# Patient Record
Sex: Female | Born: 1955 | Race: White | Hispanic: No | Smoking: Former smoker
Health system: Southern US, Community
[De-identification: ages and names within clinical notes are randomized; demographics above are authoritative.]

## PROBLEM LIST (undated history)

## (undated) DIAGNOSIS — K56609 Unspecified intestinal obstruction, unspecified as to partial versus complete obstruction: Secondary | ICD-10-CM

## (undated) DIAGNOSIS — F329 Major depressive disorder, single episode, unspecified: Secondary | ICD-10-CM

## (undated) DIAGNOSIS — G629 Polyneuropathy, unspecified: Secondary | ICD-10-CM

## (undated) DIAGNOSIS — K649 Unspecified hemorrhoids: Secondary | ICD-10-CM

## (undated) DIAGNOSIS — F319 Bipolar disorder, unspecified: Secondary | ICD-10-CM

## (undated) DIAGNOSIS — M4800 Spinal stenosis, site unspecified: Secondary | ICD-10-CM

## (undated) DIAGNOSIS — I82629 Acute embolism and thrombosis of deep veins of unspecified upper extremity: Secondary | ICD-10-CM

## (undated) DIAGNOSIS — E039 Hypothyroidism, unspecified: Secondary | ICD-10-CM

## (undated) DIAGNOSIS — M51369 Other intervertebral disc degeneration, lumbar region without mention of lumbar back pain or lower extremity pain: Secondary | ICD-10-CM

## (undated) DIAGNOSIS — E041 Nontoxic single thyroid nodule: Secondary | ICD-10-CM

## (undated) DIAGNOSIS — C210 Malignant neoplasm of anus, unspecified: Secondary | ICD-10-CM

## (undated) DIAGNOSIS — IMO0001 Reserved for inherently not codable concepts without codable children: Secondary | ICD-10-CM

## (undated) DIAGNOSIS — R251 Tremor, unspecified: Secondary | ICD-10-CM

## (undated) DIAGNOSIS — M5136 Other intervertebral disc degeneration, lumbar region: Secondary | ICD-10-CM

## (undated) DIAGNOSIS — I82409 Acute embolism and thrombosis of unspecified deep veins of unspecified lower extremity: Secondary | ICD-10-CM

## (undated) DIAGNOSIS — J45909 Unspecified asthma, uncomplicated: Secondary | ICD-10-CM

## (undated) DIAGNOSIS — F419 Anxiety disorder, unspecified: Secondary | ICD-10-CM

## (undated) DIAGNOSIS — D649 Anemia, unspecified: Secondary | ICD-10-CM

## (undated) DIAGNOSIS — G4733 Obstructive sleep apnea (adult) (pediatric): Secondary | ICD-10-CM

## (undated) DIAGNOSIS — J189 Pneumonia, unspecified organism: Secondary | ICD-10-CM

## (undated) DIAGNOSIS — F209 Schizophrenia, unspecified: Secondary | ICD-10-CM

## (undated) DIAGNOSIS — M797 Fibromyalgia: Secondary | ICD-10-CM

## (undated) DIAGNOSIS — I10 Essential (primary) hypertension: Secondary | ICD-10-CM

## (undated) DIAGNOSIS — K219 Gastro-esophageal reflux disease without esophagitis: Secondary | ICD-10-CM

## (undated) DIAGNOSIS — J449 Chronic obstructive pulmonary disease, unspecified: Secondary | ICD-10-CM

## (undated) DIAGNOSIS — F32A Depression, unspecified: Secondary | ICD-10-CM

## (undated) DIAGNOSIS — M75100 Unspecified rotator cuff tear or rupture of unspecified shoulder, not specified as traumatic: Secondary | ICD-10-CM

## (undated) DIAGNOSIS — F4310 Post-traumatic stress disorder, unspecified: Secondary | ICD-10-CM

## (undated) DIAGNOSIS — C3490 Malignant neoplasm of unspecified part of unspecified bronchus or lung: Secondary | ICD-10-CM

## (undated) HISTORY — DX: Fibromyalgia: M79.7

## (undated) HISTORY — DX: Unspecified intestinal obstruction, unspecified as to partial versus complete obstruction: K56.609

## (undated) HISTORY — DX: Spinal stenosis, site unspecified: M48.00

## (undated) HISTORY — DX: Schizophrenia, unspecified: F20.9

## (undated) HISTORY — DX: Pneumonia, unspecified organism: J18.9

## (undated) HISTORY — DX: Post-traumatic stress disorder, unspecified: F43.10

## (undated) HISTORY — DX: Chronic obstructive pulmonary disease, unspecified: J44.9

## (undated) HISTORY — DX: Bipolar disorder, unspecified: F31.9

## (undated) HISTORY — DX: Depression, unspecified: F32.A

## (undated) HISTORY — DX: Obstructive sleep apnea (adult) (pediatric): G47.33

## (undated) HISTORY — DX: Essential (primary) hypertension: I10

## (undated) HISTORY — DX: Anxiety disorder, unspecified: F41.9

## (undated) HISTORY — DX: Hypothyroidism, unspecified: E03.9

## (undated) HISTORY — DX: Anemia, unspecified: D64.9

## (undated) HISTORY — DX: Polyneuropathy, unspecified: G62.9

## (undated) HISTORY — DX: Gastro-esophageal reflux disease without esophagitis: K21.9

## (undated) HISTORY — DX: Nontoxic single thyroid nodule: E04.1

## (undated) HISTORY — DX: Other intervertebral disc degeneration, lumbar region without mention of lumbar back pain or lower extremity pain: M51.369

## (undated) HISTORY — DX: Malignant neoplasm of unspecified part of unspecified bronchus or lung: C34.90

## (undated) HISTORY — DX: Other intervertebral disc degeneration, lumbar region: M51.36

## (undated) HISTORY — DX: Major depressive disorder, single episode, unspecified: F32.9

## (undated) HISTORY — PX: EYE SURGERY: SHX253

## (undated) HISTORY — DX: Reserved for inherently not codable concepts without codable children: IMO0001

## (undated) HISTORY — PX: FOOT SURGERY: SHX648

## (undated) HISTORY — PX: TUBAL LIGATION: SHX77

## (undated) HISTORY — DX: Acute embolism and thrombosis of unspecified deep veins of unspecified lower extremity: I82.409

## (undated) HISTORY — DX: Unspecified hemorrhoids: K64.9

## (undated) HISTORY — DX: Unspecified asthma, uncomplicated: J45.909

## (undated) HISTORY — DX: Unspecified rotator cuff tear or rupture of unspecified shoulder, not specified as traumatic: M75.100

## (undated) HISTORY — DX: Tremor, unspecified: R25.1

---

## 2000-01-07 HISTORY — PX: MOUTH SURGERY: SHX715

## 2004-01-30 ENCOUNTER — Emergency Department: Payer: Self-pay | Admitting: Emergency Medicine

## 2004-03-24 ENCOUNTER — Emergency Department: Payer: Self-pay | Admitting: Emergency Medicine

## 2004-05-02 ENCOUNTER — Emergency Department: Payer: Self-pay | Admitting: Unknown Physician Specialty

## 2004-05-17 ENCOUNTER — Emergency Department: Payer: Self-pay | Admitting: Emergency Medicine

## 2004-06-23 ENCOUNTER — Emergency Department: Payer: Self-pay | Admitting: Emergency Medicine

## 2004-08-27 ENCOUNTER — Ambulatory Visit: Payer: Self-pay | Admitting: Unknown Physician Specialty

## 2004-09-11 ENCOUNTER — Ambulatory Visit: Payer: Self-pay

## 2004-09-15 ENCOUNTER — Emergency Department: Payer: Self-pay | Admitting: Unknown Physician Specialty

## 2004-09-23 ENCOUNTER — Emergency Department: Payer: Self-pay | Admitting: Emergency Medicine

## 2004-10-26 ENCOUNTER — Emergency Department: Payer: Self-pay | Admitting: Emergency Medicine

## 2004-11-26 ENCOUNTER — Emergency Department: Payer: Self-pay | Admitting: Emergency Medicine

## 2004-12-26 ENCOUNTER — Emergency Department: Payer: Self-pay | Admitting: Emergency Medicine

## 2005-01-20 ENCOUNTER — Emergency Department: Payer: Self-pay | Admitting: Emergency Medicine

## 2005-03-22 ENCOUNTER — Emergency Department: Payer: Self-pay | Admitting: Emergency Medicine

## 2005-04-05 ENCOUNTER — Emergency Department: Payer: Self-pay | Admitting: Unknown Physician Specialty

## 2005-04-22 ENCOUNTER — Emergency Department: Payer: Self-pay | Admitting: Emergency Medicine

## 2005-07-01 ENCOUNTER — Emergency Department: Payer: Self-pay | Admitting: Internal Medicine

## 2005-11-09 ENCOUNTER — Emergency Department: Payer: Self-pay | Admitting: Emergency Medicine

## 2005-11-22 ENCOUNTER — Emergency Department: Payer: Self-pay | Admitting: General Practice

## 2005-11-29 ENCOUNTER — Emergency Department: Payer: Self-pay | Admitting: Emergency Medicine

## 2005-12-15 ENCOUNTER — Ambulatory Visit: Payer: Self-pay | Admitting: Unknown Physician Specialty

## 2005-12-30 ENCOUNTER — Emergency Department: Payer: Self-pay | Admitting: General Practice

## 2006-01-28 ENCOUNTER — Emergency Department: Payer: Self-pay | Admitting: Internal Medicine

## 2007-02-05 ENCOUNTER — Emergency Department (HOSPITAL_COMMUNITY): Admission: EM | Admit: 2007-02-05 | Discharge: 2007-02-05 | Payer: Self-pay | Admitting: Emergency Medicine

## 2007-05-06 ENCOUNTER — Other Ambulatory Visit: Payer: Self-pay

## 2007-05-06 ENCOUNTER — Emergency Department: Payer: Self-pay | Admitting: Emergency Medicine

## 2007-06-23 ENCOUNTER — Emergency Department: Payer: Self-pay | Admitting: Emergency Medicine

## 2007-09-13 ENCOUNTER — Emergency Department: Payer: Self-pay | Admitting: Emergency Medicine

## 2007-09-21 ENCOUNTER — Emergency Department: Payer: Self-pay | Admitting: Emergency Medicine

## 2007-10-30 ENCOUNTER — Emergency Department: Payer: Self-pay | Admitting: Emergency Medicine

## 2008-01-04 ENCOUNTER — Emergency Department: Payer: Self-pay | Admitting: Emergency Medicine

## 2008-01-26 ENCOUNTER — Emergency Department: Payer: Self-pay | Admitting: Unknown Physician Specialty

## 2008-06-23 ENCOUNTER — Emergency Department: Payer: Self-pay | Admitting: Emergency Medicine

## 2008-06-29 ENCOUNTER — Emergency Department: Payer: Self-pay | Admitting: Emergency Medicine

## 2008-07-17 ENCOUNTER — Emergency Department: Payer: Self-pay | Admitting: Emergency Medicine

## 2008-08-11 ENCOUNTER — Emergency Department: Payer: Self-pay | Admitting: Emergency Medicine

## 2008-09-11 ENCOUNTER — Emergency Department: Payer: Self-pay | Admitting: Emergency Medicine

## 2008-12-11 ENCOUNTER — Emergency Department: Payer: Self-pay | Admitting: Emergency Medicine

## 2009-01-06 DIAGNOSIS — J189 Pneumonia, unspecified organism: Secondary | ICD-10-CM

## 2009-01-06 DIAGNOSIS — I82409 Acute embolism and thrombosis of unspecified deep veins of unspecified lower extremity: Secondary | ICD-10-CM

## 2009-01-06 HISTORY — DX: Acute embolism and thrombosis of unspecified deep veins of unspecified lower extremity: I82.409

## 2009-01-06 HISTORY — DX: Pneumonia, unspecified organism: J18.9

## 2009-01-11 ENCOUNTER — Inpatient Hospital Stay: Payer: Self-pay | Admitting: Psychiatry

## 2009-02-12 ENCOUNTER — Emergency Department: Payer: Self-pay | Admitting: Emergency Medicine

## 2009-02-14 ENCOUNTER — Emergency Department: Payer: Self-pay | Admitting: Emergency Medicine

## 2009-03-07 ENCOUNTER — Emergency Department: Payer: Self-pay | Admitting: Emergency Medicine

## 2009-04-06 ENCOUNTER — Ambulatory Visit: Payer: Self-pay | Admitting: Oncology

## 2009-04-24 ENCOUNTER — Inpatient Hospital Stay: Payer: Self-pay | Admitting: Internal Medicine

## 2009-04-28 ENCOUNTER — Ambulatory Visit: Payer: Self-pay | Admitting: Oncology

## 2009-05-03 ENCOUNTER — Ambulatory Visit: Payer: Self-pay | Admitting: Oncology

## 2009-05-06 ENCOUNTER — Ambulatory Visit: Payer: Self-pay | Admitting: Oncology

## 2009-06-06 ENCOUNTER — Ambulatory Visit: Payer: Self-pay | Admitting: Oncology

## 2009-07-06 ENCOUNTER — Emergency Department: Payer: Self-pay | Admitting: Emergency Medicine

## 2009-07-06 ENCOUNTER — Ambulatory Visit: Payer: Self-pay | Admitting: Oncology

## 2009-07-16 ENCOUNTER — Inpatient Hospital Stay: Payer: Self-pay | Admitting: Unknown Physician Specialty

## 2009-08-24 ENCOUNTER — Emergency Department: Payer: Self-pay | Admitting: Emergency Medicine

## 2009-08-30 ENCOUNTER — Emergency Department: Payer: Self-pay | Admitting: Emergency Medicine

## 2009-09-12 ENCOUNTER — Inpatient Hospital Stay: Payer: Self-pay | Admitting: Internal Medicine

## 2009-10-09 ENCOUNTER — Emergency Department: Payer: Self-pay | Admitting: Internal Medicine

## 2009-10-17 ENCOUNTER — Emergency Department: Payer: Self-pay | Admitting: Emergency Medicine

## 2009-11-04 ENCOUNTER — Emergency Department: Payer: Self-pay | Admitting: Emergency Medicine

## 2009-11-06 ENCOUNTER — Ambulatory Visit: Payer: Self-pay | Admitting: Internal Medicine

## 2009-11-09 ENCOUNTER — Inpatient Hospital Stay: Payer: Self-pay | Admitting: Psychiatry

## 2009-11-14 LAB — PROT IMMUNOELECTROPHORES(ARMC)

## 2009-11-16 ENCOUNTER — Emergency Department: Payer: Self-pay | Admitting: Emergency Medicine

## 2009-11-29 ENCOUNTER — Inpatient Hospital Stay: Payer: Self-pay | Admitting: Specialist

## 2010-01-06 ENCOUNTER — Ambulatory Visit: Payer: Self-pay | Admitting: Internal Medicine

## 2010-01-08 ENCOUNTER — Emergency Department: Payer: Self-pay | Admitting: Emergency Medicine

## 2010-01-29 ENCOUNTER — Emergency Department: Payer: Self-pay | Admitting: Emergency Medicine

## 2010-03-02 ENCOUNTER — Inpatient Hospital Stay: Payer: Self-pay | Admitting: Internal Medicine

## 2010-05-20 ENCOUNTER — Emergency Department: Payer: Self-pay | Admitting: Emergency Medicine

## 2010-07-28 ENCOUNTER — Inpatient Hospital Stay: Payer: Self-pay | Admitting: Internal Medicine

## 2010-07-30 ENCOUNTER — Inpatient Hospital Stay: Payer: Self-pay | Admitting: Unknown Physician Specialty

## 2010-08-27 ENCOUNTER — Emergency Department: Payer: Self-pay | Admitting: Emergency Medicine

## 2010-09-07 ENCOUNTER — Inpatient Hospital Stay: Payer: Self-pay | Admitting: Internal Medicine

## 2010-10-11 ENCOUNTER — Emergency Department: Payer: Self-pay | Admitting: Emergency Medicine

## 2010-11-11 ENCOUNTER — Emergency Department: Payer: Self-pay | Admitting: Emergency Medicine

## 2010-11-28 ENCOUNTER — Inpatient Hospital Stay: Payer: Self-pay | Admitting: Internal Medicine

## 2010-12-11 ENCOUNTER — Emergency Department: Payer: Self-pay | Admitting: Emergency Medicine

## 2011-03-16 ENCOUNTER — Emergency Department: Payer: Self-pay | Admitting: *Deleted

## 2011-03-16 LAB — CBC
HCT: 42.4 % (ref 35.0–47.0)
HGB: 14.4 g/dL (ref 12.0–16.0)
MCH: 30.5 pg (ref 26.0–34.0)
MCHC: 33.9 g/dL (ref 32.0–36.0)
MCV: 90 fL (ref 80–100)
Platelet: 192 10*3/uL (ref 150–440)
RDW: 14.4 % (ref 11.5–14.5)

## 2011-03-16 LAB — COMPREHENSIVE METABOLIC PANEL
Albumin: 3.4 g/dL (ref 3.4–5.0)
Alkaline Phosphatase: 68 U/L (ref 50–136)
BUN: 8 mg/dL (ref 7–18)
Bilirubin,Total: 0.6 mg/dL (ref 0.2–1.0)
Chloride: 105 mmol/L (ref 98–107)
Co2: 26 mmol/L (ref 21–32)
Creatinine: 0.92 mg/dL (ref 0.60–1.30)
EGFR (Non-African Amer.): >60
Osmolality: 283 (ref 275–301)
SGPT (ALT): 15 U/L
Sodium: 143 mmol/L (ref 136–145)

## 2011-03-16 LAB — URINALYSIS, COMPLETE
Bilirubin,UR: NEGATIVE
Ketone: NEGATIVE
Ph: 5 (ref 4.5–8.0)
Specific Gravity: 1.02 (ref 1.003–1.030)
Squamous Epithelial: <1

## 2011-03-25 ENCOUNTER — Emergency Department: Payer: Self-pay | Admitting: Emergency Medicine

## 2011-04-12 ENCOUNTER — Inpatient Hospital Stay: Payer: Self-pay | Admitting: Internal Medicine

## 2011-04-12 LAB — COMPREHENSIVE METABOLIC PANEL
Albumin: 3.2 g/dL — ABNORMAL LOW (ref 3.4–5.0)
Chloride: 109 mmol/L — ABNORMAL HIGH (ref 98–107)
Creatinine: 0.84 mg/dL (ref 0.60–1.30)
Osmolality: 280 (ref 275–301)
Potassium: 3.7 mmol/L (ref 3.5–5.1)
SGOT(AST): 30 U/L (ref 15–37)
Sodium: 141 mmol/L (ref 136–145)
Total Protein: 6.9 g/dL (ref 6.4–8.2)

## 2011-04-12 LAB — CBC
HCT: 44.6 % (ref 35.0–47.0)
HGB: 14.8 g/dL (ref 12.0–16.0)
MCH: 30 pg (ref 26.0–34.0)
MCHC: 33.1 g/dL (ref 32.0–36.0)
RDW: 14.2 % (ref 11.5–14.5)
WBC: 9.1 10*3/uL (ref 3.6–11.0)

## 2011-04-12 LAB — DRUG SCREEN, URINE
Amphetamines, Ur Screen: NEGATIVE (ref ?–1000)
Barbiturates, Ur Screen: NEGATIVE (ref ?–200)
Cocaine Metabolite,Ur ~~LOC~~: NEGATIVE (ref ?–300)
MDMA (Ecstasy)Ur Screen: NEGATIVE (ref ?–500)
Methadone, Ur Screen: NEGATIVE (ref ?–300)
Phencyclidine (PCP) Ur S: NEGATIVE (ref ?–25)
Tricyclic, Ur Screen: NEGATIVE (ref ?–1000)

## 2011-04-13 LAB — BASIC METABOLIC PANEL
Calcium, Total: 8.5 mg/dL (ref 8.5–10.1)
Co2: 27 mmol/L (ref 21–32)
Creatinine: 0.87 mg/dL (ref 0.60–1.30)
Glucose: 191 mg/dL — ABNORMAL HIGH (ref 65–99)
Osmolality: 278 (ref 275–301)
Sodium: 137 mmol/L (ref 136–145)

## 2011-04-13 LAB — CBC WITH DIFFERENTIAL/PLATELET
Basophil #: 0 10*3/uL (ref 0.0–0.1)
Basophil %: 0.3 %
Eosinophil #: 0 10*3/uL (ref 0.0–0.7)
MCH: 30 pg (ref 26.0–34.0)
MCHC: 33 g/dL (ref 32.0–36.0)
MCV: 91 fL (ref 80–100)
Monocyte #: 0.2 10*3/uL (ref 0.0–0.7)
Monocyte %: 2.1 %
Neutrophil #: 9.6 10*3/uL — ABNORMAL HIGH (ref 1.4–6.5)
WBC: 11.5 10*3/uL — ABNORMAL HIGH (ref 3.6–11.0)

## 2011-04-15 LAB — HEMOGLOBIN A1C

## 2011-05-29 ENCOUNTER — Ambulatory Visit: Payer: Self-pay | Admitting: Family Medicine

## 2011-07-02 ENCOUNTER — Encounter: Payer: Self-pay | Admitting: Family Medicine

## 2011-07-22 ENCOUNTER — Emergency Department: Payer: Self-pay | Admitting: Emergency Medicine

## 2011-10-17 ENCOUNTER — Emergency Department: Payer: Self-pay | Admitting: Emergency Medicine

## 2011-10-17 LAB — URINALYSIS, COMPLETE
Bilirubin,UR: NEGATIVE
Ph: 7 (ref 4.5–8.0)
RBC,UR: 8 /HPF (ref 0–5)
Specific Gravity: 1.021 (ref 1.003–1.030)
Squamous Epithelial: 4
WBC UR: 3 /HPF (ref 0–5)

## 2011-10-17 LAB — CBC
HCT: 47.5 % — ABNORMAL HIGH (ref 35.0–47.0)
MCH: 31.2 pg (ref 26.0–34.0)
MCHC: 34.9 g/dL (ref 32.0–36.0)
MCV: 90 fL (ref 80–100)
RDW: 14.1 % (ref 11.5–14.5)

## 2011-10-17 LAB — COMPREHENSIVE METABOLIC PANEL
Alkaline Phosphatase: 92 U/L (ref 50–136)
Bilirubin,Total: 1.4 mg/dL — ABNORMAL HIGH (ref 0.2–1.0)
Co2: 27 mmol/L (ref 21–32)
EGFR (Non-African Amer.): >60
SGPT (ALT): 19 U/L (ref 12–78)

## 2011-11-04 ENCOUNTER — Emergency Department: Payer: Self-pay | Admitting: Emergency Medicine

## 2011-11-04 LAB — CBC
MCH: 31.3 pg (ref 26.0–34.0)
MCHC: 34.2 g/dL (ref 32.0–36.0)
MCV: 92 fL (ref 80–100)
Platelet: 229 10*3/uL (ref 150–440)
RBC: 4.79 10*6/uL (ref 3.80–5.20)

## 2011-11-04 LAB — URINALYSIS, COMPLETE
Bilirubin,UR: NEGATIVE
Blood: NEGATIVE
Glucose,UR: NEGATIVE mg/dL (ref 0–75)
Specific Gravity: 1.015 (ref 1.003–1.030)
Squamous Epithelial: 5
WBC UR: 3 /HPF (ref 0–5)

## 2011-11-05 LAB — COMPREHENSIVE METABOLIC PANEL
Anion Gap: 8 (ref 7–16)
BUN: 14 mg/dL (ref 7–18)
Bilirubin,Total: 0.4 mg/dL (ref 0.2–1.0)
Chloride: 106 mmol/L (ref 98–107)
Co2: 31 mmol/L (ref 21–32)
Creatinine: 0.94 mg/dL (ref 0.60–1.30)
EGFR (African American): >60
EGFR (Non-African Amer.): >60
Potassium: 4.1 mmol/L (ref 3.5–5.1)
SGOT(AST): 30 U/L (ref 15–37)
Total Protein: 6.8 g/dL (ref 6.4–8.2)

## 2012-03-25 ENCOUNTER — Ambulatory Visit: Payer: Self-pay | Admitting: Family Medicine

## 2012-05-25 ENCOUNTER — Encounter: Payer: Self-pay | Admitting: Family Medicine

## 2012-06-06 ENCOUNTER — Encounter: Payer: Self-pay | Admitting: Family Medicine

## 2012-09-02 LAB — COMPREHENSIVE METABOLIC PANEL
BUN: 8 mg/dL (ref 7–18)
Bilirubin,Total: 0.4 mg/dL (ref 0.2–1.0)
Calcium, Total: 9.1 mg/dL (ref 8.5–10.1)
Chloride: 105 mmol/L (ref 98–107)
Co2: 24 mmol/L (ref 21–32)
Creatinine: 0.95 mg/dL (ref 0.60–1.30)
EGFR (African American): >60
Osmolality: 271 (ref 275–301)
Potassium: 3.8 mmol/L (ref 3.5–5.1)
SGPT (ALT): 23 U/L (ref 12–78)

## 2012-09-02 LAB — CBC
HCT: 45 % (ref 35.0–47.0)
MCH: 30.8 pg (ref 26.0–34.0)
Platelet: 211 10*3/uL (ref 150–440)
RDW: 13.8 % (ref 11.5–14.5)
WBC: 11.3 10*3/uL — ABNORMAL HIGH (ref 3.6–11.0)

## 2012-09-02 LAB — URINALYSIS, COMPLETE
Bacteria: NONE SEEN
Bilirubin,UR: NEGATIVE
Glucose,UR: NEGATIVE mg/dL (ref 0–75)
Leukocyte Esterase: NEGATIVE
RBC,UR: 4 /HPF (ref 0–5)

## 2012-09-02 LAB — ETHANOL: Ethanol %: <0.003 % (ref 0.000–0.080)

## 2012-09-02 LAB — DRUG SCREEN, URINE
Cocaine Metabolite,Ur ~~LOC~~: NEGATIVE (ref ?–300)
Methadone, Ur Screen: NEGATIVE (ref ?–300)

## 2012-09-02 LAB — SALICYLATE LEVEL: Salicylates, Serum: 3.3 mg/dL — ABNORMAL HIGH

## 2012-09-03 ENCOUNTER — Inpatient Hospital Stay: Payer: Self-pay | Admitting: Psychiatry

## 2012-12-01 LAB — DRUG SCREEN, URINE
Amphetamines, Ur Screen: NEGATIVE (ref ?–1000)
Barbiturates, Ur Screen: NEGATIVE (ref ?–200)
Methadone, Ur Screen: NEGATIVE (ref ?–300)
Opiate, Ur Screen: NEGATIVE (ref ?–300)
Phencyclidine (PCP) Ur S: NEGATIVE (ref ?–25)

## 2012-12-01 LAB — URINALYSIS, COMPLETE
Bilirubin,UR: NEGATIVE
Ph: 6 (ref 4.5–8.0)
RBC,UR: 7 /HPF (ref 0–5)
Specific Gravity: 1.017 (ref 1.003–1.030)
Squamous Epithelial: 3
WBC UR: 7 /HPF (ref 0–5)

## 2012-12-01 LAB — CBC
HCT: 45.5 % (ref 35.0–47.0)
HGB: 15.4 g/dL (ref 12.0–16.0)
MCH: 30.5 pg (ref 26.0–34.0)
MCHC: 33.8 g/dL (ref 32.0–36.0)
MCV: 90 fL (ref 80–100)
Platelet: 203 10*3/uL (ref 150–440)
RDW: 13.7 % (ref 11.5–14.5)

## 2012-12-01 LAB — COMPREHENSIVE METABOLIC PANEL
Alkaline Phosphatase: 59 U/L
Co2: 26 mmol/L (ref 21–32)
Creatinine: 0.91 mg/dL (ref 0.60–1.30)
EGFR (Non-African Amer.): >60
Potassium: 3.8 mmol/L (ref 3.5–5.1)
Sodium: 138 mmol/L (ref 136–145)
Total Protein: 7.3 g/dL (ref 6.4–8.2)

## 2012-12-01 LAB — SALICYLATE LEVEL: Salicylates, Serum: 3.7 mg/dL — ABNORMAL HIGH

## 2012-12-01 LAB — ETHANOL: Ethanol %: <0.003 % (ref 0.000–0.080)

## 2012-12-02 ENCOUNTER — Inpatient Hospital Stay: Payer: Self-pay | Admitting: Psychiatry

## 2013-03-08 ENCOUNTER — Emergency Department: Payer: Self-pay | Admitting: Emergency Medicine

## 2013-04-29 ENCOUNTER — Ambulatory Visit: Payer: Self-pay | Admitting: Family Medicine

## 2013-05-01 ENCOUNTER — Emergency Department: Payer: Self-pay

## 2013-05-01 LAB — BASIC METABOLIC PANEL
Anion Gap: 8 (ref 7–16)
BUN: 13 mg/dL (ref 7–18)
CHLORIDE: 106 mmol/L (ref 98–107)
Calcium, Total: 9 mg/dL (ref 8.5–10.1)
Co2: 25 mmol/L (ref 21–32)
Creatinine: 0.89 mg/dL (ref 0.60–1.30)
EGFR (African American): >60
GLUCOSE: 111 mg/dL — AB (ref 65–99)
Osmolality: 278 (ref 275–301)
Potassium: 3.7 mmol/L (ref 3.5–5.1)
Sodium: 139 mmol/L (ref 136–145)

## 2013-05-01 LAB — CBC
HCT: 44.4 % (ref 35.0–47.0)
HGB: 14.9 g/dL (ref 12.0–16.0)
MCH: 30.4 pg (ref 26.0–34.0)
MCHC: 33.5 g/dL (ref 32.0–36.0)
MCV: 91 fL (ref 80–100)
Platelet: 204 10*3/uL (ref 150–440)
RBC: 4.89 10*6/uL (ref 3.80–5.20)
RDW: 13.6 % (ref 11.5–14.5)
WBC: 10.7 10*3/uL (ref 3.6–11.0)

## 2013-05-17 ENCOUNTER — Emergency Department: Payer: Self-pay | Admitting: Emergency Medicine

## 2013-06-19 ENCOUNTER — Inpatient Hospital Stay: Payer: Self-pay | Admitting: Psychiatry

## 2013-06-19 LAB — COMPREHENSIVE METABOLIC PANEL
Albumin: 3 g/dL — ABNORMAL LOW (ref 3.4–5.0)
Alkaline Phosphatase: 61 U/L
Anion Gap: 8 (ref 7–16)
BILIRUBIN TOTAL: 0.6 mg/dL (ref 0.2–1.0)
BUN: 10 mg/dL (ref 7–18)
CREATININE: 1.11 mg/dL (ref 0.60–1.30)
Calcium, Total: 8.6 mg/dL (ref 8.5–10.1)
Chloride: 106 mmol/L (ref 98–107)
Co2: 25 mmol/L (ref 21–32)
EGFR (African American): >60
EGFR (Non-African Amer.): 55 — ABNORMAL LOW
Glucose: 89 mg/dL (ref 65–99)
Osmolality: 276 (ref 275–301)
Potassium: 3.6 mmol/L (ref 3.5–5.1)
SGOT(AST): 27 U/L (ref 15–37)
SGPT (ALT): 16 U/L (ref 12–78)
Sodium: 139 mmol/L (ref 136–145)
Total Protein: 7.1 g/dL (ref 6.4–8.2)

## 2013-06-19 LAB — DRUG SCREEN, URINE
AMPHETAMINES, UR SCREEN: NEGATIVE (ref ?–1000)
BENZODIAZEPINE, UR SCRN: NEGATIVE (ref ?–200)
Barbiturates, Ur Screen: NEGATIVE (ref ?–200)
CANNABINOID 50 NG, UR ~~LOC~~: POSITIVE (ref ?–50)
COCAINE METABOLITE, UR ~~LOC~~: NEGATIVE (ref ?–300)
MDMA (Ecstasy)Ur Screen: NEGATIVE (ref ?–500)
METHADONE, UR SCREEN: NEGATIVE (ref ?–300)
OPIATE, UR SCREEN: NEGATIVE (ref ?–300)
PHENCYCLIDINE (PCP) UR S: NEGATIVE (ref ?–25)
Tricyclic, Ur Screen: NEGATIVE (ref ?–1000)

## 2013-06-19 LAB — URINALYSIS, COMPLETE
BACTERIA: NONE SEEN
Bilirubin,UR: NEGATIVE
Blood: NEGATIVE
GLUCOSE, UR: NEGATIVE mg/dL (ref 0–75)
Ketone: NEGATIVE
NITRITE: NEGATIVE
Ph: 6 (ref 4.5–8.0)
Protein: NEGATIVE
RBC,UR: 7 /HPF (ref 0–5)
Specific Gravity: 1.017 (ref 1.003–1.030)
WBC UR: 1 /HPF (ref 0–5)

## 2013-06-19 LAB — ETHANOL: Ethanol %: <0.003 % (ref 0.000–0.080)

## 2013-06-19 LAB — CBC
HCT: 43.3 % (ref 35.0–47.0)
HGB: 14.6 g/dL (ref 12.0–16.0)
MCH: 30.5 pg (ref 26.0–34.0)
MCHC: 33.7 g/dL (ref 32.0–36.0)
MCV: 91 fL (ref 80–100)
Platelet: 205 10*3/uL (ref 150–440)
RBC: 4.78 10*6/uL (ref 3.80–5.20)
RDW: 14.1 % (ref 11.5–14.5)
WBC: 9.9 10*3/uL (ref 3.6–11.0)

## 2013-06-19 LAB — ACETAMINOPHEN LEVEL

## 2013-06-19 LAB — SALICYLATE LEVEL: SALICYLATES, SERUM: 3.5 mg/dL — AB

## 2013-07-18 ENCOUNTER — Ambulatory Visit: Payer: Self-pay | Admitting: Ophthalmology

## 2013-08-03 ENCOUNTER — Emergency Department: Payer: Self-pay | Admitting: Emergency Medicine

## 2013-08-03 LAB — URINALYSIS, COMPLETE
BACTERIA: NONE SEEN
Bilirubin,UR: NEGATIVE
Glucose,UR: NEGATIVE mg/dL (ref 0–75)
Ketone: NEGATIVE
LEUKOCYTE ESTERASE: NEGATIVE
NITRITE: NEGATIVE
PH: 6 (ref 4.5–8.0)
Protein: NEGATIVE
RBC,UR: 2 /HPF (ref 0–5)
Specific Gravity: 1.005 (ref 1.003–1.030)

## 2013-08-03 LAB — COMPREHENSIVE METABOLIC PANEL
ALT: 16 U/L
ANION GAP: 9 (ref 7–16)
Albumin: 2.9 g/dL — ABNORMAL LOW (ref 3.4–5.0)
Alkaline Phosphatase: 58 U/L
BUN: 7 mg/dL (ref 7–18)
Bilirubin,Total: 0.7 mg/dL (ref 0.2–1.0)
CALCIUM: 8.4 mg/dL — AB (ref 8.5–10.1)
CHLORIDE: 108 mmol/L — AB (ref 98–107)
CREATININE: 0.99 mg/dL (ref 0.60–1.30)
Co2: 25 mmol/L (ref 21–32)
EGFR (Non-African Amer.): >60
GLUCOSE: 106 mg/dL — AB (ref 65–99)
Osmolality: 282 (ref 275–301)
POTASSIUM: 3.2 mmol/L — AB (ref 3.5–5.1)
SGOT(AST): 24 U/L (ref 15–37)
SODIUM: 142 mmol/L (ref 136–145)
Total Protein: 7.1 g/dL (ref 6.4–8.2)

## 2013-08-03 LAB — DRUG SCREEN, URINE
AMPHETAMINES, UR SCREEN: NEGATIVE (ref ?–1000)
BENZODIAZEPINE, UR SCRN: NEGATIVE (ref ?–200)
Barbiturates, Ur Screen: NEGATIVE (ref ?–200)
Cannabinoid 50 Ng, Ur ~~LOC~~: POSITIVE (ref ?–50)
Cocaine Metabolite,Ur ~~LOC~~: NEGATIVE (ref ?–300)
MDMA (ECSTASY) UR SCREEN: NEGATIVE (ref ?–500)
METHADONE, UR SCREEN: NEGATIVE (ref ?–300)
Opiate, Ur Screen: NEGATIVE (ref ?–300)
PHENCYCLIDINE (PCP) UR S: NEGATIVE (ref ?–25)
TRICYCLIC, UR SCREEN: NEGATIVE (ref ?–1000)

## 2013-08-03 LAB — SALICYLATE LEVEL: SALICYLATES, SERUM: 3.5 mg/dL — AB

## 2013-08-03 LAB — ACETAMINOPHEN LEVEL

## 2013-08-03 LAB — CBC
HCT: 42.9 % (ref 35.0–47.0)
HGB: 14.1 g/dL (ref 12.0–16.0)
MCH: 29.9 pg (ref 26.0–34.0)
MCHC: 32.7 g/dL (ref 32.0–36.0)
MCV: 91 fL (ref 80–100)
PLATELETS: 194 10*3/uL (ref 150–440)
RBC: 4.71 10*6/uL (ref 3.80–5.20)
RDW: 14.5 % (ref 11.5–14.5)
WBC: 12.4 10*3/uL — ABNORMAL HIGH (ref 3.6–11.0)

## 2013-08-03 LAB — ETHANOL: Ethanol %: <0.003 % (ref 0.000–0.080)

## 2013-08-22 ENCOUNTER — Emergency Department: Payer: Self-pay | Admitting: Emergency Medicine

## 2013-08-22 LAB — CBC WITH DIFFERENTIAL/PLATELET
BASOS ABS: 0.1 10*3/uL (ref 0.0–0.1)
BASOS PCT: 1.1 %
EOS ABS: 0.3 10*3/uL (ref 0.0–0.7)
Eosinophil %: 3.8 %
HCT: 43.1 % (ref 35.0–47.0)
HGB: 13.9 g/dL (ref 12.0–16.0)
Lymphocyte #: 2.4 10*3/uL (ref 1.0–3.6)
Lymphocyte %: 30.3 %
MCH: 29.8 pg (ref 26.0–34.0)
MCHC: 32.3 g/dL (ref 32.0–36.0)
MCV: 92 fL (ref 80–100)
MONO ABS: 0.5 x10 3/mm (ref 0.2–0.9)
Monocyte %: 6.4 %
Neutrophil #: 4.7 10*3/uL (ref 1.4–6.5)
Neutrophil %: 58.4 %
PLATELETS: 190 10*3/uL (ref 150–440)
RBC: 4.67 10*6/uL (ref 3.80–5.20)
RDW: 14.7 % — ABNORMAL HIGH (ref 11.5–14.5)
WBC: 8 10*3/uL (ref 3.6–11.0)

## 2013-08-22 LAB — BASIC METABOLIC PANEL
ANION GAP: 7 (ref 7–16)
BUN: 7 mg/dL (ref 7–18)
CHLORIDE: 110 mmol/L — AB (ref 98–107)
Calcium, Total: 8.4 mg/dL — ABNORMAL LOW (ref 8.5–10.1)
Co2: 27 mmol/L (ref 21–32)
Creatinine: 0.93 mg/dL (ref 0.60–1.30)
Glucose: 95 mg/dL (ref 65–99)
Osmolality: 285 (ref 275–301)
Potassium: 3.6 mmol/L (ref 3.5–5.1)
Sodium: 144 mmol/L (ref 136–145)

## 2013-09-13 ENCOUNTER — Ambulatory Visit: Payer: Self-pay | Admitting: Ophthalmology

## 2013-09-15 ENCOUNTER — Emergency Department: Payer: Self-pay | Admitting: Emergency Medicine

## 2013-09-15 LAB — COMPREHENSIVE METABOLIC PANEL
Albumin: 2.9 g/dL — ABNORMAL LOW (ref 3.4–5.0)
Alkaline Phosphatase: 58 U/L
Anion Gap: 6 — ABNORMAL LOW (ref 7–16)
BUN: 6 mg/dL — ABNORMAL LOW (ref 7–18)
Bilirubin,Total: 0.6 mg/dL (ref 0.2–1.0)
Calcium, Total: 8.7 mg/dL (ref 8.5–10.1)
Chloride: 108 mmol/L — ABNORMAL HIGH (ref 98–107)
Co2: 27 mmol/L (ref 21–32)
Creatinine: 0.99 mg/dL (ref 0.60–1.30)
EGFR (African American): >60
EGFR (Non-African Amer.): >60
Glucose: 93 mg/dL (ref 65–99)
Osmolality: 279 (ref 275–301)
Potassium: 3.6 mmol/L (ref 3.5–5.1)
SGOT(AST): 26 U/L (ref 15–37)
SGPT (ALT): 15 U/L
Sodium: 141 mmol/L (ref 136–145)
Total Protein: 7 g/dL (ref 6.4–8.2)

## 2013-09-15 LAB — CBC
HCT: 44.3 % (ref 35.0–47.0)
HGB: 14.5 g/dL (ref 12.0–16.0)
MCH: 29.8 pg (ref 26.0–34.0)
MCHC: 32.7 g/dL (ref 32.0–36.0)
MCV: 91 fL (ref 80–100)
Platelet: 205 10*3/uL (ref 150–440)
RBC: 4.86 10*6/uL (ref 3.80–5.20)
RDW: 14.8 % — ABNORMAL HIGH (ref 11.5–14.5)
WBC: 9.9 10*3/uL (ref 3.6–11.0)

## 2013-09-21 ENCOUNTER — Emergency Department: Payer: Self-pay | Admitting: Emergency Medicine

## 2013-10-17 ENCOUNTER — Ambulatory Visit: Payer: Self-pay | Admitting: Ophthalmology

## 2013-10-19 ENCOUNTER — Emergency Department: Payer: Self-pay | Admitting: Emergency Medicine

## 2013-10-19 LAB — URINALYSIS, COMPLETE
BACTERIA: NONE SEEN
BILIRUBIN, UR: NEGATIVE
Glucose,UR: NEGATIVE mg/dL (ref 0–75)
Leukocyte Esterase: NEGATIVE
NITRITE: NEGATIVE
Ph: 6 (ref 4.5–8.0)
Protein: NEGATIVE
RBC,UR: 2 /HPF (ref 0–5)
Specific Gravity: 1.012 (ref 1.003–1.030)

## 2013-12-15 ENCOUNTER — Emergency Department: Payer: Self-pay | Admitting: Student

## 2013-12-16 ENCOUNTER — Emergency Department: Payer: Self-pay | Admitting: Emergency Medicine

## 2013-12-23 ENCOUNTER — Emergency Department: Payer: Self-pay | Admitting: Emergency Medicine

## 2013-12-25 ENCOUNTER — Emergency Department: Payer: Self-pay | Admitting: Emergency Medicine

## 2013-12-25 LAB — URINALYSIS, COMPLETE
Glucose,UR: NEGATIVE mg/dL (ref 0–75)
Ketone: NEGATIVE
Nitrite: NEGATIVE
Ph: 5 (ref 4.5–8.0)
Protein: NEGATIVE
RBC,UR: 49 /HPF (ref 0–5)
SPECIFIC GRAVITY: 1.017 (ref 1.003–1.030)
WBC UR: 71 /HPF (ref 0–5)

## 2013-12-25 LAB — COMPREHENSIVE METABOLIC PANEL
Albumin: 2.2 g/dL — ABNORMAL LOW (ref 3.4–5.0)
Alkaline Phosphatase: 98 U/L
Anion Gap: 6 — ABNORMAL LOW (ref 7–16)
BILIRUBIN TOTAL: 0.6 mg/dL (ref 0.2–1.0)
BUN: 11 mg/dL (ref 7–18)
CHLORIDE: 107 mmol/L (ref 98–107)
CO2: 29 mmol/L (ref 21–32)
Calcium, Total: 8.1 mg/dL — ABNORMAL LOW (ref 8.5–10.1)
Creatinine: 0.8 mg/dL (ref 0.60–1.30)
EGFR (African American): >60
Glucose: 88 mg/dL (ref 65–99)
OSMOLALITY: 282 (ref 275–301)
Potassium: 3.5 mmol/L (ref 3.5–5.1)
SGOT(AST): 28 U/L (ref 15–37)
SGPT (ALT): 11 U/L — ABNORMAL LOW
SODIUM: 142 mmol/L (ref 136–145)
Total Protein: 6.5 g/dL (ref 6.4–8.2)

## 2013-12-25 LAB — CBC
HCT: 45.3 % (ref 35.0–47.0)
HGB: 14.5 g/dL (ref 12.0–16.0)
MCH: 29.1 pg (ref 26.0–34.0)
MCHC: 31.9 g/dL — ABNORMAL LOW (ref 32.0–36.0)
MCV: 91 fL (ref 80–100)
Platelet: 256 10*3/uL (ref 150–440)
RBC: 4.97 10*6/uL (ref 3.80–5.20)
RDW: 15.3 % — AB (ref 11.5–14.5)
WBC: 11.8 10*3/uL — ABNORMAL HIGH (ref 3.6–11.0)

## 2013-12-25 LAB — DRUG SCREEN, URINE
Amphetamines, Ur Screen: NEGATIVE (ref ?–1000)
BARBITURATES, UR SCREEN: NEGATIVE (ref ?–200)
BENZODIAZEPINE, UR SCRN: NEGATIVE (ref ?–200)
CANNABINOID 50 NG, UR ~~LOC~~: POSITIVE (ref ?–50)
Cocaine Metabolite,Ur ~~LOC~~: NEGATIVE (ref ?–300)
MDMA (ECSTASY) UR SCREEN: NEGATIVE (ref ?–500)
Methadone, Ur Screen: NEGATIVE (ref ?–300)
OPIATE, UR SCREEN: POSITIVE (ref ?–300)
PHENCYCLIDINE (PCP) UR S: NEGATIVE (ref ?–25)
Tricyclic, Ur Screen: NEGATIVE (ref ?–1000)

## 2013-12-25 LAB — ETHANOL: Ethanol: <3 mg/dL

## 2013-12-25 LAB — ACETAMINOPHEN LEVEL: Acetaminophen: <2 ug/mL

## 2013-12-25 LAB — SALICYLATE LEVEL: Salicylates, Serum: 2.5 mg/dL

## 2014-04-28 NOTE — H&P (Signed)
PATIENT NAME:  [name], SENAT MR#:  [id number] DATE OF BIRTH:  [birth year]  DATE OF ADMISSION:  09/03/2012.  DATE OF ASSESSMENT:  09/04/2012.  IDENTIFYING INFORMATION AND CHIEF COMPLAINT:  This is a 59 year old woman with a history of major depression, admitted through the Emergency Room after taking an overdose of medication.   CHIEF COMPLAINT:  "I think everything is going to be okay".   HISTORY OF PRESENT ILLNESS:  Information obtained from the patient and the chart. She was brought into the Emergency Room after taking an overdose of tramadol and Lyrica, Cymbalta and (Dictation Anomaly) <<T>>. She apparently got help for it as soon as she took the overdose. She said that she has been upset and angry at home recently. She cannot get along with her daughter or granddaughter. She feels like there is constant fussing at home. Her depression has been nagging her, and she has been having fatigue, poor sleep, worsening pain chronically, anxiety, started having some suicidal thoughts, but was not going to act on them until she got in an argument with her daughter.   PAST PSYCHIATRIC HISTORY:  A long history of depression, currently followed by Dr. Kasandra Knudsen. The patient has had inpatient psychiatric hospitalizations in the past and a previous history of overdose attempts similar to this one. She does not report history of ongoing psychotic symptoms. Does not report a history of violence.   PAST MEDICAL HISTORY:  The patient has COPD, also has chronic pain and is overweight. She feels like there are excessive demands on her at home that have exacerbated her pain and made her feel worse. She says she has been compliant with her medicine.   SOCIAL HISTORY:  The patient is living with her daughter and granddaughter and it sounds like there are probably other people in the house as well. The situation is very stressful for her. On the other hand, the patient now says she has developed a plan to move out of the  house after Christmas and move into an assisted living facility, which she thinks will be a big improvement.   FAMILY HISTORY:  Positive for depression.   SUBSTANCE ABUSE HISTORY:  Has a history of marijuana abuse in the past. Currently has not been using marijuana or other drugs.   CURRENT MEDICATIONS: 1.  Percocet 5 mg strength 1 every 6 hours as needed for pain.  2.  Ciprofloxacin 500 mg twice a day.  3.  Ibuprofen 800 mg 3 times a day.  4.  Spiriva 18 mcg once a day.  5.  Lexapro 10 mg once a day.  6.  Zantac 150 mg once a day.  7.  Oxybutynin 5 mg twice a day.  8.  Baclofen 10 mg 3 times a day. 9.  Albuterol 2 puffs every 4 hours as needed.  10.  Clonazepam 1 mg 3 times a day.  11.  Cymbalta 20 mg once a day.  12.  Symbicort inhaler 2 puffs 2 times a day as needed.   ALLERGIES:  SULFA DRUGS.   REVIEW OF SYSTEMS:  Depressed mood and anxiety chronically with recent exacerbation. Fatigue. Chronic pain, chronic shortness of breath. Denies acute suicidal ideation. Denies homicidal ideation. Denies psychotic symptoms.   MENTAL STATUS EXAMINATION:  A casually dressed woman, looks her stated age, cooperative with the interview. Good eye contact. Normal psychomotor activity. Speech normal rate, tone and volume. Affect is upbeat, smiling, and actually quite pleasant. Mood stated as being better. Thoughts are lucid without loosening  of associations or delusions. Denies auditory or visual hallucinations. Denies suicidal or homicidal ideation. Shows improved insight and judgment. Normal intelligence. Alert and oriented x 4.   PHYSICAL EXAMINATION:  GENERAL:  Overweight woman in no particular acute distress.  SKIN:  No skin lesions identified.  HEENT:  Pupils equal and reactive. Face symmetric.  MUSCULOSKELETAL:  Full range of motion at all extremities. Slow gait. Normal strength and reflexes throughout. Back mildly tender throughout to pain.  LUNGS:  Clear without wheezes.  HEART:  Regular  rate and rhythm.  ABDOMEN:  Soft, nontender, normal bowel sounds.  VITAL SIGNS:  Most recent, temperature 98, pulse 71, respirations 20, blood pressure 124/79.   LABORATORY RESULTS:  Drug screen negative, TSH normal at 1.6. Alcohol negative. Chemistry panel normal. CBC:  Elevated white count 11.3, otherwise unremarkable. Urinalysis negative.   ASSESSMENT:  A 59 year old woman, with chronic depression, borderline features and significant social stress, took an overdose of her prescription medicine. Since coming into the hospital, she reports that she is feeling better. Not reporting psychotic symptoms. Affect is brighter. Physical symptoms under control. She is optimistic about her future.   TREATMENT PLAN:  The patient was admitted to Psychiatry and was continued on her medicine but her Cymbalta was increased to 60 mg a day. Usual inhalers continued. Clonazepam continued 1 mg t.i.d. The patient will be engaged in groups and activities. Psychoeducation and supportive therapy done. Work on insuring she has appropriate outpatient services and a safe discharge plan.   DIAGNOSIS, PRINCIPAL AND PRIMARY:  AXIS I: Depression, not otherwise specified.   SECONDARY DIAGNOSES:  AXIS I: No further.  AXIS II: Borderline traits.  AXIS III: Overweight, chronic obstructive pulmonary disease, chronic pain.  AXIS IV: Moderate to severe from dysfunction in the home.  AXIS V: Functioning at time of evaluation 35.  ____________________________ Gonzella Lex, MD jtc:jm D: 09/04/2012 10:01:03 ET T: 09/04/2012 10:44:22 ET JOB#: [id number]  cc: Gonzella Lex, MD, <Dictator> Gonzella Lex MD ELECTRONICALLY SIGNED 09/07/2012 23:01

## 2014-04-28 NOTE — Consult Note (Signed)
Brief Consult Note: Diagnosis: Major depressive disorder.   Patient was seen by consultant.   Recommend further assessment or treatment.   Orders entered.   Comments: Will admit to psychiatry.  Electronic Signatures: Orson Slick (MD)  (Signed 29-Aug-14 12:28)  Authored: Brief Consult Note   Last Updated: 29-Aug-14 12:28 by Orson Slick (MD)

## 2014-04-29 NOTE — Consult Note (Signed)
Brief Consult Note: Diagnosis: mmajor depresssion.   Patient was seen by consultant.   Consult note dictated.   Orders entered.   Comments: PSychiatry: Note done. Denies any active suicidal intent. Will DC the IVC and increase lexapro to '20mg'$  and can then be dischaged.  Electronic Signatures: Gonzella Lex (MD)  (Signed 21-Dec-15 14:58)  Authored: Brief Consult Note   Last Updated: 21-Dec-15 14:58 by Gonzella Lex (MD)

## 2014-04-29 NOTE — Op Note (Signed)
PATIENT NAME:  [name], MACLIN MR#:  [id number] DATE OF BIRTH:  Apr 22, 1955  DATE OF PROCEDURE:  10/17/2013  PREOPERATIVE DIAGNOSIS: Cataract left eye.   POSTOPERATIVE DIAGNOSIS: Cataract left eye.   PROCEDURE PERFORMED: Extracapsular cataract extraction using phacoemulsification with placement of Alcon SN6CWF 20.0 diopter posterior chamber lens, serial number [id number].   SURGEON:  Loura Back. Jovonni Borquez, MD  ANESTHESIA: 4% lidocaine and 0.75% Marcaine 50-50 mixture with 10 units/mL of Hylenex added given as peribulbar.   ANESTHESIOLOGIST: Dr. Kayleen Memos.   COMPLICATIONS: None.   ESTIMATED BLOOD LOSS: Less than 1 mL.   DESCRIPTION OF PROCEDURE:  The patient was brought to the operating room and given a peribulbar block.  The patient was then prepped and draped in the usual fashion.  The vertical rectus muscles were imbricated using 5-0 silk sutures.  These sutures were then clamped to the sterile drapes as bridle sutures.  A limbal peritomy was performed extending two clock hours and hemostasis was obtained with cautery.  A partial thickness scleral groove was made at the surgical limbus and dissected anteriorly in a lamellar dissection using an Alcon crescent knife.  The anterior chamber was entered supero-temporally with a Superblade and through the lamellar dissection with a 2.6 mm keratome.  DisCoVisc was used to replace the aqueous and a continuous tear capsulorrhexis was carried out.  Hydrodissection and hydrodelineation were carried out with balanced salt and a 27 gauge canula.  The nucleus was rotated to confirm the effectiveness of the hydrodissection.  Phacoemulsification was carried out using a divide-and-conquer technique.  Total ultrasound time was 1 minute and 10.7 seconds with an average power of 24 percent.  CDE of 30.15.  Irrigation/aspiration was used to remove the residual cortex.  DisCoVisc was used to inflate the capsule and the internal incision was enlarged to 3 mm  with the crescent knife.  The intraocular lens was folded and inserted into the capsular bag using the AcrySert delivery system.  Irrigation/aspiration was used to remove the residual DisCoVisc.  Miostat was injected into the anterior chamber through the paracentesis track to inflate the anterior chamber and induce miosis.  0.1 mL of cefuroxime containing 1 mg of drug was injected via the paracentesis track. The wound was checked for leaks and none were found. The conjunctiva was closed with cautery and the bridle sutures were removed.  Two drops of 0.3% Vigamox were placed on the eye.   An eye shield was placed on the eye.  The patient was discharged to the recovery room in good condition.    ____________________________ Loura Back Boomer Winders, MD sad:bu D: 10/17/2013 12:36:23 ET T: 10/17/2013 12:57:35 ET JOB#: [id number]  cc: Remo Lipps A. Kirstie Larsen, MD, <Dictator> Martie Lee MD ELECTRONICALLY SIGNED 10/24/2013 13:33
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
## 2014-04-29 NOTE — Discharge Summary (Signed)
PATIENT NAME:  [name], [name] MR#:  [id number] DATE OF BIRTH:  1955-08-11  DATE OF ADMISSION:  06/19/2013 DATE OF DISCHARGE:    HOSPITAL COURSE: See dictated history and physical for details of admission. A 59 year old woman with a long-standing history of depression and anxiety, who came into the hospital reporting that she was having intrusive thoughts about killing herself. She was having increased stress and anxiety related largely to her home situation with her family. Since being in the hospital, she has not shown any dangerous or aggressive behavior. She has been cooperative and appropriate in her interactions and has attended groups and individual therapy. She appears to have good insight. The patient is stating now that she no longer has any suicidal thoughts at all. Her mood is feeling more upbeat and hopeful. The only medication changes introduced were an increase of her Lexapro to 20 mg a day. Otherwise, she is still on her usual psychiatric medicine. The patient has been encouraged to talk to her outpatient psychiatrist about seeing a therapist and we have left that message at [facility] as well. She is to follow up there for medication management and for therapy. Additionally, the patient complained of a severe toothache this morning. She does have very bad looking dentition with one broken off blackened tooth that she was pointing at. I have given her penicillin V 500 q.6 hours for the next 7 days and 3 days worth of Percocet, but instructed her that she needs to follow up with a dentist at her earliest opportunity. Currently no acute dangerousness. She agrees to outpatient plan. Discharged from the hospital.   [place]: Casually dressed and groomed woman, looks her stated age, cooperative with the interview. Good eye contact. Normal psychomotor activity. Speech normal rate, tone and volume. Affect is slightly dysphoric, which she relates to her toothache. She  states that her mood is good. Thoughts are lucid without loosening of associations. No evidence of delusions. Denies auditory or visual hallucinations. Denies suicidal or homicidal ideation. Shows improved insight and judgment. Normal intelligence. Short and long-term memory intact. Alert and oriented x 4.   DISCHARGE MEDICATIONS: Etodolac 500 mg twice a day; tramadol 50 mg every 6 hours as needed for chronic pain; Lexapro 20 mg once a day; simvastatin 40 mg at night; clonazepam 1 mg 3 times a day; budesonide/formoterol 160 mcg/4.5 mcg 2 puffs inhaled 2 times a day; Spiriva HandiHaler 18 mcg once a day; Zantac 150 mg twice a day; oxybutynin 10 mg long acting once a day; calcium with vitamin D 500 mg twice a day; Percocet 10 mg strength 1 every 6 hours as needed for toothache pain with only 2 days beyond the hospitalization and penicillin VK 500 mg every 6 hours with 7 days given.   LABORATORY RESULTS: Admission labs included elevated salicylates at 3.5. Alcohol negative. Chemistry panel unremarkable. CBC normal. Urinalysis, possibly contaminated not obviously infected. Drug screen positive for cannabis.   DISPOSITION: The patient will be discharged back to her own home and follow up with [facility].   DIAGNOSIS, PRINCIPAL AND PRIMARY:  AXIS I: Major depression, recurrent, severe.  SECONDARY DIAGNOSES:  AXIS I: No further.  AXIS II: Borderline traits at least.  AXIS III: Chronic obstructive pulmonary disease, chronic pain, hypertension, acute toothache and gastric reflux disease.  AXIS IV: Chronic severe social stress and lack of resources.  AXIS V: Functioning at time of discharge 60.  ____________________________ Gonzella Lex, MD jtc:aw D: 06/21/2013 11:10:08 ET  T: 06/21/2013 11:35:01 ET JOB#: [id number]  cc: Gonzella Lex, MD, <Dictator> Gonzella Lex MD ELECTRONICALLY SIGNED 06/22/2013 13:24

## 2014-04-29 NOTE — Op Note (Signed)
PATIENT NAME:  [name], BRANCA MR#:  [id number] DATE OF BIRTH:  [birth year]  DATE OF PROCEDURE: 09/13/2013  DATE OF DICTATION: 09/13/2013   PREOPERATIVE DIAGNOSIS: Cataract, right eye.   POSTOPERATIVE DIAGNOSIS: Cataract, right eye.   PROCEDURE PERFORMED: Extracapsular cataract extraction using phacoemulsification with placement of Alcon SN6CWS 19.5 diopter posterior chamber lens, serial number [id number].   SURGEON: Loura Back. Kinya Meine, MD   ASSISTANT: None.  ANESTHESIA: Lidocaine 4% and 0.75% Marcaine in a 50:50 mixture with 10 units/mL of Hylenex added given as a peribulbar.   ANESTHESIOLOGIST: Alvin Critchley, MD   COMPLICATIONS: None.   ESTIMATED BLOOD LOSS: Less than 1 mL.   DESCRIPTION OF PROCEDURE: The patient was brought to the operating room and given a peribulbar block. The patient was then prepped and draped in the usual fashion. The vertical rectus muscles were imbricated using 5-0 silk sutures. These sutures were then clamped to the sterile drapes as bridle sutures. A limbal peritomy was performed extending two clock hours and hemostasis was obtained with cautery.  A partial thickness scleral groove was made at the surgical limbus and dissected anteriorly in a lamellar dissection using an Alcon crescent knife. The anterior chamber was entered superonasally with a Superblade and through the lamellar dissection with a 2.6 mm keratome.  DisCoVisc was used to replace the aqueous and a continuous tear capsulorrhexis was carried out. Hydrodissection and hydrodelineation were carried out with balanced salt and a 27 gauge canula. The nucleus was rotated to confirm the effectiveness of the hydrodissection. Phacoemulsification was carried out using a divide-and-conquer technique. Total ultrasound time was 1 minute and 6.6 seconds with an average power of 23.6%, a CDE of 24.63.   Irrigation/aspiration was used to remove the residual cortex. DisCoVisc was used to inflate the capsule  and the internal incision was enlarged to 3 mm with the crescent knife. The intraocular lens was folded and inserted into the capsular bag using the AcrySert delivery system. Irrigation/aspiration was used to remove the residual DisCoVisc.  Miostat was injected into the anterior chamber through the paracentesis track to inflate the anterior chamber and induce miosis. Cefuroxime 0.1 mL containing 1 mg of drug was injected via the paracentesis track. The wound was checked for leaks and none were found. The conjunctiva was closed with cautery and the bridle sutures were removed. Two drops of 0.3% Vigamox were placed on the eye.  An eye shield was placed on the eye.  The patient was discharged to the recovery room in good condition.   ____________________________ Loura Back Cadon Raczka, MD sad:lb D: 09/13/2013 11:12:09 ET T: 09/13/2013 11:28:35 ET JOB#: [id number]  cc: Remo Lipps A. Leonia Heatherly, MD, <Dictator> Martie Lee MD ELECTRONICALLY SIGNED 09/19/2013 14:32
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
## 2014-04-30 NOTE — Discharge Summary (Signed)
PATIENT NAME:  [name], [name] MR#:  [id number] DATE OF BIRTH:  1955-02-09  DATE OF ADMISSION:  04/12/2011 DATE OF DISCHARGE:  04/14/2011  ADMITTING DIAGNOSIS: Chronic obstructive pulmonary disease exacerbation.   DISCHARGE DIAGNOSES:  1. Chronic obstructive pulmonary disease exacerbation. 2. Acute bronchitis.  3. Hypoxia.  4. Hyperglycemia with steroids.  5. Tobacco abuse.  6. Cannabis abuse.  7. Chronic back pain.  DISCHARGE CONDITION: Fair.   DISCHARGE MEDICATIONS: The patient is to resume her outpatient medications which are:  1. Benzonatate 100 mg p.o. 3 times daily as needed.  2. Azithromycin 250 mg p.o. daily for four more days.  3. Ibuprofen 800 mg p.o. 3 times daily.  4. Spiriva one inhalation once daily.  5. Aspirin 81 mg p.o. daily.  6. Lexapro 10 mg p.o. daily. 7. Zantac 150 mg a day.  8. Albuterol 2 puffs every six hours as needed.  9. Oxybutynin 5 mg p.o. twice daily.  10. Lisinopril 10 mg p.o. once daily.  11. Baclofen 10 mg p.o. 3 times daily.  12. Cymbalta 10 mg p.o. at bedtime.  13. Lorazepam 0.5 mg 3 times daily as needed.  14. Symbicort 2 puffs twice daily.   ADDITIONAL MEDICATIONS:  1. Nicotine patch 14 mg topically daily and nicotine oral inhaler one cartridge inhalation as needed every one hour.  2. Prednisone 60 mg p.o. on 04/15/2011, then taper by 10 mg daily until stopped.  3. Percocet 5/325 mg, 1 tablet every four hours as needed, 20 pills given.  4. Meloxicam 7.5 mg p.o. daily.   HOME OXYGEN: None.   DIET: 2 grams salt, low fat, low calorie. The patient was advised to lose weight.  ACTIVITY LIMITATIONS: As tolerated.   FOLLOWUP:  Follow-up appointment with Dr. Julie French [place] on 04/25/2011 at 9 a.m. in her office.  CONSULTANTS:  Care management.   RADIOLOGIC STUDIES: Chest x-ray, portable single view 04/12/2011 showed no acute cardiopulmonary disease.   HISTORY OF PRESENT ILLNESS: The patient is a 59 year old Caucasian female with history  of chronic obstructive pulmonary disease and tobacco abuse who presented to the hospital with complaints of shortness of breath on 04/12/2011. Please refer to Dr. Keenan Bachelor admission note on 04/12/2011. She was short of breath, wheezing, and coughing with worsening wheezing. In the hospital she was noted to have oxygen saturations of 90-93% on room air and hospitalist services were contacted for admission. Respiration rate was 30, blood pressure 115/74, temperature 98.6, pulse 75 in the Emergency Room. Physical exam revealed diminished breath sounds as well as rhonchi and wheezing bilaterally, labored inspirations as well as increased effort to breathe especially when she spoke or moved around. No dullness to percussion. The patient was noted to be in mild respiratory distress. The patient's EKG showed normal sinus rhythm at 73 beats per minute, normal axis, no acute ST-T changes. Chest x-ray was unremarkable.   LABORATORY DATA:  On 04/12/2011, glucose 107. Her BNP otherwise was unremarkable. The patient's liver enzymes were remarkable for albumin level of 3.2. Urine drug screen was positive for cannabinoids as well as opiates. CBC showed a white blood cell count of 9.1, hemoglobin 14.8, platelet count 183.   HOSPITAL COURSE: The patient was admitted to the hospital. She was started on steroids, inhalation therapy, and oxygen. Her condition improved significantly.  She felt she was improving and she was recommended to continue the steroid taper as well as inhalation therapy and continue antibiotic therapy. She was assessed for home oxygen need and she was able to  ambulate with no oxygen and her oxygen saturation remained stable at 94% on room air. In regard to bronchitis, we tried to obtain sputum; however, results are not available at the time of dictation. The patient is to continue Zithromax and follow up with her primary care physician for further recommendations.   The patient was noted to be  hyperglycemic due to steroids. Sliding scale insulin was ordered for her while she was in the hospital on high doses of steroids. Hemoglobin A1c was ordered, however, results are not obtained until the day of discharge, unfortunately.  In regard to tobacco abuse, I discussed about abuse issues.  She wanted to try nicotine replacement therapy and possibly quit smoking. She was advised to continue nicotine inhaler as well as patch. She was also advised to stop cannabis abuse due to concerns about long-term injury/risks to health.   She is being discharged in stable condition with the above-mentioned medications and followup. Her vital signs day of discharge: Temperature 98.3, pulse 76, respiration rate 18, blood pressure 126/76, saturation 93% on room air at rest and 94% on exertion.   TIME SPENT: 40 minutes.   ____________________________ Theodoro Grist, MD rv:bjt D: 04/14/2011 17:26:29 ET T: 04/15/2011 12:32:05 ET JOB#: [id number]  cc: Theodoro Grist, MD, <Dictator> Dr. Julie French [place]  Eliot Bencivenga MD ELECTRONICALLY SIGNED 04/29/2011 70:96

## 2014-05-01 ENCOUNTER — Ambulatory Visit: Payer: Self-pay

## 2014-05-08 ENCOUNTER — Encounter
Admission: RE | Disposition: A | Discharge status: Home / Self Care | Payer: Self-pay | Source: Ambulatory Visit | Attending: Surgery

## 2014-05-08 ENCOUNTER — Ambulatory Visit: Payer: Commercial Managed Care - HMO | Admitting: Certified Registered Nurse Anesthetist

## 2014-05-08 ENCOUNTER — Ambulatory Visit: Payer: Commercial Managed Care - HMO

## 2014-05-08 ENCOUNTER — Encounter: Payer: Self-pay | Admitting: Anesthesiology

## 2014-05-08 ENCOUNTER — Ambulatory Visit
Admission: RE | Admit: 2014-05-08 | Discharge: 2014-05-08 | Disposition: A | Discharge status: Home / Self Care | Payer: Commercial Managed Care - HMO | Source: Ambulatory Visit | Attending: Surgery | Admitting: Surgery

## 2014-05-08 DIAGNOSIS — Z882 Allergy status to sulfonamides status: Secondary | ICD-10-CM | POA: Insufficient documentation

## 2014-05-08 DIAGNOSIS — F39 Unspecified mood [affective] disorder: Secondary | ICD-10-CM | POA: Diagnosis not present

## 2014-05-08 DIAGNOSIS — F17200 Nicotine dependence, unspecified, uncomplicated: Secondary | ICD-10-CM | POA: Diagnosis not present

## 2014-05-08 DIAGNOSIS — M797 Fibromyalgia: Secondary | ICD-10-CM | POA: Insufficient documentation

## 2014-05-08 DIAGNOSIS — J45909 Unspecified asthma, uncomplicated: Secondary | ICD-10-CM | POA: Insufficient documentation

## 2014-05-08 DIAGNOSIS — F419 Anxiety disorder, unspecified: Secondary | ICD-10-CM | POA: Diagnosis not present

## 2014-05-08 DIAGNOSIS — I10 Essential (primary) hypertension: Secondary | ICD-10-CM | POA: Insufficient documentation

## 2014-05-08 DIAGNOSIS — F319 Bipolar disorder, unspecified: Secondary | ICD-10-CM | POA: Diagnosis not present

## 2014-05-08 DIAGNOSIS — K644 Residual hemorrhoidal skin tags: Secondary | ICD-10-CM | POA: Insufficient documentation

## 2014-05-08 DIAGNOSIS — K6289 Other specified diseases of anus and rectum: Secondary | ICD-10-CM

## 2014-05-08 DIAGNOSIS — F209 Schizophrenia, unspecified: Secondary | ICD-10-CM | POA: Insufficient documentation

## 2014-05-08 DIAGNOSIS — Z86718 Personal history of other venous thrombosis and embolism: Secondary | ICD-10-CM | POA: Insufficient documentation

## 2014-05-08 DIAGNOSIS — Z79899 Other long term (current) drug therapy: Secondary | ICD-10-CM | POA: Insufficient documentation

## 2014-05-08 DIAGNOSIS — G43909 Migraine, unspecified, not intractable, without status migrainosus: Secondary | ICD-10-CM | POA: Diagnosis not present

## 2014-05-08 DIAGNOSIS — E785 Hyperlipidemia, unspecified: Secondary | ICD-10-CM | POA: Insufficient documentation

## 2014-05-08 DIAGNOSIS — R32 Unspecified urinary incontinence: Secondary | ICD-10-CM | POA: Diagnosis not present

## 2014-05-08 DIAGNOSIS — K219 Gastro-esophageal reflux disease without esophagitis: Secondary | ICD-10-CM | POA: Insufficient documentation

## 2014-05-08 DIAGNOSIS — E039 Hypothyroidism, unspecified: Secondary | ICD-10-CM | POA: Diagnosis not present

## 2014-05-08 DIAGNOSIS — M545 Low back pain: Secondary | ICD-10-CM | POA: Diagnosis not present

## 2014-05-08 DIAGNOSIS — J449 Chronic obstructive pulmonary disease, unspecified: Secondary | ICD-10-CM | POA: Diagnosis not present

## 2014-05-08 DIAGNOSIS — F329 Major depressive disorder, single episode, unspecified: Secondary | ICD-10-CM | POA: Insufficient documentation

## 2014-05-08 DIAGNOSIS — C44520 Squamous cell carcinoma of anal skin: Secondary | ICD-10-CM | POA: Diagnosis not present

## 2014-05-08 DIAGNOSIS — D492 Neoplasm of unspecified behavior of bone, soft tissue, and skin: Secondary | ICD-10-CM | POA: Diagnosis present

## 2014-05-08 HISTORY — PX: RECTAL BIOPSY: SHX2303

## 2014-05-08 HISTORY — PX: EVALUATION UNDER ANESTHESIA WITH HEMORRHOIDECTOMY: SHX5624

## 2014-05-08 LAB — URINE DRUG SCREEN, QUALITATIVE (ARMC ONLY)
Amphetamines, Ur Screen: POSITIVE — AB
BARBITURATES, UR SCREEN: NOT DETECTED
BENZODIAZEPINE, UR SCRN: NOT DETECTED
Cannabinoid 50 Ng, Ur ~~LOC~~: NOT DETECTED
Cocaine Metabolite,Ur ~~LOC~~: NOT DETECTED
MDMA (Ecstasy)Ur Screen: NOT DETECTED
Methadone Scn, Ur: NOT DETECTED
OPIATE, UR SCREEN: POSITIVE — AB
Phencyclidine (PCP) Ur S: NOT DETECTED
TRICYCLIC, UR SCREEN: NOT DETECTED

## 2014-05-08 SURGERY — BIOPSY, RECTUM
Anesthesia: General

## 2014-05-08 MED ORDER — GELATIN ABSORBABLE 100 CM EX MISC
CUTANEOUS | Status: AC
  Filled 2014-05-08: qty 1

## 2014-05-08 MED ORDER — FENTANYL CITRATE (PF) 100 MCG/2ML IJ SOLN
INTRAMUSCULAR | Status: AC
  Filled 2014-05-08: qty 2

## 2014-05-08 MED ORDER — LACTATED RINGERS IV SOLN
INTRAVENOUS | Status: DC
  Administered 2014-05-08 (×2): via INTRAVENOUS

## 2014-05-08 MED ORDER — FENTANYL CITRATE (PF) 100 MCG/2ML IJ SOLN
25.0000 ug | INTRAMUSCULAR | Status: AC | PRN
  Administered 2014-05-08 (×4): 25 ug via INTRAVENOUS

## 2014-05-08 MED ORDER — OXYCODONE-ACETAMINOPHEN 5-325 MG PO TABS: 1.0000 | ORAL_TABLET | ORAL | Status: DC | PRN

## 2014-05-08 MED ORDER — ONDANSETRON HCL 4 MG/2ML IJ SOLN
INTRAMUSCULAR | Status: DC | PRN
  Administered 2014-05-08: 4 mg via INTRAVENOUS

## 2014-05-08 MED ORDER — GLYCOPYRROLATE 0.2 MG/ML IJ SOLN
INTRAMUSCULAR | Status: DC | PRN
  Administered 2014-05-08: 0.6 mg via INTRAVENOUS

## 2014-05-08 MED ORDER — PROPOFOL 10 MG/ML IV BOLUS
INTRAVENOUS | Status: DC | PRN
  Administered 2014-05-08: 150 mg via INTRAVENOUS

## 2014-05-08 MED ORDER — NEOSTIGMINE METHYLSULFATE 10 MG/10ML IV SOLN
INTRAVENOUS | Status: DC | PRN
  Administered 2014-05-08: 3 mg via INTRAVENOUS

## 2014-05-08 MED ORDER — GELATIN ABSORBABLE 12-7 MM EX MISC
CUTANEOUS | Status: DC | PRN
  Administered 2014-05-08: 1

## 2014-05-08 MED ORDER — OXYCODONE-ACETAMINOPHEN 5-325 MG PO TABS
ORAL_TABLET | ORAL | Status: AC
  Administered 2014-05-08: 18:00:00
  Filled 2014-05-08: qty 1

## 2014-05-08 MED ORDER — CEFAZOLIN SODIUM-DEXTROSE 2-3 GM-% IV SOLR
INTRAVENOUS | Status: AC
  Administered 2014-05-08: 2 g via INTRAVENOUS
  Filled 2014-05-08: qty 50

## 2014-05-08 MED ORDER — ONDANSETRON HCL 4 MG/2ML IJ SOLN: 4.0000 mg | Freq: Once | INTRAMUSCULAR | Status: DC | PRN

## 2014-05-08 MED ORDER — ROCURONIUM BROMIDE 100 MG/10ML IV SOLN
INTRAVENOUS | Status: DC | PRN
  Administered 2014-05-08: 30 mg via INTRAVENOUS

## 2014-05-08 MED ORDER — MIDAZOLAM HCL 2 MG/2ML IJ SOLN
INTRAMUSCULAR | Status: DC | PRN
  Administered 2014-05-08 (×2): 1 mg via INTRAVENOUS

## 2014-05-08 MED ORDER — CEFAZOLIN SODIUM-DEXTROSE 2-3 GM-% IV SOLR
2.0000 g | INTRAVENOUS | Status: AC
  Administered 2014-05-08: 2 g via INTRAVENOUS

## 2014-05-08 MED ORDER — FENTANYL CITRATE (PF) 100 MCG/2ML IJ SOLN
INTRAMUSCULAR | Status: DC | PRN
  Administered 2014-05-08 (×2): 50 ug via INTRAVENOUS

## 2014-05-08 SURGICAL SUPPLY — 33 items
BRIEF STRETCH MATERNITY 2XLG (MISCELLANEOUS) IMPLANT
CANISTER SUCT 1200ML W/VALVE (MISCELLANEOUS) ×3 IMPLANT
DRAPE LAPAROTOMY 100X77 ABD (DRAPES) ×3 IMPLANT
DRAPE LEGGINS SURG 28X43 STRL (DRAPES) IMPLANT
DRAPE TABLE BACK 80X90 (DRAPES) IMPLANT
GAUZE SPONGE 4X4 12PLY STRL (GAUZE/BANDAGES/DRESSINGS) ×3 IMPLANT
GLOVE BIO SURGEON STRL SZ7.5 (GLOVE) ×6 IMPLANT
GLOVE INDICATOR 8.0 STRL GRN (GLOVE) ×3 IMPLANT
GOWN STRL REUS W/ TWL LRG LVL3 (GOWN DISPOSABLE) ×2 IMPLANT
GOWN STRL REUS W/TWL LRG LVL3 (GOWN DISPOSABLE) ×4
HANDLE YANKAUER SUCT BULB TIP (MISCELLANEOUS) IMPLANT
HARMONIC SCALPEL FOCUS (MISCELLANEOUS) IMPLANT
JELLY LUB 2OZ STRL (MISCELLANEOUS) ×2
JELLY LUBE 2OZ STRL (MISCELLANEOUS) ×1 IMPLANT
KIT RM TURNOVER CYSTO AR (KITS) ×3 IMPLANT
KIT RM TURNOVER STRD PROC AR (KITS) ×3 IMPLANT
LABEL OR SOLS (LABEL) ×3 IMPLANT
NDL SAFETY 25GX1.5 (NEEDLE) ×3 IMPLANT
NS IRRIG 500ML POUR BTL (IV SOLUTION) ×3 IMPLANT
PACK BASIN MINOR ARMC (MISCELLANEOUS) ×3 IMPLANT
PAD ABD DERMACEA PRESS 5X9 (GAUZE/BANDAGES/DRESSINGS) IMPLANT
PAD GROUND ADULT SPLIT (MISCELLANEOUS) ×3 IMPLANT
PAD OB MATERNITY 4.3X12.25 (PERSONAL CARE ITEMS) IMPLANT
PAD TELFA 2X3 NADH STRL (GAUZE/BANDAGES/DRESSINGS) IMPLANT
SOL PREP PVP 2OZ (MISCELLANEOUS) ×3
SOLUTION PREP PVP 2OZ (MISCELLANEOUS) ×1 IMPLANT
SPONGE XRAY 4X4 16PLY STRL (MISCELLANEOUS) IMPLANT
SUT CHROMIC 3 0 SH 27 (SUTURE) ×3 IMPLANT
SWABSTK COMLB BENZOIN TINCTURE (MISCELLANEOUS) ×3 IMPLANT
SYR BULB EAR ULCER 3OZ GRN STR (SYRINGE) ×3 IMPLANT
SYRINGE 10CC LL (SYRINGE) ×3 IMPLANT
TUBING CONNECTING 10 (TUBING) IMPLANT
TUBING CONNECTING 10' (TUBING)

## 2014-05-08 NOTE — Anesthesia Preprocedure Evaluation (Addendum)
Anesthesia Evaluation  Patient identified by MRN, date of birth, ID band Patient awake    Reviewed: Allergy & Precautions, NPO status , Patient's Chart, lab work & pertinent test results  History of Anesthesia Complications Negative for: history of anesthetic complications  Airway Mallampati: II  TM Distance: >3 FB Neck ROM: full    Dental  (+) Edentulous Upper   Pulmonary shortness of breath and with exertion, pneumonia -, resolved, COPD COPD inhaler, Current Smoker (1/2 ppd),          Cardiovascular hypertension (Not on meds now),     Neuro/Psych Anxiety Depression Bipolar Disorder    GI/Hepatic GERD-  Medicated and Controlled,  Endo/Other    Renal/GU      Musculoskeletal   Abdominal   Peds  Hematology   Anesthesia Other Findings   Reproductive/Obstetrics                           Anesthesia Physical Anesthesia Plan  ASA: III  Anesthesia Plan: General LMA and General ETT   Post-op Pain Management:    Induction:   Airway Management Planned:   Additional Equipment:   Intra-op Plan:   Post-operative Plan:   Informed Consent: I have reviewed the patients History and Physical, chart, labs and discussed the procedure including the risks, benefits and alternatives for the proposed anesthesia with the patient or authorized representative who has indicated his/her understanding and acceptance.     Plan Discussed with: CRNA  Anesthesia Plan Comments:        Anesthesia Quick Evaluation

## 2014-05-08 NOTE — Transfer of Care (Addendum)
Immediate Anesthesia Transfer of Care Note  Patient: [name]  Procedure(s) Performed: Procedure(s): BIOPSY RECTAL (N/A) EXAM UNDER ANESTHESIA WITH HEMORRHOIDECTOMY (N/A) HEMORRHOIDECTOMY (N/A)  Patient Location: PACU  Anesthesia Type:General  Level of Consciousness: awake  Airway & Oxygen Therapy: Patient Spontanous Breathing and Patient connected to nasal cannula oxygen  Post-op Assessment: Report given to RN, Post -op Vital signs reviewed and stable and Patient moving all extremities  Post vital signs: stable  Last Vitals:  Filed Vitals:   05/08/14 1547  BP: 142/84  Pulse:   Temp: 37.2 C  Resp:     Complications: No apparent anesthesia complications

## 2014-05-08 NOTE — Anesthesia Postprocedure Evaluation (Signed)
  Anesthesia Post-op Note  Patient: [name]  Procedure(s) Performed: Procedure(s): BIOPSY RECTAL (N/A) EXAM UNDER ANESTHESIA WITH HEMORRHOIDECTOMY (N/A)  Anesthesia type:General ETT, General  Patient location: PACU  Post pain: Pain level controlled  Post assessment: Post-op Vital signs reviewed, Patient's Cardiovascular Status Stable, Respiratory Function Stable, Patent Airway and No signs of Nausea or vomiting  Post vital signs: Reviewed and stable  Last Vitals:  Filed Vitals:   05/08/14 1553  BP:   Pulse:   Temp: 37.2 C  Resp:     Level of consciousness: awake, alert  and patient cooperative  Complications: No apparent anesthesia complications

## 2014-05-08 NOTE — Discharge Instructions (Signed)
Please call or return to ER if you have increased pain, nausea/vomiting, bleeding from incision.  Follow up in 1 week to discuss pathology.

## 2014-05-08 NOTE — Addendum Note (Signed)
Addendum  created 05/08/14 1602 by Iver Nestle, MD   Modules edited: Notes Section   Notes Section:  File: [id number]

## 2014-05-08 NOTE — H&P (Signed)
I have seen and evalulated Ms. [name].  No changes to H and P.

## 2014-05-08 NOTE — Brief Op Note (Signed)
05/08/2014  3:35 PM  PATIENT:  [name]  59 y.o. 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  PROCEDURE: Rectal exam under anesthesia Biopsy of large perianal/rectal mass  SURGEON:  Surgeon(s) and Role: Panel 1:    * Marlyce Huge, MD - Primary    * Nestor Lewandowsky, MD - Assisting  Panel 2:    * Marlyce Huge, MD - Primary    ANESTHESIA:   general  EBL:   10 ml  BLOOD ADMINISTERED:none  DRAINS: none   LOCAL MEDICATIONS USED:  NONE  SPECIMEN:  Biopsy / Limited Resection  DISPOSITION OF SPECIMEN:  PATHOLOGY  COUNTS:  YES  TOURNIQUET:  * No tourniquets in log *  DICTATION: .Note written in paper chart  PLAN OF CARE: Discharge to home after PACU  PATIENT DISPOSITION:  PACU - hemodynamically stable.   Delay start of Pharmacological VTE agent (>24hrs) due to surgical blood loss or risk of bleeding: not applicable

## 2014-05-08 NOTE — Anesthesia Procedure Notes (Signed)
Procedure Name: Intubation Date/Time: 05/08/2014 2:50 PM Performed by: Rolla Plate Pre-anesthesia Checklist: Patient identified Patient Re-evaluated:Patient Re-evaluated prior to inductionOxygen Delivery Method: Circle system utilized Preoxygenation: Pre-oxygenation with 100% oxygen Intubation Type: IV induction Ventilation: Mask ventilation without difficulty Laryngoscope Size: Miller and 2 Grade View: Grade II Tube type: Oral Tube size: 7.0 mm Number of attempts: 1 Airway Equipment and Method: Stylet Placement Confirmation: ETT inserted through vocal cords under direct vision,  positive ETCO2 and breath sounds checked- equal and bilateral Secured at: 20 cm Tube secured with: Tape Dental Injury: Teeth and Oropharynx as per pre-operative assessment

## 2014-05-08 NOTE — Op Note (Signed)
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 Zadrozny is a pleasant 59 yo F who presents with perianal pain and incontinence.  I felt a large mass on office examination. Details of procedure: Ms. Kolton was consented for procedure.  She was brought to the operating room suite. She was induced, ETT was placed and general anesthesia was administered.  She was then laid prone on the OR table.  Her anus was prepped and draped in the standard surgical fashion.  A time out was performed denoting the patient name, operative site and procedure to be performed.  I then placed a finger in Ms. Robinsons anus and noted a large mass encompassing 50% of her right anus extending from the opening to approx 10 cm internal.  It was firm and fixed with significant necrosis.  Various excisional specimens were taken.  Hemostasis was then obtained.  A piece of gelfoam was then placed in the anus for hemostasis.  The anus was then dressed with gauze and mesh underwear.  There were no immediate complications.  Patient was then awakened and brought to post anesthesia care unit.  Needle, sponge and instrument count was correct at the end of procedure.

## 2014-05-11 LAB — SURGICAL PATHOLOGY

## 2014-05-15 ENCOUNTER — Encounter: Payer: Self-pay | Admitting: Surgery

## 2014-05-16 NOTE — H&P (Signed)
PATIENT NAME:  [name], [name] [id number] OF BIRTH:  Nov 15, 1955 OF ADMISSION:  12/02/2012 PHYSICIAN: Emergency Room MD  PHYSICIAN: Orson Slick, MD  DATA: [name] is a 59 year old female with a history of depression. COMPLAINT: "My daughter does not talk to me."   OF PRESENT ILLNESS: [name] has a long history of depression. She has been a patient of Dr. Kasandra Knudsen stable on medications. The patient came to the ER with her son in law after she overdosed on multiple medications in the context of family conflict. The patient, who used to live independently, moved in with her daughter, six children and a boyfriend, to help them financially. Her generosity has not been appreciated. She is not involved in any family events. She became "invisible". In addition, the daughter is in conflict with her sister which saddens the mother. The patient, in despair, overdosed on multiple medications. She was prevented from finishing the job by her 52-year-old granddaughter who begged her not to die. She reluctantly complied and asked her son in law to be to take her to the hospital. She complains of poor sleep, decreased appetite, anhedonia, feeling of guilt, hopelessness, worthlessness, poor memory and concentration, low energy, crying spells, and social isolation culminating in a suicide attempt. She denies psychotic symptoms or symptoms suggestive of bipolar mania. There is no substance abuse problem.  PSYCHIATRIC HISTORY: The patient has been hospitalized several times at [facility]. There is remote history of cocaine use. She had several suicide attempts by overdose.  PSYCHIATRIC HISTORY: Son with bipolar, granddaughter with ADHD.    MEDICAL HISTORY:  1. COPD. 2. GERD. 3. Chronic pain.     ON ADMISSION:  Medication Instructions  tramadol 50 mg oral tablet  1 tab(s) orally 3 to 4 times a day, As Needed for pain.   meloxicam 7.5 mg oral tablet  1 tab(s) orally once a day for arthritis.   lyrica 75 mg oral capsule   1 cap(s) orally 2 times a day for neuropathic pain.   duloxetine 60 mg oral delayed release capsule  1 cap(s) orally once a day for depression.   clonazepam 1 mg oral tablet  1 tab(s) orally 3 times a day for anxiety.   temazepam 15 mg oral capsule  1 cap(s) orally once a day (at bedtime) for sleep.   albuterol  1 puff(s) orally every 4 hours as needed for shortness of breath.    advair diskus 500 mcg-50 mcg inhalation powder  1 puff(s) inhaled once a day for COPD.   symbicort 80 mcg-4.5 mcg/inh inhalation aerosol  1 puff(s) inhaled 2 times a day for COPD.   spiriva 18 mcg inhalation capsule  1  inhaled once a day for COPD.   zantac 150 oral tablet  1 tab(s) orally once a day for acid reflux.   oxybutynin 5 mg oral tablet  1 tab(s) orally 2 times a day for urinary frequency.   Sulfa drugs.   HISTORY: The patient is disabled from pain and mental illness. She has Medicaid. She lives with her family which is a source of stress. She wants to relocate to [facility] in December.  OF SYSTEMS: No fevers or chills. No weight changes. No double or blurred vision. No hearing loss. No shortness of breath or cough. No chest pain or orthopnea. 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  No abdominal pain, nausea, vomiting or diarrhea. No incontinence or frequency. No heat or cold intolerance. No anemia or easy bruising. No acne or rash.  No muscle or joint pain. No tingling or weakness. See history of present illness for details.  EXAMINATION:SIGNS: Blood pressure 144/66, pulse 92, respirations 20, temperature 98.6. This is a well developed female in no acute distress. The pupils are equal, round and reactive to light. Sclerae anicteric. Supple. No thyromegaly. Clear to auscultation. No dullness to percussion. Regular rhythm and rate. No murmurs, rubs or gallops. Soft, nontender, nondistended. Positive bowel sounds. Normal muscle strength in all extremities. No rashes or bruises. No cervical adenopathy.  Cranial nerves II through XII are intact.  DATA: Chemistries are within normal limits. Blood alcohol level zero. LFTs within normal limits. UDS negative for substances. CBC within normal limits except WBC 12.3. Urinalysis is not suggestive of urinary tract infection.  STATUS EXAMINATION ON ADMISSION: The patient is alert and oriented to person, place, time and situation. She is pleasant, polite and cooperative. She is well groomed and casually dresed. She maintains good eye contact. She recognizes me from previous admission. She is tearful at times. Her speech is soft. Mood is "sad" with flat affect. Thought process is logical and goal oriented. Thought content: She denies suicidal or homicidal ideation but was admitted after an overdose. She is not delusional, paranoid or hallucinating. Her cognition is grossly intact. Her insight and judgment are questionable.  RISK ASSESSMENT ON ADMISSION: This is a patient with a long history of depression, mood instability and suicide attempts who overdosed on medications in the context of family problems. She is at increased risk of suicide.  DIAGNOSES:I:  Major depressive disorder recurrent severe without psychotic features.   AXIS II:  Deferred. III:  COPD, GERD, Chronic pain.   AXIS IV:  Mental illness, family conflict.  V:  Global Assessment of Functioning score on admission 25.   The patient was admitted to [facility] for safety, stabilization and medication management. She was initially placed on suicide precautions and was closely monitored for any unsafe behaviors. She underwent full psychiatric and risk assessment. She received pharmacotherapy, individual and group psychotherapy, substance abuse counseling and support from therapeutic milieu.   1.  Suicidal ideation: The patient is able to contract for safety.    Mood: We increased the dose of Cymbalta to 60 mg twice daily. We continued clonazepam.  Medical: We  continued all medications as prescribed by her PCP.   Chronic pain: The patient does not complain of pain.      Disposition: The patient will be discharged to home.      Electronic Signatures: Orson Slick (MD)  (Signed on 27-Nov-14 21:49)  Authored  Last Updated: 27-Nov-14 21:49 by Orson Slick (MD)

## 2014-05-16 NOTE — H&P (Signed)
PATIENT NAME:  [name], [name] [id number] OF BIRTH:  11-02-1955 OF ADMISSION:  09/03/2012 PHYSICIAN: Emergency Room MD  PHYSICIAN: Orson Slick, MD  DATA: Ms. Rogoff is a 59 year old female with a history of depression. COMPLAINT: "My family does not appreciate me."   OF PRESENT ILLNESS: Ms. Beidleman has a long history of depression and chronic pain. She is a patient of Dr. Kasandra Knudsen. She has been stable on her medications but lately felt that Lexapro has stopped working. This is in the context of severe family conflict. The patient, who used to live independently, moved in with her daughter who has six children and a boyfriend, to help them financially. Her generosity has not been appreciated. She is not involved in any family events. She became "invisible". In addition, her 67 year old granddaughter has been disrespectful and her mother is unwilling to address the problem. The patient, in despair, overdosed on multiple sedating medications. She wanted to go to sleep. She still wishes she did not wake up. She complains of poor sleep, decreased appetite, anhedonia, feeling of guilt, hopelessness, worthlessness, poor memory and concentration, low energy, crying spells, and social isolation culminating in a suicide attempt. She denies psychotic symptoms or symptoms suggestive of bipolar mania. There is no substance abuse problem.  PSYCHIATRIC HISTORY: The patient has been hospitalized several times at [facility]. There is remote history of cocaine use. She had several suicide attempts by overdose.  PSYCHIATRIC HISTORY: Son with bipolar, granddaughter with ADHD.    MEDICAL HISTORY:  1. COPD. 2. GERD. 3. Chronic pain.   4. UTI.   ON ADMISSION:  1. Zofran ODT 4 mg as needed.  2. Percocet 5/325 md every 6 hours as needed.    3. Cipro 500 mg twice daily. 4. IBU 800 mg 3 times a day 5. Spiriva 18 mcg daily.  6. Lexapro 10 mg daily.  7. Zantac '150mg'$  twice daily.  8. oxybutynin 5 mg twice daily.  9. baclofen  10 mg three times daily as needed.  albuterol every 4 hours as needed.  clonazepam 1 mgthree times daily.  12. Cymbalta 20 mg daily.  Symbicort twice daily.  Lyrica 75 mg twice daily.   Sulfa drugs.   HISTORY: The patient is disabled from pain and mental illness. She has Medicaid. She lives with her family which is a source of stress. She wants to relocate to [facility] in December.   REVIEW OF SYSTEMS: No fevers or chills. No weight changes. No double or blurred vision. No hearing loss. No shortness of breath or cough. No chest pain or orthopnea. 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  No abdominal pain, nausea, vomiting or diarrhea. No incontinence or frequency. No heat or cold intolerance. No anemia or easy bruising. No acne or rash. No muscle or joint pain. No tingling or weakness. See history of present illness for details.  EXAMINATION:SIGNS: Blood pressure 144/66, pulse 92, respirations 20, temperature 98.6. This is a well developed female in no acute distress. The pupils are equal, round and reactive to light. Sclerae anicteric. Supple. No thyromegaly. Clear to auscultation. No dullness to percussion. Regular rhythm and rate. No murmurs, rubs or gallops. Soft, nontender, nondistended. Positive bowel sounds. Normal muscle strength in all extremities. No rashes or bruises. No cervical adenopathy. Cranial nerves II through XII are intact.  DATA: Chemistries are within normal limits. Blood alcohol level zero. LFTs within normal limits. TSH 1.65. UDS negative for substances. CBC within normal limits except WBC 11.3. Urinalysis is suggestive of urinary  tract infection.  STATUS EXAMINATION ON ADMISSION: The patient is alert and oriented to person, place, time and situation. She is pleasant, polite and cooperative. She is well groomed. She maintains poor eye contact. She is tearful at times. Her speech is soft. Mood is "sad" with flat affect. Thought process is logical and goal oriented.  Thought content: She denies suicidal or homicidal ideation but was admitted after an overdose. She is not delusional, paranoid or hallucinating. Her cognition is grossly intact. Her insight and judgment are questionable.  RISK ASSESSMENT ON ADMISSION: This is a patient with a long history of depression, mood instability and suicide attempts who overdosed on medications in the context of family problems. She is at increased risk of suicide.  ITIAL DIAGNOSES:I:  Major depressive disorder recurrent severe without psychotic features.   AXIS II:  Deferred. III:  COPD, GERD, Chronic pain.   AXIS IV:  Mental illness, family conflict.  V:  Global Assessment of Functioning score on admission 25.  The patient was admitted to [facility] for safety, stabilization and medication management. She was initially placed on suicide precautions and was closely monitored for any unsafe behaviors. She underwent full psychiatric and risk assessment. She received pharmacotherapy, individual and group psychotherapy, substance abuse counseling and support from therapeutic milieu.   1.  Suicidal ideation: The patient still passively suicidal but able to contract for safety.    Mood: We increased Cymbalta and likely discontinue Lexapro.   Will keep clonazepam.  Medical: We continue all medications as prescribed by her PCP.   Chronic pain: She is on Lyrica and Percocet and Tramadol. Will offer Tramadol but prescriptions will be written.     Disposition: Discharge to home.    Electronic Signatures: Orson Slick (MD)  (Signed on 30-Aug-14 10:32)  Authored  Last Updated: 30-Aug-14 10:32 by Orson Slick (MD)

## 2014-05-17 ENCOUNTER — Other Ambulatory Visit: Payer: Self-pay | Admitting: Surgery

## 2014-05-17 ENCOUNTER — Inpatient Hospital Stay
Admission: EM | Admit: 2014-05-17 | Discharge: 2014-05-30 | DRG: 331 | Disposition: A | Discharge status: Home / Self Care | Payer: Commercial Managed Care - HMO | Attending: Surgery | Admitting: Surgery

## 2014-05-17 ENCOUNTER — Encounter: Payer: Self-pay | Admitting: Surgery

## 2014-05-17 DIAGNOSIS — Z452 Encounter for adjustment and management of vascular access device: Secondary | ICD-10-CM

## 2014-05-17 DIAGNOSIS — Z808 Family history of malignant neoplasm of other organs or systems: Secondary | ICD-10-CM | POA: Diagnosis not present

## 2014-05-17 DIAGNOSIS — R159 Full incontinence of feces: Secondary | ICD-10-CM | POA: Diagnosis present

## 2014-05-17 DIAGNOSIS — J45909 Unspecified asthma, uncomplicated: Secondary | ICD-10-CM | POA: Diagnosis present

## 2014-05-17 DIAGNOSIS — C210 Malignant neoplasm of anus, unspecified: Secondary | ICD-10-CM | POA: Diagnosis not present

## 2014-05-17 DIAGNOSIS — F319 Bipolar disorder, unspecified: Secondary | ICD-10-CM | POA: Diagnosis present

## 2014-05-17 DIAGNOSIS — Z86718 Personal history of other venous thrombosis and embolism: Secondary | ICD-10-CM

## 2014-05-17 DIAGNOSIS — Z7951 Long term (current) use of inhaled steroids: Secondary | ICD-10-CM | POA: Diagnosis not present

## 2014-05-17 DIAGNOSIS — F17210 Nicotine dependence, cigarettes, uncomplicated: Secondary | ICD-10-CM | POA: Diagnosis present

## 2014-05-17 DIAGNOSIS — Z79899 Other long term (current) drug therapy: Secondary | ICD-10-CM

## 2014-05-17 DIAGNOSIS — F209 Schizophrenia, unspecified: Secondary | ICD-10-CM | POA: Diagnosis present

## 2014-05-17 DIAGNOSIS — C218 Malignant neoplasm of overlapping sites of rectum, anus and anal canal: Secondary | ICD-10-CM | POA: Diagnosis present

## 2014-05-17 DIAGNOSIS — F4310 Post-traumatic stress disorder, unspecified: Secondary | ICD-10-CM | POA: Diagnosis present

## 2014-05-17 DIAGNOSIS — Z833 Family history of diabetes mellitus: Secondary | ICD-10-CM

## 2014-05-17 DIAGNOSIS — M797 Fibromyalgia: Secondary | ICD-10-CM | POA: Diagnosis present

## 2014-05-17 DIAGNOSIS — E785 Hyperlipidemia, unspecified: Secondary | ICD-10-CM | POA: Diagnosis present

## 2014-05-17 DIAGNOSIS — F419 Anxiety disorder, unspecified: Secondary | ICD-10-CM | POA: Diagnosis present

## 2014-05-17 DIAGNOSIS — K219 Gastro-esophageal reflux disease without esophagitis: Secondary | ICD-10-CM | POA: Diagnosis present

## 2014-05-17 DIAGNOSIS — K626 Ulcer of anus and rectum: Secondary | ICD-10-CM | POA: Diagnosis present

## 2014-05-17 DIAGNOSIS — J449 Chronic obstructive pulmonary disease, unspecified: Secondary | ICD-10-CM | POA: Diagnosis present

## 2014-05-17 DIAGNOSIS — G893 Neoplasm related pain (acute) (chronic): Secondary | ICD-10-CM | POA: Diagnosis not present

## 2014-05-17 DIAGNOSIS — I10 Essential (primary) hypertension: Secondary | ICD-10-CM | POA: Diagnosis present

## 2014-05-17 DIAGNOSIS — E876 Hypokalemia: Secondary | ICD-10-CM | POA: Diagnosis present

## 2014-05-17 DIAGNOSIS — K625 Hemorrhage of anus and rectum: Secondary | ICD-10-CM | POA: Diagnosis not present

## 2014-05-17 HISTORY — DX: Malignant neoplasm of anus, unspecified: C21.0

## 2014-05-17 LAB — CBC
HEMATOCRIT: 41.8 % (ref 35.0–47.0)
Hemoglobin: 13.8 g/dL (ref 12.0–16.0)
MCH: 29.8 pg (ref 26.0–34.0)
MCHC: 33 g/dL (ref 32.0–36.0)
MCV: 90.1 fL (ref 80.0–100.0)
Platelets: 280 10*3/uL (ref 150–440)
RBC: 4.64 MIL/uL (ref 3.80–5.20)
RDW: 14.9 % — ABNORMAL HIGH (ref 11.5–14.5)
WBC: 14.7 10*3/uL — ABNORMAL HIGH (ref 3.6–11.0)

## 2014-05-17 LAB — BASIC METABOLIC PANEL
Anion gap: 8 (ref 5–15)
BUN: 8 mg/dL (ref 6–20)
CALCIUM: 8.3 mg/dL — AB (ref 8.9–10.3)
CO2: 27 mmol/L (ref 22–32)
Chloride: 104 mmol/L (ref 101–111)
Creatinine, Ser: 0.71 mg/dL (ref 0.44–1.00)
GFR calc Af Amer: >60 mL/min (ref >60)
Glucose, Bld: 98 mg/dL (ref 65–99)
POTASSIUM: 3.1 mmol/L — AB (ref 3.5–5.1)
SODIUM: 139 mmol/L (ref 135–145)

## 2014-05-17 MED ORDER — DULOXETINE HCL 60 MG PO CPEP
60.0000 mg | ORAL_CAPSULE | Freq: Every evening | ORAL | Status: DC
  Administered 2014-05-17 – 2014-05-29 (×10): 60 mg via ORAL
  Filled 2014-05-17 (×12): qty 1

## 2014-05-17 MED ORDER — ONDANSETRON HCL 4 MG/2ML IJ SOLN
4.0000 mg | Freq: Once | INTRAMUSCULAR | Status: AC
  Administered 2014-05-17: 4 mg via INTRAVENOUS

## 2014-05-17 MED ORDER — ALBUTEROL SULFATE (2.5 MG/3ML) 0.083% IN NEBU: 2.5000 mg | INHALATION_SOLUTION | Freq: Four times a day (QID) | RESPIRATORY_TRACT | Status: DC | PRN

## 2014-05-17 MED ORDER — ZOLPIDEM TARTRATE 5 MG PO TABS
5.0000 mg | ORAL_TABLET | Freq: Every evening | ORAL | Status: DC | PRN
  Administered 2014-05-19 – 2014-05-27 (×5): 5 mg via ORAL
  Filled 2014-05-17 (×5): qty 1

## 2014-05-17 MED ORDER — CLONAZEPAM 1 MG PO TABS
1.0000 mg | ORAL_TABLET | Freq: Three times a day (TID) | ORAL | Status: DC | PRN
  Administered 2014-05-27: 1 mg via ORAL
  Filled 2014-05-17: qty 1

## 2014-05-17 MED ORDER — BUDESONIDE-FORMOTEROL FUMARATE 160-4.5 MCG/ACT IN AERO
2.0000 | INHALATION_SPRAY | Freq: Two times a day (BID) | RESPIRATORY_TRACT | Status: DC
  Administered 2014-05-17 – 2014-05-30 (×22): 2 via RESPIRATORY_TRACT
  Filled 2014-05-17: qty 6

## 2014-05-17 MED ORDER — HEPARIN SODIUM (PORCINE) 5000 UNIT/ML IJ SOLN
5000.0000 [IU] | Freq: Three times a day (TID) | INTRAMUSCULAR | Status: DC
  Administered 2014-05-17 – 2014-05-30 (×36): 5000 [IU] via SUBCUTANEOUS
  Filled 2014-05-17 (×35): qty 1

## 2014-05-17 MED ORDER — FAMOTIDINE 20 MG PO TABS
10.0000 mg | ORAL_TABLET | Freq: Every day | ORAL | Status: DC
  Administered 2014-05-17 – 2014-05-30 (×12): 10 mg via ORAL
  Filled 2014-05-17 (×2): qty 1
  Filled 2014-05-17: qty 2
  Filled 2014-05-17 (×7): qty 1
  Filled 2014-05-17: qty 2
  Filled 2014-05-17 (×2): qty 1

## 2014-05-17 MED ORDER — ETODOLAC 500 MG PO TABS
500.0000 mg | ORAL_TABLET | Freq: Two times a day (BID) | ORAL | Status: DC
  Administered 2014-05-21 – 2014-05-30 (×15): 500 mg via ORAL
  Filled 2014-05-17 (×29): qty 1

## 2014-05-17 MED ORDER — ACETAMINOPHEN 325 MG PO TABS: 650.0000 mg | ORAL_TABLET | Freq: Four times a day (QID) | ORAL | Status: DC | PRN

## 2014-05-17 MED ORDER — TIOTROPIUM BROMIDE MONOHYDRATE 18 MCG IN CAPS
18.0000 ug | ORAL_CAPSULE | RESPIRATORY_TRACT | Status: DC
  Administered 2014-05-18 – 2014-05-30 (×12): 18 ug via RESPIRATORY_TRACT
  Filled 2014-05-17 (×3): qty 5

## 2014-05-17 MED ORDER — MORPHINE SULFATE 4 MG/ML IJ SOLN
INTRAMUSCULAR | Status: AC
  Administered 2014-05-17: 4 mg via INTRAVENOUS
  Filled 2014-05-17: qty 1

## 2014-05-17 MED ORDER — ESCITALOPRAM OXALATE 10 MG PO TABS
20.0000 mg | ORAL_TABLET | Freq: Every day | ORAL | Status: DC
  Administered 2014-05-17 – 2014-05-29 (×13): 20 mg via ORAL
  Filled 2014-05-17 (×14): qty 2

## 2014-05-17 MED ORDER — SIMVASTATIN 40 MG PO TABS
40.0000 mg | ORAL_TABLET | Freq: Every day | ORAL | Status: DC
  Administered 2014-05-17 – 2014-05-29 (×13): 40 mg via ORAL
  Filled 2014-05-17 (×13): qty 1

## 2014-05-17 MED ORDER — OXYCODONE-ACETAMINOPHEN 5-325 MG PO TABS
1.0000 | ORAL_TABLET | ORAL | Status: DC | PRN
  Administered 2014-05-18 – 2014-05-29 (×37): 2 via ORAL
  Administered 2014-05-29: 1 via ORAL
  Administered 2014-05-30: 2 via ORAL
  Filled 2014-05-17 (×9): qty 2
  Filled 2014-05-17: qty 1
  Filled 2014-05-17 (×31): qty 2

## 2014-05-17 MED ORDER — ONDANSETRON HCL 4 MG/2ML IJ SOLN
INTRAMUSCULAR | Status: AC
  Administered 2014-05-17: 4 mg via INTRAVENOUS
  Filled 2014-05-17: qty 2

## 2014-05-17 MED ORDER — HYDROCODONE-ACETAMINOPHEN 5-325 MG PO TABS
1.0000 | ORAL_TABLET | ORAL | Status: DC | PRN
  Administered 2014-05-18 – 2014-05-30 (×7): 2 via ORAL
  Filled 2014-05-17 (×10): qty 2

## 2014-05-17 MED ORDER — ACETAMINOPHEN 650 MG RE SUPP: 650.0000 mg | Freq: Four times a day (QID) | RECTAL | Status: DC | PRN

## 2014-05-17 MED ORDER — MORPHINE SULFATE 4 MG/ML IJ SOLN
4.0000 mg | Freq: Once | INTRAMUSCULAR | Status: AC
  Administered 2014-05-17: 4 mg via INTRAVENOUS

## 2014-05-17 MED ORDER — ONDANSETRON HCL 4 MG/2ML IJ SOLN: 4.0000 mg | Freq: Once | INTRAMUSCULAR | Status: AC | PRN

## 2014-05-17 MED ORDER — FENTANYL CITRATE (PF) 100 MCG/2ML IJ SOLN: 25.0000 ug | INTRAMUSCULAR | Status: DC | PRN

## 2014-05-17 MED ORDER — MORPHINE SULFATE 2 MG/ML IJ SOLN
1.0000 mg | INTRAMUSCULAR | Status: DC | PRN
  Administered 2014-05-18 – 2014-05-27 (×9): 1 mg via INTRAVENOUS
  Filled 2014-05-17 (×12): qty 1

## 2014-05-17 NOTE — H&P (Addendum)
[name] at [place] NAME: [name]    MR#:  [id number]  DATE OF BIRTH:  [birth year]  DATE OF ADMISSION:  05/17/2014  PRIMARY CARE PHYSICIAN: Lavera Guise, MD   REQUESTING/REFERRING PHYSICIAN: DR Reita Cliche  CHIEF COMPLAINT:  Severe pain over the rectal non healing ulcer with chronic diarrhea  HISTORY OF PRESENT ILLNESS:  [name]  is a 59 y.o. female with a known history of schizophrenia and depression, bipolar disorder, COPD with ongoing tobacco abuse, recently underwent a rectal biopsy for nonhealing rectal ulcer which was done by Dr. Rexene Edison on 05/042014.  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 Patient comes to the emergency room since she is being constantly leaking watery fluid along with chronic diarrhea and has been having significant amount of pain around the rectal area.  ER physician Dr. Reita Cliche discussed with Dr. Burt Knack regarding surgical admission how ever Dr. Burt Knack recommended internal medicine admit patient and have a surgical consultation along with oncology consultation.  Per patient she is scheduled to have down the road a colostomy.  PAST MEDICAL HISTORY:   Past Medical History  Diagnosis Date  . Schizophrenia   . Asthma   . GERD (gastroesophageal reflux disease)   . Anxiety   . Depression   . Bipolar disorder   . COPD (chronic obstructive pulmonary disease)   . Occasional tremors     right hand  . PTSD (post-traumatic stress disorder)   . Shortness of breath dyspnea   . Fibromyalgia   . DVT (deep venous thrombosis) 2011    RUE  . Thyroid nodule   . DDD (degenerative disc disease), lumbar   . Spinal stenosis   . Peripheral neuropathy   . Rotator cuff tear     right  . Pneumonia 2011  . Hypothyroidism     no meds currently  . Anemia     during pregnancy only  . Hypertension     Off meds x 15 years-well controlled now per pt     PAST SURGICAL HISTOIRY:   Past Surgical History  Procedure Laterality Date  . Foot surgery Right   . Tubal ligation    . Eye surgery Bilateral   . Mouth surgery  2002  . Rectal biopsy N/A 05/08/2014    Procedure: BIOPSY RECTAL;  Surgeon: Marlyce Huge, MD;  Location: [facility] ORS;  Service: General;  Laterality: N/A;  . Evaluation under anesthesia with hemorrhoidectomy N/A 05/08/2014    Procedure: EXAM UNDER ANESTHESIA WITH HEMORRHOIDECTOMY;  Surgeon: Marlyce Huge, MD;  Location: [facility] ORS;  Service: General;  Laterality: N/A;    SOCIAL HISTORY:   History  Substance Use Topics  . Smoking status: Current Every Day Smoker -- 0.25 packs/day for 44 years    Types: Cigarettes  . Smokeless tobacco: Not on file  . Alcohol Use: Yes     Comment: 1 drink every 2-3 times/year    FAMILY HISTORY:  No family history on file.  DRUG ALLERGIES:   Allergies  Allergen Reactions  . Sulfa Antibiotics Rash    REVIEW OF SYSTEMS:  Review of Systems  Constitutional: Negative for fever, chills and diaphoresis.  HENT: Negative for congestion, ear pain, hearing loss, nosebleeds and sore throat.   Eyes: Negative for blurred vision, double vision, photophobia and pain.  Respiratory: Negative for hemoptysis, sputum production, wheezing and stridor.   Cardiovascular: Negative for orthopnea, claudication and leg swelling.  Gastrointestinal: Positive for diarrhea  and blood in stool. Negative for heartburn and abdominal pain.  Genitourinary: Negative for dysuria and frequency.  Musculoskeletal: Negative for back pain, joint pain and neck pain.  Skin: Negative for rash.  Neurological: Positive for weakness. Negative for tingling, sensory change, speech change, focal weakness, seizures and headaches.  Endo/Heme/Allergies: Does not bruise/bleed easily.  Psychiatric/Behavioral: Negative for suicidal ideas, memory loss and substance abuse. The patient is not nervous/anxious.   All other  systems reviewed and are negative.    MEDICATIONS AT HOME:   Prior to Admission medications   Medication Sig Start Date End Date Taking? Authorizing Provider  albuterol (PROVENTIL HFA;VENTOLIN HFA) 108 (90 BASE) MCG/ACT inhaler Inhale 2 puffs into the lungs every 6 (six) hours as needed for wheezing or shortness of breath.   Yes Historical Provider, MD  budesonide-formoterol (SYMBICORT) 160-4.5 MCG/ACT inhaler Inhale 2 puffs into the lungs 2 (two) times daily.   Yes Historical Provider, MD  clonazePAM (KLONOPIN) 1 MG tablet Take 1 mg by mouth 3 (three) times daily as needed for anxiety.   Yes Historical Provider, MD  DULoxetine (CYMBALTA) 60 MG capsule Take 60 mg by mouth every evening.   Yes Historical Provider, MD  escitalopram (LEXAPRO) 20 MG tablet Take 20 mg by mouth at bedtime.   Yes Historical Provider, MD  etodolac (LODINE) 500 MG tablet Take 500 mg by mouth 2 (two) times daily.   Yes Historical Provider, MD  oxyCODONE-acetaminophen (PERCOCET/ROXICET) 5-325 MG per tablet Take 1-2 tablets by mouth every 4 (four) hours as needed for severe pain. 05/08/14  Yes Marlyce Huge, MD  ranitidine (ZANTAC) 150 MG tablet Take 150 mg by mouth 2 (two) times daily.   Yes Historical Provider, MD  simvastatin (ZOCOR) 40 MG tablet Take 40 mg by mouth at bedtime.   Yes Historical Provider, MD  tiotropium (SPIRIVA) 18 MCG inhalation capsule Place 18 mcg into inhaler and inhale every morning.   Yes Historical Provider, MD  zolpidem (AMBIEN) 5 MG tablet Take 5 mg by mouth at bedtime as needed for sleep.   Yes Historical Provider, MD      VITAL SIGNS:  Blood pressure 139/63, pulse 70, temperature 98.7 F (37.1 C), temperature source Oral, resp. rate 24, height '5\' 8"'$  (1.727 m), weight 87.091 kg (192 lb), SpO2 99 %.  PHYSICAL EXAMINATION:  GENERAL:  59 y.o.-year-old patient lying in the bed with mod  distress.  EYES: Pupils equal, round, reactive to light and accommodation. No scleral icterus.  Extraocular muscles intact.  HEENT: Head atraumatic, normocephalic. Oropharynx and nasopharynx clear.  NECK:  Supple, no jugular venous distention. No thyroid enlargement, no tenderness.  LUNGS: Normal breath sounds bilaterally, no wheezing, rales,rhonchi or crepitation. No use of accessory muscles of respiration.  CARDIOVASCULAR: S1, S2 normal. No murmurs, rubs, or gallops.  ABDOMEN: Soft, nontender, nondistended. Bowel sounds present. No organomegaly or mass.  EXTREMITIES: No pedal edema, cyanosis, or clubbing.  NEUROLOGIC: Cranial nerves II through XII are intact. Muscle strength 5/5 in all extremities. Sensation intact. Gait not checked.  PSYCHIATRIC: The patient is alert and oriented x 3.  SKIN: severe large non healing rectal ulcer with sever foul smelling discharge.  LABORATORY PANEL:   CBC  Recent Labs Lab 05/17/14 1650  WBC 14.7*  HGB 13.8  HCT 41.8  PLT 280   ------------------------------------------------------------------------------------------------------------------  Chemistries   Recent Labs Lab 05/17/14 1650  NA 139  K 3.1*  CL 104  CO2 27  GLUCOSE 98  BUN 8  CREATININE 0.71  CALCIUM  8.3*   ------------------------------------------------------------------------------------------------------------------  Cardiac Enzymes No results for input(s): TROPONINI in the last 168 hours.   RADIOLOGY:  No results found.  EKG:  Not ordered  IMPRESSION AND PLAN:   59 year old [name] with multiple past medical history of schizophrenia, depression and anxiety, spinal stenosis, chronic pain, hypertension off meds comes to the emergency room  #1 rectal pain severe secondary to large nonhealing rectal ulcer status post biopsy by surgery done on 05/10/2014. Pathology results revealed invasive squamous cell carcinoma. Patient is being admitted to surgical floor for pain control, oncology evaluation, surgical opinion regarding possible colostomy..  Dr.  Reita Cliche spoke with Dr. Burt Knack from surgery to have a direct admit under surgery however he deferred it to internal medicine and recommending oncology evaluation for now. Continue IV fluids IV when necessary morphine, as needed Norco. #2 COPD appears stable. Tobacco abuse counseling done about 4 minutes spent patient appears not motivated. #3 bipolar disorder, schizophrenia continue clonazepam Cymbalta Lexapro. #4 hyperlipidemia continue simvastatin.      All the records are reviewed and case discussed with ED provider. Management plans discussed with the patient, family and they are in agreement.  CODE STATUS: full  TOTAL TIME TAKING CARE OF THIS PATIENT: 50 mins.    [name] M.D on 05/17/2014 at 6:52 PM  Between 7am to 6pm - Pager - [phone]  After 6pm go to [email or web address] - password [email or web address] [facility]  Office  [phone]  CC: Primary care physician; Lavera Guise, MD

## 2014-05-17 NOTE — ED Notes (Signed)
Pt to ED via EMS from home due to generalized weakness, rectal bleeding, and diarrhea for the past week. Pt was diagnosed with rectal cancer on Wednesday, the 4th. Pt states presence of mid lower abd pain/cramping, 10/10.  Pt denies nausea or vomiting but unable to keep any foods down for the past 3-4 days. Vitals stable on arrival, no acute distress noted.

## 2014-05-17 NOTE — ED Provider Notes (Signed)
[facility] Emergency Department Provider Note   ____________________________________________  Time seen: 5:15 PM  I have reviewed the triage vital signs and the nursing notes.   HISTORY  Chief Complaint Weakness and Rectal Bleeding    HPI [name] is a 59 y.o. female who is coming in with rectal pain constant liquid rectal drainage and a recent diagnosis of rectal cancer. Her rectum area is excoriated and exquisitely tender from the constant liquid diarrhea. She has a postop follow-up from the biopsy with general surgery on Friday and there are plans to place a port in preparation for chemotherapy and to do a colostomy. The patient is unable to continue in this state at home and needs care to be expedited. Severity is moderate to severe.      Past Medical History  Diagnosis Date  . Schizophrenia   . Asthma   . GERD (gastroesophageal reflux disease)   . Anxiety   . Depression   . Bipolar disorder   . COPD (chronic obstructive pulmonary disease)   . Occasional tremors     right hand  . PTSD (post-traumatic stress disorder)   . Shortness of breath dyspnea   . Fibromyalgia   . DVT (deep venous thrombosis) 2011    RUE  . Thyroid nodule   . DDD (degenerative disc disease), lumbar   . Spinal stenosis   . Peripheral neuropathy   . Rotator cuff tear     right  . Pneumonia 2011  . Hypothyroidism     no meds currently  . Anemia     during pregnancy only  . Hypertension     Off meds x 15 years-well controlled now per pt    There are no active problems to display for this patient.   Past Surgical History  Procedure Laterality Date  . Foot surgery Right   . Tubal ligation    . Eye surgery Bilateral   . Mouth surgery  2002  . Rectal biopsy N/A 05/08/2014    Procedure: BIOPSY RECTAL;  Surgeon: Marlyce Huge, MD;  Location: [facility] ORS;  Service: General;  Laterality: N/A;  . Evaluation under anesthesia with hemorrhoidectomy N/A  05/08/2014    Procedure: EXAM UNDER ANESTHESIA WITH HEMORRHOIDECTOMY;  Surgeon: Marlyce Huge, MD;  Location: [facility] ORS;  Service: General;  Laterality: N/A;    Current Outpatient Rx  Name  Route  Sig  Dispense  Refill  . baclofen (LIORESAL) 10 MG tablet   Oral   Take 10 mg by mouth 3 (three) times daily as needed for muscle spasms.         . budesonide-formoterol (SYMBICORT) 160-4.5 MCG/ACT inhaler   Inhalation   Inhale 2 puffs into the lungs 2 (two) times daily.         . clonazePAM (KLONOPIN) 1 MG tablet   Oral   Take 1 mg by mouth 3 (three) times daily as needed for anxiety.         . DULoxetine (CYMBALTA) 60 MG capsule   Oral   Take 60 mg by mouth every evening.         . escitalopram (LEXAPRO) 20 MG tablet   Oral   Take 20 mg by mouth at bedtime.         Marland Kitchen etodolac (LODINE) 500 MG tablet   Oral   Take 500 mg by mouth 2 (two) times daily.         Marland Kitchen oxyCODONE-acetaminophen (PERCOCET/ROXICET) 5-325 MG per tablet   Oral  Take 1-2 tablets by mouth every 4 (four) hours as needed for severe pain.   60 tablet   0   . ranitidine (ZANTAC) 150 MG tablet   Oral   Take 150 mg by mouth 2 (two) times daily.         . simvastatin (ZOCOR) 40 MG tablet   Oral   Take 40 mg by mouth at bedtime.         Marland Kitchen tiotropium (SPIRIVA) 18 MCG inhalation capsule   Inhalation   Place 18 mcg into inhaler and inhale every morning.         . traMADol (ULTRAM) 50 MG tablet   Oral   Take 50 mg by mouth every 6 (six) hours as needed.         . zolpidem (AMBIEN) 5 MG tablet   Oral   Take 5 mg by mouth at bedtime as needed for sleep.           Allergies Sulfa antibiotics  No family history on file.  Social History History  Substance Use Topics  . Smoking status: Current Every Day Smoker -- 0.25 packs/day for 44 years    Types: Cigarettes  . Smokeless tobacco: Not on file  . Alcohol Use: Yes     Comment: 1 drink every 2-3 times/year    Review of  Systems  Constitutional: Negative for fever. Eyes: Negative for visual changes. ENT: Negative for sore throat. Cardiovascular: Negative for chest pain. Respiratory: Negative for shortness of breath. Gastrointestinal: No vomiting but chronic diarrhea for approximately 3 months much worse over the past 3 weeks and pain unbearable for the past few days. Genitourinary: Negative for dysuria. Musculoskeletal: Negative for back pain. Skin: Negative for rash. Neurological: Negative for headaches, focal weakness or numbness.   10-point ROS otherwise negative.  ____________________________________________   PHYSICAL EXAM:  VITAL SIGNS: ED Triage Vitals  Enc Vitals Group     BP 05/17/14 1715 135/58 mmHg     Pulse Rate 05/17/14 1633 79     Resp 05/17/14 1633 18     Temp 05/17/14 1633 98.7 F (37.1 C)     Temp Source 05/17/14 1633 Oral     SpO2 05/17/14 1633 100 %     Weight 05/17/14 1633 192 lb (87.091 kg)     Height 05/17/14 1633 '5\' 8"'$  (1.727 m)     Head Cir --      Peak Flow --      Pain Score 05/17/14 1638 10     Pain Loc --      Pain Edu? --      Excl. in [place]? --      Constitutional: Alert and oriented. Well appearing but patient is tearful and in pain. Eyes: Conjunctivae are normal. PERRL. Normal extraocular movements. ENT   Head: Normocephalic and atraumatic.   Nose: No congestion/rhinnorhea.   Mouth/Throat: Mucous membranes are moist.   Neck: No stridor. Cardiovascular: Normal rate, regular rhythm.  No murmurs, rubs, or gallops. Respiratory: Normal respiratory effort without tachypnea nor retractions. Breath sounds are clear and equal bilaterally. No wheezes/rales/rhonchi. Gastrointestinal: Soft and nontender. No distention.  Genitourinary: Large area of skin erythematous and burning with a large hemorrhoid and actively draining yellow diarrhea Musculoskeletal: Nontender with normal range of motion in all extremities. No joint effusions.  No lower extremity  tenderness nor edema. Neurologic:  Normal speech and language. No gross focal neurologic deficits are appreciated. Speech is normal. No gait instability. Skin:  Skin is warm,  dry and intact. No rash noted. Psychiatric: Mood and affect are normal. Speech and behavior are normal. Patient exhibits appropriate insight and judgment.  ____________________________________________   EKG  Normal sinus rhythm 78 bpm. Normal axis. Narrow QRS. Q waves septally. Nonspecific ST and T-wave.  ____________________________________________   LABS (pertinent positives/negatives)  Lip with a count of 14.7 with a hemoglobin of 13.8 Electrolytes significant for a mildly low potassium at 3.1.  ____________________________________________    RADIOLOGY    ____________________________________________   PROCEDURES  Procedure(s) performed: No Critical Care performed:  No  ____________________________________________   INITIAL IMPRESSION / ASSESSMENT AND PLAN / ED COURSE  Pertinent labs & imaging results that were available during my care of the patient were reviewed by me and considered in my medical decision making (see chart for details).  Patient is in miserable condition after worsening of chronic diarrhea due to rectal sphincter dysfunction specifically after recent rectal biopsy which revealed a new rectal cancer. Although she has scheduled follow-up she needs expedited care and I will discuss with internal medicine for hospitalization to expedite her colostomy and port placement. In the meantime for her severe rectal pain she'll be given symptomatic pain and nausea medications.  ____________________________________________   FINAL CLINICAL IMPRESSION(S) / ED DIAGNOSES  Recently diagnosed rectal cancer Acute rectal pain due to postoperative biopsy and skin breakdown due to chronic diarrhea    Lisa Roca, MD 05/17/14 1731

## 2014-05-17 NOTE — Consult Note (Signed)
[name] is a 59 y.o. female  with a recently discovered squamous cell carcinoma of the rectum.  HPI: She was evaluated by one of the surgeons in our practice for possible hemorrhoid disease. He identified a large rectal ulceration to the surgery for rectal exam under anesthesia and noted a large anal cancer. The tumor extended well into her rectum. Biopsy demonstrated squamous cell carcinoma. She is in the midst of workup for that problem with the plan of obtaining an oncology evaluation for possible radiation and chemotherapy.  She's lost approximately 80 pounds over the last 6 months. She's been having significant rectal pain with drainage on a regular basis. She is having bowel movements at least once or twice a day have some formed stool which drains a watery liquid almost constantly. She presented to the emergency room today with profound weakness and increasing weight loss and anorexia she was admitted for further evaluation and possible surgical intervention for diverting colostomy.  Past Medical History  Diagnosis Date  . Schizophrenia   . Asthma   . GERD (gastroesophageal reflux disease)   . Anxiety   . Depression   . Bipolar disorder   . COPD (chronic obstructive pulmonary disease)   . Occasional tremors     right hand  . PTSD (post-traumatic stress disorder)   . Shortness of breath dyspnea   . Fibromyalgia   . DVT (deep venous thrombosis) 2011    RUE  . Thyroid nodule   . DDD (degenerative disc disease), lumbar   . Spinal stenosis   . Peripheral neuropathy   . Rotator cuff tear     right  . Pneumonia 2011  . Hypothyroidism     no meds currently  . Anemia     during pregnancy only  . Hypertension     Off meds x 15 years-well controlled now per pt   Past Surgical History  Procedure Laterality Date  . Foot surgery Right   . Tubal ligation    . Eye surgery Bilateral   . Mouth surgery  2002  . Rectal biopsy N/A 05/08/2014    Procedure: BIOPSY RECTAL;   Surgeon: Marlyce Huge, MD;  Location: [facility] ORS;  Service: General;  Laterality: N/A;  . Evaluation under anesthesia with hemorrhoidectomy N/A 05/08/2014    Procedure: EXAM UNDER ANESTHESIA WITH HEMORRHOIDECTOMY;  Surgeon: Marlyce Huge, MD;  Location: [facility] ORS;  Service: General;  Laterality: N/A;   History   Social History  . Marital Status: Single    Spouse Name: N/A  . Number of Children: N/A  . Years of Education: N/A   Social History Main Topics  . Smoking status: Current Every Day Smoker -- 0.25 packs/day for 44 years    Types: Cigarettes  . Smokeless tobacco: Not on file  . Alcohol Use: Yes     Comment: 1 drink every 2-3 times/year  . Drug Use: No     Comment: Pt denies but has + UDS for marijuana on 12-2013  . Sexual Activity: Not on file   Other Topics Concern  . None   Social History Narrative    Review of Systems: ROS 10 point review of systems was performed and the positives are noted above in the history of present illness. Specifically she denies problems with urination. She does not have any problem with significant abdominal pain.  PHYSICAL EXAM: BP 144/76 mmHg  Pulse 71  Temp(Src) 98.5 F (36.9 C) (Oral)  Resp 20  Ht '5\' 8"'$  (1.727 m)  Wt 83.689 kg (184 lb 8 oz)  BMI 28.06 kg/m2  SpO2 93%  Physical Exam HEENT exam: Normal eyes normal years with no scleral icterus  Lymph nodes: She has no cervical or axillary lymph nodes noted and no groin lymph nodes noted.  Chest exam reveals no adventitious sounds that she has normal pulmonary excursion.  Cardiac exam was normal gallops to my urinary symptoms to be normal sinus rhythm.  Abdomen is slightly distended with no abdominal tenderness or hernias masses guarding or rebound. She has active bowel sounds.  Rectal exam reveals a large ulcerated area around her rectum appears to be excoriated and markedly year. She has significant tenderness and no rectal exam was attempted.  Musculoskeletal  exam reveals some wasting without evidence of any decreased range of motion.  Neurologic exam reveals symmetrical motor sensory exam bilaterally with no cranial nerve abnormalities.  Psychiatric exam reveals normal orientation normal affect.  Skin exam reveals no lesions abrasions contusions or other abnormalities.  Impression/Plan: This woman has previously demonstrated squamous cell carcinoma of the rectum. This lesion would be best treated by neoadjuvant therapy utilizing radiation and/or chemotherapy at the discretion of the oncology service. She has been set up to see the oncology center for further intervention. Following treatment abdominal perineal resection may be an option for her. Should she continue to have significant drain symptoms at the present time we could perform a loop were diverting ostomy while she is undergoing therapy. However, there are no urgent surgical indications. She will do well with her current symptoms when she begins treatment regimen. This was discussed with her in detail.   Dia Crawford, III, MD  05/17/2014, 11:55 PM

## 2014-05-17 NOTE — ED Notes (Signed)
Care handoff entered in error for this time. Correct care hand off entered at [facility].

## 2014-05-18 DIAGNOSIS — F329 Major depressive disorder, single episode, unspecified: Secondary | ICD-10-CM

## 2014-05-18 DIAGNOSIS — K625 Hemorrhage of anus and rectum: Secondary | ICD-10-CM

## 2014-05-18 DIAGNOSIS — K219 Gastro-esophageal reflux disease without esophagitis: Secondary | ICD-10-CM

## 2014-05-18 DIAGNOSIS — F4310 Post-traumatic stress disorder, unspecified: Secondary | ICD-10-CM

## 2014-05-18 DIAGNOSIS — Z86718 Personal history of other venous thrombosis and embolism: Secondary | ICD-10-CM

## 2014-05-18 DIAGNOSIS — R159 Full incontinence of feces: Secondary | ICD-10-CM

## 2014-05-18 DIAGNOSIS — E039 Hypothyroidism, unspecified: Secondary | ICD-10-CM

## 2014-05-18 DIAGNOSIS — R634 Abnormal weight loss: Secondary | ICD-10-CM

## 2014-05-18 DIAGNOSIS — I10 Essential (primary) hypertension: Secondary | ICD-10-CM

## 2014-05-18 DIAGNOSIS — F319 Bipolar disorder, unspecified: Secondary | ICD-10-CM

## 2014-05-18 DIAGNOSIS — R0602 Shortness of breath: Secondary | ICD-10-CM

## 2014-05-18 DIAGNOSIS — J45909 Unspecified asthma, uncomplicated: Secondary | ICD-10-CM

## 2014-05-18 DIAGNOSIS — C210 Malignant neoplasm of anus, unspecified: Secondary | ICD-10-CM

## 2014-05-18 DIAGNOSIS — J449 Chronic obstructive pulmonary disease, unspecified: Secondary | ICD-10-CM

## 2014-05-18 DIAGNOSIS — M4800 Spinal stenosis, site unspecified: Secondary | ICD-10-CM

## 2014-05-18 DIAGNOSIS — M5136 Other intervertebral disc degeneration, lumbar region: Secondary | ICD-10-CM

## 2014-05-18 DIAGNOSIS — M797 Fibromyalgia: Secondary | ICD-10-CM

## 2014-05-18 DIAGNOSIS — E041 Nontoxic single thyroid nodule: Secondary | ICD-10-CM

## 2014-05-18 DIAGNOSIS — G629 Polyneuropathy, unspecified: Secondary | ICD-10-CM

## 2014-05-18 DIAGNOSIS — F209 Schizophrenia, unspecified: Secondary | ICD-10-CM

## 2014-05-18 DIAGNOSIS — G893 Neoplasm related pain (acute) (chronic): Secondary | ICD-10-CM

## 2014-05-18 DIAGNOSIS — F419 Anxiety disorder, unspecified: Secondary | ICD-10-CM

## 2014-05-18 MED ORDER — LIDOCAINE 5 % EX OINT
TOPICAL_OINTMENT | Freq: Two times a day (BID) | CUTANEOUS | Status: DC | PRN
  Administered 2014-05-18: 04:00:00 via TOPICAL
  Filled 2014-05-18: qty 35.44

## 2014-05-18 MED ORDER — POTASSIUM CHLORIDE CRYS ER 20 MEQ PO TBCR
20.0000 meq | EXTENDED_RELEASE_TABLET | Freq: Two times a day (BID) | ORAL | Status: DC
  Administered 2014-05-18 – 2014-05-23 (×11): 20 meq via ORAL
  Filled 2014-05-18 (×11): qty 1

## 2014-05-18 NOTE — Plan of Care (Signed)
Problem: Acute Pain Goal: Ability to attain medications upon leaving hospital Outcome: Progressing Continue to educate

## 2014-05-18 NOTE — Care Management Note (Signed)
Case Management Note  Patient Details  Name: [name] MRN: [id number] Date of Birth: March 15, 1955  Subjective/Objective:                   Spoke with patient for discharge planning. Patient is alert and oriented. She stated that she lives in a boarding home with approximately 16 other people. There are only 3 restroom. Patient stated that she sleeps on and air mattress. She stated that their are bed bugs in the home. Patient examined by nursing staff who were made aware and no insect bites were noted on patient. Patient stated that she intends on returning to boarding house upon discharge. She has and adult daughter that lives in the same boarding house and also an adopted daughter who resides there as well. Patient stated that she would like to get a bedside commode and a hospital bed at discharge. She will need a rolling walker. Unable to formulate a clear plan for discharge at this time due to nature of illness and awaiting oncology consult for treatment plan. Patient will need a PT consult as well to determine if rehab appropriate. Patient stated that she would not be able to get other housing because of a misdimeanor in her past. Patient receives disability compensation of about $750 monthly. [employer] Medicare and medicaid benefits.  I have spoken to CSW concerning possible options and again it will depend on treatment plan.  Action/Plan:   Expected Discharge Date:                  Expected Discharge Plan:     In-House Referral:     Discharge planning Services  CM Consult  Post Acute Care Choice:    Choice offered to:     DME Arranged:    DME Agency:  [place]:    [place] Agency:     Status of Service:     Medicare Important Message Given:    Date Medicare IM Given:    Medicare IM give by:    Date Additional Medicare IM Given:    Additional Medicare Important Message give by:     If discussed at [place] of Stay Meetings, dates discussed:     Additional Comments:  Alvie Heidelberg, RN 05/18/2014, 3:36 PM

## 2014-05-18 NOTE — Progress Notes (Signed)
Patient A/O, no noted distress. Patient experience discomfort and pain rectal area. Rectum noted to be red and appears to have hemorrhoid, as well. Patient states she stays in a boarding house that does not provided appropriate bathing area. She uses public transportation for doctor's appointment. She understands her health is decline. Patient tolerated all meds well. Administered prn regimen for pain, effective. Administered lidocaine to rectum area, by applying by  topical to 4x4 and administering to are. She noted to have BLE weakness Staff will continue to monitor and meet her needs. Patient was bath

## 2014-05-18 NOTE — Progress Notes (Signed)
[place] at [place] NAME: [name]    MR#:  [id number]  DATE OF BIRTH:  1955/08/08  SUBJECTIVE:  CHIEF COMPLAINT:   Chief Complaint  Patient presents with  . Weakness  . Rectal Bleeding   Pt. Here w/ rectal drainage and pain.  Recently diagnosed w/ rectal mass with biopsy like a week or so ago. Awaiting Oncology eval.   REVIEW OF SYSTEMS:    Review of Systems  Constitutional: Negative for fever and chills.  HENT: Negative for congestion and tinnitus.   Eyes: Negative for blurred vision and double vision.  Respiratory: Negative for cough, shortness of breath and wheezing.   Cardiovascular: Negative for chest pain, orthopnea and PND.  Gastrointestinal: Positive for abdominal pain (rectal pain). Negative for nausea, vomiting, diarrhea, blood in stool and melena.  Genitourinary: Negative for dysuria and hematuria.  Neurological: Negative for dizziness, sensory change and focal weakness.  All other systems reviewed and are negative.   Nutrition: Full liquid Tolerating Diet: Yes  DRUG ALLERGIES:   Allergies  Allergen Reactions  . Sulfa Antibiotics Rash    VITALS:  Blood pressure 137/77, pulse 71, temperature 97.9 F (36.6 C), temperature source Oral, resp. rate 18, height '5\' 8"'$  (1.727 m), weight 83.689 kg (184 lb 8 oz), SpO2 99 %.  PHYSICAL EXAMINATION:   Physical Exam  GENERAL:  59 y.o.-year-old patient lying in the bed with no acute distress.  EYES: Pupils equal, round, reactive to light and accommodation. No scleral icterus. Extraocular muscles intact.  HEENT: Head atraumatic, normocephalic. Oropharynx and nasopharynx clear.  NECK:  Supple, no jugular venous distention. No thyroid enlargement, no tenderness.  LUNGS: Normal breath sounds bilaterally, no wheezing, rales,rhonchi. No use of accessory muscles of respiration.  CARDIOVASCULAR: S1, S2 normal. No murmurs, rubs, or gallops.  ABDOMEN: Soft, nontender,  nondistended. Bowel sounds present. No organomegaly or mass.  EXTREMITIES: No cyanosis, clubbing or edema b/l.    NEUROLOGIC: Cranial nerves II through XII are intact. No focal Motor or sensory deficits b/l.   PSYCHIATRIC: The patient is alert and oriented x 3.  SKIN: No obvious rash, lesion, or ulcer.  Rectum - + mass noted w/ clear drainage around it.  Foul smelling drainage.    LABORATORY PANEL:   CBC  Recent Labs Lab 05/17/14 1650  WBC 14.7*  HGB 13.8  HCT 41.8  PLT 280   ------------------------------------------------------------------------------------------------------------------  Chemistries   Recent Labs Lab 05/17/14 1650  NA 139  K 3.1*  CL 104  CO2 27  GLUCOSE 98  BUN 8  CREATININE 0.71  CALCIUM 8.3*   ------------------------------------------------------------------------------------------------------------------  Cardiac Enzymes No results for input(s): TROPONINI in the last 168 hours. ------------------------------------------------------------------------------------------------------------------  RADIOLOGY:  No results found.   ASSESSMENT AND PLAN:   59 yo female w/ hx of schizophrenia, COPD, Bipolaris disorder, Depression, Fibromyalgia, PTSD, hx of DVT, Hypothyroidism, HTN came into hospital due to rectal pain, drainage and diarrhea and pain in the area.   1.  Rectal Ulcer/Cancer - likely cause of pt's drainage and pain.  - cont. Supportive care w/ pain meds w/ Morphine, Norco PRN for pain.  - seen by surgery and no need for urgent surgical indication.  - await Oncology eval and likely needs a combination of chemo/radiation.  Discussed w/ Dr. Mike Gip.   - will discuss w/ surgery to see if they can place port tomorrow to start chemo as outpatient in near future.   2.  COPD - no acute  exacerbation.  - cont. Symbicort, Spiriva.   3. Depression - cont. Lexapro, Cymbalta.   4. Hypokalemia - will place on oral supplements and repeat in a.m    5. Anxiety - cont. Klonopin.   6. Hyperlipidemia - cont. Simvastatin.     All the records are reviewed and case discussed with Care Management/Social Workerr. Management plans discussed with the patient, family and they are in agreement.  CODE STATUS: Full  DVT Prophylaxis: Heparin SQ  TOTAL TIME TAKING CARE OF THIS PATIENT: 30 minutes.   POSSIBLE D/C IN 1-2 DAYS, DEPENDING ON CLINICAL CONDITION.   Henreitta Leber M.D on 05/18/2014 at 11:00 AM  Between 7am to 6pm - Pager - [phone]  After 6pm go to [email or web address] - password [email or web address]  Office  [phone]  CC: Primary care physician; Lavera Guise, MD

## 2014-05-19 ENCOUNTER — Inpatient Hospital Stay: Payer: Commercial Managed Care - HMO

## 2014-05-19 DIAGNOSIS — C210 Malignant neoplasm of anus, unspecified: Secondary | ICD-10-CM | POA: Insufficient documentation

## 2014-05-19 LAB — BASIC METABOLIC PANEL
Anion gap: 9 (ref 5–15)
BUN: 5 mg/dL — ABNORMAL LOW (ref 6–20)
CALCIUM: 8.7 mg/dL — AB (ref 8.9–10.3)
CO2: 30 mmol/L (ref 22–32)
Chloride: 100 mmol/L — ABNORMAL LOW (ref 101–111)
Creatinine, Ser: 0.73 mg/dL (ref 0.44–1.00)
Glucose, Bld: 89 mg/dL (ref 65–99)
Potassium: 3.6 mmol/L (ref 3.5–5.1)
SODIUM: 139 mmol/L (ref 135–145)

## 2014-05-19 MED ORDER — IOHEXOL 240 MG/ML SOLN
50.0000 mL | INTRAMUSCULAR | Status: DC | PRN
  Administered 2014-05-19: 50 mL via ORAL
  Filled 2014-05-19: qty 50

## 2014-05-19 MED ORDER — CEFAZOLIN (ANCEF) 1 G IV SOLR
1.0000 g | INTRAVENOUS | Status: AC
  Administered 2014-05-20: 1 g
  Filled 2014-05-19: qty 1

## 2014-05-19 MED ORDER — IOHEXOL 300 MG/ML  SOLN
100.0000 mL | Freq: Once | INTRAMUSCULAR | Status: AC | PRN
  Administered 2014-05-19: 100 mL via INTRAVENOUS

## 2014-05-19 MED ORDER — DOCUSATE SODIUM 100 MG PO CAPS
100.0000 mg | ORAL_CAPSULE | Freq: Two times a day (BID) | ORAL | Status: DC
  Administered 2014-05-19 – 2014-05-29 (×16): 100 mg via ORAL
  Filled 2014-05-19 (×20): qty 1

## 2014-05-19 NOTE — Progress Notes (Signed)
Patient A/O, no noted distress. Patient is pleasant. She satisfied with the potential the MDs have propose. She notes last night was the best night in a long time she was able to sleep. She continues have pain. Administered po pain regimen, effective. Patient prefer oxycodone vs the other pain regimen. Ambulates to the bathroom with standby assist. Staff will continue to monitor and meet patient needs.

## 2014-05-19 NOTE — Progress Notes (Signed)
[place] at [place] NAME: [name]    MR#:  [id number]  DATE OF BIRTH:  1955-05-29  SUBJECTIVE:  CHIEF COMPLAINT:   Chief Complaint  Patient presents with  . Weakness  . Rectal Bleeding   Pt. Here w/ rectal drainage and pain.  Recently diagnosed w/ rectal mass with biopsy like a week or so ago. Pain has improved overnight.  Seen by Oncology and appreciate input. Discussed plan w/ Dr. Burt Knack for possible need for diverting colostomy.   REVIEW OF SYSTEMS:    Review of Systems  Constitutional: Negative for fever and chills.  HENT: Negative for congestion and tinnitus.   Eyes: Negative for blurred vision and double vision.  Respiratory: Negative for cough, shortness of breath and wheezing.   Cardiovascular: Negative for chest pain, orthopnea and PND.  Gastrointestinal: Negative for nausea, vomiting, abdominal pain, diarrhea and blood in stool.  Genitourinary: Negative for dysuria and hematuria.  Neurological: Negative for dizziness, sensory change and focal weakness.    Nutrition: Full liquid Tolerating Diet: Yes  DRUG ALLERGIES:   Allergies  Allergen Reactions  . Sulfa Antibiotics Rash    VITALS:  Blood pressure 111/50, pulse 64, temperature 97.8 F (36.6 C), temperature source Oral, resp. rate 17, height '5\' 8"'$  (1.727 m), weight 83.689 kg (184 lb 8 oz), SpO2 97 %.  PHYSICAL EXAMINATION:   Physical Exam  GENERAL:  59 y.o.-year-old patient lying in the bed with no acute distress.  EYES: Pupils equal, round, reactive to light and accommodation. No scleral icterus. Extraocular muscles intact.  HEENT: Head atraumatic, normocephalic. Oropharynx and nasopharynx clear.  NECK:  Supple, no jugular venous distention. No thyroid enlargement, no tenderness.  LUNGS: Normal breath sounds bilaterally, no wheezing, rales,rhonchi. No use of accessory muscles of respiration.  CARDIOVASCULAR: S1, S2 normal. No murmurs, rubs, or  gallops.  ABDOMEN: Soft, nontender, nondistended. Bowel sounds present. No organomegaly or mass.  EXTREMITIES: No cyanosis, clubbing or edema b/l.    NEUROLOGIC: Cranial nerves II through XII are intact. No focal Motor or sensory deficits b/l.   PSYCHIATRIC: The patient is alert and oriented x 3.  SKIN: No obvious rash, lesion, or ulcer.  Rectum - + mass noted w/ clear drainage around it.  Foul smelling drainage.    LABORATORY PANEL:   CBC  Recent Labs Lab 05/17/14 1650  WBC 14.7*  HGB 13.8  HCT 41.8  PLT 280   ------------------------------------------------------------------------------------------------------------------  Chemistries   Recent Labs Lab 05/19/14 0646  NA 139  K 3.6  CL 100*  CO2 30  GLUCOSE 89  BUN 5*  CREATININE 0.73  CALCIUM 8.7*   ------------------------------------------------------------------------------------------------------------------  Cardiac Enzymes No results for input(s): TROPONINI in the last 168 hours. ------------------------------------------------------------------------------------------------------------------  RADIOLOGY:  Ct Chest W Contrast  05/19/2014   CLINICAL DATA:  Anal cancer.  Staging  EXAM: CT CHEST, ABDOMEN, AND PELVIS WITH CONTRAST  TECHNIQUE: Multidetector CT imaging of the chest, abdomen and pelvis was performed following the standard protocol during bolus administration of intravenous contrast.  CONTRAST:  157m OMNIPAQUE IOHEXOL 300 MG/ML  SOLN  COMPARISON:  None.  FINDINGS: CT CHEST FINDINGS  Mediastinum: The heart size is normal. There is no pericardial effusion identified. Aortic atherosclerosis noted. The trachea appears patent and is midline. Normal appearance of the esophagus. No enlarged mediastinal or hilar lymph nodes.  Lungs/Pleura: No pleural effusion. Subsegmental atelectasis is noted in the right lung base, image 39 of series 5. Mild changes of centrilobular  emphysema. Small right upper lobe nodule  measures 4 mm, image 22/series 5.  Musculoskeletal: There is no aggressive lytic or sclerotic bone lesions. Chronic appearing compression fractures involve T6, T9, T10 and T12.  CT ABDOMEN AND PELVIS FINDINGS  Hepatobiliary: No focal liver abnormality. The gallbladder appears normal. No biliary dilatation.  Pancreas: Negative  Spleen: Negative  Adrenals/Urinary Tract: The adrenal glands are negative normal appearance of the left kidney. 8 mm low attenuation structure in the right iliac bone is noted and is too small to characterize.  Stomach/Bowel: The stomach is within normal limits. The small bowel loops have a normal course and caliber. No obstruction. Lower rectal/ anal mass is identified. This measures approximately 4.7 x 4.1 x 6.0 cm.  Vascular/Lymphatic: Calcified atherosclerotic disease involves the abdominal aorta. The abdominal aorta measures 3.7 cm in AP dimension, image 73/series 2. No upper abdominal adenopathy. There is a left common iliac lymph node measuring 8 mm, image 90/series 2. Left external iliac node measures 7 mm, image 100/series 2. Right inguinal node measures 1.3 cm, image 51/series 7.  Reproductive: The uterus appears normal. Bilateral tubal ligation. Adnexal structures are unremarkable.  Other: There is no ascites or focal fluid collections within the abdomen or pelvis.  Musculoskeletal: Review of the visualized bony structures is negative for aggressive lytic or sclerotic bone lesion.  IMPRESSION: 1. No specific features identified to suggest metastatic disease to the chest. Small pulmonary nodule in the right upper lobe measures 4 mm an warrants attention on followup imaging. 2. No evidence for upper abdominal metastasis. 3. Borderline enlarged iliac lymph nodes are identified. Within the right inguinal region there is an enlarged node which measures 13 mm. Cannot rule out metastatic adenopathy. 4. Large lower rectal and anal mass compatible with the clinical history of anal cancer. 5.  Aortic atherosclerosis 6. Emphysema 7. Multi level thoracic compression deformities.   Electronically Signed   By: Kerby Moors M.D.   On: 05/19/2014 09:30   Ct Abdomen Pelvis W Contrast  05/19/2014   CLINICAL DATA:  Anal cancer.  Staging  EXAM: CT CHEST, ABDOMEN, AND PELVIS WITH CONTRAST  TECHNIQUE: Multidetector CT imaging of the chest, abdomen and pelvis was performed following the standard protocol during bolus administration of intravenous contrast.  CONTRAST:  173m OMNIPAQUE IOHEXOL 300 MG/ML  SOLN  COMPARISON:  None.  FINDINGS: CT CHEST FINDINGS  Mediastinum: The heart size is normal. There is no pericardial effusion identified. Aortic atherosclerosis noted. The trachea appears patent and is midline. Normal appearance of the esophagus. No enlarged mediastinal or hilar lymph nodes.  Lungs/Pleura: No pleural effusion. Subsegmental atelectasis is noted in the right lung base, image 39 of series 5. Mild changes of centrilobular emphysema. Small right upper lobe nodule measures 4 mm, image 22/series 5.  Musculoskeletal: There is no aggressive lytic or sclerotic bone lesions. Chronic appearing compression fractures involve T6, T9, T10 and T12.  CT ABDOMEN AND PELVIS FINDINGS  Hepatobiliary: No focal liver abnormality. The gallbladder appears normal. No biliary dilatation.  Pancreas: Negative  Spleen: Negative  Adrenals/Urinary Tract: The adrenal glands are negative normal appearance of the left kidney. 8 mm low attenuation structure in the right iliac bone is noted and is too small to characterize.  Stomach/Bowel: The stomach is within normal limits. The small bowel loops have a normal course and caliber. No obstruction. Lower rectal/ anal mass is identified. This measures approximately 4.7 x 4.1 x 6.0 cm.  Vascular/Lymphatic: Calcified atherosclerotic disease involves the abdominal aorta. The abdominal  aorta measures 3.7 cm in AP dimension, image 73/series 2. No upper abdominal adenopathy. There is a left  common iliac lymph node measuring 8 mm, image 90/series 2. Left external iliac node measures 7 mm, image 100/series 2. Right inguinal node measures 1.3 cm, image 51/series 7.  Reproductive: The uterus appears normal. Bilateral tubal ligation. Adnexal structures are unremarkable.  Other: There is no ascites or focal fluid collections within the abdomen or pelvis.  Musculoskeletal: Review of the visualized bony structures is negative for aggressive lytic or sclerotic bone lesion.  IMPRESSION: 1. No specific features identified to suggest metastatic disease to the chest. Small pulmonary nodule in the right upper lobe measures 4 mm an warrants attention on followup imaging. 2. No evidence for upper abdominal metastasis. 3. Borderline enlarged iliac lymph nodes are identified. Within the right inguinal region there is an enlarged node which measures 13 mm. Cannot rule out metastatic adenopathy. 4. Large lower rectal and anal mass compatible with the clinical history of anal cancer. 5. Aortic atherosclerosis 6. Emphysema 7. Multi level thoracic compression deformities.   Electronically Signed   By: Kerby Moors M.D.   On: 05/19/2014 09:30     ASSESSMENT AND PLAN:   59 yo female w/ hx of schizophrenia, COPD, Bipolaris disorder, Depression, Fibromyalgia, PTSD, hx of DVT, Hypothyroidism, HTN came into hospital due to rectal pain, drainage and diarrhea and pain in the area.   1.  Rectal Ulcer/Cancer - likely cause of pt's drainage and pain.  - cont. Supportive care w/ pain meds w/ Morphine, Norco PRN for pain.  Pain has improved.  - seen by Oncology and discussed w/ Dr. Mike Gip and case was discussed at Tumor board yesterday.  Pt. Likely need diverting colostomy w/ adjuvant chemo/radiation and also likely a port for chemo.  - discussed with surgery (dR. Cooper) and he is discuss w/ Dr. Mike Gip and then talk to pt.  - had CT abd/pelvis/chest for staging and noted to have iliac nodes but no other evidence of  metastatic disease.  - cont. Supportive care for now.   2.  COPD - no acute exacerbation.  - cont. Symbicort, Spiriva.   3. Depression - cont. Lexapro, Cymbalta.   4. Hypokalemia - improved w/ supplementation.   5. Anxiety - cont. Klonopin.   6. Hyperlipidemia - cont. Simvastatin.    All the records are reviewed and case discussed with Care Management/Social Workerr. Management plans discussed with the patient, family and they are in agreement.  CODE STATUS: Full  DVT Prophylaxis: Heparin SQ  TOTAL TIME TAKING CARE OF THIS PATIENT: 30 minutes.   Plan discussed w/ Dr. Allena Earing. Burt Knack.    Henreitta Leber M.D on 05/19/2014 at 3:12 PM  Between 7am to 6pm - Pager - [phone]  After 6pm go to [email or web address] - password [email or web address] [facility]  Office  [phone]  CC: Primary care physician; Lavera Guise, MD

## 2014-05-19 NOTE — Progress Notes (Signed)
CC: Anal cancer Subjective: This is a patient being evaluated for treatment of anal cancer biopsy proven. Her biggest problem is drainage and anal pain. She has had a prior biopsy showing anal cancer. She has been seen by oncology. Currently she has no other symptoms.  Her surgical history is reviewed. She has never broken her collarbone.  Objective: Vital signs in last 24 hours: Temp:  [97.8 F (36.6 C)-98.1 F (36.7 C)] 97.8 F (36.6 C) (05/13 0815) Pulse Rate:  [64-65] 64 (05/13 0815) Resp:  [17-20] 17 (05/13 0815) BP: (111-128)/(50-70) 111/50 mmHg (05/13 0815) SpO2:  [97 %] 97 % (05/13 0815) Last BM Date: 05/18/14  Intake/Output from previous day: 05/12 0701 - 05/13 0700 In: 360 [P.O.:360] Out: -  Intake/Output this shift: Total I/O In: 3060 [P.O.:3060] Out: 1125 [Urine:1125]  Physical exam:  Abdomen is soft and nontender.  Calves are nontender  Lab Results: CBC   Recent Labs  05/17/14 1650  WBC 14.7*  HGB 13.8  HCT 41.8  PLT 280   BMET  Recent Labs  05/17/14 1650 05/19/14 0646  NA 139 139  K 3.1* 3.6  CL 104 100*  CO2 27 30  GLUCOSE 98 89  BUN 8 5*  CREATININE 0.71 0.73  CALCIUM 8.3* 8.7*   PT/INR No results for input(s): LABPROT, INR in the last 72 hours. ABG No results for input(s): PHART, HCO3 in the last 72 hours.  Invalid input(s): PCO2, PO2  Studies/Results: Ct Chest W Contrast  05/19/2014   CLINICAL DATA:  Anal cancer.  Staging  EXAM: CT CHEST, ABDOMEN, AND PELVIS WITH CONTRAST  TECHNIQUE: Multidetector CT imaging of the chest, abdomen and pelvis was performed following the standard protocol during bolus administration of intravenous contrast.  CONTRAST:  143m OMNIPAQUE IOHEXOL 300 MG/ML  SOLN  COMPARISON:  None.  FINDINGS: CT CHEST FINDINGS  Mediastinum: The heart size is normal. There is no pericardial effusion identified. Aortic atherosclerosis noted. The trachea appears patent and is midline. Normal appearance of the esophagus. No  enlarged mediastinal or hilar lymph nodes.  Lungs/Pleura: No pleural effusion. Subsegmental atelectasis is noted in the right lung base, image 39 of series 5. Mild changes of centrilobular emphysema. Small right upper lobe nodule measures 4 mm, image 22/series 5.  Musculoskeletal: There is no aggressive lytic or sclerotic bone lesions. Chronic appearing compression fractures involve T6, T9, T10 and T12.  CT ABDOMEN AND PELVIS FINDINGS  Hepatobiliary: No focal liver abnormality. The gallbladder appears normal. No biliary dilatation.  Pancreas: Negative  Spleen: Negative  Adrenals/Urinary Tract: The adrenal glands are negative normal appearance of the left kidney. 8 mm low attenuation structure in the right iliac bone is noted and is too small to characterize.  Stomach/Bowel: The stomach is within normal limits. The small bowel loops have a normal course and caliber. No obstruction. Lower rectal/ anal mass is identified. This measures approximately 4.7 x 4.1 x 6.0 cm.  Vascular/Lymphatic: Calcified atherosclerotic disease involves the abdominal aorta. The abdominal aorta measures 3.7 cm in AP dimension, image 73/series 2. No upper abdominal adenopathy. There is a left common iliac lymph node measuring 8 mm, image 90/series 2. Left external iliac node measures 7 mm, image 100/series 2. Right inguinal node measures 1.3 cm, image 51/series 7.  Reproductive: The uterus appears normal. Bilateral tubal ligation. Adnexal structures are unremarkable.  Other: There is no ascites or focal fluid collections within the abdomen or pelvis.  Musculoskeletal: Review of the visualized bony structures is negative for aggressive lytic  or sclerotic bone lesion.  IMPRESSION: 1. No specific features identified to suggest metastatic disease to the chest. Small pulmonary nodule in the right upper lobe measures 4 mm an warrants attention on followup imaging. 2. No evidence for upper abdominal metastasis. 3. Borderline enlarged iliac lymph  nodes are identified. Within the right inguinal region there is an enlarged node which measures 13 mm. Cannot rule out metastatic adenopathy. 4. Large lower rectal and anal mass compatible with the clinical history of anal cancer. 5. Aortic atherosclerosis 6. Emphysema 7. Multi level thoracic compression deformities.   Electronically Signed   By: Kerby Moors M.D.   On: 05/19/2014 09:30   Ct Abdomen Pelvis W Contrast  05/19/2014   CLINICAL DATA:  Anal cancer.  Staging  EXAM: CT CHEST, ABDOMEN, AND PELVIS WITH CONTRAST  TECHNIQUE: Multidetector CT imaging of the chest, abdomen and pelvis was performed following the standard protocol during bolus administration of intravenous contrast.  CONTRAST:  138m OMNIPAQUE IOHEXOL 300 MG/ML  SOLN  COMPARISON:  None.  FINDINGS: CT CHEST FINDINGS  Mediastinum: The heart size is normal. There is no pericardial effusion identified. Aortic atherosclerosis noted. The trachea appears patent and is midline. Normal appearance of the esophagus. No enlarged mediastinal or hilar lymph nodes.  Lungs/Pleura: No pleural effusion. Subsegmental atelectasis is noted in the right lung base, image 39 of series 5. Mild changes of centrilobular emphysema. Small right upper lobe nodule measures 4 mm, image 22/series 5.  Musculoskeletal: There is no aggressive lytic or sclerotic bone lesions. Chronic appearing compression fractures involve T6, T9, T10 and T12.  CT ABDOMEN AND PELVIS FINDINGS  Hepatobiliary: No focal liver abnormality. The gallbladder appears normal. No biliary dilatation.  Pancreas: Negative  Spleen: Negative  Adrenals/Urinary Tract: The adrenal glands are negative normal appearance of the left kidney. 8 mm low attenuation structure in the right iliac bone is noted and is too small to characterize.  Stomach/Bowel: The stomach is within normal limits. The small bowel loops have a normal course and caliber. No obstruction. Lower rectal/ anal mass is identified. This measures  approximately 4.7 x 4.1 x 6.0 cm.  Vascular/Lymphatic: Calcified atherosclerotic disease involves the abdominal aorta. The abdominal aorta measures 3.7 cm in AP dimension, image 73/series 2. No upper abdominal adenopathy. There is a left common iliac lymph node measuring 8 mm, image 90/series 2. Left external iliac node measures 7 mm, image 100/series 2. Right inguinal node measures 1.3 cm, image 51/series 7.  Reproductive: The uterus appears normal. Bilateral tubal ligation. Adnexal structures are unremarkable.  Other: There is no ascites or focal fluid collections within the abdomen or pelvis.  Musculoskeletal: Review of the visualized bony structures is negative for aggressive lytic or sclerotic bone lesion.  IMPRESSION: 1. No specific features identified to suggest metastatic disease to the chest. Small pulmonary nodule in the right upper lobe measures 4 mm an warrants attention on followup imaging. 2. No evidence for upper abdominal metastasis. 3. Borderline enlarged iliac lymph nodes are identified. Within the right inguinal region there is an enlarged node which measures 13 mm. Cannot rule out metastatic adenopathy. 4. Large lower rectal and anal mass compatible with the clinical history of anal cancer. 5. Aortic atherosclerosis 6. Emphysema 7. Multi level thoracic compression deformities.   Electronically Signed   By: TKerby MoorsM.D.   On: 05/19/2014 09:30    Anti-infectives: Anti-infectives    Start     Dose/Rate Route Frequency Ordered Stop   05/20/14 0000  ceFAZolin (ANCEF)  powder 1 g     1 g Other To Surgery 05/19/14 1749 05/21/14 0000      Assessment/Plan:  I discussed this patient with Dr. Rexene Edison Dr. Pat Patrick and Dr. Verdell Carmine. I have been waiting for a telephone call from Dr. Mike Gip to discuss surgical plans.  The patient requires a port for chemotherapy and she will require a loop colostomy or ileostomy for diversion. These 2 procedures cannot be performed at the same time due to the  risk of infection of the port. Which procedures performed first is probably immaterial and as I have not heard back from Dr. Kem Parkinson to determine her preference I have discussed this with the patient. I described both procedures for the patient in great detail. She wishes to proceed with port first and I will schedule that for tomorrow. If the port is successful tomorrow she can easily have her chemotherapy performed on Monday as a first first treatment. And a colostomy could  be performed midweek by Dr. Rexene Edison.   This was discussed with the patient and she was in agreement. The procedure was described in detail. The options of observation were reviewed. The risks of bleeding infection inability to use the port pneumothorax hemopneumothorax thrombosis any of which could require further surgery port removal or chest tube placement was discussed. Questions were answered for her. She understood and agreed to proceed with port placement with fluoroscopy tomorrow morning. Orders have been entered.  Florene Glen, MD, FACS  05/19/2014

## 2014-05-19 NOTE — Progress Notes (Signed)
[facility] Hematology/Oncology Consult  Date of Admission:  05/17/2014  Date of Consult:  05/18/2014  Chief Complaint: [name] is an 59 y.o. female with squamous cell carcinoma of the anus who was admitted with severe pain.  HPI: The patient notes a history of hemorrhoidal problems for several months.  She used several topical preparations without improvement in symptoms.  She saw Dr Rexene Edison who discussed possible hemorroidectomy.  She was then lost to follow-up secondary to back issues.  She states that symptoms progressed and included rawness, bleeding, and stool leakage for the past 2 months.  She describes a sensation of "burning like a torch".  Bowel movements have become liquid and constant (liquid seepage every 5 minutes and going through 3 rolls of toilet paper a day).  She sought re-evaluation by Dr. Rexene Edison.  He noted a large non-healing right anal ulcer. On 05/08/2014, she underwent exam under anesthesia and biopsy of the right sided anal mass.  The mass encompassed 50% of her right anus and extended from the opening to approximately 10 cm internal.  It was firm, fixed and had significant necrosis.  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  Tumor invaded into the muscularis propria and involved the perianal skin.  She has lost 80 pounds in the past 6 months.  HIV testing was negative years ago.  She notes that she has not had a gynecologic exam in more than 4-5 years.  She has never had a colonoscopy.  Past Medical History  Diagnosis Date  . Schizophrenia   . Asthma   . GERD (gastroesophageal reflux disease)   . Anxiety   . Depression   . Bipolar disorder   . COPD (chronic obstructive pulmonary disease)   . Occasional tremors     right hand  . PTSD (post-traumatic stress disorder)   . Shortness of breath dyspnea   . Fibromyalgia   . DVT (deep venous thrombosis) 2011     RUE  . Thyroid nodule   . DDD (degenerative disc disease), lumbar   . Spinal stenosis   . Peripheral neuropathy   . Rotator cuff tear     right  . Pneumonia 2011  . Hypothyroidism     no meds currently  . Anemia     during pregnancy only  . Hypertension     Off meds x 15 years-well controlled now per pt    Past Surgical History  Procedure Laterality Date  . Foot surgery Right   . Tubal ligation    . Eye surgery Bilateral   . Mouth surgery  2002  . Rectal biopsy N/A 05/08/2014    Procedure: BIOPSY RECTAL;  Surgeon: Marlyce Huge, MD;  Location: [facility] ORS;  Service: General;  Laterality: N/A;  . Evaluation under anesthesia with hemorrhoidectomy N/A 05/08/2014    Procedure: EXAM UNDER ANESTHESIA WITH HEMORRHOIDECTOMY;  Surgeon: Marlyce Huge, MD;  Location: [facility] ORS;  Service: General;  Laterality: N/A;   Family history:  Mother died with sepsis, kidney failure, and diabetes.  Father is deceased (unknown medical history).  She has no siblings.  She has 4 children (1 daughter died in a motor vehicle accident).  Maternal aunt with throat cancer.  Social History:  reports that she has been smoking Cigarettes.  She has a 11 pack-year smoking history. She does not have any smokeless tobacco history on file. She reports that she drinks alcohol. She reports that she does not use illicit drugs.  She previously smoked 2 packs a day for 25-30 years.  She now smokes a pack every 3 days.  She states that she will be drug free for almost 16 years on 08/21/2014.  She previously used crack cocaine.  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  She lives in a boarding house.  She denies any exposure to radiation or toxins.  Allergies:  Allergies  Allergen Reactions  . Sulfa Antibiotics Rash    Medications Prior to Admission  Medication Sig Dispense Refill  . albuterol (PROVENTIL HFA;VENTOLIN HFA) 108 (90 BASE) MCG/ACT inhaler Inhale 2 puffs  into the lungs every 6 (six) hours as needed for wheezing or shortness of breath.    . budesonide-formoterol (SYMBICORT) 160-4.5 MCG/ACT inhaler Inhale 2 puffs into the lungs 2 (two) times daily.    . clonazePAM (KLONOPIN) 1 MG tablet Take 1 mg by mouth 3 (three) times daily as needed for anxiety.    . DULoxetine (CYMBALTA) 60 MG capsule Take 60 mg by mouth every evening.    . escitalopram (LEXAPRO) 20 MG tablet Take 20 mg by mouth at bedtime.    Marland Kitchen etodolac (LODINE) 500 MG tablet Take 500 mg by mouth 2 (two) times daily.    Marland Kitchen oxyCODONE-acetaminophen (PERCOCET/ROXICET) 5-325 MG per tablet Take 1-2 tablets by mouth every 4 (four) hours as needed for severe pain. 60 tablet 0  . ranitidine (ZANTAC) 150 MG tablet Take 150 mg by mouth 2 (two) times daily.    . simvastatin (ZOCOR) 40 MG tablet Take 40 mg by mouth at bedtime.    Marland Kitchen tiotropium (SPIRIVA) 18 MCG inhalation capsule Place 18 mcg into inhaler and inhale every morning.    . zolpidem (AMBIEN) 5 MG tablet Take 5 mg by mouth at bedtime as needed for sleep.     Review of Systems:  GENERAL:  Fatigue.  No fevers or sweats.  Weight loss of 80 pounds in 6 months. PERFORMANCE STATUS (ECOG):  1-2 HEENT:  No visual changes, runny nose, sore throat, mouth sores or tenderness. Lungs: No shortness of breath or cough.  No hemoptysis. Cardiac:  No chest pain, palpitations, orthopnea, or PND. GI:  Constant stool leakage.  Rectal pain and bleeding. No nausea, vomiting, constipation, or melena. GU:  No urgency, frequency, dysuria, or hematuria. Musculoskeletal:  No back pain.  No joint pain.  No muscle tenderness. Extremities:  No pain or swelling. Skin:  No rashes or skin changes. Neuro:  General weakness. No headache, numbness or weakness, balance or coordination issues.  Carpal tunnel. Endocrine:  No diabetes, thyroid issues, hot flashes or night sweats. Psych:  No mood changes, depression or anxiety. Pain:  No focal pain. Review of systems:  All other  systems reviewed and found to be negative.   Physical Exam:  Blood pressure 128/70, pulse 65, temperature 98.1 F (36.7 C), temperature source Oral, resp. rate 20, height 5' 8"  (1.727 m), weight 184 lb 8 oz (83.689 kg), SpO2 97 %.  GENERAL:  Chronically fatigued appearing woman lying comfortably on the medical unit in no acute distress.  She appears older than stated age woman .  Foul smelling room secondary to leaking stool and necrotic tumor. MENTAL STATUS:  Alert and oriented to person, place and time. HEAD:  White hair.  Normocephalic, atraumatic, face symmetric, no Cushingoid features. EYES:  Blue eyes.  Pupils equal round and reactive to light and accomodation.  No conjunctivitis or scleral icterus. ENT:  Oropharynx clear  without lesion.  Tongue normal.  Mucous membranes moist.  RESPIRATORY:  Clear to auscultation without rales, wheezes or rhonchi. CARDIOVASCULAR:  Regular rate and rhythm without murmur, rub or gallop. ABDOMEN:  Soft, non-tender, with active bowel sounds, and no hepatosplenomegaly.  No masses. RECTAL:  Large 4 cm cauliflower mass with ulceration in right perirectal area with surrounding firm induration.  On palpation leakage of liquid brown stool. SKIN:  No rashes, ulcers or lesions. EXTREMITIES: No edema, no skin discoloration or tenderness.  No palpable cords. LYMPH NODES: No palpable cervical, supraclavicular, axillary or inguinal adenopathy  NEUROLOGICAL: Unremarkable. PSYCH:  Appropriate.  Results for orders placed or performed during the hospital encounter of 05/17/14 (from the past 48 hour(s))  CBC     Status: Abnormal   Collection Time: 05/17/14  4:50 PM  Result Value Ref Range   WBC 14.7 (H) 3.6 - 11.0 K/uL   RBC 4.64 3.80 - 5.20 MIL/uL   Hemoglobin 13.8 12.0 - 16.0 g/dL   HCT 41.8 35.0 - 47.0 %   MCV 90.1 80.0 - 100.0 fL   MCH 29.8 26.0 - 34.0 pg   MCHC 33.0 32.0 - 36.0 g/dL   RDW 14.9 (H) 11.5 - 14.5 %   Platelets 280 150 - 440 K/uL  Basic metabolic  panel     Status: Abnormal   Collection Time: 05/17/14  4:50 PM  Result Value Ref Range   Sodium 139 135 - 145 mmol/L   Potassium 3.1 (L) 3.5 - 5.1 mmol/L   Chloride 104 101 - 111 mmol/L   CO2 27 22 - 32 mmol/L   Glucose, Bld 98 65 - 99 mg/dL   BUN 8 6 - 20 mg/dL   Creatinine, Ser 0.71 0.44 - 1.00 mg/dL   Calcium 8.3 (L) 8.9 - 10.3 mg/dL   GFR calc non Af Amer >60 >60 mL/min   GFR calc Af Amer >60 >60 mL/min    Comment: (NOTE) The eGFR has been calculated using the CKD EPI equation. This calculation has not been validated in all clinical situations. eGFR's persistently <60 mL/min signify possible Chronic Kidney Disease.    Anion gap 8 5 - 15   Assessment:  The patient is a 59 y.o. 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  Plan:   1)  Discuss with patient diagnosis of anal cancer.  Discuss staging studies (chest, abdomen, and pelvic CT scan).  Discuss diverting colostomy to allow uncomplicated treatment with concurrent chemotherapy and radiation.  Discuss port-a-cath placement.  Discuss GYN exam for completeness.  Discuss testing for HIV disease (patient consented).  2)  Labs:  HIV testing 3)  Chest, abdomen, and pelvic CT scan - staging anal cancer. 4)  GYN consultation for exam. 5)  Surgical consultation for diverting colostomy.  Discussed consensus at tumor board 05/18/2014. 6)  Anticipate port-a-cath placement for chemotherapy (5FU and mitomycin).  Side effects of chemotherapy reviewed with patient.  Thank you for allowing me to participate in Ms Rouch's care.  I will follow her closely with you while hospitalized and as an outpatient.  Lequita Asal, MD  05/19/2014, 3:31 AM

## 2014-05-20 ENCOUNTER — Inpatient Hospital Stay: Payer: Commercial Managed Care - HMO | Admitting: Registered Nurse

## 2014-05-20 ENCOUNTER — Encounter
Admission: EM | Disposition: A | Discharge status: Home / Self Care | Payer: Self-pay | Source: Home / Self Care | Attending: Surgery

## 2014-05-20 ENCOUNTER — Inpatient Hospital Stay: Payer: Commercial Managed Care - HMO

## 2014-05-20 HISTORY — PX: PORTACATH PLACEMENT: SHX2246

## 2014-05-20 LAB — BASIC METABOLIC PANEL
Anion gap: 5 (ref 5–15)
BUN: 6 mg/dL (ref 6–20)
CO2: 30 mmol/L (ref 22–32)
Calcium: 8.5 mg/dL — ABNORMAL LOW (ref 8.9–10.3)
Chloride: 101 mmol/L (ref 101–111)
Creatinine, Ser: 0.67 mg/dL (ref 0.44–1.00)
GFR calc Af Amer: >60 mL/min (ref >60)
GFR calc non Af Amer: >60 mL/min (ref >60)
Glucose, Bld: 94 mg/dL (ref 65–99)
Potassium: 4.1 mmol/L (ref 3.5–5.1)
Sodium: 136 mmol/L (ref 135–145)

## 2014-05-20 LAB — CBC WITH DIFFERENTIAL/PLATELET
BASOS ABS: 0.1 10*3/uL (ref 0–0.1)
Basophils Relative: 1 %
Eosinophils Absolute: 0.8 10*3/uL — ABNORMAL HIGH (ref 0–0.7)
Eosinophils Relative: 6 %
HCT: 39.4 % (ref 35.0–47.0)
Hemoglobin: 12.8 g/dL (ref 12.0–16.0)
Lymphocytes Relative: 21 %
Lymphs Abs: 2.9 10*3/uL (ref 1.0–3.6)
MCH: 29.8 pg (ref 26.0–34.0)
MCHC: 32.6 g/dL (ref 32.0–36.0)
MCV: 91.6 fL (ref 80.0–100.0)
MONO ABS: 0.9 10*3/uL (ref 0.2–0.9)
Monocytes Relative: 7 %
NEUTROS ABS: 9.1 10*3/uL — AB (ref 1.4–6.5)
NEUTROS PCT: 65 %
PLATELETS: 258 10*3/uL (ref 150–440)
RBC: 4.31 MIL/uL (ref 3.80–5.20)
RDW: 14.7 % — ABNORMAL HIGH (ref 11.5–14.5)
WBC: 13.7 10*3/uL — ABNORMAL HIGH (ref 3.6–11.0)

## 2014-05-20 LAB — HIV ANTIBODY (ROUTINE TESTING W REFLEX): HIV Screen 4th Generation wRfx: NONREACTIVE

## 2014-05-20 SURGERY — INSERTION, TUNNELED CENTRAL VENOUS DEVICE, WITH PORT
Anesthesia: Monitor Anesthesia Care

## 2014-05-20 MED ORDER — FENTANYL CITRATE (PF) 100 MCG/2ML IJ SOLN
INTRAMUSCULAR | Status: DC | PRN
  Administered 2014-05-20 (×2): 50 ug via INTRAVENOUS

## 2014-05-20 MED ORDER — SODIUM CHLORIDE 0.9 % IJ SOLN
INTRAMUSCULAR | Status: AC
  Filled 2014-05-20: qty 50

## 2014-05-20 MED ORDER — BUPIVACAINE-EPINEPHRINE (PF) 0.25% -1:200000 IJ SOLN
INTRAMUSCULAR | Status: DC | PRN
  Administered 2014-05-20: 10 mL via PERINEURAL

## 2014-05-20 MED ORDER — PROPOFOL 10 MG/ML IV BOLUS
INTRAVENOUS | Status: DC | PRN
  Administered 2014-05-20: 50 mg via INTRAVENOUS

## 2014-05-20 MED ORDER — ONDANSETRON HCL 4 MG/2ML IJ SOLN
4.0000 mg | Freq: Once | INTRAMUSCULAR | Status: DC | PRN
Start: 2014-05-20 — End: 2014-05-20

## 2014-05-20 MED ORDER — LACTATED RINGERS IV SOLN
INTRAVENOUS | Status: DC | PRN
  Administered 2014-05-20: 10:00:00 via INTRAVENOUS

## 2014-05-20 MED ORDER — BUPIVACAINE-EPINEPHRINE (PF) 0.25% -1:200000 IJ SOLN
INTRAMUSCULAR | Status: AC
  Filled 2014-05-20: qty 30

## 2014-05-20 MED ORDER — PROPOFOL INFUSION 10 MG/ML OPTIME
INTRAVENOUS | Status: DC | PRN
  Administered 2014-05-20: 50 ug/kg/min via INTRAVENOUS

## 2014-05-20 MED ORDER — MIDAZOLAM HCL 2 MG/2ML IJ SOLN
INTRAMUSCULAR | Status: DC | PRN
  Administered 2014-05-20: 2 mg via INTRAVENOUS

## 2014-05-20 MED ORDER — HEPARIN SODIUM (PORCINE) 5000 UNIT/ML IJ SOLN
INTRAMUSCULAR | Status: AC
  Filled 2014-05-20: qty 1

## 2014-05-20 SURGICAL SUPPLY — 22 items
ADHESIVE MASTISOL STRL (MISCELLANEOUS) ×3 IMPLANT
CANISTER SUCT 1200ML W/VALVE (MISCELLANEOUS) ×3 IMPLANT
CHLORAPREP W/TINT 26ML (MISCELLANEOUS) ×3 IMPLANT
CLOSURE WOUND 1/2 X4 (GAUZE/BANDAGES/DRESSINGS) ×1
COVER LIGHT HANDLE STERIS (MISCELLANEOUS) ×6 IMPLANT
GAUZE SPONGE 4X4 12PLY STRL (GAUZE/BANDAGES/DRESSINGS) ×3 IMPLANT
GLOVE BIO SURGEON STRL SZ8 (GLOVE) ×6 IMPLANT
GOWN STRL REUS W/ TWL LRG LVL3 (GOWN DISPOSABLE) ×2 IMPLANT
GOWN STRL REUS W/TWL LRG LVL3 (GOWN DISPOSABLE) ×4
KIT RM TURNOVER STRD PROC AR (KITS) ×3 IMPLANT
LABEL OR SOLS (LABEL) ×3 IMPLANT
NEEDLE FILTER BLUNT 18X 1/2SAF (NEEDLE) ×2
NEEDLE FILTER BLUNT 18X1 1/2 (NEEDLE) ×1 IMPLANT
PACK PORT-A-CATH (MISCELLANEOUS) ×3 IMPLANT
PAD GROUND ADULT SPLIT (MISCELLANEOUS) ×3 IMPLANT
PORTACATH POWER 8F (Port) ×3 IMPLANT
STRIP CLOSURE SKIN 1/2X4 (GAUZE/BANDAGES/DRESSINGS) ×2 IMPLANT
SUT MNCRL AB 4-0 PS2 18 (SUTURE) ×3 IMPLANT
SUT PROLENE 3 0 SH DA (SUTURE) ×3 IMPLANT
SUT VIC AB 3-0 SH 27 (SUTURE) ×2
SUT VIC AB 3-0 SH 27X BRD (SUTURE) ×1 IMPLANT
SYR 3ML LL SCALE MARK (SYRINGE) ×3 IMPLANT

## 2014-05-20 NOTE — Progress Notes (Signed)
Preoperative Review   Patient is met in the preoperative holding area. The history is reviewed in the chart and with the patient. I personally reviewed the options and rationale as well as the risks of this procedure that have been previously discussed with the patient. All questions asked by the patient and/or family were answered to their satisfaction.  Patient agrees to proceed with this procedure at this time.  Tibor Lemmons E Pryor Guettler M.D. FACS  

## 2014-05-20 NOTE — Anesthesia Postprocedure Evaluation (Signed)
  Anesthesia Post-op Note  Patient: [name]  Procedure(s) Performed: Procedure(s): INSERTION PORT-A-CATH (N/A)  Anesthesia type:MAC  Patient location: PACU  Post pain: Pain level controlled  Post assessment: Post-op Vital signs reviewed, Patient's Cardiovascular Status Stable, Respiratory Function Stable, Patent Airway and No signs of Nausea or vomiting  Post vital signs: Reviewed and stable  Last Vitals:  Filed Vitals:   05/20/14 1025  BP: 109/70  Pulse: 85  Temp: 37.4 C  Resp:     Level of consciousness: awake, alert  and patient cooperative  Complications: No apparent anesthesia complications

## 2014-05-20 NOTE — Op Note (Signed)
  Pre-operative Diagnosis:  Post-operative Diagnosis:   Surgeon: Florene Glen      Surgeon: Jerrol Banana. Burt Knack, MD FACS  Anesthesia: MAC  Procedure: Port placement with fluoroscopy  Procedure Details  The patient was seen again in the Holding Room. The benefits, complications, treatment options, and expected outcomes were discussed with the patient. The risks of bleeding, infection, recurrence of symptoms, failure to resolve symptoms,  thrombosis nonfunction breakage pneumothorax hemopneumothorax any of which could require chest tube or further surgery were reviewed with the patient.   The patient was taken to Operating Room, identified as [name] and the procedure verified.  A Time Out was held and the above information confirmed.  Prior to the induction of general anesthesia, antibiotic prophylaxis was administered. VTE prophylaxis was in place. Appropriate anesthesia was then administered and tolerated well. The chest was prepped with Chloraprep and draped in the sterile fashion. The patient was positioned in the supine position. Then the patient was placed in Trendelenburg position.  Patient was prepped and draped in sterile fashion and in a Trendelenburg position local anesthetic was infiltrated into the skin and subcutaneous tissues in the anterior chest wall. The large bore needle was placed into the subclavian vein without difficulty and then the Seldinger wire was advanced. Fluoroscopy was utilized to confirm that the Seldinger wire was in the superior vena cava.  An incision was made and a port pocket developed with blunt and electrocautery dissection. The introducer dilator was placed over the Seldinger wire the wire was removed. The previously flushed catheter was placed into the introducer dilator and the peel-away sheath was removed. The catheter length was confirmed and trimmed utilizing fluoroscopy for proper positioning. The catheter was then attached to the  previously flushed port. The port was placed into the pocket. The port was held in with 3-0 Prolenes and flushed for function and heparin locked.  The wound was closed with interrupted 3-0 Vicryl followed by 4-0 subcuticular Monocryl sutures. Steri-Strips Mastisol and sterile dressings were placed  Patient was taken to the recovery room in stable condition where a postoperative chest film has been ordered.  Findings: Port placed in the subclavian position verified by fluoroscopy. A postoperative chest film has been ordered.  Estimated Blood Loss: Minimal         Drains: None         Specimens: None       Complications: None                  Condition: Stable   Stepfanie Yott E. Burt Knack, MD, FACS

## 2014-05-20 NOTE — Anesthesia Procedure Notes (Signed)
Procedure Name: MAC Date/Time: 05/20/2014 9:42 AM Performed by: Doreen Salvage Pre-anesthesia Checklist: Patient identified, Emergency Drugs available, Suction available and Patient being monitored Patient Re-evaluated:Patient Re-evaluated prior to inductionOxygen Delivery Method: Simple face mask

## 2014-05-20 NOTE — Anesthesia Preprocedure Evaluation (Addendum)
Anesthesia Evaluation  Patient identified by MRN, date of birth, ID band Patient awake    Reviewed: NPO status   Airway Mallampati: II       Dental  (+) Edentulous Upper, Missing   Pulmonary shortness of breath, asthma , COPD COPD inhaler, Current Smoker,  + rhonchi   + decreased breath sounds      Cardiovascular Exercise Tolerance: Poor hypertension, + DOE Normal cardiovascular exam    Neuro/Psych PSYCHIATRIC DISORDERS Anxiety Depression Bipolar Disorder Schizophrenia  Neuromuscular disease    GI/Hepatic Neg liver ROS, PUD, GERD-  Medicated and Controlled,(+)     substance abuse  alcohol use,   Endo/Other  Hypothyroidism   Renal/GU   negative genitourinary   Musculoskeletal  (+) Arthritis -, Osteoarthritis,  Fibromyalgia -  Abdominal (+) + obese,   Peds negative pediatric ROS (+)  Hematology  (+) anemia ,   Anesthesia Other Findings   Reproductive/Obstetrics                            Anesthesia Physical Anesthesia Plan  ASA: III  Anesthesia Plan: MAC   Post-op Pain Management:    Induction: Intravenous  Airway Management Planned: Nasal Cannula  Additional Equipment:   Intra-op Plan:   Post-operative Plan:   Informed Consent: I have reviewed the patients History and Physical, chart, labs and discussed the procedure including the risks, benefits and alternatives for the proposed anesthesia with the patient or authorized representative who has indicated his/her understanding and acceptance.     Plan Discussed with: CRNA and Surgeon  Anesthesia Plan Comments:         Anesthesia Quick Evaluation

## 2014-05-20 NOTE — OR Nursing (Signed)
Patient  contiue with the SCD both legs

## 2014-05-20 NOTE — Progress Notes (Addendum)
[name]    MR#:  [id number]  DATE OF BIRTH:  02/28/1955  SUBJECTIVE:  Seen in PACU post prot a cath placement  REVIEW OF SYSTEMS:    Review of Systems  Constitutional: Negative for fever, chills and weight loss.  HENT: Negative for ear discharge, ear pain and nosebleeds.   Eyes: Negative for blurred vision, pain and discharge.  Respiratory: Negative for cough, sputum production, shortness of breath, wheezing and stridor.   Cardiovascular: Negative for chest pain, palpitations, orthopnea and PND.  Gastrointestinal: Negative for nausea, vomiting, abdominal pain and diarrhea.  Genitourinary: Negative for urgency and frequency.  Musculoskeletal: Negative for back pain and joint pain.  Neurological: Negative for sensory change, speech change, focal weakness and weakness.  Psychiatric/Behavioral: Negative for depression, hallucinations and memory loss. The patient is not nervous/anxious.   All other systems reviewed and are negative.   DRUG ALLERGIES:   Allergies  Allergen Reactions  . Sulfa Antibiotics Rash    VITALS:  Blood pressure 121/71, pulse 82, temperature 97.5 F (36.4 C), temperature source Oral, resp. rate 18, height '5\' 8"'$  (1.727 m), weight 83.689 kg (184 lb 8 oz), SpO2 96 %.  PHYSICAL EXAMINATION:   Physical Exam  GENERAL:  59 y.o.-year-old patient lying in the bed with no acute distress.  EYES: Pupils equal, round, reactive to light and accommodation. No scleral icterus. Extraocular muscles intact.  HEENT: Head atraumatic, normocephalic. Oropharynx and nasopharynx clear.  NECK:  Supple, no jugular venous distention. No thyroid enlargement, no tenderness.  LUNGS: Normal breath sounds bilaterally, no wheezing, rales, rhonchi. No use of accessory muscles of respiration.  -port a cath placement CARDIOVASCULAR: S1, S2 normal. No murmurs, rubs, or gallops.  ABDOMEN: Soft, nontender,  nondistended. Bowel sounds present. No organomegaly or mass.  EXTREMITIES: No cyanosis, clubbing or edema b/l.    NEUROLOGIC: Cranial nerves II through XII are intact. No focal Motor or sensory deficits b/l.   PSYCHIATRIC: The patient is alert and oriented x 3.  SKIN: No obvious rash, lesion, or ulcer.    LABORATORY PANEL:   CBC  Recent Labs Lab 05/20/14 0550  WBC 13.7*  HGB 12.8  HCT 39.4  PLT 258   ------------------------------------------------------------------------------------------------------------------  Chemistries   Recent Labs Lab 05/20/14 0550  NA 136  K 4.1  CL 101  CO2 30  GLUCOSE 94  BUN 6  CREATININE 0.67  CALCIUM 8.5*   ------------------------------------------------------------------------------------------------------------------  Cardiac Enzymes No results for input(s): TROPONINI in the last 168 hours. ------------------------------------------------------------------------------------------------------------------  RADIOLOGY:  Ct Chest W Contrast  05/19/2014   CLINICAL DATA:  Anal cancer.  Staging  EXAM: CT CHEST, ABDOMEN, AND PELVIS WITH CONTRAST  TECHNIQUE: Multidetector CT imaging of the chest, abdomen and pelvis was performed following the standard protocol during bolus administration of intravenous contrast.  CONTRAST:  182m OMNIPAQUE IOHEXOL 300 MG/ML  SOLN  COMPARISON:  None.  FINDINGS: CT CHEST FINDINGS  Mediastinum: The heart size is normal. There is no pericardial effusion identified. Aortic atherosclerosis noted. The trachea appears patent and is midline. Normal appearance of the esophagus. No enlarged mediastinal or hilar lymph nodes.  Lungs/Pleura: No pleural effusion. Subsegmental atelectasis is noted in the right lung base, image 39 of series 5. Mild changes of centrilobular emphysema. Small right upper lobe nodule measures 4 mm, image 22/series 5.  Musculoskeletal: There is no aggressive lytic or sclerotic bone lesions. Chronic  appearing compression fractures involve T6, T9, T10 and  T12.  CT ABDOMEN AND PELVIS FINDINGS  Hepatobiliary: No focal liver abnormality. The gallbladder appears normal. No biliary dilatation.  Pancreas: Negative  Spleen: Negative  Adrenals/Urinary Tract: The adrenal glands are negative normal appearance of the left kidney. 8 mm low attenuation structure in the right iliac bone is noted and is too small to characterize.  Stomach/Bowel: The stomach is within normal limits. The small bowel loops have a normal course and caliber. No obstruction. Lower rectal/ anal mass is identified. This measures approximately 4.7 x 4.1 x 6.0 cm.  Vascular/Lymphatic: Calcified atherosclerotic disease involves the abdominal aorta. The abdominal aorta measures 3.7 cm in AP dimension, image 73/series 2. No upper abdominal adenopathy. There is a left common iliac lymph node measuring 8 mm, image 90/series 2. Left external iliac node measures 7 mm, image 100/series 2. Right inguinal node measures 1.3 cm, image 51/series 7.  Reproductive: The uterus appears normal. Bilateral tubal ligation. Adnexal structures are unremarkable.  Other: There is no ascites or focal fluid collections within the abdomen or pelvis.  Musculoskeletal: Review of the visualized bony structures is negative for aggressive lytic or sclerotic bone lesion.  IMPRESSION: 1. No specific features identified to suggest metastatic disease to the chest. Small pulmonary nodule in the right upper lobe measures 4 mm an warrants attention on followup imaging. 2. No evidence for upper abdominal metastasis. 3. Borderline enlarged iliac lymph nodes are identified. Within the right inguinal region there is an enlarged node which measures 13 mm. Cannot rule out metastatic adenopathy. 4. Large lower rectal and anal mass compatible with the clinical history of anal cancer. 5. Aortic atherosclerosis 6. Emphysema 7. Multi level thoracic compression deformities.   Electronically Signed   By:  Kerby Moors M.D.   On: 05/19/2014 09:30   Ct Abdomen Pelvis W Contrast  05/19/2014   CLINICAL DATA:  Anal cancer.  Staging  EXAM: CT CHEST, ABDOMEN, AND PELVIS WITH CONTRAST  TECHNIQUE: Multidetector CT imaging of the chest, abdomen and pelvis was performed following the standard protocol during bolus administration of intravenous contrast.  CONTRAST:  120m OMNIPAQUE IOHEXOL 300 MG/ML  SOLN  COMPARISON:  None.  FINDINGS: CT CHEST FINDINGS  Mediastinum: The heart size is normal. There is no pericardial effusion identified. Aortic atherosclerosis noted. The trachea appears patent and is midline. Normal appearance of the esophagus. No enlarged mediastinal or hilar lymph nodes.  Lungs/Pleura: No pleural effusion. Subsegmental atelectasis is noted in the right lung base, image 39 of series 5. Mild changes of centrilobular emphysema. Small right upper lobe nodule measures 4 mm, image 22/series 5.  Musculoskeletal: There is no aggressive lytic or sclerotic bone lesions. Chronic appearing compression fractures involve T6, T9, T10 and T12.  CT ABDOMEN AND PELVIS FINDINGS  Hepatobiliary: No focal liver abnormality. The gallbladder appears normal. No biliary dilatation.  Pancreas: Negative  Spleen: Negative  Adrenals/Urinary Tract: The adrenal glands are negative normal appearance of the left kidney. 8 mm low attenuation structure in the right iliac bone is noted and is too small to characterize.  Stomach/Bowel: The stomach is within normal limits. The small bowel loops have a normal course and caliber. No obstruction. Lower rectal/ anal mass is identified. This measures approximately 4.7 x 4.1 x 6.0 cm.  Vascular/Lymphatic: Calcified atherosclerotic disease involves the abdominal aorta. The abdominal aorta measures 3.7 cm in AP dimension, image 73/series 2. No upper abdominal adenopathy. There is a left common iliac lymph node measuring 8 mm, image 90/series 2. Left external iliac node measures  7 mm, image 100/series  2. Right inguinal node measures 1.3 cm, image 51/series 7.  Reproductive: The uterus appears normal. Bilateral tubal ligation. Adnexal structures are unremarkable.  Other: There is no ascites or focal fluid collections within the abdomen or pelvis.  Musculoskeletal: Review of the visualized bony structures is negative for aggressive lytic or sclerotic bone lesion.  IMPRESSION: 1. No specific features identified to suggest metastatic disease to the chest. Small pulmonary nodule in the right upper lobe measures 4 mm an warrants attention on followup imaging. 2. No evidence for upper abdominal metastasis. 3. Borderline enlarged iliac lymph nodes are identified. Within the right inguinal region there is an enlarged node which measures 13 mm. Cannot rule out metastatic adenopathy. 4. Large lower rectal and anal mass compatible with the clinical history of anal cancer. 5. Aortic atherosclerosis 6. Emphysema 7. Multi level thoracic compression deformities.   Electronically Signed   By: Kerby Moors M.D.   On: 05/19/2014 09:30   Dg Chest Port 1 View  05/20/2014   CLINICAL DATA:  Postop Port-A-Cath  EXAM: PORTABLE CHEST - 1 VIEW  COMPARISON:  Yesterday  FINDINGS: Left subclavian vein Port-A-Cath placed. Tip is in the lower SVC. No pneumothorax. Lungs under aerated and clear. Normal heart size.  IMPRESSION: Left subclavian vein Port-A-Cath place with its tip in the lower SVC and no pneumothorax.   Electronically Signed   By: Marybelle Killings M.D.   On: 05/20/2014 11:10   Dg C-arm 1-60 Min-no Report  05/20/2014   CLINICAL DATA: port a cath insertion   C-ARM 1-60 MINUTES  Fluoroscopy was utilized by the requesting physician.  No radiographic  interpretation.      ASSESSMENT AND PLAN:   58 yo female w/ hx of schizophrenia, COPD, Bipolaris disorder, Depression, Fibromyalgia, PTSD, hx of DVT, Hypothyroidism, HTN came into hospital due to rectal pain, drainage and diarrhea and pain in the area.   1. Rectal Ulcer/Cancer  - likely cause of pt's drainage and pain.  - cont. Supportive care w/ pain meds w/ Morphine, Norco PRN for pain. Pain has improved.  - seen by Oncology and discussed w/ Dr. Mike Gip and case was discussed at Tumor board yesterday. Pt. Likely need diverting colostomy w/ adjuvant chemo/radiation  -s/p port placed on 05/20/14 by Dr cooper  -Diverting colostomy on tues or wed of next week - had CT abd/pelvis/chest for staging and noted to have iliac nodes but no other evidence of metastatic disease.  - cont. Supportive care for now.   2. COPD - no acute exacerbation.  - cont. Symbicort, Spiriva.   3. Depression - cont. Lexapro, Cymbalta.   4. Hypokalemia - improved w/ supplementation.   5. Anxiety - cont. Klonopin.   6. Hyperlipidemia - cont. Simvastatin.      All the records are reviewed and case discussed with Care Management/Social Workerr. Management plans discussed with the patient, family and they are in agreement.  CODE STATUS:full  DVT Prophylaxis: Heparin SQ  TOTAL TIME TAKING CARE OF THIS PATIENT: 25 minutes.   POSSIBLE  D/C IPENDING ON CLINICAL CONDITION.  Lucas Winograd M.D on 05/20/2014 at 3:23 PM  Between 7am to 6pm - Pager - [phone]  After 6pm go to [email or web address] - password [email or web address] [facility]  Office  [phone]  CC: Primary care physician; Lavera Guise, MD

## 2014-05-20 NOTE — Progress Notes (Signed)
Pt refuses bed alarm. 

## 2014-05-20 NOTE — Progress Notes (Signed)
Telephone conversation with Dr. Grayland Ormond. Dr. Grayland Ormond called me to discuss the care of this patient, I had not heard back from Dr. Mike Gip yesterday. We discussed the plans for port and colostomy and timing of each. Presently the patient is in PACU following port placement with fluoroscopy.  I discussed the rationale for place and the port first so that she could have chemotherapy on Monday and Dr. Rexene Edison could place the colostomy on Tuesday or Wednesday. Dr. Grayland Ormond was in agreement and understanding of this plan and will relay the information to Dr. Mike Gip and I will do the same with Dr. Rexene Edison.

## 2014-05-20 NOTE — Transfer of Care (Signed)
  Immediate Anesthesia Transfer of Care Note  Patient: [name]  Procedure(s) Performed: Procedure(s): INSERTION PORT-A-CATH (N/A)  Patient Location: PACU  Anesthesia Type:General  Level of Consciousness: sedated  Airway & Oxygen Therapy: Patient Spontanous Breathing and Patient connected to face mask oxygen  Post-op Assessment: Report given to RN and Post -op Vital signs reviewed and stable  Post vital signs: Reviewed and stable  Last Vitals:  Filed Vitals:   05/20/14 1025  BP: 109/70  Pulse: 85  Temp: 37.4 C  Resp:     Complications: No apparent anesthesia complications

## 2014-05-21 ENCOUNTER — Encounter: Payer: Self-pay | Admitting: Surgery

## 2014-05-21 NOTE — Progress Notes (Signed)
[facility] at [place] NAME: [name]    MR#:  [id number]  DATE OF BIRTH:  1955/10/10  SUBJECTIVE:  Feels a lot better.  REVIEW OF SYSTEMS:    Review of Systems  Constitutional: Negative for fever, chills and weight loss.  HENT: Negative for ear discharge, ear pain and nosebleeds.   Eyes: Negative for blurred vision, pain and discharge.  Respiratory: Negative for sputum production, shortness of breath, wheezing and stridor.   Cardiovascular: Negative for chest pain, palpitations, orthopnea and PND.  Gastrointestinal: Negative for nausea, vomiting, abdominal pain, diarrhea and blood in stool.  Genitourinary: Negative for urgency and frequency.  Musculoskeletal: Positive for myalgias, back pain and joint pain.  Neurological: Negative for sensory change, speech change, focal weakness and weakness.  Psychiatric/Behavioral: Negative for depression and hallucinations. The patient is not nervous/anxious.     DRUG ALLERGIES:   Allergies  Allergen Reactions  . Sulfa Antibiotics Rash    VITALS:  Blood pressure 106/60, pulse 72, temperature 98.2 F (36.8 C), temperature source Oral, resp. rate 19, height '5\' 8"'$  (1.727 m), weight 83.689 kg (184 lb 8 oz), SpO2 98 %.  PHYSICAL EXAMINATION:   Physical Exam  GENERAL:  59 y.o.-year-old patient lying in the bed with no acute distress.  EYES: Pupils equal, round, reactive to light and accommodation. No scleral icterus. Extraocular muscles intact.  HEENT: Head atraumatic, normocephalic. Oropharynx and nasopharynx clear.  NECK:  Supple, no jugular venous distention. No thyroid enlargement, no tenderness.  LUNGS: Normal breath sounds bilaterally, no wheezing, rales, rhonchi. No use of accessory muscles of respiration.  -port a cath placement CARDIOVASCULAR: S1, S2 normal. No murmurs, rubs, or gallops.  ABDOMEN: Soft, nontender, nondistended. Bowel sounds present. No organomegaly or mass.   EXTREMITIES: No cyanosis, clubbing or edema b/l.    NEUROLOGIC: Cranial nerves II through XII are intact. No focal Motor or sensory deficits b/l.   PSYCHIATRIC: The patient is alert and oriented x 3.  SKIN: rectal  Ulcer+.    LABORATORY PANEL:   CBC  Recent Labs Lab 05/20/14 0550  WBC 13.7*  HGB 12.8  HCT 39.4  PLT 258   ------------------------------------------------------------------------------------------------------------------  Chemistries   Recent Labs Lab 05/20/14 0550  NA 136  K 4.1  CL 101  CO2 30  GLUCOSE 94  BUN 6  CREATININE 0.67  CALCIUM 8.5*   ------------------------------------------------------------------------------------------------------------------  Cardiac Enzymes No results for input(s): TROPONINI in the last 168 hours. ------------------------------------------------------------------------------------------------------------------  RADIOLOGY:  Dg Chest Port 1 View  05/20/2014   CLINICAL DATA:  Postop Port-A-Cath  EXAM: PORTABLE CHEST - 1 VIEW  COMPARISON:  Yesterday  FINDINGS: Left subclavian vein Port-A-Cath placed. Tip is in the lower SVC. No pneumothorax. Lungs under aerated and clear. Normal heart size.  IMPRESSION: Left subclavian vein Port-A-Cath place with its tip in the lower SVC and no pneumothorax.   Electronically Signed   By: [name] M.D.   On: 05/20/2014 11:10   Dg C-arm 1-60 Min-no Report  05/20/2014   CLINICAL DATA: port a cath insertion   C-ARM 1-60 MINUTES  Fluoroscopy was utilized by the requesting physician.  No radiographic  interpretation.      ASSESSMENT AND PLAN:   59 yo female w/ hx of schizophrenia, COPD, Bipolaris disorder, Depression, Fibromyalgia, PTSD, hx of DVT, Hypothyroidism, HTN came into hospital due to rectal pain, drainage and diarrhea and pain in the area.   1. Rectal Ulcer/Cancer - likely cause of pt's drainage  and pain.  - cont. Supportive care w/ pain meds w/ Morphine, Norco PRN for  pain. Pain has improved.  - seen by Oncology and discussed w/ Dr. Mike Gip and case was discussed at Tumor board yesterday. Pt. Likely need diverting colostomy w/ adjuvant chemo/radiation  -s/p port placed on 05/20/14 by Dr cooper  -Diverting colostomy on tues or wed of next week - had CT abd/pelvis/chest for staging and noted to have iliac nodes but no other evidence of metastatic disease.  - cont. Supportive care for now.   2. COPD - no acute exacerbation.  - cont. Symbicort, Spiriva.   3. Depression - cont. Lexapro, Cymbalta.   4. Hypokalemia - improved w/ supplementation.   5. Anxiety - cont. Klonopin.   6. Hyperlipidemia - cont. Simvastatin.      All the records are reviewed and case discussed with Care Management/Social Workerr. Management plans discussed with the patient, family and they are in agreement.  CODE STATUS:full  DVT Prophylaxis: Heparin SQ  TOTAL TIME TAKING CARE OF THIS PATIENT: 25 minutes.   POSSIBLE  D/C IPENDING ON CLINICAL CONDITION.  [name] M.D on 05/21/2014 at 1:50 PM  Between 7am to 6pm - Pager - [phone]  After 6pm go to [email or web address] - password [email or web address] [facility]  Office  [phone]  CC: Primary care physician; Lavera Guise, MD

## 2014-05-21 NOTE — Progress Notes (Signed)
Postoperative day 1 following port placement with fluoroscopy. She has minimal complaints of pain otherwise doing well.  Wound is dry dressing is removed and inspected and no drainage no erythema.  Assessment and plan patient doing very well. Will discuss with Dr. Rexene Edison timing of colostomy and with Dr. Mike Gip tomorrow concerning possible chemotherapy prior to discharge.

## 2014-05-22 NOTE — Progress Notes (Signed)
Subjective:   She has done well following her port placement but continues to have rectal pain and drainage.  Vital signs in last 24 hours: Temp:  [98.2 F (36.8 C)-98.4 F (36.9 C)] 98.2 F (36.8 C) (05/16 0758) Pulse Rate:  [69] 69 (05/16 0758) Resp:  [17-19] 17 (05/16 0758) BP: (130-143)/(72-85) 143/72 mmHg (05/16 0758) SpO2:  [97 %] 97 % (05/16 0758) Last BM Date: 05/18/14  Intake/Output from previous day: 05/15 0701 - 05/16 0700 In: 720 [P.O.:720] Out: -   Exam:  Abdomen is unremarkable. Her chest is clear. She has no particular complaints at the present time other than her rectal pain.  Lab Results:  CBC  Recent Labs  05/20/14 0550  WBC 13.7*  HGB 12.8  HCT 39.4  PLT 258   CMP     Component Value Date/Time   NA 136 05/20/2014 0550   NA 142 12/25/2013 1858   K 4.1 05/20/2014 0550   K 3.5 12/25/2013 1858   CL 101 05/20/2014 0550   CL 107 12/25/2013 1858   CO2 30 05/20/2014 0550   CO2 29 12/25/2013 1858   GLUCOSE 94 05/20/2014 0550   GLUCOSE 88 12/25/2013 1858   BUN 6 05/20/2014 0550   BUN 11 12/25/2013 1858   CREATININE 0.67 05/20/2014 0550   CREATININE 0.80 12/25/2013 1858   CALCIUM 8.5* 05/20/2014 0550   CALCIUM 8.1* 12/25/2013 1858   PROT 6.5 12/25/2013 1858   ALBUMIN 2.2* 12/25/2013 1858   AST 28 12/25/2013 1858   ALT 11* 12/25/2013 1858   ALKPHOS 98 12/25/2013 1858   GFRNONAA >60 05/20/2014 0550   GFRNONAA >60 09/15/2013 1430   GFRAA >60 05/20/2014 0550   GFRAA >60 09/15/2013 1430   PT/INR No results for input(s): LABPROT, INR in the last 72 hours.  Studies/Results: Dg Chest Port 1 View  05/20/2014   CLINICAL DATA:  Postop Port-A-Cath  EXAM: PORTABLE CHEST - 1 VIEW  COMPARISON:  Yesterday  FINDINGS: Left subclavian vein Port-A-Cath placed. Tip is in the lower SVC. No pneumothorax. Lungs under aerated and clear. Normal heart size.  IMPRESSION: Left subclavian vein Port-A-Cath place with its tip in the lower SVC and no pneumothorax.    Electronically Signed   By: Marybelle Killings M.D.   On: 05/20/2014 11:10   Dg C-arm 1-60 Min-no Report  05/20/2014   CLINICAL DATA: port a cath insertion   C-ARM 1-60 MINUTES  Fluoroscopy was utilized by the requesting physician.  No radiographic  interpretation.     Assessment/Plan: At this point it appears that fecal diversion is the next option. I will discussed the planned procedure with Dr. Rexene Edison who is her primary surgeon in this case. I have cautioned her that diversion will not salve all of her rectal pain problems or her discomfort issue. She will continue to have pain until she is under therapy for her tumor. She is in agreement with this plan.

## 2014-05-22 NOTE — Progress Notes (Signed)
[place] [facility] [place] NAME: [name]    MR#:  [id number]  DATE OF BIRTH:  06-10-1955  SUBJECTIVE:  Feels a lot better. Very positive outlook   REVIEW OF SYSTEMS:    Review of Systems  Constitutional: Negative for fever, chills and weight loss.  HENT: Negative for ear discharge, ear pain and nosebleeds.   Eyes: Negative for blurred vision, pain and discharge.  Respiratory: Negative for sputum production, shortness of breath, wheezing and stridor.   Cardiovascular: Negative for chest pain, palpitations, orthopnea and PND.  Gastrointestinal: Negative for nausea, vomiting, abdominal pain and diarrhea.  Genitourinary: Negative for urgency and frequency.  Musculoskeletal: Negative for back pain and joint pain.  Neurological: Negative for sensory change, speech change, focal weakness and weakness.  Psychiatric/Behavioral: Negative for depression and hallucinations. The patient is not nervous/anxious.     DRUG ALLERGIES:   Allergies  Allergen Reactions  . Sulfa Antibiotics Rash    VITALS:  Blood pressure 143/72, pulse 69, temperature 98.2 F (36.8 C), temperature source Oral, resp. rate 17, height '5\' 8"'$  (1.727 m), weight 83.689 kg (184 lb 8 oz), SpO2 97 %.  PHYSICAL EXAMINATION:   Physical Exam  GENERAL:  59 y.o.-year-old patient lying in the bed with no acute distress.  EYES: Pupils equal, round, reactive to light and accommodation. No scleral icterus. Extraocular muscles intact.  HEENT: Head atraumatic, normocephalic. Oropharynx and nasopharynx clear.  NECK:  Supple, no jugular venous distention. No thyroid enlargement, no tenderness.  LUNGS: Normal breath sounds bilaterally, no wheezing, rales, rhonchi. No use of accessory muscles of respiration.  -port a cath placement CARDIOVASCULAR: S1, S2 normal. No murmurs, rubs, or gallops.  ABDOMEN: Soft, nontender, nondistended. Bowel sounds present. No organomegaly or mass.   EXTREMITIES: No cyanosis, clubbing or edema b/l.    NEUROLOGIC: Cranial nerves II through XII are intact. No focal Motor or sensory deficits b/l.   PSYCHIATRIC: The patient is alert and oriented x 3.  SKIN: rectal  Ulcer+.    LABORATORY PANEL:   CBC  Recent Labs Lab 05/20/14 0550  WBC 13.7*  HGB 12.8  HCT 39.4  PLT 258   ------------------------------------------------------------------------------------------------------------------  Chemistries   Recent Labs Lab 05/20/14 0550  NA 136  K 4.1  CL 101  CO2 30  GLUCOSE 94  BUN 6  CREATININE 0.67  CALCIUM 8.5*   ------------------------------------------------------------------------------------------------------------------  Cardiac Enzymes No results for input(s): TROPONINI in the last 168 hours. ------------------------------------------------------------------------------------------------------------------  RADIOLOGY:  No results found.   ASSESSMENT AND PLAN:   59 yo female w/ hx of schizophrenia, COPD, Bipolaris disorder, Depression, Fibromyalgia, PTSD, hx of DVT, Hypothyroidism, HTN came into hospital due to rectal pain, drainage and diarrhea and pain in the area.   1. Rectal Ulcer/Cancer - likely cause of pt's drainage and pain.  - cont. Supportive care w/ pain meds w/ Morphine, Norco PRN for pain. Pain has improved.  - seen by Oncology and discussed w/ Dr. Mike Gip and case was discussed at Tumor board yesterday. Pt. to get diverting colostomy w/ adjuvant chemo/radiation after surgery -s/p port placed on 05/20/14 by Dr cooper  -Diverting colostomy on  Wed - had CT abd/pelvis/chest for staging and noted to have iliac nodes but no other evidence of metastatic disease.  - cont. Supportive care for now.   2. COPD - no acute exacerbation.  - cont. Symbicort, Spiriva.   3. Depression - cont. Lexapro, Cymbalta.   4. Hypokalemia - improved w/ supplementation.  5. Anxiety - cont. Klonopin.   6.  Hyperlipidemia - cont. Simvastatin.    Will request transfer to surgical service. Medically stable at present  All the records are reviewed and case discussed with Care Management/Social Workerr. Management plans discussed with the patient, family and they are in agreement.  CODE STATUS:full  DVT Prophylaxis: Heparin SQ  TOTAL TIME TAKING CARE OF THIS PATIENT: 25 minutes.   POSSIBLE  D/C IPENDING ON CLINICAL CONDITION.  [name] M.D on 05/22/2014 at 7:06 PM  Between 7am to 6pm - Pager - [phone]  After 6pm go to [email or web address] - password [email or web address] [facility]  Office  [phone]  CC: Primary care physician; Lavera Guise, MD

## 2014-05-23 LAB — CBC
HCT: 40.3 % (ref 35.0–47.0)
Hemoglobin: 13.1 g/dL (ref 12.0–16.0)
MCH: 30.1 pg (ref 26.0–34.0)
MCHC: 32.6 g/dL (ref 32.0–36.0)
MCV: 92.4 fL (ref 80.0–100.0)
PLATELETS: 270 10*3/uL (ref 150–440)
RBC: 4.36 MIL/uL (ref 3.80–5.20)
RDW: 15 % — AB (ref 11.5–14.5)
WBC: 14.1 10*3/uL — ABNORMAL HIGH (ref 3.6–11.0)

## 2014-05-23 LAB — BASIC METABOLIC PANEL
Anion gap: 5 (ref 5–15)
BUN: 13 mg/dL (ref 6–20)
CO2: 27 mmol/L (ref 22–32)
CREATININE: 0.73 mg/dL (ref 0.44–1.00)
Calcium: 8.8 mg/dL — ABNORMAL LOW (ref 8.9–10.3)
Chloride: 105 mmol/L (ref 101–111)
GFR calc Af Amer: >60 mL/min (ref >60)
GFR calc non Af Amer: >60 mL/min (ref >60)
Glucose, Bld: 90 mg/dL (ref 65–99)
Potassium: 4.3 mmol/L (ref 3.5–5.1)
Sodium: 137 mmol/L (ref 135–145)

## 2014-05-23 MED ORDER — SODIUM CHLORIDE 0.9 % IV SOLN
1.0000 g | Freq: Once | INTRAVENOUS | Status: AC
  Filled 2014-05-23: qty 1

## 2014-05-23 MED ORDER — SODIUM CHLORIDE 0.9 % IV SOLN
INTRAVENOUS | Status: DC
  Administered 2014-05-23 – 2014-05-29 (×10): via INTRAVENOUS

## 2014-05-23 NOTE — Progress Notes (Signed)
[name]    MR#:  [id number]  DATE OF BIRTH:  05/05/1955  SUBJECTIVE:  Feels a lot better. Still raw around her anal area  REVIEW OF SYSTEMS:    Review of Systems  Constitutional: Negative for fever, chills and weight loss.  HENT: Negative for ear discharge, ear pain and nosebleeds.   Eyes: Negative for blurred vision, pain and discharge.  Respiratory: Negative for sputum production, shortness of breath, wheezing and stridor.   Cardiovascular: Negative for chest pain, palpitations, orthopnea and PND.  Gastrointestinal: Negative for nausea, vomiting, abdominal pain and diarrhea.  Genitourinary: Negative for urgency and frequency.  Musculoskeletal: Negative for back pain and joint pain.  Neurological: Negative for sensory change, speech change, focal weakness and weakness.  Psychiatric/Behavioral: Negative for depression and hallucinations. The patient is not nervous/anxious.     DRUG ALLERGIES:   Allergies  Allergen Reactions  . Sulfa Antibiotics Rash    VITALS:  Blood pressure 107/54, pulse 70, temperature 98 F (36.7 C), temperature source Oral, resp. rate 18, height '5\' 8"'$  (1.727 m), weight 83.689 kg (184 lb 8 oz), SpO2 95 %.  PHYSICAL EXAMINATION:   Physical Exam  GENERAL:  59 y.o.-year-old patient lying in the bed with no acute distress.  EYES: Pupils equal, round, reactive to light and accommodation. No scleral icterus. Extraocular muscles intact.  HEENT: Head atraumatic, normocephalic. Oropharynx and nasopharynx clear.  NECK:  Supple, no jugular venous distention. No thyroid enlargement, no tenderness.  LUNGS: Normal breath sounds bilaterally, no wheezing, rales, rhonchi. No use of accessory muscles of respiration.  -port a cath placement CARDIOVASCULAR: S1, S2 normal. No murmurs, rubs, or gallops.  ABDOMEN: Soft, nontender, nondistended. Bowel sounds present. No organomegaly or  mass.  EXTREMITIES: No cyanosis, clubbing or edema b/l.    NEUROLOGIC: Cranial nerves II through XII are intact. No focal Motor or sensory deficits b/l.   PSYCHIATRIC: The patient is alert and oriented x 3.  SKIN: rectal  Ulcer+.    LABORATORY PANEL:   CBC  Recent Labs Lab 05/23/14 0536  WBC 14.1*  HGB 13.1  HCT 40.3  PLT 270   ------------------------------------------------------------------------------------------------------------------  Chemistries   Recent Labs Lab 05/23/14 0536  NA 137  K 4.3  CL 105  CO2 27  GLUCOSE 90  BUN 13  CREATININE 0.73  CALCIUM 8.8*   ------------------------------------------------------------------------------------------------------------------  Cardiac Enzymes No results for input(s): TROPONINI in the last 168 hours. ------------------------------------------------------------------------------------------------------------------  RADIOLOGY:  No results found.   ASSESSMENT AND PLAN:   59 yo female w/ hx of schizophrenia, COPD, Bipolaris disorder, Depression, Fibromyalgia, PTSD, hx of DVT, Hypothyroidism, HTN came into hospital due to rectal pain, drainage and diarrhea and pain in the area.   1. Rectal Ulcer/Cancer - likely cause of pt's drainage and pain.  - cont. Supportive care w/ pain meds w/ Morphine, Norco PRN for pain. Pain has improved.  - seen by Oncology and discussed w/ Dr. Mike Gip and case was discussed at Tumor board. - Pt. To get diverting colostomy w/ adjuvant chemo/radiation  -s/p port placed on 05/20/14 by Dr cooper  -Diverting colostomy per Dr Rexene Edison - had CT abd/pelvis/chest for staging and noted to have iliac nodes but no other evidence of metastatic disease.  - cont. Supportive care for now.   2. COPD - no acute exacerbation.  - cont. Symbicort, Spiriva.   3. Depression - cont. Lexapro, Cymbalta.   4. Hypokalemia - improved w/  supplementation.   5. Anxiety - cont. Klonopin.   6.  Hyperlipidemia - cont. Simvastatin.   Transfer to Dr [employer] service Will sign off. Call if needed   All the records are reviewed and case discussed with Care Management/Social Workerr. Management plans discussed with the patient, family and they are in agreement.  CODE STATUS:full  DVT Prophylaxis: Heparin SQ  TOTAL TIME TAKING CARE OF THIS PATIENT: 25 minutes.   POSSIBLE  D/C IPENDING ON CLINICAL CONDITION.  Linlee Cromie M.D on 05/23/2014 at 7:31 AM  Between 7am to 6pm - Pager - [phone]  After 6pm go to [email or web address] - password [email or web address] [facility]  Office  [phone]  CC: Primary care physician; Lavera Guise, MD

## 2014-05-23 NOTE — Progress Notes (Signed)
Initial Nutrition Assessment  DOCUMENTATION CODES:     INTERVENTION: Meals and Snacks: Cater to patient preferences Medical Food Supplement Therapy: Will recommend on follow if intake poor  NUTRITION DIAGNOSIS:  Increased nutrient needs related to cancer and cancer related treatments as evidenced by estimated needs, percent weight loss.  GOAL:  Patient will meet greater than or equal to 90% of their needs  MONITOR:   (Energy Intake, Digestive System, Electrolyte and Renal Profile, Anthropometrics)  REASON FOR ASSESSMENT:   (Length of stay)    ASSESSMENT:  Pt admitted with weakness and rectal ulcer, pt with h/o squamous cell cancer of anus. Pt scheduled for lap colostomy tomorrow. Port placed yesterday for chemotherapy. PMHx:  Past Medical History  Diagnosis Date  . Schizophrenia   . Asthma   . GERD (gastroesophageal reflux disease)   . Anxiety   . Depression   . Bipolar disorder   . COPD (chronic obstructive pulmonary disease)   . Occasional tremors     right hand  . PTSD (post-traumatic stress disorder)   . Shortness of breath dyspnea   . Fibromyalgia   . DVT (deep venous thrombosis) 2011    RUE  . Thyroid nodule   . DDD (degenerative disc disease), lumbar   . Spinal stenosis   . Peripheral neuropathy   . Rotator cuff tear     right  . Pneumonia 2011  . Hypothyroidism     no meds currently  . Anemia     during pregnancy only  . Hypertension     Off meds x 15 years-well controlled now per pt  . Squamous cell cancer, anus    PO Intake: pt reports eating 100% of omelet, grits and toast this am. Pt reports healthy appetite PTA and since admission. Recorded po intake 100% of meals since admission.   Medications: KCl, Colace, NS at 124m/hr Labs: Electrolyte and Renal Profile:    Recent Labs Lab 05/19/14 0646 05/20/14 0550 05/23/14 0536  BUN 5* 6 13  CREATININE 0.73 0.67 0.73  NA 139 136 137  K 3.6 4.1 4.3   Glucose Profile: No results for  input(s): GLUCAP in the last 72 hours. Protein Profile: No results for input(s): ALBUMIN in the last 168 hours. CBC Latest Ref Rng 05/23/2014 05/20/2014 05/17/2014  WBC 3.6 - 11.0 K/uL 14.1(H) 13.7(H) 14.7(H)  Hemoglobin 12.0 - 16.0 g/dL 13.1 12.8 13.8  Hematocrit 35.0 - 47.0 % 40.3 39.4 41.8  Platelets 150 - 440 K/uL 270 258 280    Pt reports continuous weight loss since October 2015; reports weighing 263lbs. (30% weight loss in 8 months) Height:  Ht Readings from Last 1 Encounters:  05/17/14 '5\' 8"'$  (1.727 m)    Weight:  Wt Readings from Last 1 Encounters:  05/17/14 184 lb 8 oz (83.689 kg)    Wt Readings from Last 10 Encounters:  05/17/14 184 lb 8 oz (83.689 kg)  05/02/14 191 lb (86.637 kg)    BMI:  Body mass index is 28.06 kg/(m^2).   Nutrition-Focused physical exam completed. Findings are WDL for fat depletion, muscle depletion, and edema.    Estimated Nutritional Needs:  Kcal:  1933-2284kcals, BEE: 1465kcals, TEE: (IF 1.1-1.3)(AF 1.2)   Protein:  84-100g protein (1.0-1.2g/kg)  Fluid:  2090-25081mof fluid (25-306mg)   Skin:  Rectal ulcer noted  Diet Order:  Diet regular Room service appropriate?: Yes; Fluid consistency:: Thin Diet NPO time specified  EDUCATION NEEDS:  No education needs identified at this time, pt  May benefit  from nutrition therapy s/p colostomy on follow-up   Intake/Output Summary (Last 24 hours) at 05/23/14 1345 Last data filed at 05/23/14 1208  Gross per 24 hour  Intake    480 ml  Output    500 ml  Net    -20 ml    Last BM:  5/16  MODERATE Care Level  Dwyane Luo, RD, LDN Pager [phone]

## 2014-05-23 NOTE — Progress Notes (Signed)
Surgery progress note  S: Pain improved.  No acute issues. O: AF/VSS, good uop GEN: NAD/A&Ox3 ABD: soft, min tender, nondistended  A/P 59 yo admit with anal cancer for pain control - plan on lap colostomy in OR tomorrow

## 2014-05-23 NOTE — Progress Notes (Signed)
[facility] Hematology/Oncology Progress Note  Date of admission: 05/17/2014  Hospital day:  05/23/2014  Chief Complaint: [name] is a 59 y.o. female with squamous cell carcinoma of the anus who was admitted with severe pain.  Subjective: Patient without any new complaints.  She feels positive about course of action and treatment plan.  She is ready to proceed with diverting colostomy tomorrow.  Port-a-cath placed on 05/20/2014.  Social History: The patient is alone today.  Allergies:  Allergies  Allergen Reactions  . Sulfa Antibiotics Rash   Scheduled Medications: . budesonide-formoterol  2 puff Inhalation BID  . docusate sodium  100 mg Oral BID  . DULoxetine  60 mg Oral QPM  . [START ON 05/24/2014] ertapenem  1 g Intravenous Once  . escitalopram  20 mg Oral QHS  . etodolac  500 mg Oral BID  . famotidine  10 mg Oral Daily  . heparin  5,000 Units Subcutaneous 3 times per day  . potassium chloride  20 mEq Oral BID  . simvastatin  40 mg Oral QHS  . tiotropium  18 mcg Inhalation [id number]   Review of Systems: GENERAL: Fatigue. No fevers or sweats. Weight loss of 80 pounds in 6 months prior to admission. PERFORMANCE STATUS (ECOG): 1-2 HEENT: No visual changes, runny nose, sore throat, mouth sores or tenderness. Lungs: No shortness of breath or cough. No hemoptysis. Cardiac: No chest pain, palpitations, orthopnea, or PND. GI: Constant stool leakage (stable). Rectal pain and bleeding. No nausea, vomiting, constipation, or melena. GU: No urgency, frequency, dysuria, or hematuria. Musculoskeletal: No back pain. No joint pain. No muscle tenderness. Extremities: No pain or swelling. Skin: No rashes or skin changes. Neuro: General weakness. No headache, numbness or weakness, balance or coordination issues. Endocrine: No diabetes, thyroid issues, hot flashes or night sweats. Psych: No mood changes, depression or anxiety.  Feels  positive. Pain: No focal pain. Review of systems: All other systems reviewed and found to be negative.  Physical Exam: Blood pressure 100/53, pulse 67, temperature 97.7 F (36.5 C), temperature source Oral, resp. rate 18, height 5' 8"  (1.727 m), weight 184 lb 8 oz (83.689 kg), SpO2 95 %.  GENERAL: Chronically fatigued appearing woman lying comfortably on the medical unit in no acute distress. She appears older than stated age. MENTAL STATUS: Alert and oriented to person, place and time. HEAD: White/gray hair. Normocephalic, atraumatic, face symmetric, no Cushingoid features. EYES: Blue eyes. Pupils equal round and reactive to light and accomodation. No conjunctivitis or scleral icterus. ENT: Oropharynx clear without lesion. Tongue normal. Mucous membranes moist.  RESPIRATORY: Clear to auscultation without rales, wheezes or rhonchi. CARDIOVASCULAR: Regular rate and rhythm without murmur, rub or gallop. ABDOMEN: Soft, non-tender, with active bowel sounds, and no hepatosplenomegaly. No masses. SKIN: No rashes, ulcers or lesions. EXTREMITIES: No edema, no skin discoloration or tenderness. No palpable cords. LYMPH NODES: No palpable cervical, supraclavicular, axillary or inguinal adenopathy  NEUROLOGICAL: Unremarkable. PSYCH: Appropriate.  Results for orders placed or performed during the hospital encounter of 05/17/14 (from the past 48 hour(s))  CBC     Status: Abnormal   Collection Time: 05/23/14  5:36 AM  Result Value Ref Range   WBC 14.1 (H) 3.6 - 11.0 K/uL   RBC 4.36 3.80 - 5.20 MIL/uL   Hemoglobin 13.1 12.0 - 16.0 g/dL   HCT 40.3 35.0 - 47.0 %   MCV 92.4 80.0 - 100.0 fL   MCH 30.1 26.0 - 34.0 pg   MCHC 32.6 32.0 -  36.0 g/dL   RDW 15.0 (H) 11.5 - 14.5 %   Platelets 270 150 - 440 K/uL  Basic metabolic panel     Status: Abnormal   Collection Time: 05/23/14  5:36 AM  Result Value Ref Range   Sodium 137 135 - 145 mmol/L   Potassium 4.3 3.5 - 5.1 mmol/L    Chloride 105 101 - 111 mmol/L   CO2 27 22 - 32 mmol/L   Glucose, Bld 90 65 - 99 mg/dL   BUN 13 6 - 20 mg/dL   Creatinine, Ser 0.73 0.44 - 1.00 mg/dL   Calcium 8.8 (L) 8.9 - 10.3 mg/dL   GFR calc non Af Amer >60 >60 mL/min   GFR calc Af Amer >60 >60 mL/min    Comment: (NOTE) The eGFR has been calculated using the CKD EPI equation. This calculation has not been validated in all clinical situations. eGFR's persistently <60 mL/min signify possible Chronic Kidney Disease.    Anion gap 5 5 - 15   Assessment:  [name] is a 59 y.o. 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  Chest, abdomen, and pelvic CT scan on 05/19/2014 revealed borderline enlarged iliac nodes (7-8 mm) and right inguinal node (13 mm).  There is a 4.7 x 4.1 x 6 cm low rectal/anal mass.  There is a 4 mm RUL pulmonary nodule of unclear significance.  HIV testing negative.  Plan: 1. Hematology/Oncology-  reviewed imaging with patient.  Clinically, tumor is a T3 lesion.  Lymph node status unclear (none pathologic, but borderline).  Anticipate 2 cycles of concurrent 5FU and mitomycin C with radiation once healed from diverting colostomy.  Will follow-up tiny pulmonary nodule with imaging studies after completion of therapy.  Anticipate outpatient GYN exam after hospital discharge for completeness.  Lequita Asal, MD  05/23/2014, 6:10 PM

## 2014-05-24 LAB — BASIC METABOLIC PANEL
Anion gap: 6 (ref 5–15)
BUN: 18 mg/dL (ref 6–20)
CHLORIDE: 105 mmol/L (ref 101–111)
CO2: 29 mmol/L (ref 22–32)
Calcium: 8.8 mg/dL — ABNORMAL LOW (ref 8.9–10.3)
Creatinine, Ser: 0.85 mg/dL (ref 0.44–1.00)
GFR calc Af Amer: >60 mL/min (ref >60)
GFR calc non Af Amer: >60 mL/min (ref >60)
Glucose, Bld: 97 mg/dL (ref 65–99)
POTASSIUM: 5.1 mmol/L (ref 3.5–5.1)
Sodium: 140 mmol/L (ref 135–145)

## 2014-05-24 LAB — CBC
HCT: 36.5 % (ref 35.0–47.0)
HEMOGLOBIN: 12 g/dL (ref 12.0–16.0)
MCH: 30.1 pg (ref 26.0–34.0)
MCHC: 32.9 g/dL (ref 32.0–36.0)
MCV: 91.4 fL (ref 80.0–100.0)
Platelets: 231 10*3/uL (ref 150–440)
RBC: 3.99 MIL/uL (ref 3.80–5.20)
RDW: 15.1 % — ABNORMAL HIGH (ref 11.5–14.5)
WBC: 12.2 10*3/uL — ABNORMAL HIGH (ref 3.6–11.0)

## 2014-05-24 MED ORDER — HYDROMORPHONE HCL 1 MG/ML IJ SOLN
1.0000 mg | INTRAMUSCULAR | Status: DC | PRN
  Administered 2014-05-24 – 2014-05-27 (×4): 1 mg via INTRAVENOUS
  Filled 2014-05-24 (×4): qty 1

## 2014-05-24 NOTE — Care Management (Signed)
Patient to surgery for colonostomy today. Have discussed case with attending and also presented at quality collaborative's meeting. Discharge plan at this time is home with home health.

## 2014-05-24 NOTE — Progress Notes (Signed)
Spoke with Dr. Rexene Edison order for Dilaudid '1mg'$  every 3hrs prn

## 2014-05-24 NOTE — Progress Notes (Signed)
Per Dr. Rexene Edison Pt can be put on regular diet NPO after midnight. Surgery will be tomorrow and not this afternoon.

## 2014-05-25 NOTE — Progress Notes (Addendum)
Patient A/O, no noted distress. Noted to have some pain, administered prn pain regimen (see EMar). Patient is happy about her progression. Tolerated all meds. NPO after midnight. Held heparin resulted to scheduled procedure. Staff will continue to monitor and meet needs. Patient had CHG bath this morning, linen changed/.

## 2014-05-25 NOTE — Progress Notes (Signed)
Surgery Progress Note  S: Still with anal pain but improved O: AF/VSS, good uop GEN: NAD/A&Ox3 ABD: soft, nontender, nondistended  A/P 59 yo with anal cancer, admit for pain control, workup - diverting colostomy in am

## 2014-05-26 ENCOUNTER — Encounter
Admission: EM | Disposition: A | Discharge status: Home / Self Care | Payer: Self-pay | Source: Home / Self Care | Attending: Surgery

## 2014-05-26 ENCOUNTER — Inpatient Hospital Stay: Payer: Commercial Managed Care - HMO | Admitting: Certified Registered"

## 2014-05-26 HISTORY — PX: LAPAROSCOPIC DIVERTED COLOSTOMY: SHX5892

## 2014-05-26 LAB — PLATELET COUNT: Platelets: 208 10*3/uL (ref 150–440)

## 2014-05-26 SURGERY — CREATION, COLOSTOMY, DIVERTING, LAPAROSCOPIC
Anesthesia: General | Wound class: Clean Contaminated

## 2014-05-26 MED ORDER — MIDAZOLAM HCL 2 MG/2ML IJ SOLN
INTRAMUSCULAR | Status: DC | PRN
Start: 2014-05-26 — End: 2014-05-26
  Administered 2014-05-26: 2 mg via INTRAVENOUS

## 2014-05-26 MED ORDER — LACTATED RINGERS IV SOLN
INTRAVENOUS | Status: DC | PRN
  Administered 2014-05-26: 08:00:00 via INTRAVENOUS

## 2014-05-26 MED ORDER — FENTANYL CITRATE (PF) 100 MCG/2ML IJ SOLN
INTRAMUSCULAR | Status: DC | PRN
  Administered 2014-05-26 (×2): 50 ug via INTRAVENOUS
  Administered 2014-05-26: 100 ug via INTRAVENOUS

## 2014-05-26 MED ORDER — FENTANYL CITRATE (PF) 100 MCG/2ML IJ SOLN
50.0000 ug | INTRAMUSCULAR | Status: DC | PRN
  Administered 2014-05-26: 50 ug via INTRAVENOUS

## 2014-05-26 MED ORDER — DEXTROSE 5 % IV SOLN
1.0000 g | Freq: Three times a day (TID) | INTRAVENOUS | Status: DC
  Administered 2014-05-26 – 2014-05-29 (×10): 1 g via INTRAVENOUS
  Filled 2014-05-26 (×15): qty 1

## 2014-05-26 MED ORDER — PROPOFOL 10 MG/ML IV BOLUS
INTRAVENOUS | Status: DC | PRN
  Administered 2014-05-26: 150 mg via INTRAVENOUS

## 2014-05-26 MED ORDER — NEOSTIGMINE METHYLSULFATE 10 MG/10ML IV SOLN
INTRAVENOUS | Status: DC | PRN
  Administered 2014-05-26: 3 mg via INTRAVENOUS

## 2014-05-26 MED ORDER — HYDROMORPHONE HCL 1 MG/ML IJ SOLN
0.2500 mg | INTRAMUSCULAR | Status: DC | PRN
  Administered 2014-05-26 (×4): 0.5 mg via INTRAVENOUS

## 2014-05-26 MED ORDER — PHENYLEPHRINE HCL 10 MG/ML IJ SOLN
INTRAMUSCULAR | Status: DC | PRN
  Administered 2014-05-26: 100 ug via INTRAVENOUS

## 2014-05-26 MED ORDER — LIDOCAINE HCL (CARDIAC) 20 MG/ML IV SOLN
INTRAVENOUS | Status: DC | PRN
  Administered 2014-05-26: 80 mg via INTRAVENOUS

## 2014-05-26 MED ORDER — LIDOCAINE-EPINEPHRINE (PF) 1 %-1:200000 IJ SOLN
INTRAMUSCULAR | Status: AC
  Filled 2014-05-26: qty 30

## 2014-05-26 MED ORDER — ROCURONIUM BROMIDE 100 MG/10ML IV SOLN
INTRAVENOUS | Status: DC | PRN
  Administered 2014-05-26: 10 mg via INTRAVENOUS
  Administered 2014-05-26: 50 mg via INTRAVENOUS

## 2014-05-26 MED ORDER — DEXAMETHASONE SODIUM PHOSPHATE 4 MG/ML IJ SOLN
INTRAMUSCULAR | Status: DC | PRN
  Administered 2014-05-26: 5 mg via INTRAVENOUS

## 2014-05-26 MED ORDER — GLYCOPYRROLATE 0.2 MG/ML IJ SOLN
INTRAMUSCULAR | Status: DC | PRN
  Administered 2014-05-26: 0.6 mg via INTRAVENOUS

## 2014-05-26 MED ORDER — HYDROMORPHONE HCL 1 MG/ML IJ SOLN
INTRAMUSCULAR | Status: AC
Start: 2014-05-26 — End: 2014-05-26
  Filled 2014-05-26: qty 1

## 2014-05-26 MED ORDER — LIDOCAINE-EPINEPHRINE (PF) 1 %-1:200000 IJ SOLN
INTRAMUSCULAR | Status: DC | PRN
  Administered 2014-05-26: 7 mL via INTRADERMAL

## 2014-05-26 MED ORDER — DEXAMETHASONE SODIUM PHOSPHATE 4 MG/ML IJ SOLN
8.0000 mg | Freq: Once | INTRAMUSCULAR | Status: DC | PRN
  Filled 2014-05-26: qty 2

## 2014-05-26 MED ORDER — DEXTROSE 5 % IV SOLN
1.0000 g | INTRAVENOUS | Status: DC | PRN
  Administered 2014-05-26: 1 g via INTRAVENOUS

## 2014-05-26 MED ORDER — FENTANYL CITRATE (PF) 100 MCG/2ML IJ SOLN
INTRAMUSCULAR | Status: AC
  Filled 2014-05-26: qty 2

## 2014-05-26 MED ORDER — HYDROMORPHONE HCL 1 MG/ML IJ SOLN
INTRAMUSCULAR | Status: AC
  Filled 2014-05-26: qty 1

## 2014-05-26 MED ORDER — ONDANSETRON HCL 4 MG/2ML IJ SOLN
INTRAMUSCULAR | Status: DC | PRN
Start: 2014-05-26 — End: 2014-05-26
  Administered 2014-05-26: 4 mg via INTRAVENOUS

## 2014-05-26 SURGICAL SUPPLY — 72 items
APPLIER CLIP ROT 10 11.4 M/L (STAPLE)
BLADE SURG SZ10 CARB STEEL (BLADE) ×2 IMPLANT
CANISTER SUCT 1200ML W/VALVE (MISCELLANEOUS) ×2 IMPLANT
CATH TRAY 16F METER LATEX (MISCELLANEOUS) ×2 IMPLANT
CLEANER CAUTERY TIP 5X5 PAD (MISCELLANEOUS) IMPLANT
CLIP APPLIE ROT 10 11.4 M/L (STAPLE) IMPLANT
CLIP TI MEDIUM 6 (CLIP) IMPLANT
CUTTER LINEAR ENDO 35 ETS (STAPLE) IMPLANT
DRAPE INCISE IOBAN 66X45 STRL (DRAPES) IMPLANT
DRAPE LEGGINS SURG 28X43 STRL (DRAPES) IMPLANT
DRAPE TABLE BACK 80X90 (DRAPES) IMPLANT
DRAPE UNDER BUTTOCK W/FLU (DRAPES) ×2 IMPLANT
DRESSING TELFA 4X3 1S ST N-ADH (GAUZE/BANDAGES/DRESSINGS) ×2 IMPLANT
DRSG TEGADERM 2-3/8X2-3/4 SM (GAUZE/BANDAGES/DRESSINGS) ×4 IMPLANT
DRSG TEGADERM 4X4.75 (GAUZE/BANDAGES/DRESSINGS) IMPLANT
ELECT CAUTERY NEEDLE TIP 1.0 (MISCELLANEOUS)
ELECTRODE CAUTERY NEDL TIP 1.0 (MISCELLANEOUS) IMPLANT
GAUZE SPONGE 4X4 12PLY STRL (GAUZE/BANDAGES/DRESSINGS) IMPLANT
GELPORT LAPAROSCOPIC (MISCELLANEOUS) IMPLANT
GLOVE BIO SURGEON STRL SZ7.5 (GLOVE) ×8 IMPLANT
GLOVE INDICATOR 8.0 STRL GRN (GLOVE) ×2 IMPLANT
GOWN STRL REUS W/ TWL LRG LVL3 (GOWN DISPOSABLE) ×3 IMPLANT
GOWN STRL REUS W/TWL LRG LVL3 (GOWN DISPOSABLE) ×3
HANDLE YANKAUER SUCT BULB TIP (MISCELLANEOUS) IMPLANT
IRRIGATION STRYKERFLOW (MISCELLANEOUS) IMPLANT
IRRIGATOR STRYKERFLOW (MISCELLANEOUS)
IV NS 1000ML (IV SOLUTION)
IV NS 1000ML BAXH (IV SOLUTION) IMPLANT
JELLY LUB 2OZ STRL (MISCELLANEOUS)
JELLY LUBE 2OZ STRL (MISCELLANEOUS) IMPLANT
KIT RM TURNOVER STRD PROC AR (KITS) ×2 IMPLANT
LABEL OR SOLS (LABEL) IMPLANT
LIGASURE 5MM LAPAROSCOPIC (INSTRUMENTS) IMPLANT
LOOP OSTOMY BRIDGE (OSTOMY) ×2 IMPLANT
NDL INSUFF ACCESS 14 VERSASTEP (NEEDLE) IMPLANT
NEEDLE FILTER BLUNT 18X 1/2SAF (NEEDLE)
NEEDLE FILTER BLUNT 18X1 1/2 (NEEDLE) IMPLANT
NEEDLE HYPO 25X1 1.5 SAFETY (NEEDLE) ×2 IMPLANT
NS IRRIG 1000ML POUR BTL (IV SOLUTION) ×2 IMPLANT
PACK BASIN MINOR ARMC (MISCELLANEOUS) IMPLANT
PACK LAP CHOLECYSTECTOMY (MISCELLANEOUS) ×2 IMPLANT
PAD ABD DERMACEA PRESS 5X9 (GAUZE/BANDAGES/DRESSINGS) IMPLANT
PAD CLEANER CAUTERY TIP 5X5 (MISCELLANEOUS)
PAD GROUND ADULT SPLIT (MISCELLANEOUS) ×2 IMPLANT
PENCIL ELECTRO HAND CTR (MISCELLANEOUS) ×2 IMPLANT
SCISSORS METZENBAUM CVD 33 (INSTRUMENTS) ×2 IMPLANT
SLEEVE ENDOPATH XCEL 5M (ENDOMECHANICALS) ×2 IMPLANT
SPONGE LAP 18X18 5 PK (GAUZE/BANDAGES/DRESSINGS) ×2 IMPLANT
STAPLER AUT SUT LDS 15W (STAPLE) IMPLANT
STRIP CLOSURE SKIN 1/2X4 (GAUZE/BANDAGES/DRESSINGS) ×2 IMPLANT
SUT CHROMIC 3 0 SH 27 (SUTURE) IMPLANT
SUT CHROMIC 3-0 (SUTURE)
SUT CHROMIC 3-0 54XMFL REEL CR (SUTURE)
SUT ETHILON 4-0 (SUTURE)
SUT ETHILON 4-0 FS2 18XMFL BLK (SUTURE)
SUT MAXON ABS #0 GS21 30IN (SUTURE) IMPLANT
SUT MNCRL AB 4-0 PS2 18 (SUTURE) ×2 IMPLANT
SUT MON AB 3-0 SH 27 (SUTURE) ×10 IMPLANT
SUT NYLON 2-0 (SUTURE) IMPLANT
SUT SILK 3-0 (SUTURE) ×1
SUT SILK 3-0 SH-1 18XCR BRD (SUTURE) ×1
SUT VIC AB 3-0 SH 27 (SUTURE) ×1
SUT VIC AB 3-0 SH 27X BRD (SUTURE) ×1 IMPLANT
SUTURE CHRMC 3-0 54XMFL REL CR (SUTURE) IMPLANT
SUTURE ETHLN 4-0 FS2 18XMF BLK (SUTURE) IMPLANT
SUTURE SILK 3-0 SH-1 18XCR BRD (SUTURE) ×1 IMPLANT
SYRINGE 10CC LL (SYRINGE) IMPLANT
TROCAR Z-THREAD FIOS 11X100 BL (TROCAR) ×2 IMPLANT
TROCAR Z-THREAD OPTICAL 5X100M (TROCAR) ×2 IMPLANT
TROCAR Z-THREAD SLEEVE 11X100 (TROCAR) ×2 IMPLANT
TUBING INSUFFLATOR HEATED (MISCELLANEOUS) ×2 IMPLANT
WATER STERILE IRR 1000ML POUR (IV SOLUTION) IMPLANT

## 2014-05-26 NOTE — Anesthesia Postprocedure Evaluation (Signed)
  Anesthesia Post-op Note  Patient: [name]  Procedure(s) Performed: Procedure(s): LAPAROSCOPIC DIVERTED COLOSTOMY (N/A)  Anesthesia type:General  Patient location: PACU  Post pain: Pain level controlled  Post assessment: Post-op Vital signs reviewed, Patient's Cardiovascular Status Stable, Respiratory Function Stable, Patent Airway and No signs of Nausea or vomiting  Post vital signs: Reviewed and stable  Last Vitals:  Filed Vitals:   05/26/14 1048  BP: 113/66  Pulse: 77  Temp: 36.7 C  Resp: 14    Level of consciousness: awake, alert  and patient cooperative  Complications: No apparent anesthesia complications

## 2014-05-26 NOTE — Anesthesia Preprocedure Evaluation (Signed)
Anesthesia Evaluation  Patient identified by MRN, date of birth, ID band Patient awake    Reviewed: Allergy & Precautions, NPO status , Patient's Chart, lab work & pertinent test results  Airway Mallampati: II  TM Distance: >3 FB Neck ROM: Full    Dental  (+) Edentulous Upper   Pulmonary shortness of breath and with exertion, asthma , COPD COPD inhaler, Current Smoker,    + decreased breath sounds      Cardiovascular hypertension, Rhythm:Regular Rate:Normal     Neuro/Psych    GI/Hepatic PUD, GERD-  Medicated and Controlled,  Endo/Other  Hypothyroidism   Renal/GU      Musculoskeletal  (+) Arthritis -, Osteoarthritis,  Fibromyalgia -  Abdominal (+)  Abdomen: soft.    Peds  Hematology  (+) anemia ,   Anesthesia Other Findings   Reproductive/Obstetrics                             Anesthesia Physical Anesthesia Plan  ASA: IV  Anesthesia Plan: General   Post-op Pain Management:    Induction: Intravenous  Airway Management Planned: Oral ETT  Additional Equipment:   Intra-op Plan:   Post-operative Plan: Extubation in OR  Informed Consent: I have reviewed the patients History and Physical, chart, labs and discussed the procedure including the risks, benefits and alternatives for the proposed anesthesia with the patient or authorized representative who has indicated his/her understanding and acceptance.     Plan Discussed with: CRNA  Anesthesia Plan Comments:         Anesthesia Quick Evaluation

## 2014-05-26 NOTE — Op Note (Signed)
Preoperative diagnosis: Anal cancer, stool incontinence Postoperative diagnosis: Same Procedure: Lap assisted loop colostomy  Surgeon: Lynnett Langlinais Assistant: [place] Anesthesia: General EBL: Minimal Complications: None Specimen: None  Indication for surgery: [name] is a pleasant 59 yo F who presents with incontinence of stool from anal cancer.  She was brought to the OR for surgery for fecal diversion.  Details of procedure:  Informed consent obtained.  Patient brought to OR suite, induced, intubated.  General anesthesia administered.  Abdomen was prepped and draped.  Time out performed.  Supraumbilical incision made, deepened down to fascia, peritoneum entered.  Two stay sutures were placed across fasciotomy.  Hassan trocar placed and abdomen insufflated.  LLQ and RLQ 5 mm trocars placed.  Left colon mobilized until able to reach RLQ trocar.  Abdomen desufflated.  Ostomy site created at RLQ trocar site until two fingers could go through fascial defect.  Ostomy loop brought up.  Loop ostomy created with 4-0 monocryl suture.  Supraumbilical fascia closed with previously placed stay sutures.  Port site skin closed with interrupted 4-0 monocryl deep dermal sutures.  Dressing placed.  Ostomy appliance placed.  Patient awoken, extubated and brought to PACU.  No immediate complications.  Needle, sponge and instrument counts correct at end of procedure.

## 2014-05-26 NOTE — Anesthesia Preprocedure Evaluation (Deleted)
Anesthesia Evaluation    Airway        Dental   Pulmonary Current Smoker,           Cardiovascular hypertension,      Neuro/Psych    GI/Hepatic   Endo/Other    Renal/GU      Musculoskeletal   Abdominal   Peds  Hematology   Anesthesia Other Findings   Reproductive/Obstetrics                             Anesthesia Physical Anesthesia Plan Anesthesia Quick Evaluation  

## 2014-05-26 NOTE — Anesthesia Procedure Notes (Signed)
Procedure Name: Intubation Date/Time: 05/26/2014 7:51 AM Performed by: Silvana Newness Pre-anesthesia Checklist: Patient identified, Emergency Drugs available, Suction available, Patient being monitored and Timeout performed Patient Re-evaluated:Patient Re-evaluated prior to inductionOxygen Delivery Method: Circle system utilized Preoxygenation: Pre-oxygenation with 100% oxygen Intubation Type: IV induction Ventilation: Mask ventilation without difficulty Laryngoscope Size: Mac and 3 Grade View: Grade I Tube type: Oral Tube size: 7.0 mm Number of attempts: 1 Airway Equipment and Method: Stylet Placement Confirmation: ETT inserted through vocal cords under direct vision,  positive ETCO2 and breath sounds checked- equal and bilateral Secured at: 20 cm Tube secured with: Tape Dental Injury: Teeth and Oropharynx as per pre-operative assessment

## 2014-05-26 NOTE — Care Management (Addendum)
Colonostomy today. Will need wound care consult. Discussed with Nursing at progression rounds. Discussed with Dr Rexene Edison who will place order for Wound Care Nurse consult.

## 2014-05-26 NOTE — Transfer of Care (Signed)
Immediate Anesthesia Transfer of Care Note  Patient: [name]  Procedure(s) Performed: Procedure(s): LAPAROSCOPIC DIVERTED COLOSTOMY (N/A)  Patient Location: PACU  Anesthesia Type:General  Level of Consciousness: awake, alert , oriented and patient cooperative  Airway & Oxygen Therapy: Patient Spontanous Breathing and Patient connected to face mask oxygen  Post-op Assessment: Report given to RN, Post -op Vital signs reviewed and stable and Patient moving all extremities X 4  Post vital signs: Reviewed and stable  Last Vitals:  Filed Vitals:   05/26/14 0918  BP: 133/70  Pulse: 69  Temp: 36.3 C  Resp: 14    Complications: No apparent anesthesia complications

## 2014-05-27 MED ORDER — NICOTINE 10 MG IN INHA
1.0000 | RESPIRATORY_TRACT | Status: DC | PRN
  Administered 2014-05-27: 1 via RESPIRATORY_TRACT
  Filled 2014-05-27: qty 36

## 2014-05-27 NOTE — Progress Notes (Signed)
Surgery Progress Note  S: Doing well.  Anus feels much better.  Hungry O: AF/VSS, good uop GEN: NAD/A&Ox3 ABD: soft, mild tender, ostomy pink  A/P 59 yo with anal cancer s/p ostomy, port a cath - regular diet - ostomy teaching

## 2014-05-27 NOTE — Progress Notes (Signed)
Nutrition Follow-up  DOCUMENTATION CODES:     INTERVENTION:  Meals and snacks: Cater to pt prefences Nutrition diet education: Discussed medical nutrition therapy for new colostomy with pt. Materials from AND provided.  Pt verbalized understanding and expect good compliance  NUTRITION DIAGNOSIS:  Increased nutrient needs related to cancer and cancer related treatments as evidenced by estimated needs, percent weight loss, ongoing.    GOAL:  Patient will meet greater than or equal to 90% of their needs, ongoing    MONITOR:   (Energy Intake, Digestive System, Electrolyte and Renal Profile, Anthropometrics)  REASON FOR ASSESSMENT:   (Length of stay)    ASSESSMENT:  Pt s/p LLQ colostomy.  Pt reports appetite is good eating 100% of most meals since on solid foods  Electrolyte and Renal Profile:    Recent Labs Lab 05/23/14 0536 05/24/14 0517  BUN 13 18  CREATININE 0.73 0.85  NA 137 140  K 4.3 5.1   Medications: reviewed   Height:  Ht Readings from Last 1 Encounters:  05/17/14 '5\' 8"'$  (1.727 m)    Weight:  Wt Readings from Last 1 Encounters:  05/17/14 184 lb 8 oz (83.689 kg)         Wt Readings from Last 10 Encounters:  05/17/14 184 lb 8 oz (83.689 kg)  05/02/14 191 lb (86.637 kg)    BMI:  Body mass index is 28.06 kg/(m^2).  Estimated Nutritional Needs:  Kcal:  1933-2284kcals, BEE: 1465kcals, TEE: (IF 1.1-1.3)(AF 1.2)   Protein:  84-100g protein (1.0-1.2g/kg)  Fluid:  2090-2578m of fluid (25-346mkg)    Diet Order:  Diet regular Room service appropriate?: Yes; Fluid consistency:: Thin  EDUCATION NEEDS:  Education needs addressed   Intake/Output Summary (Last 24 hours) at 05/27/14 1405 Last data filed at 05/27/14 1115  Gross per 24 hour  Intake 2046.02 ml  Output   1750 ml  Net 296.02 ml    LOW Care Level Charlyn Vialpando B. AlZenia ResidesRDWindsorLDTierra Grandepager)

## 2014-05-27 NOTE — Progress Notes (Signed)
Patient noted A&OX4. IVF's infusing without difficulty. Abdomen surgical site C/D/I. Noted LLQ colostomy with a small amount of bloody drainage. Pt with surgical discomfort and pain rated 9/10. Prn pain meds given per M.D. Order (see Mar). Foley to gravity with leg strap in place. Call light and phone in reach.

## 2014-05-27 NOTE — Consult Note (Addendum)
WOC ostomy consult note Stoma type/location: LLQ loop colostomy with plastic rod in place (without sutures) Stomal assessment/size: 1 and 5/8 inch slightly oval, slightly budded ostomy.  Darker hue of red, but edematous and viable. Peristomal assessment: intact. Treatment options for stomal/peristomal skin: Patient will require a skin barrier ring with next pouch change Output: serosanguinous effluent Ostomy pouching: 2pc. 2 and 3/4 inch ostomy pouching system, Pouch is Kellie Simmering [phone], skin barrier is Kellie Simmering #2 and today, I used the center cut-out piece of the skin barrier to fill a minor defect at 3-5 o'clock.  In the future I will advise my [place] Nurse colleagues to use a portion of a skin barrier ring to fill this defect.  Education provided: Initially, patient informed me that she had a friend who would be assisting her with her ostomy pouching and stoma care.  I let her know that that was fine, but gently began our teaching session with information about the stoma characteristics, pouching materials and their characteristics, etc.  I cleansed the stoma from the bloody effluent and mucus associated with surgery and offered the patient a chance to view the stoma.  She did and was a full participant in this session following that.  Patient understands the role of the ostomy nurse, that she will be independent in emptying her pouch prior to discharge and will likely even assist with pouch changes. Her friend will still be present for a teaching session on Monday.  She will check with her and be able to confirm a time for that appointment tomorrow. Patient taught the importance of resizing the stoma and observes this procedure.  She observes the tracing and cutting out of the new skin barrier and the placement of a demonstration pouching system on this writer's abdomen. Pouch emptying is simulated and patient is shown how to perform the Lock and Roll closure mechanism of the drainable pouch.  She gives a return  demonstration of this x2.  She is asking appropriate questions and is confident in the responses received.  Three additional pouch set-ups are provided at the bedside in addition to her pattern and a pair of scissors.  Enrolled patient in [place] program:No [place] nursing team will follow, and will remain available to this patient, the nursing, surgical and medical teams.   Thanks, Maudie Flakes, MSN, RN, [place], [place], [place] [phone])

## 2014-05-28 NOTE — Consult Note (Signed)
WOC ostomy follow up Stoma type/location: LLQ, loop colostomy, support rod Stomal assessment/size: 1 5/8" per my partner's assessment yesterday Peristomal assessment: pouch intact, doing well Treatment options for stomal/peristomal skin: NA Output liquid brown effluent Ostomy pouching: 2pc. 2 3/4 in place from teaching session and pouch change yesterday Education provided:  Provided educational booklet provided, patient is barely able to keep her eyes open during our conversation. But she verbalized each of the steps of her pouch change and how to empty. She had no output in her pouch when I visited today but patient did thoroughly explain the steps including the use of a wick of toilet paper to clean the spout.  Extra supplies in the room including 3 barrier rings that I brought along with me today.  Enrolled patient in [place] Start Discharge program: Yes   [place] team will follow along with you for continued support with ostomy teaching and care [place] RN,CWOCN [phone]  .

## 2014-05-28 NOTE — Progress Notes (Signed)
Surgery Progress Note  S: No acute issues O: AF/VSS, good uop GEN: NAD/A&Ox3 ABD: soft, min tender, ostomy pink  A/P 59 yo s/p port a cath, diverting ostomy for anal cancer with leakage - regular diet - d/c foley - await bowel function

## 2014-05-29 ENCOUNTER — Encounter: Payer: Self-pay | Admitting: Surgery

## 2014-05-29 LAB — PLATELET COUNT: Platelets: 216 10*3/uL (ref 150–440)

## 2014-05-29 NOTE — Care Management (Signed)
Patient doing well today. Stated that she feels that she will be OK at her boarding house. Would like a bedside commode prior to discharge due to sharing bathroom with other tenants. Will ask MD for Rx prior to discharge. Patient has been ambulating without assistance and stated that she does have a cane at her residence. Patient daughter lives in same residence. Uses [employer] for transportation. No O2. Wound care consult has been done nad patient stated that supplies have been ordered. Some supplies in room. Patient stated that she has been changing her own colostomy appliance. Anticipate discharge tomorrow with few needs.

## 2014-05-29 NOTE — Consult Note (Signed)
WOC ostomy consult note Stoma type/location: LLQ Colostomy, retention rod in place.  Potential for discharge today, per RN.  Stomal assessment/size: 1 3/4" round, only slightly budded.  Abdominal creasing/defect noted from 2 to 5 o'clock.  Will fill in this area with a barrier ring.  Peristomal assessment: Intact.  Barrier ring due to abdominal creasing and only slightly budded stoma.  Treatment options for stomal/peristomal skin: Barrier ring Output Thick, brown stool.  Lots of flatus noted.   Ostomy pouching:2pc.  2 3/4" pouch with barrier ring.    Education provided: Patient first emptied her pouch as it was over half full.  Discussed frequency of emptying when 1/3 to 1/2 full.  Bag is quite heavy with thick brown stool.  After emptying completed and technique reinforced, pouch is removed by patient using push/pull technique.  Skin is intact.Cleansing with mild/soap and water reviewed.   Barrier ring is placed around stoma. Working around rods, pouch is applied.  RN to inquire if rods can be removed prior to discharge. This would simplify pouching for patient, if she is ready. New barrier and pouch applied.  Roll closure is reviewed and completed.  Patient states she is feeling more comfortable with process.  Anticipates going home with [facility] for ongoing education.  Pack of 3 pouch systems gathered for patient.   Enrolled patient in [place] program: Yes- will enroll this AM.  WOC team will continue to follow and remain available to patient, medical and nursing teams.  Domenic Moras RN BSN [place] Pager [phone]

## 2014-05-29 NOTE — Progress Notes (Signed)
Surgery  POD 3  S/P diverting loop colostomy.  She is doing well eating and is learning ostomy care  Pain seems wells controlled.  Filed Vitals:   05/28/14 2105 05/29/14 0120 05/29/14 0821 05/29/14 1545  BP: 118/72 114/63 131/75 108/57  Pulse: 68 68 77 78  Temp: 98.3 F (36.8 C) 98.5 F (36.9 C) 98.5 F (36.9 C) 97.7 F (36.5 C)  TempSrc: Oral Oral Oral Oral  Resp: '20 18 19 18  '$ Height:      Weight:      SpO2: 100% 97% 96% 100%    PE:  Her abdomen is soft and non tender her ostomy is well functioning in the ostomy bridge still present.  Labs CBC Latest Ref Rng 05/29/2014 05/26/2014 05/24/2014  WBC 3.6 - 11.0 K/uL - - 12.2(H)  Hemoglobin 12.0 - 16.0 g/dL - - 12.0  Hematocrit 35.0 - 47.0 % - - 36.5  Platelets 150 - 440 K/uL 216 208 231   BMP Latest Ref Rng 05/24/2014 05/23/2014 05/20/2014  Glucose 65 - 99 mg/dL 97 90 94  BUN 6 - 20 mg/dL '18 13 6  '$ Creatinine 0.44 - 1.00 mg/dL 0.85 0.73 0.67  Sodium 135 - 145 mmol/L 140 137 136  Potassium 3.5 - 5.1 mmol/L 5.1 4.3 4.1  Chloride 101 - 111 mmol/L 105 105 101  CO2 22 - 32 mmol/L '29 27 30  '$ Calcium 8.9 - 10.3 mg/dL 8.8(L) 8.8(L) 8.5(L)    IMP  overall she is doing well. It looks that she will need some home health upon discharge.   Plan:  I will arrange for case management consultation and evaluation for tomorrow. I will Med lock her IV fluids. I will plan on removing her ostomy bridge tomorrow. I will contact her oncologist.

## 2014-05-30 MED ORDER — ALBUTEROL SULFATE (2.5 MG/3ML) 0.083% IN NEBU: 2.5000 mg | INHALATION_SOLUTION | Freq: Four times a day (QID) | RESPIRATORY_TRACT | Status: DC | PRN

## 2014-05-30 MED ORDER — BUDESONIDE-FORMOTEROL FUMARATE 160-4.5 MCG/ACT IN AERO: 2.0000 | INHALATION_SPRAY | Freq: Two times a day (BID) | RESPIRATORY_TRACT | Status: DC

## 2014-05-30 MED ORDER — CLONAZEPAM 1 MG PO TABS: 1.0000 mg | ORAL_TABLET | Freq: Three times a day (TID) | ORAL | Status: DC | PRN

## 2014-05-30 MED ORDER — HYDROCODONE-ACETAMINOPHEN 5-325 MG PO TABS: 2.0000 | ORAL_TABLET | Freq: Four times a day (QID) | ORAL | Status: DC | PRN

## 2014-05-30 MED ORDER — OXYCODONE-ACETAMINOPHEN 5-325 MG PO TABS: 2.0000 | ORAL_TABLET | Freq: Four times a day (QID) | ORAL | Status: DC | PRN

## 2014-05-30 NOTE — Care Management (Signed)
Home health setup with [facility] after choice offered to patient. [facility] at [facility] confirms referral. DME provided to room by will form [facility]. Walker and bedside commode. Patient stated that she has been changing ostomy and emptying without difficulty. Patient sent home with supplies for 3 days and stated that Wound care nurse had setup home delivery of supplies. Follow ups scheduled by nursing staff. No other needs identified. Patient to call me if any difficulty with ostomy supplies. Family present at group home to assist if need be.

## 2014-05-30 NOTE — Progress Notes (Signed)
She is postoperative day #4. Her pain is well-controlled. Ostomy is functioning.  Filed Vitals:   05/30/14 0856  BP: 121/63  Pulse: 79  Temp: 98.5 F (36.9 C)  Resp:    Her abdomen is soft and nontender the ostomy bridge was removed.  I spoke with case management. Arrangements were made for home health bedside commode and 4 pronged walker. I contacted her oncologist yesterday via the EMR and arrangements will be made for her to follow up the cancer center next week.

## 2014-06-02 ENCOUNTER — Emergency Department: Payer: Commercial Managed Care - HMO

## 2014-06-02 ENCOUNTER — Emergency Department
Admission: EM | Admit: 2014-06-02 | Discharge: 2014-06-02 | Disposition: A | Discharge status: Home / Self Care | Payer: Commercial Managed Care - HMO | Attending: Emergency Medicine | Admitting: Emergency Medicine

## 2014-06-02 ENCOUNTER — Encounter: Payer: Self-pay | Admitting: *Deleted

## 2014-06-02 DIAGNOSIS — K5900 Constipation, unspecified: Secondary | ICD-10-CM | POA: Insufficient documentation

## 2014-06-02 DIAGNOSIS — I10 Essential (primary) hypertension: Secondary | ICD-10-CM | POA: Diagnosis not present

## 2014-06-02 DIAGNOSIS — K6289 Other specified diseases of anus and rectum: Secondary | ICD-10-CM | POA: Diagnosis present

## 2014-06-02 DIAGNOSIS — Z79899 Other long term (current) drug therapy: Secondary | ICD-10-CM | POA: Insufficient documentation

## 2014-06-02 DIAGNOSIS — Z720 Tobacco use: Secondary | ICD-10-CM | POA: Diagnosis not present

## 2014-06-02 DIAGNOSIS — C44520 Squamous cell carcinoma of anal skin: Secondary | ICD-10-CM | POA: Diagnosis not present

## 2014-06-02 DIAGNOSIS — C210 Malignant neoplasm of anus, unspecified: Secondary | ICD-10-CM

## 2014-06-02 MED ORDER — DOCUSATE SODIUM 100 MG PO CAPS
200.0000 mg | ORAL_CAPSULE | Freq: Once | ORAL | Status: AC
  Administered 2014-06-02: 200 mg via ORAL

## 2014-06-02 MED ORDER — OXYCODONE-ACETAMINOPHEN 5-325 MG PO TABS: 2.0000 | ORAL_TABLET | Freq: Once | ORAL | Status: AC

## 2014-06-02 MED ORDER — POLYETHYLENE GLYCOL 3350 17 GM/SCOOP PO POWD: ORAL | Status: DC

## 2014-06-02 MED ORDER — OXYCODONE-ACETAMINOPHEN 5-325 MG PO TABS
ORAL_TABLET | ORAL | Status: AC
  Administered 2014-06-02: 2
  Filled 2014-06-02: qty 2

## 2014-06-02 MED ORDER — DOCUSATE SODIUM 100 MG PO CAPS: 200.0000 mg | ORAL_CAPSULE | Freq: Two times a day (BID) | ORAL | Status: DC

## 2014-06-02 MED ORDER — DOCUSATE SODIUM 100 MG PO CAPS
ORAL_CAPSULE | ORAL | Status: AC
Start: 2014-06-02 — End: 2014-06-02
  Administered 2014-06-02: 200 mg via ORAL
  Filled 2014-06-02: qty 2

## 2014-06-02 MED ORDER — PRAMOXINE HCL 1 % RE FOAM: 1.0000 "application " | Freq: Three times a day (TID) | RECTAL | Status: DC | PRN

## 2014-06-02 MED ORDER — TUCKS 50 % EX PADS: 1.0000 "application " | MEDICATED_PAD | Freq: Four times a day (QID) | CUTANEOUS | Status: DC

## 2014-06-02 MED ORDER — SENNA 8.6 MG PO TABS: 2.0000 | ORAL_TABLET | Freq: Two times a day (BID) | ORAL | Status: DC

## 2014-06-02 MED ORDER — OXYCODONE-ACETAMINOPHEN 5-325 MG PO TABS: 1.0000 | ORAL_TABLET | Freq: Four times a day (QID) | ORAL | Status: DC | PRN

## 2014-06-02 NOTE — ED Notes (Signed)
Patient transported to X-ray 

## 2014-06-02 NOTE — Discharge Summary (Signed)
Physician Discharge Summary  Patient ID: [name] MRN: [id number] DOB/AGE: January 21, 1955 59 y.o.  Admit date: 05/17/2014 Discharge date: 05/30/2014  Admission Diagnoses:Painful rectal ulcer  Discharge Diagnoses:  Principal Problem:   Rectal ulcer Active Problems:   Squamous cell cancer, anus   Discharged Condition: good  Hospital Course: [name] is a pleasant 59 yo with a new diagnosis of rectal cancer.  She was admitted for pain associated with cancer.  Her pain was controlled and then she subsequently underwent port a cath placement with Dr. Burt Knack and later laparoscopic loop colostomy placement with myself.  Postoperatively she was began on diet and she was advanced from IV to oral pain medications.  At time of discharge, [name] was tolerating a diet, having good pain control and was having BM per her ostomy.  Consults: hematology/oncology  Significant Diagnostic Studies: radiology: CT scan: Anal mass with possible periiliac lymph enlargement  Treatments: surgery: Port a cath placement and lap loop colostomy  Discharge Exam: Blood pressure 121/63, pulse 79, temperature 98.5 F (36.9 C), temperature source Oral, resp. rate 18, height '5\' 8"'$  (1.727 m), weight 83.689 kg (184 lb 8 oz), SpO2 97 %. General appearance: alert GI: soft, non-tender; bowel sounds normal; no masses,  no organomegaly, ostomy pink and productive  Disposition: 01-Home or Self Care  Discharge Instructions    Call MD for:  difficulty breathing, headache or visual disturbances    Complete by:  As directed      Call MD for:  persistant nausea and vomiting    Complete by:  As directed      Call MD for:  redness, tenderness, or signs of infection (pain, swelling, redness, odor or green/yellow discharge around incision site)    Complete by:  As directed      Call MD for:  severe uncontrolled pain    Complete by:  As directed      Call MD for:  temperature >100.4    Complete by:  As directed       Diet general    Complete by:  As directed      Discharge instructions    Complete by:  As directed   DISCHARGE INSTRUCTIONS TO PATIENT  REMINDER:  Carry a list of your medications and allergies with you at all times Call your pharmacy at least 1 week in advance to refill prescriptions Do not mix any prescribed pain medicine with alcohol Do not drive any motor vehicles while taking pain medication. Take medications with food.  Do not retake a pain medication if you vomit after taking it.  Activity: no lifting more than 15 pounds until instructed by your doctor.   Dressing Care Instruction (if applicable):   May Shower-  Call office if any questions regarding this activity.  Dry Dressing as needed to operative site.  Drain care instructions provided to you in the hospital.   Follow-up appointments (date to return to physician): Call for appointment with Dr. Sherri Rad, MD at [phone] or [phone]  If need MD on call after hours and on weekends call Hospital operator at [phone] as ask to speak to Surgeon on call for [facility].  Call Surgeon if you have: Temperature greater than 100.4 Persistent nausea and vomiting Severe uncontrolled pain Redness, tenderness, or signs of infection (pain, swelling, redness, odor or green/yellow discharge around the site) Difficulty breathing, headache or visual disturbances Hives Persistent dizziness or light-headedness Extreme fatigue Any other questions or concerns you may have after discharge  In an emergency, call 911 or go to an Emergency Department at a nearby hospital  Diet:  Resume your usual diet.  Avoid spicy, greasy or heavy foods.  If you have nausea or vomiting, go back to liquids.  If you cannot keep liquids down, call your doctor.  Avoid alcohol consumption while on prescription pain medications. Good nutrition promotes healing. Increase fiber and fluids.     I understand and acknowledge receipt of  the above instructions.                                                                                                                                        Patient or Guardian Signature                                                                    Date/Time                                                                                                                                        Physician's or R.N.'s Signature                                                                  Date/Time  The discharge instructions have been reviewed with the patient and/or Family Member/Parent/Guardian.  Patient and/or Family Member/Parent/Guardian signed and retained a printed copy.     Discharge wound care:    Complete by:  As directed   Routine ostomy care with Home health arrangement upon discharge     For home use only DME Bedside commode    Complete by:  As directed      Increase activity slowly    Complete by:  As directed      Walker leg extenders, set of 4    Complete by:  As directed  Medication List    STOP taking these medications        albuterol 108 (90 BASE) MCG/ACT inhaler  Commonly known as:  PROVENTIL HFA;VENTOLIN HFA  Replaced by:  albuterol (2.5 MG/3ML) 0.083% nebulizer solution      TAKE these medications        albuterol (2.5 MG/3ML) 0.083% nebulizer solution  Commonly known as:  PROVENTIL  Inhale 3 mLs (2.5 mg total) into the lungs every 6 (six) hours as needed for wheezing or shortness of breath.     budesonide-formoterol 160-4.5 MCG/ACT inhaler  Commonly known as:  SYMBICORT  Inhale 2 puffs into the lungs 2 (two) times daily.     clonazePAM 1 MG tablet  Commonly known as:  KLONOPIN  Take 1 tablet (1 mg total) by mouth 3 (three) times daily as needed (anxiety).     DULoxetine 60 MG capsule  Commonly known as:  CYMBALTA  Take 60 mg by mouth every evening.     escitalopram 20 MG tablet  Commonly known as:  LEXAPRO  Take 20 mg by  mouth at bedtime.     etodolac 500 MG tablet  Commonly known as:  LODINE  Take 500 mg by mouth 2 (two) times daily.     HYDROcodone-acetaminophen 5-325 MG per tablet  Commonly known as:  NORCO/VICODIN  Take 2 tablets by mouth every 6 (six) hours as needed for moderate pain.     oxyCODONE-acetaminophen 5-325 MG per tablet  Commonly known as:  PERCOCET/ROXICET  Take 2 tablets by mouth every 6 (six) hours as needed for moderate pain or severe pain.     ranitidine 150 MG tablet  Commonly known as:  ZANTAC  Take 150 mg by mouth 2 (two) times daily.     simvastatin 40 MG tablet  Commonly known as:  ZOCOR  Take 40 mg by mouth at bedtime.     tiotropium 18 MCG inhalation capsule  Commonly known as:  SPIRIVA  Place 18 mcg into inhaler and inhale every morning.     zolpidem 5 MG tablet  Commonly known as:  AMBIEN  Take 5 mg by mouth at bedtime as needed for sleep.           Follow-up Information    Follow up with Florene Glen, MD. Go on 06/06/2014.   Specialties:  Surgery, Radiology   Why:  at 11:30am for wound re-check   Contact information:   [place] [address] [phone]       Signed: Marlyce Huge 06/02/2014, 10:11 AM

## 2014-06-02 NOTE — ED Notes (Signed)
MD at bedside. 

## 2014-06-02 NOTE — ED Provider Notes (Signed)
[facility] Emergency Department Provider Note  ____________________________________________  Time seen: 3:10 PM  I have reviewed the triage vital signs and the nursing notes.   HISTORY  Chief Complaint Rectal Pain    HPI [name] is a 59 y.o. female who recently had a colostomy placedwho had a loop colostomy placed on May 21 by Dr. Rexene Edison. She has been neurologically she's had trouble with colostomy bag leaking, but then today she has noticed after changing her colostomy bag that she has not had any stool output. She also has a loss of appetite and some pain at the ostomy site. She also complains of drainage from her anus around her anal cancer. No fevers or chills, no vomiting. No chest pain or shortness of breath or dizziness lightheadedness or syncope.  Pain at the site is aching constant waxing and waning nonradiating no aggravating or alleviating factors   Past Medical History  Diagnosis Date  . Schizophrenia   . Asthma   . GERD (gastroesophageal reflux disease)   . Anxiety   . Depression   . Bipolar disorder   . COPD (chronic obstructive pulmonary disease)   . Occasional tremors     right hand  . PTSD (post-traumatic stress disorder)   . Shortness of breath dyspnea   . Fibromyalgia   . DVT (deep venous thrombosis) 2011    RUE  . Thyroid nodule   . DDD (degenerative disc disease), lumbar   . Spinal stenosis   . Peripheral neuropathy   . Rotator cuff tear     right  . Pneumonia 2011  . Hypothyroidism     no meds currently  . Anemia     during pregnancy only  . Hypertension     Off meds x 15 years-well controlled now per pt  . Squamous cell cancer, anus     Patient Active Problem List   Diagnosis Date Noted  . Squamous cell cancer, anus   . Rectal ulcer 05/17/2014    Past Surgical History  Procedure Laterality Date  . Foot surgery Right   . Tubal ligation    . Eye surgery Bilateral   . Mouth surgery  2002  .  Rectal biopsy N/A 05/08/2014    Procedure: BIOPSY RECTAL;  Surgeon: Marlyce Huge, MD;  Location: [facility] ORS;  Service: General;  Laterality: N/A;  . Evaluation under anesthesia with hemorrhoidectomy N/A 05/08/2014    Procedure: EXAM UNDER ANESTHESIA WITH HEMORRHOIDECTOMY;  Surgeon: Marlyce Huge, MD;  Location: [facility] ORS;  Service: General;  Laterality: N/A;  . Portacath placement N/A 05/20/2014    Procedure: INSERTION PORT-A-CATH;  Surgeon: Florene Glen, MD;  Location: [facility] ORS;  Service: General;  Laterality: N/A;  . Laparoscopic diverted colostomy N/A 05/26/2014    Procedure: LAPAROSCOPIC DIVERTED COLOSTOMY;  Surgeon: Marlyce Huge, MD;  Location: [facility] ORS;  Service: General;  Laterality: N/A;    Current Outpatient Rx  Name  Route  Sig  Dispense  Refill  . budesonide-formoterol (SYMBICORT) 160-4.5 MCG/ACT inhaler   Inhalation   Inhale 2 puffs into the lungs 2 (two) times daily.   1 Inhaler   12   . clonazePAM (KLONOPIN) 1 MG tablet   Oral   Take 1 tablet (1 mg total) by mouth 3 (three) times daily as needed (anxiety).   30 tablet   0   . DULoxetine (CYMBALTA) 60 MG capsule   Oral   Take 60 mg by mouth every evening.         Marland Kitchen  escitalopram (LEXAPRO) 20 MG tablet   Oral   Take 20 mg by mouth at bedtime.         Marland Kitchen etodolac (LODINE) 500 MG tablet   Oral   Take 500 mg by mouth 2 (two) times daily.         Marland Kitchen oxyCODONE-acetaminophen (PERCOCET/ROXICET) 5-325 MG per tablet   Oral   Take 2 tablets by mouth every 6 (six) hours as needed for moderate pain or severe pain.   30 tablet   0   . ranitidine (ZANTAC) 150 MG tablet   Oral   Take 150 mg by mouth 2 (two) times daily.         . simvastatin (ZOCOR) 40 MG tablet   Oral   Take 40 mg by mouth every evening.          . tiotropium (SPIRIVA) 18 MCG inhalation capsule   Inhalation   Place 18 mcg into inhaler and inhale every morning.          . zolpidem (AMBIEN) 5 MG tablet   Oral   Take 5  mg by mouth at bedtime as needed for sleep.         Marland Kitchen albuterol (PROVENTIL) (2.5 MG/3ML) 0.083% nebulizer solution   Inhalation   Inhale 3 mLs (2.5 mg total) into the lungs every 6 (six) hours as needed for wheezing or shortness of breath. Patient not taking: Reported on 06/02/2014   75 mL   12   . docusate sodium (COLACE) 100 MG capsule   Oral   Take 2 capsules (200 mg total) by mouth 2 (two) times daily.   120 capsule   0   . HYDROcodone-acetaminophen (NORCO/VICODIN) 5-325 MG per tablet   Oral   Take 2 tablets by mouth every 6 (six) hours as needed for moderate pain. Patient not taking: Reported on 06/02/2014   30 tablet   0   . oxyCODONE-acetaminophen (ROXICET) 5-325 MG per tablet   Oral   Take 1 tablet by mouth every 6 (six) hours as needed for severe pain.   20 tablet   0   . polyethylene glycol powder (GLYCOLAX/MIRALAX) powder      1 cap full in a full glass of water, once a day for 5 days.   255 g   0   . pramoxine (PROCTOFOAM) 1 % foam   Rectal   Place 1 application rectally 3 (three) times daily as needed for itching.   15 g   0   . senna (SENOKOT) 8.6 MG TABS tablet   Oral   Take 2 tablets (17.2 mg total) by mouth 2 (two) times daily.   120 each   0   . Witch Hazel (TUCKS) 50 % PADS   Apply externally   Apply 1 application topically every 6 (six) hours.   40 each   0     Allergies Sulfa antibiotics  No family history on file.  Social History History  Substance Use Topics  . Smoking status: Current Every Day Smoker -- 0.25 packs/day for 44 years    Types: Cigarettes  . Smokeless tobacco: Not on file  . Alcohol Use: Yes     Comment: 1 drink every 2-3 times/year    Review of Systems  Constitutional: No fever or chills. No weight changes Eyes:No blurry vision or double vision.  ENT: No sore throat. Cardiovascular: No chest pain. Respiratory: No dyspnea or cough. Gastrointestinal: Abdominal pain as above, no, vomiting and diarrhea.  Rectal drainage Genitourinary: Negative for dysuria, urinary retention, bloody urine, or difficulty urinating. Musculoskeletal: Negative for back pain. No joint swelling or pain. Skin: Negative for rash. Neurological: Negative for headaches, focal weakness or numbness. Psychiatric:No anxiety or depression.   Endocrine:No hot/cold intolerance, changes in energy, or sleep difficulty.  10-point ROS otherwise negative.  ____________________________________________   PHYSICAL EXAM:  VITAL SIGNS: ED Triage Vitals  Enc Vitals Group     BP 06/02/14 1411 135/75 mmHg     Pulse Rate 06/02/14 1411 78     Resp 06/02/14 1430 26     Temp 06/02/14 1411 98.7 F (37.1 C)     Temp src --      SpO2 06/02/14 1411 94 %     Weight 06/02/14 1411 184 lb (83.462 kg)     Height 06/02/14 1411 '5\' 8"'$  (1.727 m)     Head Cir --      Peak Flow --      Pain Score --      Pain Loc --      Pain Edu? --      Excl. in [place]? --      Constitutional: Alert and oriented. Well appearing and in no distress. Eyes: No scleral icterus. No conjunctival pallor. PERRL. EOMI ENT   Head: Normocephalic and atraumatic.   Nose: No congestion/rhinnorhea. No septal hematoma   Mouth/Throat: MMM, no pharyngeal erythema. No peritonsillar mass. No uvula shift.   Neck: No stridor. No SubQ emphysema. No meningismus. Hematological/Lymphatic/Immunilogical: No cervical lymphadenopathy. Cardiovascular: RRR. Normal and symmetric distal pulses are present in all extremities. No murmurs, rubs, or gallops. Respiratory: Normal respiratory effort without tachypnea nor retractions. Breath sounds are clear and equal bilaterally. No wheezes/rales/rhonchi. Gastrointestinal: Soft with colostomy site in left lower quadrant. Stoma is nontender and not prolapse. Deep to the stoma on deep palpation in the abdomen, there is palpable firm bowel that is tender with manipulation.. No distention. There is no CVA tenderness.  No rebound,  rigidity, or guarding.  Rectal exam performed with the RN, which reveals a fungating anal mass and then watery brown leakage which is Hemoccult positive. Genitourinary: deferred Musculoskeletal: Nontender with normal range of motion in all extremities. No joint effusions.  No lower extremity tenderness.  No edema. Neurologic:   Normal speech and language.  CN 2-10 normal. Motor grossly intact. No pronator drift.  Normal gait. No gross focal neurologic deficits are appreciated.  Skin:  Skin is warm, dry and intact. No rash noted.  No petechiae, purpura, or bullae. Psychiatric: Mood and affect are normal. Speech and behavior are normal. Patient exhibits appropriate insight and judgment.  ____________________________________________    LABS (pertinent positives/negatives) (all labs ordered are listed, but only abnormal results are displayed) Labs Reviewed - No data to display ____________________________________________   EKG    ____________________________________________    RADIOLOGY  Abdominal x-ray series unremarkable  ____________________________________________   PROCEDURES  ____________________________________________   INITIAL IMPRESSION / ASSESSMENT AND PLAN / ED COURSE  Pertinent labs & imaging results that were available during my care of the patient were reviewed by me and considered in my medical decision making (see chart for details).  Patient presentation suggestive of possible constipation at her ostomy site. Her history is not strong for obstruction. The Hemoccult positive drainage from her rectum is expected due to her recent surgery and known cancer mass. We'll check an x-ray to evaluate for complication, give her her oral pain medications as well as a laxative. ----------------------------------------- 6:26 PM  on 06/02/2014 -----------------------------------------  Surgery notes reviewed. X-ray reviewed. Abdomen remained soft vital signs remain  normal. Low suspicion for sepsis perforation or abscess or obstruction. We'll discharge patient home with refill of her pain medication as well as a bowel regimen consisting of senna Colace and MiraLAX to follow-up with her surgery team at her appointment on May 31. ____________________________________________   FINAL CLINICAL IMPRESSION(S) / ED DIAGNOSES  Final diagnoses:  Constipation, unspecified constipation type  Squamous cell cancer, anus      Carrie Mew, MD 06/02/14 1826

## 2014-06-02 NOTE — Discharge Instructions (Signed)

## 2014-06-02 NOTE — ED Notes (Signed)
BEHAVIORAL HEALTH ROUNDING Patient sleeping: Yes.   Patient alert and oriented: yes Behavior appropriate: Yes.  ; If no, describe:  Nutrition and fluids offered: Yes  Toileting and hygiene offered: Yes  Sitter present: yes Law enforcement present: Yes  

## 2014-06-08 ENCOUNTER — Other Ambulatory Visit: Payer: Self-pay | Admitting: *Deleted

## 2014-06-08 NOTE — Patient Outreach (Signed)
[facility]) Care Management  06/08/2014  [name] [birth year] [id number]   Phone call to patient to request that CSW accompany her to her next doctor's appointment to complete needs assessment.  Per patient, her phone is not working very well, not holding a charge for very long.  Patient stated that she has all of her supplies for her colostomy bag and her nurse Leafy Ro from [facility] will be ordering additional supplies to be sent to her home.  Per patient, she will be picking up her medicines on 06/09/14.  Per patient, she has an appointment with her primary care doctor on 06/12/14 at 8:45am and has agreed for this social worker to meet her either at that appointment or her appointment on 06/14/14 with Dr. Pat Patrick.  Patient uses [employer] to meet transportation needs to her doctor's appointments.  This social worker will meet patient at [place] appointment with her primary care doctor on 06/12/14 at 8:45am to complete needs assessment.  [name] [facility] Care Management [phone]

## 2014-06-12 ENCOUNTER — Other Ambulatory Visit: Payer: Self-pay | Admitting: *Deleted

## 2014-06-12 NOTE — Patient Outreach (Addendum)
[facility]) Care Management  [facility] Social Work  06/12/2014  [name] 09-Jun-1955 [id number]  Subjective:    Objective:   Current Medications:  Current Outpatient Prescriptions  Medication Sig Dispense Refill  . budesonide-formoterol (SYMBICORT) 160-4.5 MCG/ACT inhaler Inhale 2 puffs into the lungs 2 (two) times daily. 1 Inhaler 12  . clonazePAM (KLONOPIN) 1 MG tablet Take 1 tablet (1 mg total) by mouth 3 (three) times daily as needed (anxiety). 30 tablet 0  . docusate sodium (COLACE) 100 MG capsule Take 2 capsules (200 mg total) by mouth 2 (two) times daily. 120 capsule 0  . DULoxetine (CYMBALTA) 60 MG capsule Take 60 mg by mouth every evening.    . escitalopram (LEXAPRO) 20 MG tablet Take 20 mg by mouth at bedtime.    Marland Kitchen etodolac (LODINE) 500 MG tablet Take 500 mg by mouth 2 (two) times daily.    Marland Kitchen oxyCODONE-acetaminophen (PERCOCET/ROXICET) 5-325 MG per tablet Take 2 tablets by mouth every 6 (six) hours as needed for moderate pain or severe pain. 30 tablet 0  . polyethylene glycol powder (GLYCOLAX/MIRALAX) powder 1 cap full in a full glass of water, once a day for 5 days. 255 g 0  . pramoxine (PROCTOFOAM) 1 % foam Place 1 application rectally 3 (three) times daily as needed for itching. 15 g 0  . ranitidine (ZANTAC) 150 MG tablet Take 150 mg by mouth 2 (two) times daily.    Marland Kitchen senna (SENOKOT) 8.6 MG TABS tablet Take 2 tablets (17.2 mg total) by mouth 2 (two) times daily. 120 each 0  . simvastatin (ZOCOR) 40 MG tablet Take 40 mg by mouth every evening.     . tiotropium (SPIRIVA) 18 MCG inhalation capsule Place 18 mcg into inhaler and inhale every morning.     . zolpidem (AMBIEN) 5 MG tablet Take 5 mg by mouth at bedtime as needed for sleep.    Marland Kitchen albuterol (PROVENTIL) (2.5 MG/3ML) 0.083% nebulizer solution Inhale 3 mLs (2.5 mg total) into the lungs every 6 (six) hours as needed for wheezing or shortness of breath. (Patient not taking: Reported on 06/02/2014) 75 mL 12   . HYDROcodone-acetaminophen (NORCO/VICODIN) 5-325 MG per tablet Take 2 tablets by mouth every 6 (six) hours as needed for moderate pain. (Patient not taking: Reported on 06/02/2014) 30 tablet 0  . oxyCODONE-acetaminophen (ROXICET) 5-325 MG per tablet Take 1 tablet by mouth every 6 (six) hours as needed for severe pain. 20 tablet 0  . Witch Hazel (TUCKS) 50 % PADS Apply 1 application topically every 6 (six) hours. (Patient not taking: Reported on 06/12/2014) 40 each 0   No current facility-administered medications for this visit.    Functional Status:  In your present state of health, do you have any difficulty performing the following activities: 06/12/2014 05/17/2014  Hearing? - N  Vision? - N  Difficulty concentrating or making decisions? - N  Walking or climbing stairs? - Y  Dressing or bathing? - Y  Doing errands, shopping? - N  Conservation officer, nature and eating ? Y -  Using the Toilet? N -  In the past six months, have you accidently leaked urine? Y -  Do you have problems with loss of bowel control? N -  Managing your Medications? N -  Managing your Finances? N -  Housekeeping or managing your Housekeeping? Y -    Fall/Depression Screening:  PHQ 2/9 Scores 06/12/2014  PHQ - 2 Score 2    Assessment: CSW met patient at the  Doctor's office of Dr. Chancy Milroy.  Patient complained of being in pain, however stated that she could not get her pain medications refilled until she saw Dr. Pat Patrick on Wednesday. Patient has prescriptions from her hospital admission and MD office that need to be filled.  Per patient, she will get son in law to drop the prescription's off to get them filled. Patient described positive support at home, however reports feeling like a burden at times. Patient's daughter and son in law live across the hall, good friend lives there as well and has been taught by home health how to change her colostomy bag.  Per patient, there are a lot of negatives to living in the boarding home, however per  patient  "the positives outweigh the negatives".  Patient states that she will become eligible for public housing in approximately 1 year and will remain at current residence until that time.  Patient is not yet eligible for senior housing.  Per patient, she has most difficulty completing her ADL's.   Patient has Medicaid benefit, Personal Care Services discussed as possibility for consistent support .   Patient has approximately 1 week of colostomy supplies.   [employer] is the ordering company per patient, however she does don't know how to order colostomy supplies.   Plan: CSW to follow up on ostomy supply order and personal care services.        [facility] CM Care Plan Problem One        Patient Outreach from 06/12/2014 in [place] Problem One  assistance with ADL's   Care Plan for Problem One  Active   [facility] Long Term Goal (31-90 days)  Patient will have personal care assistance within 90 days   [facility] Long Term Goal Start Date  06/12/14   Interventions for Problem One Long Term Goal  Personal care request form faxed to patient's provider   [facility] CM Short Term Goal #1 (0-30 days)  patient will have additional colostomy supplies ordered within 2 weeks   [facility] CM Short Term Goal #1 Start Date  06/12/14   Interventions for Short Term Goal #1  obtained phone number for [facility] [phone] re-ordering proces discussed  [patient will need to call and re-order with doctor's full name and her insurance information]       [place], [place] Management [phone]

## 2014-06-14 ENCOUNTER — Encounter: Payer: Self-pay | Admitting: Surgery

## 2014-06-14 ENCOUNTER — Ambulatory Visit (INDEPENDENT_AMBULATORY_CARE_PROVIDER_SITE_OTHER): Payer: Commercial Managed Care - HMO | Admitting: Surgery

## 2014-06-14 ENCOUNTER — Telehealth: Payer: Self-pay

## 2014-06-14 VITALS — BP 153/82 | HR 114 | Temp 98.3°F | Ht 69.0 in | Wt 186.8 lb

## 2014-06-14 DIAGNOSIS — C210 Malignant neoplasm of anus, unspecified: Secondary | ICD-10-CM

## 2014-06-14 MED ORDER — OXYCODONE-ACETAMINOPHEN 5-325 MG PO TABS: 2.0000 | ORAL_TABLET | Freq: Four times a day (QID) | ORAL | Status: DC | PRN

## 2014-06-14 NOTE — Telephone Encounter (Addendum)
Please call [facility] to remind them about the right size for patient's colostomy (2' 1/4') bag and supplies.

## 2014-06-14 NOTE — Patient Instructions (Signed)
We will call [facility] to send out the correct colostomy bag Size: 2"1/4'. The appointment with [facility] Oncologist (Dr. Mike Gip) will be on June 16, 2014 at 3:30PM.

## 2014-06-14 NOTE — Telephone Encounter (Signed)
Spoke with [name] at this time and explained that appliance that has been ordered needs to be smaller size. Verbal orders given for 2 1/4" Ostomy appliance, barrier cream, stoma powder, and 4x4's to aid in Rectal drainage.  She will call back with any questions or concerns.

## 2014-06-14 NOTE — Progress Notes (Signed)
Outpatient Surgical Follow Up  06/14/2014  [name] is an 59 y.o. female.   Chief Complaint  Patient presents with  . Routine Post Op    colostomy done on 05/26/14 by Dr. Rexene Edison    HPI: She returns following her diverting colostomy for the recently diagnosed squamous cell carcinoma. She's doing well but has not had any follow-up in the cancer center as yet and needs to start on some sort of adjuvant therapy program. She is still miserable with significant rectal drainage and intermittent bleeding. Her pain level is increasing and I suspect will not improve until she starts him for therapeutic intervention.  Past Medical History  Diagnosis Date  . Schizophrenia   . Asthma   . GERD (gastroesophageal reflux disease)   . Anxiety   . Depression   . Bipolar disorder   . COPD (chronic obstructive pulmonary disease)   . Occasional tremors     right hand  . PTSD (post-traumatic stress disorder)   . Shortness of breath dyspnea   . Fibromyalgia   . DVT (deep venous thrombosis) 2011    RUE  . Thyroid nodule   . DDD (degenerative disc disease), lumbar   . Spinal stenosis   . Peripheral neuropathy   . Rotator cuff tear     right  . Pneumonia 2011  . Hypothyroidism     no meds currently  . Anemia     during pregnancy only  . Hypertension     Off meds x 15 years-well controlled now per pt  . Squamous cell cancer, anus     Past Surgical History  Procedure Laterality Date  . Foot surgery Right   . Tubal ligation    . Eye surgery Bilateral   . Mouth surgery  2002  . Rectal biopsy N/A 05/08/2014    Procedure: BIOPSY RECTAL;  Surgeon: Marlyce Huge, MD;  Location: [facility] ORS;  Service: General;  Laterality: N/A;  . Evaluation under anesthesia with hemorrhoidectomy N/A 05/08/2014    Procedure: EXAM UNDER ANESTHESIA WITH HEMORRHOIDECTOMY;  Surgeon: Marlyce Huge, MD;  Location: [facility] ORS;  Service: General;  Laterality: N/A;  . Portacath placement N/A 05/20/2014   Procedure: INSERTION PORT-A-CATH;  Surgeon: Florene Glen, MD;  Location: [facility] ORS;  Service: General;  Laterality: N/A;  . Laparoscopic diverted colostomy N/A 05/26/2014    Procedure: LAPAROSCOPIC DIVERTED COLOSTOMY;  Surgeon: Marlyce Huge, MD;  Location: [facility] ORS;  Service: General;  Laterality: N/A;    No family history on file.  Social History:  reports that she has been smoking Cigarettes.  She has a 11 pack-year smoking history. She does not have any smokeless tobacco history on file. She reports that she drinks alcohol. She reports that she does not use illicit drugs.  Allergies:  Allergies  Allergen Reactions  . Sulfa Antibiotics Hives    Medications reviewed.    ROS no change from her previous examination.    BP 153/82 mmHg  Pulse 114  Temp(Src) 98.3 F (36.8 C) (Oral)  Ht '5\' 9"'$  (1.753 m)  Wt 186 lb 12.8 oz (84.732 kg)  BMI 27.57 kg/m2  Physical Exam her rectum appears excoriated and she has a large rectal ulcer. Her abdomen looks good with no significant wound problems. The ostomy has some skin changes consistent with an ill fitting appliance. With her at length about improving the ostomy care.     No results found for this or any previous visit (from the past 48 hour(s)). No results found.  Assessment/Plan:  1. Squamous cell cancer, anus Overall she is doing reasonably well without having started any therapy. We have contacted cancer center and she has an appointment in 48 hours. With her port in place hopefully they can start intervention quickly. We'll plan to see her back in our office in 1 month.  - oxyCODONE-acetaminophen (PERCOCET/ROXICET) 5-325 MG per tablet; Take 2 tablets by mouth every 6 (six) hours as needed for moderate pain or severe pain.  Dispense: 60 tablet; Refill: 0 - Ambulatory referral to Radiation Oncology - Ambulatory referral to Oncology     Dia Crawford III  06/14/2014

## 2014-06-16 ENCOUNTER — Inpatient Hospital Stay: Payer: Commercial Managed Care - HMO | Attending: Hematology and Oncology | Admitting: Hematology and Oncology

## 2014-06-16 ENCOUNTER — Other Ambulatory Visit: Payer: Self-pay | Admitting: *Deleted

## 2014-06-16 ENCOUNTER — Encounter: Payer: Self-pay | Admitting: Hematology and Oncology

## 2014-06-16 VITALS — BP 143/84 | HR 109 | Temp 97.9°F | Ht 68.0 in | Wt 190.5 lb

## 2014-06-16 DIAGNOSIS — E041 Nontoxic single thyroid nodule: Secondary | ICD-10-CM | POA: Diagnosis not present

## 2014-06-16 DIAGNOSIS — K1370 Unspecified lesions of oral mucosa: Secondary | ICD-10-CM | POA: Insufficient documentation

## 2014-06-16 DIAGNOSIS — K219 Gastro-esophageal reflux disease without esophagitis: Secondary | ICD-10-CM | POA: Insufficient documentation

## 2014-06-16 DIAGNOSIS — R918 Other nonspecific abnormal finding of lung field: Secondary | ICD-10-CM | POA: Diagnosis not present

## 2014-06-16 DIAGNOSIS — J45909 Unspecified asthma, uncomplicated: Secondary | ICD-10-CM | POA: Diagnosis not present

## 2014-06-16 DIAGNOSIS — J449 Chronic obstructive pulmonary disease, unspecified: Secondary | ICD-10-CM | POA: Diagnosis not present

## 2014-06-16 DIAGNOSIS — F419 Anxiety disorder, unspecified: Secondary | ICD-10-CM | POA: Diagnosis not present

## 2014-06-16 DIAGNOSIS — F319 Bipolar disorder, unspecified: Secondary | ICD-10-CM | POA: Diagnosis not present

## 2014-06-16 DIAGNOSIS — I10 Essential (primary) hypertension: Secondary | ICD-10-CM | POA: Diagnosis not present

## 2014-06-16 DIAGNOSIS — Z86718 Personal history of other venous thrombosis and embolism: Secondary | ICD-10-CM | POA: Diagnosis not present

## 2014-06-16 DIAGNOSIS — F209 Schizophrenia, unspecified: Secondary | ICD-10-CM

## 2014-06-16 DIAGNOSIS — R634 Abnormal weight loss: Secondary | ICD-10-CM | POA: Diagnosis not present

## 2014-06-16 DIAGNOSIS — M5136 Other intervertebral disc degeneration, lumbar region: Secondary | ICD-10-CM | POA: Diagnosis not present

## 2014-06-16 DIAGNOSIS — E039 Hypothyroidism, unspecified: Secondary | ICD-10-CM | POA: Diagnosis not present

## 2014-06-16 DIAGNOSIS — G629 Polyneuropathy, unspecified: Secondary | ICD-10-CM | POA: Insufficient documentation

## 2014-06-16 DIAGNOSIS — Z5111 Encounter for antineoplastic chemotherapy: Secondary | ICD-10-CM | POA: Diagnosis present

## 2014-06-16 DIAGNOSIS — F4310 Post-traumatic stress disorder, unspecified: Secondary | ICD-10-CM | POA: Insufficient documentation

## 2014-06-16 DIAGNOSIS — Z933 Colostomy status: Secondary | ICD-10-CM

## 2014-06-16 DIAGNOSIS — F17210 Nicotine dependence, cigarettes, uncomplicated: Secondary | ICD-10-CM | POA: Insufficient documentation

## 2014-06-16 DIAGNOSIS — Z79899 Other long term (current) drug therapy: Secondary | ICD-10-CM | POA: Diagnosis not present

## 2014-06-16 DIAGNOSIS — C210 Malignant neoplasm of anus, unspecified: Secondary | ICD-10-CM | POA: Insufficient documentation

## 2014-06-16 DIAGNOSIS — M797 Fibromyalgia: Secondary | ICD-10-CM | POA: Insufficient documentation

## 2014-06-16 NOTE — Patient Outreach (Signed)
[facility]) Care Management  06/16/2014  [name] October 12, 1955 [id number]   Phone call from patient's provider office stating that they could not complete request  For independent assessment for personal care services due to the fact that she is new to their office.  Patient's initially visit was 06/12/14.    [name] [facility] Care Management [phone]

## 2014-06-16 NOTE — Progress Notes (Signed)
Pt here today for follow up from hospital from 5/11-5/24; referred by Dr. Pat Patrick after being diagnosed with rectal cancer

## 2014-06-16 NOTE — Progress Notes (Signed)
[facility] day:  06/16/2014  Chief Complaint: [name] is an 59 y.o. female with squamous cell carcinoma of the anus who is seen for assessment after recent hospitalization.  HPI:  The patient has a history of hemorrhoid problems for several months. She used several topical preparations without improvement. She saw Dr Rexene Edison who discussed possible hemorroidectomy. She was lost to follow-up. Smptoms progressed and included rawness, bleeding, and stool leakage for 2 months. She describes a sensation of "burning like a torch". Bowel movements became liquid and constant (liquid seepage every 5 minutes and going through 3 rolls of toilet paper a day).  She sought re-evaluation by Dr. Rexene Edison. He noted a large non-healing right anal ulcer. On 05/08/2014, she underwent exam under anesthesia and biopsy of the right sided anal mass. The mass encompassed 50% of her right anus and extended from the opening to approximately 10 cm internal. It was firm, fixed and had significant necrosis. 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 Tumor invaded into the muscularis propria and involved the perianal skin.  During this time, she lost 80 pounds in 6 months.   She was admitted to [facility] on 05/17/2014 with severe pain.  Chest, abdomen, and pelvic CT scan on 05/19/2014 revealed no clear evidence of metastatic disease.  She had a 4 mm right upper lobe pulmonary nodule.  There were borderline enlarged iliac nodes (1.3 cm).  There was a 4.7 x 4.1 x 6 cm low rectal and anal mass.   HIV testing was negative on 05/19/2014. She underwent port-a-cath placement on 05/20/2014 in anticipation of chemotherapy.  She underwent laparoscopic diverting colostomy on 05/26/2014 for fecal incontinence.  She was discharged on 05/30/2014.  Since discharge, she notes ongoing leakage from the rectum  although dramatically improved. She describes having to change her pants and underwear 5 times a day. She is using lidocaine gel as well as pain medications. She has a pressure sensation in her rectum which is aggravated by walking. She has gained 6 pounds.  Past Medical History  Diagnosis Date  . Schizophrenia   . Asthma   . GERD (gastroesophageal reflux disease)   . Anxiety   . Depression   . Bipolar disorder   . COPD (chronic obstructive pulmonary disease)   . Occasional tremors     right hand  . PTSD (post-traumatic stress disorder)   . Shortness of breath dyspnea   . Fibromyalgia   . DVT (deep venous thrombosis) 2011    RUE  . Thyroid nodule   . DDD (degenerative disc disease), lumbar   . Spinal stenosis   . Peripheral neuropathy   . Rotator cuff tear     right  . Pneumonia 2011  . Hypothyroidism     no meds currently  . Anemia     during pregnancy only  . Hypertension     Off meds x 15 years-well controlled now per pt  . Squamous cell cancer, anus     Past Surgical History  Procedure Laterality Date  . Foot surgery Right   . Tubal ligation    . Eye surgery Bilateral   . Mouth surgery  2002  . Rectal biopsy N/A 05/08/2014    Procedure: BIOPSY RECTAL;  Surgeon: Marlyce Huge, MD;  Location: [facility] ORS;  Service: General;  Laterality: N/A;  . Evaluation under anesthesia with hemorrhoidectomy N/A 05/08/2014    Procedure: EXAM UNDER ANESTHESIA WITH HEMORRHOIDECTOMY;  Surgeon:  Marlyce Huge, MD;  Location: [facility] ORS;  Service: General;  Laterality: N/A;  . Portacath placement N/A 05/20/2014    Procedure: INSERTION PORT-A-CATH;  Surgeon: Florene Glen, MD;  Location: [facility] ORS;  Service: General;  Laterality: N/A;  . Laparoscopic diverted colostomy N/A 05/26/2014    Procedure: LAPAROSCOPIC DIVERTED COLOSTOMY;  Surgeon: Marlyce Huge, MD;  Location: [facility] ORS;  Service: General;  Laterality: N/A;    Family History  Problem Relation Age of Onset  .  Cancer Maternal Aunt   . Cancer Paternal Uncle     Social History:  reports that she has been smoking Cigarettes.  She has a 11 pack-year smoking history. She has never used smokeless tobacco. She reports that she drinks alcohol. She reports that she does not use illicit drugs.  The patient is alone today.  Allergies:  Allergies  Allergen Reactions  . Sulfa Antibiotics Hives    Current Medications: Current Outpatient Prescriptions  Medication Sig Dispense Refill  . albuterol (PROVENTIL) (2.5 MG/3ML) 0.083% nebulizer solution Inhale 3 mLs (2.5 mg total) into the lungs every 6 (six) hours as needed for wheezing or shortness of breath. 75 mL 12  . budesonide-formoterol (SYMBICORT) 160-4.5 MCG/ACT inhaler Inhale 2 puffs into the lungs 2 (two) times daily. 1 Inhaler 12  . clonazePAM (KLONOPIN) 1 MG tablet Take 1 tablet (1 mg total) by mouth 3 (three) times daily as needed (anxiety). 30 tablet 0  . docusate sodium (COLACE) 100 MG capsule Take 2 capsules (200 mg total) by mouth 2 (two) times daily. 120 capsule 0  . DULoxetine (CYMBALTA) 60 MG capsule Take 60 mg by mouth every evening.    . escitalopram (LEXAPRO) 20 MG tablet Take 20 mg by mouth at bedtime.    Marland Kitchen etodolac (LODINE) 500 MG tablet Take 500 mg by mouth 2 (two) times daily.    Marland Kitchen oxyCODONE-acetaminophen (PERCOCET/ROXICET) 5-325 MG per tablet Take 2 tablets by mouth every 6 (six) hours as needed for moderate pain or severe pain. 60 tablet 0  . polyethylene glycol powder (GLYCOLAX/MIRALAX) powder 1 cap full in a full glass of water, once a day for 5 days. 255 g 0  . pramoxine (PROCTOFOAM) 1 % foam Place 1 application rectally 3 (three) times daily as needed for itching. 15 g 0  . ranitidine (ZANTAC) 150 MG tablet Take 150 mg by mouth 2 (two) times daily.    Marland Kitchen senna (SENOKOT) 8.6 MG TABS tablet Take 2 tablets (17.2 mg total) by mouth 2 (two) times daily. 120 each 0  . simvastatin (ZOCOR) 40 MG tablet Take 40 mg by mouth every evening.      . tiotropium (SPIRIVA) 18 MCG inhalation capsule Place 18 mcg into inhaler and inhale every morning.     . traMADol (ULTRAM) 50 MG tablet Take 50 mg by mouth every 6 (six) hours as needed.    Addison Lank Hazel (TUCKS) 50 % PADS Apply 1 application topically every 6 (six) hours. 40 each 0  . zolpidem (AMBIEN) 5 MG tablet Take 5 mg by mouth at bedtime as needed for sleep.     No current facility-administered medications for this visit.    Review of Systems:  GENERAL:  Uncomfortable.  Stays at home secondary to leakage from anus.  No fevers or sweats.  Initial weight loss of 80 pounds now with weight gain of 6 pounds. PERFORMANCE STATUS (ECOG):  2 HEENT:  No visual changes, runny nose, sore throat, mouth sores or tenderness. Lungs: No shortness of  breath or cough.  No hemoptysis. Cardiac:  No chest pain, palpitations, orthopnea, or PND. GI:  Colostomy.  Leakage from anus.  No nausea, vomiting, diarrhea, constipation, melena or hematochezia. GU:  No urgency, frequency, dysuria, or hematuria. Musculoskeletal:  No back pain.  No joint pain.  No muscle tenderness. Extremities:  No pain or swelling. Skin:  No rashes or skin changes. Neuro:  No headache, numbness or weakness, balance or coordination issues. Endocrine:  No diabetes, thyroid issues, hot flashes or night sweats. Psych:  No mood changes, depression or anxiety. Pain:  Pain associated from anal mass. Review of systems:  All other systems reviewed and found to be negative.   Physical Exam: Blood pressure 143/84, pulse 109, temperature 97.9 F (36.6 C), temperature source Tympanic, height 5' 8"  (1.727 m), weight 190 lb 7.6 oz (86.4 kg).  GENERAL:  Chronically ill appearing woman sitting uncomfortably in the exam room in no acute distress.  Room has a foul odor. MENTAL STATUS:  Alert and oriented to person, place and time. HEAD:  Pearline Cables hair.  Normocephalic, atraumatic, face symmetric, no Cushingoid features. EYES:  Blue eyes.  Pupils equal  round and reactive to light and accomodation.  No conjunctivitis or scleral icterus. ENT:  Oropharynx clear without lesion.  Tongue normal. Mucous membranes moist.  RESPIRATORY:  Clear to auscultation without rales, wheezes or rhonchi. CARDIOVASCULAR:  Regular rate and rhythm without murmur, rub or gallop. ABDOMEN:  Soft, non-tender, with active bowel sounds, and no hepatosplenomegaly.  No masses. RECTUM:  4 cm right sided cauliflower anal mass with leakage of brown foul smelling material.  SKIN:  No rashes, ulcers or lesions. EXTREMITIES: No edema, no skin discoloration or tenderness.  No palpable cords. LYMPH NODES: No palpable cervical, supraclavicular, axillary or inguinal adenopathy  NEUROLOGICAL: Unremarkable. PSYCH:  Appropriate.   No visits with results within 3 Day(s) from this visit. Latest known visit with results is:  Admission on 05/17/2014, Discharged on 05/30/2014  Component Date Value Ref Range Status  . WBC 05/17/2014 14.7* 3.6 - 11.0 K/uL Final  . RBC 05/17/2014 4.64  3.80 - 5.20 MIL/uL Final  . Hemoglobin 05/17/2014 13.8  12.0 - 16.0 g/dL Final  . HCT 05/17/2014 41.8  35.0 - 47.0 % Final  . MCV 05/17/2014 90.1  80.0 - 100.0 fL Final  . MCH 05/17/2014 29.8  26.0 - 34.0 pg Final  . MCHC 05/17/2014 33.0  32.0 - 36.0 g/dL Final  . RDW 05/17/2014 14.9* 11.5 - 14.5 % Final  . Platelets 05/17/2014 280  150 - 440 K/uL Final  . Sodium 05/17/2014 139  135 - 145 mmol/L Final  . Potassium 05/17/2014 3.1* 3.5 - 5.1 mmol/L Final  . Chloride 05/17/2014 104  101 - 111 mmol/L Final  . CO2 05/17/2014 27  22 - 32 mmol/L Final  . Glucose, Bld 05/17/2014 98  65 - 99 mg/dL Final  . BUN 05/17/2014 8  6 - 20 mg/dL Final  . Creatinine, Ser 05/17/2014 0.71  0.44 - 1.00 mg/dL Final  . Calcium 05/17/2014 8.3* 8.9 - 10.3 mg/dL Final  . GFR calc non Af Amer 05/17/2014 >60  >60 mL/min Final  . GFR calc Af Amer 05/17/2014 >60  >60 mL/min Final   Comment: (NOTE) The eGFR has been calculated  using the CKD EPI equation. This calculation has not been validated in all clinical situations. eGFR's persistently <60 mL/min signify possible Chronic Kidney Disease.   . Anion gap 05/17/2014 8  5 - 15 Final  .  Sodium 05/19/2014 139  135 - 145 mmol/L Final  . Potassium 05/19/2014 3.6  3.5 - 5.1 mmol/L Final  . Chloride 05/19/2014 100* 101 - 111 mmol/L Final  . CO2 05/19/2014 30  22 - 32 mmol/L Final  . Glucose, Bld 05/19/2014 89  65 - 99 mg/dL Final  . BUN 05/19/2014 5* 6 - 20 mg/dL Final  . Creatinine, Ser 05/19/2014 0.73  0.44 - 1.00 mg/dL Final  . Calcium 05/19/2014 8.7* 8.9 - 10.3 mg/dL Final  . GFR calc non Af Amer 05/19/2014 >60  >60 mL/min Final  . GFR calc Af Amer 05/19/2014 >60  >60 mL/min Final   Comment: (NOTE) The eGFR has been calculated using the CKD EPI equation. This calculation has not been validated in all clinical situations. eGFR's persistently <60 mL/min signify possible Chronic Kidney Disease.   . Anion gap 05/19/2014 9  5 - 15 Final  . HIV Screen 4th Generation wRfx 05/19/2014 Non Reactive  Non Reactive Final   Comment: (NOTE) Performed At: [facility] [place], [place] [id number] Lindon Romp MD [id number] Performed at [facility]   . WBC 05/20/2014 13.7* 3.6 - 11.0 K/uL Final  . RBC 05/20/2014 4.31  3.80 - 5.20 MIL/uL Final  . Hemoglobin 05/20/2014 12.8  12.0 - 16.0 g/dL Final  . HCT 05/20/2014 39.4  35.0 - 47.0 % Final  . MCV 05/20/2014 91.6  80.0 - 100.0 fL Final  . MCH 05/20/2014 29.8  26.0 - 34.0 pg Final  . MCHC 05/20/2014 32.6  32.0 - 36.0 g/dL Final  . RDW 05/20/2014 14.7* 11.5 - 14.5 % Final  . Platelets 05/20/2014 258  150 - 440 K/uL Final  . Neutrophils Relative % 05/20/2014 65   Final  . Neutro Abs 05/20/2014 9.1* 1.4 - 6.5 K/uL Final  . Lymphocytes Relative 05/20/2014 21   Final  . Lymphs Abs 05/20/2014 2.9  1.0 - 3.6 K/uL Final  . Monocytes Relative 05/20/2014 7   Final  . Monocytes Absolute  05/20/2014 0.9  0.2 - 0.9 K/uL Final  . Eosinophils Relative 05/20/2014 6   Final  . Eosinophils Absolute 05/20/2014 0.8* 0 - 0.7 K/uL Final  . Basophils Relative 05/20/2014 1   Final  . Basophils Absolute 05/20/2014 0.1  0 - 0.1 K/uL Final  . Sodium 05/20/2014 136  135 - 145 mmol/L Final  . Potassium 05/20/2014 4.1  3.5 - 5.1 mmol/L Final  . Chloride 05/20/2014 101  101 - 111 mmol/L Final  . CO2 05/20/2014 30  22 - 32 mmol/L Final  . Glucose, Bld 05/20/2014 94  65 - 99 mg/dL Final  . BUN 05/20/2014 6  6 - 20 mg/dL Final  . Creatinine, Ser 05/20/2014 0.67  0.44 - 1.00 mg/dL Final  . Calcium 05/20/2014 8.5* 8.9 - 10.3 mg/dL Final  . GFR calc non Af Amer 05/20/2014 >60  >60 mL/min Final  . GFR calc Af Amer 05/20/2014 >60  >60 mL/min Final   Comment: (NOTE) The eGFR has been calculated using the CKD EPI equation. This calculation has not been validated in all clinical situations. eGFR's persistently <60 mL/min signify possible Chronic Kidney Disease.   . Anion gap 05/20/2014 5  5 - 15 Final  . WBC 05/23/2014 14.1* 3.6 - 11.0 K/uL Final  . RBC 05/23/2014 4.36  3.80 - 5.20 MIL/uL Final  . Hemoglobin 05/23/2014 13.1  12.0 - 16.0 g/dL Final  . HCT 05/23/2014 40.3  35.0 - 47.0 % Final  .  MCV 05/23/2014 92.4  80.0 - 100.0 fL Final  . MCH 05/23/2014 30.1  26.0 - 34.0 pg Final  . MCHC 05/23/2014 32.6  32.0 - 36.0 g/dL Final  . RDW 05/23/2014 15.0* 11.5 - 14.5 % Final  . Platelets 05/23/2014 270  150 - 440 K/uL Final  . Sodium 05/23/2014 137  135 - 145 mmol/L Final  . Potassium 05/23/2014 4.3  3.5 - 5.1 mmol/L Final  . Chloride 05/23/2014 105  101 - 111 mmol/L Final  . CO2 05/23/2014 27  22 - 32 mmol/L Final  . Glucose, Bld 05/23/2014 90  65 - 99 mg/dL Final  . BUN 05/23/2014 13  6 - 20 mg/dL Final  . Creatinine, Ser 05/23/2014 0.73  0.44 - 1.00 mg/dL Final  . Calcium 05/23/2014 8.8* 8.9 - 10.3 mg/dL Final  . GFR calc non Af Amer 05/23/2014 >60  >60 mL/min Final  . GFR calc Af Amer  05/23/2014 >60  >60 mL/min Final   Comment: (NOTE) The eGFR has been calculated using the CKD EPI equation. This calculation has not been validated in all clinical situations. eGFR's persistently <60 mL/min signify possible Chronic Kidney Disease.   . Anion gap 05/23/2014 5  5 - 15 Final  . WBC 05/24/2014 12.2* 3.6 - 11.0 K/uL Final  . RBC 05/24/2014 3.99  3.80 - 5.20 MIL/uL Final  . Hemoglobin 05/24/2014 12.0  12.0 - 16.0 g/dL Final  . HCT 05/24/2014 36.5  35.0 - 47.0 % Final  . MCV 05/24/2014 91.4  80.0 - 100.0 fL Final  . MCH 05/24/2014 30.1  26.0 - 34.0 pg Final  . MCHC 05/24/2014 32.9  32.0 - 36.0 g/dL Final  . RDW 05/24/2014 15.1* 11.5 - 14.5 % Final  . Platelets 05/24/2014 231  150 - 440 K/uL Final  . Sodium 05/24/2014 140  135 - 145 mmol/L Final  . Potassium 05/24/2014 5.1  3.5 - 5.1 mmol/L Final  . Chloride 05/24/2014 105  101 - 111 mmol/L Final  . CO2 05/24/2014 29  22 - 32 mmol/L Final  . Glucose, Bld 05/24/2014 97  65 - 99 mg/dL Final  . BUN 05/24/2014 18  6 - 20 mg/dL Final  . Creatinine, Ser 05/24/2014 0.85  0.44 - 1.00 mg/dL Final  . Calcium 05/24/2014 8.8* 8.9 - 10.3 mg/dL Final  . GFR calc non Af Amer 05/24/2014 >60  >60 mL/min Final  . GFR calc Af Amer 05/24/2014 >60  >60 mL/min Final   Comment: (NOTE) The eGFR has been calculated using the CKD EPI equation. This calculation has not been validated in all clinical situations. eGFR's persistently <60 mL/min signify possible Chronic Kidney Disease.   . Anion gap 05/24/2014 6  5 - 15 Final  . Platelets 05/26/2014 208  150 - 440 K/uL Final  . Platelets 05/29/2014 216  150 - 440 K/uL Final    Assessment:  BIDDIE SEBEK is an 59 y.o. 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  Chest, abdomen, and pelvic CT scan on 05/19/2014 revealed no evidence of metastatic  disease.  There was a 4 mm RUL pulmonary nodule.  There were borderline (13 mm) enlarged iliac nodes.  There was a 4.7 x 4.1 x 6.0 cm lower rectal and anal mass consistent with anal cancer.  She underwent diverting colostomy on 05/26/2014.  She continues to have anal  leakage.  She has gained 6 pounds.  Exam reveals a 4 cm right sided cauliflower anal mass.  Plan: 1.  Discuss concurrent chemotherapy and radiation. Chemotherapy will consist of 5-fluorouracil and mitomycin C for 2 cycles given 1 month apart. Side effects of chemotherapy were reviewed including myelosuppression, nausea and vomiting, mucositis, diarrhea, hand-foot syndrome and a small risk of the TTP with mitomycin-C. I discussed attending chemotherapy class. Treatment will be initiated soon given her extensive disease. She will be seen by radiation oncology.  Several questions were asked and answered. 2.  Information for patient on 5FU and mitomycin C. 3.  Preauthorize 5FU 1000 mg/m2 IV via continuous infusion over 24 hours on days 1-4 and day 29-32 with mitomycin C 10 mg/m2 on days 1 and 29. 4.  Schedule chemotherapy class.   5.  Radiation oncology consult. 6.  Coordinate care with radiation oncology. 7.  RTC for MD assessment, labs (CBC with diff, CMP, Mg) and cycle #1 5FU and mitomycin C.   Lequita Asal, MD  06/16/2014, 3:51 PM

## 2014-06-20 ENCOUNTER — Other Ambulatory Visit: Payer: Self-pay | Admitting: *Deleted

## 2014-06-20 ENCOUNTER — Encounter: Payer: Self-pay | Admitting: Radiation Oncology

## 2014-06-20 ENCOUNTER — Ambulatory Visit
Admission: RE | Admit: 2014-06-20 | Discharge: 2014-06-20 | Disposition: A | Discharge status: Home / Self Care | Payer: Commercial Managed Care - HMO | Source: Ambulatory Visit | Attending: Radiation Oncology | Admitting: Radiation Oncology

## 2014-06-20 VITALS — BP 102/52 | HR 65 | Temp 98.9°F | Wt 189.4 lb

## 2014-06-20 DIAGNOSIS — Z510 Encounter for antineoplastic radiation therapy: Secondary | ICD-10-CM | POA: Insufficient documentation

## 2014-06-20 DIAGNOSIS — C210 Malignant neoplasm of anus, unspecified: Secondary | ICD-10-CM | POA: Insufficient documentation

## 2014-06-20 NOTE — Patient Instructions (Signed)
Fluorouracil, 5-FU injection What is this medicine? FLUOROURACIL, 5-FU (flure oh YOOR a sil) is a chemotherapy drug. It slows the growth of cancer cells. This medicine is used to treat many types of cancer like breast cancer, colon or rectal cancer, pancreatic cancer, and stomach cancer. This medicine may be used for other purposes; ask your health care provider or pharmacist if you have questions. COMMON BRAND NAME(S): Adrucil What should I tell my health care provider before I take this medicine? They need to know if you have any of these conditions: -blood disorders -dihydropyrimidine dehydrogenase (DPD) deficiency -infection (especially a virus infection such as chickenpox, cold sores, or herpes) -kidney disease -liver disease -malnourished, poor nutrition -recent or ongoing radiation therapy -an unusual or allergic reaction to fluorouracil, other chemotherapy, other medicines, foods, dyes, or preservatives -pregnant or trying to get pregnant -breast-feeding How should I use this medicine? This drug is given as an infusion or injection into a vein. It is administered in a hospital or clinic by a specially trained health care professional. Talk to your pediatrician regarding the use of this medicine in children. Special care may be needed. Overdosage: If you think you have taken too much of this medicine contact a poison control center or emergency room at once. NOTE: This medicine is only for you. Do not share this medicine with others. What if I miss a dose? It is important not to miss your dose. Call your doctor or health care professional if you are unable to keep an appointment. What may interact with this medicine? -allopurinol -cimetidine -dapsone -digoxin -hydroxyurea -leucovorin -levamisole -medicines for seizures like ethotoin, fosphenytoin, phenytoin -medicines to increase blood counts like filgrastim, pegfilgrastim, sargramostim -medicines that treat or prevent blood  clots like warfarin, enoxaparin, and dalteparin -methotrexate -metronidazole -pyrimethamine -some other chemotherapy drugs like busulfan, cisplatin, estramustine, vinblastine -trimethoprim -trimetrexate -vaccines Talk to your doctor or health care professional before taking any of these medicines: -acetaminophen -aspirin -ibuprofen -ketoprofen -naproxen This list may not describe all possible interactions. Give your health care provider a list of all the medicines, herbs, non-prescription drugs, or dietary supplements you use. Also tell them if you smoke, drink alcohol, or use illegal drugs. Some items may interact with your medicine. What should I watch for while using this medicine? Visit your doctor for checks on your progress. This drug may make you feel generally unwell. This is not uncommon, as chemotherapy can affect healthy cells as well as cancer cells. Report any side effects. Continue your course of treatment even though you feel ill unless your doctor tells you to stop. In some cases, you may be given additional medicines to help with side effects. Follow all directions for their use. Call your doctor or health care professional for advice if you get a fever, chills or sore throat, or other symptoms of a cold or flu. Do not treat yourself. This drug decreases your body's ability to fight infections. Try to avoid being around people who are sick. This medicine may increase your risk to bruise or bleed. Call your doctor or health care professional if you notice any unusual bleeding. Be careful brushing and flossing your teeth or using a toothpick because you may get an infection or bleed more easily. If you have any dental work done, tell your dentist you are receiving this medicine. Avoid taking products that contain aspirin, acetaminophen, ibuprofen, naproxen, or ketoprofen unless instructed by your doctor. These medicines may hide a fever. Do not become pregnant while taking this  medicine. Women should inform their doctor if they wish to become pregnant or think they might be pregnant. There is a potential for serious side effects to an unborn child. Talk to your health care professional or pharmacist for more information. Do not breast-feed an infant while taking this medicine. Men should inform their doctor if they wish to father a child. This medicine may lower sperm counts. Do not treat diarrhea with over the counter products. Contact your doctor if you have diarrhea that lasts more than 2 days or if it is severe and watery. This medicine can make you more sensitive to the sun. Keep out of the sun. If you cannot avoid being in the sun, wear protective clothing and use sunscreen. Do not use sun lamps or tanning beds/booths. What side effects may I notice from receiving this medicine? Side effects that you should report to your doctor or health care professional as soon as possible: -allergic reactions like skin rash, itching or hives, swelling of the face, lips, or tongue -low blood counts - this medicine may decrease the number of white blood cells, red blood cells and platelets. You may be at increased risk for infections and bleeding. -signs of infection - fever or chills, cough, sore throat, pain or difficulty passing urine -signs of decreased platelets or bleeding - bruising, pinpoint red spots on the skin, black, tarry stools, blood in the urine -signs of decreased red blood cells - unusually weak or tired, fainting spells, lightheadedness -breathing problems -changes in vision -chest pain -mouth sores -nausea and vomiting -pain, swelling, redness at site where injected -pain, tingling, numbness in the hands or feet -redness, swelling, or sores on hands or feet -stomach pain -unusual bleeding Side effects that usually do not require medical attention (report to your doctor or health care professional if they continue or are bothersome): -changes in finger or  toe nails -diarrhea -dry or itchy skin -hair loss -headache -loss of appetite -sensitivity of eyes to the light -stomach upset -unusually teary eyes This list may not describe all possible side effects. Call your doctor for medical advice about side effects. You may report side effects to FDA at 1-800-FDA-1088. Where should I keep my medicine? This drug is given in a hospital or clinic and will not be stored at home. NOTE: This sheet is a summary. It may not cover all possible information. If you have questions about this medicine, talk to your doctor, pharmacist, or health care provider.  2015, Elsevier/Gold Standard. (2007-04-28 13:53:16) Mitomycin injection What is this medicine? MITOMYCIN (mye toe MYE sin) is a chemotherapy drug. This medicine is used to treat cancer of the stomach and pancreas. This medicine may be used for other purposes; ask your health care provider or pharmacist if you have questions. COMMON BRAND NAME(S): Mutamycin What should I tell my health care provider before I take this medicine? They need to know if you have any of these conditions: -anemia -bleeding disorder -infection (especially a virus infection such as chickenpox, cold sores, or herpes) -kidney disease -low blood counts like low platelets, red blood cells, white blood cells -recent radiation therapy -an unusual or allergic reaction to mitomycin, other chemotherapy agents, other medicines, foods, dyes, or preservatives -pregnant or trying to get pregnant -breast-feeding How should I use this medicine? This drug is given as an injection or infusion into a vein. It is administered in a hospital or clinic by a specially trained health care professional. Talk to your pediatrician regarding the use of  this medicine in children. Special care may be needed. Overdosage: If you think you have taken too much of this medicine contact a poison control center or emergency room at once. NOTE: This medicine  is only for you. Do not share this medicine with others. What if I miss a dose? It is important not to miss your dose. Call your doctor or health care professional if you are unable to keep an appointment. What may interact with this medicine? -medicines to increase blood counts like filgrastim, pegfilgrastim, sargramostim -vaccines This list may not describe all possible interactions. Give your health care provider a list of all the medicines, herbs, non-prescription drugs, or dietary supplements you use. Also tell them if you smoke, drink alcohol, or use illegal drugs. Some items may interact with your medicine. What should I watch for while using this medicine? Your condition will be monitored carefully while you are receiving this medicine. You will need important blood work done while you are taking this medicine. This drug may make you feel generally unwell. This is not uncommon, as chemotherapy can affect healthy cells as well as cancer cells. Report any side effects. Continue your course of treatment even though you feel ill unless your doctor tells you to stop. Call your doctor or health care professional for advice if you get a fever, chills or sore throat, or other symptoms of a cold or flu. Do not treat yourself. This drug decreases your body's ability to fight infections. Try to avoid being around people who are sick. This medicine may increase your risk to bruise or bleed. Call your doctor or health care professional if you notice any unusual bleeding. Be careful brushing and flossing your teeth or using a toothpick because you may get an infection or bleed more easily. If you have any dental work done, tell your dentist you are receiving this medicine. Avoid taking products that contain aspirin, acetaminophen, ibuprofen, naproxen, or ketoprofen unless instructed by your doctor. These medicines may hide a fever. Do not become pregnant while taking this medicine. Women should inform their  doctor if they wish to become pregnant or think they might be pregnant. There is a potential for serious side effects to an unborn child. Talk to your health care professional or pharmacist for more information. Do not breast-feed an infant while taking this medicine. What side effects may I notice from receiving this medicine? Side effects that you should report to your doctor or health care professional as soon as possible: -allergic reactions like skin rash, itching or hives, swelling of the face, lips, or tongue -low blood counts - this medicine may decrease the number of white blood cells, red blood cells and platelets. You may be at increased risk for infections and bleeding. -signs of infection - fever or chills, cough, sore throat, pain or difficulty passing urine -signs of decreased platelets or bleeding - bruising, pinpoint red spots on the skin, black, tarry stools, blood in the urine -signs of decreased red blood cells - unusually weak or tired, fainting spells, lightheadedness -breathing problems -changes in vision -chest pain -confusion -dry cough -high blood pressure -mouth sores -pain, swelling, redness at site where injected -pain, tingling, numbness in the hands or feet -seizures -swelling of the ankles, feet, hands -trouble passing urine or change in the amount of urine Side effects that usually do not require medical attention (report to your doctor or health care professional if they continue or are bothersome): -diarrhea -green to blue color of urine -  hair loss -loss of appetite -nausea, vomiting This list may not describe all possible side effects. Call your doctor for medical advice about side effects. You may report side effects to FDA at 1-800-FDA-1088. Where should I keep my medicine? This drug is given in a hospital or clinic and will not be stored at home. NOTE: This sheet is a summary. It may not cover all possible information. If you have questions about  this medicine, talk to your doctor, pharmacist, or health care provider.  2015, Elsevier/Gold Standard. (2007-07-01 11:16:23)

## 2014-06-20 NOTE — Progress Notes (Signed)
Oncology Nurse Navigator Documentation  Oncology Nurse Navigator Flowsheets 06/16/2014 06/20/2014  Navigator Encounter Type Initial MedOnc Initial RadOnc  Patient Visit Type Medonc Radonc  Manufacturing engineer;Financial  Specialty Items/DME - Ostomoy supplies  Time Spent with Patient 30 30   Pt living in boarding house with no air conditioning. Provided with a box fan. Reports has no further ostomy supplies at home and is on her last bag. Report home health has sent her supplies but she has not received any since she was discharged from hospital. Provided with 21/2" one piece  Appliances and paste. Pt able to demonstrate proper care of ostomy and applying appliance. Unable to afford donut pillow to relieve pain from excoriation. Referral has been placed for pt care services

## 2014-06-20 NOTE — Consult Note (Signed)
Radiation Oncology NEW PATIENT EVALUATION  Name: [name]  MRN: [id number]  Date:   06/20/2014     DOB: [birth year]   This 59 y.o. 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  REFERRING PHYSICIAN: Lavera Guise, MD  CHIEF COMPLAINT:  Chief Complaint  Patient presents with  . Cancer    Referring Dr. Everett Graff cancer    DIAGNOSIS: The encounter diagnosis was Squamous cell cancer, anus.   PREVIOUS INVESTIGATIONS:  CT scans reviewed Surgical pathology report reviewed Clinical notes reviewed  HPI: Patient is a 59 year old female with multiple comorbidities including schizophrenia bipolar disorder depression who noted a history of hemorrhoids for at least 6 months. She been lost to follow-up after evaluation for that's for several times and progressed rawness bleeding and stool leakage over the past 2 months. She eventually saw a surgeon who noted a large nonhealing anal ulcer biopsy was and Compazine 50% of her right anus extending greater than 5 cm. Biopsy was positive for moderately differentiated keratinizing invasive squamous cell carcinoma with lymphovascular invasion. Tumor invaded the muscularis propria to involve the perianal skin. Patient has lost about 80 pounds over the past 6 months. She went on to have a diverting colostomy. She is now seen for radiation oncology opinion regarding concurrent chemotherapy and radiation therapy with curative intent. CT scan of her abdomen and pelvis demonstrated borderline-enlarged iliac lymph nodes a large lower rectal anal mass compatible with clinical history. She is seen today complains of continued leakage from her anus even though she's had been diverted. She is also quite fatigued.  PLANNED TREATMENT REGIMEN: Combined chemotherapy and radiation therapy with curative intent  PAST MEDICAL HISTORY:  has a past medical history of Schizophrenia;  Asthma; GERD (gastroesophageal reflux disease); Anxiety; Depression; Bipolar disorder; COPD (chronic obstructive pulmonary disease); Occasional tremors; PTSD (post-traumatic stress disorder); Shortness of breath dyspnea; Fibromyalgia; DVT (deep venous thrombosis) (2011); Thyroid nodule; DDD (degenerative disc disease), lumbar; Spinal stenosis; Peripheral neuropathy; Rotator cuff tear; Pneumonia (2011); Hypothyroidism; Anemia; Hypertension; and Squamous cell cancer, anus.    PAST SURGICAL HISTORY:  Past Surgical History  Procedure Laterality Date  . Foot surgery Right   . Tubal ligation    . Eye surgery Bilateral   . Mouth surgery  2002  . Rectal biopsy N/A 05/08/2014    Procedure: BIOPSY RECTAL;  Surgeon: Marlyce Huge, MD;  Location: [facility] ORS;  Service: General;  Laterality: N/A;  . Evaluation under anesthesia with hemorrhoidectomy N/A 05/08/2014    Procedure: EXAM UNDER ANESTHESIA WITH HEMORRHOIDECTOMY;  Surgeon: Marlyce Huge, MD;  Location: [facility] ORS;  Service: General;  Laterality: N/A;  . Portacath placement N/A 05/20/2014    Procedure: INSERTION PORT-A-CATH;  Surgeon: Florene Glen, MD;  Location: [facility] ORS;  Service: General;  Laterality: N/A;  . Laparoscopic diverted colostomy N/A 05/26/2014    Procedure: LAPAROSCOPIC DIVERTED COLOSTOMY;  Surgeon: Marlyce Huge, MD;  Location: [facility] ORS;  Service: General;  Laterality: N/A;    FAMILY HISTORY: family history includes Cancer in her maternal aunt and paternal uncle.  SOCIAL HISTORY:  reports that she has been smoking Cigarettes.  She has a 11 pack-year smoking history. She has never used smokeless tobacco. She reports that she drinks alcohol. She reports that she does not use illicit drugs.  ALLERGIES: Sulfa antibiotics  MEDICATIONS:  Current Outpatient Prescriptions  Medication Sig Dispense Refill  . albuterol (PROVENTIL) (2.5 MG/3ML) 0.083% nebulizer  solution Inhale 3 mLs (2.5 mg total) into the lungs every 6  (six) hours as needed for wheezing or shortness of breath. 75 mL 12  . budesonide-formoterol (SYMBICORT) 160-4.5 MCG/ACT inhaler Inhale 2 puffs into the lungs 2 (two) times daily. 1 Inhaler 12  . clonazePAM (KLONOPIN) 1 MG tablet Take 1 tablet (1 mg total) by mouth 3 (three) times daily as needed (anxiety). 30 tablet 0  . docusate sodium (COLACE) 100 MG capsule Take 2 capsules (200 mg total) by mouth 2 (two) times daily. 120 capsule 0  . DULoxetine (CYMBALTA) 60 MG capsule Take 60 mg by mouth every evening.    . escitalopram (LEXAPRO) 20 MG tablet Take 20 mg by mouth at bedtime.    Marland Kitchen etodolac (LODINE) 500 MG tablet Take 500 mg by mouth 2 (two) times daily.    Marland Kitchen oxyCODONE-acetaminophen (PERCOCET/ROXICET) 5-325 MG per tablet Take 2 tablets by mouth every 6 (six) hours as needed for moderate pain or severe pain. 60 tablet 0  . polyethylene glycol powder (GLYCOLAX/MIRALAX) powder 1 cap full in a full glass of water, once a day for 5 days. 255 g 0  . pramoxine (PROCTOFOAM) 1 % foam Place 1 application rectally 3 (three) times daily as needed for itching. 15 g 0  . ranitidine (ZANTAC) 150 MG tablet Take 150 mg by mouth 2 (two) times daily.    Marland Kitchen senna (SENOKOT) 8.6 MG TABS tablet Take 2 tablets (17.2 mg total) by mouth 2 (two) times daily. 120 each 0  . simvastatin (ZOCOR) 40 MG tablet Take 40 mg by mouth every evening.     . tiotropium (SPIRIVA) 18 MCG inhalation capsule Place 18 mcg into inhaler and inhale every morning.     . traMADol (ULTRAM) 50 MG tablet Take 50 mg by mouth every 6 (six) hours as needed.    Addison Lank Hazel (TUCKS) 50 % PADS Apply 1 application topically every 6 (six) hours. 40 each 0  . zolpidem (AMBIEN) 5 MG tablet Take 5 mg by mouth at bedtime as needed for sleep.     No current facility-administered medications for this encounter.    ECOG PERFORMANCE STATUS:  1 - Symptomatic but completely ambulatory  REVIEW OF SYSTEMS:  Patient denies any weight loss, fatigue, weakness,  fever, chills or night sweats. Patient denies any loss of vision, blurred vision. Patient denies any ringing  of the ears or hearing loss. No irregular heartbeat. Patient denies heart murmur or history of fainting. Patient denies any chest pain or pain radiating to her upper extremities. Patient denies any shortness of breath, difficulty breathing at night, cough or hemoptysis. Patient denies any swelling in the lower legs. Patient denies any nausea vomiting, vomiting of blood, or coffee ground material in the vomitus. Patient denies any stomach pain. Patient states has had normal bowel movements no significant constipation or diarrhea. Patient denies any dysuria, hematuria or significant nocturia. Patient denies any problems walking, swelling in the joints or loss of balance. Patient denies any skin changes, loss of hair or loss of weight. Patient denies any excessive worrying or anxiety or significant depression. Patient denies any problems with insomnia. Patient denies excessive thirst, polyuria, polydipsia. Patient denies any swollen glands, patient denies easy bruising or easy bleeding. Patient denies any recent infections, allergies or URI. Patient "s visual fields have not changed significantly in recent time.    PHYSICAL EXAM: BP 102/52 mmHg  Pulse 65  Temp(Src) 98.9 F (37.2 C)  Wt 189 lb 6 oz (  85.9 kg) There is a large mass protruding from her anus consistent with known anal carcinoma. It is apparent skin invasion is noted. No palpable inguinal adenopathy is identified. Well-developed well-nourished patient in NAD. HEENT reveals PERLA, EOMI, discs not visualized.  Oral cavity is clear. No oral mucosal lesions are identified. Neck is clear without evidence of cervical or supraclavicular adenopathy. Lungs are clear to A&P. Cardiac examination is essentially unremarkable with regular rate and rhythm without murmur rub or thrill. Abdomen is benign with no organomegaly or masses noted. Motor sensory  and DTR levels are equal and symmetric in the upper and lower extremities. Cranial nerves II through XII are grossly intact. Proprioception is intact. No peripheral adenopathy or edema is identified. No motor or sensory levels are noted. Crude visual fields are within normal range.   LABORATORY DATA:  No results found for this or any previous visit (from the past 72 hour(s)).   RADIOLOGY RESULTS: CT scans reviewed IMPRESSION: At least stage II squamous cell carcinoma the anus in 59 year old female with multiple comorbidities including schizophrenia and bipolar disorder.  PLAN: At this time I to go ahead with radiation therapy. Would plan on delivering 4500 cGy to her anus pelvic lymph nodes and inguinal nodes. With that evaluate for response and possibly boost another 2000 cGy to her anal area. This would be in conjunction with systemic chemotherapy under medical oncology's direction. Risks and benefits of treatment including diarrhea, dysuria, fatigue, alteration of blood counts, skin reaction all were discussed in detail with the patient. Certainly I explained to her there is a risk she will never have full continence of her rectum again since there is probably damage to her sphincter from the tumor. I have set up and ordered CT simulation later this week. We'll coordinate her chemotherapy with medical oncology.  I would like to take this opportunity for allowing me to participate in the care of your patient.Armstead Peaks., MD

## 2014-06-20 NOTE — Patient Outreach (Signed)
Attempt made to contact pt, person answering phone  states pt not home right now.   Plan to try and call pt again, f/u on [facility] referral, assess for  case management needs.       [name]   [facility]  [phone]

## 2014-06-21 ENCOUNTER — Institutional Professional Consult (permissible substitution): Payer: Commercial Managed Care - HMO | Admitting: Radiation Oncology

## 2014-06-21 NOTE — Progress Notes (Unsigned)
PSN spoke with RN navigator, Mariea Clonts about patient's need for medical supplies related to her diagnosis.  [facility] will deliver supplies to patient tomorrow.  [place] will pay for these supplies.  PSN will call patient when he returns on 07/03/14 to meet with patient and assess for other needs.

## 2014-06-22 ENCOUNTER — Ambulatory Visit
Admission: RE | Admit: 2014-06-22 | Discharge: 2014-06-22 | Disposition: A | Discharge status: Home / Self Care | Payer: Commercial Managed Care - HMO | Source: Ambulatory Visit | Attending: Radiation Oncology | Admitting: Radiation Oncology

## 2014-06-22 ENCOUNTER — Other Ambulatory Visit: Payer: Self-pay | Admitting: *Deleted

## 2014-06-22 ENCOUNTER — Ambulatory Visit: Payer: Commercial Managed Care - HMO

## 2014-06-22 ENCOUNTER — Inpatient Hospital Stay: Payer: Commercial Managed Care - HMO

## 2014-06-22 DIAGNOSIS — Z510 Encounter for antineoplastic radiation therapy: Secondary | ICD-10-CM | POA: Diagnosis present

## 2014-06-22 DIAGNOSIS — C210 Malignant neoplasm of anus, unspecified: Secondary | ICD-10-CM | POA: Diagnosis not present

## 2014-06-22 MED ORDER — OXYCODONE-ACETAMINOPHEN 5-325 MG PO TABS: 1.0000 | ORAL_TABLET | Freq: Four times a day (QID) | ORAL | Status: DC | PRN

## 2014-06-22 NOTE — Patient Outreach (Signed)
Attempt made to contact pt, assess case management needs, ? Schedule home visit.   HIPPA compliant voice message left with contact number.   Will try again.    [name]   [facility]  [phone]

## 2014-06-23 ENCOUNTER — Inpatient Hospital Stay: Payer: Commercial Managed Care - HMO

## 2014-06-26 ENCOUNTER — Encounter: Payer: Self-pay | Admitting: Hematology and Oncology

## 2014-06-26 ENCOUNTER — Other Ambulatory Visit: Payer: Self-pay | Admitting: Hematology and Oncology

## 2014-06-26 ENCOUNTER — Inpatient Hospital Stay: Payer: Commercial Managed Care - HMO

## 2014-06-26 DIAGNOSIS — C210 Malignant neoplasm of anus, unspecified: Secondary | ICD-10-CM

## 2014-06-26 MED ORDER — ONDANSETRON HCL 8 MG PO TABS: 8.0000 mg | ORAL_TABLET | Freq: Two times a day (BID) | ORAL | Status: DC

## 2014-06-27 ENCOUNTER — Encounter: Payer: Self-pay | Admitting: Hematology and Oncology

## 2014-06-27 ENCOUNTER — Inpatient Hospital Stay: Payer: Commercial Managed Care - HMO

## 2014-06-27 ENCOUNTER — Inpatient Hospital Stay (HOSPITAL_BASED_OUTPATIENT_CLINIC_OR_DEPARTMENT_OTHER): Payer: Commercial Managed Care - HMO | Admitting: Hematology and Oncology

## 2014-06-27 ENCOUNTER — Other Ambulatory Visit: Payer: Self-pay | Admitting: *Deleted

## 2014-06-27 ENCOUNTER — Ambulatory Visit
Admission: RE | Admit: 2014-06-27 | Discharge: 2014-06-27 | Disposition: A | Discharge status: Home / Self Care | Payer: Commercial Managed Care - HMO | Source: Ambulatory Visit | Attending: Radiation Oncology | Admitting: Radiation Oncology

## 2014-06-27 VITALS — BP 131/85 | HR 101 | Temp 98.5°F | Resp 16 | Wt 189.4 lb

## 2014-06-27 DIAGNOSIS — J45909 Unspecified asthma, uncomplicated: Secondary | ICD-10-CM

## 2014-06-27 DIAGNOSIS — E039 Hypothyroidism, unspecified: Secondary | ICD-10-CM

## 2014-06-27 DIAGNOSIS — K648 Other hemorrhoids: Secondary | ICD-10-CM | POA: Insufficient documentation

## 2014-06-27 DIAGNOSIS — Z79899 Other long term (current) drug therapy: Secondary | ICD-10-CM | POA: Diagnosis not present

## 2014-06-27 DIAGNOSIS — E041 Nontoxic single thyroid nodule: Secondary | ICD-10-CM

## 2014-06-27 DIAGNOSIS — Z933 Colostomy status: Secondary | ICD-10-CM

## 2014-06-27 DIAGNOSIS — K602 Anal fissure, unspecified: Secondary | ICD-10-CM | POA: Insufficient documentation

## 2014-06-27 DIAGNOSIS — M5136 Other intervertebral disc degeneration, lumbar region: Secondary | ICD-10-CM

## 2014-06-27 DIAGNOSIS — IMO0001 Reserved for inherently not codable concepts without codable children: Secondary | ICD-10-CM | POA: Insufficient documentation

## 2014-06-27 DIAGNOSIS — K6289 Other specified diseases of anus and rectum: Secondary | ICD-10-CM | POA: Insufficient documentation

## 2014-06-27 DIAGNOSIS — J449 Chronic obstructive pulmonary disease, unspecified: Secondary | ICD-10-CM

## 2014-06-27 DIAGNOSIS — K649 Unspecified hemorrhoids: Secondary | ICD-10-CM

## 2014-06-27 DIAGNOSIS — F209 Schizophrenia, unspecified: Secondary | ICD-10-CM | POA: Insufficient documentation

## 2014-06-27 DIAGNOSIS — R918 Other nonspecific abnormal finding of lung field: Secondary | ICD-10-CM

## 2014-06-27 DIAGNOSIS — C210 Malignant neoplasm of anus, unspecified: Secondary | ICD-10-CM

## 2014-06-27 DIAGNOSIS — K219 Gastro-esophageal reflux disease without esophagitis: Secondary | ICD-10-CM

## 2014-06-27 DIAGNOSIS — Z5111 Encounter for antineoplastic chemotherapy: Secondary | ICD-10-CM | POA: Diagnosis not present

## 2014-06-27 DIAGNOSIS — F4310 Post-traumatic stress disorder, unspecified: Secondary | ICD-10-CM

## 2014-06-27 DIAGNOSIS — F319 Bipolar disorder, unspecified: Secondary | ICD-10-CM

## 2014-06-27 DIAGNOSIS — F329 Major depressive disorder, single episode, unspecified: Secondary | ICD-10-CM | POA: Insufficient documentation

## 2014-06-27 DIAGNOSIS — Z8659 Personal history of other mental and behavioral disorders: Secondary | ICD-10-CM | POA: Insufficient documentation

## 2014-06-27 DIAGNOSIS — R634 Abnormal weight loss: Secondary | ICD-10-CM

## 2014-06-27 DIAGNOSIS — K644 Residual hemorrhoidal skin tags: Secondary | ICD-10-CM | POA: Insufficient documentation

## 2014-06-27 DIAGNOSIS — I82409 Acute embolism and thrombosis of unspecified deep veins of unspecified lower extremity: Secondary | ICD-10-CM | POA: Insufficient documentation

## 2014-06-27 DIAGNOSIS — M797 Fibromyalgia: Secondary | ICD-10-CM

## 2014-06-27 DIAGNOSIS — E785 Hyperlipidemia, unspecified: Secondary | ICD-10-CM | POA: Insufficient documentation

## 2014-06-27 DIAGNOSIS — Z86718 Personal history of other venous thrombosis and embolism: Secondary | ICD-10-CM

## 2014-06-27 DIAGNOSIS — G4733 Obstructive sleep apnea (adult) (pediatric): Secondary | ICD-10-CM

## 2014-06-27 DIAGNOSIS — F17210 Nicotine dependence, cigarettes, uncomplicated: Secondary | ICD-10-CM

## 2014-06-27 DIAGNOSIS — G629 Polyneuropathy, unspecified: Secondary | ICD-10-CM

## 2014-06-27 DIAGNOSIS — F419 Anxiety disorder, unspecified: Secondary | ICD-10-CM

## 2014-06-27 DIAGNOSIS — I10 Essential (primary) hypertension: Secondary | ICD-10-CM

## 2014-06-27 DIAGNOSIS — Z510 Encounter for antineoplastic radiation therapy: Secondary | ICD-10-CM | POA: Diagnosis not present

## 2014-06-27 DIAGNOSIS — K645 Perianal venous thrombosis: Secondary | ICD-10-CM | POA: Insufficient documentation

## 2014-06-27 DIAGNOSIS — F32A Depression, unspecified: Secondary | ICD-10-CM | POA: Insufficient documentation

## 2014-06-27 HISTORY — DX: Obstructive sleep apnea (adult) (pediatric): G47.33

## 2014-06-27 HISTORY — DX: Unspecified hemorrhoids: K64.9

## 2014-06-27 LAB — CBC WITH DIFFERENTIAL/PLATELET
Basophils Absolute: 0.1 10*3/uL (ref 0–0.1)
Basophils Relative: 1 %
Eosinophils Absolute: 0.4 10*3/uL (ref 0–0.7)
Eosinophils Relative: 3 %
HCT: 42.3 % (ref 35.0–47.0)
Hemoglobin: 13.9 g/dL (ref 12.0–16.0)
Lymphocytes Relative: 18 %
Lymphs Abs: 3 10*3/uL (ref 1.0–3.6)
MCH: 29.9 pg (ref 26.0–34.0)
MCHC: 32.7 g/dL (ref 32.0–36.0)
MCV: 91.2 fL (ref 80.0–100.0)
Monocytes Absolute: 0.7 10*3/uL (ref 0.2–0.9)
Monocytes Relative: 4 %
Neutro Abs: 12.3 10*3/uL — ABNORMAL HIGH (ref 1.4–6.5)
Neutrophils Relative %: 74 %
Platelets: 323 10*3/uL (ref 150–440)
RBC: 4.64 MIL/uL (ref 3.80–5.20)
RDW: 14.8 % — ABNORMAL HIGH (ref 11.5–14.5)
WBC: 16.5 10*3/uL — ABNORMAL HIGH (ref 3.6–11.0)

## 2014-06-27 LAB — COMPREHENSIVE METABOLIC PANEL
ALT: 11 U/L — ABNORMAL LOW (ref 14–54)
AST: 24 U/L (ref 15–41)
Albumin: 2.4 g/dL — ABNORMAL LOW (ref 3.5–5.0)
Alkaline Phosphatase: 94 U/L (ref 38–126)
Anion gap: 8 (ref 5–15)
BUN: 10 mg/dL (ref 6–20)
CO2: 25 mmol/L (ref 22–32)
Calcium: 8.1 mg/dL — ABNORMAL LOW (ref 8.9–10.3)
Chloride: 98 mmol/L — ABNORMAL LOW (ref 101–111)
Creatinine, Ser: 0.96 mg/dL (ref 0.44–1.00)
GFR calc Af Amer: >60 mL/min (ref >60)
GFR calc non Af Amer: >60 mL/min (ref >60)
Glucose, Bld: 130 mg/dL — ABNORMAL HIGH (ref 65–99)
Potassium: 3.6 mmol/L (ref 3.5–5.1)
Sodium: 131 mmol/L — ABNORMAL LOW (ref 135–145)
Total Bilirubin: 0.5 mg/dL (ref 0.3–1.2)
Total Protein: 6.8 g/dL (ref 6.5–8.1)

## 2014-06-27 MED ORDER — CLONAZEPAM 1 MG PO TABS: 1.0000 mg | ORAL_TABLET | Freq: Three times a day (TID) | ORAL | Status: DC | PRN

## 2014-06-27 NOTE — Progress Notes (Signed)
Oncology Nurse Navigator Documentation  Oncology Nurse Navigator Flowsheets 06/16/2014 06/20/2014 06/22/2014 06/27/2014  Navigator Encounter Type Initial MedOnc Initial RadOnc Other Other  Patient Visit Type Medonc Radonc Radonc Medonc  Treatment Phase - - CT SIM -  Manufacturing engineer;Financial - -  Specialty Items/DME - [employer] supplies - -  Time Spent with Patient '30 30 30 15   '$ Reports she has received her sanitation products and donut medical pillow at home. Reports stoma paste has helped resolve all the redness at stoma site. No further leaking reported. Reports no further immediate needs at present.

## 2014-06-27 NOTE — Progress Notes (Signed)
[facility] day:  06/27/2014  Chief Complaint: [name] is a 59 y.o. female with squamous cell carcinoma of the anus who is seen for assessment prior to initiation of cycle #1 mitomycin C and 5-fluorouracil with concurrent radiation.Marland Kitchen  HPI: The patient was last seen in the medical oncology clinic on 06/16/2014.  At that time, she was seen for assessment after recent hospitalization.  At that time, she was diagnosed with squamous cell carcinoma of the anus.  Staging studies revealed no evidence of metastatic disease. There was a 4 mm RUL pulmonary nodule. There were borderline (13 mm) enlarged iliac nodes. There was a 4.7 x 4.1 x 6.0 cm lower rectal and anal mass consistent with anal cancer.  She underwent diverting colostomy on 05/26/2014. She had ongoing anal leakage.She had gained weight.  She states that radiation starts tomorrow.  She has chemotherapy teaching today.  She feels anxious.  She is runnning out her clonazepam.  She continues to have rectal leakage (changes pads every hour).  Past Medical History  Diagnosis Date  . Schizophrenia   . Asthma   . GERD (gastroesophageal reflux disease)   . Anxiety   . Depression   . Bipolar disorder   . COPD (chronic obstructive pulmonary disease)   . Occasional tremors     right hand  . PTSD (post-traumatic stress disorder)   . Shortness of breath dyspnea   . Fibromyalgia   . DVT (deep venous thrombosis) 2011    RUE  . Thyroid nodule   . DDD (degenerative disc disease), lumbar   . Spinal stenosis   . Peripheral neuropathy   . Rotator cuff tear     right  . Pneumonia 2011  . Hypothyroidism     no meds currently  . Anemia     during pregnancy only  . Hypertension     Off meds x 15 years-well controlled now per pt  . Squamous cell cancer, anus   . Severe obstructive sleep apnea 06/27/2014    Past Surgical History  Procedure Laterality Date  . Foot surgery Right   .  Tubal ligation    . Eye surgery Bilateral   . Mouth surgery  2002  . Rectal biopsy N/A 05/08/2014    Procedure: BIOPSY RECTAL;  Surgeon: Marlyce Huge, MD;  Location: [facility] ORS;  Service: General;  Laterality: N/A;  . Evaluation under anesthesia with hemorrhoidectomy N/A 05/08/2014    Procedure: EXAM UNDER ANESTHESIA WITH HEMORRHOIDECTOMY;  Surgeon: Marlyce Huge, MD;  Location: [facility] ORS;  Service: General;  Laterality: N/A;  . Portacath placement N/A 05/20/2014    Procedure: INSERTION PORT-A-CATH;  Surgeon: Florene Glen, MD;  Location: [facility] ORS;  Service: General;  Laterality: N/A;  . Laparoscopic diverted colostomy N/A 05/26/2014    Procedure: LAPAROSCOPIC DIVERTED COLOSTOMY;  Surgeon: Marlyce Huge, MD;  Location: [facility] ORS;  Service: General;  Laterality: N/A;    Family History  Problem Relation Age of Onset  . Cancer Maternal Aunt   . Cancer Paternal Uncle     Social History:  reports that she has been smoking Cigarettes.  She has a 11 pack-year smoking history. She has never used smokeless tobacco. She reports that she drinks alcohol. She reports that she does not use illicit drugs.  The patient is accompanied by Amy, her "adopted daughter".  Allergies:  Allergies  Allergen Reactions  . Sulfa Antibiotics Hives    Current Medications: Current Outpatient Prescriptions  Medication Sig  Dispense Refill  . albuterol (PROVENTIL) (2.5 MG/3ML) 0.083% nebulizer solution Inhale 3 mLs (2.5 mg total) into the lungs every 6 (six) hours as needed for wheezing or shortness of breath. (Patient not taking: Reported on 06/28/2014) 75 mL 12  . budesonide-formoterol (SYMBICORT) 160-4.5 MCG/ACT inhaler Inhale 2 puffs into the lungs 2 (two) times daily. 1 Inhaler 12  . clonazePAM (KLONOPIN) 1 MG tablet Take 1 tablet (1 mg total) by mouth 3 (three) times daily as needed (anxiety). 30 tablet 0  . docusate sodium (COLACE) 100 MG capsule Take 2 capsules (200 mg total) by mouth 2 (two)  times daily. 120 capsule 0  . DULoxetine (CYMBALTA) 60 MG capsule Take 60 mg by mouth every evening.    . escitalopram (LEXAPRO) 20 MG tablet Take 20 mg by mouth at bedtime.    Marland Kitchen etodolac (LODINE) 500 MG tablet Take 500 mg by mouth 2 (two) times daily.    . ondansetron (ZOFRAN) 8 MG tablet Take 1 tablet (8 mg total) by mouth 2 (two) times daily. Start the day after chemo for 2 days. Then take as needed for nausea or vomiting. 30 tablet 1  . polyethylene glycol powder (GLYCOLAX/MIRALAX) powder 1 cap full in a full glass of water, once a day for 5 days. 255 g 0  . pramoxine (PROCTOFOAM) 1 % foam Place 1 application rectally 3 (three) times daily as needed for itching. 15 g 0  . ranitidine (ZANTAC) 150 MG tablet Take 150 mg by mouth 2 (two) times daily.    Marland Kitchen senna (SENOKOT) 8.6 MG TABS tablet Take 2 tablets (17.2 mg total) by mouth 2 (two) times daily. 120 each 0  . simvastatin (ZOCOR) 40 MG tablet Take 40 mg by mouth every evening.     . tiotropium (SPIRIVA) 18 MCG inhalation capsule Place 18 mcg into inhaler and inhale every morning.     . traMADol (ULTRAM) 50 MG tablet Take 50 mg by mouth every 6 (six) hours as needed.    Addison Lank Hazel (TUCKS) 50 % PADS Apply 1 application topically every 6 (six) hours. 40 each 0  . zolpidem (AMBIEN) 5 MG tablet Take 5 mg by mouth at bedtime as needed for sleep.    . Aclidinium Bromide 400 MCG/ACT AEPB Inhale into the lungs. Pt to take twice a day.    . Alum & Mag Hydroxide-Simeth (MAGIC MOUTHWASH) SOLN Take 5 mLs by mouth 4 (four) times daily. 480 mL 3  . oxyCODONE-acetaminophen (PERCOCET/ROXICET) 5-325 MG per tablet Take 1-2 tablets by mouth every 6 (six) hours as needed for severe pain. 60 tablet 0   No current facility-administered medications for this visit.   Facility-Administered Medications Ordered in Other Visits  Medication Dose Route Frequency Provider Last Rate Last Dose  . heparin lock flush 100 unit/mL  500 Units Intravenous Once Lequita Asal, MD      . sodium chloride 0.9 % injection 10 mL  10 mL Intravenous PRN Lequita Asal, MD        Review of Systems:  GENERAL:  Feels the saem.  No fevers, sweats or weight loss. PERFORMANCE STATUS (ECOG):  2 HEENT:  No visual changes, runny nose, sore throat, mouth sores or tenderness. Lungs: No shortness of breath or cough.  No hemoptysis. Cardiac:  No chest pain, palpitations, orthopnea, or PND. GI:  Poor eating.  Leakage from anus requiring pad change every hour.  No nausea, vomiting, constipation, melena or hematochezia. GU:  No urgency, frequency, dysuria,  or hematuria. Musculoskeletal:  No back pain.  No joint pain.  No muscle tenderness. Extremities:  No pain or swelling. Skin:  No rashes or skin changes. Neuro:  No headache, numbness or weakness, balance or coordination issues. Endocrine:  No diabetes, thyroid issues, hot flashes or night sweats. Psych:  Ambien helps with sleep.  On Lexapro/Cymbalta.  Needs clonazepam.  Anxious.  No mood depression. Pain:  Some days pain worse than other days. Review of systems:  All other systems reviewed and found to be negative.  Physical Exam: Blood pressure 131/85, pulse 101, temperature 98.5 F (36.9 C), temperature source Tympanic, resp. rate 16, weight 189 lb 6 oz (85.9 kg). GENERAL: Chronically ill appearing woman sitting uncomfortably in the exam room in no acute distress. Room has a foul odor. MENTAL STATUS: Alert and oriented to person, place and time. HEAD: Pearline Cables hair pulled back. Glasses propped on head.  Normocephalic, atraumatic, face symmetric, no Cushingoid features. EYES: Blue eyes. Pupils equal round and reactive to light and accomodation. No conjunctivitis or scleral icterus. ENT: Oropharynx clear without lesion. Tongue normal. Mucous membranes moist.  RESPIRATORY: Clear to auscultation without rales, wheezes or rhonchi. CARDIOVASCULAR: Regular rate and rhythm without murmur, rub or  gallop. ABDOMEN: Soft, non-tender, with active bowel sounds, and no hepatosplenomegaly. No masses. RECTUM: 4 cm right sided cauliflower anal mass with leakage of brown foul smelling material.  SKIN: No rashes, ulcers or lesions. EXTREMITIES: No edema, no skin discoloration or tenderness. No palpable cords. LYMPH NODES: No palpable cervical, supraclavicular, axillary or inguinal adenopathy  NEUROLOGICAL: Unremarkable. PSYCH: Appropriate. Slightly anxious.  Clinical Support on 06/27/2014  Component Date Value Ref Range Status  . WBC 06/27/2014 16.5* 3.6 - 11.0 K/uL Final  . RBC 06/27/2014 4.64  3.80 - 5.20 MIL/uL Final  . Hemoglobin 06/27/2014 13.9  12.0 - 16.0 g/dL Final  . HCT 06/27/2014 42.3  35.0 - 47.0 % Final  . MCV 06/27/2014 91.2  80.0 - 100.0 fL Final  . MCH 06/27/2014 29.9  26.0 - 34.0 pg Final  . MCHC 06/27/2014 32.7  32.0 - 36.0 g/dL Final  . RDW 06/27/2014 14.8* 11.5 - 14.5 % Final  . Platelets 06/27/2014 323  150 - 440 K/uL Final  . Neutrophils Relative % 06/27/2014 74   Final  . Neutro Abs 06/27/2014 12.3* 1.4 - 6.5 K/uL Final  . Lymphocytes Relative 06/27/2014 18   Final  . Lymphs Abs 06/27/2014 3.0  1.0 - 3.6 K/uL Final  . Monocytes Relative 06/27/2014 4   Final  . Monocytes Absolute 06/27/2014 0.7  0.2 - 0.9 K/uL Final  . Eosinophils Relative 06/27/2014 3   Final  . Eosinophils Absolute 06/27/2014 0.4  0 - 0.7 K/uL Final  . Basophils Relative 06/27/2014 1   Final  . Basophils Absolute 06/27/2014 0.1  0 - 0.1 K/uL Final  . Sodium 06/27/2014 131* 135 - 145 mmol/L Final  . Potassium 06/27/2014 3.6  3.5 - 5.1 mmol/L Final  . Chloride 06/27/2014 98* 101 - 111 mmol/L Final  . CO2 06/27/2014 25  22 - 32 mmol/L Final  . Glucose, Bld 06/27/2014 130* 65 - 99 mg/dL Final  . BUN 06/27/2014 10  6 - 20 mg/dL Final  . Creatinine, Ser 06/27/2014 0.96  0.44 - 1.00 mg/dL Final  . Calcium 06/27/2014 8.1* 8.9 - 10.3 mg/dL Final  . Total Protein 06/27/2014 6.8  6.5 - 8.1 g/dL  Final  . Albumin 06/27/2014 2.4* 3.5 - 5.0 g/dL Final  . AST 06/27/2014  24  15 - 41 U/L Final  . ALT 06/27/2014 11* 14 - 54 U/L Final  . Alkaline Phosphatase 06/27/2014 94  38 - 126 U/L Final  . Total Bilirubin 06/27/2014 0.5  0.3 - 1.2 mg/dL Final  . GFR calc non Af Amer 06/27/2014 >60  >60 mL/min Final  . GFR calc Af Amer 06/27/2014 >60  >60 mL/min Final   Comment: (NOTE) The eGFR has been calculated using the CKD EPI equation. This calculation has not been validated in all clinical situations. eGFR's persistently <60 mL/min signify possible Chronic Kidney Disease.   . Anion gap 06/27/2014 8  5 - 15 Final    Assessment:  [name] is a 59 y.o. 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  Chest, abdomen, and pelvic CT scan on 05/19/2014 revealed no evidence of metastatic disease. There was a 4 mm RUL pulmonary nodule. There were borderline (13 mm) enlarged iliac nodes. There was a 4.7 x 4.1 x 6.0 cm lower rectal and anal mass consistent with anal cancer.  She underwent diverting colostomy on 05/26/2014. She has ongoing anal leakage.  Plan: 1.  Labs today:  CBC with diff, CMP. 2.  Review potential side effects of chemotherapy.  Patient consented to treatment. 3.  Chemotherapy class today with nursing. 4.  Begin cycle #1 5FU and mitomycin C tomorrow. 5.  Disconnect 5FU pump on Sunday (07/02/2014). 6.  Rx:  Clonazepam.  Patient to receive future prescriptions from mental health provider. 7.  RTC in 1 week for MD assess and labs (BMP).   Lequita Asal, MD

## 2014-06-27 NOTE — Progress Notes (Signed)
   06/27/14 1325  Clinical Encounter Type  Visited With Patient  Visit Type Initial  Consult/Referral To Chaplain  Spoke to patient and her daughter.  Pt said she was in some pain, but doing ok.  Is here for lab work.  Has a scheduled appt for radiation treatment starting tomorrow.  Is pleased with care she is receiving here.  Pt thanked me for visiting with her.  H. [place]

## 2014-06-27 NOTE — Patient Outreach (Signed)
Third attempt made to contact pt, assess for case management needs.  HIPPA compliant voice message left with contact number.  Note- [facility] SW is following pt, therefore, RN CM will f/u with [facility] SW to let her know unable to contact pt to see if she can relay to pt attempts made by RN CM.   With this being the third attempt, if  no response from pt after talking with [facility] SW, plan to close case.       [name]   [address] Care Management  [phone]

## 2014-06-27 NOTE — Patient Outreach (Signed)
Received a return phone call from pt, HIPPA verified.   Discussed with pt reason for call from RN CM, discuss [facility] community nurse case management services to which pt was interested in.   Home visit scheduled for 6/22.      Plan to f/u with pt on 6/22- initial home visit.

## 2014-06-28 ENCOUNTER — Ambulatory Visit: Payer: Commercial Managed Care - HMO

## 2014-06-28 ENCOUNTER — Inpatient Hospital Stay: Payer: Commercial Managed Care - HMO

## 2014-06-28 ENCOUNTER — Other Ambulatory Visit: Payer: Self-pay

## 2014-06-28 ENCOUNTER — Telehealth: Payer: Self-pay

## 2014-06-28 ENCOUNTER — Encounter: Payer: Commercial Managed Care - HMO | Admitting: *Deleted

## 2014-06-28 ENCOUNTER — Ambulatory Visit
Admission: RE | Admit: 2014-06-28 | Discharge: 2014-06-28 | Disposition: A | Discharge status: Home / Self Care | Payer: Commercial Managed Care - HMO | Source: Ambulatory Visit | Attending: Radiation Oncology | Admitting: Radiation Oncology

## 2014-06-28 ENCOUNTER — Other Ambulatory Visit: Payer: Self-pay | Admitting: *Deleted

## 2014-06-28 VITALS — BP 120/72 | HR 76 | Resp 20 | Ht 68.5 in | Wt 188.0 lb

## 2014-06-28 DIAGNOSIS — C210 Malignant neoplasm of anus, unspecified: Secondary | ICD-10-CM

## 2014-06-28 MED ORDER — SODIUM CHLORIDE 0.9 % IV SOLN
Freq: Once | INTRAVENOUS | Status: DC
  Filled 2014-06-28: qty 1000

## 2014-06-28 MED ORDER — SODIUM CHLORIDE 0.9 % IV SOLN
Freq: Once | INTRAVENOUS | Status: DC
  Filled 2014-06-28: qty 4

## 2014-06-28 NOTE — Patient Outreach (Signed)
I called [name] to discuss assisting with her medications.  I had to leave a HIPPA compliant message for her to call me back.  If I do not hear from her today, I will reach out again tomorrow.   Deanne Coffer, PharmD, [place] [phone]

## 2014-06-28 NOTE — Telephone Encounter (Signed)
Oncology Nurse Navigator Documentation  Oncology Nurse Navigator Flowsheets 06/16/2014 06/20/2014 06/22/2014 06/27/2014 06/28/2014  Navigator Encounter Type Initial MedOnc Initial RadOnc Other Other Telephone  Patient Visit Type Medonc Radonc Radonc Medonc -  Treatment Phase - - CT SIM - -  Manufacturing engineer;Financial - - -  Specialty Items/DME - [place] supplies - - -  Time Spent with Patient '30 30 30 15 15   '$ Was notified that when [name] arrived to pick [name] up at her residence a man came out and said she wasn't coming. Attempts made at calling [name] and her daughter with voicemails received. Messages left on both to return call. A man answered a (W) number listed under demographics and just stated "no" when asked if this was a number for [employer].

## 2014-06-28 NOTE — Patient Outreach (Signed)
[name] [name]) Care Management  [name]  06/28/2014   [name] [birth year] [id number]  Subjective:  Pt reports was suppose to receive meals from [facility] yesterday, never showed up.  Pt states radiation is to start today, might have to start tomorrow, cannot sit up (tumor in rectum keeps Sphincter open- resulting in leakage,bleeding,pain).  Pt states she is to have 5 weeks of radiation, then 4-6 weeks of  Chemo.  Pt states she was told after 5 days of radiation, tumor will shrink, bleeding stop.   Pt states she was  Suppose to be put on BP medication as pain elevated BP, was told to check BP but cannot afford BP machine.  Pt states has hx of COPD, does not have Spiriva. Pt states Dr. Humphrey Rolls gave her samples of Tudorza to use until she gets the Spiriva plus  samples of Symbicort.  Pt reports started  [place] 6/11, does not do as good as  Spiriva, gets  sob with walking short distances.  Pt states she has several meds at the pharmacy ready to be picked up (includes Spiriva) but cannot afford to pay for them now.  Pt states she is in need of a hospital bed, cancer MD is working on the paperwork plus insurance is suppose to send out a physical therapist.    Objective: ROS  Review of systems-  Alert and orientated x 3, lungs clear, ostomy intact.       Current Medications: reviewed with pt, pill planner provided.  Current Outpatient Prescriptions  Medication Sig Dispense Refill  . Aclidinium Bromide 400 MCG/ACT AEPB Inhale into the lungs. Pt to take twice a day.    . budesonide-formoterol (SYMBICORT) 160-4.5 MCG/ACT inhaler Inhale 2 puffs into the lungs 2 (two) times daily. 1 Inhaler 12  . DULoxetine (CYMBALTA) 60 MG capsule Take 60 mg by mouth every evening.    . escitalopram (LEXAPRO) 20 MG tablet Take 20 mg by mouth at bedtime.    Marland Kitchen etodolac (LODINE) 500 MG tablet Take 500 mg by mouth 2 (two) times daily.    Marland Kitchen oxyCODONE-acetaminophen (PERCOCET/ROXICET) 5-325 MG  per tablet Take 1-2 tablets by mouth every 6 (six) hours as needed for severe pain. 60 tablet 0  . polyethylene glycol powder (GLYCOLAX/MIRALAX) powder 1 cap full in a full glass of water, once a day for 5 days. 255 g 0  . ranitidine (ZANTAC) 150 MG tablet Take 150 mg by mouth 2 (two) times daily.    . simvastatin (ZOCOR) 40 MG tablet Take 40 mg by mouth every evening.     . traMADol (ULTRAM) 50 MG tablet Take 50 mg by mouth every 6 (six) hours as needed.    . zolpidem (AMBIEN) 5 MG tablet Take 5 mg by mouth at bedtime as needed for sleep.    Marland Kitchen albuterol (PROVENTIL) (2.5 MG/3ML) 0.083% nebulizer solution Inhale 3 mLs (2.5 mg total) into the lungs every 6 (six) hours as needed for wheezing or shortness of breath. (Patient not taking: Reported on 06/28/2014) 75 mL 12  . clonazePAM (KLONOPIN) 1 MG tablet Take 1 tablet (1 mg total) by mouth 3 (three) times daily as needed (anxiety). (Patient not taking: Reported on 06/28/2014) 30 tablet 0  . docusate sodium (COLACE) 100 MG capsule Take 2 capsules (200 mg total) by mouth 2 (two) times daily. (Patient not taking: Reported on 06/28/2014) 120 capsule 0  . ondansetron (ZOFRAN) 8 MG tablet Take 1 tablet (8 mg total) by mouth 2 (  two) times daily. Start the day after chemo for 2 days. Then take as needed for nausea or vomiting. (Patient not taking: Reported on 06/28/2014) 30 tablet 1  . pramoxine (PROCTOFOAM) 1 % foam Place 1 application rectally 3 (three) times daily as needed for itching. (Patient not taking: Reported on 06/28/2014) 15 g 0  . senna (SENOKOT) 8.6 MG TABS tablet Take 2 tablets (17.2 mg total) by mouth 2 (two) times daily. (Patient not taking: Reported on 06/28/2014) 120 each 0  . tiotropium (SPIRIVA) 18 MCG inhalation capsule Place 18 mcg into inhaler and inhale every morning.     Addison Lank Hazel (TUCKS) 50 % PADS Apply 1 application topically every 6 (six) hours. (Patient not taking: Reported on 06/28/2014) 40 each 0   No current facility-administered  medications for this visit.   Facility-Administered Medications Ordered in Other Visits  Medication Dose Route Frequency Provider Last Rate Last Dose  . 0.9 %  sodium chloride infusion   Intravenous Once Lequita Asal, MD      . ondansetron (ZOFRAN) 8 mg, dexamethasone (DECADRON) 10 mg in sodium chloride 0.9 % 50 mL IVPB   Intravenous Once Lequita Asal, MD        Functional Status:  In your present state of health, do you have any difficulty performing the following activities: 06/28/2014 06/12/2014  Hearing? N -  Vision? N -  Difficulty concentrating or making decisions? N -  Walking or climbing stairs? Y -  Dressing or bathing? Y -  Doing errands, shopping? Y -  Conservation officer, nature and eating ? - Y  Using the Toilet? - N  In the past six months, have you accidently leaked urine? - Y  Do you have problems with loss of bowel control? Y N  Managing your Medications? - N  Managing your Finances? - N  Housekeeping or managing your Housekeeping? - Y    Fall/Depression Screening: PHQ 2/9 Scores 06/12/2014  PHQ - 2 Score 2    Assessment:  Pt lying in bed upon arrival- unable to sit up  up due to pain in anal area +6 on pain scale.  Colostomy intact, pt did not want RN CM to assess anal area (reported leakage, skin red, painful).  Nutrition- meals from [facility]  Not received yet.   Medication issues- pt has several meds at pharmacy ready for pickup, cannot afford.  COPD- pt reports more sob using Tudorza than when using Spiriva (rx at pharmacy, needs pickup).                              Plan:  Pt called [employer] during home visit, f/u on meals (10 frozen), was told delivered 6/23.            Pt to call [place]- cancel today's radiation treatment, start tomorrow.            Pt to call [employer], inquire about mail order pharmacy             RN CM to make referral to [facility] about assistance with pt's meds            RN CM following pt  for Transition of care-   Completed 6/24 (31 days post discharge)            RN CM to provide pt with community nurse case management services (self management of COPD)-  Once transition of care completed, next home visit 7/21.               RN CM to inform Dr. Humphrey Rolls of [facility] involvement.    [name]   [place] Management  [phone]               [facility] CM Care Plan Problem One        Patient Outreach from 06/12/2014 in [place] Problem One  assistance with ADL's   Care Plan for Problem One  Active   [facility] Long Term Goal (31-90 days)  Patient will have personal care assistance within 90 days   [facility] Long Term Goal Start Date  06/12/14   Interventions for Problem One Long Term Goal  Personal care request form faxed to patient's provider   [facility] CM Short Term Goal #1 (0-30 days)  patient will have additional colostomy supplies ordered within 2 weeks   [facility] CM Short Term Goal #1 Start Date  06/12/14   Interventions for Short Term Goal #1  obtained phone number for [facility] [phone] re-ordering proces discussed  [patient will need to call and re-order with doctor's full name and her insurance information]    [facility] CM Care Plan Problem Two        Patient Outreach from 06/28/2014 in [place] Problem Two  Recent hospitalization- surgery to remove anal cancer    Care Plan for Problem Two  Active   [facility] CM Short Term Goal #1 (0-30 days)  Pt would not readmit 31 days from last hospital discharge 5/24.    [facility] CM Short Term Goal #1 Start Date  06/28/14   Interventions for Short Term Goal #2   -- [Discussed with pt importance of nutrition post discharge ( to receive radiation, chemo).  Pt to receive 10 meals from [name], expected date 6/23. ]   [facility] CM Short Term Goal #2 (0-30 days)  Pt would be able to purchase needed meds from pharmacy in the next 2 days    [facility] CM Short Term Goal #2 Start Date  06/28/14   Interventions for Short Term Goal #2  --  [With pt having difficulty getting meds from local pharmacy (affording, transportation) discussed  doing the mail order through [place].  Also, RN CM plans to do pharmacy referral. ]   [facility] CM Short Term Goal #3 (0-30 days)  Pt would see decrease in bleeding, inflammation in anal area in the next 7 days    [facility] CM Short Term Goal #3 Start Date  06/28/14   Interventions for Short Term Goal #3  Discussed with pt use of Tucks (once obtained from pharmacy)- help with inflammation.  Also, pt to start radiation tomorrow- MD  told after 5 days of radiation- shrinkage of tumor/stop bleeding.

## 2014-06-28 NOTE — Patient Outreach (Signed)
I received a phone call from Kathie Rhodes, RN, nurse care manager in regards to Ms. Yaun not being able to afford her medications.  She currently has [employer] and Medicaid and her medications are costing her $17 currently.  Due to her financial restrictions and her potential for hospitalization, Ms. Pell does meet the criteria to use the Pharmacy Emergency Fund.  I will reach out to her to determine when I can deliver the medications to her.   Deanne Coffer, PharmD, [place] [phone]

## 2014-06-29 ENCOUNTER — Ambulatory Visit
Admission: RE | Admit: 2014-06-29 | Discharge: 2014-06-29 | Disposition: A | Discharge status: Home / Self Care | Payer: Commercial Managed Care - HMO | Source: Ambulatory Visit | Attending: Radiation Oncology | Admitting: Radiation Oncology

## 2014-06-29 ENCOUNTER — Inpatient Hospital Stay: Payer: Commercial Managed Care - HMO

## 2014-06-29 VITALS — BP 121/76 | HR 73 | Resp 20

## 2014-06-29 DIAGNOSIS — Z5111 Encounter for antineoplastic chemotherapy: Secondary | ICD-10-CM | POA: Diagnosis not present

## 2014-06-29 DIAGNOSIS — Z510 Encounter for antineoplastic radiation therapy: Secondary | ICD-10-CM | POA: Diagnosis not present

## 2014-06-29 DIAGNOSIS — C210 Malignant neoplasm of anus, unspecified: Secondary | ICD-10-CM

## 2014-06-29 MED ORDER — MITOMYCIN CHEMO IV INJECTION 40 MG
10.0000 mg/m2 | Freq: Once | INTRAVENOUS | Status: AC
  Administered 2014-06-29: 20.5 mg via INTRAVENOUS
  Filled 2014-06-29: qty 41

## 2014-06-29 MED ORDER — SODIUM CHLORIDE 0.9 % IV SOLN
Freq: Once | INTRAVENOUS | Status: AC
  Administered 2014-06-29: 13:00:00 via INTRAVENOUS
  Filled 2014-06-29: qty 4

## 2014-06-29 MED ORDER — SODIUM CHLORIDE 0.9 % IV SOLN
1000.0000 mg/m2/d | INTRAVENOUS | Status: DC
  Administered 2014-06-29: 8100 mg via INTRAVENOUS
  Filled 2014-06-29: qty 162

## 2014-06-29 MED ORDER — SODIUM CHLORIDE 0.9 % IV SOLN
Freq: Once | INTRAVENOUS | Status: AC
  Administered 2014-06-29: 12:00:00 via INTRAVENOUS
  Filled 2014-06-29: qty 1000

## 2014-06-29 MED ORDER — HEPARIN SOD (PORK) LOCK FLUSH 100 UNIT/ML IV SOLN
500.0000 [IU] | Freq: Once | INTRAVENOUS | Status: DC | PRN
  Filled 2014-06-29: qty 5

## 2014-06-29 NOTE — Progress Notes (Signed)
Oncology Nurse Navigator Documentation  Oncology Nurse Navigator Flowsheets 06/29/2014  Navigator Encounter Type -  Patient Visit Type -  Treatment Phase First Chemo Tx  Barriers/Navigation Needs -  Specialty Items/DME -  Time Spent with Patient 30   Pt reported that she was very anxious yesterday and had a near panic attack about coming for her chemo/xrt. She feels much better today since arriving and receiving first xrt treatment.

## 2014-06-30 ENCOUNTER — Inpatient Hospital Stay: Payer: Commercial Managed Care - HMO

## 2014-06-30 ENCOUNTER — Other Ambulatory Visit: Payer: Self-pay | Admitting: *Deleted

## 2014-06-30 ENCOUNTER — Ambulatory Visit
Admission: RE | Admit: 2014-06-30 | Discharge: 2014-06-30 | Disposition: A | Discharge status: Home / Self Care | Payer: Commercial Managed Care - HMO | Source: Ambulatory Visit | Attending: Radiation Oncology | Admitting: Radiation Oncology

## 2014-06-30 DIAGNOSIS — C210 Malignant neoplasm of anus, unspecified: Secondary | ICD-10-CM

## 2014-06-30 DIAGNOSIS — Z510 Encounter for antineoplastic radiation therapy: Secondary | ICD-10-CM | POA: Diagnosis not present

## 2014-06-30 MED ORDER — OXYCODONE-ACETAMINOPHEN 5-325 MG PO TABS: 1.0000 | ORAL_TABLET | Freq: Four times a day (QID) | ORAL | Status: DC | PRN

## 2014-07-02 ENCOUNTER — Ambulatory Visit: Payer: Commercial Managed Care - HMO

## 2014-07-02 MED ORDER — SODIUM CHLORIDE 0.9 % IJ SOLN
10.0000 mL | INTRAMUSCULAR | Status: AC | PRN
  Filled 2014-07-02: qty 10

## 2014-07-02 MED ORDER — HEPARIN SOD (PORK) LOCK FLUSH 100 UNIT/ML IV SOLN: 500.0000 [IU] | Freq: Once | INTRAVENOUS | Status: AC

## 2014-07-03 ENCOUNTER — Other Ambulatory Visit: Payer: Self-pay | Admitting: Pharmacist

## 2014-07-03 ENCOUNTER — Inpatient Hospital Stay: Payer: Commercial Managed Care - HMO

## 2014-07-03 ENCOUNTER — Ambulatory Visit
Admission: RE | Admit: 2014-07-03 | Discharge: 2014-07-03 | Disposition: A | Discharge status: Home / Self Care | Payer: Commercial Managed Care - HMO | Source: Ambulatory Visit | Attending: Radiation Oncology | Admitting: Radiation Oncology

## 2014-07-03 DIAGNOSIS — Z510 Encounter for antineoplastic radiation therapy: Secondary | ICD-10-CM | POA: Diagnosis not present

## 2014-07-03 DIAGNOSIS — Z5111 Encounter for antineoplastic chemotherapy: Secondary | ICD-10-CM | POA: Diagnosis not present

## 2014-07-03 DIAGNOSIS — C210 Malignant neoplasm of anus, unspecified: Secondary | ICD-10-CM

## 2014-07-03 MED ORDER — HEPARIN SOD (PORK) LOCK FLUSH 100 UNIT/ML IV SOLN
500.0000 [IU] | Freq: Once | INTRAVENOUS | Status: AC | PRN
  Administered 2014-07-03: 500 [IU]
  Filled 2014-07-03: qty 5

## 2014-07-03 MED ORDER — SODIUM CHLORIDE 0.9 % IJ SOLN
10.0000 mL | INTRAMUSCULAR | Status: DC | PRN
  Administered 2014-07-03: 10 mL
  Filled 2014-07-03: qty 10

## 2014-07-03 NOTE — Patient Outreach (Signed)
Called Ms. Forrer to follow up about assisting with her medications.  I had to leave a HIPPA compliant message for her to call me back.  If I do not hear from her today, I will reach out again on 07/05/14.  Harlow Asa, PharmD Clinical Pharmacist [place] Management [phone]

## 2014-07-04 ENCOUNTER — Inpatient Hospital Stay: Payer: Commercial Managed Care - HMO

## 2014-07-04 ENCOUNTER — Inpatient Hospital Stay (HOSPITAL_BASED_OUTPATIENT_CLINIC_OR_DEPARTMENT_OTHER): Payer: Commercial Managed Care - HMO | Admitting: Hematology and Oncology

## 2014-07-04 ENCOUNTER — Ambulatory Visit
Admission: RE | Admit: 2014-07-04 | Discharge: 2014-07-04 | Disposition: A | Discharge status: Home / Self Care | Payer: Commercial Managed Care - HMO | Source: Ambulatory Visit | Attending: Radiation Oncology | Admitting: Radiation Oncology

## 2014-07-04 ENCOUNTER — Other Ambulatory Visit: Payer: Self-pay

## 2014-07-04 DIAGNOSIS — J45909 Unspecified asthma, uncomplicated: Secondary | ICD-10-CM

## 2014-07-04 DIAGNOSIS — I10 Essential (primary) hypertension: Secondary | ICD-10-CM

## 2014-07-04 DIAGNOSIS — Z510 Encounter for antineoplastic radiation therapy: Secondary | ICD-10-CM | POA: Diagnosis not present

## 2014-07-04 DIAGNOSIS — Z86718 Personal history of other venous thrombosis and embolism: Secondary | ICD-10-CM

## 2014-07-04 DIAGNOSIS — K1370 Unspecified lesions of oral mucosa: Secondary | ICD-10-CM | POA: Diagnosis not present

## 2014-07-04 DIAGNOSIS — C210 Malignant neoplasm of anus, unspecified: Secondary | ICD-10-CM

## 2014-07-04 DIAGNOSIS — Z79899 Other long term (current) drug therapy: Secondary | ICD-10-CM | POA: Diagnosis not present

## 2014-07-04 DIAGNOSIS — F319 Bipolar disorder, unspecified: Secondary | ICD-10-CM

## 2014-07-04 DIAGNOSIS — E039 Hypothyroidism, unspecified: Secondary | ICD-10-CM

## 2014-07-04 DIAGNOSIS — F4310 Post-traumatic stress disorder, unspecified: Secondary | ICD-10-CM

## 2014-07-04 DIAGNOSIS — M5136 Other intervertebral disc degeneration, lumbar region: Secondary | ICD-10-CM

## 2014-07-04 DIAGNOSIS — F17210 Nicotine dependence, cigarettes, uncomplicated: Secondary | ICD-10-CM

## 2014-07-04 DIAGNOSIS — F419 Anxiety disorder, unspecified: Secondary | ICD-10-CM

## 2014-07-04 DIAGNOSIS — K219 Gastro-esophageal reflux disease without esophagitis: Secondary | ICD-10-CM

## 2014-07-04 DIAGNOSIS — F209 Schizophrenia, unspecified: Secondary | ICD-10-CM

## 2014-07-04 DIAGNOSIS — G629 Polyneuropathy, unspecified: Secondary | ICD-10-CM

## 2014-07-04 DIAGNOSIS — E041 Nontoxic single thyroid nodule: Secondary | ICD-10-CM

## 2014-07-04 DIAGNOSIS — J449 Chronic obstructive pulmonary disease, unspecified: Secondary | ICD-10-CM

## 2014-07-04 DIAGNOSIS — M797 Fibromyalgia: Secondary | ICD-10-CM

## 2014-07-04 DIAGNOSIS — Z5111 Encounter for antineoplastic chemotherapy: Secondary | ICD-10-CM | POA: Diagnosis not present

## 2014-07-04 DIAGNOSIS — R634 Abnormal weight loss: Secondary | ICD-10-CM

## 2014-07-04 DIAGNOSIS — Z933 Colostomy status: Secondary | ICD-10-CM

## 2014-07-04 DIAGNOSIS — R918 Other nonspecific abnormal finding of lung field: Secondary | ICD-10-CM

## 2014-07-04 LAB — CBC WITH DIFFERENTIAL/PLATELET
Basophils Absolute: 0 10*3/uL (ref 0–0.1)
Basophils Relative: 0 %
Eosinophils Absolute: 0.4 10*3/uL (ref 0–0.7)
Eosinophils Relative: 4 %
HCT: 38.8 % (ref 35.0–47.0)
Hemoglobin: 12.7 g/dL (ref 12.0–16.0)
Lymphocytes Relative: 10 %
Lymphs Abs: 0.9 10*3/uL — ABNORMAL LOW (ref 1.0–3.6)
MCH: 30.3 pg (ref 26.0–34.0)
MCHC: 32.8 g/dL (ref 32.0–36.0)
MCV: 92.2 fL (ref 80.0–100.0)
Monocytes Absolute: 0.1 10*3/uL — ABNORMAL LOW (ref 0.2–0.9)
Monocytes Relative: 1 %
Neutro Abs: 8.4 10*3/uL — ABNORMAL HIGH (ref 1.4–6.5)
Neutrophils Relative %: 85 %
Platelets: 278 10*3/uL (ref 150–440)
RBC: 4.2 MIL/uL (ref 3.80–5.20)
RDW: 14.6 % — ABNORMAL HIGH (ref 11.5–14.5)
WBC: 9.8 10*3/uL (ref 3.6–11.0)

## 2014-07-04 LAB — COMPREHENSIVE METABOLIC PANEL
ALT: 13 U/L — ABNORMAL LOW (ref 14–54)
AST: 25 U/L (ref 15–41)
Albumin: 2.3 g/dL — ABNORMAL LOW (ref 3.5–5.0)
Alkaline Phosphatase: 87 U/L (ref 38–126)
Anion gap: 7 (ref 5–15)
BUN: 12 mg/dL (ref 6–20)
CO2: 25 mmol/L (ref 22–32)
Calcium: 7.7 mg/dL — ABNORMAL LOW (ref 8.9–10.3)
Chloride: 99 mmol/L — ABNORMAL LOW (ref 101–111)
Creatinine, Ser: 0.59 mg/dL (ref 0.44–1.00)
GFR calc Af Amer: >60 mL/min (ref >60)
GFR calc non Af Amer: >60 mL/min (ref >60)
Glucose, Bld: 122 mg/dL — ABNORMAL HIGH (ref 65–99)
Potassium: 4 mmol/L (ref 3.5–5.1)
Sodium: 131 mmol/L — ABNORMAL LOW (ref 135–145)
Total Bilirubin: 0.3 mg/dL (ref 0.3–1.2)
Total Protein: 6.4 g/dL — ABNORMAL LOW (ref 6.5–8.1)

## 2014-07-04 MED ORDER — MAGIC MOUTHWASH: 5.0000 mL | Freq: Four times a day (QID) | ORAL | Status: DC

## 2014-07-04 NOTE — Progress Notes (Signed)
[facility] day:  07/04/2014  Chief Complaint: [name] is a 59 y.o. 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  HPI:  The patient was last seen in the medical oncology clinic on 06/27/2014.  She did not return on 06/28/2014 for treatment.  She returned on 06/29/2014 for chemotherapy and initiation of radiation.  Pump disconnect for her continuous infusion 5FU was on 07/03/2014.  Symptomatically, she has felt good.  She denies any increased weakness.  She is eating well.  She denies any change in stool consistency or output.   She is using salt on her food secondary to a prior history of low sodium.  She notes a "few raw places in my mouth" and "sore on the crack of the lip".  Past Medical History  Diagnosis Date  . Schizophrenia   . Asthma   . GERD (gastroesophageal reflux disease)   . Anxiety   . Depression   . Bipolar disorder   . COPD (chronic obstructive pulmonary disease)   . Occasional tremors     right hand  . PTSD (post-traumatic stress disorder)   . Shortness of breath dyspnea   . Fibromyalgia   . DVT (deep venous thrombosis) 2011    RUE  . Thyroid nodule   . DDD (degenerative disc disease), lumbar   . Spinal stenosis   . Peripheral neuropathy   . Rotator cuff tear     right  . Pneumonia 2011  . Hypothyroidism     no meds currently  . Anemia     during pregnancy only  . Hypertension     Off meds x 15 years-well controlled now per pt  . Squamous cell cancer, anus   . Severe obstructive sleep apnea 06/27/2014    Past Surgical History  Procedure Laterality Date  . Foot surgery Right   . Tubal ligation    . Eye surgery Bilateral   . Mouth surgery  2002  . Rectal biopsy N/A 05/08/2014    Procedure: BIOPSY RECTAL;  Surgeon: Marlyce Huge, MD;  Location: [facility] ORS;  Service: General;  Laterality: N/A;  .  Evaluation under anesthesia with hemorrhoidectomy N/A 05/08/2014    Procedure: EXAM UNDER ANESTHESIA WITH HEMORRHOIDECTOMY;  Surgeon: Marlyce Huge, MD;  Location: [facility] ORS;  Service: General;  Laterality: N/A;  . Portacath placement N/A 05/20/2014    Procedure: INSERTION PORT-A-CATH;  Surgeon: Florene Glen, MD;  Location: [facility] ORS;  Service: General;  Laterality: N/A;  . Laparoscopic diverted colostomy N/A 05/26/2014    Procedure: LAPAROSCOPIC DIVERTED COLOSTOMY;  Surgeon: Marlyce Huge, MD;  Location: [facility] ORS;  Service: General;  Laterality: N/A;    Family History  Problem Relation Age of Onset  . Cancer Maternal Aunt   . Cancer Paternal Uncle     Social History:  reports that she has been smoking Cigarettes.  She has a 11 pack-year smoking history. She has never used smokeless tobacco. She reports that she drinks alcohol. She reports that she does not use illicit drugs.  The patient is accompanied by her friend, Nilda Calamity.  Allergies:  Allergies  Allergen Reactions  . Sulfa Antibiotics Hives    Current Medications: Current Outpatient Prescriptions  Medication Sig Dispense Refill  . Aclidinium Bromide 400 MCG/ACT AEPB Inhale into the lungs. Pt to take twice a day.    . clonazePAM (KLONOPIN) 1 MG tablet Take  1 tablet (1 mg total) by mouth 3 (three) times daily as needed (anxiety). 30 tablet 0  . docusate sodium (COLACE) 100 MG capsule Take 2 capsules (200 mg total) by mouth 2 (two) times daily. 120 capsule 0  . DULoxetine (CYMBALTA) 60 MG capsule Take 60 mg by mouth every evening.    . escitalopram (LEXAPRO) 20 MG tablet Take 20 mg by mouth at bedtime.    Marland Kitchen etodolac (LODINE) 500 MG tablet Take 500 mg by mouth 2 (two) times daily.    . ondansetron (ZOFRAN) 8 MG tablet Take 1 tablet (8 mg total) by mouth 2 (two) times daily. Start the day after chemo for 2 days. Then take as needed for nausea or vomiting. 30 tablet 1  . oxyCODONE-acetaminophen (PERCOCET/ROXICET) 5-325  MG per tablet Take 1-2 tablets by mouth every 6 (six) hours as needed for severe pain. 60 tablet 0  . polyethylene glycol powder (GLYCOLAX/MIRALAX) powder 1 cap full in a full glass of water, once a day for 5 days. 255 g 0  . pramoxine (PROCTOFOAM) 1 % foam Place 1 application rectally 3 (three) times daily as needed for itching. 15 g 0  . ranitidine (ZANTAC) 150 MG tablet Take 150 mg by mouth 2 (two) times daily.    Marland Kitchen senna (SENOKOT) 8.6 MG TABS tablet Take 2 tablets (17.2 mg total) by mouth 2 (two) times daily. 120 each 0  . simvastatin (ZOCOR) 40 MG tablet Take 40 mg by mouth every evening.     . tiotropium (SPIRIVA) 18 MCG inhalation capsule Place 18 mcg into inhaler and inhale every morning.     . traMADol (ULTRAM) 50 MG tablet Take 50 mg by mouth every 6 (six) hours as needed.    Addison Lank Hazel (TUCKS) 50 % PADS Apply 1 application topically every 6 (six) hours. 40 each 0  . zolpidem (AMBIEN) 5 MG tablet Take 5 mg by mouth at bedtime as needed for sleep.    Marland Kitchen albuterol (PROVENTIL) (2.5 MG/3ML) 0.083% nebulizer solution Inhale 3 mLs (2.5 mg total) into the lungs every 6 (six) hours as needed for wheezing or shortness of breath. (Patient not taking: Reported on 06/28/2014) 75 mL 12  . Alum & Mag Hydroxide-Simeth (MAGIC MOUTHWASH) SOLN Take 5 mLs by mouth 4 (four) times daily. 480 mL 3  . budesonide-formoterol (SYMBICORT) 160-4.5 MCG/ACT inhaler Inhale 2 puffs into the lungs 2 (two) times daily. 1 Inhaler 12   No current facility-administered medications for this visit.   Facility-Administered Medications Ordered in Other Visits  Medication Dose Route Frequency Provider Last Rate Last Dose  . heparin lock flush 100 unit/mL  500 Units Intravenous Once Lequita Asal, MD      . sodium chloride 0.9 % injection 10 mL  10 mL Intravenous PRN Lequita Asal, MD        Review of Systems:  GENERAL:  Feels "no weaker" than usual.  No fevers or sweats.  Weight stable. PERFORMANCE STATUS (ECOG):   2 HEENT:  Raw spot in mouth and corner of lip.  No visual changes, runny nose, or sore throat. Lungs: No shortness of breath or cough.  No hemoptysis. Cardiac:  No chest pain, palpitations, orthopnea, or PND. GI:  Colostomy.  Leakage from anus.  No nausea, vomiting, diarrhea, constipation, melena or hematochezia. GU:  No urgency, frequency, dysuria, or hematuria. Musculoskeletal:  No back pain.  No joint pain.  No muscle tenderness. Extremities:  No pain or swelling. Skin:  No rashes  or skin changes. Neuro:  No headache, numbness or weakness, balance or coordination issues. Endocrine:  No diabetes, thyroid issues, hot flashes or night sweats. Psych:  No mood changes or depression.  Anxiety improved since start of treatment. Pain:  Pain associated from anal mass. Review of systems:  All other systems reviewed and found to be negative.   Physical Exam: There were no vitals taken for this visit.  GENERAL:  Chronically ill appearing woman sitting in the exam room in no acute distress.  Room has a foul odor. MENTAL STATUS:  Alert and oriented to person, place and time. HEAD:  Pearline Cables hair.  Normocephalic, atraumatic, face symmetric, no Cushingoid features. EYES:  Blue eyes.  Pupils equal round and reactive to light and accomodation.  No conjunctivitis or scleral icterus. ENT:  No obvious mucositis.  Crack corner of right lip. Tongue normal. Mucous membranes moist.  RESPIRATORY:  Clear to auscultation without rales, wheezes or rhonchi. CARDIOVASCULAR:  Regular rate and rhythm without murmur, rub or gallop. ABDOMEN:  Soft, non-tender, with active bowel sounds, and no hepatosplenomegaly.  No masses. SKIN:  No rashes, ulcers or lesions. EXTREMITIES: No edema, no skin discoloration or tenderness.  No palpable cords. LYMPH NODES: No palpable cervical, supraclavicular, axillary or inguinal adenopathy  NEUROLOGICAL: Unremarkable. PSYCH:  Appropriate.   Appointment on 07/04/2014  Component Date Value  Ref Range Status  . WBC 07/04/2014 9.8  3.6 - 11.0 K/uL Final  . RBC 07/04/2014 4.20  3.80 - 5.20 MIL/uL Final  . Hemoglobin 07/04/2014 12.7  12.0 - 16.0 g/dL Final  . HCT 07/04/2014 38.8  35.0 - 47.0 % Final  . MCV 07/04/2014 92.2  80.0 - 100.0 fL Final  . MCH 07/04/2014 30.3  26.0 - 34.0 pg Final  . MCHC 07/04/2014 32.8  32.0 - 36.0 g/dL Final  . RDW 07/04/2014 14.6* 11.5 - 14.5 % Final  . Platelets 07/04/2014 278  150 - 440 K/uL Final  . Neutrophils Relative % 07/04/2014 85   Final  . Neutro Abs 07/04/2014 8.4* 1.4 - 6.5 K/uL Final  . Lymphocytes Relative 07/04/2014 10   Final  . Lymphs Abs 07/04/2014 0.9* 1.0 - 3.6 K/uL Final  . Monocytes Relative 07/04/2014 1   Final  . Monocytes Absolute 07/04/2014 0.1* 0.2 - 0.9 K/uL Final  . Eosinophils Relative 07/04/2014 4   Final  . Eosinophils Absolute 07/04/2014 0.4  0 - 0.7 K/uL Final  . Basophils Relative 07/04/2014 0   Final  . Basophils Absolute 07/04/2014 0.0  0 - 0.1 K/uL Final  . Sodium 07/04/2014 131* 135 - 145 mmol/L Final  . Potassium 07/04/2014 4.0  3.5 - 5.1 mmol/L Final  . Chloride 07/04/2014 99* 101 - 111 mmol/L Final  . CO2 07/04/2014 25  22 - 32 mmol/L Final  . Glucose, Bld 07/04/2014 122* 65 - 99 mg/dL Final  . BUN 07/04/2014 12  6 - 20 mg/dL Final  . Creatinine, Ser 07/04/2014 0.59  0.44 - 1.00 mg/dL Final  . Calcium 07/04/2014 7.7* 8.9 - 10.3 mg/dL Final  . Total Protein 07/04/2014 6.4* 6.5 - 8.1 g/dL Final  . Albumin 07/04/2014 2.3* 3.5 - 5.0 g/dL Final  . AST 07/04/2014 25  15 - 41 U/L Final  . ALT 07/04/2014 13* 14 - 54 U/L Final  . Alkaline Phosphatase 07/04/2014 87  38 - 126 U/L Final  . Total Bilirubin 07/04/2014 0.3  0.3 - 1.2 mg/dL Final  . GFR calc non Af Amer 07/04/2014 >60  >60  mL/min Final  . GFR calc Af Amer 07/04/2014 >60  >60 mL/min Final   Comment: (NOTE) The eGFR has been calculated using the CKD EPI equation. This calculation has not been validated in all clinical situations. eGFR's persistently  <60 mL/min signify possible Chronic Kidney Disease.   . Anion gap 07/04/2014 7  5 - 15 Final    Assessment:  [name] is a 58 y.o. 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  Chest, abdomen, and pelvic CT scan on 05/19/2014 revealed no evidence of metastatic disease.  There was a 4 mm RUL pulmonary nodule.  There were borderline (13 mm) enlarged iliac nodes.  There was a 4.7 x 4.1 x 6.0 cm lower rectal and anal mass consistent with anal cancer.  She underwent diverting colostomy on 05/26/2014.  She has ongoing anal leakage.    She is currently day 6 of cycle #1 concurrent chemotherapy (5FU and mitomycin-C) and radiation (began 06/29/2014).  She notes a little mouth tenderness.  Exam reveals no mucositis.  Plan: 1.  Labs today:  CBC with diff, BMP. 2.  Discuss management of nausea, mouth sores, and diarrhea. 3.  Rx:  Magic Mouthwash. 4.  Patient to pick up pain medication on Friday (07/07/2014) with brief check-in with nursing to assess any symptoms prior to the 3 day holiday weekend.  Remind patient to call if issues after hours or on the weekend. 5.  Patient to follow-up with [facility] on 08/01/2014. 6.  RTC in 1 week for MD assessment and labs (CBC with diff, BMP, Mg).   Lequita Asal, MD  07/04/2014, 11:20 PM

## 2014-07-04 NOTE — Progress Notes (Signed)
Pt here today for radiation and follow up; c/o mouth sores

## 2014-07-05 ENCOUNTER — Inpatient Hospital Stay: Payer: Commercial Managed Care - HMO

## 2014-07-05 ENCOUNTER — Other Ambulatory Visit: Payer: Self-pay | Admitting: Pharmacist

## 2014-07-05 ENCOUNTER — Ambulatory Visit
Admission: RE | Admit: 2014-07-05 | Discharge: 2014-07-05 | Disposition: A | Discharge status: Home / Self Care | Payer: Commercial Managed Care - HMO | Source: Ambulatory Visit | Attending: Radiation Oncology | Admitting: Radiation Oncology

## 2014-07-05 DIAGNOSIS — Z510 Encounter for antineoplastic radiation therapy: Secondary | ICD-10-CM | POA: Diagnosis not present

## 2014-07-05 NOTE — Patient Outreach (Signed)
Called and spoke with Ms. Quentin Cornwall about assistance for her Spiriva prescription. Patient reports that she will be able to pick up the Spiriva and Tucks pads, along with her other medications this Friday, as this is when she will have her disability check. Reports that the copay on her Spiriva is just over $3. Reports that she has enough samples of the Tudorza from her physician to last until Friday.  Reports that [employer] to be coming out to assess home and make determination about whether the patient is eligible for a hospital bed. Reports that she is still following up with them to set a date for this.  Verified that the patient has my contact information and asked her to reach out to me if she has any future medication questions or assistance needs.  Harlow Asa, PharmD Clinical Pharmacist [facility] [phone]

## 2014-07-06 ENCOUNTER — Ambulatory Visit: Payer: Commercial Managed Care - HMO

## 2014-07-06 ENCOUNTER — Inpatient Hospital Stay: Payer: Commercial Managed Care - HMO

## 2014-07-07 ENCOUNTER — Ambulatory Visit
Admission: RE | Admit: 2014-07-07 | Discharge: 2014-07-07 | Disposition: A | Discharge status: Home / Self Care | Payer: Commercial Managed Care - HMO | Source: Ambulatory Visit | Attending: Radiation Oncology | Admitting: Radiation Oncology

## 2014-07-07 ENCOUNTER — Other Ambulatory Visit: Payer: Self-pay | Admitting: *Deleted

## 2014-07-07 ENCOUNTER — Inpatient Hospital Stay: Payer: Commercial Managed Care - HMO | Attending: Hematology and Oncology

## 2014-07-07 ENCOUNTER — Encounter: Payer: Self-pay | Admitting: *Deleted

## 2014-07-07 ENCOUNTER — Inpatient Hospital Stay: Payer: Commercial Managed Care - HMO

## 2014-07-07 DIAGNOSIS — M797 Fibromyalgia: Secondary | ICD-10-CM | POA: Insufficient documentation

## 2014-07-07 DIAGNOSIS — Z86718 Personal history of other venous thrombosis and embolism: Secondary | ICD-10-CM | POA: Insufficient documentation

## 2014-07-07 DIAGNOSIS — F209 Schizophrenia, unspecified: Secondary | ICD-10-CM | POA: Insufficient documentation

## 2014-07-07 DIAGNOSIS — E876 Hypokalemia: Secondary | ICD-10-CM | POA: Insufficient documentation

## 2014-07-07 DIAGNOSIS — F4310 Post-traumatic stress disorder, unspecified: Secondary | ICD-10-CM | POA: Insufficient documentation

## 2014-07-07 DIAGNOSIS — R0602 Shortness of breath: Secondary | ICD-10-CM | POA: Insufficient documentation

## 2014-07-07 DIAGNOSIS — M5136 Other intervertebral disc degeneration, lumbar region: Secondary | ICD-10-CM | POA: Insufficient documentation

## 2014-07-07 DIAGNOSIS — F17210 Nicotine dependence, cigarettes, uncomplicated: Secondary | ICD-10-CM | POA: Insufficient documentation

## 2014-07-07 DIAGNOSIS — Z8701 Personal history of pneumonia (recurrent): Secondary | ICD-10-CM | POA: Insufficient documentation

## 2014-07-07 DIAGNOSIS — F419 Anxiety disorder, unspecified: Secondary | ICD-10-CM | POA: Insufficient documentation

## 2014-07-07 DIAGNOSIS — Z809 Family history of malignant neoplasm, unspecified: Secondary | ICD-10-CM | POA: Insufficient documentation

## 2014-07-07 DIAGNOSIS — D649 Anemia, unspecified: Secondary | ICD-10-CM | POA: Insufficient documentation

## 2014-07-07 DIAGNOSIS — F329 Major depressive disorder, single episode, unspecified: Secondary | ICD-10-CM | POA: Insufficient documentation

## 2014-07-07 DIAGNOSIS — C210 Malignant neoplasm of anus, unspecified: Secondary | ICD-10-CM | POA: Insufficient documentation

## 2014-07-07 DIAGNOSIS — J449 Chronic obstructive pulmonary disease, unspecified: Secondary | ICD-10-CM | POA: Insufficient documentation

## 2014-07-07 DIAGNOSIS — J45909 Unspecified asthma, uncomplicated: Secondary | ICD-10-CM | POA: Insufficient documentation

## 2014-07-07 DIAGNOSIS — E039 Hypothyroidism, unspecified: Secondary | ICD-10-CM | POA: Insufficient documentation

## 2014-07-07 DIAGNOSIS — G629 Polyneuropathy, unspecified: Secondary | ICD-10-CM | POA: Insufficient documentation

## 2014-07-07 DIAGNOSIS — Z79899 Other long term (current) drug therapy: Secondary | ICD-10-CM | POA: Insufficient documentation

## 2014-07-07 DIAGNOSIS — F319 Bipolar disorder, unspecified: Secondary | ICD-10-CM | POA: Insufficient documentation

## 2014-07-07 DIAGNOSIS — R911 Solitary pulmonary nodule: Secondary | ICD-10-CM | POA: Insufficient documentation

## 2014-07-07 DIAGNOSIS — K219 Gastro-esophageal reflux disease without esophagitis: Secondary | ICD-10-CM | POA: Insufficient documentation

## 2014-07-07 DIAGNOSIS — I10 Essential (primary) hypertension: Secondary | ICD-10-CM | POA: Insufficient documentation

## 2014-07-07 DIAGNOSIS — Z510 Encounter for antineoplastic radiation therapy: Secondary | ICD-10-CM | POA: Diagnosis not present

## 2014-07-07 MED ORDER — MAGIC MOUTHWASH: 5.0000 mL | Freq: Four times a day (QID) | ORAL | Status: DC

## 2014-07-07 MED ORDER — OXYCODONE-ACETAMINOPHEN 5-325 MG PO TABS: 1.0000 | ORAL_TABLET | Freq: Four times a day (QID) | ORAL | Status: DC | PRN

## 2014-07-07 NOTE — Patient Outreach (Signed)
[facility]) Care Management  07/07/2014  [name] July 09, 1955 [id number]   Documentation encounter:  Transition of care program closed- documented end date for 6/24-  no readmission 31 days post discharge.  Long and short term goals addressed.  Plan to call pt today to see if bleeding in rectal area subsided as was told radiation would help.  Also plan  to continue to follow pt with community nurse case management services (self management of COPD, medication compliance), next home visit 7/21.     [name]   [facility]  [phone]

## 2014-07-07 NOTE — Patient Outreach (Signed)
Attempt made to call pt, check on status/address short term goal started 6/22- improvement seen in rectal area (bleeding)- pt told radiation will help.    HIPPA compliant voice message left with contact number.  Plan to f/u with pt on 7/21, next scheduled home visit.     [name]   [facility]  [phone]

## 2014-07-10 ENCOUNTER — Inpatient Hospital Stay: Payer: Commercial Managed Care - HMO

## 2014-07-10 ENCOUNTER — Encounter: Payer: Self-pay | Admitting: Hematology and Oncology

## 2014-07-11 ENCOUNTER — Ambulatory Visit: Payer: Commercial Managed Care - HMO | Admitting: Radiation Oncology

## 2014-07-11 ENCOUNTER — Inpatient Hospital Stay: Payer: Commercial Managed Care - HMO

## 2014-07-11 ENCOUNTER — Inpatient Hospital Stay: Payer: Commercial Managed Care - HMO | Attending: Hematology and Oncology

## 2014-07-11 ENCOUNTER — Inpatient Hospital Stay: Payer: Commercial Managed Care - HMO | Admitting: Hematology and Oncology

## 2014-07-11 ENCOUNTER — Ambulatory Visit
Admission: RE | Admit: 2014-07-11 | Discharge: 2014-07-11 | Disposition: A | Discharge status: Home / Self Care | Payer: Commercial Managed Care - HMO | Source: Ambulatory Visit | Attending: Radiation Oncology | Admitting: Radiation Oncology

## 2014-07-11 ENCOUNTER — Ambulatory Visit: Payer: Commercial Managed Care - HMO

## 2014-07-12 ENCOUNTER — Inpatient Hospital Stay: Payer: Commercial Managed Care - HMO

## 2014-07-12 ENCOUNTER — Ambulatory Visit: Payer: Commercial Managed Care - HMO

## 2014-07-13 ENCOUNTER — Inpatient Hospital Stay: Payer: Commercial Managed Care - HMO

## 2014-07-13 ENCOUNTER — Ambulatory Visit
Admission: RE | Admit: 2014-07-13 | Discharge: 2014-07-13 | Disposition: A | Discharge status: Home / Self Care | Payer: Commercial Managed Care - HMO | Source: Ambulatory Visit | Attending: Radiation Oncology | Admitting: Radiation Oncology

## 2014-07-13 DIAGNOSIS — Z510 Encounter for antineoplastic radiation therapy: Secondary | ICD-10-CM | POA: Diagnosis not present

## 2014-07-14 ENCOUNTER — Telehealth: Payer: Self-pay

## 2014-07-14 ENCOUNTER — Inpatient Hospital Stay: Payer: Commercial Managed Care - HMO

## 2014-07-14 ENCOUNTER — Other Ambulatory Visit: Payer: Self-pay | Admitting: Hematology and Oncology

## 2014-07-14 ENCOUNTER — Ambulatory Visit
Admission: RE | Admit: 2014-07-14 | Discharge: 2014-07-14 | Disposition: A | Discharge status: Home / Self Care | Payer: Commercial Managed Care - HMO | Source: Ambulatory Visit | Attending: Radiation Oncology | Admitting: Radiation Oncology

## 2014-07-14 ENCOUNTER — Other Ambulatory Visit: Payer: Self-pay

## 2014-07-14 DIAGNOSIS — Z510 Encounter for antineoplastic radiation therapy: Secondary | ICD-10-CM | POA: Diagnosis not present

## 2014-07-14 DIAGNOSIS — C210 Malignant neoplasm of anus, unspecified: Secondary | ICD-10-CM

## 2014-07-14 MED ORDER — DOCUSATE SODIUM 100 MG PO CAPS: 200.0000 mg | ORAL_CAPSULE | Freq: Two times a day (BID) | ORAL | Status: DC

## 2014-07-14 MED ORDER — OXYCODONE-ACETAMINOPHEN 10-325 MG PO TABS: 1.0000 | ORAL_TABLET | Freq: Four times a day (QID) | ORAL | Status: DC | PRN

## 2014-07-14 NOTE — Telephone Encounter (Signed)
Prescription given to pt for Acetominophen-oxycodone 325-10 mg one tablet every 6 hours; pt verbalized understanding of only taking one table every 6 hours; pt scheduling follow up appointment next week for lab/MD

## 2014-07-14 NOTE — Telephone Encounter (Signed)
Sent rx for Colace to preferred pharmacy and left pt a voicemail to call us back regarding her pain medication

## 2014-07-17 ENCOUNTER — Inpatient Hospital Stay: Payer: Commercial Managed Care - HMO

## 2014-07-17 ENCOUNTER — Ambulatory Visit
Admission: RE | Admit: 2014-07-17 | Discharge: 2014-07-17 | Disposition: A | Discharge status: Home / Self Care | Payer: Commercial Managed Care - HMO | Source: Ambulatory Visit | Attending: Radiation Oncology | Admitting: Radiation Oncology

## 2014-07-17 DIAGNOSIS — Z510 Encounter for antineoplastic radiation therapy: Secondary | ICD-10-CM | POA: Diagnosis not present

## 2014-07-18 ENCOUNTER — Inpatient Hospital Stay: Payer: Commercial Managed Care - HMO

## 2014-07-18 ENCOUNTER — Ambulatory Visit
Admission: RE | Admit: 2014-07-18 | Discharge: 2014-07-18 | Disposition: A | Discharge status: Home / Self Care | Payer: Commercial Managed Care - HMO | Source: Ambulatory Visit | Attending: Radiation Oncology | Admitting: Radiation Oncology

## 2014-07-18 DIAGNOSIS — Z510 Encounter for antineoplastic radiation therapy: Secondary | ICD-10-CM | POA: Diagnosis not present

## 2014-07-19 ENCOUNTER — Ambulatory Visit
Admission: RE | Admit: 2014-07-19 | Discharge: 2014-07-19 | Disposition: A | Discharge status: Home / Self Care | Payer: Commercial Managed Care - HMO | Source: Ambulatory Visit | Attending: Radiation Oncology | Admitting: Radiation Oncology

## 2014-07-19 ENCOUNTER — Inpatient Hospital Stay: Payer: Commercial Managed Care - HMO

## 2014-07-19 DIAGNOSIS — Z510 Encounter for antineoplastic radiation therapy: Secondary | ICD-10-CM | POA: Diagnosis not present

## 2014-07-20 ENCOUNTER — Inpatient Hospital Stay: Payer: Commercial Managed Care - HMO

## 2014-07-20 ENCOUNTER — Ambulatory Visit
Admission: RE | Admit: 2014-07-20 | Discharge: 2014-07-20 | Disposition: A | Discharge status: Home / Self Care | Payer: Commercial Managed Care - HMO | Source: Ambulatory Visit | Attending: Radiation Oncology | Admitting: Radiation Oncology

## 2014-07-20 ENCOUNTER — Encounter: Payer: Self-pay | Admitting: *Deleted

## 2014-07-20 ENCOUNTER — Telehealth: Payer: Self-pay

## 2014-07-20 ENCOUNTER — Inpatient Hospital Stay (HOSPITAL_BASED_OUTPATIENT_CLINIC_OR_DEPARTMENT_OTHER): Payer: Commercial Managed Care - HMO | Admitting: Hematology and Oncology

## 2014-07-20 VITALS — BP 128/78 | HR 94 | Temp 98.8°F | Ht 68.0 in | Wt 192.7 lb

## 2014-07-20 DIAGNOSIS — K219 Gastro-esophageal reflux disease without esophagitis: Secondary | ICD-10-CM | POA: Diagnosis not present

## 2014-07-20 DIAGNOSIS — J45909 Unspecified asthma, uncomplicated: Secondary | ICD-10-CM | POA: Diagnosis not present

## 2014-07-20 DIAGNOSIS — E876 Hypokalemia: Secondary | ICD-10-CM | POA: Diagnosis not present

## 2014-07-20 DIAGNOSIS — R0602 Shortness of breath: Secondary | ICD-10-CM

## 2014-07-20 DIAGNOSIS — F319 Bipolar disorder, unspecified: Secondary | ICD-10-CM | POA: Diagnosis not present

## 2014-07-20 DIAGNOSIS — G629 Polyneuropathy, unspecified: Secondary | ICD-10-CM

## 2014-07-20 DIAGNOSIS — E039 Hypothyroidism, unspecified: Secondary | ICD-10-CM | POA: Diagnosis not present

## 2014-07-20 DIAGNOSIS — F209 Schizophrenia, unspecified: Secondary | ICD-10-CM

## 2014-07-20 DIAGNOSIS — F17210 Nicotine dependence, cigarettes, uncomplicated: Secondary | ICD-10-CM

## 2014-07-20 DIAGNOSIS — F329 Major depressive disorder, single episode, unspecified: Secondary | ICD-10-CM | POA: Diagnosis not present

## 2014-07-20 DIAGNOSIS — D649 Anemia, unspecified: Secondary | ICD-10-CM | POA: Diagnosis not present

## 2014-07-20 DIAGNOSIS — M797 Fibromyalgia: Secondary | ICD-10-CM

## 2014-07-20 DIAGNOSIS — F419 Anxiety disorder, unspecified: Secondary | ICD-10-CM | POA: Diagnosis not present

## 2014-07-20 DIAGNOSIS — Z86718 Personal history of other venous thrombosis and embolism: Secondary | ICD-10-CM | POA: Diagnosis not present

## 2014-07-20 DIAGNOSIS — F4310 Post-traumatic stress disorder, unspecified: Secondary | ICD-10-CM | POA: Diagnosis not present

## 2014-07-20 DIAGNOSIS — Z809 Family history of malignant neoplasm, unspecified: Secondary | ICD-10-CM | POA: Diagnosis not present

## 2014-07-20 DIAGNOSIS — Z79899 Other long term (current) drug therapy: Secondary | ICD-10-CM

## 2014-07-20 DIAGNOSIS — M5136 Other intervertebral disc degeneration, lumbar region: Secondary | ICD-10-CM | POA: Diagnosis not present

## 2014-07-20 DIAGNOSIS — F449 Dissociative and conversion disorder, unspecified: Secondary | ICD-10-CM

## 2014-07-20 DIAGNOSIS — Z8701 Personal history of pneumonia (recurrent): Secondary | ICD-10-CM

## 2014-07-20 DIAGNOSIS — R911 Solitary pulmonary nodule: Secondary | ICD-10-CM | POA: Diagnosis not present

## 2014-07-20 DIAGNOSIS — I10 Essential (primary) hypertension: Secondary | ICD-10-CM | POA: Diagnosis not present

## 2014-07-20 DIAGNOSIS — J449 Chronic obstructive pulmonary disease, unspecified: Secondary | ICD-10-CM | POA: Diagnosis not present

## 2014-07-20 DIAGNOSIS — Z510 Encounter for antineoplastic radiation therapy: Secondary | ICD-10-CM | POA: Diagnosis not present

## 2014-07-20 DIAGNOSIS — C210 Malignant neoplasm of anus, unspecified: Secondary | ICD-10-CM

## 2014-07-20 DIAGNOSIS — Z5111 Encounter for antineoplastic chemotherapy: Secondary | ICD-10-CM | POA: Diagnosis present

## 2014-07-20 LAB — CBC WITH DIFFERENTIAL/PLATELET
Basophils Absolute: 0 10*3/uL (ref 0–0.1)
Basophils Relative: 1 %
Eosinophils Absolute: 0.1 10*3/uL (ref 0–0.7)
Eosinophils Relative: 2 %
HCT: 36.3 % (ref 35.0–47.0)
Hemoglobin: 11.9 g/dL — ABNORMAL LOW (ref 12.0–16.0)
Lymphocytes Relative: 17 %
Lymphs Abs: 0.9 10*3/uL — ABNORMAL LOW (ref 1.0–3.6)
MCH: 30.6 pg (ref 26.0–34.0)
MCHC: 32.9 g/dL (ref 32.0–36.0)
MCV: 93.1 fL (ref 80.0–100.0)
Monocytes Absolute: 0.3 10*3/uL (ref 0.2–0.9)
Monocytes Relative: 5 %
Neutro Abs: 4.1 10*3/uL (ref 1.4–6.5)
Neutrophils Relative %: 75 %
Platelets: 153 10*3/uL (ref 150–440)
RBC: 3.9 MIL/uL (ref 3.80–5.20)
RDW: 15.3 % — ABNORMAL HIGH (ref 11.5–14.5)
WBC: 5.4 10*3/uL (ref 3.6–11.0)

## 2014-07-20 LAB — BASIC METABOLIC PANEL
Anion gap: 4 — ABNORMAL LOW (ref 5–15)
BUN: 7 mg/dL (ref 6–20)
CO2: 27 mmol/L (ref 22–32)
Calcium: 7.8 mg/dL — ABNORMAL LOW (ref 8.9–10.3)
Chloride: 105 mmol/L (ref 101–111)
Creatinine, Ser: 0.68 mg/dL (ref 0.44–1.00)
GFR calc Af Amer: >60 mL/min (ref >60)
GFR calc non Af Amer: >60 mL/min (ref >60)
Glucose, Bld: 108 mg/dL — ABNORMAL HIGH (ref 65–99)
Potassium: 3 mmol/L — ABNORMAL LOW (ref 3.5–5.1)
Sodium: 136 mmol/L (ref 135–145)

## 2014-07-20 LAB — MAGNESIUM: Magnesium: 1.7 mg/dL (ref 1.7–2.4)

## 2014-07-20 MED ORDER — OXYCODONE-ACETAMINOPHEN 10-325 MG PO TABS: 1.0000 | ORAL_TABLET | Freq: Four times a day (QID) | ORAL | Status: DC | PRN

## 2014-07-20 MED ORDER — POTASSIUM CHLORIDE CRYS ER 10 MEQ PO TBCR: 10.0000 meq | EXTENDED_RELEASE_TABLET | Freq: Two times a day (BID) | ORAL | Status: DC

## 2014-07-20 NOTE — Progress Notes (Signed)
Pt here today for follow up and radiation for rectal cancer and labs; c/o rectal pain 8/10 with relief from oxycodone; pt states she thinks she has a yeast infection-statesvaginal itching and burning no discharge

## 2014-07-20 NOTE — Patient Outreach (Signed)
[facility]) Care Management  07/20/2014  HIKARI TRIPP 1955/06/09 [id number]   Initial assessment routed to patient's provider office.    [name] [facility] [phone]

## 2014-07-20 NOTE — Telephone Encounter (Signed)
Rx given to pt for Percocet 10-325 mg

## 2014-07-20 NOTE — Progress Notes (Signed)
[name] day:  07/20/2014  Chief Complaint: [name] is a 59 y.o. 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  HPI:  The patient was last seen in the medical oncology clinic on 07/04/2014.  At that time, she was day 6 of cycle #1 5FU and mitomycin-C with radiation.  She felt good.  She denied any change in stool consistency or output.   She was using salt on her food secondary to a prior history of low sodium.  She noted a "few raw places in my mouth" and "sore on the crack of the lip".  Exam revealed no mucositis.  During the interim, she is done well. She states that she is having less rectal leaking. Things are "slowing down". She described having 2 days of what looked like "colored water" output. She had been taking her potassium pills.  She states that she is missed radiation on 3 days (07/04, 07/05, and 07/06).  She states that she overdid it on 07/04 and was too weak to come in. She is drinking plenty of water and adding salt to her food.  She states that she is also taking calcium and vitamin D.  Past Medical History  Diagnosis Date  . Schizophrenia   . Asthma   . GERD (gastroesophageal reflux disease)   . Anxiety   . Depression   . Bipolar disorder   . COPD (chronic obstructive pulmonary disease)   . Occasional tremors     right hand  . PTSD (post-traumatic stress disorder)   . Shortness of breath dyspnea   . Fibromyalgia   . DVT (deep venous thrombosis) 2011    RUE  . Thyroid nodule   . DDD (degenerative disc disease), lumbar   . Spinal stenosis   . Peripheral neuropathy   . Rotator cuff tear     right  . Pneumonia 2011  . Hypothyroidism     no meds currently  . Anemia     during pregnancy only  . Hypertension     Off meds x 15 years-well controlled now per pt  . Squamous cell cancer, anus   . Severe  obstructive sleep apnea 06/27/2014    Past Surgical History  Procedure Laterality Date  . Foot surgery Right   . Tubal ligation    . Eye surgery Bilateral   . Mouth surgery  2002  . Rectal biopsy N/A 05/08/2014    Procedure: BIOPSY RECTAL;  Surgeon: Marlyce Huge, MD;  Location: [facility] ORS;  Service: General;  Laterality: N/A;  . Evaluation under anesthesia with hemorrhoidectomy N/A 05/08/2014    Procedure: EXAM UNDER ANESTHESIA WITH HEMORRHOIDECTOMY;  Surgeon: Marlyce Huge, MD;  Location: [facility] ORS;  Service: General;  Laterality: N/A;  . Portacath placement N/A 05/20/2014    Procedure: INSERTION PORT-A-CATH;  Surgeon: Florene Glen, MD;  Location: [facility] ORS;  Service: General;  Laterality: N/A;  . Laparoscopic diverted colostomy N/A 05/26/2014    Procedure: LAPAROSCOPIC DIVERTED COLOSTOMY;  Surgeon: Marlyce Huge, MD;  Location: [facility] ORS;  Service: General;  Laterality: N/A;    Family History  Problem Relation Age of Onset  . Cancer Maternal Aunt   . Cancer Paternal Uncle     Social History:  reports that she has been smoking Cigarettes.  She has a 11 pack-year smoking history. She has never used smokeless tobacco. She reports that she drinks  alcohol. She reports that she does not use illicit drugs.  The patient is accompanied by her friend, [name].  Allergies:  Allergies  Allergen Reactions  . Sulfa Antibiotics Hives    Current Medications: Current Outpatient Prescriptions  Medication Sig Dispense Refill  . Aclidinium Bromide 400 MCG/ACT AEPB Inhale into the lungs. Pt to take twice a day.    . albuterol (PROVENTIL) (2.5 MG/3ML) 0.083% nebulizer solution Inhale 3 mLs (2.5 mg total) into the lungs every 6 (six) hours as needed for wheezing or shortness of breath. (Patient not taking: Reported on 06/28/2014) 75 mL 12  . Alum & Mag Hydroxide-Simeth (MAGIC MOUTHWASH) SOLN Take 5 mLs by mouth 4 (four) times daily. 480 mL 3  . budesonide-formoterol (SYMBICORT)  160-4.5 MCG/ACT inhaler Inhale 2 puffs into the lungs 2 (two) times daily. 1 Inhaler 12  . clonazePAM (KLONOPIN) 1 MG tablet Take 1 tablet (1 mg total) by mouth 3 (three) times daily as needed (anxiety). 30 tablet 0  . docusate sodium (COLACE) 100 MG capsule Take 2 capsules (200 mg total) by mouth 2 (two) times daily. 120 capsule 0  . DULoxetine (CYMBALTA) 60 MG capsule Take 60 mg by mouth every evening.    . escitalopram (LEXAPRO) 20 MG tablet Take 20 mg by mouth at bedtime.    Marland Kitchen etodolac (LODINE) 500 MG tablet Take 500 mg by mouth 2 (two) times daily.    . ondansetron (ZOFRAN) 8 MG tablet Take 1 tablet (8 mg total) by mouth 2 (two) times daily. Start the day after chemo for 2 days. Then take as needed for nausea or vomiting. 30 tablet 1  . oxyCODONE-acetaminophen (PERCOCET) 10-325 MG per tablet Take 1 tablet by mouth every 6 (six) hours as needed for pain. 30 tablet 0  . oxyCODONE-acetaminophen (PERCOCET/ROXICET) 5-325 MG per tablet Take 1-2 tablets by mouth every 6 (six) hours as needed for severe pain. 60 tablet 0  . polyethylene glycol powder (GLYCOLAX/MIRALAX) powder 1 cap full in a full glass of water, once a day for 5 days. 255 g 0  . pramoxine (PROCTOFOAM) 1 % foam Place 1 application rectally 3 (three) times daily as needed for itching. 15 g 0  . ranitidine (ZANTAC) 150 MG tablet Take 150 mg by mouth 2 (two) times daily.    Marland Kitchen senna (SENOKOT) 8.6 MG TABS tablet Take 2 tablets (17.2 mg total) by mouth 2 (two) times daily. 120 each 0  . simvastatin (ZOCOR) 40 MG tablet Take 40 mg by mouth every evening.     . tiotropium (SPIRIVA) 18 MCG inhalation capsule Place 18 mcg into inhaler and inhale every morning.     . traMADol (ULTRAM) 50 MG tablet Take 50 mg by mouth every 6 (six) hours as needed.    Addison Lank Hazel (TUCKS) 50 % PADS Apply 1 application topically every 6 (six) hours. 40 each 0  . zolpidem (AMBIEN) 5 MG tablet Take 5 mg by mouth at bedtime as needed for sleep.     No current  facility-administered medications for this visit.   Facility-Administered Medications Ordered in Other Visits  Medication Dose Route Frequency Provider Last Rate Last Dose  . heparin lock flush 100 unit/mL  500 Units Intravenous Once Lequita Asal, MD      . sodium chloride 0.9 % injection 10 mL  10 mL Intravenous PRN Lequita Asal, MD        Review of Systems:  GENERAL:  Feels "ok".  No fevers or sweats.  Weight up 3 pounds. PERFORMANCE STATUS (ECOG):  2 HEENT:  No visual changes, runny nose, mouth sores or sore throat. Lungs: No shortness of breath or cough.  No hemoptysis. Cardiac:  No chest pain, palpitations, orthopnea, or PND. GI:  Colostomy.  Less leakage from anus.  No nausea, vomiting, diarrhea, constipation, melena or hematochezia. GU:  No urgency, frequency, dysuria, or hematuria. Musculoskeletal:  No back pain.  No joint pain.  No muscle tenderness. Extremities:  No pain or swelling. Skin:  No rashes or skin changes. Neuro:  No headache, numbness or weakness, balance or coordination issues. Endocrine:  No diabetes, thyroid issues, hot flashes or night sweats. Psych:  No mood changes or depression.  Anxiety improved since start of treatment. Pain:  Pain associated with anal mass. Review of systems:  All other systems reviewed and found to be negative.   Physical Exam: There were no vitals taken for this visit.  GENERAL:  Chronically ill appearing woman lying on her side in the exam room in no acute distress.  Room has a foul odor. MENTAL STATUS:  Alert and oriented to person, place and time. HEAD:  Pearline Cables hair.  Normocephalic, atraumatic, face symmetric, no Cushingoid features. EYES:  Blue eyes.  Pupils equal round and reactive to light and accomodation.  No conjunctivitis or scleral icterus. ENT: No oral lesions.  Tongue normal. Mucous membranes moist.  RESPIRATORY:  Clear to auscultation without rales, wheezes or rhonchi. CARDIOVASCULAR:  Regular rate and rhythm  without murmur, rub or gallop. ABDOMEN:  Soft, non-tender, with active bowel sounds, and no hepatosplenomegaly.  No masses. RECTUM:  Ruddy erythema secondary to radiation. SKIN:  No rashes, ulcers or lesions. EXTREMITIES: No edema, no skin discoloration or tenderness.  No palpable cords. LYMPH NODES: No palpable cervical, supraclavicular, axillary or inguinal adenopathy  NEUROLOGICAL: Unremarkable. PSYCH:  Appropriate.   Appointment on 07/20/2014  Component Date Value Ref Range Status  . WBC 07/20/2014 5.4  3.6 - 11.0 K/uL Final  . RBC 07/20/2014 3.90  3.80 - 5.20 MIL/uL Final  . Hemoglobin 07/20/2014 11.9* 12.0 - 16.0 g/dL Final  . HCT 07/20/2014 36.3  35.0 - 47.0 % Final  . MCV 07/20/2014 93.1  80.0 - 100.0 fL Final  . MCH 07/20/2014 30.6  26.0 - 34.0 pg Final  . MCHC 07/20/2014 32.9  32.0 - 36.0 g/dL Final  . RDW 07/20/2014 15.3* 11.5 - 14.5 % Final  . Platelets 07/20/2014 153  150 - 440 K/uL Final  . Neutrophils Relative % 07/20/2014 75   Final  . Neutro Abs 07/20/2014 4.1  1.4 - 6.5 K/uL Final  . Lymphocytes Relative 07/20/2014 17   Final  . Lymphs Abs 07/20/2014 0.9* 1.0 - 3.6 K/uL Final  . Monocytes Relative 07/20/2014 5   Final  . Monocytes Absolute 07/20/2014 0.3  0.2 - 0.9 K/uL Final  . Eosinophils Relative 07/20/2014 2   Final  . Eosinophils Absolute 07/20/2014 0.1  0 - 0.7 K/uL Final  . Basophils Relative 07/20/2014 1   Final  . Basophils Absolute 07/20/2014 0.0  0 - 0.1 K/uL Final  . Sodium 07/20/2014 136  135 - 145 mmol/L Final  . Potassium 07/20/2014 3.0* 3.5 - 5.1 mmol/L Final  . Chloride 07/20/2014 105  101 - 111 mmol/L Final  . CO2 07/20/2014 27  22 - 32 mmol/L Final  . Glucose, Bld 07/20/2014 108* 65 - 99 mg/dL Final  . BUN 07/20/2014 7  6 - 20 mg/dL Final  . Creatinine, Ser 07/20/2014  0.68  0.44 - 1.00 mg/dL Final  . Calcium 07/20/2014 7.8* 8.9 - 10.3 mg/dL Final  . GFR calc non Af Amer 07/20/2014 >60  >60 mL/min Final  . GFR calc Af Amer 07/20/2014 >60  >60  mL/min Final   Comment: (NOTE) The eGFR has been calculated using the CKD EPI equation. This calculation has not been validated in all clinical situations. eGFR's persistently <60 mL/min signify possible Chronic Kidney Disease.   . Anion gap 07/20/2014 4* 5 - 15 Final  . Magnesium 07/20/2014 1.7  1.7 - 2.4 mg/dL Final    Assessment:  MELI FALEY is a 59 y.o. 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  Chest, abdomen, and pelvic CT scan on 05/19/2014 revealed no evidence of metastatic disease.  There was a 4 mm RUL pulmonary nodule.  There were borderline (13 mm) enlarged iliac nodes.  There was a 4.7 x 4.1 x 6.0 cm lower rectal and anal mass consistent with anal cancer.  She underwent diverting colostomy on 05/26/2014.  She has ongoing anal leakage.    She is currently day 22 of cycle #1 concurrent chemotherapy (5FU and mitomycin-C) and radiation (began 06/29/2014).  She has missed 3 days of radiation (07/04-07/06/2014).  Anal leakage has decreased.  Potassium is low.  Plan: 1.  Labs today:  CBC with diff, BMP, Mg. 2.  Increase potassium to 10 mew po BID. 3.  Phone follow-up with Dr. Baruch Gouty re: start of cycle #2 (07/31/2014)- done. 4.  Refill pain medications. 5.  RTC in 1 week for BMP. 6.  RTC on 07/31/2014 for MD assessment, labs (CBC with diff, CMP, Mg), and cycle #2 5FU and mitomycin-C.   Lequita Asal, MD  07/20/2014, 3:15 PM

## 2014-07-21 ENCOUNTER — Ambulatory Visit
Admission: RE | Admit: 2014-07-21 | Discharge: 2014-07-21 | Disposition: A | Discharge status: Home / Self Care | Payer: Commercial Managed Care - HMO | Source: Ambulatory Visit | Attending: Radiation Oncology | Admitting: Radiation Oncology

## 2014-07-21 ENCOUNTER — Inpatient Hospital Stay: Payer: Commercial Managed Care - HMO

## 2014-07-21 ENCOUNTER — Other Ambulatory Visit: Payer: Self-pay | Admitting: *Deleted

## 2014-07-21 DIAGNOSIS — Z510 Encounter for antineoplastic radiation therapy: Secondary | ICD-10-CM | POA: Diagnosis not present

## 2014-07-21 NOTE — Patient Outreach (Signed)
[facility]) Care Management  07/21/2014  [name] August 29, 1955 [id number]   Phone call to patient to assess for continued social work needs and to discuss need for personal care services. HIPPA compliant voicemail left.    [name] [facility] Care Management [phone]

## 2014-07-24 ENCOUNTER — Inpatient Hospital Stay: Payer: Commercial Managed Care - HMO

## 2014-07-24 ENCOUNTER — Ambulatory Visit
Admission: RE | Admit: 2014-07-24 | Discharge: 2014-07-24 | Disposition: A | Discharge status: Home / Self Care | Payer: Commercial Managed Care - HMO | Source: Ambulatory Visit | Attending: Radiation Oncology | Admitting: Radiation Oncology

## 2014-07-24 ENCOUNTER — Other Ambulatory Visit: Payer: Self-pay

## 2014-07-24 ENCOUNTER — Encounter: Payer: Self-pay | Admitting: Emergency Medicine

## 2014-07-24 ENCOUNTER — Emergency Department: Payer: Commercial Managed Care - HMO

## 2014-07-24 ENCOUNTER — Other Ambulatory Visit: Payer: Self-pay | Admitting: Physician Assistant

## 2014-07-24 ENCOUNTER — Emergency Department
Admission: EM | Admit: 2014-07-24 | Discharge: 2014-07-24 | Disposition: A | Discharge status: Home / Self Care | Payer: Commercial Managed Care - HMO | Attending: Emergency Medicine | Admitting: Emergency Medicine

## 2014-07-24 DIAGNOSIS — I10 Essential (primary) hypertension: Secondary | ICD-10-CM | POA: Diagnosis not present

## 2014-07-24 DIAGNOSIS — L03211 Cellulitis of face: Secondary | ICD-10-CM | POA: Diagnosis not present

## 2014-07-24 DIAGNOSIS — Z510 Encounter for antineoplastic radiation therapy: Secondary | ICD-10-CM | POA: Diagnosis not present

## 2014-07-24 DIAGNOSIS — Z1231 Encounter for screening mammogram for malignant neoplasm of breast: Secondary | ICD-10-CM

## 2014-07-24 DIAGNOSIS — Z720 Tobacco use: Secondary | ICD-10-CM | POA: Diagnosis not present

## 2014-07-24 DIAGNOSIS — Z79899 Other long term (current) drug therapy: Secondary | ICD-10-CM | POA: Insufficient documentation

## 2014-07-24 DIAGNOSIS — R220 Localized swelling, mass and lump, head: Secondary | ICD-10-CM | POA: Diagnosis present

## 2014-07-24 LAB — CBC
HEMATOCRIT: 37.5 % (ref 35.0–47.0)
Hemoglobin: 12.4 g/dL (ref 12.0–16.0)
MCH: 31.1 pg (ref 26.0–34.0)
MCHC: 33.1 g/dL (ref 32.0–36.0)
MCV: 93.9 fL (ref 80.0–100.0)
Platelets: 377 10*3/uL (ref 150–440)
RBC: 4 MIL/uL (ref 3.80–5.20)
RDW: 16.9 % — AB (ref 11.5–14.5)
WBC: 6.1 10*3/uL (ref 3.6–11.0)

## 2014-07-24 LAB — BASIC METABOLIC PANEL
Anion gap: 5 (ref 5–15)
BUN: 6 mg/dL (ref 6–20)
CO2: 25 mmol/L (ref 22–32)
Calcium: 8 mg/dL — ABNORMAL LOW (ref 8.9–10.3)
Chloride: 105 mmol/L (ref 101–111)
Creatinine, Ser: 0.89 mg/dL (ref 0.44–1.00)
GFR calc Af Amer: >60 mL/min (ref >60)
GFR calc non Af Amer: >60 mL/min (ref >60)
GLUCOSE: 118 mg/dL — AB (ref 65–99)
Potassium: 3.6 mmol/L (ref 3.5–5.1)
Sodium: 135 mmol/L (ref 135–145)

## 2014-07-24 LAB — TROPONIN I

## 2014-07-24 MED ORDER — MORPHINE SULFATE 4 MG/ML IJ SOLN
4.0000 mg | Freq: Once | INTRAMUSCULAR | Status: AC
  Administered 2014-07-24: 4 mg via INTRAVENOUS
  Filled 2014-07-24: qty 1

## 2014-07-24 MED ORDER — CLINDAMYCIN HCL 150 MG PO CAPS
300.0000 mg | ORAL_CAPSULE | Freq: Once | ORAL | Status: AC
  Administered 2014-07-24: 300 mg via ORAL
  Filled 2014-07-24: qty 2

## 2014-07-24 MED ORDER — CLINDAMYCIN HCL 300 MG PO CAPS: 300.0000 mg | ORAL_CAPSULE | Freq: Three times a day (TID) | ORAL | Status: DC

## 2014-07-24 MED ORDER — ONDANSETRON HCL 4 MG/2ML IJ SOLN
4.0000 mg | Freq: Once | INTRAMUSCULAR | Status: AC
  Administered 2014-07-24: 4 mg via INTRAVENOUS
  Filled 2014-07-24: qty 2

## 2014-07-24 MED ORDER — IOHEXOL 300 MG/ML  SOLN
75.0000 mL | Freq: Once | INTRAMUSCULAR | Status: AC | PRN
  Administered 2014-07-24: 75 mL via INTRAVENOUS

## 2014-07-24 NOTE — ED Notes (Signed)
Tracy Mussel, RN called the Pharmacy to obtain the Pt's Clindamycin. Pharmacy said they would send the medication.

## 2014-07-24 NOTE — ED Notes (Signed)
Pt returned from CT °

## 2014-07-24 NOTE — ED Notes (Signed)
Pt seen [facility] nurses today, was told to see her oncologist, was unable to get appt until tomorrow, pt could not wait.   Pt had radiation Tx on 07/21/14. No airway compromise, pt speaking complete sentences w/o difficulty.  Pt amb to room w/o difficulty.

## 2014-07-24 NOTE — ED Notes (Signed)
Pt presents with left side swelling to behind ear since last Friday. Pt is currently be treated for rectal cancer. aslo reports coughing up yellow sputum and has chest pressure.

## 2014-07-24 NOTE — ED Notes (Signed)
Pt went to CT

## 2014-07-24 NOTE — ED Provider Notes (Signed)
[facility] Emergency Department Provider Note  Time seen: 7:16 PM  I have reviewed the triage vital signs and the nursing notes.   HISTORY  Chief Complaint Facial Swelling    HPI [name] is a 59 y.o. female with a complicated medical history including fibromyalgia, DVT, hypothyroidism, anemia, hypertension, anal cancer currently on chemotherapy and radiation who presents to the emergency department today for left face and neck pain and swelling. According to the patient she is currently undergoing radiation, her last chemotherapy was approximately one month ago. Patient is undergoing radiation to her rectum/anus. Today she noted swelling to her left neck and left face. She states it is quite tender, and showed up overnight. States it is not worsened throughout the day and fact it might have decreased slightly throughout the day. Denies any airway issues. Denies any trouble swallowing. Of note the patient also states for the past one week she has been coughing, with occasional yellow sputum production. Denies any fever. Patient denies any dental pain, or infections. Describes her neck/facial pain as moderate currently a 7-8/10.     Past Medical History  Diagnosis Date  . Schizophrenia   . Asthma   . GERD (gastroesophageal reflux disease)   . Anxiety   . Depression   . Bipolar disorder   . COPD (chronic obstructive pulmonary disease)   . Occasional tremors     right hand  . PTSD (post-traumatic stress disorder)   . Shortness of breath dyspnea   . Fibromyalgia   . DVT (deep venous thrombosis) 2011    RUE  . Thyroid nodule   . DDD (degenerative disc disease), lumbar   . Spinal stenosis   . Peripheral neuropathy   . Rotator cuff tear     right  . Pneumonia 2011  . Hypothyroidism     no meds currently  . Anemia     during pregnancy only  . Hypertension     Off meds x 15 years-well controlled now per pt  . Squamous cell cancer, anus   .  Severe obstructive sleep apnea 06/27/2014    Patient Active Problem List   Diagnosis Date Noted  . Hypokalemia 07/20/2014  . Anal cancer 06/27/2014  . Anal fissure 06/27/2014  . Anxiety 06/27/2014  . Airway hyperreactivity 06/27/2014  . H/O manic depressive disorder 06/27/2014  . CAFL (chronic airflow limitation) 06/27/2014  . Clinical depression 06/27/2014  . Deep vein thrombosis 06/27/2014  . External hemorrhoid 06/27/2014  . Myalgia and myositis 06/27/2014  . Acid reflux 06/27/2014  . Hemorrhoid 06/27/2014  . BP (high blood pressure) 06/27/2014  . HLD (hyperlipidemia) 06/27/2014  . Adult hypothyroidism 06/27/2014  . LBP (low back pain) 06/27/2014  . Mass of perianal area 06/27/2014  . External hemorrhoid, thrombosed 06/27/2014  . 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 Ulcerated hemorrhoid 06/27/2014  . Severe obstructive sleep apnea 06/27/2014  . Squamous cell cancer, anus   . Rectal ulcer 05/17/2014    Past Surgical History  Procedure Laterality Date  . Foot surgery Right   . Tubal ligation    . Eye surgery Bilateral   . Mouth surgery  2002  . Rectal biopsy N/A 05/08/2014    Procedure: BIOPSY RECTAL;  Surgeon: Marlyce Huge, MD;  Location: [facility] ORS;  Service: General;  Laterality: N/A;  . Evaluation under anesthesia with hemorrhoidectomy N/A 05/08/2014    Procedure: EXAM UNDER ANESTHESIA WITH HEMORRHOIDECTOMY;  Surgeon: Marlyce Huge, MD;  Location: [facility] ORS;  Service: General;  Laterality:  N/A;  . Portacath placement N/A 05/20/2014    Procedure: INSERTION PORT-A-CATH;  Surgeon: Florene Glen, MD;  Location: [facility] ORS;  Service: General;  Laterality: N/A;  . Laparoscopic diverted colostomy N/A 05/26/2014    Procedure: LAPAROSCOPIC DIVERTED COLOSTOMY;  Surgeon: Marlyce Huge, MD;  Location: [facility] ORS;  Service: General;  Laterality: N/A;    Current Outpatient Rx  Name  Route  Sig  Dispense  Refill  . Aclidinium Bromide 400 MCG/ACT AEPB   Inhalation    Inhale into the lungs. Pt to take twice a day.         . albuterol (PROVENTIL) (2.5 MG/3ML) 0.083% nebulizer solution   Inhalation   Inhale 3 mLs (2.5 mg total) into the lungs every 6 (six) hours as needed for wheezing or shortness of breath.   75 mL   12   . Alum & Mag Hydroxide-Simeth (MAGIC MOUTHWASH) SOLN   Oral   Take 5 mLs by mouth 4 (four) times daily. Patient not taking: Reported on 07/20/2014   480 mL   3     Swish and spit   . budesonide-formoterol (SYMBICORT) 160-4.5 MCG/ACT inhaler   Inhalation   Inhale 2 puffs into the lungs 2 (two) times daily.   1 Inhaler   12   . clonazePAM (KLONOPIN) 1 MG tablet   Oral   Take 1 tablet (1 mg total) by mouth 3 (three) times daily as needed (anxiety).   30 tablet   0   . docusate sodium (COLACE) 100 MG capsule   Oral   Take 2 capsules (200 mg total) by mouth 2 (two) times daily.   120 capsule   0   . DULoxetine (CYMBALTA) 60 MG capsule   Oral   Take 60 mg by mouth every evening.         . escitalopram (LEXAPRO) 10 MG tablet               . escitalopram (LEXAPRO) 20 MG tablet   Oral   Take 20 mg by mouth at bedtime.         Marland Kitchen etodolac (LODINE) 200 MG capsule               . etodolac (LODINE) 500 MG tablet   Oral   Take 500 mg by mouth 2 (two) times daily.         . ondansetron (ZOFRAN) 8 MG tablet   Oral   Take 1 tablet (8 mg total) by mouth 2 (two) times daily. Start the day after chemo for 2 days. Then take as needed for nausea or vomiting.   30 tablet   1   . oxybutynin (DITROPAN) 5 MG tablet               . oxyCODONE-acetaminophen (PERCOCET) 10-325 MG per tablet   Oral   Take 1 tablet by mouth every 6 (six) hours as needed for pain.   40 tablet   0   . oxyCODONE-acetaminophen (PERCOCET/ROXICET) 5-325 MG per tablet   Oral   Take 1-2 tablets by mouth every 6 (six) hours as needed for severe pain. Patient not taking: Reported on 07/20/2014   60 tablet   0   . polyethylene glycol  powder (GLYCOLAX/MIRALAX) powder      1 cap full in a full glass of water, once a day for 5 days.   255 g   0   . potassium chloride SA (K-DUR,KLOR-CON) 10 MEQ tablet   Oral  Take 1 tablet (10 mEq total) by mouth 2 (two) times daily. for 3 days then 1 pill a day   20 tablet   0   . pramoxine (PROCTOFOAM) 1 % foam   Rectal   Place 1 application rectally 3 (three) times daily as needed for itching.   15 g   0   . ranitidine (ZANTAC) 150 MG tablet   Oral   Take 150 mg by mouth 2 (two) times daily.         Marland Kitchen senna (SENOKOT) 8.6 MG TABS tablet   Oral   Take 2 tablets (17.2 mg total) by mouth 2 (two) times daily.   120 each   0   . simvastatin (ZOCOR) 40 MG tablet   Oral   Take 40 mg by mouth every evening.          . tiotropium (SPIRIVA) 18 MCG inhalation capsule   Inhalation   Place 18 mcg into inhaler and inhale every morning.          . traMADol (ULTRAM) 50 MG tablet   Oral   Take 50 mg by mouth every 6 (six) hours as needed.         Addison Lank Hazel (TUCKS) 50 % PADS   Apply externally   Apply 1 application topically every 6 (six) hours.   40 each   0   . zolpidem (AMBIEN) 5 MG tablet   Oral   Take 5 mg by mouth at bedtime as needed for sleep.           Allergies Sulfa antibiotics  Family History  Problem Relation Age of Onset  . Cancer Maternal Aunt   . Cancer Paternal Uncle     Social History History  Substance Use Topics  . Smoking status: Current Every Day Smoker -- 0.25 packs/day for 44 years    Types: Cigarettes  . Smokeless tobacco: Never Used  . Alcohol Use: Yes     Comment: 1 drink every 2-3 times/year    Review of Systems Constitutional: Negative for fever. ENT: Positive for left neck pain and swelling. Cardiovascular: Negative for chest pain. Respiratory: Negative for shortness of breath. Gastrointestinal: Negative for abdominal pain Neurological: Negative for headache 10-point ROS otherwise  negative.  ____________________________________________   PHYSICAL EXAM:  VITAL SIGNS: ED Triage Vitals  Enc Vitals Group     BP 07/24/14 1516 126/85 mmHg     Pulse Rate 07/24/14 1516 120     Resp 07/24/14 1516 20     Temp 07/24/14 1516 98.7 F (37.1 C)     Temp Source 07/24/14 1516 Oral     SpO2 07/24/14 1516 96 %     Weight 07/24/14 1516 190 lb (86.183 kg)     Height 07/24/14 1516 '5\' 8"'$  (1.727 m)     Head Cir --      Peak Flow --      Pain Score 07/24/14 1516 7     Pain Loc --      Pain Edu? --      Excl. in [place]? --     Constitutional: Alert and oriented. Well appearing and in no distress. Eyes: Normal exam ENT   Head: Mild left facial swelling, with tenderness over the parotid, and left submandibular region. With moderate swelling in the submandibular region. Floor of the mouth is soft. Cardiovascular: Normal rate, regular rhythm. No murmur Respiratory: Normal respiratory effort without tachypnea nor retractions. Breath sounds are clear Gastrointestinal: Soft and nontender.  No distention.   Musculoskeletal: Nontender with normal range of motion in all extremities. Neurologic:  Normal speech and language. No gross focal neurologic deficits Skin:  Skin is warm, dry and intact.  Psychiatric: Mood and affect are normal. Speech and behavior are normal.   ____________________________________________    EKG  EKG reviewed and interpreted by myself shows normal sinus rhythm at 99 bpm, narrow QRS, normal axis, normal intervals, no ST changes noted. Overall normal EKG.  ____________________________________________    RADIOLOGY  CT consistent with parotid inflammation versus cellulitis.  ____________________________________________   INITIAL IMPRESSION / ASSESSMENT AND PLAN / ED COURSE  Pertinent labs & imaging results that were available during my care of the patient were reviewed by me and considered in my medical decision making (see chart for  details).  Patient with moderate pain and swelling to the left face and left neck. Afebrile. Labs are largely within normal limits. We'll obtain a CT of the neck to help further evaluate. Suspect at this time either sial adenitis versus lymphadenopathy versus abscess. No sign of dental infection on exam.  CT consistent with parotid inflammation versus cellulitis. No abscess seen. We will start the patient on clindamycin 4 times a day for the next 10 days per patient's follow-up with her oncologist this week. Patient agreeable to plan.  ____________________________________________   FINAL CLINICAL IMPRESSION(S) / ED DIAGNOSES  Left facial swelling and pain Facial cellulitis   Harvest Dark, MD 07/24/14 2024

## 2014-07-24 NOTE — Discharge Instructions (Signed)
Cellulitis Cellulitis is an infection of the skin and the tissue under the skin. The infected area is usually red and tender. This happens most often in the arms and lower legs. HOME CARE   Take your antibiotic medicine as told. Finish the medicine even if you start to feel better.  Keep the infected arm or leg raised (elevated).  Put a warm cloth on the area up to 4 times per day.  Only take medicines as told by your doctor.  Keep all doctor visits as told. GET HELP IF:  You see red streaks on the skin coming from the infected area.  Your red area gets bigger or turns a dark color.  Your bone or joint under the infected area is painful after the skin heals.  Your infection comes back in the same area or different area.  You have a puffy (swollen) bump in the infected area.  You have new symptoms.  You have a fever. GET HELP RIGHT AWAY IF:   You feel very sleepy.  You throw up (vomit) or have watery poop (diarrhea).  You feel sick and have muscle aches and pains. MAKE SURE YOU:   Understand these instructions.  Will watch your condition.  Will get help right away if you are not doing well or get worse. Document Released: 06/11/2007 Document Revised: 05/09/2013 Document Reviewed: 03/10/2011 [facility] Patient Information 2015 [place], [place]. This information is not intended to replace advice given to you by your health care provider. Make sure you discuss any questions you have with your health care provider.     You have been seen in the emergency department for left face and neck swelling and pain. Your CT scan is most consistent with an early infection. Please take your antibiotics as prescribed. Please follow-up with your oncologist in the next 3 days for recheck. Return to the emergency department for any worsening swelling, redness, fever, trouble swallowing or breathing.

## 2014-07-25 ENCOUNTER — Ambulatory Visit: Payer: Commercial Managed Care - HMO | Attending: Radiation Oncology | Admitting: Radiation Oncology

## 2014-07-25 ENCOUNTER — Ambulatory Visit: Payer: Commercial Managed Care - HMO

## 2014-07-25 ENCOUNTER — Ambulatory Visit
Admission: RE | Admit: 2014-07-25 | Discharge: 2014-07-25 | Disposition: A | Discharge status: Home / Self Care | Payer: Commercial Managed Care - HMO | Source: Ambulatory Visit | Attending: Radiation Oncology | Admitting: Radiation Oncology

## 2014-07-25 ENCOUNTER — Inpatient Hospital Stay: Payer: Commercial Managed Care - HMO

## 2014-07-26 ENCOUNTER — Ambulatory Visit
Admission: RE | Admit: 2014-07-26 | Discharge: 2014-07-26 | Disposition: A | Discharge status: Home / Self Care | Payer: Commercial Managed Care - HMO | Source: Ambulatory Visit | Attending: Radiation Oncology | Admitting: Radiation Oncology

## 2014-07-26 ENCOUNTER — Inpatient Hospital Stay: Payer: Commercial Managed Care - HMO

## 2014-07-26 DIAGNOSIS — Z510 Encounter for antineoplastic radiation therapy: Secondary | ICD-10-CM | POA: Diagnosis not present

## 2014-07-27 ENCOUNTER — Other Ambulatory Visit: Payer: Self-pay

## 2014-07-27 ENCOUNTER — Inpatient Hospital Stay: Payer: Commercial Managed Care - HMO

## 2014-07-27 ENCOUNTER — Other Ambulatory Visit: Payer: Self-pay | Admitting: *Deleted

## 2014-07-27 ENCOUNTER — Encounter: Payer: Self-pay | Admitting: *Deleted

## 2014-07-27 ENCOUNTER — Telehealth: Payer: Self-pay

## 2014-07-27 ENCOUNTER — Ambulatory Visit
Admission: RE | Admit: 2014-07-27 | Discharge: 2014-07-27 | Disposition: A | Discharge status: Home / Self Care | Payer: Commercial Managed Care - HMO | Source: Ambulatory Visit | Attending: Radiation Oncology | Admitting: Radiation Oncology

## 2014-07-27 VITALS — BP 140/80 | HR 77 | Resp 20

## 2014-07-27 DIAGNOSIS — Z510 Encounter for antineoplastic radiation therapy: Secondary | ICD-10-CM | POA: Diagnosis not present

## 2014-07-27 DIAGNOSIS — J441 Chronic obstructive pulmonary disease with (acute) exacerbation: Secondary | ICD-10-CM

## 2014-07-27 MED ORDER — OXYCODONE-ACETAMINOPHEN 10-325 MG PO TABS: 1.0000 | ORAL_TABLET | Freq: Four times a day (QID) | ORAL | Status: DC | PRN

## 2014-07-27 NOTE — Telephone Encounter (Signed)
Pt told Ana, RN in radiation that she needed a refill of her pain medications; pt last received rx on 7/14; given 40 tablets and instructed pt she should not run out until next Monday; pt has appointment with Dr. Mike Gip on Monday and will just pick up rx then; she states she does have enough to last until MOnday; pt states she is only taking one tablet every 6 hours; rx shredded

## 2014-07-27 NOTE — Patient Outreach (Signed)
[place] [facility]) Care Management   07/27/2014  [name] [birth year] [id number]  [name] is an 59 y.o. female  Subjective: Pt reports to go to radiation today, goes 5 times a week, went to chemo 7/18.  Pt states she will be  Through with everything by 8/17.  Pt states she had an xray done yesterday, everything looked good.  Pt states no nausea/vomiting, gets weak at times.  Pt states she is gaining weight.  Pt reports on recent ED visit, told had facial cellulitis (left side of neck/face- started on Clindamycin.  Pt states she is also taking Cipro for a UTI.   Pt states she is to f/u with Leta Baptist PA to do a breathing test.  Pt states she has not received a hospital bed yet, to call [facility] as well as talk to Cancer MD 7/25.    Pt states MD is going to work with her on smoking cessation.   Objective:   Filed Vitals:   07/27/14 1146  BP: 140/80  Pulse: 77  Resp: 20    ROS  Physical Exam  Constitutional: She is oriented to person, place, and time. She appears well-developed and well-nourished.  Cardiovascular: Normal rate.   Respiratory: Breath sounds normal.  GI:  Colostomy   Neurological: She is alert and oriented to person, place, and time.  Psychiatric: She has a normal mood and affect. Her behavior is normal. Judgment and thought content normal.  colostomy and skin surrounding intact.   Current Medications:  Reviewed medications with pt during home visit.  Current Outpatient Prescriptions  Medication Sig Dispense Refill  . budesonide-formoterol (SYMBICORT) 160-4.5 MCG/ACT inhaler Inhale 2 puffs into the lungs 2 (two) times daily. 1 Inhaler 12  . Calcium Carb-Cholecalciferol (CALCIUM 600+D) 600-800 MG-UNIT TABS Take by mouth 2 (two) times daily.    . ciprofloxacin (CIPRO) 500 MG tablet Take 500 mg by mouth 2 (two) times daily.    . clindamycin (CLEOCIN) 300 MG capsule Take 1 capsule (300 mg total) by mouth 3 (three) times daily. 30 capsule 0  .  clonazePAM (KLONOPIN) 1 MG tablet Take 1 tablet (1 mg total) by mouth 3 (three) times daily as needed (anxiety). 30 tablet 0  . docusate sodium (COLACE) 100 MG capsule Take 2 capsules (200 mg total) by mouth 2 (two) times daily. 120 capsule 0  . DULoxetine (CYMBALTA) 60 MG capsule Take 60 mg by mouth every evening.    . escitalopram (LEXAPRO) 20 MG tablet Take 20 mg by mouth at bedtime.    Marland Kitchen etodolac (LODINE) 500 MG tablet Take 500 mg by mouth 2 (two) times daily.    Marland Kitchen oxybutynin (DITROPAN) 5 MG tablet     . oxyCODONE-acetaminophen (PERCOCET) 10-325 MG per tablet Take 1 tablet by mouth every 6 (six) hours as needed for pain. 40 tablet 0  . potassium chloride SA (K-DUR,KLOR-CON) 10 MEQ tablet Take 1 tablet (10 mEq total) by mouth 2 (two) times daily. for 3 days then 1 pill a day 20 tablet 0  . ranitidine (ZANTAC) 150 MG tablet Take 150 mg by mouth 2 (two) times daily.    Marland Kitchen senna (SENOKOT) 8.6 MG TABS tablet Take 2 tablets (17.2 mg total) by mouth 2 (two) times daily. 120 each 0  . simvastatin (ZOCOR) 40 MG tablet Take 40 mg by mouth every evening.     . tiotropium (SPIRIVA) 18 MCG inhalation capsule Place 18 mcg into inhaler and inhale every morning.     Marland Kitchen  zolpidem (AMBIEN) 5 MG tablet Take 5 mg by mouth at bedtime as needed for sleep.    . Aclidinium Bromide 400 MCG/ACT AEPB Inhale into the lungs. Pt to take twice a day.    . albuterol (PROVENTIL) (2.5 MG/3ML) 0.083% nebulizer solution Inhale 3 mLs (2.5 mg total) into the lungs every 6 (six) hours as needed for wheezing or shortness of breath. (Patient not taking: Reported on 07/27/2014) 75 mL 12  . Alum & Mag Hydroxide-Simeth (MAGIC MOUTHWASH) SOLN Take 5 mLs by mouth 4 (four) times daily. (Patient not taking: Reported on 07/20/2014) 480 mL 3  . escitalopram (LEXAPRO) 10 MG tablet     . etodolac (LODINE) 200 MG capsule     . ondansetron (ZOFRAN) 8 MG tablet Take 1 tablet (8 mg total) by mouth 2 (two) times daily. Start the day after chemo for 2  days. Then take as needed for nausea or vomiting. (Patient not taking: Reported on 07/27/2014) 30 tablet 1  . oxyCODONE-acetaminophen (PERCOCET/ROXICET) 5-325 MG per tablet Take 1-2 tablets by mouth every 6 (six) hours as needed for severe pain. (Patient not taking: Reported on 07/20/2014) 60 tablet 0  . polyethylene glycol powder (GLYCOLAX/MIRALAX) powder 1 cap full in a full glass of water, once a day for 5 days. (Patient not taking: Reported on 07/27/2014) 255 g 0  . pramoxine (PROCTOFOAM) 1 % foam Place 1 application rectally 3 (three) times daily as needed for itching. (Patient not taking: Reported on 07/27/2014) 15 g 0  . traMADol (ULTRAM) 50 MG tablet Take 50 mg by mouth every 6 (six) hours as needed.    Addison Lank Hazel (TUCKS) 50 % PADS Apply 1 application topically every 6 (six) hours. (Patient not taking: Reported on 07/27/2014) 40 each 0   No current facility-administered medications for this visit.   Facility-Administered Medications Ordered in Other Visits  Medication Dose Route Frequency Provider Last Rate Last Dose  . heparin lock flush 100 unit/mL  500 Units Intravenous Once Lequita Asal, MD      . sodium chloride 0.9 % injection 10 mL  10 mL Intravenous PRN Lequita Asal, MD        Functional Status:   In your present state of health, do you have any difficulty performing the following activities: 06/28/2014 06/12/2014  Hearing? N -  Vision? N -  Difficulty concentrating or making decisions? N -  Walking or climbing stairs? Y -  Dressing or bathing? Y -  Doing errands, shopping? Y -  Conservation officer, nature and eating ? - Y  Using the Toilet? - N  In the past six months, have you accidently leaked urine? - Y  Do you have problems with loss of bowel control? Y N  Managing your Medications? - N  Managing your Finances? - N  Housekeeping or managing your Housekeeping? - Y    Fall/Depression Screening:    PHQ 2/9 Scores 06/12/2014  PHQ - 2 Score 2    Assessment:  Upon arrival,  pt was lying in bed- scheduled for radiation today.                           COPD- pt reports more sob walking short distances.  Pt was smoking during part of home visit, reported  Cut down from 2 packs a day to one pack every 3 days.                               Plan:  Pt to f/u with Leta Baptist PA for breathing test.            Pt to call [facility] customer service today as well as talk with Cancer MD 7/25 about status of hospital bed            Pt 's goal to  cut down on cigarette smoking, plus to work with MD on smoking cessation.             RN CM to continue to provide community nurse case management services, next home visit 8/15.             Plan to inform Dr. Humphrey Rolls of [facility] involvement, include today's encounter.   [facility] CM Care Plan Problem One        Patient Outreach Telephone from 07/27/2014 in [place] Problem One  assistance with ADL's   Care Plan for Problem One  Active   [facility] Long Term Goal (31-90 days)  goal not met patient now declined need for personal care services   [facility] Long Term Goal Start Date  06/12/14   [facility] Long Term Goal Met Date  07/27/14   Interventions for Problem One Long Term Goal  Personal care request form faxed to patient's provider   [facility] CM Short Term Goal #1 (0-30 days)  patient will have additional colostomy supplies ordered within 2 weeks   [facility] CM Short Term Goal #1 Start Date  06/12/14   Interventions for Short Term Goal #1  obtained phone number for [facility] [phone] re-ordering proces discussed  [patient will need to call and re-order with doctor's full name and her insurance information]    [facility] CM Care Plan Problem Two        Documentation from 07/07/2014 in [place] Problem Two  Recent hospitalization- surgery to remove anal cancer    Care Plan for Problem Two  Not Active   [facility] CM Short Term Goal #1 (0-30 days)  Pt would not readmit 31 days from last hospital  discharge 5/24.    [facility] CM Short Term Goal #1 Start Date  06/28/14   [facility] CM Short Term Goal #1 Met Date   06/30/14 [pt did not readmit- discharged 31 days ago. ]   [facility] CM Short Term Goal #2 (0-30 days)  Pt would be able to purchase needed meds from pharmacy in the next 2 days    [facility] CM Short Term Goal #2 Start Date  06/28/14   [facility] CM Short Term Goal #2 Met Date  -- [not met- per Epic note per [facility] pharmacist- pt to pick up 7/1]   [facility] CM Short Term Goal #3 (0-30 days)  Pt would see decrease in bleeding, inflammation in anal area in the next 7 days    [facility] CM Short Term Goal #3 Start Date  06/28/14   [facility] CM Short Term Goal #3 Met Date  -- [plan to do f/u call with pt, check on status.]   Interventions for Short Term Goal #3  Discussed with pt use of Tucks (once obtained from pharmacy)- help with inflammation.  Also, pt to start radiation tomorrow- MD  told after 5 days  of radiation- shrinkage of tumor/stop bleeding.     [facility] CM Care Plan Problem Three        Patient Outreach from 07/27/2014 in [place] Problem Three  COPD- increased sob with exertion    Care Plan for Problem Three  Active   [facility] Long Term Goal (31-90) days  Better management of COPD in the next 60 days    [facility] Long Term Goal Start Date  07/27/14   Interventions for Problem Three Long Term Goal  Provided pt with COPD action plan 6/22, discussed yellow zone when to call MD, cutting down on smoking    [facility] CM Short Term Goal #1 (0-30 days)  Pt would be able to identify 2 symptoms/action in yellow zone of COPD action plan    [facility] CM Short Term Goal #1 Start Date  07/27/14   Interventions for Short Term Goal #1  Reviewed with pt yellow zone of COPD action plan.    [facility] CM Short Term Goal #2 (0-30 days)  Pt would cut down from 4-5 cigarettes a day to 3-4 cigarettes a day in the next 30 days    [facility] CM Short Term Goal #2 Start Date  07/27/14   Interventions for Short Term Goal #2  Discussed with pt the benefits of  smoking cessation :currently getting radiation/chemo for rectal cancer.

## 2014-07-27 NOTE — Patient Outreach (Signed)
[place] [facility]) Care Management  07/27/2014  [name] [birth year] [id number]   Phone call to patient to discuss patient's need for personal care services.  RNCM in the home during the visit with patient.  Patient discussed having radiation treatments 5 days per week.  Per patient, her treatment seems to be progressing well.  The leakage from her tumor seems to have slowed down.  Personal care services discussed.  Per patient, she is not in need of personal care services, however does need a suction grab bar to be used in the community bathroom that she shares at the boarding house that she lives in. Per patient, she has a very difficult time getting in and out of the tub/shower.  Patient discussed financial issues,  due to a combination of needed medical supplies and food cost.  Financial assessment completed, CSW to provide assistance with suction grab bar to assist with safety and to avoid falls.  Home visit scheduled for 08/01/14 at 11:30am to complete in home needs assessment.    [name] [facility] Care Management [phone]

## 2014-07-28 ENCOUNTER — Inpatient Hospital Stay: Payer: Commercial Managed Care - HMO

## 2014-07-28 ENCOUNTER — Ambulatory Visit: Payer: Commercial Managed Care - HMO

## 2014-07-30 ENCOUNTER — Encounter: Payer: Self-pay | Admitting: Hematology and Oncology

## 2014-07-31 ENCOUNTER — Inpatient Hospital Stay: Payer: Commercial Managed Care - HMO

## 2014-07-31 ENCOUNTER — Ambulatory Visit
Admission: RE | Admit: 2014-07-31 | Discharge: 2014-07-31 | Disposition: A | Discharge status: Home / Self Care | Payer: Commercial Managed Care - HMO | Source: Ambulatory Visit | Attending: Radiation Oncology | Admitting: Radiation Oncology

## 2014-07-31 ENCOUNTER — Ambulatory Visit: Payer: Commercial Managed Care - HMO

## 2014-07-31 ENCOUNTER — Other Ambulatory Visit: Payer: Self-pay

## 2014-07-31 ENCOUNTER — Inpatient Hospital Stay (HOSPITAL_BASED_OUTPATIENT_CLINIC_OR_DEPARTMENT_OTHER): Payer: Commercial Managed Care - HMO | Admitting: Hematology and Oncology

## 2014-07-31 ENCOUNTER — Encounter: Payer: Self-pay | Admitting: *Deleted

## 2014-07-31 VITALS — BP 155/97 | HR 114 | Temp 97.8°F | Resp 22 | Ht 68.5 in | Wt 184.6 lb

## 2014-07-31 DIAGNOSIS — R0602 Shortness of breath: Secondary | ICD-10-CM

## 2014-07-31 DIAGNOSIS — G629 Polyneuropathy, unspecified: Secondary | ICD-10-CM

## 2014-07-31 DIAGNOSIS — R911 Solitary pulmonary nodule: Secondary | ICD-10-CM | POA: Diagnosis not present

## 2014-07-31 DIAGNOSIS — Z510 Encounter for antineoplastic radiation therapy: Secondary | ICD-10-CM | POA: Diagnosis not present

## 2014-07-31 DIAGNOSIS — F209 Schizophrenia, unspecified: Secondary | ICD-10-CM

## 2014-07-31 DIAGNOSIS — E876 Hypokalemia: Secondary | ICD-10-CM

## 2014-07-31 DIAGNOSIS — I10 Essential (primary) hypertension: Secondary | ICD-10-CM

## 2014-07-31 DIAGNOSIS — C210 Malignant neoplasm of anus, unspecified: Secondary | ICD-10-CM | POA: Diagnosis not present

## 2014-07-31 DIAGNOSIS — M797 Fibromyalgia: Secondary | ICD-10-CM

## 2014-07-31 DIAGNOSIS — F319 Bipolar disorder, unspecified: Secondary | ICD-10-CM

## 2014-07-31 DIAGNOSIS — Z8701 Personal history of pneumonia (recurrent): Secondary | ICD-10-CM

## 2014-07-31 DIAGNOSIS — K219 Gastro-esophageal reflux disease without esophagitis: Secondary | ICD-10-CM

## 2014-07-31 DIAGNOSIS — F329 Major depressive disorder, single episode, unspecified: Secondary | ICD-10-CM

## 2014-07-31 DIAGNOSIS — F17210 Nicotine dependence, cigarettes, uncomplicated: Secondary | ICD-10-CM

## 2014-07-31 DIAGNOSIS — E039 Hypothyroidism, unspecified: Secondary | ICD-10-CM

## 2014-07-31 DIAGNOSIS — J45909 Unspecified asthma, uncomplicated: Secondary | ICD-10-CM

## 2014-07-31 DIAGNOSIS — Z79899 Other long term (current) drug therapy: Secondary | ICD-10-CM | POA: Diagnosis not present

## 2014-07-31 DIAGNOSIS — J449 Chronic obstructive pulmonary disease, unspecified: Secondary | ICD-10-CM

## 2014-07-31 DIAGNOSIS — F419 Anxiety disorder, unspecified: Secondary | ICD-10-CM

## 2014-07-31 DIAGNOSIS — D649 Anemia, unspecified: Secondary | ICD-10-CM

## 2014-07-31 DIAGNOSIS — M5136 Other intervertebral disc degeneration, lumbar region: Secondary | ICD-10-CM

## 2014-07-31 DIAGNOSIS — Z86718 Personal history of other venous thrombosis and embolism: Secondary | ICD-10-CM

## 2014-07-31 DIAGNOSIS — Z809 Family history of malignant neoplasm, unspecified: Secondary | ICD-10-CM

## 2014-07-31 DIAGNOSIS — F4310 Post-traumatic stress disorder, unspecified: Secondary | ICD-10-CM

## 2014-07-31 LAB — COMPREHENSIVE METABOLIC PANEL
ALT: 10 U/L — ABNORMAL LOW (ref 14–54)
AST: 28 U/L (ref 15–41)
Albumin: 2.8 g/dL — ABNORMAL LOW (ref 3.5–5.0)
Alkaline Phosphatase: 60 U/L (ref 38–126)
Anion gap: 5 (ref 5–15)
BUN: 6 mg/dL (ref 6–20)
CO2: 23 mmol/L (ref 22–32)
Calcium: 8.1 mg/dL — ABNORMAL LOW (ref 8.9–10.3)
Chloride: 106 mmol/L (ref 101–111)
Creatinine, Ser: 0.76 mg/dL (ref 0.44–1.00)
GFR calc Af Amer: >60 mL/min (ref >60)
GFR calc non Af Amer: >60 mL/min (ref >60)
Glucose, Bld: 110 mg/dL — ABNORMAL HIGH (ref 65–99)
Potassium: 3.8 mmol/L (ref 3.5–5.1)
Sodium: 134 mmol/L — ABNORMAL LOW (ref 135–145)
Total Bilirubin: 0.6 mg/dL (ref 0.3–1.2)
Total Protein: 6.7 g/dL (ref 6.5–8.1)

## 2014-07-31 LAB — CBC WITH DIFFERENTIAL/PLATELET
Basophils Absolute: 0 10*3/uL (ref 0–0.1)
Basophils Relative: 1 %
Eosinophils Absolute: 0.2 10*3/uL (ref 0–0.7)
Eosinophils Relative: 4 %
HCT: 38 % (ref 35.0–47.0)
Hemoglobin: 12.6 g/dL (ref 12.0–16.0)
Lymphocytes Relative: 22 %
Lymphs Abs: 1.2 10*3/uL (ref 1.0–3.6)
MCH: 31.4 pg (ref 26.0–34.0)
MCHC: 33.1 g/dL (ref 32.0–36.0)
MCV: 94.9 fL (ref 80.0–100.0)
Monocytes Absolute: 0.7 10*3/uL (ref 0.2–0.9)
Monocytes Relative: 13 %
Neutro Abs: 3.3 10*3/uL (ref 1.4–6.5)
Neutrophils Relative %: 60 %
Platelets: 288 10*3/uL (ref 150–440)
RBC: 4 MIL/uL (ref 3.80–5.20)
RDW: 19 % — ABNORMAL HIGH (ref 11.5–14.5)
WBC: 5.4 10*3/uL (ref 3.6–11.0)

## 2014-07-31 MED ORDER — MITOMYCIN CHEMO IV INJECTION 40 MG
10.0000 mg/m2 | Freq: Once | INTRAVENOUS | Status: DC
  Filled 2014-07-31: qty 41

## 2014-07-31 MED ORDER — DEXAMETHASONE SODIUM PHOSPHATE 100 MG/10ML IJ SOLN
Freq: Once | INTRAMUSCULAR | Status: AC
  Administered 2014-07-31: 16:00:00 via INTRAVENOUS
  Filled 2014-07-31: qty 4

## 2014-07-31 MED ORDER — MITOMYCIN CHEMO IV INJECTION 20 MG
20.0000 mg | Freq: Once | INTRAVENOUS | Status: AC
  Administered 2014-07-31: 20 mg via INTRAVENOUS
  Filled 2014-07-31: qty 40

## 2014-07-31 MED ORDER — OXYCODONE-ACETAMINOPHEN 10-325 MG PO TABS: 1.0000 | ORAL_TABLET | Freq: Four times a day (QID) | ORAL | Status: DC | PRN

## 2014-07-31 MED ORDER — SODIUM CHLORIDE 0.9 % IJ SOLN
10.0000 mL | INTRAMUSCULAR | Status: DC | PRN
  Administered 2014-07-31: 10 mL
  Filled 2014-07-31: qty 10

## 2014-07-31 MED ORDER — HEPARIN SOD (PORK) LOCK FLUSH 100 UNIT/ML IV SOLN: 500.0000 [IU] | Freq: Once | INTRAVENOUS | Status: DC | PRN

## 2014-07-31 MED ORDER — SODIUM CHLORIDE 0.9 % IV SOLN
1000.0000 mg/m2/d | INTRAVENOUS | Status: DC
  Administered 2014-07-31: 8100 mg via INTRAVENOUS
  Filled 2014-07-31: qty 162

## 2014-07-31 MED ORDER — SODIUM CHLORIDE 0.9 % IV SOLN
Freq: Once | INTRAVENOUS | Status: AC
  Administered 2014-07-31: 16:00:00 via INTRAVENOUS
  Filled 2014-07-31: qty 1000

## 2014-07-31 NOTE — Progress Notes (Signed)
[facility] day:  07/31/2014  Chief Complaint: [name] is a 59 y.o. 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  HPI:  The patient was last seen in the medical oncology clinic on 07/20/2014.  At that time, she was day 22 of cycle #1 5FU and mitomycin-C with radiation.  She had missed 3 days of radiation (07/04-07/06/2014).  Anal leakage had decreased.  Potassium was low.  Potassium was increased to 10 meq BID.  We discussed initiation of cycle #2 on 07/31/2014.  During the interim, she notes less anal leakage. She notes shrinkage of the tumor. She notes being "raw back there". She has some sores on her "butt cheek". She is taking her potassium.  Her last radiation fraction is on 08/23/2014.  Past Medical History  Diagnosis Date  . Schizophrenia   . Asthma   . GERD (gastroesophageal reflux disease)   . Anxiety   . Depression   . Bipolar disorder   . COPD (chronic obstructive pulmonary disease)   . Occasional tremors     right hand  . PTSD (post-traumatic stress disorder)   . Shortness of breath dyspnea   . Fibromyalgia   . DVT (deep venous thrombosis) 2011    RUE  . Thyroid nodule   . DDD (degenerative disc disease), lumbar   . Spinal stenosis   . Peripheral neuropathy   . Rotator cuff tear     right  . Pneumonia 2011  . Hypothyroidism     no meds currently  . Anemia     during pregnancy only  . Hypertension     Off meds x 15 years-well controlled now per pt  . Squamous cell cancer, anus   . Severe obstructive sleep apnea 06/27/2014    Past Surgical History  Procedure Laterality Date  . Foot surgery Right   . Tubal ligation    . Eye surgery Bilateral   . Mouth surgery  2002  . Rectal biopsy N/A 05/08/2014    Procedure: BIOPSY RECTAL;  Surgeon: Marlyce Huge, MD;  Location: [facility] ORS;  Service: General;   Laterality: N/A;  . Evaluation under anesthesia with hemorrhoidectomy N/A 05/08/2014    Procedure: EXAM UNDER ANESTHESIA WITH HEMORRHOIDECTOMY;  Surgeon: Marlyce Huge, MD;  Location: [facility] ORS;  Service: General;  Laterality: N/A;  . Portacath placement N/A 05/20/2014    Procedure: INSERTION PORT-A-CATH;  Surgeon: Florene Glen, MD;  Location: [facility] ORS;  Service: General;  Laterality: N/A;  . Laparoscopic diverted colostomy N/A 05/26/2014    Procedure: LAPAROSCOPIC DIVERTED COLOSTOMY;  Surgeon: Marlyce Huge, MD;  Location: [facility] ORS;  Service: General;  Laterality: N/A;    Family History  Problem Relation Age of Onset  . Cancer Maternal Aunt   . Cancer Paternal Uncle     Social History:  reports that she has been smoking Cigarettes.  She has a 11 pack-year smoking history. She has never used smokeless tobacco. She reports that she drinks alcohol. She reports that she does not use illicit drugs.  The patient is accompanied by her friend, [name].  Allergies:  Allergies  Allergen Reactions  . Sulfa Antibiotics Hives    Current Medications: Current Outpatient Prescriptions  Medication Sig Dispense Refill  . albuterol (PROVENTIL) (2.5 MG/3ML) 0.083% nebulizer solution Inhale 3 mLs (2.5 mg total) into the lungs every 6 (six) hours as  needed for wheezing or shortness of breath. (Patient not taking: Reported on 08/04/2014) 75 mL 12  . Alum & Mag Hydroxide-Simeth (MAGIC MOUTHWASH) SOLN Take 5 mLs by mouth 4 (four) times daily. (Patient not taking: Reported on 08/04/2014) 480 mL 3  . budesonide-formoterol (SYMBICORT) 160-4.5 MCG/ACT inhaler Inhale 2 puffs into the lungs 2 (two) times daily. 1 Inhaler 12  . Calcium Carb-Cholecalciferol (CALCIUM 600+D) 600-800 MG-UNIT TABS Take 1 tablet by mouth 2 (two) times daily.     Marland Kitchen docusate sodium (COLACE) 100 MG capsule Take 2 capsules (200 mg total) by mouth 2 (two) times daily. (Patient taking differently: Take 200 mg by mouth 2 (two)  times daily as needed for mild constipation. ) 120 capsule 0  . DULoxetine (CYMBALTA) 60 MG capsule Take 60 mg by mouth every evening.    . escitalopram (LEXAPRO) 20 MG tablet Take 20 mg by mouth at bedtime.    Marland Kitchen etodolac (LODINE) 500 MG tablet Take 500 mg by mouth 2 (two) times daily.    . ondansetron (ZOFRAN) 8 MG tablet Take 1 tablet (8 mg total) by mouth 2 (two) times daily. Start the day after chemo for 2 days. Then take as needed for nausea or vomiting. (Patient not taking: Reported on 08/04/2014) 30 tablet 1  . oxyCODONE-acetaminophen (PERCOCET/ROXICET) 5-325 MG per tablet Take 1-2 tablets by mouth every 6 (six) hours as needed for severe pain. (Patient not taking: Reported on 08/04/2014) 60 tablet 0  . polyethylene glycol powder (GLYCOLAX/MIRALAX) powder 1 cap full in a full glass of water, once a day for 5 days. (Patient not taking: Reported on 08/04/2014) 255 g 0  . potassium chloride SA (K-DUR,KLOR-CON) 10 MEQ tablet Take 1 tablet (10 mEq total) by mouth 2 (two) times daily. for 3 days then 1 pill a day (Patient taking differently: Take 10 mEq by mouth 2 (two) times daily. ) 20 tablet 0  . ranitidine (ZANTAC) 150 MG tablet Take 150 mg by mouth 2 (two) times daily.    . simvastatin (ZOCOR) 40 MG tablet Take 40 mg by mouth at bedtime.     Marland Kitchen zolpidem (AMBIEN) 5 MG tablet Take 5 mg by mouth at bedtime as needed for sleep.    Marland Kitchen albuterol (PROVENTIL HFA;VENTOLIN HFA) 108 (90 BASE) MCG/ACT inhaler Inhale 1 puff into the lungs every 6 (six) hours as needed for wheezing or shortness of breath.    Marland Kitchen azithromycin (ZITHROMAX Z-PAK) 250 MG tablet Take 2 tablets (500 mg) on  Day 1,  followed by 1 tablet (250 mg) once daily on Days 2 through 5. 6 each 0  . clindamycin (CLEOCIN) 300 MG capsule Take 300 mg by mouth 3 (three) times daily.    . clonazePAM (KLONOPIN) 1 MG tablet Take 1 mg by mouth 3 (three) times daily as needed for anxiety.    . lidocaine (XYLOCAINE) 2 % jelly Apply 1 application topically as  needed (for pain).    Marland Kitchen oxybutynin (DITROPAN) 5 MG tablet Take 5 mg by mouth 2 (two) times daily.    Marland Kitchen oxyCODONE-acetaminophen (PERCOCET) 10-325 MG per tablet Take 1 tablet by mouth every 6 (six) hours as needed for pain. 40 tablet 0  . predniSONE (DELTASONE) 20 MG tablet Take 2 tablets (40 mg total) by mouth daily. 10 tablet 0  . psyllium (METAMUCIL) 58.6 % powder Take 1 packet by mouth 2 (two) times daily as needed (for constipation).    Marland Kitchen senna (SENOKOT) 8.6 MG tablet Take 2 tablets by mouth  2 (two) times daily as needed for constipation.    Marland Kitchen tiotropium (SPIRIVA) 18 MCG inhalation capsule Place 18 mcg into inhaler and inhale daily.     No current facility-administered medications for this visit.   Facility-Administered Medications Ordered in Other Visits  Medication Dose Route Frequency Provider Last Rate Last Dose  . heparin lock flush 100 unit/mL  500 Units Intravenous Once Lequita Asal, MD      . sodium chloride 0.9 % injection 10 mL  10 mL Intravenous PRN Lequita Asal, MD        Review of Systems:  GENERAL:  Feels "ok".  No fevers or sweats.  Weight up 3 pounds. PERFORMANCE STATUS (ECOG):  2 HEENT:  No visual changes, runny nose, mouth sores or sore throat. Lungs: No shortness of breath or cough.  No hemoptysis. Cardiac:  No chest pain, palpitations, orthopnea, or PND. GI:  Colostomy.  Less leakage from anus.  No nausea, vomiting, diarrhea, constipation, melena or hematochezia. GU:  No urgency, frequency, dysuria, or hematuria. Musculoskeletal:  No back pain.  No joint pain.  No muscle tenderness. Extremities:  No pain or swelling. Skin:  No rashes or skin changes. Neuro:  No headache, numbness or weakness, balance or coordination issues. Endocrine:  No diabetes, thyroid issues, hot flashes or night sweats. Psych:  No mood changes or depression.  Anxiety improved since start of treatment. Pain:  Pain associated with anal mass. Review of systems:  All other systems  reviewed and found to be negative.   Physical Exam: Blood pressure 155/97, pulse 114, temperature 97.8 F (36.6 C), temperature source Tympanic, resp. rate 22, height 5' 8.5" (1.74 m), weight 184 lb 10.2 oz (83.75 kg).  GENERAL:  Chronically ill appearing woman lying on her side in the exam room in no acute distress.  Room has a foul odor. MENTAL STATUS:  Alert and oriented to person, place and time. HEAD:  Pearline Cables hair.  Normocephalic, atraumatic, face symmetric, no Cushingoid features. EYES:  Blue eyes.  Pupils equal round and reactive to light and accomodation.  No conjunctivitis or scleral icterus. ENT: No oral lesions.  Tongue normal. Mucous membranes moist.  RESPIRATORY:  Clear to auscultation without rales, wheezes or rhonchi. CARDIOVASCULAR:  Regular rate and rhythm without murmur, rub or gallop. ABDOMEN:  Soft, non-tender, with active bowel sounds, and no hepatosplenomegaly.  No masses. RECTUM:  Ruddy erythema with moist perirectal desquamation. SKIN:  No rashes, ulcers or lesions. EXTREMITIES: No edema, no skin discoloration or tenderness.  No palpable cords. LYMPH NODES: No palpable cervical, supraclavicular, axillary or inguinal adenopathy  NEUROLOGICAL: Unremarkable. PSYCH:  Appropriate.   Appointment on 07/31/2014  Component Date Value Ref Range Status  . WBC 07/31/2014 5.4  3.6 - 11.0 K/uL Final  . RBC 07/31/2014 4.00  3.80 - 5.20 MIL/uL Final  . Hemoglobin 07/31/2014 12.6  12.0 - 16.0 g/dL Final  . HCT 07/31/2014 38.0  35.0 - 47.0 % Final  . MCV 07/31/2014 94.9  80.0 - 100.0 fL Final  . MCH 07/31/2014 31.4  26.0 - 34.0 pg Final  . MCHC 07/31/2014 33.1  32.0 - 36.0 g/dL Final  . RDW 07/31/2014 19.0* 11.5 - 14.5 % Final  . Platelets 07/31/2014 288  150 - 440 K/uL Final  . Neutrophils Relative % 07/31/2014 60   Final  . Neutro Abs 07/31/2014 3.3  1.4 - 6.5 K/uL Final  . Lymphocytes Relative 07/31/2014 22   Final  . Lymphs Abs 07/31/2014 1.2  1.0 -  3.6 K/uL Final  .  Monocytes Relative 07/31/2014 13   Final  . Monocytes Absolute 07/31/2014 0.7  0.2 - 0.9 K/uL Final  . Eosinophils Relative 07/31/2014 4   Final  . Eosinophils Absolute 07/31/2014 0.2  0 - 0.7 K/uL Final  . Basophils Relative 07/31/2014 1   Final  . Basophils Absolute 07/31/2014 0.0  0 - 0.1 K/uL Final  . Sodium 07/31/2014 134* 135 - 145 mmol/L Final  . Potassium 07/31/2014 3.8  3.5 - 5.1 mmol/L Final  . Chloride 07/31/2014 106  101 - 111 mmol/L Final  . CO2 07/31/2014 23  22 - 32 mmol/L Final  . Glucose, Bld 07/31/2014 110* 65 - 99 mg/dL Final  . BUN 07/31/2014 6  6 - 20 mg/dL Final  . Creatinine, Ser 07/31/2014 0.76  0.44 - 1.00 mg/dL Final  . Calcium 07/31/2014 8.1* 8.9 - 10.3 mg/dL Final  . Total Protein 07/31/2014 6.7  6.5 - 8.1 g/dL Final  . Albumin 07/31/2014 2.8* 3.5 - 5.0 g/dL Final  . AST 07/31/2014 28  15 - 41 U/L Final  . ALT 07/31/2014 10* 14 - 54 U/L Final  . Alkaline Phosphatase 07/31/2014 60  38 - 126 U/L Final  . Total Bilirubin 07/31/2014 0.6  0.3 - 1.2 mg/dL Final  . GFR calc non Af Amer 07/31/2014 >60  >60 mL/min Final  . GFR calc Af Amer 07/31/2014 >60  >60 mL/min Final   Comment: (NOTE) The eGFR has been calculated using the CKD EPI equation. This calculation has not been validated in all clinical situations. eGFR's persistently <60 mL/min signify possible Chronic Kidney Disease.   . Anion gap 07/31/2014 5  5 - 15 Final    Assessment:  [name] is a 59 y.o. 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  Chest, abdomen, and pelvic CT scan on 05/19/2014 revealed no evidence of metastatic disease.  There was a 4 mm RUL pulmonary nodule.  There were borderline (13 mm) enlarged iliac nodes.  There was a 4.7 x 4.1 x 6.0 cm lower rectal and anal mass consistent with anal cancer.  She underwent diverting  colostomy on 05/26/2014.  She has anal leakage.    She is currently day 33 of cycle #1 concurrent chemotherapy (5FU and mitomycin-C) and radiation (began 06/29/2014).  She missed 3 days of radiation (07/04-07/06/2014).  Anal leakage has decreased. She has perirectal breakdown.  Plan: 1.  Labs today:  CBC with diff, CMP, Mg. 2.  Cycle #2 5FU and mitomycin-C 3.  RTC on 07/29 for pump disconnect. 4.  Refill Percocet 10/325 1 q 6 hours prn pain; dis #40. 5.  Continue oral potassium. 6.  Wound care consult. 7.  RTC in 1 week for MD assess and labs (BMP and Mg). 8.  RTC on 08/09 for MD assess and labs (CBC with diff, BMP).   Lequita Asal, MD  07/31/2014, 1:30 PM

## 2014-08-01 ENCOUNTER — Ambulatory Visit: Payer: Commercial Managed Care - HMO | Admitting: *Deleted

## 2014-08-01 ENCOUNTER — Ambulatory Visit
Admission: RE | Admit: 2014-08-01 | Discharge: 2014-08-01 | Disposition: A | Discharge status: Home / Self Care | Payer: Commercial Managed Care - HMO | Source: Ambulatory Visit | Attending: Radiation Oncology | Admitting: Radiation Oncology

## 2014-08-01 ENCOUNTER — Inpatient Hospital Stay: Payer: Commercial Managed Care - HMO

## 2014-08-01 ENCOUNTER — Ambulatory Visit: Payer: Commercial Managed Care - HMO | Admitting: Psychiatry

## 2014-08-01 ENCOUNTER — Other Ambulatory Visit: Payer: Self-pay | Admitting: *Deleted

## 2014-08-01 DIAGNOSIS — Z510 Encounter for antineoplastic radiation therapy: Secondary | ICD-10-CM | POA: Diagnosis not present

## 2014-08-01 NOTE — Patient Outreach (Signed)
[facility]) Care Management  [facility] Social Work  08/01/2014  [name] [birth year] [id number]  Current Medications:  Current Outpatient Prescriptions  Medication Sig Dispense Refill  . Aclidinium Bromide 400 MCG/ACT AEPB Inhale into the lungs. Pt to take twice a day.    . albuterol (PROVENTIL) (2.5 MG/3ML) 0.083% nebulizer solution Inhale 3 mLs (2.5 mg total) into the lungs every 6 (six) hours as needed for wheezing or shortness of breath. 75 mL 12  . Alum & Mag Hydroxide-Simeth (MAGIC MOUTHWASH) SOLN Take 5 mLs by mouth 4 (four) times daily. 480 mL 3  . budesonide-formoterol (SYMBICORT) 160-4.5 MCG/ACT inhaler Inhale 2 puffs into the lungs 2 (two) times daily. 1 Inhaler 12  . Calcium Carb-Cholecalciferol (CALCIUM 600+D) 600-800 MG-UNIT TABS Take by mouth 2 (two) times daily.    . ciprofloxacin (CIPRO) 500 MG tablet Take 500 mg by mouth 2 (two) times daily.    . clindamycin (CLEOCIN) 300 MG capsule Take 1 capsule (300 mg total) by mouth 3 (three) times daily. 30 capsule 0  . clonazePAM (KLONOPIN) 1 MG tablet Take 1 tablet (1 mg total) by mouth 3 (three) times daily as needed (anxiety). 30 tablet 0  . docusate sodium (COLACE) 100 MG capsule Take 2 capsules (200 mg total) by mouth 2 (two) times daily. 120 capsule 0  . DULoxetine (CYMBALTA) 60 MG capsule Take 60 mg by mouth every evening.    . escitalopram (LEXAPRO) 10 MG tablet     . escitalopram (LEXAPRO) 20 MG tablet Take 20 mg by mouth at bedtime.    Marland Kitchen etodolac (LODINE) 200 MG capsule     . etodolac (LODINE) 500 MG tablet Take 500 mg by mouth 2 (two) times daily.    Marland Kitchen lidocaine (XYLOCAINE) 2 % jelly APPLY EXTERNALLY 4 TIMES A DAY AS NEEDED  2  . ondansetron (ZOFRAN) 8 MG tablet Take 1 tablet (8 mg total) by mouth 2 (two) times daily. Start the day after chemo for 2 days. Then take as needed for nausea or vomiting. 30 tablet 1  . oxybutynin (DITROPAN) 5 MG tablet     . oxyCODONE-acetaminophen (PERCOCET) 10-325 MG  per tablet Take 1 tablet by mouth every 6 (six) hours as needed for pain. 40 tablet 0  . oxyCODONE-acetaminophen (PERCOCET/ROXICET) 5-325 MG per tablet Take 1-2 tablets by mouth every 6 (six) hours as needed for severe pain. 60 tablet 0  . polyethylene glycol powder (GLYCOLAX/MIRALAX) powder 1 cap full in a full glass of water, once a day for 5 days. 255 g 0  . potassium chloride SA (K-DUR,KLOR-CON) 10 MEQ tablet Take 1 tablet (10 mEq total) by mouth 2 (two) times daily. for 3 days then 1 pill a day 20 tablet 0  . ranitidine (ZANTAC) 150 MG tablet Take 150 mg by mouth 2 (two) times daily.    Marland Kitchen senna (SENOKOT) 8.6 MG TABS tablet Take 2 tablets (17.2 mg total) by mouth 2 (two) times daily. 120 each 0  . simvastatin (ZOCOR) 40 MG tablet Take 40 mg by mouth every evening.     . tiotropium (SPIRIVA) 18 MCG inhalation capsule Place 18 mcg into inhaler and inhale every morning.     . zolpidem (AMBIEN) 5 MG tablet Take 5 mg by mouth at bedtime as needed for sleep.     No current facility-administered medications for this visit.   Facility-Administered Medications Ordered in Other Visits  Medication Dose Route Frequency Provider Last Rate Last Dose  . heparin lock  flush 100 unit/mL  500 Units Intravenous Once Lequita Asal, MD      . sodium chloride 0.9 % injection 10 mL  10 mL Intravenous PRN Lequita Asal, MD        Functional Status:  In your present state of health, do you have any difficulty performing the following activities: 06/28/2014  Hearing? N  Vision? N  Difficulty concentrating or making decisions? N  Walking or climbing stairs? Y  Dressing or bathing? Y  Doing errands, shopping? Y  Do you have problems with loss of bowel control? Y    Fall/Depression Screening:  PHQ 2/9 Scores 06/12/2014  PHQ - 2 Score 2    Assessment:  Home visit to patient's boarding home to provide gripper for shower to avoid falls.  Patient laying on a cot with foam mattress on top. Home observed to  be disorganized and unkempt.  Patient's granddaughter and granddaughter's husband in the room during visit.   Patient, was in good spirits.  Reports that her  last chemotherapy session will end  this week and her last radiation treatment would be some time around 8/15.  Per patient, her tumor is shrinking, will be  going to see a wound care specialist for follow up.  Per patient, her mouth sores are starting to heal up.   Patient's  grandaughter staying with patient at this time to help her clean. Patient still waiting on a hospital bed.  She has not talked to the social worker  Barnabas Lister to assist with this. Message left for him during the visit.  Patient has no tranportation needs.  The cancer center provides rides for her cancer treatments and she uses [facility] for all other medical transportation needs.    Sensitive wipes and bath tub gripper provided for the shower to avoid falls.  Per patient  She has had 3 falls in the last 6 month.    $25.00 gift card provided from the giving committee to assist with additional food items.  Per patient, a lot of her money goes to supplies that insurance does not cover.  Appointment with psychiatrist scheduled for  today at 1:00pm for medication management.  Patient very appreciative for the items provided " I can finally take a real shower"   Plan:  This Education officer, museum will follow up with hospital bed request.      [name] [facility] Care Management [phone]

## 2014-08-02 ENCOUNTER — Inpatient Hospital Stay: Payer: Commercial Managed Care - HMO

## 2014-08-02 ENCOUNTER — Ambulatory Visit
Admission: RE | Admit: 2014-08-02 | Discharge: 2014-08-02 | Disposition: A | Discharge status: Home / Self Care | Payer: Commercial Managed Care - HMO | Source: Ambulatory Visit | Attending: Radiation Oncology | Admitting: Radiation Oncology

## 2014-08-02 DIAGNOSIS — Z510 Encounter for antineoplastic radiation therapy: Secondary | ICD-10-CM | POA: Diagnosis not present

## 2014-08-03 ENCOUNTER — Other Ambulatory Visit: Payer: Self-pay | Admitting: *Deleted

## 2014-08-03 ENCOUNTER — Inpatient Hospital Stay: Payer: Commercial Managed Care - HMO

## 2014-08-03 ENCOUNTER — Ambulatory Visit: Payer: Commercial Managed Care - HMO

## 2014-08-03 ENCOUNTER — Ambulatory Visit
Admission: RE | Admit: 2014-08-03 | Discharge: 2014-08-03 | Disposition: A | Discharge status: Home / Self Care | Payer: Commercial Managed Care - HMO | Source: Ambulatory Visit | Attending: Radiation Oncology | Admitting: Radiation Oncology

## 2014-08-03 DIAGNOSIS — Z510 Encounter for antineoplastic radiation therapy: Secondary | ICD-10-CM | POA: Diagnosis not present

## 2014-08-03 NOTE — Patient Outreach (Addendum)
[facility]) Care Management  08/03/2014  [name] 1955/11/30 [id number]  Care coordination phone call to [place] worker for the [facility] [phone]  to obtain assistance with patient getting a hospital bed possibly.  Voicemail message left for a return call.     [name] [facility] Care Management [phone]

## 2014-08-04 ENCOUNTER — Emergency Department: Payer: Commercial Managed Care - HMO

## 2014-08-04 ENCOUNTER — Inpatient Hospital Stay: Payer: Commercial Managed Care - HMO

## 2014-08-04 ENCOUNTER — Telehealth: Payer: Self-pay

## 2014-08-04 ENCOUNTER — Ambulatory Visit
Admission: RE | Admit: 2014-08-04 | Discharge: 2014-08-04 | Disposition: A | Discharge status: Home / Self Care | Payer: Commercial Managed Care - HMO | Source: Ambulatory Visit | Attending: Radiation Oncology | Admitting: Radiation Oncology

## 2014-08-04 ENCOUNTER — Other Ambulatory Visit: Payer: Self-pay

## 2014-08-04 ENCOUNTER — Other Ambulatory Visit: Payer: Self-pay | Admitting: *Deleted

## 2014-08-04 ENCOUNTER — Emergency Department
Admission: EM | Admit: 2014-08-04 | Discharge: 2014-08-04 | Disposition: A | Discharge status: Home / Self Care | Payer: Commercial Managed Care - HMO | Attending: Emergency Medicine | Admitting: Emergency Medicine

## 2014-08-04 DIAGNOSIS — I10 Essential (primary) hypertension: Secondary | ICD-10-CM | POA: Insufficient documentation

## 2014-08-04 DIAGNOSIS — C210 Malignant neoplasm of anus, unspecified: Secondary | ICD-10-CM | POA: Diagnosis not present

## 2014-08-04 DIAGNOSIS — R0602 Shortness of breath: Secondary | ICD-10-CM | POA: Diagnosis present

## 2014-08-04 DIAGNOSIS — Z85828 Personal history of other malignant neoplasm of skin: Secondary | ICD-10-CM | POA: Insufficient documentation

## 2014-08-04 DIAGNOSIS — Z791 Long term (current) use of non-steroidal anti-inflammatories (NSAID): Secondary | ICD-10-CM | POA: Insufficient documentation

## 2014-08-04 DIAGNOSIS — Z85048 Personal history of other malignant neoplasm of rectum, rectosigmoid junction, and anus: Secondary | ICD-10-CM | POA: Diagnosis not present

## 2014-08-04 DIAGNOSIS — Z720 Tobacco use: Secondary | ICD-10-CM | POA: Diagnosis not present

## 2014-08-04 DIAGNOSIS — Z510 Encounter for antineoplastic radiation therapy: Secondary | ICD-10-CM | POA: Diagnosis not present

## 2014-08-04 DIAGNOSIS — C801 Malignant (primary) neoplasm, unspecified: Secondary | ICD-10-CM

## 2014-08-04 DIAGNOSIS — Z79899 Other long term (current) drug therapy: Secondary | ICD-10-CM | POA: Insufficient documentation

## 2014-08-04 DIAGNOSIS — J441 Chronic obstructive pulmonary disease with (acute) exacerbation: Secondary | ICD-10-CM | POA: Insufficient documentation

## 2014-08-04 DIAGNOSIS — Z7951 Long term (current) use of inhaled steroids: Secondary | ICD-10-CM | POA: Insufficient documentation

## 2014-08-04 LAB — CBC
HCT: 36.4 % (ref 35.0–47.0)
HEMOGLOBIN: 12.2 g/dL (ref 12.0–16.0)
MCH: 31.5 pg (ref 26.0–34.0)
MCHC: 33.5 g/dL (ref 32.0–36.0)
MCV: 94.2 fL (ref 80.0–100.0)
Platelets: 173 10*3/uL (ref 150–440)
RBC: 3.87 MIL/uL (ref 3.80–5.20)
RDW: 18.7 % — ABNORMAL HIGH (ref 11.5–14.5)
WBC: 5.4 10*3/uL (ref 3.6–11.0)

## 2014-08-04 LAB — BASIC METABOLIC PANEL
Anion gap: 8 (ref 5–15)
BUN: 8 mg/dL (ref 6–20)
CHLORIDE: 103 mmol/L (ref 101–111)
CO2: 24 mmol/L (ref 22–32)
CREATININE: 0.66 mg/dL (ref 0.44–1.00)
Calcium: 8.2 mg/dL — ABNORMAL LOW (ref 8.9–10.3)
GFR calc non Af Amer: >60 mL/min (ref >60)
Glucose, Bld: 110 mg/dL — ABNORMAL HIGH (ref 65–99)
Potassium: 2.9 mmol/L — CL (ref 3.5–5.1)
Sodium: 135 mmol/L (ref 135–145)

## 2014-08-04 MED ORDER — METHYLPREDNISOLONE SODIUM SUCC 125 MG IJ SOLR
125.0000 mg | Freq: Once | INTRAMUSCULAR | Status: AC
  Administered 2014-08-04: 125 mg via INTRAVENOUS
  Filled 2014-08-04: qty 2

## 2014-08-04 MED ORDER — IPRATROPIUM-ALBUTEROL 0.5-2.5 (3) MG/3ML IN SOLN
3.0000 mL | Freq: Once | RESPIRATORY_TRACT | Status: AC
  Administered 2014-08-04: 3 mL via RESPIRATORY_TRACT
  Filled 2014-08-04: qty 3

## 2014-08-04 MED ORDER — AZITHROMYCIN 250 MG PO TABS
500.0000 mg | ORAL_TABLET | Freq: Once | ORAL | Status: AC
  Administered 2014-08-04: 500 mg via ORAL
  Filled 2014-08-04: qty 2

## 2014-08-04 MED ORDER — PREDNISONE 20 MG PO TABS: 40.0000 mg | ORAL_TABLET | Freq: Every day | ORAL | Status: DC

## 2014-08-04 MED ORDER — AZITHROMYCIN 250 MG PO TABS: ORAL_TABLET | ORAL | Status: AC

## 2014-08-04 MED ORDER — SODIUM CHLORIDE 0.9 % IJ SOLN
10.0000 mL | Freq: Once | INTRAMUSCULAR | Status: AC
  Administered 2014-08-04: 10 mL via INTRAVENOUS
  Filled 2014-08-04: qty 10

## 2014-08-04 MED ORDER — HEPARIN SOD (PORK) LOCK FLUSH 100 UNIT/ML IV SOLN
INTRAVENOUS | Status: AC
  Filled 2014-08-04: qty 5

## 2014-08-04 NOTE — ED Provider Notes (Signed)
[facility] Emergency Department Provider Note  Time seen: 3:34 PM  I have reviewed the triage vital signs and the nursing notes.   HISTORY  Chief Complaint Shortness of Breath    HPI [name] is a 59 y.o. female with a past medical history of schizophrenia, asthma, COPD, bipolar, PTSD, fibromyalgia, DVTs, pneumonia, hypertension, presents the emergency department for difficulty breathing. Patient states she has been having increased shortness of breath for the past 5-7 days. Also states mild cough at times. Denies any chest pain. The patient was at the cancer center getting her last round of chemotherapy for anal cancer, as well as radiation. She discussed her progressive shortness of breath with her oncologist who recommended she come to the emergency department for evaluation. Patient does have a significant COPD history, continues to smoke approximately half a pack of cigarettes per day. States she has not had air conditioning in her boarding house for the past week which is likely exacerbating her COPD symptoms. Denies any chest pain, denies any pleuritic chest pain, denies any lower extremity swelling or pain.     Past Medical History  Diagnosis Date  . Schizophrenia   . Asthma   . GERD (gastroesophageal reflux disease)   . Anxiety   . Depression   . Bipolar disorder   . COPD (chronic obstructive pulmonary disease)   . Occasional tremors     right hand  . PTSD (post-traumatic stress disorder)   . Shortness of breath dyspnea   . Fibromyalgia   . DVT (deep venous thrombosis) 2011    RUE  . Thyroid nodule   . DDD (degenerative disc disease), lumbar   . Spinal stenosis   . Peripheral neuropathy   . Rotator cuff tear     right  . Pneumonia 2011  . Hypothyroidism     no meds currently  . Anemia     during pregnancy only  . Hypertension     Off meds x 15 years-well controlled now per pt  . Squamous cell cancer, anus   . Severe  obstructive sleep apnea 06/27/2014    Patient Active Problem List   Diagnosis Date Noted  . Hypokalemia 07/20/2014  . Anal cancer 06/27/2014  . Anal fissure 06/27/2014  . Anxiety 06/27/2014  . Airway hyperreactivity 06/27/2014  . H/O manic depressive disorder 06/27/2014  . CAFL (chronic airflow limitation) 06/27/2014  . Clinical depression 06/27/2014  . Deep vein thrombosis 06/27/2014  . External hemorrhoid 06/27/2014  . Myalgia and myositis 06/27/2014  . Acid reflux 06/27/2014  . Hemorrhoid 06/27/2014  . BP (high blood pressure) 06/27/2014  . HLD (hyperlipidemia) 06/27/2014  . Adult hypothyroidism 06/27/2014  . LBP (low back pain) 06/27/2014  . Mass of perianal area 06/27/2014  . External hemorrhoid, thrombosed 06/27/2014  . 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 Ulcerated hemorrhoid 06/27/2014  . Severe obstructive sleep apnea 06/27/2014  . Squamous cell cancer, anus   . Rectal ulcer 05/17/2014    Past Surgical History  Procedure Laterality Date  . Foot surgery Right   . Tubal ligation    . Eye surgery Bilateral   . Mouth surgery  2002  . Rectal biopsy N/A 05/08/2014    Procedure: BIOPSY RECTAL;  Surgeon: Marlyce Huge, MD;  Location: [facility] ORS;  Service: General;  Laterality: N/A;  . Evaluation under anesthesia with hemorrhoidectomy N/A 05/08/2014    Procedure: EXAM UNDER ANESTHESIA WITH HEMORRHOIDECTOMY;  Surgeon: Marlyce Huge, MD;  Location: [facility] ORS;  Service: General;  Laterality: N/A;  . Portacath placement N/A 05/20/2014    Procedure: INSERTION PORT-A-CATH;  Surgeon: Florene Glen, MD;  Location: [facility] ORS;  Service: General;  Laterality: N/A;  . Laparoscopic diverted colostomy N/A 05/26/2014    Procedure: LAPAROSCOPIC DIVERTED COLOSTOMY;  Surgeon: Marlyce Huge, MD;  Location: [facility] ORS;  Service: General;  Laterality: N/A;    Current Outpatient Rx  Name  Route  Sig  Dispense  Refill  . Aclidinium Bromide 400 MCG/ACT AEPB   Inhalation   Inhale  into the lungs. Pt to take twice a day.         . albuterol (PROVENTIL) (2.5 MG/3ML) 0.083% nebulizer solution   Inhalation   Inhale 3 mLs (2.5 mg total) into the lungs every 6 (six) hours as needed for wheezing or shortness of breath.   75 mL   12   . Alum & Mag Hydroxide-Simeth (MAGIC MOUTHWASH) SOLN   Oral   Take 5 mLs by mouth 4 (four) times daily.   480 mL   3     Swish and spit   . budesonide-formoterol (SYMBICORT) 160-4.5 MCG/ACT inhaler   Inhalation   Inhale 2 puffs into the lungs 2 (two) times daily.   1 Inhaler   12   . Calcium Carb-Cholecalciferol (CALCIUM 600+D) 600-800 MG-UNIT TABS   Oral   Take by mouth 2 (two) times daily.         . ciprofloxacin (CIPRO) 500 MG tablet   Oral   Take 500 mg by mouth 2 (two) times daily.         . clindamycin (CLEOCIN) 300 MG capsule   Oral   Take 1 capsule (300 mg total) by mouth 3 (three) times daily.   30 capsule   0   . clonazePAM (KLONOPIN) 1 MG tablet   Oral   Take 1 tablet (1 mg total) by mouth 3 (three) times daily as needed (anxiety).   30 tablet   0   . docusate sodium (COLACE) 100 MG capsule   Oral   Take 2 capsules (200 mg total) by mouth 2 (two) times daily.   120 capsule   0   . DULoxetine (CYMBALTA) 60 MG capsule   Oral   Take 60 mg by mouth every evening.         . escitalopram (LEXAPRO) 10 MG tablet               . escitalopram (LEXAPRO) 20 MG tablet   Oral   Take 20 mg by mouth at bedtime.         Marland Kitchen etodolac (LODINE) 200 MG capsule               . etodolac (LODINE) 500 MG tablet   Oral   Take 500 mg by mouth 2 (two) times daily.         Marland Kitchen lidocaine (XYLOCAINE) 2 % jelly      APPLY EXTERNALLY 4 TIMES A DAY AS NEEDED      2   . ondansetron (ZOFRAN) 8 MG tablet   Oral   Take 1 tablet (8 mg total) by mouth 2 (two) times daily. Start the day after chemo for 2 days. Then take as needed for nausea or vomiting.   30 tablet   1   . oxybutynin (DITROPAN) 5 MG tablet                . oxyCODONE-acetaminophen (PERCOCET) 10-325 MG per tablet   Oral  Take 1 tablet by mouth every 6 (six) hours as needed for pain.   40 tablet   0   . oxyCODONE-acetaminophen (PERCOCET/ROXICET) 5-325 MG per tablet   Oral   Take 1-2 tablets by mouth every 6 (six) hours as needed for severe pain.   60 tablet   0   . polyethylene glycol powder (GLYCOLAX/MIRALAX) powder      1 cap full in a full glass of water, once a day for 5 days.   255 g   0   . potassium chloride SA (K-DUR,KLOR-CON) 10 MEQ tablet   Oral   Take 1 tablet (10 mEq total) by mouth 2 (two) times daily. for 3 days then 1 pill a day   20 tablet   0   . ranitidine (ZANTAC) 150 MG tablet   Oral   Take 150 mg by mouth 2 (two) times daily.         Marland Kitchen senna (SENOKOT) 8.6 MG TABS tablet   Oral   Take 2 tablets (17.2 mg total) by mouth 2 (two) times daily.   120 each   0   . simvastatin (ZOCOR) 40 MG tablet   Oral   Take 40 mg by mouth every evening.          . tiotropium (SPIRIVA) 18 MCG inhalation capsule   Inhalation   Place 18 mcg into inhaler and inhale every morning.          . zolpidem (AMBIEN) 5 MG tablet   Oral   Take 5 mg by mouth at bedtime as needed for sleep.           Allergies Sulfa antibiotics  Family History  Problem Relation Age of Onset  . Cancer Maternal Aunt   . Cancer Paternal Uncle     Social History History  Substance Use Topics  . Smoking status: Current Every Day Smoker -- 0.25 packs/day for 44 years    Types: Cigarettes  . Smokeless tobacco: Never Used  . Alcohol Use: Yes     Comment: 1 drink every 2-3 times/year    Review of Systems Constitutional: Negative for fever. Cardiovascular: Negative for chest pain. Respiratory: Positive for shortness of breath. Gastrointestinal: Negative for abdominal pain Musculoskeletal: Negative for back pain.  10-point ROS otherwise negative.  ____________________________________________   PHYSICAL  EXAM:  VITAL SIGNS: ED Triage Vitals  Enc Vitals Group     BP 08/04/14 1426 117/67 mmHg     Pulse Rate 08/04/14 1426 110     Resp 08/04/14 1426 22     Temp 08/04/14 1426 98.3 F (36.8 C)     Temp Source 08/04/14 1426 Oral     SpO2 08/04/14 1426 96 %     Weight 08/04/14 1426 170 lb (77.111 kg)     Height 08/04/14 1426 '5\' 8"'$  (1.727 m)     Head Cir --      Peak Flow --      Pain Score 08/04/14 1427 6     Pain Loc --      Pain Edu? --      Excl. in [place]? --     Constitutional: Alert and oriented. Well appearing and in no distress. Eyes: Normal exam ENT   Mouth/Throat: Mucous membranes are moist. Cardiovascular: Normal rhythm, mildly tachycardic at 110 bpm. No murmur. Respiratory: Normal respiratory effort without tachypnea nor retractions. Patient does have moderate expiratory wheeze in all lung fields. Occasional cough. No rhonchi. Patient does have a left chest  port which is currently accessed. Gastrointestinal: Soft and nontender. No distention.   Musculoskeletal: Nontender with normal range of motion in all extremities. No lower extremity tenderness or edema. Neurologic:  Normal speech and language. No gross focal neurologic deficits  Skin:  Skin is warm, dry and intact.  Psychiatric: Mood and affect are normal. Speech and behavior are normal.  ____________________________________________    EKG  EKG reviewed and interpreted by myself shows normal sinus rhythm at 93 bpm, narrow QRS, normal axis, normal intervals, no ST changes noted.  ____________________________________________    RADIOLOGY  Chest x-ray shows no acute cardiopulmonary abnormality.  ____________________________________________    INITIAL IMPRESSION / ASSESSMENT AND PLAN / ED COURSE  Pertinent labs & imaging results that were available during my care of the patient were reviewed by me and considered in my medical decision making (see chart for details).  Patient with symptoms most suggestive of  COPD exacerbation. Moderate wheeze on exam. We will treat with steroids, DuoNeb's, and place the patient on a Z-Pak. Patient currently in no distress, good oxygen saturation on room air. No signs of pneumonia on chest x-ray.  Patient states she is feeling much better after her breathing treatments. Patient currently satting 99-100% on room air. We'll discharge the patient home with steroids and a Z-Pak, patient follow up with her primary care doctor on Monday.  ____________________________________________   FINAL CLINICAL IMPRESSION(S) / ED DIAGNOSES  COPD exacerbation Dyspnea   Harvest Dark, MD 08/04/14 1625

## 2014-08-04 NOTE — Progress Notes (Unsigned)
[place] to inquire about criteria for a hosp. bed for the patient.  Levada Dy from [facility] care stated that process was lengthy and patient may not qualify.  As a result, contacted [facility] to deliver hospital bed to patient's address.  It will be covered under the [facility] for at least three months.  Will re-eval in 3 months to see if bed is still needed.

## 2014-08-04 NOTE — Patient Outreach (Signed)
[facility]) Care Management  08/04/2014  [name] 07-08-1955 [id number]  Return phone call from Elease Etienne, social worker at the [facility] to request assistance in getting patient a hospital bed.  Per Barnabas Lister, he had spoken to [facility] regarding the criteria for the hospital bed.  Due to the fact that the process is lengthy and the fact that patient may not qualify, he arranged for [facility] to deliver the hospital bed to patient's home to  be covered under the [facility].  This social worker extremely appreciative of his assistance with this matter.  CSW to call patient to follow up.   [name] [facility] Care Management [phone]

## 2014-08-04 NOTE — Telephone Encounter (Signed)
[name] called from radiation pt was being seen in radiation and had complaints of SOB and shaky.  Pt's HR was also elevated at 115.  Spoke with Dr. Mike Gip and per MD pt was advised to be seen in the ER.

## 2014-08-04 NOTE — Discharge Instructions (Signed)
As we have discussed your exam today is most consistent with a COPD exacerbation. Please take your prescribed steroids, and antibiotics as prescribed.  See her primary care doctor on Monday for reevaluation. Return to the emergency department for any chest pain, worsening trouble breathing, or any other symptom [place] concerning to self.    Chronic Obstructive Pulmonary Disease Exacerbation Chronic obstructive pulmonary disease (COPD) is a common lung condition in which airflow from the lungs is limited. COPD is a general term that can be used to describe many different lung problems that limit airflow, including chronic bronchitis and emphysema. COPD exacerbations are episodes when breathing symptoms become much worse and require extra treatment. Without treatment, COPD exacerbations can be life threatening, and frequent COPD exacerbations can cause further damage to your lungs. CAUSES   Respiratory infections.   Exposure to smoke.   Exposure to air pollution, chemical fumes, or dust. Sometimes there is no apparent cause or trigger. RISK FACTORS  Smoking cigarettes.  Older age.  Frequent prior COPD exacerbations. SIGNS AND SYMPTOMS   Increased coughing.   Increased thick spit (sputum) production.   Increased wheezing.   Increased shortness of breath.   Rapid breathing.   Chest tightness. DIAGNOSIS  Your medical history, a physical exam, and tests will help your health care provider make a diagnosis. Tests may include:  A chest X-ray.  Basic lab tests.  Sputum testing.  An arterial blood gas test. TREATMENT  Depending on the severity of your COPD exacerbation, you may need to be admitted to a hospital for treatment. Some of the treatments commonly used to treat COPD exacerbations are:   Antibiotic medicines.   Bronchodilators. These are drugs that expand the air passages. They may be given with an inhaler or nebulizer. Spacer devices may be needed to help  improve drug delivery.  Corticosteroid medicines.  Supplemental oxygen therapy.  HOME CARE INSTRUCTIONS   Do not smoke. Quitting smoking is very important to prevent COPD from getting worse and exacerbations from happening as often.  Avoid exposure to all substances that irritate the airway, especially to tobacco smoke.   If you were prescribed an antibiotic medicine, finish it all even if you start to feel better.  Take all medicines as directed by your health care provider.It is important to use correct technique with inhaled medicines.  Drink enough fluids to keep your urine clear or pale yellow (unless you have a medical condition that requires fluid restriction).  Use a cool mist vaporizer. This makes it easier to clear your chest when you cough.   If you have a home nebulizer and oxygen, continue to use them as directed.   Maintain all necessary vaccinations to prevent infections.   Exercise regularly.   Eat a healthy diet.   Keep all follow-up appointments as directed by your health care provider. SEEK IMMEDIATE MEDICAL CARE IF:  You have worsening shortness of breath.   You have trouble talking.   You have severe chest pain.  You have blood in your sputum.  You have a fever.  You have weakness, vomit repeatedly, or faint.   You feel confused.   You continue to get worse. MAKE SURE YOU:   Understand these instructions.  Will watch your condition.  Will get help right away if you are not doing well or get worse. Document Released: 10/20/2006 Document Revised: 05/09/2013 Document Reviewed: 08/27/2012 [facility] Patient Information 2015 [place], [place]. This information is not intended to replace advice given to you by your  health care provider. Make sure you discuss any questions you have with your health care provider. ° °

## 2014-08-04 NOTE — ED Notes (Signed)
Pt discharged home after verbalizing understanding of discharge instructions; nad noted.  Pt's port de-accessed and heparin flushed.

## 2014-08-04 NOTE — ED Notes (Signed)
Pt states that she just left the cancer center having her chemo pack removed and getting radiation, pt states that it has been getting harder for her to breathe, pt reports hx of copd, pt appears pale. Pt reports treatment for anal cancer

## 2014-08-05 ENCOUNTER — Encounter: Payer: Self-pay | Admitting: Hematology and Oncology

## 2014-08-05 ENCOUNTER — Other Ambulatory Visit: Payer: Self-pay | Admitting: Hematology and Oncology

## 2014-08-05 ENCOUNTER — Emergency Department
Admission: EM | Admit: 2014-08-05 | Discharge: 2014-08-07 | Disposition: A | Discharge status: Home / Self Care | Payer: Commercial Managed Care - HMO | Attending: Emergency Medicine | Admitting: Emergency Medicine

## 2014-08-05 DIAGNOSIS — C210 Malignant neoplasm of anus, unspecified: Secondary | ICD-10-CM | POA: Insufficient documentation

## 2014-08-05 DIAGNOSIS — Z7952 Long term (current) use of systemic steroids: Secondary | ICD-10-CM | POA: Insufficient documentation

## 2014-08-05 DIAGNOSIS — F32A Depression, unspecified: Secondary | ICD-10-CM

## 2014-08-05 DIAGNOSIS — Z720 Tobacco use: Secondary | ICD-10-CM | POA: Diagnosis not present

## 2014-08-05 DIAGNOSIS — Z79899 Other long term (current) drug therapy: Secondary | ICD-10-CM | POA: Diagnosis not present

## 2014-08-05 DIAGNOSIS — Z792 Long term (current) use of antibiotics: Secondary | ICD-10-CM | POA: Insufficient documentation

## 2014-08-05 DIAGNOSIS — R45851 Suicidal ideations: Secondary | ICD-10-CM

## 2014-08-05 DIAGNOSIS — F419 Anxiety disorder, unspecified: Secondary | ICD-10-CM | POA: Diagnosis not present

## 2014-08-05 DIAGNOSIS — F329 Major depressive disorder, single episode, unspecified: Secondary | ICD-10-CM | POA: Diagnosis not present

## 2014-08-05 DIAGNOSIS — Z7951 Long term (current) use of inhaled steroids: Secondary | ICD-10-CM | POA: Diagnosis not present

## 2014-08-05 DIAGNOSIS — F0390 Unspecified dementia without behavioral disturbance: Secondary | ICD-10-CM | POA: Diagnosis not present

## 2014-08-05 DIAGNOSIS — I10 Essential (primary) hypertension: Secondary | ICD-10-CM | POA: Insufficient documentation

## 2014-08-05 DIAGNOSIS — Z046 Encounter for general psychiatric examination, requested by authority: Secondary | ICD-10-CM | POA: Diagnosis present

## 2014-08-05 DIAGNOSIS — E876 Hypokalemia: Secondary | ICD-10-CM

## 2014-08-05 LAB — COMPREHENSIVE METABOLIC PANEL
ALK PHOS: 49 U/L (ref 38–126)
ALT: 11 U/L — AB (ref 14–54)
ANION GAP: 8 (ref 5–15)
AST: 25 U/L (ref 15–41)
Albumin: 2.9 g/dL — ABNORMAL LOW (ref 3.5–5.0)
BUN: 12 mg/dL (ref 6–20)
CHLORIDE: 107 mmol/L (ref 101–111)
CO2: 25 mmol/L (ref 22–32)
Calcium: 8.8 mg/dL — ABNORMAL LOW (ref 8.9–10.3)
Creatinine, Ser: 0.64 mg/dL (ref 0.44–1.00)
GFR calc Af Amer: >60 mL/min (ref >60)
GFR calc non Af Amer: >60 mL/min (ref >60)
Glucose, Bld: 119 mg/dL — ABNORMAL HIGH (ref 65–99)
POTASSIUM: 2.9 mmol/L — AB (ref 3.5–5.1)
SODIUM: 140 mmol/L (ref 135–145)
TOTAL PROTEIN: 6.9 g/dL (ref 6.5–8.1)
Total Bilirubin: 0.5 mg/dL (ref 0.3–1.2)

## 2014-08-05 LAB — URINE DRUG SCREEN, QUALITATIVE (ARMC ONLY)
Amphetamines, Ur Screen: NOT DETECTED
BARBITURATES, UR SCREEN: NOT DETECTED
BENZODIAZEPINE, UR SCRN: NOT DETECTED
CANNABINOID 50 NG, UR ~~LOC~~: NOT DETECTED
Cocaine Metabolite,Ur ~~LOC~~: NOT DETECTED
MDMA (Ecstasy)Ur Screen: NOT DETECTED
Methadone Scn, Ur: NOT DETECTED
Opiate, Ur Screen: NOT DETECTED
Phencyclidine (PCP) Ur S: NOT DETECTED
TRICYCLIC, UR SCREEN: NOT DETECTED

## 2014-08-05 LAB — CBC
HEMATOCRIT: 35.1 % (ref 35.0–47.0)
Hemoglobin: 11.8 g/dL — ABNORMAL LOW (ref 12.0–16.0)
MCH: 31.8 pg (ref 26.0–34.0)
MCHC: 33.7 g/dL (ref 32.0–36.0)
MCV: 94.4 fL (ref 80.0–100.0)
Platelets: 171 10*3/uL (ref 150–440)
RBC: 3.72 MIL/uL — ABNORMAL LOW (ref 3.80–5.20)
RDW: 18.9 % — ABNORMAL HIGH (ref 11.5–14.5)
WBC: 5.1 10*3/uL (ref 3.6–11.0)

## 2014-08-05 LAB — ACETAMINOPHEN LEVEL: Acetaminophen (Tylenol), Serum: <10 ug/mL — ABNORMAL LOW (ref 10–30)

## 2014-08-05 LAB — ETHANOL: Alcohol, Ethyl (B): <5 mg/dL (ref <5)

## 2014-08-05 LAB — SALICYLATE LEVEL

## 2014-08-05 MED ORDER — AZITHROMYCIN 250 MG PO TABS
ORAL_TABLET | ORAL | Status: AC
  Filled 2014-08-05: qty 1

## 2014-08-05 MED ORDER — OXYCODONE-ACETAMINOPHEN 5-325 MG PO TABS
2.0000 | ORAL_TABLET | Freq: Four times a day (QID) | ORAL | Status: DC | PRN
  Administered 2014-08-05 – 2014-08-07 (×4): 2 via ORAL
  Filled 2014-08-05 (×4): qty 2

## 2014-08-05 MED ORDER — AZITHROMYCIN 250 MG PO TABS
250.0000 mg | ORAL_TABLET | Freq: Once | ORAL | Status: AC
  Administered 2014-08-05: 250 mg via ORAL
  Filled 2014-08-05: qty 1

## 2014-08-05 MED ORDER — ALBUTEROL SULFATE (2.5 MG/3ML) 0.083% IN NEBU
2.5000 mg | INHALATION_SOLUTION | Freq: Once | RESPIRATORY_TRACT | Status: AC
  Administered 2014-08-05: 2.5 mg via RESPIRATORY_TRACT
  Filled 2014-08-05: qty 3

## 2014-08-05 MED ORDER — PREDNISONE 20 MG PO TABS
40.0000 mg | ORAL_TABLET | Freq: Once | ORAL | Status: AC
  Administered 2014-08-05: 40 mg via ORAL
  Filled 2014-08-05: qty 2

## 2014-08-05 NOTE — ED Notes (Signed)

## 2014-08-05 NOTE — ED Notes (Signed)
Lab called with K of 2.9.  MD aware.

## 2014-08-05 NOTE — ED Notes (Signed)
Pt here with [facility] voluntarily with c/o a lot of stressors with CA, living in a boarding house with no air conditioning with COPD with her daughter and grand daughter, states "I am just tired I want to die but I dont want to die, Im just tired" states she felt like if she didn't call 911 she would just overdose on her pills.the patient is tearful in triage

## 2014-08-05 NOTE — ED Provider Notes (Signed)
[facility] Emergency Department Provider Note  ____________________________________________  Time seen: 2005  I have reviewed the triage vital signs and the nursing notes.   HISTORY  Chief Complaint Psychiatric Evaluation     HPI [name] is a 59 y.o. female presents to the emergency department due to depression, stress, and thoughts of not wanting to live. The patient has a history of anal cancer and is currently under treatment. She just finished her chemotherapy. She continues with radiation treatment. She lives in a boarding house. Her daughter lives in the adjacent room with the patient's granddaughter. Today, the patient had increase in frustrations. She says she feels as though she is useless, as though no one cares. She has been generous when possible with her daughter, but she feels like no one cares about her. She is having thoughts of self-harm and doesn't wish to go on. She presents to the emergency department saying she would like to commit herself.  As noted above, the patient is undergoing ongoing treatment for her anal cancer. With this, she has some anal leakage. This was present before her treatments started because of the mass that holds her sphincter open, as explained by the patient. Currently, she also has some skin breakdown back in the perianal area.  Also of note, the patient has a history of COPD and was seen in the emergency department yesterday for a COPD exacerbation. She has not been able to fill her medications that were prescribed, including steroids and azithromycin.   Past Medical History  Diagnosis Date  . Schizophrenia   . Asthma   . GERD (gastroesophageal reflux disease)   . Anxiety   . Depression   . Bipolar disorder   . COPD (chronic obstructive pulmonary disease)   . Occasional tremors     right hand  . PTSD (post-traumatic stress disorder)   . Shortness of breath dyspnea   . Fibromyalgia   . DVT (deep  venous thrombosis) 2011    RUE  . Thyroid nodule   . DDD (degenerative disc disease), lumbar   . Spinal stenosis   . Peripheral neuropathy   . Rotator cuff tear     right  . Pneumonia 2011  . Hypothyroidism     no meds currently  . Anemia     during pregnancy only  . Hypertension     Off meds x 15 years-well controlled now per pt  . Squamous cell cancer, anus   . Severe obstructive sleep apnea 06/27/2014    Patient Active Problem List   Diagnosis Date Noted  . Hypokalemia 07/20/2014  . Anal cancer 06/27/2014  . Anal fissure 06/27/2014  . Anxiety 06/27/2014  . Airway hyperreactivity 06/27/2014  . H/O manic depressive disorder 06/27/2014  . CAFL (chronic airflow limitation) 06/27/2014  . Clinical depression 06/27/2014  . Deep vein thrombosis 06/27/2014  . External hemorrhoid 06/27/2014  . Myalgia and myositis 06/27/2014  . Acid reflux 06/27/2014  . Hemorrhoid 06/27/2014  . BP (high blood pressure) 06/27/2014  . HLD (hyperlipidemia) 06/27/2014  . Adult hypothyroidism 06/27/2014  . LBP (low back pain) 06/27/2014  . Mass of perianal area 06/27/2014  . External hemorrhoid, thrombosed 06/27/2014  . 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 Ulcerated hemorrhoid 06/27/2014  . Severe obstructive sleep apnea 06/27/2014  . Squamous cell cancer, anus   . Rectal ulcer 05/17/2014    Past Surgical History  Procedure Laterality Date  . Foot surgery Right   . Tubal ligation    .  Eye surgery Bilateral   . Mouth surgery  2002  . Rectal biopsy N/A 05/08/2014    Procedure: BIOPSY RECTAL;  Surgeon: Marlyce Huge, MD;  Location: [facility] ORS;  Service: General;  Laterality: N/A;  . Evaluation under anesthesia with hemorrhoidectomy N/A 05/08/2014    Procedure: EXAM UNDER ANESTHESIA WITH HEMORRHOIDECTOMY;  Surgeon: Marlyce Huge, MD;  Location: [facility] ORS;  Service: General;  Laterality: N/A;  . Portacath placement N/A 05/20/2014    Procedure: INSERTION PORT-A-CATH;  Surgeon: Florene Glen, MD;  Location: [facility] ORS;  Service: General;  Laterality: N/A;  . Laparoscopic diverted colostomy N/A 05/26/2014    Procedure: LAPAROSCOPIC DIVERTED COLOSTOMY;  Surgeon: Marlyce Huge, MD;  Location: [facility] ORS;  Service: General;  Laterality: N/A;    Current Outpatient Rx  Name  Route  Sig  Dispense  Refill  . albuterol (PROVENTIL HFA;VENTOLIN HFA) 108 (90 BASE) MCG/ACT inhaler   Inhalation   Inhale 1 puff into the lungs every 6 (six) hours as needed for wheezing or shortness of breath.         Marland Kitchen albuterol (PROVENTIL) (2.5 MG/3ML) 0.083% nebulizer solution   Inhalation   Inhale 3 mLs (2.5 mg total) into the lungs every 6 (six) hours as needed for wheezing or shortness of breath. Patient not taking: Reported on 08/04/2014   75 mL   12   . Alum & Mag Hydroxide-Simeth (MAGIC MOUTHWASH) SOLN   Oral   Take 5 mLs by mouth 4 (four) times daily. Patient not taking: Reported on 08/04/2014   480 mL   3     Swish and spit   . azithromycin (ZITHROMAX Z-PAK) 250 MG tablet      Take 2 tablets (500 mg) on  Day 1,  followed by 1 tablet (250 mg) once daily on Days 2 through 5.   6 each   0   . budesonide-formoterol (SYMBICORT) 160-4.5 MCG/ACT inhaler   Inhalation   Inhale 2 puffs into the lungs 2 (two) times daily.   1 Inhaler   12   . Calcium Carb-Cholecalciferol (CALCIUM 600+D) 600-800 MG-UNIT TABS   Oral   Take 1 tablet by mouth 2 (two) times daily.          . clindamycin (CLEOCIN) 300 MG capsule   Oral   Take 300 mg by mouth 3 (three) times daily.         . clonazePAM (KLONOPIN) 1 MG tablet   Oral   Take 1 mg by mouth 3 (three) times daily as needed for anxiety.         . docusate sodium (COLACE) 100 MG capsule   Oral   Take 2 capsules (200 mg total) by mouth 2 (two) times daily. Patient taking differently: Take 200 mg by mouth 2 (two) times daily as needed for mild constipation.    120 capsule   0   . DULoxetine (CYMBALTA) 60 MG capsule   Oral   Take  60 mg by mouth every evening.         . escitalopram (LEXAPRO) 20 MG tablet   Oral   Take 20 mg by mouth at bedtime.         Marland Kitchen etodolac (LODINE) 500 MG tablet   Oral   Take 500 mg by mouth 2 (two) times daily.         Marland Kitchen lidocaine (XYLOCAINE) 2 % jelly   Topical   Apply 1 application topically as needed (for pain).         Marland Kitchen  ondansetron (ZOFRAN) 8 MG tablet   Oral   Take 1 tablet (8 mg total) by mouth 2 (two) times daily. Start the day after chemo for 2 days. Then take as needed for nausea or vomiting. Patient not taking: Reported on 08/04/2014   30 tablet   1   . oxybutynin (DITROPAN) 5 MG tablet   Oral   Take 5 mg by mouth 2 (two) times daily.         Marland Kitchen oxyCODONE-acetaminophen (PERCOCET) 10-325 MG per tablet   Oral   Take 1 tablet by mouth every 6 (six) hours as needed for pain.   40 tablet   0   . oxyCODONE-acetaminophen (PERCOCET/ROXICET) 5-325 MG per tablet   Oral   Take 1-2 tablets by mouth every 6 (six) hours as needed for severe pain. Patient not taking: Reported on 08/04/2014   60 tablet   0   . polyethylene glycol powder (GLYCOLAX/MIRALAX) powder      1 cap full in a full glass of water, once a day for 5 days. Patient not taking: Reported on 08/04/2014   255 g   0   . potassium chloride SA (K-DUR,KLOR-CON) 10 MEQ tablet   Oral   Take 1 tablet (10 mEq total) by mouth 2 (two) times daily. for 3 days then 1 pill a day Patient taking differently: Take 10 mEq by mouth 2 (two) times daily.    20 tablet   0   . predniSONE (DELTASONE) 20 MG tablet   Oral   Take 2 tablets (40 mg total) by mouth daily.   10 tablet   0   . psyllium (METAMUCIL) 58.6 % powder   Oral   Take 1 packet by mouth 2 (two) times daily as needed (for constipation).         . ranitidine (ZANTAC) 150 MG tablet   Oral   Take 150 mg by mouth 2 (two) times daily.         Marland Kitchen senna (SENOKOT) 8.6 MG tablet   Oral   Take 2 tablets by mouth 2 (two) times daily as needed for  constipation.         . simvastatin (ZOCOR) 40 MG tablet   Oral   Take 40 mg by mouth at bedtime.          Marland Kitchen tiotropium (SPIRIVA) 18 MCG inhalation capsule   Inhalation   Place 18 mcg into inhaler and inhale daily.         Marland Kitchen zolpidem (AMBIEN) 5 MG tablet   Oral   Take 5 mg by mouth at bedtime as needed for sleep.           Allergies Sulfa antibiotics  Family History  Problem Relation Age of Onset  . Cancer Maternal Aunt   . Cancer Paternal Uncle     Social History History  Substance Use Topics  . Smoking status: Current Every Day Smoker -- 0.25 packs/day for 44 years    Types: Cigarettes  . Smokeless tobacco: Never Used  . Alcohol Use: Yes     Comment: 1 drink every 2-3 times/year    Review of Systems  Constitutional: Negative for fever. ENT: Negative for sore throat. Cardiovascular: Negative for chest pain. Respiratory: Negative for shortness of breath. Gastrointestinal: Negative for abdominal pain, vomiting and diarrhea. Notable for history of anal cancer with anal leakage. Genitourinary: Negative for dysuria. Musculoskeletal: No myalgias or injuries. Skin: Skin breakdown and changes from radiation treatment ill-appearing and rectal area.  Neurological: Negative for headaches Psychological: Depression, thoughts of wishing to die, see history of present illness  10-point ROS otherwise negative.  ____________________________________________   PHYSICAL EXAM:  VITAL SIGNS: ED Triage Vitals  Enc Vitals Group     BP 08/05/14 1810 128/82 mmHg     Pulse Rate 08/05/14 1810 99     Resp 08/05/14 1810 20     Temp 08/05/14 1810 98.6 F (37 C)     Temp Source 08/05/14 1810 Oral     SpO2 08/05/14 1810 97 %     Weight 08/05/14 1810 170 lb (77.111 kg)     Height 08/05/14 1810 5' 8.5" (1.74 m)     Head Cir --      Peak Flow --      Pain Score 08/05/14 1815 9     Pain Loc --      Pain Edu? --      Excl. in [place]? --     Constitutional:  Alert and  oriented. Provides a long history. Clearly has a lot to speak about. No acute distress. ENT   Head: Normocephalic and atraumatic.   Nose: No congestion/rhinnorhea.   Mouth/Throat: Mucous membranes are moist. Cardiovascular: Normal rate, regular rhythm, no murmur noted Respiratory:  Normal respiratory effort, no tachypnea.    Breath sounds are clear and equal bilaterally.  Gastrointestinal: Soft and nontender. No distention.  Back: No muscle spasm, no tenderness, no CVA tenderness. Musculoskeletal: No deformity noted. Nontender with normal range of motion in all extremities.  No noted edema. Neurologic:  Normal speech and language. No gross focal neurologic deficits are appreciated.  Skin:  Skin is warm, dry. No rash noted. The patient does have an ulceration is approximately 6-7 cm across on the right buttocks near the perianal area. Psychiatric: Patient appears to be having some notable and understandable difficulty coping with her current medical and social situation. She appears frustrated. She is slightly hyperverbal but remains on track for the most part. She denies active thoughts of suicide but feels as though it would be better if she was no longer alive. ____________________________________________    LABS (pertinent positives/negatives)  Labs Reviewed  COMPREHENSIVE METABOLIC PANEL - Abnormal; Notable for the following:    Potassium 2.9 (*)    Glucose, Bld 119 (*)    Calcium 8.8 (*)    Albumin 2.9 (*)    ALT 11 (*)    All other components within normal limits  ACETAMINOPHEN LEVEL - Abnormal; Notable for the following:    Acetaminophen (Tylenol), Serum <10 (*)    All other components within normal limits  CBC - Abnormal; Notable for the following:    RBC 3.72 (*)    Hemoglobin 11.8 (*)    RDW 18.9 (*)    All other components within normal limits  ETHANOL  SALICYLATE LEVEL  URINE DRUG SCREEN, QUALITATIVE ([facility] ONLY)      ____________________________________________   INITIAL IMPRESSION / ASSESSMENT AND PLAN / ED COURSE  Pertinent labs & imaging results that were available during my care of the patient were reviewed by me and considered in my medical decision making (see chart for details).  Difficult circumstance for this 59 year old female with a significant cancer issue. I believe the majority of her psychiatric issues are due to social stressors. She reports she does not wish to keep on living, but does not appear to be specifically or outright suicidal. I think she feels under appreciated by her daughter.   Given her statements  of not wishing to live and her request to commit herself, we have placed a consult for psychiatry for further psychiatric evaluation.  While she has notable chronic medical concerns, we do not find any acute medical issues that warrant medical admission at this time.  ____________________________________________   FINAL CLINICAL IMPRESSION(S) / ED DIAGNOSES  Final diagnoses:  Depression  Suicidal thoughts  Anal cancer  Anxiety       Ahmed Prima, MD 08/05/14 [phone]
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
## 2014-08-06 DIAGNOSIS — F329 Major depressive disorder, single episode, unspecified: Secondary | ICD-10-CM | POA: Diagnosis not present

## 2014-08-06 LAB — BASIC METABOLIC PANEL
ANION GAP: 6 (ref 5–15)
BUN: 12 mg/dL (ref 6–20)
CHLORIDE: 109 mmol/L (ref 101–111)
CO2: 28 mmol/L (ref 22–32)
Calcium: 8.9 mg/dL (ref 8.9–10.3)
Creatinine, Ser: 0.57 mg/dL (ref 0.44–1.00)
GLUCOSE: 113 mg/dL — AB (ref 65–99)
POTASSIUM: 4.1 mmol/L (ref 3.5–5.1)
Sodium: 143 mmol/L (ref 135–145)

## 2014-08-06 MED ORDER — FAMOTIDINE 20 MG PO TABS
ORAL_TABLET | ORAL | Status: AC
  Administered 2014-08-06: 20 mg via ORAL
  Filled 2014-08-06: qty 1

## 2014-08-06 MED ORDER — DULOXETINE HCL 60 MG PO CPEP
60.0000 mg | ORAL_CAPSULE | Freq: Every day | ORAL | Status: DC
  Administered 2014-08-06 – 2014-08-07 (×2): 60 mg via ORAL
  Filled 2014-08-06 (×2): qty 1

## 2014-08-06 MED ORDER — PREDNISONE 20 MG PO TABS
40.0000 mg | ORAL_TABLET | Freq: Every day | ORAL | Status: DC
  Administered 2014-08-06 – 2014-08-07 (×2): 40 mg via ORAL
  Filled 2014-08-06 (×2): qty 2

## 2014-08-06 MED ORDER — MAGIC MOUTHWASH
5.0000 mL | Freq: Four times a day (QID) | ORAL | Status: DC
  Administered 2014-08-06 – 2014-08-07 (×5): 5 mL via ORAL
  Filled 2014-08-06 (×11): qty 5

## 2014-08-06 MED ORDER — ZOLPIDEM TARTRATE 5 MG PO TABS
10.0000 mg | ORAL_TABLET | Freq: Every day | ORAL | Status: DC
  Administered 2014-08-06: 10 mg via ORAL
  Filled 2014-08-06: qty 2

## 2014-08-06 MED ORDER — FAMOTIDINE 20 MG PO TABS
20.0000 mg | ORAL_TABLET | Freq: Two times a day (BID) | ORAL | Status: DC
  Administered 2014-08-06 – 2014-08-07 (×3): 20 mg via ORAL
  Filled 2014-08-06 (×4): qty 1

## 2014-08-06 MED ORDER — CLONAZEPAM 1 MG PO TABS: 1.0000 mg | ORAL_TABLET | Freq: Three times a day (TID) | ORAL | Status: DC | PRN

## 2014-08-06 MED ORDER — AZITHROMYCIN 250 MG PO TABS
250.0000 mg | ORAL_TABLET | Freq: Every day | ORAL | Status: DC
  Administered 2014-08-06 – 2014-08-07 (×2): 250 mg via ORAL
  Filled 2014-08-06 (×5): qty 1

## 2014-08-06 MED ORDER — POTASSIUM CHLORIDE CRYS ER 20 MEQ PO TBCR
40.0000 meq | EXTENDED_RELEASE_TABLET | Freq: Once | ORAL | Status: AC
  Administered 2014-08-06: 40 meq via ORAL
  Filled 2014-08-06: qty 2

## 2014-08-06 MED ORDER — ESCITALOPRAM OXALATE 10 MG PO TABS
20.0000 mg | ORAL_TABLET | Freq: Every day | ORAL | Status: DC
  Administered 2014-08-06 – 2014-08-07 (×2): 20 mg via ORAL
  Filled 2014-08-06 (×2): qty 2

## 2014-08-06 NOTE — ED Notes (Signed)
ED BHU [place]  Is the patient under IVC or is there intent for IVC: No Is the patient medically cleared: Yes.  Is there vacancy in the ED BHU: Yes.  Is the population mix appropriate for patient: Yes.  Is the patient awaiting placement in inpatient or outpatient setting: No   Has the patient had a psychiatric consult: Yes.  Survey of unit performed for contraband, proper placement and condition of furniture, tampering with fixtures in bathroom, shower, and each patient room: Yes. ; Findings: All clear  APPEARANCE/BEHAVIOR  calm, cooperative and adequate rapport can be established  NEURO ASSESSMENT  Orientation: time, place and person  Hallucinations: No.None noted (Hallucinations)  Speech: Normal  Gait: normal  RESPIRATORY ASSESSMENT  WNL  CARDIOVASCULAR ASSESSMENT  WNL  GASTROINTESTINAL ASSESSMENT  Pt denies any abnormal issues. 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  EXTREMITIES  WNL  PLAN OF CARE  Provide calm/safe environment. Vital signs assessed twice daily. ED BHU Assessment once each 12-hour shift. Collaborate with intake RN daily or as condition indicates. Assure the ED provider has rounded once each shift. Provide and encourage hygiene. Provide redirection as needed. Assess for escalating behavior; address immediately and inform ED provider.  Assess family dynamic and appropriateness for visitation as needed: Yes. ; If necessary, describe findings:  Educate the patient/family about BHU procedures/visitation: Yes. ; If necessary, describe findings: Pt is calm and cooperative at this time. Pt understanding and accepting of unit procedures/rules. Will continue to monitor.  BEHAVIORAL HEALTH ROUNDING  Patient sleeping: No.  Patient alert and oriented: yes  Behavior appropriate: Yes. ; If no, describe:  Nutrition and fluids offered: Yes  Toileting and hygiene offered: Yes  Sitter present: not applicable  Law enforcement present: Yes ODS  ENVIRONMENTAL  ASSESSMENT  Potentially harmful objects out of patient reach: Yes.  Personal belongings secured: Yes.  Patient dressed in hospital provided attire only: Yes.  Plastic bags out of patient reach: Yes.  Patient care equipment (cords, cables, call bells, lines, and drains) shortened, removed, or accounted for: Yes.  Equipment and supplies removed from bottom of stretcher: Yes.  Potentially toxic materials out of patient reach: Yes.  Sharps container removed or out of patient reach: Yes.

## 2014-08-06 NOTE — ED Notes (Signed)
BEHAVIORAL HEALTH ROUNDING Patient sleeping: Yes.   Patient alert and oriented: yes Behavior appropriate: Yes.  ; If no, describe:  Nutrition and fluids offered: Yes  Toileting and hygiene offered: Yes  Sitter present: yes Law enforcement present: Yes ODS

## 2014-08-06 NOTE — ED Notes (Signed)
BEHAVIORAL HEALTH ROUNDING  Patient sleeping: No.  Patient alert and oriented: yes  Behavior appropriate: Yes. ; If no, describe:  Nutrition and fluids offered: Yes  Toileting and hygiene offered: Yes  Sitter present: not applicable  Law enforcement present: Yes ODS

## 2014-08-06 NOTE — ED Notes (Signed)
ED BHU [name] Is the patient under IVC or is there intent for IVC: No. Is the patient medically cleared: Yes.   Is there vacancy in the ED BHU: Yes.   Is the population mix appropriate for patient: Yes.   Is the patient awaiting placement in inpatient or outpatient setting: Yes.   Has the patient had a psychiatric consult: No. Survey of unit performed for contraband, proper placement and condition of furniture, tampering with fixtures in bathroom, shower, and each patient room: Yes.  ; Findings:  APPEARANCE/BEHAVIOR calm, cooperative and adequate rapport can be established NEURO ASSESSMENT Orientation: time, place and person Hallucinations: No.None noted (Hallucinations) Speech: Normal Gait: normal RESPIRATORY ASSESSMENT Normal expansion.  Clear to auscultation.  No rales, rhonchi, or wheezing. CARDIOVASCULAR ASSESSMENT regular rate and rhythm, S1, S2 normal, no murmur, click, rub or gallop GASTROINTESTINAL ASSESSMENT soft, nontender, BS WNL, no r/g EXTREMITIES normal strength, tone, and muscle mass PLAN OF CARE Provide calm/safe environment. Vital signs assessed twice daily. ED BHU Assessment once each 12-hour shift. Collaborate with intake RN daily or as condition indicates. Assure the ED provider has rounded once each shift. Provide and encourage hygiene. Provide redirection as needed. Assess for escalating behavior; address immediately and inform ED provider.  Assess family dynamic and appropriateness for visitation as needed: Yes.  ; If necessary, describe findings:  Educate the patient/family about BHU procedures/visitation: Yes.  ; If necessary, describe findings:

## 2014-08-06 NOTE — BH Assessment (Signed)
Per Dr. Franchot Mimes patient will be assessed by the Psychiatrist for a final disposition.

## 2014-08-06 NOTE — ED Notes (Signed)
BEHAVIORAL HEALTH ROUNDING Patient sleeping: Yes.   Patient alert and oriented: not applicable SLEEPING Behavior appropriate: Yes.  ; If no, describe: SLEEPING Nutrition and fluids offered: No SLEEPING Toileting and hygiene offered: NoSLEEPING Sitter present: not applicable Law enforcement present: Yes ODS

## 2014-08-06 NOTE — ED Notes (Signed)
BEHAVIORAL HEALTH ROUNDING Patient sleeping: No. Patient alert and oriented: yes Behavior appropriate: Yes.  ; If no, describe:  Nutrition and fluids offered: Yes  Toileting and hygiene offered: Yes  Sitter present: yes Law enforcement present: Yes ODS

## 2014-08-06 NOTE — ED Notes (Signed)
Report given to Ann, RN.

## 2014-08-06 NOTE — ED Notes (Signed)
BEHAVIORAL HEALTH ROUNDING Patient sleeping: No. Patient alert and oriented: yes Behavior appropriate: Yes.  ; If no, describe:   Nutrition and fluids offered: Yes  Toileting and hygiene offered: Yes  Sitter present: no Law enforcement present: Yes  and ODS  

## 2014-08-06 NOTE — ED Notes (Signed)
ENVIRONMENTAL ASSESSMENT  Potentially harmful objects out of patient reach: Yes.  Personal belongings secured: Yes.  Patient dressed in hospital provided attire only: Yes.  Plastic bags out of patient reach: Yes.  Patient care equipment (cords, cables, call bells, lines, and drains) shortened, removed, or accounted for: Yes.  Equipment and supplies removed from bottom of stretcher: Yes.  Potentially toxic materials out of patient reach: Yes.  Sharps container removed or out of patient reach: Yes.   Pt brought into ED BHU via sally port and wand with metal detector for safety by ODS officer. Patient oriented to unit/care area: Pt informed of unit policies and procedures.  Informed that, for their safety, care areas are designed for safety and monitored by security cameras at all times; and visiting hours explained to patient. Patient verbalizes understanding, and verbal contract for safety obtained.Pt shown to their room.   BEHAVIORAL HEALTH ROUNDING  Patient sleeping: No.  Patient alert and oriented: yes  Behavior appropriate: Yes. ; If no, describe:  Nutrition and fluids offered: Yes  Toileting and hygiene offered: Yes  Sitter present: not applicable  Law enforcement present: Yes ODS

## 2014-08-06 NOTE — ED Notes (Signed)

## 2014-08-06 NOTE — ED Notes (Signed)
BEHAVIORAL HEALTH ROUNDING Patient sleeping: Yes.   Patient alert and oriented: yes Behavior appropriate: Yes.  ; If no, describe:  Nutrition and fluids offered: Yes  Toileting and hygiene offered: Yes  Sitter present: yes Law enforcement present: Yes  

## 2014-08-06 NOTE — ED Notes (Addendum)

## 2014-08-06 NOTE — ED Notes (Addendum)
BEHAVIORAL HEALTH ROUNDING Patient sleeping: No. Patient alert and oriented: yes Behavior appropriate: Yes.  ; If no, describe:   Nutrition and fluids offered: Yes  Toileting and hygiene offered: Yes  Sitter present: no Law enforcement present: Yes  and ODS  

## 2014-08-06 NOTE — Consult Note (Signed)
[facility] Consult   Reason for Consult:  Followup Referring Physician:  ER Patient Identification: [name] MRN:  [id number] Principal Diagnosis: <principal problem not specified> Diagnosis:   Patient Active Problem List   Diagnosis Date Noted  . Hypokalemia [E87.6] 07/20/2014  . Anal cancer [C21.0] 06/27/2014  . Anal fissure [K60.2] 06/27/2014  . Anxiety [F41.9] 06/27/2014  . Airway hyperreactivity [J45.909] 06/27/2014  . H/O manic depressive disorder [Z86.59] 06/27/2014  . CAFL (chronic airflow limitation) [J44.9] 06/27/2014  . Clinical depression [F32.9] 06/27/2014  . Deep vein thrombosis [I82.409] 06/27/2014  . External hemorrhoid [K64.8] 06/27/2014  . Myalgia and myositis [M79.1, M60.9] 06/27/2014  . Acid reflux [K21.9] 06/27/2014  . Hemorrhoid [K64.9] 06/27/2014  . BP (high blood pressure) [I10] 06/27/2014  . HLD (hyperlipidemia) [E78.5] 06/27/2014  . Adult hypothyroidism [E03.9] 06/27/2014  . LBP (low back pain) [M54.5] 06/27/2014  . Mass of perianal area [R19.8] 06/27/2014  . External hemorrhoid, thrombosed [K64.5] 06/27/2014  . 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 Ulcerated hemorrhoid [K64.8] 06/27/2014  . Severe obstructive sleep apnea [G47.33] 06/27/2014  . 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 Rectal ulcer [K62.6] 05/17/2014    Total Time spent with patient: 1 hour  Subjective:   [name] is a 59 y.o. female patient admitted with a known history of mental illness and living a a boarding home and shares a room with [name] daughter who is not helpful and daughter lives in a room just opposite and is not helpful. Pt has been feeling inrceasingly  depressed and an wanted to o.d herself on prescription meds and called [facility] for help.   HPI:  Long H/O mental illness and is being followed by [facility] by Dr Kasandra Knudsen for more than 2 yrs until her Insurence would not allow.   Pt was stabilized on foll meds. Cymbalta 60 mgs po daily. Lexapro 20 mgs  po daily. Ambien 10 mgs po daily. Klonopin one mg po tid. Pt has Stage 3 rectal cancer and is being followed by [facility].  Being followed by PCP Dr Chancy Milroy [facility]. Past psych history Had many inpt hospitasalizations to Psychiatry at [facility]. H/O suicide attemtps by o.d on pills, Has apt coming up with Dr. Jimmye Norman at [place] pt [place] on 09/01/2014 Alcohol and drugs denied. Smokes cigs but is cutting down to one pack lasts for 3 days. HPI Elements: f and she is in with her granddaughter is rom ce     Past Medical History:  Past Medical History  Diagnosis Date  . Schizophrenia   . Asthma   . GERD (gastroesophageal reflux disease)   . Anxiety   . Depression   . Bipolar disorder   . COPD (chronic obstructive pulmonary disease)   . Occasional tremors     right hand  . PTSD (post-traumatic stress disorder)   . Shortness of breath dyspnea   . Fibromyalgia   . DVT (deep venous thrombosis) 2011    RUE  . Thyroid nodule   . DDD (degenerative disc disease), lumbar   . Spinal stenosis   . Peripheral neuropathy   . Rotator cuff tear     right  . Pneumonia 2011  . Hypothyroidism     no meds currently  . Anemia     during pregnancy only  . Hypertension     Off meds x 15 years-well controlled now per pt  . Squamous cell cancer, anus   . Severe obstructive sleep  apnea 06/27/2014    Past Surgical History  Procedure Laterality Date  . Foot surgery Right   . Tubal ligation    . Eye surgery Bilateral   . Mouth surgery  2002  . Rectal biopsy N/A 05/08/2014    Procedure: BIOPSY RECTAL;  Surgeon: Marlyce Huge, MD;  Location: [facility] ORS;  Service: General;  Laterality: N/A;  . Evaluation under anesthesia with hemorrhoidectomy N/A 05/08/2014    Procedure: EXAM UNDER ANESTHESIA WITH HEMORRHOIDECTOMY;  Surgeon: Marlyce Huge, MD;  Location: [facility] ORS;  Service: General;  Laterality: N/A;  . Portacath placement N/A 05/20/2014    Procedure: INSERTION PORT-A-CATH;  Surgeon:  Florene Glen, MD;  Location: [facility] ORS;  Service: General;  Laterality: N/A;  . Laparoscopic diverted colostomy N/A 05/26/2014    Procedure: LAPAROSCOPIC DIVERTED COLOSTOMY;  Surgeon: Marlyce Huge, MD;  Location: [facility] ORS;  Service: General;  Laterality: N/A;   Family History:  Family History  Problem Relation Age of Onset  . Cancer Maternal Aunt   . Cancer Paternal Uncle    Social History:  History  Alcohol Use  . Yes    Comment: 1 drink every 2-3 times/year     History  Drug Use No    Comment: Pt denies but has + UDS for marijuana on 12-2013    History   Social History  . Marital Status: Legally Separated    Spouse Name: N/A  . Number of Children: N/A  . Years of Education: N/A   Social History Main Topics  . Smoking status: Current Every Day Smoker -- 0.25 packs/day for 44 years    Types: Cigarettes  . Smokeless tobacco: Never Used  . Alcohol Use: Yes     Comment: 1 drink every 2-3 times/year  . Drug Use: No     Comment: Pt denies but has + UDS for marijuana on 12-2013  . Sexual Activity: Not on file   Other Topics Concern  . None   Social History Narrative   Additional Social History:    History of alcohol / drug use?: No history of alcohol / drug abuse                     Allergies:   Allergies  Allergen Reactions  . Sulfa Antibiotics Hives    Labs:  Results for orders placed or performed during the hospital encounter of 08/05/14 (from the past 48 hour(s))  Comprehensive metabolic panel     Status: Abnormal   Collection Time: 08/05/14  6:20 PM  Result Value Ref Range   Sodium 140 135 - 145 mmol/L   Potassium 2.9 (LL) 3.5 - 5.1 mmol/L    Comment: CRITICAL RESULT CALLED TO, READ BACK BY AND VERIFIED WITH [facility] SANDERS 08/05/14 1845 BOD    Chloride 107 101 - 111 mmol/L   CO2 25 22 - 32 mmol/L   Glucose, Bld 119 (H) 65 - 99 mg/dL   BUN 12 6 - 20 mg/dL   Creatinine, Ser 0.64 0.44 - 1.00 mg/dL   Calcium 8.8 (L) 8.9 - 10.3 mg/dL    Total Protein 6.9 6.5 - 8.1 g/dL   Albumin 2.9 (L) 3.5 - 5.0 g/dL   AST 25 15 - 41 U/L   ALT 11 (L) 14 - 54 U/L   Alkaline Phosphatase 49 38 - 126 U/L   Total Bilirubin 0.5 0.3 - 1.2 mg/dL   GFR calc non Af Amer >60 >60 mL/min   GFR calc Af Amer >60 >60  mL/min    Comment: (NOTE) The eGFR has been calculated using the CKD EPI equation. This calculation has not been validated in all clinical situations. eGFR's persistently <60 mL/min signify possible Chronic Kidney Disease.    Anion gap 8 5 - 15  Ethanol (ETOH)     Status: None   Collection Time: 08/05/14  6:20 PM  Result Value Ref Range   Alcohol, Ethyl (B) <5 <5 mg/dL    Comment:        LOWEST DETECTABLE LIMIT FOR SERUM ALCOHOL IS 5 mg/dL FOR MEDICAL PURPOSES ONLY   Salicylate level     Status: None   Collection Time: 08/05/14  6:20 PM  Result Value Ref Range   Salicylate Lvl <4.9 2.8 - 30.0 mg/dL  Acetaminophen level     Status: Abnormal   Collection Time: 08/05/14  6:20 PM  Result Value Ref Range   Acetaminophen (Tylenol), Serum <10 (L) 10 - 30 ug/mL    Comment:        THERAPEUTIC CONCENTRATIONS VARY SIGNIFICANTLY. A RANGE OF 10-30 ug/mL MAY BE AN EFFECTIVE CONCENTRATION FOR MANY PATIENTS. HOWEVER, SOME ARE BEST TREATED AT CONCENTRATIONS OUTSIDE THIS RANGE. ACETAMINOPHEN CONCENTRATIONS >150 ug/mL AT 4 HOURS AFTER INGESTION AND >50 ug/mL AT 12 HOURS AFTER INGESTION ARE OFTEN ASSOCIATED WITH TOXIC REACTIONS.   CBC     Status: Abnormal   Collection Time: 08/05/14  6:20 PM  Result Value Ref Range   WBC 5.1 3.6 - 11.0 K/uL   RBC 3.72 (L) 3.80 - 5.20 MIL/uL   Hemoglobin 11.8 (L) 12.0 - 16.0 g/dL   HCT 35.1 35.0 - 47.0 %   MCV 94.4 80.0 - 100.0 fL   MCH 31.8 26.0 - 34.0 pg   MCHC 33.7 32.0 - 36.0 g/dL   RDW 18.9 (H) 11.5 - 14.5 %   Platelets 171 150 - 440 K/uL  Urine Drug Screen, Qualitative ([facility] only)     Status: None   Collection Time: 08/05/14  6:20 PM  Result Value Ref Range   Tricyclic, Ur Screen NONE  DETECTED NONE DETECTED   Amphetamines, Ur Screen NONE DETECTED NONE DETECTED   MDMA (Ecstasy)Ur Screen NONE DETECTED NONE DETECTED   Cocaine Metabolite,Ur [place] NONE DETECTED NONE DETECTED   Opiate, Ur Screen NONE DETECTED NONE DETECTED   Phencyclidine (PCP) Ur S NONE DETECTED NONE DETECTED   Cannabinoid 50 Ng, Ur [place] NONE DETECTED NONE DETECTED   Barbiturates, Ur Screen NONE DETECTED NONE DETECTED   Benzodiazepine, Ur Scrn NONE DETECTED NONE DETECTED   Methadone Scn, Ur NONE DETECTED NONE DETECTED    Comment: (NOTE) 675  Tricyclics, urine               Cutoff 1000 ng/mL 200  Amphetamines, urine             Cutoff 1000 ng/mL 300  MDMA (Ecstasy), urine           Cutoff 500 ng/mL 400  Cocaine Metabolite, urine       Cutoff 300 ng/mL 500  Opiate, urine                   Cutoff 300 ng/mL 600  Phencyclidine (PCP), urine      Cutoff 25 ng/mL 700  Cannabinoid, urine              Cutoff 50 ng/mL 800  Barbiturates, urine             Cutoff 200 ng/mL 900  Benzodiazepine, urine  Cutoff 200 ng/mL 1000 Methadone, urine                Cutoff 300 ng/mL 1100 1200 The urine drug screen provides only a preliminary, unconfirmed 1300 analytical test result and should not be used for non-medical 1400 purposes. Clinical consideration and professional judgment should 1500 be applied to any positive drug screen result due to possible 1600 interfering substances. A more specific alternate chemical method 1700 must be used in order to obtain a confirmed analytical result.  1800 Gas chromato graphy / mass spectrometry (GC/MS) is the preferred 1900 confirmatory method.   Basic metabolic panel     Status: Abnormal   Collection Time: 08/06/14 10:01 AM  Result Value Ref Range   Sodium 143 135 - 145 mmol/L   Potassium 4.1 3.5 - 5.1 mmol/L   Chloride 109 101 - 111 mmol/L   CO2 28 22 - 32 mmol/L   Glucose, Bld 113 (H) 65 - 99 mg/dL   BUN 12 6 - 20 mg/dL   Creatinine, Ser 0.57 0.44 - 1.00 mg/dL   Calcium  8.9 8.9 - 10.3 mg/dL   GFR calc non Af Amer >60 >60 mL/min   GFR calc Af Amer >60 >60 mL/min    Comment: (NOTE) The eGFR has been calculated using the CKD EPI equation. This calculation has not been validated in all clinical situations. eGFR's persistently <60 mL/min signify possible Chronic Kidney Disease.    Anion gap 6 5 - 15    Vitals: Blood pressure 102/59, pulse 92, temperature 98.1 F (36.7 C), temperature source Oral, resp. rate 18, height 5' 8.5" (1.74 m), weight 77.111 kg (170 lb), SpO2 98 %.  Risk to Self: Suicidal Ideation: Yes-Currently Present Suicidal Intent: Yes-Currently Present Is patient at risk for suicide?: Yes Suicidal Plan?: Yes-Currently Present Specify Current Suicidal Plan: Overdose on medication  Access to Means: Yes Specify Access to Suicidal Means: Pills at home What has been your use of drugs/alcohol within the last 12 months?: None Reported How many times?: 0 Other Self Harm Risks: None Reported Triggers for Past Attempts:  (N/A) Intentional Self Injurious Behavior: None Risk to Others: Homicidal Ideation: No Thoughts of Harm to Others: No Current Homicidal Intent: No Current Homicidal Plan: No Access to Homicidal Means: No Identified Victim: None Reported History of harm to others?: No Assessment of Violence: None Noted Violent Behavior Description: None Reported Does patient have access to weapons?: No Criminal Charges Pending?: No Does patient have a court date: No Prior Inpatient Therapy: Prior Inpatient Therapy: No Prior Therapy Dates: NA Prior Therapy Facilty/Provider(s): NA Reason for Treatment: NA Prior Outpatient Therapy: Prior Outpatient Therapy: Yes Prior Therapy Dates: Ongoing  Prior Therapy Facilty/Provider(s): [facility]  Reason for Treatment: Medication Management Does patient have an ACCT team?: No Does patient have Intensive In-House Services?  : No Does patient have [facility] services? : No Does patient  have P4CC services?: No  Current Facility-Administered Medications  Medication Dose Route Frequency Provider Last Rate Last Dose  . azithromycin (ZITHROMAX) tablet 250 mg  250 mg Oral Daily Carrie Mew, MD   250 mg at 08/06/14 1013  . clonazePAM (KLONOPIN) tablet 1 mg  1 mg Oral TID PRN Carrie Mew, MD      . famotidine (PEPCID) tablet 20 mg  20 mg Oral BID Carrie Mew, MD      . magic mouthwash  5 mL Oral QID Carrie Mew, MD   5 mL at 08/06/14 1114  . oxyCODONE-acetaminophen (PERCOCET/ROXICET)  5-325 MG per tablet 2 tablet  2 tablet Oral Q6H PRN Ahmed Prima, MD   2 tablet at 08/06/14 1017  . predniSONE (DELTASONE) tablet 40 mg  40 mg Oral Daily Carrie Mew, MD   40 mg at 08/06/14 1013   Current Outpatient Prescriptions  Medication Sig Dispense Refill  . albuterol (PROVENTIL HFA;VENTOLIN HFA) 108 (90 BASE) MCG/ACT inhaler Inhale 1 puff into the lungs every 6 (six) hours as needed for wheezing or shortness of breath.    Marland Kitchen albuterol (PROVENTIL) (2.5 MG/3ML) 0.083% nebulizer solution Inhale 3 mLs (2.5 mg total) into the lungs every 6 (six) hours as needed for wheezing or shortness of breath. (Patient not taking: Reported on 08/04/2014) 75 mL 12  . Alum & Mag Hydroxide-Simeth (MAGIC MOUTHWASH) SOLN Take 5 mLs by mouth 4 (four) times daily. (Patient not taking: Reported on 08/04/2014) 480 mL 3  . azithromycin (ZITHROMAX Z-PAK) 250 MG tablet Take 2 tablets (500 mg) on  Day 1,  followed by 1 tablet (250 mg) once daily on Days 2 through 5. 6 each 0  . budesonide-formoterol (SYMBICORT) 160-4.5 MCG/ACT inhaler Inhale 2 puffs into the lungs 2 (two) times daily. 1 Inhaler 12  . Calcium Carb-Cholecalciferol (CALCIUM 600+D) 600-800 MG-UNIT TABS Take 1 tablet by mouth 2 (two) times daily.     . clindamycin (CLEOCIN) 300 MG capsule Take 300 mg by mouth 3 (three) times daily.    . clonazePAM (KLONOPIN) 1 MG tablet Take 1 mg by mouth 3 (three) times daily as needed for anxiety.    .  docusate sodium (COLACE) 100 MG capsule Take 2 capsules (200 mg total) by mouth 2 (two) times daily. (Patient taking differently: Take 200 mg by mouth 2 (two) times daily as needed for mild constipation. ) 120 capsule 0  . DULoxetine (CYMBALTA) 60 MG capsule Take 60 mg by mouth every evening.    . escitalopram (LEXAPRO) 20 MG tablet Take 20 mg by mouth at bedtime.    Marland Kitchen etodolac (LODINE) 500 MG tablet Take 500 mg by mouth 2 (two) times daily.    Marland Kitchen lidocaine (XYLOCAINE) 2 % jelly Apply 1 application topically as needed (for pain).    . ondansetron (ZOFRAN) 8 MG tablet Take 1 tablet (8 mg total) by mouth 2 (two) times daily. Start the day after chemo for 2 days. Then take as needed for nausea or vomiting. (Patient not taking: Reported on 08/04/2014) 30 tablet 1  . oxybutynin (DITROPAN) 5 MG tablet Take 5 mg by mouth 2 (two) times daily.    Marland Kitchen oxyCODONE-acetaminophen (PERCOCET) 10-325 MG per tablet Take 1 tablet by mouth every 6 (six) hours as needed for pain. 40 tablet 0  . oxyCODONE-acetaminophen (PERCOCET/ROXICET) 5-325 MG per tablet Take 1-2 tablets by mouth every 6 (six) hours as needed for severe pain. (Patient not taking: Reported on 08/04/2014) 60 tablet 0  . polyethylene glycol powder (GLYCOLAX/MIRALAX) powder 1 cap full in a full glass of water, once a day for 5 days. (Patient not taking: Reported on 08/04/2014) 255 g 0  . potassium chloride SA (K-DUR,KLOR-CON) 10 MEQ tablet Take 1 tablet (10 mEq total) by mouth 2 (two) times daily. for 3 days then 1 pill a day (Patient taking differently: Take 10 mEq by mouth 2 (two) times daily. ) 20 tablet 0  . predniSONE (DELTASONE) 20 MG tablet Take 2 tablets (40 mg total) by mouth daily. 10 tablet 0  . psyllium (METAMUCIL) 58.6 % powder Take 1 packet by mouth  2 (two) times daily as needed (for constipation).    . ranitidine (ZANTAC) 150 MG tablet Take 150 mg by mouth 2 (two) times daily.    Marland Kitchen senna (SENOKOT) 8.6 MG tablet Take 2 tablets by mouth 2 (two) times  daily as needed for constipation.    . simvastatin (ZOCOR) 40 MG tablet Take 40 mg by mouth at bedtime.     Marland Kitchen tiotropium (SPIRIVA) 18 MCG inhalation capsule Place 18 mcg into inhaler and inhale daily.    Marland Kitchen zolpidem (AMBIEN) 5 MG tablet Take 5 mg by mouth at bedtime as needed for sleep.     Facility-Administered Medications Ordered in Other Encounters  Medication Dose Route Frequency Provider Last Rate Last Dose  . heparin lock flush 100 unit/mL  500 Units Intravenous Once Lequita Asal, MD      . sodium chloride 0.9 % injection 10 mL  10 mL Intravenous PRN Lequita Asal, MD        Musculoskeletal: Strength & Muscle Tone: within normal limits Gait & Station: normal Patient leans: N/A  Psychiatric Specialty Exam: Physical Exam  Nursing note and vitals reviewed.   ROS  Blood pressure 102/59, pulse 92, temperature 98.1 F (36.7 C), temperature source Oral, resp. rate 18, height 5' 8.5" (1.74 m), weight 77.111 kg (170 lb), SpO2 98 %.Body mass index is 25.47 kg/(m^2).  General Appearance: Casual  Eye Contact::  Fair  Speech:  Clear and Coherent  Volume:  Normal  Mood:  Anxious and fair  Affect:  Constricted  Thought Process:  Goal Directed  Orientation:  Full (Time, Place, and Person)  Thought Content:  WDL  Suicidal Thoughts:  No Not now and contracts for safety  Homicidal Thoughts:  No  Memory:  Immediate;   Fair Recent;   Fair Remote;   Fair adequate  Judgement:  Fair  Insight:  Fair  Psychomotor Activity:  Normal  Concentration:  Fair  Recall:  [employer] [place]  Language: Fair  Akathisia:  No  Handed:  Right  AIMS (if indicated):     Assets:  Communication Skills Desire for Improvement Housing Social Support  ADL's:  Intact  Cognition: WNL  Sleep:      Medical Decision Making: Established Problem, Stable/Improving (1)  Treatment Plan Summary: Start pt back on her meds and D/C in AM of 08/07/2014 and pt has appt coming up at 10.00 am for  MamMOGRAM  Plan:  No evidence of imminent risk to self or others at present.   Disposition: as above.  Dewain Penning 08/06/2014 11:58 AM

## 2014-08-07 ENCOUNTER — Ambulatory Visit
Admit: 2014-08-07 | Discharge: 2014-08-07 | Disposition: A | Discharge status: Home / Self Care | Payer: Commercial Managed Care - HMO | Attending: Radiation Oncology | Admitting: Radiation Oncology

## 2014-08-07 ENCOUNTER — Ambulatory Visit: Payer: Commercial Managed Care - HMO

## 2014-08-07 ENCOUNTER — Ambulatory Visit: Payer: Commercial Managed Care - HMO | Admitting: Oncology

## 2014-08-07 ENCOUNTER — Inpatient Hospital Stay: Payer: Commercial Managed Care - HMO

## 2014-08-07 ENCOUNTER — Ambulatory Visit: Payer: Commercial Managed Care - HMO | Attending: Physician Assistant

## 2014-08-07 ENCOUNTER — Inpatient Hospital Stay: Payer: Commercial Managed Care - HMO | Attending: Hematology and Oncology

## 2014-08-07 ENCOUNTER — Inpatient Hospital Stay: Payer: Commercial Managed Care - HMO | Admitting: Oncology

## 2014-08-07 DIAGNOSIS — F419 Anxiety disorder, unspecified: Secondary | ICD-10-CM | POA: Insufficient documentation

## 2014-08-07 DIAGNOSIS — G4730 Sleep apnea, unspecified: Secondary | ICD-10-CM | POA: Insufficient documentation

## 2014-08-07 DIAGNOSIS — I10 Essential (primary) hypertension: Secondary | ICD-10-CM | POA: Insufficient documentation

## 2014-08-07 DIAGNOSIS — Z79899 Other long term (current) drug therapy: Secondary | ICD-10-CM | POA: Insufficient documentation

## 2014-08-07 DIAGNOSIS — E876 Hypokalemia: Secondary | ICD-10-CM | POA: Insufficient documentation

## 2014-08-07 DIAGNOSIS — C210 Malignant neoplasm of anus, unspecified: Secondary | ICD-10-CM | POA: Insufficient documentation

## 2014-08-07 DIAGNOSIS — J449 Chronic obstructive pulmonary disease, unspecified: Secondary | ICD-10-CM | POA: Insufficient documentation

## 2014-08-07 DIAGNOSIS — Z8701 Personal history of pneumonia (recurrent): Secondary | ICD-10-CM | POA: Insufficient documentation

## 2014-08-07 DIAGNOSIS — F4310 Post-traumatic stress disorder, unspecified: Secondary | ICD-10-CM | POA: Insufficient documentation

## 2014-08-07 DIAGNOSIS — E039 Hypothyroidism, unspecified: Secondary | ICD-10-CM | POA: Insufficient documentation

## 2014-08-07 DIAGNOSIS — Z86718 Personal history of other venous thrombosis and embolism: Secondary | ICD-10-CM | POA: Insufficient documentation

## 2014-08-07 DIAGNOSIS — F209 Schizophrenia, unspecified: Secondary | ICD-10-CM | POA: Insufficient documentation

## 2014-08-07 DIAGNOSIS — M797 Fibromyalgia: Secondary | ICD-10-CM | POA: Insufficient documentation

## 2014-08-07 DIAGNOSIS — F329 Major depressive disorder, single episode, unspecified: Secondary | ICD-10-CM | POA: Diagnosis not present

## 2014-08-07 DIAGNOSIS — D696 Thrombocytopenia, unspecified: Secondary | ICD-10-CM | POA: Insufficient documentation

## 2014-08-07 DIAGNOSIS — F32A Depression, unspecified: Secondary | ICD-10-CM | POA: Insufficient documentation

## 2014-08-07 DIAGNOSIS — D72819 Decreased white blood cell count, unspecified: Secondary | ICD-10-CM | POA: Insufficient documentation

## 2014-08-07 DIAGNOSIS — M5136 Other intervertebral disc degeneration, lumbar region: Secondary | ICD-10-CM | POA: Insufficient documentation

## 2014-08-07 DIAGNOSIS — F319 Bipolar disorder, unspecified: Secondary | ICD-10-CM | POA: Insufficient documentation

## 2014-08-07 DIAGNOSIS — E041 Nontoxic single thyroid nodule: Secondary | ICD-10-CM | POA: Insufficient documentation

## 2014-08-07 DIAGNOSIS — R0602 Shortness of breath: Secondary | ICD-10-CM | POA: Insufficient documentation

## 2014-08-07 DIAGNOSIS — G629 Polyneuropathy, unspecified: Secondary | ICD-10-CM | POA: Insufficient documentation

## 2014-08-07 DIAGNOSIS — J45909 Unspecified asthma, uncomplicated: Secondary | ICD-10-CM | POA: Insufficient documentation

## 2014-08-07 DIAGNOSIS — M4800 Spinal stenosis, site unspecified: Secondary | ICD-10-CM | POA: Insufficient documentation

## 2014-08-07 DIAGNOSIS — K219 Gastro-esophageal reflux disease without esophagitis: Secondary | ICD-10-CM | POA: Insufficient documentation

## 2014-08-07 DIAGNOSIS — F17210 Nicotine dependence, cigarettes, uncomplicated: Secondary | ICD-10-CM | POA: Insufficient documentation

## 2014-08-07 MED ORDER — ALBUTEROL SULFATE HFA 108 (90 BASE) MCG/ACT IN AERS: 1.0000 | INHALATION_SPRAY | Freq: Four times a day (QID) | RESPIRATORY_TRACT | Status: DC | PRN

## 2014-08-07 MED ORDER — ALBUTEROL SULFATE (2.5 MG/3ML) 0.083% IN NEBU
2.5000 mg | INHALATION_SOLUTION | Freq: Four times a day (QID) | RESPIRATORY_TRACT | Status: DC | PRN
  Filled 2014-08-07: qty 3

## 2014-08-07 MED ORDER — CLONAZEPAM 0.5 MG PO TABS
0.5000 mg | ORAL_TABLET | Freq: Two times a day (BID) | ORAL | Status: DC
  Administered 2014-08-07: 0.5 mg via ORAL
  Filled 2014-08-07: qty 1

## 2014-08-07 MED ORDER — ALBUTEROL SULFATE HFA 108 (90 BASE) MCG/ACT IN AERS
2.0000 | INHALATION_SPRAY | Freq: Four times a day (QID) | RESPIRATORY_TRACT | Status: DC | PRN
  Filled 2014-08-07: qty 6.7

## 2014-08-07 NOTE — ED Notes (Signed)
Pt discharged home after verbalizing understanding of discharge instructions; nad noted. 

## 2014-08-07 NOTE — ED Notes (Signed)
BEHAVIORAL HEALTH ROUNDING Patient sleeping: Yes.   Patient alert and oriented: not applicable SLEEPING Behavior appropriate: Yes.  ; If no, describe: SLEEPING Nutrition and fluids offered: No SLEEPING Toileting and hygiene offered: NoSLEEPING Sitter present: not applicable Law enforcement present: Yes ODS

## 2014-08-07 NOTE — ED Notes (Signed)
Verified mammogram appt at 1240

## 2014-08-07 NOTE — ED Notes (Signed)

## 2014-08-07 NOTE — ED Notes (Signed)
BEHAVIORAL HEALTH ROUNDING Patient sleeping: Yes.   Patient alert and oriented: not applicable Behavior appropriate: Yes.  ; If no, describe:  Nutrition and fluids offered: No Toileting and hygiene offered: No Sitter present: no Law enforcement present: Yes

## 2014-08-07 NOTE — ED Notes (Signed)
BEHAVIORAL HEALTH ROUNDING Patient sleeping: No. Patient alert and oriented: yes Behavior appropriate: Yes.  ; If no, describe:  Nutrition and fluids offered: Yes  Toileting and hygiene offered: Yes  Sitter present: no Law enforcement present: Yes  

## 2014-08-07 NOTE — ED Notes (Signed)
BEHAVIORAL HEALTH ROUNDING Patient sleeping: No. Patient alert and oriented: yes Behavior appropriate: Yes.  ; If no, describe:  Nutrition and fluids offered: Yes  Toileting and hygiene offered: Yes  Sitter present: no Law enforcement present: Yes   Pt eating dinner; will discharge when she finishes

## 2014-08-07 NOTE — ED Notes (Signed)
Pt given breakfast tray

## 2014-08-07 NOTE — ED Provider Notes (Addendum)
-----------------------------------------   3:59 PM on 08/07/2014 -----------------------------------------   Blood pressure 130/60, pulse 80, temperature 98.6 F (37 C), temperature source Oral, resp. rate 14, height 5' 8.5" (1.74 m), weight 170 lb (77.111 kg), SpO2 98 %.  The patient had no acute events since last update.  Calm and cooperative at this time.  Patient evaluated by Dr. Weber Cooks who is rescinding the patient's involuntary commitment. The patient at this time is without any suicidal or homicidal ideation or acute psychosis or intoxication will be discharged home. To follow-up with her outpatient psychiatrist.    Orbie Pyo, MD 08/07/14 1559  Correction to above. There was no involuntary commitment completed for this patient.   Orbie Pyo, MD 08/07/14 [phone]

## 2014-08-07 NOTE — ED Notes (Signed)
Spoke with dr Edd Fabian regarding pt discharge. She is working with psych to get pt discharged before mammogram appt if she is to be discharged.

## 2014-08-07 NOTE — ED Notes (Signed)
rcvd call from cancer center confirming appt for later today

## 2014-08-07 NOTE — ED Notes (Signed)
ED BHU [place] Is the patient under IVC or is there intent for IVC: No. Is the patient medically cleared: Yes.   Is there vacancy in the ED BHU: Yes.   Is the population mix appropriate for patient: Yes.   Is the patient awaiting placement in inpatient or outpatient setting: No. Has the patient had a psychiatric consult: Yes.   Survey of unit performed for contraband, proper placement and condition of furniture, tampering with fixtures in bathroom, shower, and each patient room: Yes.  ; Findings:  APPEARANCE/BEHAVIOR calm, cooperative and adequate rapport can be established NEURO ASSESSMENT Orientation: time, place and person Hallucinations: No.None noted (Hallucinations) Speech: Normal Gait: normal RESPIRATORY ASSESSMENT Normal expansion.  Clear to auscultation.  No rales, rhonchi, or wheezing. CARDIOVASCULAR ASSESSMENT regular rate and rhythm, S1, S2 normal, no murmur, click, rub or gallop GASTROINTESTINAL ASSESSMENT ostomy intact, pink EXTREMITIES normal strength, tone, and muscle mass PLAN OF CARE Provide calm/safe environment. Vital signs assessed twice daily. ED BHU Assessment once each 12-hour shift. Collaborate with intake RN daily or as condition indicates. Assure the ED provider has rounded once each shift. Provide and encourage hygiene. Provide redirection as needed. Assess for escalating behavior; address immediately and inform ED provider.  Assess family dynamic and appropriateness for visitation as needed: Yes.  ; If necessary, describe findings:  Educate the patient/family about BHU procedures/visitation: Yes.  ; If necessary, describe findings:

## 2014-08-07 NOTE — ED Provider Notes (Signed)
-----------------------------------------   8:47 AM on 08/07/2014 -----------------------------------------   Blood pressure 130/60, pulse 80, temperature 98.6 F (37 C), temperature source Oral, resp. rate 14, height 5' 8.5" (1.74 m), weight 170 lb (77.111 kg), SpO2 98 %.  The patient had no acute events since last update.  Calm and cooperative at this time.  Disposition is pending per Psychiatry/Behavioral Medicine team recommendations.     Joanne Gavel, MD 08/07/14 [phone]

## 2014-08-07 NOTE — ED Notes (Signed)
rcvd call from cancer center requesting to know whether pt will make her appointment this afternoon. Told nurse that we are apparently not going to have her released in time. She advised [name] to tell pt to call in the AM to let them know when she will be coming back in.

## 2014-08-07 NOTE — Consult Note (Signed)
[name] Consult   Reason for Consult:  Followup Referring Physician:  ER Patient Identification: [name] MRN:  [id number] Principal Diagnosis: <principal problem not specified> Diagnosis:   Patient Active Problem List   Diagnosis Date Noted  . Hypokalemia [E87.6] 07/20/2014  . Anal cancer [C21.0] 06/27/2014  . Anal fissure [K60.2] 06/27/2014  . Anxiety [F41.9] 06/27/2014  . Airway hyperreactivity [J45.909] 06/27/2014  . H/O manic depressive disorder [Z86.59] 06/27/2014  . CAFL (chronic airflow limitation) [J44.9] 06/27/2014  . Clinical depression [F32.9] 06/27/2014  . Deep vein thrombosis [I82.409] 06/27/2014  . External hemorrhoid [K64.8] 06/27/2014  . Myalgia and myositis [M79.1, M60.9] 06/27/2014  . Acid reflux [K21.9] 06/27/2014  . Hemorrhoid [K64.9] 06/27/2014  . BP (high blood pressure) [I10] 06/27/2014  . HLD (hyperlipidemia) [E78.5] 06/27/2014  . Adult hypothyroidism [E03.9] 06/27/2014  . LBP (low back pain) [M54.5] 06/27/2014  . Mass of perianal area [R19.8] 06/27/2014  . External hemorrhoid, thrombosed [K64.5] 06/27/2014  . 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 Ulcerated hemorrhoid [K64.8] 06/27/2014  . Severe obstructive sleep apnea [G47.33] 06/27/2014  . 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 Rectal ulcer [K62.6] 05/17/2014    Total Time spent with patient: 1 hour  Subjective:   [name] is a 59 y.o. female patient admitted for depression and having suicidal ideations. Patient was seen for follow-up in the [place]. She reported that she currently lives in a boarding home and shares a room with her granddaughter who is 84 years old. She reported that her other daughter lives across from her room. Patient stated that she was following with Dr. Collie Siad for more than 2 years until her insurance changes and she is not allowed to follow up with him as they do not accept [employer]. Patient reported that she was taking Klonopin, Cymbalta and  Lexapro and has been out of her medications for approximately 2 months. She reported that she has completed her chemotherapy at the cancer center on Friday and has been becoming more depressed and anxious and wants her psycho topic medication.  She stated that she does not have any thoughts to hurt herself at this time and is willing to take the prescription of the medications and was focused on getting a prescription of Klonopin. Patient stated that her granddaughter or her friend will fill the prescription of the Klonopin for her. She stated that the Cymbalta and Lexapro only help with her depressive symptoms and she is not interesting in titrating the dose of the medications. She denied having any perceptual disturbances. She denied having any use of drugs or alcohol at this time. Patient currently has the ileostomy bag due to her history of rectal cancer.  Past Medical History:  Past Medical History  Diagnosis Date  . Schizophrenia   . Asthma   . GERD (gastroesophageal reflux disease)   . Anxiety   . Depression   . Bipolar disorder   . COPD (chronic obstructive pulmonary disease)   . Occasional tremors     right hand  . PTSD (post-traumatic stress disorder)   . Shortness of breath dyspnea   . Fibromyalgia   . DVT (deep venous thrombosis) 2011    RUE  . Thyroid nodule   . DDD (degenerative disc disease), lumbar   . Spinal stenosis   . Peripheral neuropathy   . Rotator cuff tear     right  . Pneumonia 2011  . Hypothyroidism     no meds currently  . Anemia  during pregnancy only  . Hypertension     Off meds x 15 years-well controlled now per pt  . Squamous cell cancer, anus   . Severe obstructive sleep apnea 06/27/2014    Past Surgical History  Procedure Laterality Date  . Foot surgery Right   . Tubal ligation    . Eye surgery Bilateral   . Mouth surgery  2002  . Rectal biopsy N/A 05/08/2014    Procedure: BIOPSY RECTAL;  Surgeon: Marlyce Huge, MD;  Location: [facility]  ORS;  Service: General;  Laterality: N/A;  . Evaluation under anesthesia with hemorrhoidectomy N/A 05/08/2014    Procedure: EXAM UNDER ANESTHESIA WITH HEMORRHOIDECTOMY;  Surgeon: Marlyce Huge, MD;  Location: [facility] ORS;  Service: General;  Laterality: N/A;  . Portacath placement N/A 05/20/2014    Procedure: INSERTION PORT-A-CATH;  Surgeon: Florene Glen, MD;  Location: [facility] ORS;  Service: General;  Laterality: N/A;  . Laparoscopic diverted colostomy N/A 05/26/2014    Procedure: LAPAROSCOPIC DIVERTED COLOSTOMY;  Surgeon: Marlyce Huge, MD;  Location: [facility] ORS;  Service: General;  Laterality: N/A;   Family History:  Family History  Problem Relation Age of Onset  . Cancer Maternal Aunt   . Cancer Paternal Uncle    Social History:  History  Alcohol Use  . Yes    Comment: 1 drink every 2-3 times/year     History  Drug Use No    Comment: Pt denies but has + UDS for marijuana on 12-2013    History   Social History  . Marital Status: Legally Separated    Spouse Name: N/A  . Number of Children: N/A  . Years of Education: N/A   Social History Main Topics  . Smoking status: Current Every Day Smoker -- 0.25 packs/day for 44 years    Types: Cigarettes  . Smokeless tobacco: Never Used  . Alcohol Use: Yes     Comment: 1 drink every 2-3 times/year  . Drug Use: No     Comment: Pt denies but has + UDS for marijuana on 12-2013  . Sexual Activity: Not on file   Other Topics Concern  . None   Social History Narrative   Additional Social History: Patient reported that she currently lives with her family members. She denied having any thoughts to hurt herself at this time.    History of alcohol / drug use?: No history of alcohol / drug abuse                     Allergies:   Allergies  Allergen Reactions  . Sulfa Antibiotics Hives    Labs:  Results for orders placed or performed during the hospital encounter of 08/05/14 (from the past 48 hour(s))   Comprehensive metabolic panel     Status: Abnormal   Collection Time: 08/05/14  6:20 PM  Result Value Ref Range   Sodium 140 135 - 145 mmol/L   Potassium 2.9 (LL) 3.5 - 5.1 mmol/L    Comment: CRITICAL RESULT CALLED TO, READ BACK BY AND VERIFIED WITH [facility] SANDERS 08/05/14 1845 BOD    Chloride 107 101 - 111 mmol/L   CO2 25 22 - 32 mmol/L   Glucose, Bld 119 (H) 65 - 99 mg/dL   BUN 12 6 - 20 mg/dL   Creatinine, Ser 0.64 0.44 - 1.00 mg/dL   Calcium 8.8 (L) 8.9 - 10.3 mg/dL   Total Protein 6.9 6.5 - 8.1 g/dL   Albumin 2.9 (L) 3.5 - 5.0 g/dL  AST 25 15 - 41 U/L   ALT 11 (L) 14 - 54 U/L   Alkaline Phosphatase 49 38 - 126 U/L   Total Bilirubin 0.5 0.3 - 1.2 mg/dL   GFR calc non Af Amer >60 >60 mL/min   GFR calc Af Amer >60 >60 mL/min    Comment: (NOTE) The eGFR has been calculated using the CKD EPI equation. This calculation has not been validated in all clinical situations. eGFR's persistently <60 mL/min signify possible Chronic Kidney Disease.    Anion gap 8 5 - 15  Ethanol (ETOH)     Status: None   Collection Time: 08/05/14  6:20 PM  Result Value Ref Range   Alcohol, Ethyl (B) <5 <5 mg/dL    Comment:        LOWEST DETECTABLE LIMIT FOR SERUM ALCOHOL IS 5 mg/dL FOR MEDICAL PURPOSES ONLY   Salicylate level     Status: None   Collection Time: 08/05/14  6:20 PM  Result Value Ref Range   Salicylate Lvl <5.0 2.8 - 30.0 mg/dL  Acetaminophen level     Status: Abnormal   Collection Time: 08/05/14  6:20 PM  Result Value Ref Range   Acetaminophen (Tylenol), Serum <10 (L) 10 - 30 ug/mL    Comment:        THERAPEUTIC CONCENTRATIONS VARY SIGNIFICANTLY. A RANGE OF 10-30 ug/mL MAY BE AN EFFECTIVE CONCENTRATION FOR MANY PATIENTS. HOWEVER, SOME ARE BEST TREATED AT CONCENTRATIONS OUTSIDE THIS RANGE. ACETAMINOPHEN CONCENTRATIONS >150 ug/mL AT 4 HOURS AFTER INGESTION AND >50 ug/mL AT 12 HOURS AFTER INGESTION ARE OFTEN ASSOCIATED WITH TOXIC REACTIONS.   CBC     Status: Abnormal    Collection Time: 08/05/14  6:20 PM  Result Value Ref Range   WBC 5.1 3.6 - 11.0 K/uL   RBC 3.72 (L) 3.80 - 5.20 MIL/uL   Hemoglobin 11.8 (L) 12.0 - 16.0 g/dL   HCT 35.1 35.0 - 47.0 %   MCV 94.4 80.0 - 100.0 fL   MCH 31.8 26.0 - 34.0 pg   MCHC 33.7 32.0 - 36.0 g/dL   RDW 18.9 (H) 11.5 - 14.5 %   Platelets 171 150 - 440 K/uL  Urine Drug Screen, Qualitative ([facility] only)     Status: None   Collection Time: 08/05/14  6:20 PM  Result Value Ref Range   Tricyclic, Ur Screen NONE DETECTED NONE DETECTED   Amphetamines, Ur Screen NONE DETECTED NONE DETECTED   MDMA (Ecstasy)Ur Screen NONE DETECTED NONE DETECTED   Cocaine Metabolite,Ur [place] NONE DETECTED NONE DETECTED   Opiate, Ur Screen NONE DETECTED NONE DETECTED   Phencyclidine (PCP) Ur S NONE DETECTED NONE DETECTED   Cannabinoid 50 Ng, Ur [place] NONE DETECTED NONE DETECTED   Barbiturates, Ur Screen NONE DETECTED NONE DETECTED   Benzodiazepine, Ur Scrn NONE DETECTED NONE DETECTED   Methadone Scn, Ur NONE DETECTED NONE DETECTED    Comment: (NOTE) 539  Tricyclics, urine               Cutoff 1000 ng/mL 200  Amphetamines, urine             Cutoff 1000 ng/mL 300  MDMA (Ecstasy), urine           Cutoff 500 ng/mL 400  Cocaine Metabolite, urine       Cutoff 300 ng/mL 500  Opiate, urine                   Cutoff 300 ng/mL 600  Phencyclidine (PCP), urine  Cutoff 25 ng/mL 700  Cannabinoid, urine              Cutoff 50 ng/mL 800  Barbiturates, urine             Cutoff 200 ng/mL 900  Benzodiazepine, urine           Cutoff 200 ng/mL 1000 Methadone, urine                Cutoff 300 ng/mL 1100 1200 The urine drug screen provides only a preliminary, unconfirmed 1300 analytical test result and should not be used for non-medical 1400 purposes. Clinical consideration and professional judgment should 1500 be applied to any positive drug screen result due to possible 1600 interfering substances. A more specific alternate chemical method 1700 must be used in  order to obtain a confirmed analytical result.  1800 Gas chromato graphy / mass spectrometry (GC/MS) is the preferred 1900 confirmatory method.   Basic metabolic panel     Status: Abnormal   Collection Time: 08/06/14 10:01 AM  Result Value Ref Range   Sodium 143 135 - 145 mmol/L   Potassium 4.1 3.5 - 5.1 mmol/L   Chloride 109 101 - 111 mmol/L   CO2 28 22 - 32 mmol/L   Glucose, Bld 113 (H) 65 - 99 mg/dL   BUN 12 6 - 20 mg/dL   Creatinine, Ser 0.57 0.44 - 1.00 mg/dL   Calcium 8.9 8.9 - 10.3 mg/dL   GFR calc non Af Amer >60 >60 mL/min   GFR calc Af Amer >60 >60 mL/min    Comment: (NOTE) The eGFR has been calculated using the CKD EPI equation. This calculation has not been validated in all clinical situations. eGFR's persistently <60 mL/min signify possible Chronic Kidney Disease.    Anion gap 6 5 - 15    Vitals: Blood pressure 130/60, pulse 80, temperature 98.6 F (37 C), temperature source Oral, resp. rate 14, height 5' 8.5" (1.74 m), weight 170 lb (77.111 kg), SpO2 98 %.  Risk to Self: Suicidal Ideation: Yes-Currently Present Suicidal Intent: Yes-Currently Present Is patient at risk for suicide?: Yes Suicidal Plan?: Yes-Currently Present Specify Current Suicidal Plan: Overdose on medication  Access to Means: Yes Specify Access to Suicidal Means: Pills at home What has been your use of drugs/alcohol within the last 12 months?: None Reported How many times?: 0 Other Self Harm Risks: None Reported Triggers for Past Attempts:  (N/A) Intentional Self Injurious Behavior: None Risk to Others: Homicidal Ideation: No Thoughts of Harm to Others: No Current Homicidal Intent: No Current Homicidal Plan: No Access to Homicidal Means: No Identified Victim: None Reported History of harm to others?: No Assessment of Violence: None Noted Violent Behavior Description: None Reported Does patient have access to weapons?: No Criminal Charges Pending?: No Does patient have a court date:  No Prior Inpatient Therapy: Prior Inpatient Therapy: No Prior Therapy Dates: NA Prior Therapy Facilty/Provider(s): NA Reason for Treatment: NA Prior Outpatient Therapy: Prior Outpatient Therapy: Yes Prior Therapy Dates: Ongoing  Prior Therapy Facilty/Provider(s): [facility]  Reason for Treatment: Medication Management Does patient have an ACCT team?: No Does patient have Intensive In-House Services?  : No Does patient have [facility] services? : No Does patient have P4CC services?: No  Current Facility-Administered Medications  Medication Dose Route Frequency Provider Last Rate Last Dose  . azithromycin (ZITHROMAX) tablet 250 mg  250 mg Oral Daily Carrie Mew, MD   250 mg at 08/06/14 1013  . clonazePAM (KLONOPIN) tablet 0.5  mg  0.5 mg Oral BID Rainey Pines, MD      . clonazePAM Bobbye [place]) tablet 1 mg  1 mg Oral TID PRN Carrie Mew, MD      . DULoxetine (CYMBALTA) DR capsule 60 mg  60 mg Oral Daily Dewain Penning, MD   60 mg at 08/06/14 1240  . escitalopram (LEXAPRO) tablet 20 mg  20 mg Oral Daily Dewain Penning, MD   20 mg at 08/06/14 1240  . famotidine (PEPCID) tablet 20 mg  20 mg Oral BID Carrie Mew, MD   20 mg at 08/06/14 2135  . magic mouthwash  5 mL Oral QID Carrie Mew, MD   5 mL at 08/06/14 2135  . oxyCODONE-acetaminophen (PERCOCET/ROXICET) 5-325 MG per tablet 2 tablet  2 tablet Oral Q6H PRN Ahmed Prima, MD   2 tablet at 08/06/14 1845  . predniSONE (DELTASONE) tablet 40 mg  40 mg Oral Daily Carrie Mew, MD   40 mg at 08/06/14 1013  . zolpidem (AMBIEN) tablet 10 mg  10 mg Oral QHS Dewain Penning, MD   10 mg at 08/06/14 2251   Current Outpatient Prescriptions  Medication Sig Dispense Refill  . albuterol (PROVENTIL HFA;VENTOLIN HFA) 108 (90 BASE) MCG/ACT inhaler Inhale 1 puff into the lungs every 6 (six) hours as needed for wheezing or shortness of breath.    Marland Kitchen albuterol (PROVENTIL) (2.5 MG/3ML) 0.083% nebulizer solution Inhale 3 mLs (2.5  mg total) into the lungs every 6 (six) hours as needed for wheezing or shortness of breath. (Patient not taking: Reported on 08/04/2014) 75 mL 12  . Alum & Mag Hydroxide-Simeth (MAGIC MOUTHWASH) SOLN Take 5 mLs by mouth 4 (four) times daily. (Patient not taking: Reported on 08/04/2014) 480 mL 3  . azithromycin (ZITHROMAX Z-PAK) 250 MG tablet Take 2 tablets (500 mg) on  Day 1,  followed by 1 tablet (250 mg) once daily on Days 2 through 5. 6 each 0  . budesonide-formoterol (SYMBICORT) 160-4.5 MCG/ACT inhaler Inhale 2 puffs into the lungs 2 (two) times daily. 1 Inhaler 12  . Calcium Carb-Cholecalciferol (CALCIUM 600+D) 600-800 MG-UNIT TABS Take 1 tablet by mouth 2 (two) times daily.     . clindamycin (CLEOCIN) 300 MG capsule Take 300 mg by mouth 3 (three) times daily.    . clonazePAM (KLONOPIN) 1 MG tablet Take 1 mg by mouth 3 (three) times daily as needed for anxiety.    . docusate sodium (COLACE) 100 MG capsule Take 2 capsules (200 mg total) by mouth 2 (two) times daily. (Patient taking differently: Take 200 mg by mouth 2 (two) times daily as needed for mild constipation. ) 120 capsule 0  . DULoxetine (CYMBALTA) 60 MG capsule Take 60 mg by mouth every evening.    . escitalopram (LEXAPRO) 20 MG tablet Take 20 mg by mouth at bedtime.    Marland Kitchen etodolac (LODINE) 500 MG tablet Take 500 mg by mouth 2 (two) times daily.    Marland Kitchen lidocaine (XYLOCAINE) 2 % jelly Apply 1 application topically as needed (for pain).    . ondansetron (ZOFRAN) 8 MG tablet Take 1 tablet (8 mg total) by mouth 2 (two) times daily. Start the day after chemo for 2 days. Then take as needed for nausea or vomiting. (Patient not taking: Reported on 08/04/2014) 30 tablet 1  . oxybutynin (DITROPAN) 5 MG tablet Take 5 mg by mouth 2 (two) times daily.    Marland Kitchen oxyCODONE-acetaminophen (PERCOCET) 10-325 MG per tablet Take 1 tablet by mouth every  6 (six) hours as needed for pain. 40 tablet 0  . oxyCODONE-acetaminophen (PERCOCET/ROXICET) 5-325 MG per tablet Take  1-2 tablets by mouth every 6 (six) hours as needed for severe pain. (Patient not taking: Reported on 08/04/2014) 60 tablet 0  . polyethylene glycol powder (GLYCOLAX/MIRALAX) powder 1 cap full in a full glass of water, once a day for 5 days. (Patient not taking: Reported on 08/04/2014) 255 g 0  . potassium chloride SA (K-DUR,KLOR-CON) 10 MEQ tablet Take 1 tablet (10 mEq total) by mouth 2 (two) times daily. for 3 days then 1 pill a day (Patient taking differently: Take 10 mEq by mouth 2 (two) times daily. ) 20 tablet 0  . predniSONE (DELTASONE) 20 MG tablet Take 2 tablets (40 mg total) by mouth daily. 10 tablet 0  . psyllium (METAMUCIL) 58.6 % powder Take 1 packet by mouth 2 (two) times daily as needed (for constipation).    . ranitidine (ZANTAC) 150 MG tablet Take 150 mg by mouth 2 (two) times daily.    Marland Kitchen senna (SENOKOT) 8.6 MG tablet Take 2 tablets by mouth 2 (two) times daily as needed for constipation.    . simvastatin (ZOCOR) 40 MG tablet Take 40 mg by mouth at bedtime.     Marland Kitchen tiotropium (SPIRIVA) 18 MCG inhalation capsule Place 18 mcg into inhaler and inhale daily.    Marland Kitchen zolpidem (AMBIEN) 5 MG tablet Take 5 mg by mouth at bedtime as needed for sleep.     Facility-Administered Medications Ordered in Other Encounters  Medication Dose Route Frequency Provider Last Rate Last Dose  . heparin lock flush 100 unit/mL  500 Units Intravenous Once Lequita Asal, MD      . sodium chloride 0.9 % injection 10 mL  10 mL Intravenous PRN Lequita Asal, MD        Musculoskeletal: Strength & Muscle Tone: within normal limits Gait & Station: normal Patient leans: N/A  Psychiatric Specialty Exam: Physical Exam  Nursing note and vitals reviewed.   Review of Systems  Gastrointestinal: Positive for diarrhea and constipation.  Psychiatric/Behavioral: Positive for depression. The patient is nervous/anxious.     Blood pressure 130/60, pulse 80, temperature 98.6 F (37 C), temperature source Oral, resp.  rate 14, height 5' 8.5" (1.74 m), weight 170 lb (77.111 kg), SpO2 98 %.Body mass index is 25.47 kg/(m^2).  General Appearance: Casual  Eye Contact::  Fair  Speech:  Clear and Coherent  Volume:  Normal  Mood:  Anxious and fair  Affect:  Constricted  Thought Process:  Goal Directed  Orientation:  Full (Time, Place, and Person)  Thought Content:  WDL  Suicidal Thoughts:  No Not now and contracts for safety  Homicidal Thoughts:  No  Memory:  Immediate;   Fair Recent;   Fair Remote;   Fair adequate  Judgement:  Fair  Insight:  Fair  Psychomotor Activity:  Normal  Concentration:  Fair  Recall:  [employer] [place]  Language: Fair  Akathisia:  No  Handed:  Right  AIMS (if indicated):     Assets:  Communication Skills Desire for Improvement Housing Social Support  ADL's:  Intact  Cognition: WNL  Sleep:      Medical Decision Making: Established Problem, Stable/Improving (1)  Treatment Plan Summary: Medication management  Plan:  No evidence of imminent risk to self or others at present.   Disposition:  Discussed with patient about the medications and she will be discharged with her family members as she  is not having any thoughts to hurt herself. Patient will be given a prescription of Lexapro 60 mg by mouth daily. She will also continue on Lexapro 20 mg by mouth daily. I will start her back on Klonopin 0.5 mg by mouth twice a day and she will be given 1 month supply of the medications. She will follow-up with Dr. Jimmye Norman on 08/31/2013 at [facility].    More than 50% of the time spent in psychoeducation, counseling and coordination of care.    This note was generated in part or whole with voice recognition software. Voice regonition is usually quite accurate but there are transcription errors that can and very often do occur. I apologize for any typographical errors that were not detected and corrected.   Rainey Pines 08/07/2014 9:54 AM

## 2014-08-08 ENCOUNTER — Ambulatory Visit: Payer: Commercial Managed Care - HMO

## 2014-08-08 ENCOUNTER — Ambulatory Visit: Payer: Commercial Managed Care - HMO | Admitting: Radiation Oncology

## 2014-08-08 ENCOUNTER — Ambulatory Visit
Admission: RE | Admit: 2014-08-08 | Discharge: 2014-08-08 | Disposition: A | Discharge status: Home / Self Care | Payer: Commercial Managed Care - HMO | Source: Ambulatory Visit | Attending: Radiation Oncology | Admitting: Radiation Oncology

## 2014-08-08 ENCOUNTER — Inpatient Hospital Stay: Payer: Commercial Managed Care - HMO

## 2014-08-08 ENCOUNTER — Inpatient Hospital Stay: Payer: Commercial Managed Care - HMO | Admitting: Oncology

## 2014-08-08 DIAGNOSIS — Z510 Encounter for antineoplastic radiation therapy: Secondary | ICD-10-CM | POA: Diagnosis not present

## 2014-08-09 ENCOUNTER — Other Ambulatory Visit: Payer: Self-pay | Admitting: *Deleted

## 2014-08-09 ENCOUNTER — Ambulatory Visit
Admission: RE | Admit: 2014-08-09 | Discharge: 2014-08-09 | Disposition: A | Discharge status: Home / Self Care | Payer: Commercial Managed Care - HMO | Source: Ambulatory Visit | Attending: Radiation Oncology | Admitting: Radiation Oncology

## 2014-08-09 ENCOUNTER — Inpatient Hospital Stay: Payer: Commercial Managed Care - HMO | Admitting: Oncology

## 2014-08-09 ENCOUNTER — Inpatient Hospital Stay: Payer: Commercial Managed Care - HMO

## 2014-08-09 ENCOUNTER — Ambulatory Visit: Admission: RE | Admit: 2014-08-09 | Payer: Commercial Managed Care - HMO | Source: Ambulatory Visit

## 2014-08-09 DIAGNOSIS — C20 Malignant neoplasm of rectum: Secondary | ICD-10-CM

## 2014-08-09 MED ORDER — OXYCODONE-ACETAMINOPHEN 10-325 MG PO TABS: 1.0000 | ORAL_TABLET | Freq: Four times a day (QID) | ORAL | Status: DC | PRN

## 2014-08-09 MED ORDER — SUCRALFATE 1 G PO TABS: 1.0000 g | ORAL_TABLET | Freq: Three times a day (TID) | ORAL | Status: DC

## 2014-08-09 MED ORDER — SILVER SULFADIAZINE 1 % EX CREA: 1.0000 "application " | TOPICAL_CREAM | Freq: Two times a day (BID) | CUTANEOUS | Status: DC

## 2014-08-10 ENCOUNTER — Encounter: Payer: Self-pay | Admitting: Surgery

## 2014-08-10 ENCOUNTER — Ambulatory Visit: Payer: Commercial Managed Care - HMO

## 2014-08-10 ENCOUNTER — Other Ambulatory Visit: Payer: Self-pay | Admitting: *Deleted

## 2014-08-10 NOTE — Patient Outreach (Signed)
Attempt made to contact pt, f/u on recent ED visit.  HIPPA  Compliant voice message left with contact number.  RN CM's next scheduled home visit with pt is  8/15.     [name]   [place] [facility]  [phone]

## 2014-08-10 NOTE — Telephone Encounter (Signed)
PAtients daughter call

## 2014-08-11 ENCOUNTER — Other Ambulatory Visit: Payer: Self-pay | Admitting: *Deleted

## 2014-08-11 ENCOUNTER — Ambulatory Visit: Payer: Commercial Managed Care - HMO

## 2014-08-11 NOTE — Telephone Encounter (Signed)
error 

## 2014-08-11 NOTE — Patient Outreach (Signed)
Second attempt made to contact pt, f/u on recent ED visit.  HIPPA compliant voice message left with contact number.      [name]   [place] [facility]  [phone]

## 2014-08-14 ENCOUNTER — Ambulatory Visit
Admission: RE | Admit: 2014-08-14 | Discharge: 2014-08-14 | Disposition: A | Discharge status: Home / Self Care | Payer: Commercial Managed Care - HMO | Source: Ambulatory Visit | Attending: Radiation Oncology | Admitting: Radiation Oncology

## 2014-08-14 ENCOUNTER — Other Ambulatory Visit: Payer: Self-pay | Admitting: *Deleted

## 2014-08-14 ENCOUNTER — Ambulatory Visit: Payer: Commercial Managed Care - HMO

## 2014-08-14 NOTE — Patient Outreach (Addendum)
[facility]) Care Management  08/14/2014  [name] 04-20-1955 [id number]   Follow up phone call to patient following behavioral health admission and receipt of hospital bed.  HIPPA compliant voicemail message left for a return call.    [name] [facility] [phone]

## 2014-08-15 ENCOUNTER — Ambulatory Visit: Payer: Commercial Managed Care - HMO

## 2014-08-15 ENCOUNTER — Telehealth: Payer: Self-pay

## 2014-08-15 ENCOUNTER — Ambulatory Visit
Admission: RE | Admit: 2014-08-15 | Discharge: 2014-08-15 | Disposition: A | Discharge status: Home / Self Care | Payer: Commercial Managed Care - HMO | Source: Ambulatory Visit | Attending: Radiation Oncology | Admitting: Radiation Oncology

## 2014-08-15 ENCOUNTER — Inpatient Hospital Stay: Payer: Commercial Managed Care - HMO

## 2014-08-15 ENCOUNTER — Inpatient Hospital Stay: Payer: Commercial Managed Care - HMO | Admitting: Hematology and Oncology

## 2014-08-15 NOTE — Telephone Encounter (Signed)
Per radiation RN pt cancelled when Lucianne Lei arrived at house.  Pt was frustrated and could not get dressed.  Notified scheduling to please call pt and schedule her an lab/md later this week.  Scheduling called and pt did not answer call.  Pt is scheduled to have radiation and Lattie Haw will add lab/MD appt.

## 2014-08-16 ENCOUNTER — Ambulatory Visit: Payer: Commercial Managed Care - HMO

## 2014-08-16 ENCOUNTER — Ambulatory Visit
Admission: RE | Admit: 2014-08-16 | Discharge: 2014-08-16 | Disposition: A | Discharge status: Home / Self Care | Payer: Commercial Managed Care - HMO | Source: Ambulatory Visit | Attending: Radiation Oncology | Admitting: Radiation Oncology

## 2014-08-16 ENCOUNTER — Ambulatory Visit: Payer: Commercial Managed Care - HMO | Admitting: Surgery

## 2014-08-17 ENCOUNTER — Encounter: Payer: Commercial Managed Care - HMO | Attending: Surgery | Admitting: Surgery

## 2014-08-17 ENCOUNTER — Ambulatory Visit
Admission: RE | Admit: 2014-08-17 | Discharge: 2014-08-17 | Disposition: A | Discharge status: Home / Self Care | Payer: Commercial Managed Care - HMO | Source: Ambulatory Visit | Attending: Radiation Oncology | Admitting: Radiation Oncology

## 2014-08-17 DIAGNOSIS — I10 Essential (primary) hypertension: Secondary | ICD-10-CM | POA: Insufficient documentation

## 2014-08-17 DIAGNOSIS — F17218 Nicotine dependence, cigarettes, with other nicotine-induced disorders: Secondary | ICD-10-CM | POA: Diagnosis not present

## 2014-08-17 DIAGNOSIS — J449 Chronic obstructive pulmonary disease, unspecified: Secondary | ICD-10-CM | POA: Diagnosis not present

## 2014-08-17 DIAGNOSIS — Z510 Encounter for antineoplastic radiation therapy: Secondary | ICD-10-CM | POA: Diagnosis not present

## 2014-08-17 DIAGNOSIS — C44520 Squamous cell carcinoma of anal skin: Secondary | ICD-10-CM | POA: Diagnosis not present

## 2014-08-17 DIAGNOSIS — L98412 Non-pressure chronic ulcer of buttock with fat layer exposed: Secondary | ICD-10-CM | POA: Diagnosis present

## 2014-08-17 DIAGNOSIS — F329 Major depressive disorder, single episode, unspecified: Secondary | ICD-10-CM | POA: Insufficient documentation

## 2014-08-17 DIAGNOSIS — K627 Radiation proctitis: Secondary | ICD-10-CM | POA: Insufficient documentation

## 2014-08-17 NOTE — Progress Notes (Signed)
[name], [name] ([id number]) Visit Report for 08/17/2014 Abuse/Suicide Risk Screen Details Patient Name: [name], [name]. Date of Service: 08/17/2014 10:15 AM Medical Record Number: [id number] Patient Account Number: [id number] Date of Birth/Sex: 03-07-1955 (59 y.o. Female) Treating RN: Afful, RN, BSN, Velva Harman Primary Care Physician: Clayborn Bigness Other Clinician: Referring Physician: Clayborn Bigness Treating Physician/Extender: Frann Rider in Treatment: 0 Abuse/Suicide Risk Screen Items Answer ABUSE/SUICIDE RISK SCREEN: Has anyone close to you tried to hurt or harm you recentlyo No Do you feel uncomfortable with anyone in your familyo No Has anyone forced you do things that you didnot want to doo No Do you have any thoughts of harming yourselfo No Patient displays signs or symptoms of abuse and/or neglect. No Electronic Signature(s) Signed: 08/17/2014 10:51:42 AM By: Regan Lemming BSN, RN Entered By: Regan Lemming on 08/17/2014 10:51:42 [name] ([id number]) -------------------------------------------------------------------------------- Activities of Daily Living Details Patient Name: [name], [name] Date of Service: 08/17/2014 10:15 AM Medical Record Number: [id number] Patient Account Number: [id number] Date of Birth/Sex: [birth year] (59 y.o. Female) Treating RN: Baruch Gouty, RN, BSN, Velva Harman Primary Care Physician: Clayborn Bigness Other Clinician: Referring Physician: Clayborn Bigness Treating Physician/Extender: Frann Rider in Treatment: 0 Activities of Daily Living Items Answer Activities of Daily Living (Please select one for each item) Drive Automobile Need Assistance Take Medications Completely Able Use Telephone Completely [place] for Appearance Need Assistance Use Toilet Need Assistance Bath / Shower Need Assistance Dress Self Need Assistance Feed Self Need Assistance Walk Completely Able Get In / Out Bed Need Assistance Housework Need Assistance Prepare  Meals Need Assistance Handle Money Need Assistance Shop for Self Need Assistance Electronic Signature(s) Signed: 08/17/2014 10:52:59 AM By: Regan Lemming BSN, RN Entered By: Regan Lemming on 08/17/2014 10:52:58 [name] ([id number]) -------------------------------------------------------------------------------- Education Assessment Details Patient Name: [name]. Date of Service: 08/17/2014 10:15 AM Medical Record Number: [id number] Patient Account Number: [id number] Date of Birth/Sex: [birth year] (59 y.o. Female) Treating RN: Baruch Gouty, RN, BSN, Velva Harman Primary Care Physician: Clayborn Bigness Other Clinician: Referring Physician: Clayborn Bigness Treating Physician/Extender: Frann Rider in Treatment: 0 Primary Learner Assessed: Patient Learning Preferences/Education Level/Primary Language Learning Preference: Explanation Highest Education Level: High School Preferred Language: English Cognitive Barrier Assessment/Beliefs Language Barrier: No Physical Barrier Assessment Impaired Vision: Yes Glasses Impaired Hearing: No Decreased Hand dexterity: No Knowledge/Comprehension Assessment Knowledge Level: Medium Comprehension Level: Medium Ability to understand written Medium instructions: Ability to understand verbal Medium instructions: Motivation Assessment Anxiety Level: Calm Cooperation: Cooperative Education Importance: Acknowledges Need Interest in Health Problems: Asks Questions Perception: Coherent Willingness to Engage in Self- Medium Management Activities: Readiness to Engage in Self- Medium Management Activities: Electronic Signature(s) Signed: 08/17/2014 10:53:27 AM By: Regan Lemming BSN, RN Entered By: Regan Lemming on 08/17/2014 10:53:27 AMELY, VOORHEIS ([id number]) -------------------------------------------------------------------------------- Fall Risk Assessment Details Patient Name: [name]. Date of Service: 08/17/2014 10:15  AM Medical Record Number: [id number] Patient Account Number: [id number] Date of Birth/Sex: 07-Aug-1955 (59 y.o. Female) Treating RN: Baruch Gouty, RN, BSN, Velva Harman Primary Care Physician: Clayborn Bigness Other Clinician: Referring Physician: Clayborn Bigness Treating Physician/Extender: Frann Rider in Treatment: 0 Fall Risk Assessment Items FALL RISK ASSESSMENT: History of falling - immediate or within 3 months 25 Yes Secondary diagnosis 0 No Ambulatory aid None/bed rest/wheelchair/nurse 0 Yes Crutches/cane/walker 0 No Furniture 0 No IV Access/Saline Lock 0 No Gait/Training Normal/bed rest/immobile 0 Yes Weak 0 No Impaired 0 No Mental Status Oriented to own ability 0 Yes Electronic Signature(s) Signed: 08/17/2014 10:54:24 AM By: Regan Lemming BSN,  RN Entered By: Regan Lemming on 08/17/2014 10:54:24 [name] ([id number]) -------------------------------------------------------------------------------- Foot Assessment Details Patient Name: [name], [name] Date of Service: 08/17/2014 10:15 AM Medical Record Number: [id number] Patient Account Number: [id number] Date of Birth/Sex: [birth year] (59 y.o. Female) Treating RN: Afful, RN, BSN, Velva Harman Primary Care Physician: Clayborn Bigness Other Clinician: Referring Physician: Clayborn Bigness Treating Physician/Extender: Frann Rider in Treatment: 0 Foot Assessment Items Site Locations + = Sensation present, - = Sensation absent, C = Callus, U = Ulcer R = Redness, W = Warmth, M = Maceration, PU = Pre-ulcerative lesion F = Fissure, S = Swelling, D = Dryness Assessment Right: Left: Other Deformity: No No Prior Foot Ulcer: No No Prior Amputation: No No Charcot Joint: No No Ambulatory Status: Ambulatory Without Help Gait: Steady Electronic Signature(s) Signed: 08/17/2014 10:55:15 AM By: Regan Lemming BSN, RN Entered By: Regan Lemming on 08/17/2014 10:55:15 [name]  ([id number]) -------------------------------------------------------------------------------- Nutrition Risk Assessment Details Patient Name: [name], [name] Date of Service: 08/17/2014 10:15 AM Medical Record Number: [id number] Patient Account Number: [id number] Date of Birth/Sex: [birth year] (59 y.o. Female) Treating RN: Afful, RN, BSN, [place] Primary Care Physician: Clayborn Bigness Other Clinician: Referring Physician: Clayborn Bigness Treating Physician/Extender: Frann Rider in Treatment: 0 Height (in): 68 Weight (lbs): 183 Body Mass Index (BMI): 27.8 Nutrition Risk Assessment Items NUTRITION RISK SCREEN: I have an illness or condition that made me change the kind and/or 0 No amount of food I eat I eat fewer than two meals per day 3 Yes I eat few fruits and vegetables, or milk products 0 No I have three or more drinks of beer, liquor or wine almost every day 0 No I have tooth or mouth problems that make it hard for me to eat 0 No I don't always have enough money to buy the food I need 0 No I eat alone most of the time 1 Yes I take three or more different prescribed or over-the-counter drugs a 0 No day Without wanting to, I have lost or gained 10 pounds in the last six 2 Yes months I am not always physically able to shop, cook and/or feed myself 2 Yes Nutrition Protocols Good Risk Protocol Moderate Risk Protocol Provide education on High Risk Proctocol 0 Electronic Signature(s) Signed: 08/17/2014 10:55:05 AM By: Regan Lemming BSN, RN Entered By: Regan Lemming on 08/17/2014 10:55:05

## 2014-08-17 NOTE — Progress Notes (Signed)
[name] ([id number]) Visit Report for 08/17/2014 Allergy List Details Patient Name: [name], [name]. Date of Service: 08/17/2014 10:15 AM Medical Record Number: [id number] Patient Account Number: [id number] Date of Birth/Sex: 1955-05-13 (59 y.o. Female) Treating RN: Tracy Gouty, RN, BSN, [name] Primary Care Physician: [name] Other Clinician: Referring Physician: Clayborn [name] Treating Physician/Extender: [name] in Treatment: 0 Allergies Active Allergies sulfur Allergy Notes Electronic Signature(s) Signed: 08/17/2014 10:46:11 AM By: [name] BSN, RN Entered By: [name] on 08/17/2014 10:46:10 [name] ([id number]) -------------------------------------------------------------------------------- Arrival Information Details Patient Name: [name]. Date of Service: 08/17/2014 10:15 AM Medical Record Number: [id number] Patient Account Number: [id number] Date of Birth/Sex: 1955-10-27 (59 y.o. Female) Treating RN: Tracy Gouty, RN, BSN, [name] Primary Care Physician: [name] Other Clinician: Referring Physician: Clayborn [name] Treating Physician/Extender: [name] in Treatment: 0 Visit Information Patient Arrived: Ambulatory Arrival Time: 10:41 Accompanied By: self Transfer Assistance: None Patient Identification Verified: Yes Secondary Verification Process Yes Completed: Patient Requires Transmission-Based No Precautions: Patient Has Alerts: No Electronic Signature(s) Signed: 08/17/2014 10:44:56 AM By: [name] BSN, RN Entered By: [name] on 08/17/2014 10:44:56 [name], [name] ([id number]) -------------------------------------------------------------------------------- Clinic Level of Care Assessment Details Patient Name: [name]. Date of Service: 08/17/2014 10:15 AM Medical Record Number: [id number] Patient Account Number: [id number] Date of Birth/Sex: 1955/12/21 (59 y.o. Female) Treating RN: Afful, RN,  BSN, [name] Primary Care Physician: [name] Other Clinician: Referring Physician: Clayborn [name] Treating Physician/Extender: [name] in Treatment: 0 Clinic Level of Care Assessment Items TOOL 2 Quantity Score '[]'$  - Use when only an EandM is performed on the INITIAL visit 0 ASSESSMENTS - Nursing Assessment / Reassessment X - General Physical Exam (combine w/ comprehensive assessment (listed just 1 20 below) when performed on new pt. evals) X - Comprehensive Assessment (HX, ROS, Risk Assessments, Wounds Hx, etc.) 1 25 ASSESSMENTS - Wound and Skin Assessment / Reassessment X - Simple Wound Assessment / Reassessment - one wound 1 5 '[]'$  - Complex Wound Assessment / Reassessment - multiple wounds 0 '[]'$  - Dermatologic / Skin Assessment (not related to wound area) 0 ASSESSMENTS - Ostomy and/or Continence Assessment and Care '[]'$  - Incontinence Assessment and Management 0 '[]'$  - Ostomy Care Assessment and Management (repouching, etc.) 0 PROCESS - Coordination of Care X - Simple Patient / Family Education for ongoing care 1 15 '[]'$  - Complex (extensive) Patient / Family Education for ongoing care 0 '[]'$  - Staff obtains Programmer, systems, Records, Test Results / Process Orders 0 X - Staff telephones HHA, Nursing Homes / Clarify orders / etc 1 10 '[]'$  - Routine Transfer to another Facility (non-emergent condition) 0 '[]'$  - Routine Hospital Admission (non-emergent condition) 0 X - New Admissions / Biomedical engineer / Ordering NPWT, Apligraf, etc. 1 15 '[]'$  - Emergency Hospital Admission (emergent condition) 0 X - Simple Discharge Coordination 1 10 [name], [name]. ([id number]) '[]'$  - Complex (extensive) Discharge Coordination 0 PROCESS - Special Needs '[]'$  - Pediatric / Minor Patient Management 0 '[]'$  - Isolation Patient Management 0 '[]'$  - Hearing / Language / Visual special needs 0 '[]'$  - Assessment of Community assistance (transportation, D/C planning, etc.) 0 '[]'$  - Additional assistance / Altered  mentation 0 '[]'$  - Support Surface(s) Assessment (bed, cushion, seat, etc.) 0 INTERVENTIONS - Wound Cleansing / Measurement X - Wound Imaging (photographs - any number of wounds) 1 5 '[]'$  - Wound Tracing (instead of photographs) 0 X - Simple Wound Measurement - one wound 1 5 '[]'$  - Complex Wound  Measurement - multiple wounds 0 X - Simple Wound Cleansing - one wound 1 5 '[]'$  - Complex Wound Cleansing - multiple wounds 0 INTERVENTIONS - Wound Dressings X - Small Wound Dressing one or multiple wounds 1 10 '[]'$  - Medium Wound Dressing one or multiple wounds 0 '[]'$  - Large Wound Dressing one or multiple wounds 0 '[]'$  - Application of Medications - injection 0 INTERVENTIONS - Miscellaneous '[]'$  - External ear exam 0 '[]'$  - Specimen Collection (cultures, biopsies, blood, body fluids, etc.) 0 '[]'$  - Specimen(s) / Culture(s) sent or taken to Lab for analysis 0 '[]'$  - Patient Transfer (multiple staff / Civil Service fast streamer / Similar devices) 0 '[]'$  - Simple Staple / Suture removal (25 or less) 0 '[]'$  - Complex Staple / Suture removal (26 or more) 0 [name], [name]. ([id number]) '[]'$  - Hypo / Hyperglycemic Management (close monitor of Blood Glucose) 0 '[]'$  - Ankle / Brachial Index (ABI) - do not check if billed separately 0 Has the patient been seen at the hospital within the last three years: Yes Total Score: 125 Level Of Care: New/Established - Level 4 Electronic Signature(s) Signed: 08/17/2014 11:16:45 AM By: [name] BSN, RN Entered By: [name] on 08/17/2014 11:16:44 [name], [name] ([id number]) -------------------------------------------------------------------------------- Encounter Discharge Information Details Patient Name: [name]. Date of Service: 08/17/2014 10:15 AM Medical Record Number: [id number] Patient Account Number: [id number] Date of Birth/Sex: [birth year] (59 y.o. Female) Treating RN: Tracy Gouty, RN, BSN, [name] Primary Care Physician: [name] Other Clinician: Referring Physician: Clayborn [name] Treating Physician/Extender: [name] in Treatment: 0 Encounter Discharge Information Items Discharge Pain Level: 0 Discharge Condition: Stable Ambulatory Status: Ambulatory Discharge Destination: Home Transportation: Other Accompanied By: self Schedule Follow-up Appointment: No Medication Reconciliation completed and provided to Patient/Care No Matricia Begnaud: Provided on Clinical Summary of Care: 08/17/2014 Form Type Recipient Paper Patient AR Electronic Signature(s) Signed: 08/17/2014 11:17:44 AM By: [name] BSN, RN Previous Signature: 08/17/2014 11:14:39 AM Version By: Ruthine Dose Entered By: [name] on 08/17/2014 11:17:44 [name], [name]. ([id number]) -------------------------------------------------------------------------------- Lower Extremity Assessment Details Patient Name: [name], [name]. Date of Service: 08/17/2014 10:15 AM Medical Record Number: [id number] Patient Account Number: [id number] Date of Birth/Sex: 1955-11-08 (59 y.o. Female) Treating RN: Afful, RN, BSN, [name] Primary Care Physician: [name] Other Clinician: Referring Physician: Clayborn [name] Treating Physician/Extender: [name] in Treatment: 0 Electronic Signature(s) Signed: 08/17/2014 10:45:54 AM By: [name] BSN, RN Entered By: [name] on 08/17/2014 10:45:54 [place], [name] ([id number]) -------------------------------------------------------------------------------- Multi Wound Chart Details Patient Name: [name], [name]. Date of Service: 08/17/2014 10:15 AM Medical Record Number: [id number] Patient Account Number: [id number] Date of Birth/Sex: March 18, 1955 (59 y.o. Female) Treating RN: Tracy Gouty, RN, BSN, [name] Primary Care Physician: [name] Other Clinician: Referring Physician: Clayborn [name] Treating Physician/Extender: [name] in Treatment: 0 Vital Signs Height(in): 68 Pulse(bpm): 77 Weight(lbs): 183 Blood  Pressure 106/48 (mmHg): Body Mass Index(BMI): 28 Temperature(F): 98.4 Respiratory Rate 18 (breaths/min): Photos: [1:No Photos] [N/A:N/A] Wound Location: [1:Right Gluteus - Medial] [N/A:N/A] Wounding Event: [1:Radiation Burn] [N/A:N/A] Primary Etiology: [1:Necrosis (Radiation)] [N/A:N/A] Comorbid History: [1:Chronic Obstructive Pulmonary Disease (COPD), Hypertension, Received Radiation] [N/A:N/A] Date Acquired: [1:05/24/2014] [N/A:N/A] Weeks of Treatment: [1:0] [N/A:N/A] Wound Status: [1:Open] [N/A:N/A] Measurements L x W x D 2x3.7x0.2 [N/A:N/A] (cm) Area (cm) : [1:5.812] [N/A:N/A] Volume (cm) : [1:1.162] [N/A:N/A] % Reduction in Area: [1:0.00%] [N/A:N/A] % Reduction in Volume: 0.00% [N/A:N/A] Classification: [1:Full Thickness Without Exposed Support Structures] [N/A:N/A] Exudate Amount: [1:Medium] [N/A:N/A] Exudate Type: [1:Serosanguineous] [N/A:N/A] Exudate Color: [1:red,  brown] [N/A:N/A] Foul Odor After [1:Yes] [N/A:N/A] Cleansing: Odor Anticipated Due to No [N/A:N/A] Product Use: Wound Margin: [1:Distinct, outline attached] [N/A:N/A] Granulation Amount: [1:Large (67-100%)] [N/A:N/A] Granulation Quality: [1:Red] [N/A:N/A] Necrotic Amount: Small (1-33%) N/A N/A Exposed Structures: Fascia: No N/A N/A Fat: No Tendon: No Muscle: No Joint: No Bone: No Limited to Skin Breakdown Epithelialization: None N/A N/A Periwound Skin Texture: Edema: No N/A N/A Excoriation: No Induration: No Callus: No Crepitus: No Fluctuance: No Friable: No Rash: No Scarring: No Periwound Skin Moist: Yes N/A N/A Moisture: Maceration: No Dry/Scaly: No Periwound Skin Color: Atrophie Blanche: No N/A N/A Cyanosis: No Ecchymosis: No Erythema: No Hemosiderin Staining: No Mottled: No Pallor: No Rubor: No Temperature: No Abnormality N/A N/A Tenderness on Yes N/A N/A Palpation: Wound Preparation: Ulcer Cleansing: N/A N/A Rinsed/Irrigated with Saline Topical Anesthetic Applied:  Other: lidocaine 4% Treatment Notes Electronic Signature(s) Signed: 08/17/2014 11:09:34 AM By: [name] BSN, RN Entered By: [name] on 08/17/2014 11:09:33 [name], [name] ([id number]) -------------------------------------------------------------------------------- Multi-Disciplinary Care Plan Details Patient Name: [name]. Date of Service: 08/17/2014 10:15 AM Medical Record Number: [id number] Patient Account Number: [id number] Date of Birth/Sex: 1955/10/06 (59 y.o. Female) Treating RN: Afful, RN, BSN, [name] Primary Care Physician: [name] Other Clinician: Referring Physician: Clayborn [name] Treating Physician/Extender: [name] in Treatment: 0 Active Inactive Abuse / Safety / Falls / Self Care Management Nursing Diagnoses: Impaired physical mobility Potential for falls Self care deficit: actual or potential Goals: Patient will remain injury free Date Initiated: 08/17/2014 Goal Status: Active Patient/caregiver will verbalize understanding of skin care regimen Date Initiated: 08/17/2014 Goal Status: Active Patient/caregiver will verbalize/demonstrate measure taken to improve self care Date Initiated: 08/17/2014 Goal Status: Active Patient/caregiver will verbalize/demonstrate measures taken to improve the patient's personal safety Date Initiated: 08/17/2014 Goal Status: Active Patient/caregiver will verbalize/demonstrate measures taken to prevent injury and/or falls Date Initiated: 08/17/2014 Goal Status: Active Patient/caregiver will verbalize/demonstrate understanding of what to do in case of emergency Date Initiated: 08/17/2014 Goal Status: Active Interventions: Assess fall risk on admission and as needed Assess: immobility, friction, shearing, incontinence upon admission and as needed Assess impairment of mobility on admission and as needed per policy Assess self care needs on admission and as needed Provide education on basic hygiene Provide  education on fall prevention Provide education on personal and home safety [name], [name] ([id number]) Provide education on safe transfers Notes: Orientation to the Wound Care Program Nursing Diagnoses: Knowledge deficit related to the wound healing center program Goals: Patient/caregiver will verbalize understanding of the [facility] Date Initiated: 08/17/2014 Goal Status: Active Interventions: Provide education on orientation to the wound center Notes: Wound/Skin Impairment Nursing Diagnoses: Impaired tissue integrity Knowledge deficit related to smoking impact on wound healing Knowledge deficit related to ulceration/compromised skin integrity Goals: Patient/caregiver will verbalize understanding of skin care regimen Date Initiated: 08/17/2014 Goal Status: Active Ulcer/skin breakdown will have a volume reduction of 30% by week 4 Date Initiated: 08/17/2014 Goal Status: Active Ulcer/skin breakdown will have a volume reduction of 50% by week 8 Date Initiated: 08/17/2014 Goal Status: Active Ulcer/skin breakdown will have a volume reduction of 80% by week 12 Date Initiated: 08/17/2014 Goal Status: Active Ulcer/skin breakdown will heal within 14 weeks Date Initiated: 08/17/2014 Goal Status: Active Interventions: Assess patient/caregiver ability to obtain necessary supplies Assess patient/caregiver ability to perform ulcer/skin care regimen upon admission and as needed [name], [name] ([id number]) Assess ulceration(s) every visit Provide education on smoking Provide education on ulcer and skin care Notes: Electronic  Signature(s) Signed: 08/17/2014 11:09:06 AM By: [name] BSN, RN Entered By: [name] on 08/17/2014 11:09:05 [name] ([id number]) -------------------------------------------------------------------------------- Pain Assessment Details Patient Name: [name]. Date of Service: 08/17/2014 10:15 AM Medical Record  Number: [id number] Patient Account Number: [id number] Date of Birth/Sex: [birth year] (60 y.o. Female) Treating RN: Tracy Gouty, RN, BSN, [name] Primary Care Physician: [name] Other Clinician: Referring Physician: Clayborn [name] Treating Physician/Extender: [name] in Treatment: 0 Active Problems Location of Pain Severity and Description of Pain Patient Has Paino Yes Site Locations Rate the pain. Current Pain Level: 7 Character of Pain Describe the Pain: Aching, Burning, Tender, Throbbing Pain Management and Medication Current Pain Management: Medication: Yes Massage: No Rest: Yes T.E.N.S.: No Activity: Yes Leg drop or elevation: No Is the Current Pain Management Adequate Adequate: How does your pain impact your activities of daily livingo Sleep: Yes Bathing: Yes Appetite: Yes Relationship With Others: Yes Bladder Continence: Yes Emotions: Yes Bowel Continence: Yes Work: Yes Toileting: Yes Drive: Yes Dressing: Yes Hobbies: Yes Electronic Signature(s) Signed: 08/17/2014 10:45:23 AM By: [name] BSN, RN [name], [name] ([id number]) Entered By: [name] on 08/17/2014 10:45:23 [name] ([id number]) -------------------------------------------------------------------------------- Patient/Caregiver Education Details Patient Name: [name]. Date of Service: 08/17/2014 10:15 AM Medical Record Number: [id number] Patient Account Number: [id number] Date of Birth/Gender: 26-Oct-1955 (59 y.o. Female) Treating RN: Tracy Gouty, RN, BSN, [name] Primary Care Physician: [name] Other Clinician: Referring Physician: Clayborn [name] Treating Physician/Extender: [name] in Treatment: 0 Education Assessment Education Provided To: Patient Education Topics Provided Basic Hygiene: Methods: Explain/Verbal Responses: State content correctly Safety: Methods: Explain/Verbal Responses: State content correctly Smoking and Wound Healing: Methods:  Explain/Verbal Responses: State content correctly Welcome To [facility]: Methods: Explain/Verbal Responses: State content correctly Wound/Skin Impairment: Methods: Explain/Verbal Responses: State content correctly Electronic Signature(s) Signed: 08/17/2014 11:18:06 AM By: [name] BSN, RN Entered By: [name] on 08/17/2014 11:18:06 [name], [name] ([id number]) -------------------------------------------------------------------------------- Wound Assessment Details Patient Name: [name], [name]. Date of Service: 08/17/2014 10:15 AM Medical Record Number: [id number] Patient Account Number: [id number] Date of Birth/Sex: 1955/10/10 (59 y.o. Female) Treating RN: Afful, RN, BSN, [place] Primary Care Physician: [name] Other Clinician: Referring Physician: Clayborn [name] Treating Physician/Extender: [name] in Treatment: 0 Wound Status Wound Number: 1 Primary Necrosis (Radiation) Etiology: Wound Location: Right Gluteus - Medial Wound Open Wounding Event: Radiation Burn Status: Date Acquired: 05/24/2014 Comorbid Chronic Obstructive Pulmonary Weeks Of Treatment: 0 History: Disease (COPD), Hypertension, Clustered Wound: No Received Radiation Photos Photo Uploaded By: [name] on 08/17/2014 17:18:21 Wound Measurements Length: (cm) 2 Width: (cm) 3.7 Depth: (cm) 0.2 Area: (cm) 5.812 Volume: (cm) 1.162 % Reduction in Area: 0% % Reduction in Volume: 0% Epithelialization: None Tunneling: No Undermining: No Wound Description Full Thickness Without Exposed Classification: Support Structures Wound Margin: Distinct, outline attached Exudate Medium Amount: Exudate Type: Serosanguineous Exudate Color: red, brown Foul Odor After Cleansing: Yes Due to Product Use: No Wound Bed Granulation Amount: Large (67-100%) Exposed Structure Granulation Quality: Red Fascia Exposed: No Harlacher, Roya [name]. ([id number]) Necrotic Amount: Small  (1-33%) Fat Layer Exposed: No Necrotic Quality: Adherent Slough Tendon Exposed: No Muscle Exposed: No Joint Exposed: No Bone Exposed: No Limited to Skin Breakdown Periwound Skin Texture Texture Color No Abnormalities Noted: No No Abnormalities Noted: No Callus: No Atrophie Blanche: No Crepitus: No Cyanosis: No Excoriation: No Ecchymosis: No Fluctuance: No Erythema: No Friable: No Hemosiderin Staining: No Induration: No Mottled: No Localized Edema: No Pallor: No Rash:  No Rubor: No Scarring: No Temperature / Pain Moisture Temperature: No Abnormality No Abnormalities Noted: No Tenderness on Palpation: Yes Dry / Scaly: No Maceration: No Moist: Yes Wound Preparation Ulcer Cleansing: Rinsed/Irrigated with Saline Topical Anesthetic Applied: Other: lidocaine 4%, Treatment Notes Wound #1 (Right, Medial Gluteus) 1. Cleansed with: Clean wound with Normal Saline 3. Peri-wound Care: Skin Prep 4. Dressing Applied: Prisma Ag 5. Secondary Dressing Applied Bordered Foam Dressing Electronic Signature(s) Signed: 08/17/2014 10:59:53 AM By: [name] BSN, RN Entered By: [name] on 08/17/2014 10:59:53 [name] ([id number]) -------------------------------------------------------------------------------- Vitals Details Patient Name: [name]. Date of Service: 08/17/2014 10:15 AM Medical Record Number: [id number] Patient Account Number: [id number] Date of Birth/Sex: [birth year] (59 y.o. Female) Treating RN: Afful, RN, BSN, [place] Primary Care Physician: [name] Other Clinician: Referring Physician: Clayborn [name] Treating Physician/Extender: [name] in Treatment: 0 Vital Signs Time Taken: 10:45 Temperature (F): 98.4 Height (in): 68 Pulse (bpm): 77 Source: Stated Respiratory Rate (breaths/min): 18 Weight (lbs): 183 Blood Pressure (mmHg): 106/48 Source: Stated Reference Range: 80 - 120 mg / dl Body Mass Index (BMI): 27.8 Electronic  Signature(s) Signed: 08/17/2014 11:02:12 AM By: [name] BSN, RN Previous Signature: 08/17/2014 10:45:48 AM Version By: [name] BSN, RN Entered By: [name] on 08/17/2014 11:02:12

## 2014-08-18 ENCOUNTER — Inpatient Hospital Stay: Payer: Commercial Managed Care - HMO

## 2014-08-18 ENCOUNTER — Inpatient Hospital Stay (HOSPITAL_BASED_OUTPATIENT_CLINIC_OR_DEPARTMENT_OTHER): Payer: Commercial Managed Care - HMO | Admitting: Hematology and Oncology

## 2014-08-18 ENCOUNTER — Ambulatory Visit
Admission: RE | Admit: 2014-08-18 | Discharge: 2014-08-18 | Disposition: A | Discharge status: Home / Self Care | Payer: Commercial Managed Care - HMO | Source: Ambulatory Visit | Attending: Radiation Oncology | Admitting: Radiation Oncology

## 2014-08-18 ENCOUNTER — Other Ambulatory Visit: Payer: Self-pay

## 2014-08-18 ENCOUNTER — Ambulatory Visit: Payer: Commercial Managed Care - HMO

## 2014-08-18 VITALS — BP 118/73 | HR 88 | Temp 96.8°F | Resp 18 | Ht 68.5 in | Wt 177.1 lb

## 2014-08-18 DIAGNOSIS — Z8701 Personal history of pneumonia (recurrent): Secondary | ICD-10-CM

## 2014-08-18 DIAGNOSIS — Z79899 Other long term (current) drug therapy: Secondary | ICD-10-CM

## 2014-08-18 DIAGNOSIS — J449 Chronic obstructive pulmonary disease, unspecified: Secondary | ICD-10-CM | POA: Diagnosis not present

## 2014-08-18 DIAGNOSIS — G629 Polyneuropathy, unspecified: Secondary | ICD-10-CM | POA: Diagnosis not present

## 2014-08-18 DIAGNOSIS — F319 Bipolar disorder, unspecified: Secondary | ICD-10-CM | POA: Diagnosis not present

## 2014-08-18 DIAGNOSIS — R0602 Shortness of breath: Secondary | ICD-10-CM

## 2014-08-18 DIAGNOSIS — F17210 Nicotine dependence, cigarettes, uncomplicated: Secondary | ICD-10-CM | POA: Diagnosis not present

## 2014-08-18 DIAGNOSIS — F209 Schizophrenia, unspecified: Secondary | ICD-10-CM | POA: Diagnosis not present

## 2014-08-18 DIAGNOSIS — D72819 Decreased white blood cell count, unspecified: Secondary | ICD-10-CM | POA: Diagnosis not present

## 2014-08-18 DIAGNOSIS — C210 Malignant neoplasm of anus, unspecified: Secondary | ICD-10-CM

## 2014-08-18 DIAGNOSIS — F4310 Post-traumatic stress disorder, unspecified: Secondary | ICD-10-CM

## 2014-08-18 DIAGNOSIS — M5136 Other intervertebral disc degeneration, lumbar region: Secondary | ICD-10-CM | POA: Diagnosis not present

## 2014-08-18 DIAGNOSIS — D696 Thrombocytopenia, unspecified: Secondary | ICD-10-CM | POA: Diagnosis not present

## 2014-08-18 DIAGNOSIS — Z86718 Personal history of other venous thrombosis and embolism: Secondary | ICD-10-CM

## 2014-08-18 DIAGNOSIS — E876 Hypokalemia: Secondary | ICD-10-CM | POA: Diagnosis not present

## 2014-08-18 DIAGNOSIS — F419 Anxiety disorder, unspecified: Secondary | ICD-10-CM

## 2014-08-18 DIAGNOSIS — M797 Fibromyalgia: Secondary | ICD-10-CM | POA: Diagnosis not present

## 2014-08-18 DIAGNOSIS — E039 Hypothyroidism, unspecified: Secondary | ICD-10-CM

## 2014-08-18 DIAGNOSIS — G4730 Sleep apnea, unspecified: Secondary | ICD-10-CM | POA: Diagnosis not present

## 2014-08-18 DIAGNOSIS — M4800 Spinal stenosis, site unspecified: Secondary | ICD-10-CM | POA: Diagnosis not present

## 2014-08-18 DIAGNOSIS — F329 Major depressive disorder, single episode, unspecified: Secondary | ICD-10-CM | POA: Diagnosis not present

## 2014-08-18 DIAGNOSIS — K219 Gastro-esophageal reflux disease without esophagitis: Secondary | ICD-10-CM

## 2014-08-18 DIAGNOSIS — C20 Malignant neoplasm of rectum: Secondary | ICD-10-CM

## 2014-08-18 DIAGNOSIS — I10 Essential (primary) hypertension: Secondary | ICD-10-CM | POA: Diagnosis not present

## 2014-08-18 DIAGNOSIS — J45909 Unspecified asthma, uncomplicated: Secondary | ICD-10-CM | POA: Diagnosis not present

## 2014-08-18 DIAGNOSIS — E041 Nontoxic single thyroid nodule: Secondary | ICD-10-CM | POA: Diagnosis not present

## 2014-08-18 DIAGNOSIS — Z510 Encounter for antineoplastic radiation therapy: Secondary | ICD-10-CM | POA: Diagnosis not present

## 2014-08-18 LAB — CBC WITH DIFFERENTIAL/PLATELET
Basophils Absolute: 0 10*3/uL (ref 0–0.1)
Basophils Relative: 1 %
Eosinophils Absolute: 0.1 10*3/uL (ref 0–0.7)
Eosinophils Relative: 3 %
HCT: 33.4 % — ABNORMAL LOW (ref 35.0–47.0)
Hemoglobin: 11.2 g/dL — ABNORMAL LOW (ref 12.0–16.0)
Lymphocytes Relative: 19 %
Lymphs Abs: 0.5 10*3/uL — ABNORMAL LOW (ref 1.0–3.6)
MCH: 32.5 pg (ref 26.0–34.0)
MCHC: 33.6 g/dL (ref 32.0–36.0)
MCV: 96.7 fL (ref 80.0–100.0)
Monocytes Absolute: 0.4 10*3/uL (ref 0.2–0.9)
Monocytes Relative: 15 %
Neutro Abs: 1.7 10*3/uL (ref 1.4–6.5)
Neutrophils Relative %: 62 %
Platelets: 31 10*3/uL — ABNORMAL LOW (ref 150–440)
RBC: 3.45 MIL/uL — ABNORMAL LOW (ref 3.80–5.20)
RDW: 20.6 % — ABNORMAL HIGH (ref 11.5–14.5)
WBC: 2.7 10*3/uL — ABNORMAL LOW (ref 3.6–11.0)

## 2014-08-18 LAB — COMPREHENSIVE METABOLIC PANEL
ALT: 15 U/L (ref 14–54)
AST: 34 U/L (ref 15–41)
Albumin: 2.8 g/dL — ABNORMAL LOW (ref 3.5–5.0)
Alkaline Phosphatase: 55 U/L (ref 38–126)
Anion gap: 4 — ABNORMAL LOW (ref 5–15)
BUN: <5 mg/dL — ABNORMAL LOW (ref 6–20)
CO2: 29 mmol/L (ref 22–32)
Calcium: 8.3 mg/dL — ABNORMAL LOW (ref 8.9–10.3)
Chloride: 102 mmol/L (ref 101–111)
Creatinine, Ser: 0.69 mg/dL (ref 0.44–1.00)
GFR calc Af Amer: >60 mL/min (ref >60)
GFR calc non Af Amer: >60 mL/min (ref >60)
Glucose, Bld: 85 mg/dL (ref 65–99)
Potassium: 3 mmol/L — ABNORMAL LOW (ref 3.5–5.1)
Sodium: 135 mmol/L (ref 135–145)
Total Bilirubin: 0.7 mg/dL (ref 0.3–1.2)
Total Protein: 6.3 g/dL — ABNORMAL LOW (ref 6.5–8.1)

## 2014-08-18 MED ORDER — OXYCODONE-ACETAMINOPHEN 10-325 MG PO TABS: 1.0000 | ORAL_TABLET | Freq: Four times a day (QID) | ORAL | Status: DC | PRN

## 2014-08-18 MED ORDER — CLONAZEPAM 1 MG PO TABS: 1.0000 mg | ORAL_TABLET | Freq: Three times a day (TID) | ORAL | Status: DC | PRN

## 2014-08-18 NOTE — Progress Notes (Signed)
HAZYL, MARSEILLE ([id number]) Visit Report for 08/17/2014 Chief Complaint Document Details Patient Name: [name], [name]. Date of Service: 08/17/2014 10:15 AM Medical Record Number: [id number] Patient Account Number: [id number] Date of Birth/Sex: 1955/03/25 (59 y.o. Female) Treating RN: Baruch Gouty, RN, BSN, Velva Harman Primary Care Physician: Clayborn Bigness Other Clinician: Referring Physician: Clayborn Bigness Treating Physician/Extender: Frann Rider in Treatment: 0 Information Obtained from: Patient Chief Complaint Patient presents to the wound care center for a consult due non healing wound. 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 Electronic Signature(s) Signed: 08/17/2014 11:22:28 AM By: Christin Fudge MD, FACS Entered By: Christin Fudge on 08/17/2014 11:22:27 [name] ([id number]) -------------------------------------------------------------------------------- HPI Details Patient Name: [name]. Date of Service: 08/17/2014 10:15 AM Medical Record Number: [id number] Patient Account Number: [id number] Date of Birth/Sex: [birth year] (59 y.o. Female) Treating RN: Baruch Gouty, RN, BSN, Velva Harman Primary Care Physician: Clayborn Bigness Other Clinician: Referring Physician: Clayborn Bigness Treating Physician/Extender: Frann Rider in Treatment: 0 History of Present Illness Location: anal region and right gluteal area Quality: Patient reports experiencing a sharp pain to affected area(s). Severity: Patient states wound are getting worse. Duration: Patient has had the wound for < 2 weeks prior to presenting for treatment Timing: Pain in wound is constant (hurts all the time) Context: The wound occurred when the patient has been receiving radiation therapy for an anal cancer Modifying Factors: Other treatment(s) tried include:he has been using Silvadene ointment over the wound Associated Signs and Symptoms: Patient reports having difficulty  sitting for long periods. 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 She is receiving cycles of 5-FU and mitomycin-C with radiation. She has been sent to [name] for wound care for addition proctitis and a ulcerated area on the left gluteal region and the perianal vicinity. She tells me that her chemotherapy has been completed last week but she has taken a break from radiation therapy. Past medical history significant for schizophrenia, asthma, GERD, anxiety depression, bipolar disorder, COPD, PTSD, DVT, peripheral neuropathy, anemia, obstructive sleep apnea. Past surgical history includes recent rectal biopsy, laparoscopic diverting colostomy, hemorrhoidectomy, Port-A-Cath placement. Electronic Signature(s) Signed: 08/17/2014 11:24:35 AM By: Christin Fudge MD, FACS Previous Signature: 08/17/2014 10:55:44 AM Version By: Christin Fudge MD, FACS Entered By: Christin Fudge on 08/17/2014 11:24:34 [name] ([id number]) -------------------------------------------------------------------------------- Physical Exam Details Patient Name: [name], [name] Date of Service: 08/17/2014 10:15 AM Medical Record Number: [id number] Patient Account Number: [id number] Date of Birth/Sex: 05/10/1955 (59 y.o. Female) Treating RN: Baruch Gouty, RN, BSN, Velva Harman Primary Care Physician: Clayborn Bigness Other Clinician: Referring Physician: Clayborn Bigness Treating Physician/Extender: Frann Rider in Treatment: 0 Constitutional . Pulse regular. Respirations normal and unlabored. Afebrile. . Eyes Nonicteric. Reactive to light. Ears, Nose, Mouth, and Throat Lips, teeth, and gums WNL.Marland Kitchen Moist mucosa without lesions . Neck supple and nontender. No palpable supraclavicular or cervical adenopathy. Normal sized without goiter. Respiratory WNL. No retractions.. Cardiovascular Pedal Pulses WNL. No clubbing,  cyanosis or edema. Chest Breasts symmetical and no nipple discharge.. Breast tissue WNL, no masses, lumps, or tenderness.. Genitourinary (GU) No hydrocele, spermatocele, tenderness of the cord, or testicular mass.Marland Kitchen Penis without lesions.Lowella Fairy without lesions. No cystocele, or rectocele. Pelvic support intact, no discharge. Marland Kitchen Urethra without masses, tenderness or scarring.Marland Kitchen Lymphatic No adneopathy. No adenopathy. No adenopathy. Musculoskeletal Adexa without tenderness or enlargement.. Digits and nails w/o clubbing, cyanosis, infection, petechiae, ischemia, or  inflammatory conditions.. Integumentary (Hair, Skin) No suspicious lesions. No crepitus or fluctuance. No peri-wound warmth or erythema. No masses.Marland Kitchen Psychiatric Judgement and insight Intact.. No evidence of depression, anxiety, or agitation.. Notes In the left perianal region just at the anal verge she has got a large ulcerated area with a deep tender ulcer with fat exposed. The base of the ulcer is clean. Electronic Signature(s) Signed: 08/17/2014 11:25:41 AM By: Christin Fudge MD, FACS Entered By: Christin Fudge on 08/17/2014 11:25:41 MARIZOL, BORROR ([id number], LAFEVERS ([id number]) -------------------------------------------------------------------------------- Physician Orders Details Patient Name: [name]. Date of Service: 08/17/2014 10:15 AM Medical Record Number: [id number] Patient Account Number: [id number] Date of Birth/Sex: [birth year] (59 y.o. Female) Treating RN: Baruch Gouty, RN, BSN, Velva Harman Primary Care Physician: Clayborn Bigness Other Clinician: Referring Physician: Clayborn Bigness Treating Physician/Extender: Frann Rider in Treatment: 0 Verbal / Phone Orders: Yes Clinician: Afful, RN, BSN, Rita Read Back and Verified: Yes Diagnosis Coding Wound Cleansing Wound #1 Right,Medial Gluteus o Clean wound with Normal Saline. o Cleanse wound with mild soap and water o May Shower, gently  pat wound dry prior to applying new dressing. Anesthetic Wound #1 Right,Medial Gluteus o Topical Lidocaine 4% cream applied to wound bed prior to debridement Skin Barriers/Peri-Wound Care Wound #1 Right,Medial Gluteus o Skin Prep Primary Wound Dressing Wound #1 Right,Medial Gluteus o Prisma Ag Secondary Dressing Wound #1 Right,Medial Gluteus o Boardered Foam Dressing Dressing Change Frequency Wound #1 Right,Medial Gluteus o Change dressing every other day. Follow-up Appointments Wound #1 Right,Medial Gluteus o Return Appointment in 1 week. Home Health Wound #1 [facility] o [facility] - [place] for wound care o Home Health Nurse may visit PRN to address patientos wound care needs. [name], [name] ([id number]) o FACE TO FACE ENCOUNTER: MEDICARE and MEDICAID PATIENTS: I certify that this patient is under my care and that I had a face-to-face encounter that meets the physician face-to-face encounter requirements with this patient on this date. The encounter with the patient was in whole or in part for the following MEDICAL CONDITION: (primary reason for [place]) MEDICAL NECESSITY: I certify, that based on my findings, NURSING services are a medically necessary home health service. HOME BOUND STATUS: I certify that my clinical findings support that this patient is homebound (i.e., Due to illness or injury, pt requires aid of supportive devices such as crutches, cane, wheelchairs, walkers, the use of special transportation or the assistance of another person to leave their place of residence. There is a normal inability to leave the home and doing so requires considerable and taxing effort. Other absences are for medical reasons / religious services and are infrequent or of short duration when for other reasons). o If current dressing causes  regression in wound condition, may D/C ordered dressing product/s and apply Normal Saline Moist Dressing daily until next [place] / Other MD appointment. [place] of regression in wound condition at [phone]. o Please direct any NON-WOUND related issues/requests for orders to patient's Primary Care Physician Electronic Signature(s) Signed: 08/17/2014 11:53:13 AM By: Regan Lemming BSN, RN Signed: 08/17/2014 1:29:04 PM By: Christin Fudge MD, FACS Previous Signature: 08/17/2014 11:15:54 AM Version By: Regan Lemming BSN, RN Entered By: Regan Lemming on 08/17/2014 11:53:13 [place], [name] ([id number]) -------------------------------------------------------------------------------- Problem List Details Patient Name: Bernier, Haedyn P. Date of Service: 08/17/2014 10:15 AM Medical Record Number: [id number] Patient Account Number: [id number] Date of  Birth/Sex: 20-Oct-1955 (59 y.o. 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 Date of Service: 08/17/2014 10:15 AM Medical Record Number: [id number] Patient Account Number: [id number] Date of  Birth/Sex: 1955/06/21 (59 y.o. Female) Treating RN: Baruch Gouty, RN, BSN, Velva Harman Primary Care Physician: Clayborn Bigness Other Clinician: Referring Physician: Clayborn Bigness Treating Physician/Extender: Frann Rider in Treatment: 0 Subjective Chief Complaint Information obtained from Patient Patient presents to the wound care center for a consult due non healing wound. 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 History of Present Illness (HPI) The following HPI elements were documented for the patient's wound: Location: anal region and right gluteal area Quality: Patient reports experiencing a sharp pain to affected area(s). Severity: Patient states wound are getting worse. Duration: Patient has had the wound for < 2 weeks prior to presenting for treatment Timing: Pain in wound is constant (hurts all the time) Context: The wound occurred when the patient has been receiving radiation therapy for an anal cancer Modifying Factors: Other treatment(s) tried include:he has been using Silvadene ointment over the wound Associated Signs and Symptoms: Patient reports having difficulty sitting for long periods. 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 She is receiving cycles of 5-FU and mitomycin-C with radiation. She has been sent to [name] for wound care for addition proctitis and a ulcerated area on the left gluteal region and the perianal vicinity. She tells me that her chemotherapy has been completed last week but she has taken a break from radiation therapy. Past medical history significant for schizophrenia, asthma, GERD, anxiety depression, bipolar disorder, COPD, PTSD, DVT, peripheral neuropathy, anemia, obstructive sleep apnea. Past surgical history includes recent rectal biopsy, laparoscopic diverting colostomy, hemorrhoidectomy, Port-A-Cath placement. Wound  History Patient presents with 1 open wound that has been present for approximately 87month Patient has been treating wound in the following manner: SSD. Laboratory tests have been performed in the last month. Patient reportedly has not tested positive for an antibiotic resistant organism. Patient reportedly has not tested positive for osteomyelitis. Patient reportedly has not had testing performed to evaluate circulation in the legs. Patient experiences the following problems associated with their wounds: infection. Patient History Allergies sulfur RCARLENE, BICKLEY([id number] Family History Cancer - Maternal Grandparents, Diabetes - Mother, Heart Disease - Maternal Grandparents, Hypertension - Father, Paternal Grandparents, Kidney Disease - Mother, Lung Disease - Mother, Stroke - Maternal Grandparents, Thyroid Problems - Mother, No family history of Hereditary Spherocytosis, Seizures, Tuberculosis. Social History Current some day smoker - 1 pack/3days, Marital Status - Separated, Alcohol Use - Never, Drug Use - No History. Medical History Eyes Denies history of Cataracts, Glaucoma, Optic Neuritis Ear/Nose/Mouth/Throat Denies history of Chronic sinus problems/congestion, Middle ear problems Hematologic/Lymphatic Denies history of Anemia, Hemophilia, Human Immunodeficiency Virus, Lymphedema, Sickle Cell Disease Respiratory Patient has history of Chronic Obstructive  Pulmonary Disease (COPD) Cardiovascular Patient has history of Hypertension Gastrointestinal Denies history of Cirrhosis , Colitis, Crohn s, Hepatitis A, Hepatitis B, Hepatitis C Endocrine Denies history of Type I Diabetes, Type II Diabetes Genitourinary Denies history of End Stage Renal Disease Immunological Denies history of Lupus Erythematosus, Raynaud s, Scleroderma Integumentary (Skin) Denies history of History of Burn, History of pressure wounds Musculoskeletal Denies history of Gout, Rheumatoid Arthritis,  Osteoarthritis, Osteomyelitis Neurologic Denies history of Dementia, Neuropathy, Quadriplegia, Paraplegia, Seizure Disorder Oncologic Patient has history of Received Radiation - anal cancer Psychiatric Denies history of Anorexia/bulimia, Confinement Anxiety Hospitalization/Surgery History - [facility], depression. Medical And Surgical History Notes Respiratory emphysema Musculoskeletal DJD, Neurologic spinal stenosis [name], [name] ([id number]) Psychiatric hx depression Review of Systems (ROS) Constitutional Symptoms (General Health) The patient has no complaints or symptoms. Eyes Complains or has symptoms of Glasses / Contacts. Ear/Nose/Mouth/Throat The patient has no complaints or symptoms. Hematologic/Lymphatic The patient has no complaints or symptoms. Respiratory The patient has no complaints or symptoms. Cardiovascular The patient has no complaints or symptoms. Gastrointestinal The patient has no complaints or symptoms. Endocrine The patient has no complaints or symptoms. Immunological The patient has no complaints or symptoms. Integumentary (Skin) Complains or has symptoms of Wounds, Breakdown. Musculoskeletal The patient has no complaints or symptoms. Neurologic The patient has no complaints or symptoms. Oncologic The patient has no complaints or symptoms. Psychiatric The patient has no complaints or symptoms. Medications oxycodone-acetaminophen 10 mg-325 mg tablet oral 1 1 tablet oral tramadol 50 mg tablet oral 1 1 tablet oral Spiriva with HandiHaler 18 mcg and inhalation capsules inhalation 1 1 capsule, w/inhalation device inhalation clonazepam 1 mg tablet oral 1 1 tablet oral ondansetron 8 mg disintegrating tablet oral 1 1 tablet,disintegrating oral simvastatin 40 mg tablet oral 1 1 tablet oral albuterol sulfate 2.5 mg/3 mL (0.083 %) solution for nebulization inhalation solution for nebulization inhalation Symbicort 160 mcg-4.5 mcg/actuation HFA  aerosol inhaler inhalation 2 2 HFA aerosol inhaler inhalation calcium carbonate-vitamin D3 600 mg (1,500 mg)-800 unit tablet oral 1 1 tablet oral ranitidine 150 mg tablet oral 1 1 tablet oral docusate sodium 100 mg capsule oral 2 1 capsule oral [name], [name] ([id number]) senna 8.6 mg tablet oral 2 2 tablet oral clindamycin 300 mg capsule oral 1 1 capsule oral etodolac 500 mg tablet oral 1 1 tablet oral potassium chloride ER 10 mEq tablet,extended release oral 1 1 tablet extended release oral zolpidem 5 mg tablet oral 1 1 tablet oral escitalopram 20 mg tablet oral 1 1 tablet oral duloxetine 60 mg capsule,delayed release oral 1 1 capsule,delayed release(DR/EC) oral Silvadene 1 % topical cream topical cream topical oxybutynin chloride 5 mg tablet oral tablet oral Objective Constitutional Pulse regular. Respirations normal and unlabored. Afebrile. Vitals Time Taken: 10:45 AM, Height: 68 in, Source: Stated, Weight: 183 lbs, Source: Stated, BMI: 27.8, Temperature: 98.4 F, Pulse: 77 bpm, Respiratory Rate: 18 breaths/min, Blood Pressure: 106/48 mmHg. Eyes Nonicteric. Reactive to light. Ears, Nose, Mouth, and Throat Lips, teeth, and gums WNL.Marland Kitchen Moist mucosa without lesions . Neck supple and nontender. No palpable supraclavicular or cervical adenopathy. Normal sized without goiter. Respiratory WNL. No retractions.. Cardiovascular Pedal Pulses WNL. No clubbing, cyanosis or edema. Chest Breasts symmetical and no nipple discharge.. Breast tissue WNL, no masses, lumps, or tenderness.. Genitourinary (GU) No hydrocele, spermatocele, tenderness of the cord, or testicular mass.Marland Kitchen Penis without lesions.Lowella Fairy without lesions. No cystocele, or rectocele. Pelvic support intact, no discharge. Marland Kitchen Urethra without masses, tenderness or scarring.Marland Kitchen Lymphatic No adneopathy.  No adenopathy. No adenopathy. Musculoskeletal [name], MAESTAS. ([id number]) Adexa without tenderness or enlargement..  Digits and nails w/o clubbing, cyanosis, infection, petechiae, ischemia, or inflammatory conditions.Marland Kitchen Psychiatric Judgement and insight Intact.. No evidence of depression, anxiety, or agitation.. General Notes: In the left perianal region just at the anal verge she has got a large ulcerated area with a deep tender ulcer with fat exposed. The base of the ulcer is clean. Integumentary (Hair, Skin) No suspicious lesions. No crepitus or fluctuance. No peri-wound warmth or erythema. No masses.. Wound #1 status is Open. Original cause of wound was Radiation Burn. The wound is located on the Right,Medial Gluteus. The wound measures 2cm length x 3.7cm width x 0.2cm depth; 5.812cm^2 area and 1.162cm^3 volume. The wound is limited to skin breakdown. There is no tunneling or undermining noted. There is a medium amount of serosanguineous drainage noted. The wound margin is distinct with the outline attached to the wound base. There is large (67-100%) red granulation within the wound bed. There is a small (1-33%) amount of necrotic tissue within the wound bed including Adherent Slough. The periwound skin appearance exhibited: Moist. The periwound skin appearance did not exhibit: Callus, Crepitus, Excoriation, Fluctuance, Friable, Induration, Localized Edema, Rash, Scarring, Dry/Scaly, Maceration, Atrophie Blanche, Cyanosis, Ecchymosis, Hemosiderin Staining, Mottled, Pallor, Rubor, Erythema. Periwound temperature was noted as No Abnormality. The periwound has tenderness on palpation. 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 She has got some excoriation of skin on the  low back and gluteal area from radiation but there is no definite radiation injury elsewhere. Depending on the patient completing her chemotherapy cycle and if she is taking a break from radiation we may be able to give her some hyperbaric oxygen therapy. I will discuss with her medical and radiation [name], [name] ([id number]) oncologist and get the exact treatment plan. In the meanwhile I have recommended Prisma AG to be applied over the base of the ulceration and details of this have been discussed with her. Also spent some time discussing the complete stoppage of smoking and have discussed the negative effects of nicotine on wound healing. I have motivated to do smoking cessation and she will discuss with her PCP regarding nicotine substitutes. Come back and see me next week. Plan Wound Cleansing: Wound #1 Right,Medial Gluteus: Clean wound with Normal Saline. Cleanse wound with mild soap and water May Shower, gently pat wound dry prior to applying new dressing. Anesthetic: Wound #1 Right,Medial Gluteus: Topical Lidocaine 4% cream applied to wound bed prior to debridement Skin Barriers/Peri-Wound Care: Wound #1 Right,Medial Gluteus: Skin Prep Primary Wound Dressing: Wound #1 Right,Medial Gluteus: Prisma Ag Secondary Dressing: Wound #1 Right,Medial Gluteus: Boardered Foam Dressing Dressing Change Frequency: Wound #1 Right,Medial Gluteus: Change dressing every other day. Follow-up Appointments: Wound #1 Right,Medial Gluteus: Return Appointment in 1 week. Home Health: Wound #1 Right,Medial Gluteus: [place] [facility] Nurse may visit PRN to address patient s wound care needs. FACE TO FACE ENCOUNTER: MEDICARE and MEDICAID PATIENTS: I certify that this patient is under my care and that I had a face-to-face encounter that meets the physician face-to-face encounter requirements with this patient on this date. The  encounter with the patient was in whole or in part for the following MEDICAL CONDITION: (primary reason for [place]) MEDICAL NECESSITY: I certify, that based on my findings, NURSING services are a medically necessary home health service. HOME BOUND STATUS: I certify that my clinical findings support that this patient is homebound (i.e., Due to illness or injury, pt requires aid of supportive devices such as crutches, cane, wheelchairs, walkers, the use of special transportation or the assistance of another person to leave their place of residence. There is a normal inability to leave the home and doing so requires considerable and taxing effort. Other absences are for medical reasons / religious services and are infrequent or of short duration when for other reasons). If current dressing causes regression in wound condition, may D/C ordered dressing product/s and apply SHINA, WASS. ([id number]) Normal Saline Moist Dressing daily until next [place] / Other MD appointment. [place] of regression in wound condition at [phone]. Please direct any NON-WOUND related issues/requests for orders to patient's Primary Care Physician The patient has a rather large anal and gluteal ulcer on the right perianal area and this is extremely tender to touch. She has got some excoriation of skin on the low back and gluteal area from radiation but there is no definite radiation injury elsewhere. Depending on the patient completing her chemotherapy cycle and if she is taking a break from radiation we may be able to give her some hyperbaric oxygen therapy. I will discuss with her medical and radiation oncologist and get the exact treatment plan. In the meanwhile I have recommended Prisma AG to be applied over the base of the ulceration and details of this have been discussed with her. Also spent some time discussing the complete stoppage of smoking and have discussed  the negative effects of nicotine on wound healing. I have motivated to do smoking cessation and she will discuss with her PCP regarding nicotine substitutes. Come back and see me next week. Electronic Signature(s) Signed: 08/17/2014 11:29:27 AM By: Christin Fudge MD, FACS Previous Signature: 08/17/2014 11:29:18 AM Version By: Christin Fudge MD, FACS Entered By: Christin Fudge on 08/17/2014 11:29:26 [name], [name] ([id number]) -------------------------------------------------------------------------------- ROS/PFSH Details Patient Name: Tracy Picket P. Date of Service: 08/17/2014 10:15 AM Medical Record Number: [id number] Patient Account Number: [id number] Date of Birth/Sex: 05-30-1955 (59 y.o. Female) Treating RN: Afful, RN, BSN, [place] Primary Care Physician: Clayborn Bigness Other Clinician: Referring Physician: Clayborn Bigness Treating Physician/Extender: Frann Rider in Treatment: 0 Wound History Do you currently have one or more open woundso Yes How many open wounds do you currently haveo 1 Approximately how long have you had your woundso 60monthHow have you been treating your wound(s) until nowo SSD Has your wound(s) ever healed and then re-openedo No Have you had any lab work done in the past montho Yes Have you tested positive for an antibiotic resistant organism (MRSA, VRE)o No Have you tested positive for osteomyelitis (bone infection)o No Have you had any tests for circulation on your legso No Have you had other problems associated with your woundso Infection Eyes Complaints and Symptoms: Positive for: Glasses / Contacts Medical History: Negative for: Cataracts; Glaucoma; Optic Neuritis Integumentary (Skin) Complaints and Symptoms: Positive for: Wounds; Breakdown Medical History: Negative for: History of Burn; History of pressure wounds Constitutional Symptoms (General Health) Complaints and Symptoms: No Complaints or Symptoms Ear/Nose/Mouth/Throat Complaints and  Symptoms: No Complaints or Symptoms Medical History: Negative for: Chronic sinus problems/congestion; Middle ear problems  Hematologic/Lymphatic ETTIE, KRONTZ ([id number]) Complaints and Symptoms: No Complaints or Symptoms Medical History: Negative for: Anemia; Hemophilia; Human Immunodeficiency Virus; Lymphedema; Sickle Cell Disease Respiratory Complaints and Symptoms: No Complaints or Symptoms Medical History: Positive for: Chronic Obstructive Pulmonary Disease (COPD) Past Medical History Notes: emphysema Cardiovascular Complaints and Symptoms: No Complaints or Symptoms Medical History: Positive for: Hypertension Gastrointestinal Complaints and Symptoms: No Complaints or Symptoms Medical History: Negative for: Cirrhosis ; Colitis; Crohnos; Hepatitis A; Hepatitis B; Hepatitis C Endocrine Complaints and Symptoms: No Complaints or Symptoms Medical History: Negative for: Type I Diabetes; Type II Diabetes Genitourinary Medical History: Negative for: End Stage Renal Disease Immunological Complaints and Symptoms: No Complaints or Symptoms Medical HistorySHERRYANN, [name] ([id number]) Negative for: Lupus Erythematosus; Raynaudos; Scleroderma Musculoskeletal Complaints and Symptoms: No Complaints or Symptoms Medical History: Negative for: Gout; Rheumatoid Arthritis; Osteoarthritis; Osteomyelitis Past Medical History Notes: DJD, Neurologic Complaints and Symptoms: No Complaints or Symptoms Medical History: Negative for: Dementia; Neuropathy; Quadriplegia; Paraplegia; Seizure Disorder Past Medical History Notes: spinal stenosis Oncologic Complaints and Symptoms: No Complaints or Symptoms Medical History: Positive for: Received Radiation - anal cancer Psychiatric Complaints and Symptoms: No Complaints or Symptoms Medical History: Negative for: Anorexia/bulimia; Confinement Anxiety Past Medical History Notes: hx depression Hospitalization / Surgery  History Name of Hospital Purpose of Hospitalization/Surgery Date [facility] depression Family and Social History Cancer: Yes - Maternal Grandparents; Diabetes: Yes - Mother; Heart Disease: Yes - Maternal Grandparents; Hereditary Spherocytosis: No; Hypertension: Yes - Father, Paternal Grandparents; Kidney Disease: Yes - Mother; Lung Disease: Yes - Mother; Seizures: No; Stroke: Yes - Maternal Grandparents; Thyroid Problems: Yes - Mother; Tuberculosis: No; Current some day smoker - 1 pack/3days; Marital Status - Separated; Alcohol Use: Never; Drug Use: No History; Financial Concerns: No; Food, Clothing or CORYN, MOSSO. ([id number]) Shelter Needs: No; Support System Lacking: No; Transportation Concerns: No; Advanced Directives: Yes (Not Provided); Patient does not want information on Advanced Directives; Living Will: Yes (Not Provided) Physician Affirmation I have reviewed and agree with the above information. Electronic Signature(s) Signed: 08/17/2014 10:54:12 AM By: Regan Lemming BSN, RN Signed: 08/17/2014 1:29:04 PM By: Christin Fudge MD, FACS Previous Signature: 08/17/2014 10:52:32 AM Version By: Christin Fudge MD, FACS Previous Signature: 08/17/2014 10:51:34 AM Version By: Regan Lemming BSN, RN Entered By: Regan Lemming on 08/17/2014 10:54:10 [name], FRYER ([id number]) -------------------------------------------------------------------------------- SuperBill Details Patient Name: Chichester, Cesilia P. Date of Service: 08/17/2014 Medical Record Number: [id number] Patient Account Number: [id number] Date of Birth/Sex: 1955/02/09 (59 y.o. 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 F17.218 Nicotine dependence, cigarettes, with other nicotine-induced disorders Facility Procedures CPT4 Code Description: 25427062 99214 - WOUND CARE VISIT-LEV 4 EST PT Modifier: Quantity: 1 CPT4 Code Description: 37628315 99406-SMOKING CESSATION 3-10MINS ICD-10 Description Diagnosis F17.218 Nicotine dependence, cigarettes, with other nicoti Modifier: ne-induced di Quantity: 1 sorders Physician Procedures CPT4 Code Description: 1761607 37106 - WC PHYS LEVEL 4 - NEW PT ICD-10 Description Diagnosis 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 ([id number]) Entered By: Christin Fudge on 08/17/2014 11:29:48

## 2014-08-18 NOTE — Progress Notes (Unsigned)
Called and spoke with pt about potassium of 3.0.  Explained per verbal order pt needs to take 10 meq twice daily and to eat potassium rich foods.  Pt verbalized an understanding.

## 2014-08-18 NOTE — Progress Notes (Signed)
Pt reports having pain at rectum where it is leaking. Pt is being seen at wound care.  Reports having relief with dressing done.

## 2014-08-18 NOTE — Progress Notes (Signed)
[place] [facility] day:  08/18/2014  Chief Complaint: [name] is a 59 y.o. 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  HPI:  The patient was last seen in the medical oncology clinic on 07/31/2014.  At that time, she was seen for assessment prior to cycle #2 of therapy.  Her anal leakage had to decreased. She had some perirectal breakdown.  She began cycle #2.  She returned on 08/04/2014 for pump disconnect.  During the interim, the patient states that she missed her last appointment as she checked herself into behavioral health. She was voluntarily committed. She left on Monday. Her rectal pain and burning is better. She is going to wound care.  She is receiving daily radiation.  Past Medical History  Diagnosis Date  . Schizophrenia   . Asthma   . GERD (gastroesophageal reflux disease)   . Anxiety   . Depression   . Bipolar disorder   . COPD (chronic obstructive pulmonary disease)   . Occasional tremors     right hand  . PTSD (post-traumatic stress disorder)   . Shortness of breath dyspnea   . Fibromyalgia   . DVT (deep venous thrombosis) 2011    RUE  . Thyroid nodule   . DDD (degenerative disc disease), lumbar   . Spinal stenosis   . Peripheral neuropathy   . Rotator cuff tear     right  . Pneumonia 2011  . Hypothyroidism     no meds currently  . Anemia     during pregnancy only  . Hypertension     Off meds x 15 years-well controlled now per pt  . Squamous cell cancer, anus   . Severe obstructive sleep apnea 06/27/2014    Past Surgical History  Procedure Laterality Date  . Foot surgery Right   . Tubal ligation    . Eye surgery Bilateral   . Mouth surgery  2002  . Rectal biopsy N/A 05/08/2014    Procedure: BIOPSY RECTAL;  Surgeon: Marlyce Huge, MD;  Location: [facility] ORS;  Service: General;  Laterality: N/A;   . Evaluation under anesthesia with hemorrhoidectomy N/A 05/08/2014    Procedure: EXAM UNDER ANESTHESIA WITH HEMORRHOIDECTOMY;  Surgeon: Marlyce Huge, MD;  Location: [facility] ORS;  Service: General;  Laterality: N/A;  . Portacath placement N/A 05/20/2014    Procedure: INSERTION PORT-A-CATH;  Surgeon: Florene Glen, MD;  Location: [facility] ORS;  Service: General;  Laterality: N/A;  . Laparoscopic diverted colostomy N/A 05/26/2014    Procedure: LAPAROSCOPIC DIVERTED COLOSTOMY;  Surgeon: Marlyce Huge, MD;  Location: [facility] ORS;  Service: General;  Laterality: N/A;    Family History  Problem Relation Age of Onset  . Cancer Maternal Aunt   . Cancer Paternal Uncle     Social History:  reports that she has been smoking Cigarettes.  She has a 11 pack-year smoking history. She has never used smokeless tobacco. She reports that she drinks alcohol. She reports that she does not use illicit drugs.  The patient is alone today.  Allergies:  Allergies  Allergen Reactions  . Sulfa Antibiotics Hives    Current Medications: Current Outpatient Prescriptions  Medication Sig Dispense Refill  . albuterol (PROVENTIL HFA;VENTOLIN HFA) 108 (90 BASE) MCG/ACT inhaler Inhale 1 puff into the lungs every 6 (six) hours as needed for wheezing or shortness of breath.    Marland Kitchen  albuterol (PROVENTIL) (2.5 MG/3ML) 0.083% nebulizer solution Inhale 3 mLs (2.5 mg total) into the lungs every 6 (six) hours as needed for wheezing or shortness of breath. 75 mL 12  . Alum & Mag Hydroxide-Simeth (MAGIC MOUTHWASH) SOLN Take 5 mLs by mouth 4 (four) times daily. 480 mL 3  . budesonide-formoterol (SYMBICORT) 160-4.5 MCG/ACT inhaler Inhale 2 puffs into the lungs 2 (two) times daily. 1 Inhaler 12  . Calcium Carb-Cholecalciferol (CALCIUM 600+D) 600-800 MG-UNIT TABS Take 1 tablet by mouth 2 (two) times daily.     . clindamycin (CLEOCIN) 300 MG capsule Take 300 mg by mouth 3 (three) times daily.    . clonazePAM (KLONOPIN) 1 MG tablet  Take 1 mg by mouth 3 (three) times daily as needed for anxiety.    . docusate sodium (COLACE) 100 MG capsule Take 2 capsules (200 mg total) by mouth 2 (two) times daily. (Patient taking differently: Take 200 mg by mouth 2 (two) times daily as needed for mild constipation. ) 120 capsule 0  . DULoxetine (CYMBALTA) 60 MG capsule Take 60 mg by mouth every evening.    . escitalopram (LEXAPRO) 20 MG tablet Take 20 mg by mouth at bedtime.    Marland Kitchen etodolac (LODINE) 500 MG tablet Take 500 mg by mouth 2 (two) times daily.    Marland Kitchen lidocaine (XYLOCAINE) 2 % jelly Apply 1 application topically as needed (for pain).    . ondansetron (ZOFRAN) 8 MG tablet Take 1 tablet (8 mg total) by mouth 2 (two) times daily. Start the day after chemo for 2 days. Then take as needed for nausea or vomiting. 30 tablet 1  . oxybutynin (DITROPAN) 5 MG tablet Take 5 mg by mouth 2 (two) times daily.    Marland Kitchen oxyCODONE-acetaminophen (PERCOCET) 10-325 MG per tablet Take 1 tablet by mouth every 6 (six) hours as needed for pain. 40 tablet 0  . oxyCODONE-acetaminophen (PERCOCET/ROXICET) 5-325 MG per tablet Take 1-2 tablets by mouth every 6 (six) hours as needed for severe pain. 60 tablet 0  . polyethylene glycol powder (GLYCOLAX/MIRALAX) powder 1 cap full in a full glass of water, once a day for 5 days. 255 g 0  . potassium chloride SA (K-DUR,KLOR-CON) 10 MEQ tablet Take 1 tablet (10 mEq total) by mouth 2 (two) times daily. for 3 days then 1 pill a day (Patient taking differently: Take 10 mEq by mouth 2 (two) times daily. ) 20 tablet 0  . predniSONE (DELTASONE) 20 MG tablet Take 2 tablets (40 mg total) by mouth daily. 10 tablet 0  . psyllium (METAMUCIL) 58.6 % powder Take 1 packet by mouth 2 (two) times daily as needed (for constipation).    . ranitidine (ZANTAC) 150 MG tablet Take 150 mg by mouth 2 (two) times daily.    Marland Kitchen senna (SENOKOT) 8.6 MG tablet Take 2 tablets by mouth 2 (two) times daily as needed for constipation.    . silver sulfADIAZINE  (SILVADENE) 1 % cream Apply 1 application topically 2 (two) times daily. 50 g 2  . simvastatin (ZOCOR) 40 MG tablet Take 40 mg by mouth at bedtime.     . sucralfate (CARAFATE) 1 G tablet Take 1 tablet (1 g total) by mouth 3 (three) times daily. Dissolve tablet in 2-3 tbsp of warm water; swish and swallow. 90 tablet 3  . tiotropium (SPIRIVA) 18 MCG inhalation capsule Place 18 mcg into inhaler and inhale daily.    . traMADol (ULTRAM) 50 MG tablet     . zolpidem (AMBIEN)  5 MG tablet Take 5 mg by mouth at bedtime as needed for sleep.     No current facility-administered medications for this visit.   Facility-Administered Medications Ordered in Other Visits  Medication Dose Route Frequency Provider Last Rate Last Dose  . heparin lock flush 100 unit/mL  500 Units Intravenous Once Lequita Asal, MD      . sodium chloride 0.9 % injection 10 mL  10 mL Intravenous PRN Lequita Asal, MD        Review of Systems:  GENERAL:  Feels "ok".  No fevers or sweats.  Weight up 7 pounds. PERFORMANCE STATUS (ECOG):  2 HEENT:  No visual changes, runny nose, mouth sores or sore throat. Lungs: No shortness of breath or cough.  No hemoptysis. Cardiac:  No chest pain, palpitations, orthopnea, or PND. GI:  Colostomy.  No nausea, vomiting, diarrhea, constipation, melena or hematochezia. GU:  No urgency, frequency, dysuria, or hematuria. Musculoskeletal:  No back pain.  No joint pain.  No muscle tenderness. Extremities:  No pain or swelling. Skin:  Peri-rectal breakdown receiving wound care.  No rashes or skin changes. Neuro:  No headache, numbness or weakness, balance or coordination issues. Endocrine:  No diabetes, thyroid issues, hot flashes or night sweats. Psych:  Recent self commitment.  No mood changes or depression. Pain:  Pain associated with anal mass. Review of systems:  All other systems reviewed and found to be negative.   Physical Exam: Blood pressure 118/73, pulse 88, temperature 96.8 F  (36 C), temperature source Tympanic, resp. rate 18, height 5' 8.5" (1.74 m), weight 177 lb 2.2 oz (80.35 kg).  GENERAL:  Chronically ill appearing woman lying on her side in the exam room in no acute distress. MENTAL STATUS:  Alert and oriented to person, place and time. HEAD:  Long blonde graying hair.  Normocephalic, atraumatic, face symmetric, no Cushingoid features. EYES:  Blue eyes.  Pupils equal round and reactive to light and accomodation.  No conjunctivitis or scleral icterus. ENT: No oral lesions.  Tongue normal. Mucous membranes moist.  RESPIRATORY:  Clear to auscultation without rales, wheezes or rhonchi. CARDIOVASCULAR:  Regular rate and rhythm without murmur, rub or gallop. ABDOMEN:  Soft, non-tender, with active bowel sounds, and no hepatosplenomegaly.  No masses. RECTUM:  Wound 4 x 1.5 cm open with new skin in the periphery.  Surrounding hyperpigmentation. SKIN:  See above.  No other skin changes. EXTREMITIES: No edema, no skin discoloration or tenderness.  No palpable cords. LYMPH NODES: No palpable cervical, supraclavicular, axillary or inguinal adenopathy  NEUROLOGICAL: Unremarkable. PSYCH:  Appropriate.   Appointment on 08/18/2014  Component Date Value Ref Range Status  . WBC 08/18/2014 2.7* 3.6 - 11.0 K/uL Final  . RBC 08/18/2014 3.45* 3.80 - 5.20 MIL/uL Final  . Hemoglobin 08/18/2014 11.2* 12.0 - 16.0 g/dL Final  . HCT 08/18/2014 33.4* 35.0 - 47.0 % Final  . MCV 08/18/2014 96.7  80.0 - 100.0 fL Final  . MCH 08/18/2014 32.5  26.0 - 34.0 pg Final  . MCHC 08/18/2014 33.6  32.0 - 36.0 g/dL Final  . RDW 08/18/2014 20.6* 11.5 - 14.5 % Final  . Platelets 08/18/2014 31* 150 - 440 K/uL Final  . Neutrophils Relative % 08/18/2014 62   Final  . Neutro Abs 08/18/2014 1.7  1.4 - 6.5 K/uL Final  . Lymphocytes Relative 08/18/2014 19   Final  . Lymphs Abs 08/18/2014 0.5* 1.0 - 3.6 K/uL Final  . Monocytes Relative 08/18/2014 15   Final  .  Monocytes Absolute 08/18/2014 0.4  0.2 -  0.9 K/uL Final  . Eosinophils Relative 08/18/2014 3   Final  . Eosinophils Absolute 08/18/2014 0.1  0 - 0.7 K/uL Final  . Basophils Relative 08/18/2014 1   Final  . Basophils Absolute 08/18/2014 0.0  0 - 0.1 K/uL Final    Assessment:  [name] is a 59 y.o. 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  Chest, abdomen, and pelvic CT scan on 05/19/2014 revealed no evidence of metastatic disease.  There was a 4 mm RUL pulmonary nodule.  There were borderline (13 mm) enlarged iliac nodes.  There was a 4.7 x 4.1 x 6.0 cm lower rectal and anal mass consistent with anal cancer.  She underwent diverting colostomy on 05/26/2014.  She has anal leakage.    She is day 19 of cycle #2 concurrent chemotherapy (5FU and mitomycin-C) and radiation (06/29/2014 - 07/31/2014).  She missed 3 days of radiation (07/04-07/06/2014).  She is currently receiving radiation.  She has peri-rectal breakdown and is going to the wound center.  She has mild leukopenia and moderate thrombocytopenia.  Plan: 1.  Labs today:  CBC with diff, CMP. 2.  RTC on 08/15 for CBC with diff. 4.  Review fever and neutropenia precautions. 5.  Contact clinic if any bruising or bleeding.  Avoid aspirin and ibuprofen. 6.  Refill Klonopin. 7.  RTC in 1 week for MD assess and labs (CBC with diff, BMP).   Lequita Asal, MD  08/18/2014

## 2014-08-21 ENCOUNTER — Inpatient Hospital Stay: Payer: Commercial Managed Care - HMO

## 2014-08-21 ENCOUNTER — Ambulatory Visit
Admission: RE | Admit: 2014-08-21 | Discharge: 2014-08-21 | Disposition: A | Discharge status: Home / Self Care | Payer: Commercial Managed Care - HMO | Source: Ambulatory Visit | Attending: Radiation Oncology | Admitting: Radiation Oncology

## 2014-08-21 ENCOUNTER — Encounter: Payer: Self-pay | Admitting: *Deleted

## 2014-08-21 ENCOUNTER — Telehealth: Payer: Self-pay

## 2014-08-21 ENCOUNTER — Ambulatory Visit: Payer: Self-pay | Admitting: *Deleted

## 2014-08-21 DIAGNOSIS — E876 Hypokalemia: Secondary | ICD-10-CM

## 2014-08-21 DIAGNOSIS — C210 Malignant neoplasm of anus, unspecified: Secondary | ICD-10-CM | POA: Diagnosis not present

## 2014-08-21 DIAGNOSIS — Z510 Encounter for antineoplastic radiation therapy: Secondary | ICD-10-CM | POA: Diagnosis not present

## 2014-08-21 LAB — COMPREHENSIVE METABOLIC PANEL
ALT: 14 U/L (ref 14–54)
AST: 23 U/L (ref 15–41)
Albumin: 2.9 g/dL — ABNORMAL LOW (ref 3.5–5.0)
Alkaline Phosphatase: 55 U/L (ref 38–126)
Anion gap: 7 (ref 5–15)
BUN: 6 mg/dL (ref 6–20)
CO2: 27 mmol/L (ref 22–32)
Calcium: 8.4 mg/dL — ABNORMAL LOW (ref 8.9–10.3)
Chloride: 105 mmol/L (ref 101–111)
Creatinine, Ser: 0.64 mg/dL (ref 0.44–1.00)
GFR calc Af Amer: >60 mL/min (ref >60)
GFR calc non Af Amer: >60 mL/min (ref >60)
Glucose, Bld: 104 mg/dL — ABNORMAL HIGH (ref 65–99)
Potassium: 2.9 mmol/L — CL (ref 3.5–5.1)
Sodium: 139 mmol/L (ref 135–145)
Total Bilirubin: 0.5 mg/dL (ref 0.3–1.2)
Total Protein: 6.4 g/dL — ABNORMAL LOW (ref 6.5–8.1)

## 2014-08-21 LAB — CBC WITH DIFFERENTIAL/PLATELET
Basophils Absolute: 0 10*3/uL (ref 0–0.1)
Basophils Relative: 0 %
Eosinophils Absolute: 0.1 10*3/uL (ref 0–0.7)
Eosinophils Relative: 3 %
HCT: 33.3 % — ABNORMAL LOW (ref 35.0–47.0)
Hemoglobin: 11.3 g/dL — ABNORMAL LOW (ref 12.0–16.0)
Lymphocytes Relative: 14 %
Lymphs Abs: 0.6 10*3/uL — ABNORMAL LOW (ref 1.0–3.6)
MCH: 32.5 pg (ref 26.0–34.0)
MCHC: 33.9 g/dL (ref 32.0–36.0)
MCV: 95.8 fL (ref 80.0–100.0)
Monocytes Absolute: 0.3 10*3/uL (ref 0.2–0.9)
Monocytes Relative: 8 %
Neutro Abs: 3.2 10*3/uL (ref 1.4–6.5)
Neutrophils Relative %: 75 %
Platelets: 74 10*3/uL — ABNORMAL LOW (ref 150–440)
RBC: 3.47 MIL/uL — ABNORMAL LOW (ref 3.80–5.20)
RDW: 21.4 % — ABNORMAL HIGH (ref 11.5–14.5)
WBC: 4.3 10*3/uL (ref 3.6–11.0)

## 2014-08-21 LAB — MAGNESIUM: Magnesium: 2 mg/dL (ref 1.7–2.4)

## 2014-08-21 MED ORDER — POTASSIUM CHLORIDE CRYS ER 20 MEQ PO TBCR: 20.0000 meq | EXTENDED_RELEASE_TABLET | Freq: Two times a day (BID) | ORAL | Status: DC

## 2014-08-21 NOTE — Telephone Encounter (Signed)
Called pt left voicemail to please return phone call.   Pt's potassium is 2.9 today.  Pt has already left the clinic and does not answer phone x3 attempts  Pt need to increase potassium from 10 meq twice daily to 20 meq twice daily for a total of 40 meq daily until Thursday see MD Friday per verbal order Dr. Ma Hillock on call.

## 2014-08-21 NOTE — Patient Outreach (Signed)
[facility]) Care Management  08/21/2014  [name] October 28, 1955 [id number]   Documentation note:  Arrived at pt's home today for scheduled home visit  and pt reports to see MD in 30 minutes, need to get ready.   Pt indicated did not remember had an appointment with RN CM today.   As discussed with pt, plan to do home visit tomorrow.       [name]   [place] [facility]  [phone]

## 2014-08-21 NOTE — Telephone Encounter (Signed)
Spoke with patient who repeated instructions to me and then said she is out of potassium and needs it called in to [facility]. Rx E scribed

## 2014-08-22 ENCOUNTER — Inpatient Hospital Stay: Payer: Commercial Managed Care - HMO

## 2014-08-22 ENCOUNTER — Ambulatory Visit
Admission: RE | Admit: 2014-08-22 | Discharge: 2014-08-22 | Disposition: A | Discharge status: Home / Self Care | Payer: Commercial Managed Care - HMO | Source: Ambulatory Visit | Attending: Radiation Oncology | Admitting: Radiation Oncology

## 2014-08-22 ENCOUNTER — Other Ambulatory Visit: Payer: Self-pay | Admitting: *Deleted

## 2014-08-22 ENCOUNTER — Ambulatory Visit: Payer: Commercial Managed Care - HMO | Admitting: *Deleted

## 2014-08-22 DIAGNOSIS — Z510 Encounter for antineoplastic radiation therapy: Secondary | ICD-10-CM | POA: Diagnosis not present

## 2014-08-22 NOTE — Progress Notes (Signed)
  Oncology Nurse Navigator Documentation    Navigator Encounter Type: Other (08/22/14 1600)               Received call from  [facility]. She states home health not covering supplies, called [employer] and they are not covering supplies for colostomy. [name] PSN got [employer] to deliver box of 10 to patient tomorrow since she only has one bag left and will contact her insurance company regarding her supply situation.

## 2014-08-22 NOTE — Patient Outreach (Signed)
Called pt after arriving at her home for scheduled home visit, knocked on door several times - no response.  HIPPA compliant voice message left with contact number.  Plan to call daughter on [facility] consent form.     [name].   [place] [facility]  [phone]

## 2014-08-23 ENCOUNTER — Other Ambulatory Visit: Payer: Self-pay | Admitting: *Deleted

## 2014-08-23 ENCOUNTER — Inpatient Hospital Stay: Payer: Commercial Managed Care - HMO

## 2014-08-23 ENCOUNTER — Ambulatory Visit: Admission: RE | Admit: 2014-08-23 | Payer: Commercial Managed Care - HMO | Source: Ambulatory Visit

## 2014-08-23 ENCOUNTER — Ambulatory Visit: Payer: Commercial Managed Care - HMO

## 2014-08-23 ENCOUNTER — Ambulatory Visit
Admission: RE | Admit: 2014-08-23 | Discharge: 2014-08-23 | Disposition: A | Discharge status: Home / Self Care | Payer: Commercial Managed Care - HMO | Source: Ambulatory Visit | Attending: Radiation Oncology | Admitting: Radiation Oncology

## 2014-08-23 NOTE — Patient Outreach (Addendum)
8/16- received a return phone call from [name] (pt's daughter- on [facility] consent form) in response to voice message left by RN CM. RN CM discussed with daughter pt rescheduled visit for today, no answer to knock on door/phone call.  Daughter states was just with pt this am, will call pt's neighbor to check on pt, get back to RN CM.     [name]   [place] [facility]  [phone]

## 2014-08-23 NOTE — Patient Outreach (Signed)
Telephone call 8/16:   [name] (pt's daughter on [facility] consent form) in an attempt to contact pt.  HIPPA compliant voice message left.   [name]   [place] [facility]  [phone]

## 2014-08-23 NOTE — Patient Outreach (Signed)
Follow up phone call 8/16:  Called pt's daughter April back to see if able to contact pt.  Daughter states left a voice message/sent a text to  Neighbor (one who takes pt to MD appointments).  Daughter reports when she saw pt this am, she was drained ( ? Sleeping).   RN CM informed daughter will be in area short time, if unable to contact pt, will f/u at a later time.    [name]   [place] [facility]  [phone]

## 2014-08-24 ENCOUNTER — Encounter: Payer: Commercial Managed Care - HMO | Admitting: Surgery

## 2014-08-24 ENCOUNTER — Ambulatory Visit: Payer: Commercial Managed Care - HMO

## 2014-08-24 ENCOUNTER — Inpatient Hospital Stay: Payer: Commercial Managed Care - HMO

## 2014-08-24 ENCOUNTER — Other Ambulatory Visit: Payer: Self-pay | Admitting: *Deleted

## 2014-08-24 DIAGNOSIS — L98412 Non-pressure chronic ulcer of buttock with fat layer exposed: Secondary | ICD-10-CM | POA: Diagnosis not present

## 2014-08-24 NOTE — Progress Notes (Addendum)
DEANGELA, RANDLEMAN ([id number]) Visit Report for 08/24/2014 Chief Complaint Document Details Patient Name: [name], [name]. Date of Service: 08/24/2014 11:45 AM Medical Record Number: [id number] Patient Account Number: [id number] Date of Birth/Sex: 17-Aug-1955 (59 y.o. Female) Treating RN: Montey Hora Primary Care Physician: Clayborn Bigness Other Clinician: Referring Physician: Clayborn Bigness Treating Physician/Extender: Frann Rider in Treatment: 1 Information Obtained from: Patient Chief Complaint Patient presents to the wound care center for a consult due non healing wound. 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 Electronic Signature(s) Signed: 08/24/2014 12:34:01 PM By: Christin Fudge MD, FACS Entered By: Christin Fudge on 08/24/2014 12:34:00 [name] ([id number]) -------------------------------------------------------------------------------- HPI Details Patient Name: [name]. Date of Service: 08/24/2014 11:45 AM Medical Record Number: [id number] Patient Account Number: [id number] Date of Birth/Sex: 1955-02-20 (59 y.o. Female) Treating RN: Montey Hora Primary Care Physician: Clayborn Bigness Other Clinician: Referring Physician: Clayborn Bigness Treating Physician/Extender: Frann Rider in Treatment: 1 History of Present Illness Location: anal region and right gluteal area Quality: Patient reports experiencing a sharp pain to affected area(s). Severity: Patient states wound are getting worse. Duration: Patient has had the wound for < 2 weeks prior to presenting for treatment Timing: Pain in wound is constant (hurts all the time) Context: The wound occurred when the patient has been receiving radiation therapy for an anal cancer Modifying Factors: Other treatment(s) tried include:he has been using Silvadene ointment over the wound Associated Signs and Symptoms: Patient reports having difficulty sitting for  long periods. 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 She is receiving cycles of 5-FU and mitomycin-C with radiation. She has been sent to [name] for wound care for addition proctitis and a ulcerated area on the left gluteal region and the perianal vicinity. She tells me that her chemotherapy has been completed last week but she has taken a break from radiation therapy. She is also having some bleeding per rectum and some excoriation of skin around her low back and has significant symptoms of radiation proctitis. Past medical history significant for schizophrenia, asthma, GERD, anxiety depression, bipolar disorder, COPD, PTSD, DVT, peripheral neuropathy, anemia, obstructive sleep apnea. Past surgical history includes recent rectal biopsy, laparoscopic diverting colostomy, hemorrhoidectomy, Port-A-Cath placement. 08/24/2014 -- I spoke to Dr. Lorenda Cahill the medical oncologist on 08/18/14 and she did confirm that the patient has completed her course of chemotherapy and will not be receiving any chemotherapy now or in the near future. Dr. Donella Stade the radiation oncologist is away and I will talk to him on Monday and if he has no objections the patient will probably benefit from hyperbaric oxygen therapy. Radiation therapy had been started on 06/29/2014 and after confirming the present schedule for radiation we can then think about treating the patient with hyperbaric oxygen therapy once it has been discussed with Dr. Donella Stade. On 08/22/2014 I spoke to Dr. Donella Stade who confirmed that the patientos tumor was fairly large and after giving her radiation she has had a resulting open wound from the area where the tumor has shrunk. At this stage he has decided to stop the radiation therapy as he has achieved the desired result and would like me to go ahead with treating her for the late effects  of radiation. Electronic Signature(s) Signed: 08/24/2014 12:34:55 PM By: Christin Fudge MD, FACS Previous Signature: 08/24/2014 12:15:04 PM Version By: Christin Fudge MD, FACS Entered  By: Christin Fudge on 08/24/2014 12:34:55 [name] ([name] ([id number]) -------------------------------------------------------------------------------- Physical Exam Details Patient Name: [name], [name] Date of Service: 08/24/2014 11:45 AM Medical Record Number: [id number] Patient Account Number: [id number] Date of Birth/Sex: 03-29-1955 (59 y.o. Female) Treating RN: Montey Hora Primary Care Physician: Clayborn Bigness Other Clinician: Referring Physician: Clayborn Bigness Treating Physician/Extender: Frann Rider in Treatment: 1 Constitutional . Pulse regular. Respirations normal and unlabored. Afebrile. . Eyes Nonicteric. Reactive to light. Ears, Nose, Mouth, and Throat Lips, teeth, and gums WNL.Marland Kitchen Moist mucosa without lesions . Neck supple and nontender. No palpable supraclavicular or cervical adenopathy. Normal sized without goiter. Respiratory WNL. No retractions.. Cardiovascular Pedal Pulses WNL. No clubbing, cyanosis or edema. Chest Breasts symmetical and no nipple discharge.. Breast tissue WNL, no masses, lumps, or tenderness.. Gastrointestinal (GI) Abdomen without masses or tenderness.. No liver or spleen enlargement or tenderness.. Lymphatic No adneopathy. No adenopathy. No adenopathy. Musculoskeletal Adexa without tenderness or enlargement.. Digits and nails w/o clubbing, cyanosis, infection, petechiae, ischemia, or inflammatory conditions.. Integumentary (Hair, Skin) No suspicious lesions. No crepitus or fluctuance. No peri-wound warmth or erythema. No masses.Marland Kitchen Psychiatric Judgement and insight Intact.. No evidence of depression, anxiety, or agitation.. Notes The patient's ulcerated lesion in the right perianal area continues to be fairly clean but  it is tender to touch. She has an element of radiation proctitis too. Electronic Signature(s) Signed: 08/24/2014 1:06:33 PM By: Christin Fudge MD, FACS Entered By: Christin Fudge on 08/24/2014 13:06:32 [name] ([id number]) -------------------------------------------------------------------------------- Physician Orders Details Patient Name: [name]. Date of Service: 08/24/2014 11:45 AM Medical Record Number: [id number] Patient Account Number: [id number] Date of Birth/Sex: 1955-06-12 (59 y.o. Female) Treating RN: Montey Hora Primary Care Physician: Clayborn Bigness Other Clinician: Referring Physician: Clayborn Bigness Treating Physician/Extender: Frann Rider in Treatment: 1 Verbal / Phone Orders: Yes Clinician: Montey Hora Read Back and Verified: Yes Diagnosis Coding Wound Cleansing Wound #1 Right,Medial Gluteus o Clean wound with Normal Saline. o Cleanse wound with mild soap and water o May Shower, gently pat wound dry prior to applying new dressing. Anesthetic Wound #1 Right,Medial Gluteus o Topical Lidocaine 4% cream applied to wound bed prior to debridement Skin Barriers/Peri-Wound Care Wound #1 Right,Medial Gluteus o Skin Prep Primary Wound Dressing Wound #1 Right,Medial Gluteus o Prisma Ag Secondary Dressing Wound #1 Right,Medial Gluteus o Boardered Foam Dressing o Hydrocolloid - windowpain wound with hyrocolloid to help boardered foam stick better Dressing Change Frequency Wound #1 Right,Medial Gluteus o Change Dressing Monday, Wednesday, Friday - HHRN please add PRN visit this weekend for dressing change Follow-up Appointments Wound #1 Right,Medial Gluteus o Return Appointment in 1 week. Home Health Wound #1 Right,Medial Gluteus [name], FURUYA ([id number]) o [place] for wound care - care giver Janus Vlcek - confirmed [facility] please add PRN visit this weekend for dressing  change o [place] Nurse may visit PRN to address patientos wound care needs. o FACE TO FACE ENCOUNTER: MEDICARE and MEDICAID PATIENTS: I certify that this patient is under my care and that I had a face-to-face encounter that meets the physician face-to-face encounter requirements with this patient on this date. The encounter with the patient was in whole or in part for the following MEDICAL CONDITION: (primary reason for [place]) MEDICAL NECESSITY: I certify, that based on my findings, NURSING services are a medically necessary home health service. HOME BOUND STATUS: I certify that my clinical findings support that this patient is homebound (i.e.,  Due to illness or injury, pt requires aid of supportive devices such as crutches, cane, wheelchairs, walkers, the use of special transportation or the assistance of another person to leave their place of residence. There is a normal inability to leave the home and doing so requires considerable and taxing effort. Other absences are for medical reasons / religious services and are infrequent or of short duration when for other reasons). o If current dressing causes regression in wound condition, may D/C ordered dressing product/s and apply Normal Saline Moist Dressing daily until next [place] / Other MD appointment. [place] of regression in wound condition at [phone]. - Please provide patient with appropriate wound care supplies or assist her with ordering supplies from [facility] at [phone] or [phone] o Please direct any NON-WOUND related issues/requests for orders to patient's Primary Care Physician Hyperbaric Oxygen Therapy Wound #1 Right,Medial Gluteus o Evaluate for HBO Therapy o Indication: - radiation Radiology o X-ray, Chest oooo Notes EKG Electronic Signature(s) Signed: 08/25/2014 3:42:06 PM By: Montey Hora Signed: 08/25/2014 4:25:01 PM By: Christin Fudge MD,  FACS Previous Signature: 08/25/2014 9:13:00 AM Version By: Montey Hora Previous Signature: 08/25/2014 9:09:29 AM Version By: Montey Hora Previous Signature: 08/24/2014 12:53:51 PM Version By: Montey Hora Previous Signature: 08/24/2014 1:56:54 PM Version By: Christin Fudge MD, FACS Entered By: Montey Hora on 08/25/2014 15:42:05 SHEELAH, RITACCO ([id number]) [name], [name] ([id number]) -------------------------------------------------------------------------------- Problem List Details Patient Name: Broaden, Carmencita P. Date of Service: 08/24/2014 11:45 AM Medical Record Number: [id number] Patient Account Number: [id number] Date of Birth/Sex: [birth year] (59 y.o. 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 F17.218 Nicotine dependence, cigarettes, with other nicotine- 08/17/2014 Yes induced disorders L59.9 Disorder of the skin and subcutaneous tissue related to 08/24/2014 Yes radiation, unspecified Inactive Problems Resolved Problems Electronic Signature(s) Signed: 08/24/2014 1:13:03 PM By: Christin Fudge MD, FACS Previous Signature: 08/24/2014 12:33:53 PM Version By: Christin Fudge MD, FACS Entered By: Christin Fudge on 08/24/2014 13:13:03 Razzano, [name] ([id number]) -------------------------------------------------------------------------------- Progress Note Details Patient Name: [name] Date of Service: 08/24/2014 11:45 AM Medical Record Number: [id number] Patient Account Number: [id number] Date of Birth/Sex: Jun 17, 1955 (59 y.o. Female) Treating RN: Montey Hora Primary Care Physician: Clayborn Bigness Other Clinician: Referring Physician: Clayborn Bigness Treating Physician/Extender: Frann Rider in Treatment: 1 Subjective Chief Complaint Information obtained from Patient Patient presents to the wound care center for a consult due non healing wound. 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 History of Present Illness (HPI) The following HPI elements were documented for the patient's wound: Location: anal region and right gluteal area Quality: Patient reports experiencing a sharp pain to affected area(s). Severity: Patient states wound are getting worse. Duration: Patient has had the wound for < 2 weeks prior to presenting for treatment Timing: Pain in wound is constant (hurts all the time) Context: The wound occurred when the patient has been receiving radiation therapy for an anal cancer Modifying Factors: Other treatment(s) tried include:he has been using Silvadene ointment over the wound Associated Signs and Symptoms: Patient reports having difficulty sitting for long periods. 59 year old patient who has been treated for stage  II squamous cell carcinoma of the anus and has been seeing medical oncology regularly. She is receiving cycles of 5-FU and mitomycin-C with radiation. She has been sent to [name] for wound care for addition proctitis and a ulcerated area on the left gluteal region and the perianal vicinity. She tells me that her chemotherapy has been completed last week but she has taken a break from radiation therapy. She is also having some bleeding per rectum and some excoriation of skin around her low back and has significant symptoms of radiation proctitis. Past medical history significant for schizophrenia, asthma, GERD, anxiety depression, bipolar disorder, COPD, PTSD, DVT, peripheral neuropathy, anemia, obstructive sleep apnea. Past surgical history includes recent rectal biopsy,  laparoscopic diverting colostomy, hemorrhoidectomy, Port-A-Cath placement. 08/24/2014 -- I spoke to Dr. Lorenda Cahill the medical oncologist on 08/18/14 and she did confirm that the patient has completed her course of chemotherapy and will not be receiving any chemotherapy now or in the near future. Dr. Donella Stade the radiation oncologist is away and I will talk to him on Monday and if he has no objections the patient will probably benefit from hyperbaric oxygen therapy. Radiation therapy had been started on 06/29/2014 and after confirming the present schedule for radiation we can then think about treating the patient with hyperbaric oxygen therapy once it has been discussed with Dr. Donella Stade. On 08/22/2014 I spoke to Dr. Donella Stade who confirmed that the patient s tumor was fairly large and after giving her radiation she has had a resulting open wound from the area where the tumor has shrunk. At this stage he has decided to stop the radiation therapy as he has achieved the desired result and would like me to go [name], JANICKI. ([id number]) ahead with treating her for the late effects of radiation. Objective Constitutional Pulse regular. Respirations normal and unlabored. Afebrile. Vitals Time Taken: 12:15 PM, Height: 68 in, Weight: 183 lbs, BMI: 27.8, Temperature: 98.8 F, Pulse: 102 bpm, Respiratory Rate: 18 breaths/min, Blood Pressure: 114/68 mmHg. Eyes Nonicteric. Reactive to light. Ears, Nose, Mouth, and Throat Lips, teeth, and gums WNL.Marland Kitchen Moist mucosa without lesions . Neck supple and nontender. No palpable supraclavicular or cervical adenopathy. Normal sized without goiter. Respiratory WNL. No retractions.. Cardiovascular Pedal Pulses WNL. No clubbing, cyanosis or edema. Chest Breasts symmetical and no nipple discharge.. Breast tissue WNL, no masses, lumps, or tenderness.. Gastrointestinal (GI) Abdomen without masses or tenderness.. No liver or spleen enlargement or  tenderness.. Lymphatic No adneopathy. No adenopathy. No adenopathy. Musculoskeletal Adexa without tenderness or enlargement.. Digits and nails w/o clubbing, cyanosis, infection, petechiae, ischemia, or inflammatory conditions.Marland Kitchen Psychiatric Judgement and insight Intact.. No evidence of depression, anxiety, or agitation.[name], [name] ([id number]) General Notes: The patient's ulcerated lesion in the right perianal area continues to be fairly clean but it is tender to touch. She has an element of radiation proctitis too. Integumentary (Hair, Skin) No suspicious lesions. No crepitus or fluctuance. No peri-wound warmth or erythema. No masses.. Wound #1 status is Open. Original cause of wound was Radiation Burn. The wound is located on the Right,Medial Gluteus. The wound measures 1.8cm length x 2.9cm width x 0.2cm depth; 4.1cm^2 area and 0.82cm^3 volume. The wound is limited to skin breakdown. There is no tunneling or undermining noted. There is a medium amount of serosanguineous drainage noted. The wound margin is distinct with the outline attached to the wound base. There is large (67-100%) red granulation within the wound bed. There is a small (1-33%) amount of necrotic tissue within  the wound bed including Adherent Slough. The periwound skin appearance exhibited: Moist. The periwound skin appearance did not exhibit: Callus, Crepitus, Excoriation, Fluctuance, Friable, Induration, Localized Edema, Rash, Scarring, Dry/Scaly, Maceration, Atrophie Blanche, Cyanosis, Ecchymosis, Hemosiderin Staining, Mottled, Pallor, Rubor, Erythema. Periwound temperature was noted as No Abnormality. The periwound has tenderness on palpation. 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 She has significant late effects of radiation and will benefit immensely from hyperbaric oxygen therapy. after discussing with her medical oncologist and her radiation oncologist I have recommended that she receive hyperbaric oxygen therapy to aid in her recovery. After discussing the risks benefits and alternatives she is agreeable and we will proceed with getting a workup done and applying for insurance clearance. She will commence hyperbaric oxygen therapy as soon as possible. Plan [name], MULA. ([id number]) Wound Cleansing: Wound #1 Right,Medial Gluteus: Clean wound with Normal Saline. Cleanse wound with mild soap and water May Shower, gently pat wound dry prior to applying new dressing. Anesthetic: Wound #1 Right,Medial Gluteus: Topical Lidocaine 4% cream applied to wound bed prior to debridement Skin Barriers/Peri-Wound Care: Wound #1 Right,Medial Gluteus: Skin Prep Primary Wound Dressing: Wound #1 Right,Medial Gluteus: Prisma Ag Secondary Dressing: Wound #1 Right,Medial Gluteus: Boardered Foam Dressing Hydrocolloid - windowpain wound with hyrocolloid to help boardered foam stick better Dressing Change Frequency: Wound #1 Right,Medial Gluteus: Change Dressing Monday, Wednesday, Friday - HHRN please add PRN visit this weekend for dressing change Follow-up Appointments: Wound #1 Right,Medial Gluteus: Return Appointment in 1 week. Home Health: Wound #1 Right,Medial Gluteus: [place] for wound care - care giver Chaniqua Brisby - confirmed [facility] please add PRN visit this weekend for dressing change Home Health Nurse may visit PRN to address patient s wound care  needs. FACE TO FACE ENCOUNTER: MEDICARE and MEDICAID PATIENTS: I certify that this patient is under my care and that I had a face-to-face encounter that meets the physician face-to-face encounter requirements with this patient on this date. The encounter with the patient was in whole or in part for the following MEDICAL CONDITION: (primary reason for [place]) MEDICAL NECESSITY: I certify, that based on my findings, NURSING services are a medically necessary home health service. HOME BOUND STATUS: I certify that my clinical findings support that this patient is homebound (i.e., Due to illness or injury, pt requires aid of supportive devices such as crutches, cane, wheelchairs, walkers, the use of special transportation or the assistance of another person to leave their place of residence. There is a normal inability to leave the home and doing so requires considerable and taxing effort. Other absences are for medical reasons / religious services and are infrequent or of short duration when for other reasons). If current dressing causes regression in wound condition, may D/C ordered dressing product/s and apply Normal Saline Moist Dressing daily until next [place] / Other MD appointment. [place] of regression in wound condition at [phone]. - Please provide patient with appropriate wound care supplies or assist her with ordering supplies from [facility] at [phone] or [phone] Please direct any NON-WOUND related issues/requests for orders to patient's Primary Care Physician Hyperbaric Oxygen Therapy: Wound #1 Right,Medial Gluteus: Evaluate for HBO Therapy Indication: - radiation [name], [name] ([id number]) Radiology ordered were: X-ray, Chest General Notes: EKG The patient who has a squamous cell carcinoma of the anus has received chemotherapy and radiation and now is having problems with a open perineal wound with radiation  proctitis. She has significant late effects of radiation and will benefit immensely from hyperbaric oxygen therapy. after discussing with her medical oncologist and her radiation oncologist I have recommended that she receive hyperbaric oxygen therapy to aid in her recovery. After discussing the risks benefits and alternatives she is agreeable and we will proceed with getting a workup done and applying for insurance clearance. She will commence hyperbaric oxygen therapy as soon as possible. Electronic Signature(s) Signed: 08/28/2014 8:28:35 AM By: Christin Fudge MD, FACS Previous Signature: 08/24/2014 1:13:18 PM Version By: Christin Fudge MD, FACS Previous Signature: 08/24/2014 1:08:32 PM Version By: Christin Fudge MD, FACS Entered By: Christin Fudge on 08/28/2014 08:24:36 Manzer, [name] ([id number]) -------------------------------------------------------------------------------- SuperBill Details Patient Name: [name] Date of Service: 08/24/2014 Medical Record Number: [id number] Patient Account Number: [id number] Date of Birth/Sex: [birth year] (59 y.o. Female) Treating RN: Montey Hora Primary Care Physician: Clayborn Bigness Other Clinician: Referring Physician: Clayborn Bigness Treating Physician/Extender: Frann Rider in Treatment: 1 Diagnosis Coding ICD-10 Codes Code Description K62.7 Radiation proctitis C44.520 Squamous cell carcinoma of anal skin L98.412 Non-pressure chronic ulcer of buttock with fat layer exposed F17.218 Nicotine dependence, cigarettes, with other nicotine-induced disorders L59.9 Disorder of the skin and subcutaneous tissue related to radiation, unspecified Facility Procedures CPT4 Code: [id number] Description: 99213 - WOUND CARE VISIT-LEV 3 EST PT Modifier: Quantity: 1 Physician Procedures CPT4: Description Modifier Quantity Code [id number] [id number] - WC PHYS LEVEL 3 - EST PT 1 ICD-10 Description Diagnosis K62.7 Radiation proctitis C44.520 Squamous cell  carcinoma of anal skin L59.9 Disorder of the skin and subcutaneous tissue related to  radiation, unspecified L98.412 Non-pressure chronic ulcer of buttock with fat layer exposed Electronic Signature(s) Signed: 08/24/2014 2:07:45 PM By: Montey Hora Signed: 08/24/2014 4:25:28 PM By: Christin Fudge MD, FACS Previous Signature: 08/24/2014 1:12:40 PM Version By: Christin Fudge MD, FACS Entered By: Montey Hora on 08/24/2014 14:07:45

## 2014-08-25 ENCOUNTER — Inpatient Hospital Stay: Payer: Commercial Managed Care - HMO

## 2014-08-25 ENCOUNTER — Inpatient Hospital Stay (HOSPITAL_BASED_OUTPATIENT_CLINIC_OR_DEPARTMENT_OTHER): Payer: Commercial Managed Care - HMO | Admitting: Hematology and Oncology

## 2014-08-25 ENCOUNTER — Inpatient Hospital Stay: Payer: Commercial Managed Care - HMO | Admitting: Hematology and Oncology

## 2014-08-25 ENCOUNTER — Ambulatory Visit: Payer: Commercial Managed Care - HMO

## 2014-08-25 ENCOUNTER — Telehealth: Payer: Self-pay

## 2014-08-25 VITALS — BP 102/65 | HR 105 | Temp 99.2°F | Wt 185.2 lb

## 2014-08-25 DIAGNOSIS — C210 Malignant neoplasm of anus, unspecified: Secondary | ICD-10-CM

## 2014-08-25 DIAGNOSIS — D72819 Decreased white blood cell count, unspecified: Secondary | ICD-10-CM

## 2014-08-25 DIAGNOSIS — J449 Chronic obstructive pulmonary disease, unspecified: Secondary | ICD-10-CM

## 2014-08-25 DIAGNOSIS — M4800 Spinal stenosis, site unspecified: Secondary | ICD-10-CM

## 2014-08-25 DIAGNOSIS — E041 Nontoxic single thyroid nodule: Secondary | ICD-10-CM

## 2014-08-25 DIAGNOSIS — Z8701 Personal history of pneumonia (recurrent): Secondary | ICD-10-CM

## 2014-08-25 DIAGNOSIS — F4310 Post-traumatic stress disorder, unspecified: Secondary | ICD-10-CM

## 2014-08-25 DIAGNOSIS — G4730 Sleep apnea, unspecified: Secondary | ICD-10-CM

## 2014-08-25 DIAGNOSIS — F329 Major depressive disorder, single episode, unspecified: Secondary | ICD-10-CM

## 2014-08-25 DIAGNOSIS — Z79899 Other long term (current) drug therapy: Secondary | ICD-10-CM

## 2014-08-25 DIAGNOSIS — F419 Anxiety disorder, unspecified: Secondary | ICD-10-CM

## 2014-08-25 DIAGNOSIS — R0602 Shortness of breath: Secondary | ICD-10-CM

## 2014-08-25 DIAGNOSIS — M797 Fibromyalgia: Secondary | ICD-10-CM

## 2014-08-25 DIAGNOSIS — J45909 Unspecified asthma, uncomplicated: Secondary | ICD-10-CM

## 2014-08-25 DIAGNOSIS — D696 Thrombocytopenia, unspecified: Secondary | ICD-10-CM | POA: Diagnosis not present

## 2014-08-25 DIAGNOSIS — E876 Hypokalemia: Secondary | ICD-10-CM

## 2014-08-25 DIAGNOSIS — I10 Essential (primary) hypertension: Secondary | ICD-10-CM

## 2014-08-25 DIAGNOSIS — F319 Bipolar disorder, unspecified: Secondary | ICD-10-CM

## 2014-08-25 DIAGNOSIS — G629 Polyneuropathy, unspecified: Secondary | ICD-10-CM

## 2014-08-25 DIAGNOSIS — F209 Schizophrenia, unspecified: Secondary | ICD-10-CM

## 2014-08-25 DIAGNOSIS — K219 Gastro-esophageal reflux disease without esophagitis: Secondary | ICD-10-CM

## 2014-08-25 DIAGNOSIS — F17210 Nicotine dependence, cigarettes, uncomplicated: Secondary | ICD-10-CM

## 2014-08-25 DIAGNOSIS — Z86718 Personal history of other venous thrombosis and embolism: Secondary | ICD-10-CM

## 2014-08-25 DIAGNOSIS — M5136 Other intervertebral disc degeneration, lumbar region: Secondary | ICD-10-CM

## 2014-08-25 DIAGNOSIS — E039 Hypothyroidism, unspecified: Secondary | ICD-10-CM

## 2014-08-25 LAB — COMPREHENSIVE METABOLIC PANEL
ALT: 11 U/L — ABNORMAL LOW (ref 14–54)
AST: 23 U/L (ref 15–41)
Albumin: 2.6 g/dL — ABNORMAL LOW (ref 3.5–5.0)
Alkaline Phosphatase: 55 U/L (ref 38–126)
Anion gap: 3 — ABNORMAL LOW (ref 5–15)
BUN: 6 mg/dL (ref 6–20)
CO2: 26 mmol/L (ref 22–32)
Calcium: 7.7 mg/dL — ABNORMAL LOW (ref 8.9–10.3)
Chloride: 104 mmol/L (ref 101–111)
Creatinine, Ser: 0.63 mg/dL (ref 0.44–1.00)
GFR calc Af Amer: >60 mL/min (ref >60)
GFR calc non Af Amer: >60 mL/min (ref >60)
Glucose, Bld: 106 mg/dL — ABNORMAL HIGH (ref 65–99)
Potassium: 2.8 mmol/L — CL (ref 3.5–5.1)
Sodium: 133 mmol/L — ABNORMAL LOW (ref 135–145)
Total Bilirubin: 0.4 mg/dL (ref 0.3–1.2)
Total Protein: 6 g/dL — ABNORMAL LOW (ref 6.5–8.1)

## 2014-08-25 LAB — CBC WITH DIFFERENTIAL/PLATELET
Basophils Absolute: 0 10*3/uL (ref 0–0.1)
Basophils Relative: 0 %
Eosinophils Absolute: 0.1 10*3/uL (ref 0–0.7)
Eosinophils Relative: 2 %
HCT: 30.6 % — ABNORMAL LOW (ref 35.0–47.0)
Hemoglobin: 10.5 g/dL — ABNORMAL LOW (ref 12.0–16.0)
Lymphocytes Relative: 12 %
Lymphs Abs: 0.5 10*3/uL — ABNORMAL LOW (ref 1.0–3.6)
MCH: 33.2 pg (ref 26.0–34.0)
MCHC: 34.1 g/dL (ref 32.0–36.0)
MCV: 97.2 fL (ref 80.0–100.0)
Monocytes Absolute: 0.2 10*3/uL (ref 0.2–0.9)
Monocytes Relative: 6 %
Neutro Abs: 3.4 10*3/uL (ref 1.4–6.5)
Neutrophils Relative %: 80 %
Platelets: 114 10*3/uL — ABNORMAL LOW (ref 150–440)
RBC: 3.15 MIL/uL — ABNORMAL LOW (ref 3.80–5.20)
RDW: 22.2 % — ABNORMAL HIGH (ref 11.5–14.5)
WBC: 4.3 10*3/uL (ref 3.6–11.0)

## 2014-08-25 MED ORDER — POTASSIUM CHLORIDE 10 MEQ/100ML IV SOLN: 10.0000 meq | INTRAVENOUS | Status: DC

## 2014-08-25 MED ORDER — SODIUM CHLORIDE 0.9 % IV SOLN
20.0000 meq | Freq: Once | INTRAVENOUS | Status: DC
  Filled 2014-08-25: qty 10

## 2014-08-25 MED ORDER — SODIUM CHLORIDE 0.9 % IJ SOLN
10.0000 mL | Freq: Once | INTRAMUSCULAR | Status: AC
  Administered 2014-08-25: 10 mL via INTRAVENOUS
  Filled 2014-08-25: qty 10

## 2014-08-25 MED ORDER — SODIUM CHLORIDE 0.9 % IV SOLN
20.0000 meq | Freq: Once | INTRAVENOUS | Status: AC
  Administered 2014-08-25: 20 meq via INTRAVENOUS
  Filled 2014-08-25: qty 10

## 2014-08-25 MED ORDER — HEPARIN SOD (PORK) LOCK FLUSH 100 UNIT/ML IV SOLN
500.0000 [IU] | Freq: Once | INTRAVENOUS | Status: AC
  Administered 2014-08-25: 500 [IU] via INTRAVENOUS

## 2014-08-25 NOTE — Progress Notes (Signed)
[name] day:  08/25/2014   Chief Complaint: [name] is a 59 y.o. 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  HPI:  The patient was last seen in the medical oncology [name] on 08/18/2014.  At that time, she was day 19 of cycle #2 5FU and mitomycin-C with radiation.  Clinically she was doing well.  She was being followed by the wound care [name] for perirectal breakdown.  Platelet count was 31,000.  Follow-up platelet count was 74,000 on 08/21/2014.  During the interim, she states that her last radiation was on 08/17/2014. He is off radiation for 2 weeks secondary to her perirectal wound breakdown. She continues to go to wound care. She has not been able to pick up her potassium.  Past Medical History  Diagnosis Date  . Schizophrenia   . Asthma   . GERD (gastroesophageal reflux disease)   . Anxiety   . Depression   . Bipolar disorder   . COPD (chronic obstructive pulmonary disease)   . Occasional tremors     right hand  . PTSD (post-traumatic stress disorder)   . Shortness of breath dyspnea   . Fibromyalgia   . DVT (deep venous thrombosis) 2011    RUE  . Thyroid nodule   . DDD (degenerative disc disease), lumbar   . Spinal stenosis   . Peripheral neuropathy   . Rotator cuff tear     right  . Pneumonia 2011  . Hypothyroidism     no meds currently  . Anemia     during pregnancy only  . Hypertension     Off meds x 15 years-well controlled now per pt  . Squamous cell cancer, anus   . Severe obstructive sleep apnea 06/27/2014    Past Surgical History  Procedure Laterality Date  . Foot surgery Right   . Tubal ligation    . Eye surgery Bilateral   . Mouth surgery  2002  . Rectal biopsy N/A 05/08/2014    Procedure: BIOPSY RECTAL;  Surgeon: Marlyce Huge, MD;  Location: [facility];  Service: General;   Laterality: N/A;  . Evaluation under anesthesia with hemorrhoidectomy N/A 05/08/2014    Procedure: EXAM UNDER ANESTHESIA WITH HEMORRHOIDECTOMY;  Surgeon: Marlyce Huge, MD;  Location: [facility];  Service: General;  Laterality: N/A;  . Portacath placement N/A 05/20/2014    Procedure: INSERTION PORT-A-CATH;  Surgeon: Florene Glen, MD;  Location: [facility];  Service: General;  Laterality: N/A;  . Laparoscopic diverted colostomy N/A 05/26/2014    Procedure: LAPAROSCOPIC DIVERTED COLOSTOMY;  Surgeon: Marlyce Huge, MD;  Location: [facility] OR[facility];  Service: General;  Laterality: N/A;    Family History  Problem Relation Age of Onset  . Cancer Maternal Aunt   . Cancer Paternal Uncle     Social History:  reports that she has been smoking Cigarettes.  She has a 11 pack-year smoking history. She has never used smokeless tobacco. She reports that she drinks alcohol. She reports that she does not use illicit drugs.  The patient is alone today.  Allergies:  Allergies  Allergen Reactions  . Sulfa Antibiotics Hives    Current Medications: Current Outpatient Prescriptions  Medication Sig Dispense Refill  . albuterol (PROVENTIL HFA;VENTOLIN HFA) 108 (90 BASE) MCG/ACT inhaler Inhale 1 puff into the lungs every 6 (six) hours as needed for wheezing or  shortness of breath.    Marland Kitchen albuterol (PROVENTIL) (2.5 MG/3ML) 0.083% nebulizer solution Inhale 3 mLs (2.5 mg total) into the lungs every 6 (six) hours as needed for wheezing or shortness of breath. 75 mL 12  . Alum & Mag Hydroxide-Simeth (MAGIC MOUTHWASH) SOLN Take 5 mLs by mouth 4 (four) times daily. 480 mL 3  . budesonide-formoterol (SYMBICORT) 160-4.5 MCG/ACT inhaler Inhale 2 puffs into the lungs 2 (two) times daily. 1 Inhaler 12  . Calcium Carb-Cholecalciferol (CALCIUM 600+D) 600-800 MG-UNIT TABS Take 1 tablet by mouth 2 (two) times daily.     . clindamycin (CLEOCIN) 300 MG capsule Take 300 mg by mouth 3 (three) times daily.    . clonazePAM  (KLONOPIN) 1 MG tablet Take 1 tablet (1 mg total) by mouth 3 (three) times daily as needed for anxiety. 42 tablet 0  . docusate sodium (COLACE) 100 MG capsule Take 2 capsules (200 mg total) by mouth 2 (two) times daily. (Patient taking differently: Take 200 mg by mouth 2 (two) times daily as needed for mild constipation. ) 120 capsule 0  . DULoxetine (CYMBALTA) 60 MG capsule Take 60 mg by mouth every evening.    . escitalopram (LEXAPRO) 20 MG tablet Take 20 mg by mouth at bedtime.    Marland Kitchen etodolac (LODINE) 500 MG tablet Take 500 mg by mouth 2 (two) times daily.    Marland Kitchen lidocaine (XYLOCAINE) 2 % jelly Apply 1 application topically as needed (for pain).    . ondansetron (ZOFRAN) 8 MG tablet Take 1 tablet (8 mg total) by mouth 2 (two) times daily. Start the day after chemo for 2 days. Then take as needed for nausea or vomiting. 30 tablet 1  . oxybutynin (DITROPAN) 5 MG tablet Take 5 mg by mouth 2 (two) times daily.    Marland Kitchen oxyCODONE-acetaminophen (PERCOCET) 10-325 MG per tablet Take 1 tablet by mouth every 6 (six) hours as needed for pain. 40 tablet 0  . oxyCODONE-acetaminophen (PERCOCET/ROXICET) 5-325 MG per tablet Take 1-2 tablets by mouth every 6 (six) hours as needed for severe pain. 60 tablet 0  . polyethylene glycol powder (GLYCOLAX/MIRALAX) powder 1 cap full in a full glass of water, once a day for 5 days. 255 g 0  . potassium chloride SA (K-DUR,KLOR-CON) 20 MEQ tablet Take 1 tablet (20 mEq total) by mouth 2 (two) times daily. Through Thursday then  See md on Friday 14 tablet 0  . predniSONE (DELTASONE) 20 MG tablet Take 2 tablets (40 mg total) by mouth daily. 10 tablet 0  . psyllium (METAMUCIL) 58.6 % powder Take 1 packet by mouth 2 (two) times daily as needed (for constipation).    . ranitidine (ZANTAC) 150 MG tablet Take 150 mg by mouth 2 (two) times daily.    Marland Kitchen senna (SENOKOT) 8.6 MG tablet Take 2 tablets by mouth 2 (two) times daily as needed for constipation.    . silver sulfADIAZINE (SILVADENE) 1 %  cream Apply 1 application topically 2 (two) times daily. 50 g 2  . simvastatin (ZOCOR) 40 MG tablet Take 40 mg by mouth at bedtime.     . sucralfate (CARAFATE) 1 G tablet Take 1 tablet (1 g total) by mouth 3 (three) times daily. Dissolve tablet in 2-3 tbsp of warm water; swish and swallow. 90 tablet 3  . tiotropium (SPIRIVA) 18 MCG inhalation capsule Place 18 mcg into inhaler and inhale daily.    . traMADol (ULTRAM) 50 MG tablet     . zolpidem (AMBIEN) 5 MG  tablet Take 5 mg by mouth at bedtime as needed for sleep.    Marland Kitchen KLOR-CON M10 10 MEQ tablet      Current Facility-Administered Medications  Medication Dose Route Frequency Provider Last Rate Last Dose  . sodium chloride 0.9 % 100 mL with potassium chloride 20 mEq infusion  20 mEq Intravenous Once Lequita Asal, MD       Facility-Administered Medications Ordered in Other Visits  Medication Dose Route Frequency Provider Last Rate Last Dose  . heparin lock flush 100 unit/mL  500 Units Intravenous Once Lequita Asal, MD      . sodium chloride 0.9 % injection 10 mL  10 mL Intravenous PRN Lequita Asal, MD        Review of Systems:  GENERAL:  Feels "ok".  No fevers or sweats.  Weight up. PERFORMANCE STATUS (ECOG):  2 HEENT:  No visual changes, runny nose, mouth sores or sore throat. Lungs: No shortness of breath or cough.  No hemoptysis. Cardiac:  No chest pain, palpitations, orthopnea, or PND. GI:  Colostomy.  No nausea, vomiting, diarrhea, constipation, melena or hematochezia. GU:  No urgency, frequency, dysuria, or hematuria. Musculoskeletal:  No back pain.  No joint pain.  No muscle tenderness. Extremities:  No pain or swelling. Skin:  Peri-rectal breakdown causing delays in radiation (on hold x 2 weeks). Neuro:  No headache, numbness or weakness, balance or coordination issues. Endocrine:  No diabetes, thyroid issues, hot flashes or night sweats. Psych:  No mood changes or depression.  Anxiety improved since start of  treatment. Pain:  Minimal pain associated with anal mass. Review of systems:  All other systems reviewed and found to be negative.   Physical Exam: Blood pressure 102/65, pulse 105, temperature 99.2 F (37.3 C), temperature source Tympanic, weight 185 lb 3 oz (84 kg).  GENERAL:  Chronically ill appearing woman lying on her side in the exam room in no acute distress.  Room has a foul odor. MENTAL STATUS:  Alert and oriented to person, place and time. HEAD:  Pearline Cables hair.  Normocephalic, atraumatic, face symmetric, no Cushingoid features. EYES:  Blue eyes.  Pupils equal round and reactive to light and accomodation.  No conjunctivitis or scleral icterus. ENT: No oral lesions.  Tongue normal. Mucous membranes moist.  RESPIRATORY:  Clear to auscultation without rales, wheezes or rhonchi. CARDIOVASCULAR:  Regular rate and rhythm without murmur, rub or gallop. ABDOMEN:  Soft, non-tender, with active bowel sounds, and no hepatosplenomegaly.  No masses. RECTUM:  Wound is packed. SKIN:  No rashes, ulcers or lesions. EXTREMITIES: No edema, no skin discoloration or tenderness.  No palpable cords. LYMPH NODES: No palpable cervical, supraclavicular, axillary or inguinal adenopathy  NEUROLOGICAL: Unremarkable. PSYCH:  Appropriate.   Appointment on 08/25/2014  Component Date Value Ref Range Status  . WBC 08/25/2014 4.3  3.6 - 11.0 K/uL Final  . RBC 08/25/2014 3.15* 3.80 - 5.20 MIL/uL Final  . Hemoglobin 08/25/2014 10.5* 12.0 - 16.0 g/dL Final  . HCT 08/25/2014 30.6* 35.0 - 47.0 % Final  . MCV 08/25/2014 97.2  80.0 - 100.0 fL Final  . MCH 08/25/2014 33.2  26.0 - 34.0 pg Final  . MCHC 08/25/2014 34.1  32.0 - 36.0 g/dL Final  . RDW 08/25/2014 22.2* 11.5 - 14.5 % Final  . Platelets 08/25/2014 114* 150 - 440 K/uL Final  . Neutrophils Relative % 08/25/2014 80   Final  . Neutro Abs 08/25/2014 3.4  1.4 - 6.5 K/uL Final  . Lymphocytes  Relative 08/25/2014 12   Final  . Lymphs Abs 08/25/2014 0.5* 1.0 - 3.6  K/uL Final  . Monocytes Relative 08/25/2014 6   Final  . Monocytes Absolute 08/25/2014 0.2  0.2 - 0.9 K/uL Final  . Eosinophils Relative 08/25/2014 2   Final  . Eosinophils Absolute 08/25/2014 0.1  0 - 0.7 K/uL Final  . Basophils Relative 08/25/2014 0   Final  . Basophils Absolute 08/25/2014 0.0  0 - 0.1 K/uL Final    Assessment:  CAIYA BETTES is a 59 y.o. 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  Chest, abdomen, and pelvic CT scan on 05/19/2014 revealed no evidence of metastatic disease.  There was a 4 mm RUL pulmonary nodule.  There were borderline (13 mm) enlarged iliac nodes.  There was a 4.7 x 4.1 x 6.0 cm lower rectal and anal mass consistent with anal cancer.  She underwent diverting colostomy on 05/26/2014.  She has anal leakage.    She is currently day 26 of cycle #2 concurrent chemotherapy (5FU and mitomycin-C) and radiation (06/29/2014 - 07/31/2014).  She missed 3 days of radiation (07/04-07/06/2014).  Cycle #2 was complicated by wound breakdown and moderate thrombocytopenia (31,000). Platelets are recovering.   She is currently on a 2 week break from radiation secondary to breakdown. She is being seen at the wound care center. She is being considered for hyperbaric oxygen.  She has hypokalemia.  Plan: 1.  Labs today:  CBC with diff, CMP. 2.  Refill potassium today. 3.  RTC on 08/24 for BMP. 4.  RTC in 2 weeks for MD assess and labs (BMP and Mg).   Lequita Asal, MD  08/25/2014, 2:28 PM

## 2014-08-25 NOTE — Progress Notes (Signed)
Patient being seen by Wound Care for her anal cancer.  Patient cannot sit - has to lie down.  States she has not picked up prescription for K+ due to transportation problems.

## 2014-08-25 NOTE — Progress Notes (Signed)
[name] ([id number]) Visit Report for 08/24/2014 Arrival Information Details Patient Name: Tracy, [name]. Date of Service: 08/24/2014 11:45 AM Medical Record Number: [id number] Patient Account Number: [id number] Date of Birth/Sex: 09-Jun-1955 (59 y.o. Female) Treating RN: Montey Hora Primary Care Physician: Clayborn Bigness Other Clinician: Referring Physician: Clayborn Bigness Treating Physician/Extender: Frann Rider in Treatment: 1 Visit Information History Since Last Visit Added or deleted any medications: No Patient Arrived: Ambulatory Any new allergies or adverse reactions: No Arrival Time: 12:15 Had a fall or experienced change in No Accompanied By: self activities of daily living that may affect Transfer Assistance: None risk of falls: Patient Identification Verified: Yes Signs or symptoms of abuse/neglect since last No Secondary Verification Process Yes visito Completed: Hospitalized since last visit: No Patient Requires Transmission-Based No Pain Present Now: No Precautions: Patient Has Alerts: No Electronic Signature(s) Signed: 08/24/2014 4:44:11 PM By: Montey Hora Entered By: Montey Hora on 08/24/2014 12:15:53 [name] ([id number]) -------------------------------------------------------------------------------- Clinic Level of Care Assessment Details Patient Name: [name]. Date of Service: 08/24/2014 11:45 AM Medical Record Number: [id number] Patient Account Number: [id number] Date of Birth/Sex: [birth year] (59 y.o. Female) Treating RN: Montey Hora Primary Care Physician: Clayborn Bigness Other Clinician: Referring Physician: Clayborn Bigness Treating Physician/Extender: Frann Rider in Treatment: 1 Clinic Level of Care Assessment Items TOOL 4 Quantity Score '[]'$  - Use when only an EandM is performed on FOLLOW-UP visit 0 ASSESSMENTS - Nursing Assessment / Reassessment X - Reassessment of Co-morbidities (includes updates  in patient status) 1 10 X - Reassessment of Adherence to Treatment Plan 1 5 ASSESSMENTS - Wound and Skin Assessment / Reassessment X - Simple Wound Assessment / Reassessment - one wound 1 5 '[]'$  - Complex Wound Assessment / Reassessment - multiple wounds 0 '[]'$  - Dermatologic / Skin Assessment (not related to wound area) 0 ASSESSMENTS - Focused Assessment '[]'$  - Circumferential Edema Measurements - multi extremities 0 '[]'$  - Nutritional Assessment / Counseling / Intervention 0 '[]'$  - Lower Extremity Assessment (monofilament, tuning fork, pulses) 0 '[]'$  - Peripheral Arterial Disease Assessment (using hand held doppler) 0 ASSESSMENTS - Ostomy and/or Continence Assessment and Care '[]'$  - Incontinence Assessment and Management 0 '[]'$  - Ostomy Care Assessment and Management (repouching, etc.) 0 PROCESS - Coordination of Care X - Simple Patient / Family Education for ongoing care 1 15 '[]'$  - Complex (extensive) Patient / Family Education for ongoing care 0 '[]'$  - Staff obtains Programmer, systems, Records, Test Results / Process Orders 0 '[]'$  - Staff telephones HHA, Nursing Homes / Clarify orders / etc 0 '[]'$  - Routine Transfer to another Facility (non-emergent condition) 0 RASHAN, PATIENT. ([id number]) '[]'$  - Routine Hospital Admission (non-emergent condition) 0 '[]'$  - New Admissions / Biomedical engineer / Ordering NPWT, Apligraf, etc. 0 '[]'$  - Emergency Hospital Admission (emergent condition) 0 X - Simple Discharge Coordination 1 10 '[]'$  - Complex (extensive) Discharge Coordination 0 PROCESS - Special Needs '[]'$  - Pediatric / Minor Patient Management 0 '[]'$  - Isolation Patient Management 0 '[]'$  - Hearing / Language / Visual special needs 0 '[]'$  - Assessment of Community assistance (transportation, D/C planning, etc.) 0 '[]'$  - Additional assistance / Altered mentation 0 '[]'$  - Support Surface(s) Assessment (bed, cushion, seat, etc.) 0 INTERVENTIONS - Wound Cleansing / Measurement X - Simple Wound Cleansing - one wound 1 5 '[]'$  -  Complex Wound Cleansing - multiple wounds 0 X - Wound Imaging (photographs - any number of wounds) 1 5 '[]'$  - Wound Tracing (instead of photographs) 0 X -  Simple Wound Measurement - one wound 1 5 '[]'$  - Complex Wound Measurement - multiple wounds 0 INTERVENTIONS - Wound Dressings '[]'$  - Small Wound Dressing one or multiple wounds 0 X - Medium Wound Dressing one or multiple wounds 1 15 '[]'$  - Large Wound Dressing one or multiple wounds 0 '[]'$  - Application of Medications - topical 0 '[]'$  - Application of Medications - injection 0 INTERVENTIONS - Miscellaneous '[]'$  - External ear exam 0 Gaccione, Abagayle P. ([id number]) '[]'$  - Specimen Collection (cultures, biopsies, blood, body fluids, etc.) 0 '[]'$  - Specimen(s) / Culture(s) sent or taken to Lab for analysis 0 '[]'$  - Patient Transfer (multiple staff / Harrel Lemon Lift / Similar devices) 0 '[]'$  - Simple Staple / Suture removal (25 or less) 0 '[]'$  - Complex Staple / Suture removal (26 or more) 0 '[]'$  - Hypo / Hyperglycemic Management (close monitor of Blood Glucose) 0 '[]'$  - Ankle / Brachial Index (ABI) - do not check if billed separately 0 X - Vital Signs 1 5 Has the patient been seen at the hospital within the last three years: Yes Total Score: 80 Level Of Care: New/Established - Level 3 Electronic Signature(s) Signed: 08/24/2014 2:07:33 PM By: Montey Hora Entered By: Montey Hora on 08/24/2014 14:07:32 [name] ([id number]) -------------------------------------------------------------------------------- Encounter Discharge Information Details Patient Name: Tracy Picket P. Date of Service: 08/24/2014 11:45 AM Medical Record Number: [id number] Patient Account Number: [id number] Date of Birth/Sex: [birth year] (59 y.o. Female) Treating RN: Montey Hora Primary Care Physician: Clayborn Bigness Other Clinician: Referring Physician: Clayborn Bigness Treating Physician/Extender: Frann Rider in Treatment: 1 Encounter Discharge Information  Items Discharge Pain Level: 0 Discharge Condition: Stable Ambulatory Status: Ambulatory Discharge Destination: Home Transportation: Private Auto Accompanied By: self Schedule Follow-up Appointment: Yes Medication Reconciliation completed and provided to Patient/Care No Murl Golladay: Provided on Clinical Summary of Care: 08/24/2014 Form Type Recipient Paper Patient AR Electronic Signature(s) Signed: 08/24/2014 2:08:56 PM By: Montey Hora Previous Signature: 08/24/2014 12:49:59 PM Version By: Ruthine Dose Entered By: Montey Hora on 08/24/2014 14:08:56 Galster, Estefany P. ([id number]) -------------------------------------------------------------------------------- Multi Wound Chart Details Patient Name: Tracy Picket P. Date of Service: 08/24/2014 11:45 AM Medical Record Number: [id number] Patient Account Number: [id number] Date of Birth/Sex: 1955/01/12 (59 y.o. Female) Treating RN: Montey Hora Primary Care Physician: Clayborn Bigness Other Clinician: Referring Physician: Clayborn Bigness Treating Physician/Extender: Frann Rider in Treatment: 1 Vital Signs Height(in): 68 Pulse(bpm): 102 Weight(lbs): 183 Blood Pressure 114/68 (mmHg): Body Mass Index(BMI): 28 Temperature(F): 98.8 Respiratory Rate 18 (breaths/min): Photos: [1:No Photos] [N/A:N/A] Wound Location: [1:Right Gluteus - Medial] [N/A:N/A] Wounding Event: [1:Radiation Burn] [N/A:N/A] Primary Etiology: [1:Necrosis (Radiation)] [N/A:N/A] Comorbid History: [1:Chronic Obstructive Pulmonary Disease (COPD), Hypertension, Received Radiation] [N/A:N/A] Date Acquired: [1:05/24/2014] [N/A:N/A] Weeks of Treatment: [1:1] [N/A:N/A] Wound Status: [1:Open] [N/A:N/A] Measurements L x W x D 1.8x2.9x0.2 [N/A:N/A] (cm) Area (cm) : [1:4.1] [N/A:N/A] Volume (cm) : [1:0.82] [N/A:N/A] % Reduction in Area: [1:29.50%] [N/A:N/A] % Reduction in Volume: 29.40% [N/A:N/A] Classification: [1:Full Thickness Without Exposed Support  Structures] [N/A:N/A] Exudate Amount: [1:Medium] [N/A:N/A] Exudate Type: [1:Serosanguineous] [N/A:N/A] Exudate Color: [1:red, brown] [N/A:N/A] Foul Odor After [1:Yes] [N/A:N/A] Cleansing: Odor Anticipated Due to No [N/A:N/A] Product Use: Wound Margin: [1:Distinct, outline attached] [N/A:N/A] Granulation Amount: [1:Large (67-100%)] [N/A:N/A] Granulation Quality: [1:Red] [N/A:N/A] Necrotic Amount: Small (1-33%) N/A N/A Exposed Structures: Fascia: No N/A N/A Fat: No Tendon: No Muscle: No Joint: No Bone: No Limited to Skin Breakdown Epithelialization: None N/A N/A Periwound Skin Texture: Edema: No N/A N/A Excoriation: No Induration: No Callus: No Crepitus: No Fluctuance: No  Friable: No Rash: No Scarring: No Periwound Skin Moist: Yes N/A N/A Moisture: Maceration: No Dry/Scaly: No Periwound Skin Color: Atrophie Blanche: No N/A N/A Cyanosis: No Ecchymosis: No Erythema: No Hemosiderin Staining: No Mottled: No Pallor: No Rubor: No Temperature: No Abnormality N/A N/A Tenderness on Yes N/A N/A Palpation: Wound Preparation: Ulcer Cleansing: N/A N/A Rinsed/Irrigated with Saline Topical Anesthetic Applied: Other: lidocaine 4% Treatment Notes Electronic Signature(s) Signed: 08/24/2014 4:44:11 PM By: Montey Hora Entered By: Montey Hora on 08/24/2014 12:32:57 Tracy, [name] ([id number]) -------------------------------------------------------------------------------- [place] Details Patient Name: [name]. Date of Service: 08/24/2014 11:45 AM Medical Record Number: [id number] Patient Account Number: [id number] Date of Birth/Sex: 01-Dec-1955 (59 y.o. Female) Treating RN: Montey Hora Primary Care Physician: Clayborn Bigness Other Clinician: Referring Physician: Clayborn Bigness Treating Physician/Extender: Frann Rider in Treatment: 1 Active Inactive Abuse / Safety / Falls / Self Care Management Nursing Diagnoses: Impaired  physical mobility Potential for falls Self care deficit: actual or potential Goals: Patient will remain injury free Date Initiated: 08/17/2014 Goal Status: Active Patient/caregiver will verbalize understanding of skin care regimen Date Initiated: 08/17/2014 Goal Status: Active Patient/caregiver will verbalize/demonstrate measure taken to improve self care Date Initiated: 08/17/2014 Goal Status: Active Patient/caregiver will verbalize/demonstrate measures taken to improve the patient's personal safety Date Initiated: 08/17/2014 Goal Status: Active Patient/caregiver will verbalize/demonstrate measures taken to prevent injury and/or falls Date Initiated: 08/17/2014 Goal Status: Active Patient/caregiver will verbalize/demonstrate understanding of what to do in case of emergency Date Initiated: 08/17/2014 Goal Status: Active Interventions: Assess fall risk on admission and as needed Assess: immobility, friction, shearing, incontinence upon admission and as needed Assess impairment of mobility on admission and as needed per policy Assess self care needs on admission and as needed Provide education on basic hygiene Provide education on fall prevention Provide education on personal and home safety Tracy, [name] ([id number]) Provide education on safe transfers Treatment Activities: Education provided on Basic Hygiene : 08/17/2014 Notes: Orientation to the Wound Care Program Nursing Diagnoses: Knowledge deficit related to the wound healing center program Goals: Patient/caregiver will verbalize understanding of the [place] [facility] Date Initiated: 08/17/2014 Goal Status: Active Interventions: Provide education on orientation to the wound center Notes: Wound/Skin Impairment Nursing Diagnoses: Impaired tissue integrity Knowledge deficit related to smoking impact on wound healing Knowledge deficit related to ulceration/compromised skin integrity Goals: Patient/caregiver  will verbalize understanding of skin care regimen Date Initiated: 08/17/2014 Goal Status: Active Ulcer/skin breakdown will have a volume reduction of 30% by week 4 Date Initiated: 08/17/2014 Goal Status: Active Ulcer/skin breakdown will have a volume reduction of 50% by week 8 Date Initiated: 08/17/2014 Goal Status: Active Ulcer/skin breakdown will have a volume reduction of 80% by week 12 Date Initiated: 08/17/2014 Goal Status: Active Ulcer/skin breakdown will heal within 14 weeks Date Initiated: 08/17/2014 Goal Status: Active Interventions: Tracy, [name] ([id number]) Assess patient/caregiver ability to obtain necessary supplies Assess patient/caregiver ability to perform ulcer/skin care regimen upon admission and as needed Assess ulceration(s) every visit Provide education on smoking Provide education on ulcer and skin care Notes: Electronic Signature(s) Signed: 08/24/2014 4:44:11 PM By: Montey Hora Entered By: Montey Hora on 08/24/2014 12:27:02 [place], [name] ([id number]) -------------------------------------------------------------------------------- Pain Assessment Details Patient Name: Tracy Picket P. Date of Service: 08/24/2014 11:45 AM Medical Record Number: [id number] Patient Account Number: [id number] Date of Birth/Sex: 1955-03-23 (59 y.o. Female) Treating RN: Montey Hora Primary Care Physician: Clayborn Bigness Other Clinician: Referring Physician: Clayborn Bigness Treating Physician/Extender: Frann Rider in Treatment: 1 Active  Problems Location of Pain Severity and Description of Pain Patient Has Paino Yes Site Locations Pain Location: Pain in Ulcers With Dressing Change: Yes Duration of the Pain. Constant / Intermittento Constant Pain Management and Medication Current Pain Management: Electronic Signature(s) Signed: 08/24/2014 4:44:11 PM By: Montey Hora Entered By: Montey Hora on 08/24/2014 12:16:08 [name]  ([id number]) -------------------------------------------------------------------------------- Patient/Caregiver Education Details Patient Name: [name]. Date of Service: 08/24/2014 11:45 AM Medical Record Number: [id number] Patient Account Number: [id number] Date of Birth/Gender: 10/07/1955 (59 y.o. Female) Treating RN: Montey Hora Primary Care Physician: Clayborn Bigness Other Clinician: Referring Physician: Clayborn Bigness Treating Physician/Extender: Frann Rider in Treatment: 1 Education Assessment Education Provided To: Patient Education Topics Provided Wound/Skin Impairment: Handouts: Other: wound care as ordered Methods: Demonstration, Explain/Verbal Responses: State content correctly Electronic Signature(s) Signed: 08/24/2014 2:09:17 PM By: Montey Hora Entered By: Montey Hora on 08/24/2014 14:09:17 Houdek, [name] ([id number]) -------------------------------------------------------------------------------- Wound Assessment Details Patient Name: [name], [name] Date of Service: 08/24/2014 11:45 AM Medical Record Number: [id number] Patient Account Number: [id number] Date of Birth/Sex: 22-Feb-1955 (59 y.o. Female) Treating RN: Montey Hora Primary Care Physician: Clayborn Bigness Other Clinician: Referring Physician: Clayborn Bigness Treating Physician/Extender: Frann Rider in Treatment: 1 Wound Status Wound Number: 1 Primary Necrosis (Radiation) Etiology: Wound Location: Right Gluteus - Medial Wound Open Wounding Event: Radiation Burn Status: Date Acquired: 05/24/2014 Comorbid Chronic Obstructive Pulmonary Weeks Of Treatment: 1 History: Disease (COPD), Hypertension, Clustered Wound: No Received Radiation Photos Photo Uploaded By: Montey Hora on 08/24/2014 16:42:32 Wound Measurements Length: (cm) 1.8 Width: (cm) 2.9 Depth: (cm) 0.2 Area: (cm) 4.1 Volume: (cm) 0.82 % Reduction in Area: 29.5% % Reduction in Volume:  29.4% Epithelialization: None Tunneling: No Undermining: No Wound Description Full Thickness Without Exposed Classification: Support Structures Wound Margin: Distinct, outline attached Exudate Medium Amount: Exudate Type: Serosanguineous Exudate Color: red, brown Foul Odor After Cleansing: Yes Due to Product Use: No Wound Bed Granulation Amount: Large (67-100%) Exposed Structure Granulation Quality: Red Fascia Exposed: No Holsworth, Lotta P. ([id number]) Necrotic Amount: Small (1-33%) Fat Layer Exposed: No Necrotic Quality: Adherent Slough Tendon Exposed: No Muscle Exposed: No Joint Exposed: No Bone Exposed: No Limited to Skin Breakdown Periwound Skin Texture Texture Color No Abnormalities Noted: No No Abnormalities Noted: No Callus: No Atrophie Blanche: No Crepitus: No Cyanosis: No Excoriation: No Ecchymosis: No Fluctuance: No Erythema: No Friable: No Hemosiderin Staining: No Induration: No Mottled: No Localized Edema: No Pallor: No Rash: No Rubor: No Scarring: No Temperature / Pain Moisture Temperature: No Abnormality No Abnormalities Noted: No Tenderness on Palpation: Yes Dry / Scaly: No Maceration: No Moist: Yes Wound Preparation Ulcer Cleansing: Rinsed/Irrigated with Saline Topical Anesthetic Applied: Other: lidocaine 4%, Treatment Notes Wound #1 (Right, Medial Gluteus) 1. Cleansed with: Clean wound with Normal Saline 2. Anesthetic Topical Lidocaine 4% cream to wound bed prior to debridement 3. Peri-wound Care: Skin Prep Other peri-wound care (specify in notes) 4. Dressing Applied: Prisma Ag 5. Secondary Dressing Applied Bordered Foam Dressing Hydrocolloid Notes stomahesive and hydrocolloid used to windowpain periwound skin Tracy, [name] ([id number]) Electronic Signature(s) Signed: 08/24/2014 4:44:11 PM By: Montey Hora Entered By: Montey Hora on 08/24/2014 12:26:55 [name], [name]  ([id number]) -------------------------------------------------------------------------------- Vitals Details Patient Name: Tracy Picket P. Date of Service: 08/24/2014 11:45 AM Medical Record Number: [id number] Patient Account Number: [id number] Date of Birth/Sex: 05/09/1955 (59 y.o. Female) Treating RN: Montey Hora Primary Care Physician: Clayborn Bigness Other Clinician: Referring Physician: Clayborn Bigness Treating Physician/Extender: Frann Rider in Treatment: 1 Vital Signs  Time Taken: 12:15 Temperature (F): 98.8 Height (in): 68 Pulse (bpm): 102 Weight (lbs): 183 Respiratory Rate (breaths/min): 18 Body Mass Index (BMI): 27.8 Blood Pressure (mmHg): 114/68 Reference Range: 80 - 120 mg / dl Electronic Signature(s) Signed: 08/24/2014 4:44:11 PM By: Montey Hora Entered By: Montey Hora on 08/24/2014 12:16:56

## 2014-08-25 NOTE — Telephone Encounter (Signed)
Critical potassium level.  MD aware pt to receive 20 meq potassium in infusion.  Pt verbalized she will pick up prescription via [facility] on the way home and verbalizes an understanding she will be picking it up [place] drive through.

## 2014-08-28 ENCOUNTER — Inpatient Hospital Stay: Payer: Commercial Managed Care - HMO

## 2014-08-28 ENCOUNTER — Ambulatory Visit: Payer: Commercial Managed Care - HMO

## 2014-08-28 ENCOUNTER — Telehealth: Payer: Self-pay | Admitting: *Deleted

## 2014-08-28 DIAGNOSIS — C20 Malignant neoplasm of rectum: Secondary | ICD-10-CM

## 2014-08-28 MED ORDER — OXYCODONE-ACETAMINOPHEN 10-325 MG PO TABS: 1.0000 | ORAL_TABLET | Freq: Four times a day (QID) | ORAL | Status: DC | PRN

## 2014-08-28 NOTE — Telephone Encounter (Signed)
Left msg that rx is ready to pick up

## 2014-08-29 ENCOUNTER — Ambulatory Visit: Payer: Commercial Managed Care - HMO

## 2014-08-29 ENCOUNTER — Ambulatory Visit: Payer: Commercial Managed Care - HMO | Attending: Radiation Oncology

## 2014-08-29 ENCOUNTER — Inpatient Hospital Stay: Payer: Commercial Managed Care - HMO

## 2014-08-30 ENCOUNTER — Other Ambulatory Visit: Payer: Self-pay | Admitting: *Deleted

## 2014-08-30 ENCOUNTER — Inpatient Hospital Stay: Payer: Commercial Managed Care - HMO

## 2014-08-30 ENCOUNTER — Ambulatory Visit: Payer: Commercial Managed Care - HMO

## 2014-08-30 NOTE — Patient Outreach (Signed)
[facility]) Care Management  08/30/2014  [name] May 27, 1955 [id number]   Follow up phone call to follow up on receipt of hospital bed and behavioral health needs.  HIPPA  compliant voicemail message left for a return call.    [name] [facility] Care Management [phone]

## 2014-08-31 ENCOUNTER — Inpatient Hospital Stay: Payer: Commercial Managed Care - HMO

## 2014-08-31 ENCOUNTER — Ambulatory Visit: Payer: Commercial Managed Care - HMO

## 2014-08-31 ENCOUNTER — Ambulatory Visit: Payer: Commercial Managed Care - HMO | Admitting: Surgery

## 2014-09-01 ENCOUNTER — Inpatient Hospital Stay: Payer: Commercial Managed Care - HMO

## 2014-09-01 ENCOUNTER — Ambulatory Visit: Payer: Commercial Managed Care - HMO

## 2014-09-01 ENCOUNTER — Ambulatory Visit: Payer: Commercial Managed Care - HMO | Admitting: Psychiatry

## 2014-09-01 ENCOUNTER — Telehealth: Payer: Self-pay

## 2014-09-01 NOTE — Telephone Encounter (Signed)
Returned call to pt's daughter regarding potassium making her mother itch.  I told daughter to tell her mother to discontinue the potassium and we would like to get labs beginning of week.  Pt's daughter said that no, her mom has labs on 09/07/2014.  I told her I would inform Dr. Mike Gip and I believe she would like them to come in earlier for labs. Daughter then said again she has labs on 9/1 and radiation and MD visit neither confirming or denying if they could come in earlier for labs.  Will inform MD and relay phone # [phone] because daughter says mother's phone is trashed.

## 2014-09-04 ENCOUNTER — Ambulatory Visit: Payer: Commercial Managed Care - HMO

## 2014-09-04 ENCOUNTER — Inpatient Hospital Stay: Payer: Commercial Managed Care - HMO

## 2014-09-05 ENCOUNTER — Ambulatory Visit: Payer: Commercial Managed Care - HMO

## 2014-09-05 ENCOUNTER — Other Ambulatory Visit: Payer: Self-pay | Admitting: *Deleted

## 2014-09-05 ENCOUNTER — Inpatient Hospital Stay: Payer: Commercial Managed Care - HMO

## 2014-09-05 NOTE — Patient Outreach (Signed)
[facility]) Care Management  09/05/2014  [name] October 16, 1955 [id number]   Follow up phone call to patient to re-establish contact and to follow up on DME arranged.  HIPPA compliant voicemail message left for a return call.   [name] [facility] Care Management [phone]

## 2014-09-06 ENCOUNTER — Inpatient Hospital Stay: Payer: Commercial Managed Care - HMO

## 2014-09-06 ENCOUNTER — Ambulatory Visit: Payer: Commercial Managed Care - HMO

## 2014-09-06 ENCOUNTER — Ambulatory Visit
Admission: RE | Admit: 2014-09-06 | Discharge: 2014-09-06 | Disposition: A | Discharge status: Home / Self Care | Payer: Commercial Managed Care - HMO | Source: Ambulatory Visit | Attending: Radiation Oncology | Admitting: Radiation Oncology

## 2014-09-06 ENCOUNTER — Ambulatory Visit: Admission: RE | Admit: 2014-09-06 | Payer: Commercial Managed Care - HMO | Source: Ambulatory Visit

## 2014-09-06 ENCOUNTER — Encounter: Payer: Self-pay | Admitting: Radiation Oncology

## 2014-09-06 VITALS — BP 131/84 | HR 95 | Temp 98.3°F | Resp 18 | Wt 169.9 lb

## 2014-09-06 DIAGNOSIS — C210 Malignant neoplasm of anus, unspecified: Secondary | ICD-10-CM

## 2014-09-07 ENCOUNTER — Inpatient Hospital Stay: Payer: Commercial Managed Care - HMO

## 2014-09-07 ENCOUNTER — Inpatient Hospital Stay: Payer: Commercial Managed Care - HMO | Attending: Hematology and Oncology

## 2014-09-07 ENCOUNTER — Inpatient Hospital Stay (HOSPITAL_BASED_OUTPATIENT_CLINIC_OR_DEPARTMENT_OTHER): Payer: Commercial Managed Care - HMO | Admitting: Hematology and Oncology

## 2014-09-07 ENCOUNTER — Ambulatory Visit: Payer: Commercial Managed Care - HMO

## 2014-09-07 ENCOUNTER — Other Ambulatory Visit: Payer: Self-pay | Admitting: *Deleted

## 2014-09-07 VITALS — BP 122/80 | HR 106 | Temp 98.4°F | Ht 68.0 in | Wt 175.2 lb

## 2014-09-07 DIAGNOSIS — E039 Hypothyroidism, unspecified: Secondary | ICD-10-CM

## 2014-09-07 DIAGNOSIS — Z9221 Personal history of antineoplastic chemotherapy: Secondary | ICD-10-CM

## 2014-09-07 DIAGNOSIS — G629 Polyneuropathy, unspecified: Secondary | ICD-10-CM | POA: Insufficient documentation

## 2014-09-07 DIAGNOSIS — D649 Anemia, unspecified: Secondary | ICD-10-CM

## 2014-09-07 DIAGNOSIS — F4310 Post-traumatic stress disorder, unspecified: Secondary | ICD-10-CM | POA: Diagnosis not present

## 2014-09-07 DIAGNOSIS — K219 Gastro-esophageal reflux disease without esophagitis: Secondary | ICD-10-CM

## 2014-09-07 DIAGNOSIS — Z923 Personal history of irradiation: Secondary | ICD-10-CM

## 2014-09-07 DIAGNOSIS — C210 Malignant neoplasm of anus, unspecified: Secondary | ICD-10-CM

## 2014-09-07 DIAGNOSIS — Z8701 Personal history of pneumonia (recurrent): Secondary | ICD-10-CM | POA: Diagnosis not present

## 2014-09-07 DIAGNOSIS — Z933 Colostomy status: Secondary | ICD-10-CM | POA: Insufficient documentation

## 2014-09-07 DIAGNOSIS — Z809 Family history of malignant neoplasm, unspecified: Secondary | ICD-10-CM

## 2014-09-07 DIAGNOSIS — J449 Chronic obstructive pulmonary disease, unspecified: Secondary | ICD-10-CM

## 2014-09-07 DIAGNOSIS — G4730 Sleep apnea, unspecified: Secondary | ICD-10-CM

## 2014-09-07 DIAGNOSIS — Z86718 Personal history of other venous thrombosis and embolism: Secondary | ICD-10-CM | POA: Insufficient documentation

## 2014-09-07 DIAGNOSIS — J45909 Unspecified asthma, uncomplicated: Secondary | ICD-10-CM | POA: Diagnosis not present

## 2014-09-07 DIAGNOSIS — F17210 Nicotine dependence, cigarettes, uncomplicated: Secondary | ICD-10-CM | POA: Insufficient documentation

## 2014-09-07 DIAGNOSIS — F319 Bipolar disorder, unspecified: Secondary | ICD-10-CM | POA: Insufficient documentation

## 2014-09-07 DIAGNOSIS — Z79899 Other long term (current) drug therapy: Secondary | ICD-10-CM | POA: Insufficient documentation

## 2014-09-07 DIAGNOSIS — R251 Tremor, unspecified: Secondary | ICD-10-CM | POA: Insufficient documentation

## 2014-09-07 DIAGNOSIS — F209 Schizophrenia, unspecified: Secondary | ICD-10-CM

## 2014-09-07 DIAGNOSIS — F419 Anxiety disorder, unspecified: Secondary | ICD-10-CM | POA: Diagnosis not present

## 2014-09-07 DIAGNOSIS — M5136 Other intervertebral disc degeneration, lumbar region: Secondary | ICD-10-CM

## 2014-09-07 DIAGNOSIS — M797 Fibromyalgia: Secondary | ICD-10-CM | POA: Insufficient documentation

## 2014-09-07 DIAGNOSIS — E041 Nontoxic single thyroid nodule: Secondary | ICD-10-CM | POA: Insufficient documentation

## 2014-09-07 DIAGNOSIS — Z7952 Long term (current) use of systemic steroids: Secondary | ICD-10-CM | POA: Diagnosis not present

## 2014-09-07 DIAGNOSIS — F329 Major depressive disorder, single episode, unspecified: Secondary | ICD-10-CM | POA: Diagnosis not present

## 2014-09-07 DIAGNOSIS — E876 Hypokalemia: Secondary | ICD-10-CM

## 2014-09-07 DIAGNOSIS — R0602 Shortness of breath: Secondary | ICD-10-CM

## 2014-09-07 DIAGNOSIS — C20 Malignant neoplasm of rectum: Secondary | ICD-10-CM

## 2014-09-07 DIAGNOSIS — R197 Diarrhea, unspecified: Secondary | ICD-10-CM

## 2014-09-07 LAB — COMPREHENSIVE METABOLIC PANEL
ALT: 15 U/L (ref 14–54)
AST: 33 U/L (ref 15–41)
Albumin: 3 g/dL — ABNORMAL LOW (ref 3.5–5.0)
Alkaline Phosphatase: 62 U/L (ref 38–126)
Anion gap: 2 — ABNORMAL LOW (ref 5–15)
BUN: <5 mg/dL — ABNORMAL LOW (ref 6–20)
CO2: 26 mmol/L (ref 22–32)
Calcium: 7.9 mg/dL — ABNORMAL LOW (ref 8.9–10.3)
Chloride: 110 mmol/L (ref 101–111)
Creatinine, Ser: 0.9 mg/dL (ref 0.44–1.00)
GFR calc Af Amer: >60 mL/min (ref >60)
GFR calc non Af Amer: >60 mL/min (ref >60)
Glucose, Bld: 91 mg/dL (ref 65–99)
Potassium: 3.3 mmol/L — ABNORMAL LOW (ref 3.5–5.1)
Sodium: 138 mmol/L (ref 135–145)
Total Bilirubin: 0.4 mg/dL (ref 0.3–1.2)
Total Protein: 6.8 g/dL (ref 6.5–8.1)

## 2014-09-07 LAB — CBC WITH DIFFERENTIAL/PLATELET
Basophils Absolute: 0 10*3/uL (ref 0–0.1)
Basophils Relative: 1 %
Eosinophils Absolute: 0.1 10*3/uL (ref 0–0.7)
Eosinophils Relative: 3 %
HCT: 36.3 % (ref 35.0–47.0)
Hemoglobin: 12.3 g/dL (ref 12.0–16.0)
Lymphocytes Relative: 19 %
Lymphs Abs: 0.9 10*3/uL — ABNORMAL LOW (ref 1.0–3.6)
MCH: 33.7 pg (ref 26.0–34.0)
MCHC: 34 g/dL (ref 32.0–36.0)
MCV: 98.9 fL (ref 80.0–100.0)
Monocytes Absolute: 0.5 10*3/uL (ref 0.2–0.9)
Monocytes Relative: 10 %
Neutro Abs: 3.1 10*3/uL (ref 1.4–6.5)
Neutrophils Relative %: 67 %
Platelets: 189 10*3/uL (ref 150–440)
RBC: 3.67 MIL/uL — ABNORMAL LOW (ref 3.80–5.20)
RDW: 22.7 % — ABNORMAL HIGH (ref 11.5–14.5)
WBC: 4.7 10*3/uL (ref 3.6–11.0)

## 2014-09-07 MED ORDER — OXYCODONE-ACETAMINOPHEN 10-325 MG PO TABS: 1.0000 | ORAL_TABLET | Freq: Four times a day (QID) | ORAL | Status: DC | PRN

## 2014-09-07 MED ORDER — POTASSIUM CHLORIDE CRYS ER 20 MEQ PO TBCR: 20.0000 meq | EXTENDED_RELEASE_TABLET | Freq: Two times a day (BID) | ORAL | Status: DC

## 2014-09-07 NOTE — Progress Notes (Signed)
Patient here for follow up. Radiation deferred this week due to annal wound. Patient has been having loose stools (cholostomy). Needs refill for pain medications.

## 2014-09-07 NOTE — Progress Notes (Signed)
[facility] day:  09/07/2014   Chief Complaint: [name] is a 59 y.o. 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  HPI:  The patient was last seen in the medical oncology clinic on 08/25/2014.  At that time, she was day 26 of cycle #2 5FU and mitomycin-C with radiation.  She was on a radiation break for perirectal breakdown.  She was being considered for hyperbaric oxygen.  During the interim, she has continued to go to wound care. She feels overwhelmed and stressed. She is having some issues with someone at the boarding house. Regarding her health she is doing "okay". She is still hurting.   She states that radiation has been put off for 2-3 weeks. She goes to wound care tomorrow. He has not been started on hyperbaric oxygen yet.  She was taking 2 potassium pills a day. She states that she is out now. Her colostomy bag has liquid stool which is new. She is having to empty her bag 3-4 times a day.   Past Medical History  Diagnosis Date  . Schizophrenia   . Asthma   . GERD (gastroesophageal reflux disease)   . Anxiety   . Depression   . Bipolar disorder   . COPD (chronic obstructive pulmonary disease)   . Occasional tremors     right hand  . PTSD (post-traumatic stress disorder)   . Shortness of breath dyspnea   . Fibromyalgia   . DVT (deep venous thrombosis) 2011    RUE  . Thyroid nodule   . DDD (degenerative disc disease), lumbar   . Spinal stenosis   . Peripheral neuropathy   . Rotator cuff tear     right  . Pneumonia 2011  . Hypothyroidism     no meds currently  . Anemia     during pregnancy only  . Hypertension     Off meds x 15 years-well controlled now per pt  . Squamous cell cancer, anus   . Severe obstructive sleep apnea 06/27/2014    Past Surgical History  Procedure Laterality Date  . Foot surgery Right    . Tubal ligation    . Eye surgery Bilateral   . Mouth surgery  2002  . Rectal biopsy N/A 05/08/2014    Procedure: BIOPSY RECTAL;  Surgeon: Marlyce Huge, MD;  Location: [facility];  Service: General;  Laterality: N/A;  . Evaluation under anesthesia with hemorrhoidectomy N/A 05/08/2014    Procedure: EXAM UNDER ANESTHESIA WITH HEMORRHOIDECTOMY;  Surgeon: Marlyce Huge, MD;  Location: [facility];  Service: General;  Laterality: N/A;  . Portacath placement N/A 05/20/2014    Procedure: INSERTION PORT-A-CATH;  Surgeon: Florene Glen, MD;  Location: [facility];  Service: General;  Laterality: N/A;  . Laparoscopic diverted colostomy N/A 05/26/2014    Procedure: LAPAROSCOPIC DIVERTED COLOSTOMY;  Surgeon: Marlyce Huge, MD;  Location: [facility] OR[facility];  Service: General;  Laterality: N/A;    Family History  Problem Relation Age of Onset  . Cancer Maternal Aunt   . Cancer Paternal Uncle     Social History:  reports that she has been smoking Cigarettes.  She has a 11 pack-year smoking history. She has never used smokeless tobacco. She reports that she drinks alcohol. She reports that she does not use illicit drugs.  The patient is accompanied by her friend, Nilda Calamity.  Allergies:  Allergies  Allergen Reactions  . Sulfa Antibiotics Hives    Current Medications: Current Outpatient Prescriptions  Medication Sig Dispense Refill  . albuterol (PROVENTIL HFA;VENTOLIN HFA) 108 (90 BASE) MCG/ACT inhaler Inhale 1 puff into the lungs every 6 (six) hours as needed for wheezing or shortness of breath.    Marland Kitchen albuterol (PROVENTIL) (2.5 MG/3ML) 0.083% nebulizer solution Inhale 3 mLs (2.5 mg total) into the lungs every 6 (six) hours as needed for wheezing or shortness of breath. 75 mL 12  . Alum & Mag Hydroxide-Simeth (MAGIC MOUTHWASH) SOLN Take 5 mLs by mouth 4 (four) times daily. 480 mL 3  . budesonide-formoterol (SYMBICORT) 160-4.5 MCG/ACT inhaler Inhale 2 puffs into the lungs 2 (two) times daily. 1  Inhaler 12  . Calcium Carb-Cholecalciferol (CALCIUM 600+D) 600-800 MG-UNIT TABS Take 1 tablet by mouth 2 (two) times daily.     . clonazePAM (KLONOPIN) 1 MG tablet Take 1 tablet (1 mg total) by mouth 3 (three) times daily as needed for anxiety. 42 tablet 0  . docusate sodium (COLACE) 100 MG capsule Take 2 capsules (200 mg total) by mouth 2 (two) times daily. (Patient taking differently: Take 200 mg by mouth 2 (two) times daily as needed for mild constipation. ) 120 capsule 0  . DULoxetine (CYMBALTA) 30 MG capsule     . escitalopram (LEXAPRO) 20 MG tablet Take 20 mg by mouth at bedtime.    Marland Kitchen etodolac (LODINE) 500 MG tablet Take 500 mg by mouth 2 (two) times daily.    Marland Kitchen KLOR-CON M10 10 MEQ tablet     . lidocaine (XYLOCAINE) 2 % jelly Apply 1 application topically as needed (for pain).    . ondansetron (ZOFRAN) 8 MG tablet Take 1 tablet (8 mg total) by mouth 2 (two) times daily. Start the day after chemo for 2 days. Then take as needed for nausea or vomiting. 30 tablet 1  . oxybutynin (DITROPAN) 5 MG tablet Take 5 mg by mouth 2 (two) times daily.    Marland Kitchen oxyCODONE-acetaminophen (PERCOCET) 10-325 MG per tablet Take 1 tablet by mouth every 6 (six) hours as needed for pain. 40 tablet 0  . polyethylene glycol powder (GLYCOLAX/MIRALAX) powder 1 cap full in a full glass of water, once a day for 5 days. 255 g 0  . potassium chloride SA (K-DUR,KLOR-CON) 20 MEQ tablet Take 1 tablet (20 mEq total) by mouth 2 (two) times daily. Through Thursday then  See md on Friday 14 tablet 0  . predniSONE (DELTASONE) 20 MG tablet Take 2 tablets (40 mg total) by mouth daily. 10 tablet 0  . psyllium (METAMUCIL) 58.6 % powder Take 1 packet by mouth 2 (two) times daily as needed (for constipation).    . ranitidine (ZANTAC) 150 MG tablet Take 150 mg by mouth 2 (two) times daily.    Marland Kitchen senna (SENOKOT) 8.6 MG tablet Take 2 tablets by mouth 2 (two) times daily as needed for constipation.    . silver sulfADIAZINE (SILVADENE) 1 % cream  Apply 1 application topically 2 (two) times daily. 50 g 2  . simvastatin (ZOCOR) 40 MG tablet Take 40 mg by mouth at bedtime.     . sucralfate (CARAFATE) 1 G tablet Take 1 tablet (1 g total) by mouth 3 (three) times daily. Dissolve tablet in 2-3 tbsp of warm water; swish and swallow. 90 tablet 3  . tiotropium (SPIRIVA) 18 MCG inhalation capsule Place 18 mcg into inhaler and inhale daily.    . traMADol (ULTRAM) 50  MG tablet     . zolpidem (AMBIEN) 5 MG tablet Take 5 mg by mouth at bedtime as needed for sleep.     No current facility-administered medications for this visit.   Facility-Administered Medications Ordered in Other Visits  Medication Dose Route Frequency Provider Last Rate Last Dose  . heparin lock flush 100 unit/mL  500 Units Intravenous Once Lequita Asal, MD      . sodium chloride 0.9 % 100 mL with potassium chloride 20 mEq infusion  20 mEq Intravenous Once Lequita Asal, MD      . sodium chloride 0.9 % injection 10 mL  10 mL Intravenous PRN Lequita Asal, MD        Review of Systems:  GENERAL:  Feels "ok".  No fevers or sweats.  Weight  fluctuating. PERFORMANCE STATUS (ECOG):  2 HEENT:  No visual changes, runny nose, mouth sores or sore throat. Lungs: No shortness of breath or cough.  No hemoptysis. Cardiac:  No chest pain, palpitations, orthopnea, or PND. GI:  Colostomy.  Liquid stool.  No nausea, vomiting, constipation, melena or hematochezia. GU:  No urgency, frequency, dysuria, or hematuria. Musculoskeletal:  No back pain.  No joint pain.  No muscle tenderness. Extremities:  No pain or swelling. Skin:  Still on break from radiation secondary to peri-rectal wound. Neuro:  No headache, numbness or weakness, balance or coordination issues. Endocrine:  No diabetes, thyroid issues, hot flashes or night sweats. Psych:  No mood changes or depression.  Anxiety improved since start of treatment. Pain:  Decreased pain associated with anal mass. Review of systems:   All other systems reviewed and found to be negative.   Physical Exam: Blood pressure 122/80, pulse 106, temperature 98.4 F (36.9 C), temperature source Tympanic, height _0  (1.727 m), weight 175 lb 2.5 oz (79.45 kg).  GENERAL:  Chronically ill appearing woman lying on her side in the exam room in no acute distress.  MENTAL STATUS:  Alert and oriented to person, place and time. HEAD:  Pearline Cables hair.  Normocephalic, atraumatic, face symmetric, no Cushingoid features. EYES:  Blue eyes.  Pupils equal round and reactive to light and accomodation.  No conjunctivitis or scleral icterus. ENT: No oral lesions.  Tongue normal. Mucous membranes moist.  RESPIRATORY:  Clear to auscultation without rales, wheezes or rhonchi. CARDIOVASCULAR:  Regular rate and rhythm without murmur, rub or gallop. ABDOMEN:  Soft, non-tender, with active bowel sounds, and no hepatosplenomegaly.  No masses. RECTUM:  Perirectal elliptical breakdown measuring 2 x 2.5 cm. SKIN:  Perirectal breakdown.  No other rashes, ulcers or lesions. EXTREMITIES: No edema, no skin discoloration or tenderness.  No palpable cords. LYMPH NODES: No palpable cervical, supraclavicular, axillary or inguinal adenopathy  NEUROLOGICAL: Unremarkable. PSYCH:  Appropriate.   Appointment on 09/07/2014  Component Date Value Ref Range Status  . WBC 09/07/2014 4.7  3.6 - 11.0 K/uL Final  . RBC 09/07/2014 3.67* 3.80 - 5.20 MIL/uL Final  . Hemoglobin 09/07/2014 12.3  12.0 - 16.0 g/dL Final  . HCT 09/07/2014 36.3  35.0 - 47.0 % Final  . MCV 09/07/2014 98.9  80.0 - 100.0 fL Final  . MCH 09/07/2014 33.7  26.0 - 34.0 pg Final  . MCHC 09/07/2014 34.0  32.0 - 36.0 g/dL Final  . RDW 09/07/2014 22.7* 11.5 - 14.5 % Final  . Platelets 09/07/2014 189  150 - 440 K/uL Final  . Neutrophils Relative % 09/07/2014 67   Final  . Neutro Abs 09/07/2014 3.1  1.4 - 6.5 K/uL Final  . Lymphocytes Relative 09/07/2014 19   Final  . Lymphs Abs 09/07/2014 0.9* 1.0 - 3.6 K/uL Final   . Monocytes Relative 09/07/2014 10   Final  . Monocytes Absolute 09/07/2014 0.5  0.2 - 0.9 K/uL Final  . Eosinophils Relative 09/07/2014 3   Final  . Eosinophils Absolute 09/07/2014 0.1  0 - 0.7 K/uL Final  . Basophils Relative 09/07/2014 1   Final  . Basophils Absolute 09/07/2014 0.0  0 - 0.1 K/uL Final  . Sodium 09/07/2014 138  135 - 145 mmol/L Final  . Potassium 09/07/2014 3.3* 3.5 - 5.1 mmol/L Final  . Chloride 09/07/2014 110  101 - 111 mmol/L Final  . CO2 09/07/2014 26  22 - 32 mmol/L Final  . Glucose, Bld 09/07/2014 91  65 - 99 mg/dL Final  . BUN 09/07/2014 <5* 6 - 20 mg/dL Final  . Creatinine, Ser 09/07/2014 0.90  0.44 - 1.00 mg/dL Final  . Calcium 09/07/2014 7.9* 8.9 - 10.3 mg/dL Final  . Total Protein 09/07/2014 6.8  6.5 - 8.1 g/dL Final  . Albumin 09/07/2014 3.0* 3.5 - 5.0 g/dL Final  . AST 09/07/2014 33  15 - 41 U/L Final  . ALT 09/07/2014 15  14 - 54 U/L Final  . Alkaline Phosphatase 09/07/2014 62  38 - 126 U/L Final  . Total Bilirubin 09/07/2014 0.4  0.3 - 1.2 mg/dL Final  . GFR calc non Af Amer 09/07/2014 >60  >60 mL/min Final  . GFR calc Af Amer 09/07/2014 >60  >60 mL/min Final   Comment: (NOTE) The eGFR has been calculated using the CKD EPI equation. This calculation has not been validated in all clinical situations. eGFR's persistently <60 mL/min signify possible Chronic Kidney Disease.   Georgiann Hahn gap 09/07/2014 2* 5 - 15 Final    Assessment:  [name] is a 59 y.o. 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  Chest, abdomen, and pelvic CT scan on 05/19/2014 revealed no evidence of metastatic disease.  There was a 4 mm RUL pulmonary nodule.  There were borderline (13 mm) enlarged iliac nodes.  There was a 4.7 x 4.1 x 6.0 cm lower rectal and anal mass consistent with anal cancer.  She underwent diverting  colostomy on 05/26/2014.    She is currently day 64 of cycle #2 concurrent chemotherapy (5FU and mitomycin-C) and radiation (06/29/2014 - 07/31/2014).  She missed 3 days of radiation (07/04-07/06/2014).  She has been on a radiation break since 08/23/2014 secondary to perirectal breakdown.  Cycle #2 was complicated by moderate thrombocytopenia.  Counts have recovered.  She has new liquid stool. She has mild hypokalemia.  She ran out of her potassium supplementation.  Plan: 1.  Labs today:  CBC with diff, CMP. 2.  Stool for culture, ova and parasites and C. difficile. 3.  Refill Percocet 10/325 1 q 6 hours prn pain; dis #40. 4.  Rx: potassium 20 meq BID a 3 days then 20 meq a day. 5.  RTC in 1 week for labs (BMP and Mg). 6.  RTC in 2 weeks for MD assess and labs (CBC with diff, BMP).   Lequita Asal, MD  09/07/2014, 3:40 PM

## 2014-09-08 ENCOUNTER — Ambulatory Visit: Payer: Commercial Managed Care - HMO | Admitting: Surgery

## 2014-09-08 ENCOUNTER — Ambulatory Visit: Payer: Commercial Managed Care - HMO

## 2014-09-08 ENCOUNTER — Other Ambulatory Visit: Payer: Self-pay | Admitting: *Deleted

## 2014-09-08 NOTE — Patient Outreach (Signed)
Follow up phone call:  Attempt made to contact pt, check on status as last scheduled home visit (no show).    HIPPA compliant voice message left with contact number.  If no response, plan to try again next week.     [name]   [facility]  [phone]

## 2014-09-11 ENCOUNTER — Encounter: Payer: Self-pay | Admitting: Hematology and Oncology

## 2014-09-12 ENCOUNTER — Ambulatory Visit: Payer: Commercial Managed Care - HMO

## 2014-09-12 ENCOUNTER — Encounter: Payer: Commercial Managed Care - HMO | Attending: Surgery | Admitting: Surgery

## 2014-09-12 DIAGNOSIS — K627 Radiation proctitis: Secondary | ICD-10-CM | POA: Diagnosis not present

## 2014-09-12 DIAGNOSIS — F329 Major depressive disorder, single episode, unspecified: Secondary | ICD-10-CM | POA: Insufficient documentation

## 2014-09-12 DIAGNOSIS — J449 Chronic obstructive pulmonary disease, unspecified: Secondary | ICD-10-CM | POA: Diagnosis not present

## 2014-09-12 DIAGNOSIS — L98412 Non-pressure chronic ulcer of buttock with fat layer exposed: Secondary | ICD-10-CM | POA: Diagnosis present

## 2014-09-12 DIAGNOSIS — I10 Essential (primary) hypertension: Secondary | ICD-10-CM | POA: Diagnosis not present

## 2014-09-12 DIAGNOSIS — C44520 Squamous cell carcinoma of anal skin: Secondary | ICD-10-CM | POA: Insufficient documentation

## 2014-09-12 DIAGNOSIS — F17218 Nicotine dependence, cigarettes, with other nicotine-induced disorders: Secondary | ICD-10-CM | POA: Diagnosis not present

## 2014-09-13 ENCOUNTER — Encounter: Payer: Commercial Managed Care - HMO | Admitting: Surgery

## 2014-09-13 ENCOUNTER — Inpatient Hospital Stay: Payer: Commercial Managed Care - HMO

## 2014-09-13 ENCOUNTER — Ambulatory Visit: Payer: Commercial Managed Care - HMO

## 2014-09-13 ENCOUNTER — Other Ambulatory Visit: Payer: Self-pay | Admitting: *Deleted

## 2014-09-13 DIAGNOSIS — C210 Malignant neoplasm of anus, unspecified: Secondary | ICD-10-CM | POA: Diagnosis not present

## 2014-09-13 DIAGNOSIS — L98412 Non-pressure chronic ulcer of buttock with fat layer exposed: Secondary | ICD-10-CM | POA: Diagnosis not present

## 2014-09-13 DIAGNOSIS — R197 Diarrhea, unspecified: Secondary | ICD-10-CM

## 2014-09-13 DIAGNOSIS — E876 Hypokalemia: Secondary | ICD-10-CM

## 2014-09-13 LAB — BASIC METABOLIC PANEL
Anion gap: 3 — ABNORMAL LOW (ref 5–15)
BUN: <5 mg/dL — ABNORMAL LOW (ref 6–20)
CO2: 27 mmol/L (ref 22–32)
Calcium: 8.6 mg/dL — ABNORMAL LOW (ref 8.9–10.3)
Chloride: 107 mmol/L (ref 101–111)
Creatinine, Ser: 0.63 mg/dL (ref 0.44–1.00)
GFR calc Af Amer: >60 mL/min (ref >60)
GFR calc non Af Amer: >60 mL/min (ref >60)
Glucose, Bld: 76 mg/dL (ref 65–99)
Potassium: 4 mmol/L (ref 3.5–5.1)
Sodium: 137 mmol/L (ref 135–145)

## 2014-09-13 LAB — MAGNESIUM: Magnesium: 2 mg/dL (ref 1.7–2.4)

## 2014-09-13 NOTE — Progress Notes (Signed)
INAARA, TYE ([id number]) Visit Report for 09/12/2014 Arrival Information Details Patient Name: [name], [name]. Date of Service: 09/12/2014 4:00 PM Medical Record Number: [id number] Patient Account Number: [id number] Date of Birth/Sex: July 26, 1955 (59 y.o. Female) Treating RN: Cornell Barman Primary Care Physician: Clayborn Bigness Other Clinician: Referring Physician: Clayborn Bigness Treating Physician/Extender: Frann Rider in Treatment: 3 Visit Information History Since Last Visit Added or deleted any medications: No Patient Arrived: Ambulatory Any new allergies or adverse reactions: No Arrival Time: 15:55 Had a fall or experienced change in No Accompanied By: self activities of daily living that may affect Transfer Assistance: Manual risk of falls: Patient Identification Verified: Yes Signs or symptoms of abuse/neglect since last No Secondary Verification Process Yes visito Completed: Hospitalized since last visit: No Patient Requires Transmission-Based No Has Dressing in Place as Prescribed: Yes Precautions: Pain Present Now: No Patient Has Alerts: No Electronic Signature(s) Signed: 09/12/2014 5:22:53 PM By: Gretta Cool, RN, BSN, Kim RN, BSN Entered By: Gretta Cool, RN, BSN, Kim on 09/12/2014 15:58:16 [name] ([id number]) -------------------------------------------------------------------------------- Clinic Level of Care Assessment Details Patient Name: [name], [name] Date of Service: 09/12/2014 4:00 PM Medical Record Number: [id number] Patient Account Number: [id number] Date of Birth/Sex: [birth year] (59 y.o. Female) Treating RN: Cornell Barman Primary Care Physician: Clayborn Bigness Other Clinician: Referring Physician: Clayborn Bigness Treating Physician/Extender: Frann Rider in Treatment: 3 Clinic Level of Care Assessment Items TOOL 4 Quantity Score '[]'$  - Use when only an EandM is performed on FOLLOW-UP visit 0 ASSESSMENTS - Nursing Assessment / Reassessment X -  Reassessment of Co-morbidities (includes updates in patient status) 1 10 X - Reassessment of Adherence to Treatment Plan 1 5 ASSESSMENTS - Wound and Skin Assessment / Reassessment X - Simple Wound Assessment / Reassessment - one wound 1 5 '[]'$  - Complex Wound Assessment / Reassessment - multiple wounds 0 '[]'$  - Dermatologic / Skin Assessment (not related to wound area) 0 ASSESSMENTS - Focused Assessment '[]'$  - Circumferential Edema Measurements - multi extremities 0 '[]'$  - Nutritional Assessment / Counseling / Intervention 0 '[]'$  - Lower Extremity Assessment (monofilament, tuning fork, pulses) 0 '[]'$  - Peripheral Arterial Disease Assessment (using hand held doppler) 0 ASSESSMENTS - Ostomy and/or Continence Assessment and Care '[]'$  - Incontinence Assessment and Management 0 '[]'$  - Ostomy Care Assessment and Management (repouching, etc.) 0 PROCESS - Coordination of Care X - Simple Patient / Family Education for ongoing care 1 15 '[]'$  - Complex (extensive) Patient / Family Education for ongoing care 0 '[]'$  - Staff obtains Programmer, systems, Records, Test Results / Process Orders 0 '[]'$  - Staff telephones HHA, Nursing Homes / Clarify orders / etc 0 '[]'$  - Routine Transfer to another Facility (non-emergent condition) 0 [name], [name] ([id number]) '[]'$  - Routine Hospital Admission (non-emergent condition) 0 '[]'$  - New Admissions / Biomedical engineer / Ordering NPWT, Apligraf, etc. 0 '[]'$  - Emergency Hospital Admission (emergent condition) 0 X - Simple Discharge Coordination 1 10 '[]'$  - Complex (extensive) Discharge Coordination 0 PROCESS - Special Needs '[]'$  - Pediatric / Minor Patient Management 0 '[]'$  - Isolation Patient Management 0 '[]'$  - Hearing / Language / Visual special needs 0 '[]'$  - Assessment of Community assistance (transportation, D/C planning, etc.) 0 '[]'$  - Additional assistance / Altered mentation 0 '[]'$  - Support Surface(s) Assessment (bed, cushion, seat, etc.) 0 INTERVENTIONS - Wound Cleansing / Measurement X -  Simple Wound Cleansing - one wound 1 5 '[]'$  - Complex Wound Cleansing - multiple wounds 0 X - Wound Imaging (photographs - any number of  wounds) 1 5 '[]'$  - Wound Tracing (instead of photographs) 0 X - Simple Wound Measurement - one wound 1 5 '[]'$  - Complex Wound Measurement - multiple wounds 0 INTERVENTIONS - Wound Dressings X - Small Wound Dressing one or multiple wounds 1 10 '[]'$  - Medium Wound Dressing one or multiple wounds 0 '[]'$  - Large Wound Dressing one or multiple wounds 0 '[]'$  - Application of Medications - topical 0 '[]'$  - Application of Medications - injection 0 INTERVENTIONS - Miscellaneous '[]'$  - External ear exam 0 [name], [name] ([id number]) '[]'$  - Specimen Collection (cultures, biopsies, blood, body fluids, etc.) 0 '[]'$  - Specimen(s) / Culture(s) sent or taken to Lab for analysis 0 '[]'$  - Patient Transfer (multiple staff / Harrel Lemon Lift / Similar devices) 0 '[]'$  - Simple Staple / Suture removal (25 or less) 0 '[]'$  - Complex Staple / Suture removal (26 or more) 0 '[]'$  - Hypo / Hyperglycemic Management (close monitor of Blood Glucose) 0 '[]'$  - Ankle / Brachial Index (ABI) - do not check if billed separately 0 X - Vital Signs 1 5 Has the patient been seen at the hospital within the last three years: Yes Total Score: 75 Level Of Care: New/Established - Level 2 Electronic Signature(s) Signed: 09/12/2014 5:22:53 PM By: Gretta Cool, RN, BSN, Kim RN, BSN Entered By: Gretta Cool, RN, BSN, Kim on 09/12/2014 16:17:11 [name] ([id number]) -------------------------------------------------------------------------------- Encounter Discharge Information Details Patient Name: Tracy Picket P. Date of Service: 09/12/2014 4:00 PM Medical Record Number: [id number] Patient Account Number: [id number] Date of Birth/Sex: 25-Jul-1955 (59 y.o. Female) Treating RN: Cornell Barman Primary Care Physician: Clayborn Bigness Other Clinician: Referring Physician: Clayborn Bigness Treating Physician/Extender: Frann Rider in  Treatment: 3 Encounter Discharge Information Items Discharge Pain Level: 0 Discharge Condition: Stable Ambulatory Status: Ambulatory Discharge Destination: Home Private Transportation: Auto Accompanied By: self Schedule Follow-up Appointment: Yes Medication Reconciliation completed and Yes provided to Patient/Care Quince Santana: Clinical Summary of Care: Electronic Signature(s) Signed: 09/12/2014 5:22:53 PM By: Gretta Cool, RN, BSN, Kim RN, BSN Entered By: Gretta Cool, RN, BSN, Kim on 09/12/2014 16:18:03 [name] ([id number]) -------------------------------------------------------------------------------- Multi Wound Chart Details Patient Name: Tracy Picket P. Date of Service: 09/12/2014 4:00 PM Medical Record Number: [id number] Patient Account Number: [id number] Date of Birth/Sex: Aug 03, 1955 (59 y.o. Female) Treating RN: Cornell Barman Primary Care Physician: Clayborn Bigness Other Clinician: Referring Physician: Clayborn Bigness Treating Physician/Extender: Frann Rider in Treatment: 3 Vital Signs Height(in): 68 Pulse(bpm): 93 Weight(lbs): 183 Blood Pressure 119/97 (mmHg): Body Mass Index(BMI): 28 Temperature(F): 98.4 Respiratory Rate 18 (breaths/min): Photos: [1:No Photos] [N/A:N/A] Wound Location: [1:Right Gluteus - Medial] [N/A:N/A] Wounding Event: [1:Radiation Burn] [N/A:N/A] Primary Etiology: [1:Necrosis (Radiation)] [N/A:N/A] Comorbid History: [1:Chronic Obstructive Pulmonary Disease (COPD), Hypertension, Received Radiation] [N/A:N/A] Date Acquired: [1:05/24/2014] [N/A:N/A] Weeks of Treatment: [1:3] [N/A:N/A] Wound Status: [1:Open] [N/A:N/A] Measurements L x W x D 1.5x2.2x0.4 [N/A:N/A] (cm) Area (cm) : [1:2.592] [N/A:N/A] Volume (cm) : [1:1.037] [N/A:N/A] % Reduction in Area: [1:55.40%] [N/A:N/A] % Reduction in Volume: 10.80% [N/A:N/A] Classification: [1:Full Thickness Without Exposed Support Structures] [N/A:N/A] Exudate Amount: [1:Medium] [N/A:N/A] Exudate  Type: [1:Serosanguineous] [N/A:N/A] Exudate Color: [1:red, brown] [N/A:N/A] Foul Odor After [1:Yes] [N/A:N/A] Cleansing: Odor Anticipated Due to No [N/A:N/A] Product Use: Wound Margin: [1:Distinct, outline attached] [N/A:N/A] Granulation Amount: [1:Large (67-100%)] [N/A:N/A] Granulation Quality: [1:Red] [N/A:N/A] Necrotic Amount: None Present (0%) N/A N/A Exposed Structures: Fascia: No N/A N/A Fat: No Tendon: No Muscle: No Joint: No Bone: No Limited to Skin Breakdown Epithelialization: None N/A N/A Periwound Skin Texture: Edema: No N/A N/A Excoriation: No  Induration: No Callus: No Crepitus: No Fluctuance: No Friable: No Rash: No Scarring: No Periwound Skin Moist: Yes N/A N/A Moisture: Maceration: No Dry/Scaly: No Periwound Skin Color: Atrophie Blanche: No N/A N/A Cyanosis: No Ecchymosis: No Erythema: No Hemosiderin Staining: No Mottled: No Pallor: No Rubor: No Temperature: No Abnormality N/A N/A Tenderness on Yes N/A N/A Palpation: Wound Preparation: Ulcer Cleansing: N/A N/A Rinsed/Irrigated with Saline Topical Anesthetic Applied: Other: lidocaine 4% Treatment Notes Electronic Signature(s) Signed: 09/12/2014 5:22:53 PM By: Gretta Cool, RN, BSN, Kim RN, BSN Entered By: Gretta Cool, RN, BSN, Kim on 09/12/2014 16:07:49 [name] ([id number]) -------------------------------------------------------------------------------- Multi-Disciplinary Care Plan Details Patient Name: [name]. Date of Service: 09/12/2014 4:00 PM Medical Record Number: [id number] Patient Account Number: [id number] Date of Birth/Sex: 1955-10-24 (59 y.o. Female) Treating RN: Cornell Barman Primary Care Physician: Clayborn Bigness Other Clinician: Referring Physician: Clayborn Bigness Treating Physician/Extender: Frann Rider in Treatment: 3 Active Inactive Abuse / Safety / Falls / Self Care Management Nursing Diagnoses: Impaired physical mobility Potential for falls Self care deficit:  actual or potential Goals: Patient will remain injury free Date Initiated: 08/17/2014 Goal Status: Active Patient/caregiver will verbalize understanding of skin care regimen Date Initiated: 08/17/2014 Goal Status: Active Patient/caregiver will verbalize/demonstrate measure taken to improve self care Date Initiated: 08/17/2014 Goal Status: Active Patient/caregiver will verbalize/demonstrate measures taken to improve the patient's personal safety Date Initiated: 08/17/2014 Goal Status: Active Patient/caregiver will verbalize/demonstrate measures taken to prevent injury and/or falls Date Initiated: 08/17/2014 Goal Status: Active Patient/caregiver will verbalize/demonstrate understanding of what to do in case of emergency Date Initiated: 08/17/2014 Goal Status: Active Interventions: Assess fall risk on admission and as needed Assess: immobility, friction, shearing, incontinence upon admission and as needed Assess impairment of mobility on admission and as needed per policy Assess self care needs on admission and as needed Provide education on basic hygiene Provide education on fall prevention Provide education on personal and home safety [name], [name] ([id number]) Provide education on safe transfers Treatment Activities: Education provided on Basic Hygiene : 08/17/2014 Notes: Orientation to the Wound Care Program Nursing Diagnoses: Knowledge deficit related to the wound healing center program Goals: Patient/caregiver will verbalize understanding of the [facility] Date Initiated: 08/17/2014 Goal Status: Active Interventions: Provide education on orientation to the wound center Notes: Wound/Skin Impairment Nursing Diagnoses: Impaired tissue integrity Knowledge deficit related to smoking impact on wound healing Knowledge deficit related to ulceration/compromised skin integrity Goals: Patient/caregiver will verbalize understanding of skin care regimen Date  Initiated: 08/17/2014 Goal Status: Active Ulcer/skin breakdown will have a volume reduction of 30% by week 4 Date Initiated: 08/17/2014 Goal Status: Active Ulcer/skin breakdown will have a volume reduction of 50% by week 8 Date Initiated: 08/17/2014 Goal Status: Active Ulcer/skin breakdown will have a volume reduction of 80% by week 12 Date Initiated: 08/17/2014 Goal Status: Active Ulcer/skin breakdown will heal within 14 weeks Date Initiated: 08/17/2014 Goal Status: Active Interventions: [name], [name] ([id number]) Assess patient/caregiver ability to obtain necessary supplies Assess patient/caregiver ability to perform ulcer/skin care regimen upon admission and as needed Assess ulceration(s) every visit Provide education on smoking Provide education on ulcer and skin care Notes: Electronic Signature(s) Signed: 09/12/2014 5:22:53 PM By: Gretta Cool, RN, BSN, Kim RN, BSN Entered By: Gretta Cool, RN, BSN, Kim on 09/12/2014 16:05:25 [name], [name] ([id number]) -------------------------------------------------------------------------------- Pain Assessment Details Patient Name: Tracy Picket P. Date of Service: 09/12/2014 4:00 PM Medical Record Number: [id number] Patient Account Number: [id number] Date of Birth/Sex: 10-04-1955 (59 y.o. Female) Treating RN: Cornell Barman  Primary Care Physician: Clayborn Bigness Other Clinician: Referring Physician: Clayborn Bigness Treating Physician/Extender: Frann Rider in Treatment: 3 Active Problems Location of Pain Severity and Description of Pain Patient Has Paino Yes Site Locations Pain Location: Pain in Ulcers With Dressing Change: Yes Duration of the Pain. Constant / Intermittento Intermittent Rate the pain. Current Pain Level: 4 Worst Pain Level: 10 Least Pain Level: 1 Character of Pain Describe the Pain: Other: uncomfortable pressure Pain Management and Medication Current Pain Management: Electronic Signature(s) Signed: 09/12/2014 5:22:53  PM By: Gretta Cool, RN, BSN, Kim RN, BSN Entered By: Gretta Cool, RN, BSN, Kim on 09/12/2014 15:58:21 Pardon, [name] ([id number]) -------------------------------------------------------------------------------- Patient/Caregiver Education Details Patient Name: [name]. Date of Service: 09/12/2014 4:00 PM Medical Record Number: [id number] Patient Account Number: [id number] Date of Birth/Gender: 1955/10/05 (59 y.o. Female) Treating RN: Cornell Barman Primary Care Physician: Clayborn Bigness Other Clinician: Referring Physician: Clayborn Bigness Treating Physician/Extender: Frann Rider in Treatment: 3 Education Assessment Education Provided To: Patient Education Topics Provided Hyperbaric Oxygenation: Handouts: Hyperbaric Oxygen, Other: Begin HBO treatments Methods: Demonstration, Explain/Verbal Responses: State content correctly Wound/Skin Impairment: Handouts: Caring for Your Ulcer, Other: continue wound care as prescribed Electronic Signature(s) Signed: 09/12/2014 5:22:53 PM By: Gretta Cool, RN, BSN, Kim RN, BSN Entered By: Gretta Cool, RN, BSN, Kim on 09/12/2014 16:18:44 Tingley, [name] ([id number]) -------------------------------------------------------------------------------- Wound Assessment Details Patient Name: [name], [name] Date of Service: 09/12/2014 4:00 PM Medical Record Number: [id number] Patient Account Number: [id number] Date of Birth/Sex: 16-Aug-1955 (59 y.o. Female) Treating RN: Cornell Barman Primary Care Physician: Clayborn Bigness Other Clinician: Referring Physician: Clayborn Bigness Treating Physician/Extender: Frann Rider in Treatment: 3 Wound Status Wound Number: 1 Primary Necrosis (Radiation) Etiology: Wound Location: Right Gluteus - Medial Wound Open Wounding Event: Radiation Burn Status: Date Acquired: 05/24/2014 Comorbid Chronic Obstructive Pulmonary Weeks Of Treatment: 3 History: Disease (COPD), Hypertension, Clustered Wound: No Received  Radiation Photos Photo Uploaded By: Gretta Cool, RN, BSN, Kim on 09/12/2014 16:26:44 Wound Measurements Length: (cm) 1.5 Width: (cm) 2.2 Depth: (cm) 0.4 Area: (cm) 2.592 Volume: (cm) 1.037 % Reduction in Area: 55.4% % Reduction in Volume: 10.8% Epithelialization: None Wound Description Full Thickness Without Exposed Classification: Support Structures Wound Margin: Distinct, outline attached Exudate Medium Amount: Exudate Type: Serosanguineous Exudate Color: red, brown Foul Odor After Cleansing: Yes Due to Product Use: No Wound Bed Granulation Amount: Large (67-100%) Exposed Structure Granulation Quality: Red Fascia Exposed: No [name], [name] ([id number]) Necrotic Amount: None Present (0%) Fat Layer Exposed: No Tendon Exposed: No Muscle Exposed: No Joint Exposed: No Bone Exposed: No Limited to Skin Breakdown Periwound Skin Texture Texture Color No Abnormalities Noted: No No Abnormalities Noted: No Callus: No Atrophie Blanche: No Crepitus: No Cyanosis: No Excoriation: No Ecchymosis: No Fluctuance: No Erythema: No Friable: No Hemosiderin Staining: No Induration: No Mottled: No Localized Edema: No Pallor: No Rash: No Rubor: No Scarring: No Temperature / Pain Moisture Temperature: No Abnormality No Abnormalities Noted: No Tenderness on Palpation: Yes Dry / Scaly: No Maceration: No Moist: Yes Wound Preparation Ulcer Cleansing: Rinsed/Irrigated with Saline Topical Anesthetic Applied: Other: lidocaine 4%, Treatment Notes Wound #1 (Right, Medial Gluteus) 1. Cleansed with: Clean wound with Normal Saline 2. Anesthetic Topical Lidocaine 4% cream to wound bed prior to debridement 4. Dressing Applied: Aquacel Ag Hydrocolloid Wound Dressing 5. Secondary Dressing Applied Bordered Foam Dressing Notes stomahesive and hydrocolloid used to windowpain periwound skin Electronic Signature(s) Signed: 09/12/2014 5:22:53 PM By: Gretta Cool, RN, BSN, Kim RN,  BSN Entered By: Gretta Cool, RN, BSN, Kim on 09/12/2014 16:03:00  [name], [name] ([id number], [name] ([id number]) -------------------------------------------------------------------------------- Vitals Details Patient Name: TOSHIA, LARKIN. Date of Service: 09/12/2014 4:00 PM Medical Record Number: [id number] Patient Account Number: [id number] Date of Birth/Sex: 1955/09/11 (59 y.o. Female) Treating RN: Cornell Barman Primary Care Physician: Clayborn Bigness Other Clinician: Referring Physician: Clayborn Bigness Treating Physician/Extender: Frann Rider in Treatment: 3 Vital Signs Time Taken: 15:58 Temperature (F): 98.4 Height (in): 68 Pulse (bpm): 93 Weight (lbs): 183 Respiratory Rate (breaths/min): 18 Body Mass Index (BMI): 27.8 Blood Pressure (mmHg): 119/97 Reference Range: 80 - 120 mg / dl Electronic Signature(s) Signed: 09/12/2014 5:22:53 PM By: Gretta Cool, RN, BSN, Kim RN, BSN Entered By: Gretta Cool, RN, BSN, Kim on 09/12/2014 15:58:53

## 2014-09-13 NOTE — Progress Notes (Signed)
[name] ([id number]) Visit Report for 09/12/2014 HPI Details Patient Name: [name], [name]. Date of Service: 09/12/2014 4:00 PM Medical Record Number: [id number] Patient Account Number: [id number] Date of Birth/Sex: 15-Mar-1955 (59 y.o. Female) Treating RN: Cornell Barman Primary Care Physician: Clayborn Bigness Other Clinician: Referring Physician: Clayborn Bigness Treating Physician/Extender: Frann Rider in Treatment: 3 History of Present Illness Location: anal region and right gluteal area Quality: Patient reports experiencing a sharp pain to affected area(s). Severity: Patient states wound are getting worse. Duration: Patient has had the wound for < 2 weeks prior to presenting for treatment Timing: Pain in wound is constant (hurts all the time) Context: The wound occurred when the patient has been receiving radiation therapy for an anal cancer Modifying Factors: Other treatment(s) tried include:he has been using Silvadene ointment over the wound Associated Signs and Symptoms: Patient reports having difficulty sitting for long periods. 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 She is receiving cycles of 5-FU and mitomycin-C with radiation. She has been sent to [name] for wound care for addition proctitis and a ulcerated area on the left gluteal region and the perianal vicinity. She tells me that her chemotherapy has been completed last week but she has taken a break from radiation therapy. She is also having some bleeding per rectum and some excoriation of skin around her low back and has significant symptoms of radiation proctitis. Past medical history significant for schizophrenia, asthma, GERD, anxiety depression, bipolar disorder, COPD, PTSD, DVT, peripheral neuropathy, anemia, obstructive sleep apnea. Past surgical history includes recent rectal biopsy, laparoscopic  diverting colostomy, hemorrhoidectomy, Port-A-Cath placement. 08/24/2014 -- I spoke to Dr. Lorenda Cahill the medical oncologist on 08/18/14 and she did confirm that the patient has completed her course of chemotherapy and will not be receiving any chemotherapy now or in the near future. Dr. Donella Stade the radiation oncologist is away and I will talk to him on Monday and if he has no objections the patient will probably benefit from hyperbaric oxygen therapy. Radiation therapy had been started on 06/29/2014 and after confirming the present schedule for radiation we can then think about treating the patient with hyperbaric oxygen therapy once it has been discussed with Dr. Donella Stade. On 08/22/2014 I spoke to Dr. Donella Stade who confirmed that the patientos tumor was fairly large and after giving her radiation she has had a resulting open wound from the area where the tumor has shrunk. At this stage he has decided to stop the radiation therapy as he has achieved the desired result and would like me to go ahead with treating her for the late effects of radiation. 09/12/2014 -- for various social reasons the patient has missed her appointments since 08/24/2014. She is doing fine otherwise and seems to have recently seen a medical oncologist Dr. Susy Manor. Tracy, [name] ([id number]) Electronic Signature(s) Signed: 09/12/2014 4:15:33 PM By: Christin Fudge MD, FACS Previous Signature: 09/12/2014 4:14:42 PM Version By: Christin Fudge MD, FACS Entered By: Christin Fudge on 09/12/2014 16:15:32 [name] ([id number]) -------------------------------------------------------------------------------- Physical Exam Details Patient Name: [name], [name] Date of Service: 09/12/2014 4:00 PM Medical Record Number: [id number] Patient Account Number: [id number] Date of Birth/Sex: 1955/10/28 (59 y.o. Female) Treating RN: Cornell Barman Primary Care Physician: Clayborn Bigness Other Clinician: Referring Physician: Clayborn Bigness Treating Physician/Extender: Frann Rider in Treatment: 3 Constitutional . Pulse regular. Respirations normal and unlabored. Afebrile. . Eyes Nonicteric. Reactive  to light. Ears, Nose, Mouth, and Throat Lips, teeth, and gums WNL.Marland Kitchen Moist mucosa without lesions . Neck supple and nontender. No palpable supraclavicular or cervical adenopathy. Normal sized without goiter. Respiratory WNL. No retractions.. Cardiovascular Pedal Pulses WNL. No clubbing, cyanosis or edema. Genitourinary (GU) No hydrocele, spermatocele, tenderness of the cord, or testicular mass.Marland Kitchen Penis without lesions.Lowella Fairy without lesions. No cystocele, or rectocele. Pelvic support intact, no discharge. Marland Kitchen Urethra without masses, tenderness or scarring.Marland Kitchen Lymphatic No adneopathy. No adenopathy. No adenopathy. Musculoskeletal Adexa without tenderness or enlargement.. Digits and nails w/o clubbing, cyanosis, infection, petechiae, ischemia, or inflammatory conditions.. Integumentary (Hair, Skin) No suspicious lesions. No crepitus or fluctuance. No peri-wound warmth or erythema. No masses.Marland Kitchen Psychiatric Judgement and insight Intact.. No evidence of depression, anxiety, or agitation.. Notes The patient's wound in the right perineal area is fairly clean but very tender to touch and she has evidence of radiation proctitis and soft tissue changes of radiation. Electronic Signature(s) Signed: 09/12/2014 4:17:21 PM By: Christin Fudge MD, FACS Entered By: Christin Fudge on 09/12/2014 16:17:20 [name] ([id number]) -------------------------------------------------------------------------------- Physician Orders Details Patient Name: [name]. Date of Service: 09/12/2014 4:00 PM Medical Record Number: [id number] Patient Account Number: [id number] Date of Birth/Sex: 1955-02-22 (59 y.o. Female) Treating RN: Cornell Barman Primary Care Physician: Clayborn Bigness Other Clinician: Referring Physician: Clayborn Bigness Treating Physician/Extender: Frann Rider in Treatment: 3 Verbal / Phone Orders: Yes Clinician: Cornell Barman Read Back and Verified: Yes Diagnosis Coding Wound Cleansing Wound #1 Right,Medial Gluteus o Clean wound with Normal Saline. o Cleanse wound with mild soap and water o May Shower, gently pat wound dry prior to applying new dressing. Anesthetic Wound #1 Right,Medial Gluteus o Topical Lidocaine 4% cream applied to wound bed prior to debridement Skin Barriers/Peri-Wound Care Wound #1 Right,Medial Gluteus o Skin Prep Primary Wound Dressing Wound #1 Right,Medial Gluteus o Prisma Ag Secondary Dressing Wound #1 Right,Medial Gluteus o Boardered Foam Dressing o Hydrocolloid - windowpain wound with hyrocolloid to help boardered foam stick better; eaken seal Dressing Change Frequency Wound #1 Right,Medial Gluteus o Change Dressing Monday, Wednesday, Friday - HHRN please add PRN visit this weekend for dressing change Follow-up Appointments Wound #1 Right,Medial Gluteus o Return Appointment in 1 week. Hyperbaric Oxygen Therapy Wound #1 Right,Medial Gluteus o Evaluate for HBO Therapy TACARRA, JUSTO. ([id number]) o Indication: - radiation o 2.0 ATA for 90 Minutes without Air Breaks o One treatment per day (delivered Monday through Friday unless otherwise specified in Special Instructions below): o Total # of Treatments: - 40 HBO Contraindications Wound #1 Right,Medial Gluteus - Other Extremity - rectum o HBO contraindications of hyperbaric oxygen therapy were reviewed and the patient found to have no untreated pneumothorax or history of spontaneous pneumothorax. o HBO contraindications of hyperbaric oxygen therapy were reviewed and the patient found to have no history of medications such as Bleomycin, Adriamycin, disulfiram, cisplatin and sulfamylon and is not currently receiving any chemotherapy. o HBO contraindications of  hyperbaric oxygen therapy were reviewed and the patient found to have no Upper respiratory infection and chronic sinusitis. o HBO contraindications of hyperbaric oxygen therapy were reviewed and the patient found to have no history of retinal surgery proceeding 6 weeks or intraocular gas o HBO contraindications of hyperbaric oxygen therapy were reviewed and the patient found to have no history seizure disorder or any anticonvulsant medication. o HBO contraindications of hyperbaric oxygen therapy were reviewed and the patient found to have no septicemia with CO2 retention. o HBO contraindications of hyperbaric  oxygen therapy were reviewed and the patient found to have no fever greater than 100 degrees. o HBO contraindications of hyperbaric oxygen therapy were reviewed and the patient found to have no pregnancy noted. o HBO contraindications of hyperbaric oxygen therapy were reviewed and the patient found to have no medications such as steroids or narcotics or Phenergan. Electronic Signature(s) Signed: 09/12/2014 4:40:03 PM By: Christin Fudge MD, FACS Signed: 09/12/2014 5:22:53 PM By: Gretta Cool RN, BSN, Kim RN, BSN Entered By: Gretta Cool, RN, BSN, Kim on 09/12/2014 16:12:05 ANASTAZJA, ISAAC ([id number]) -------------------------------------------------------------------------------- Progress Note Details Patient Name: Kelso, Keeleigh P. Date of Service: 09/12/2014 4:00 PM Medical Record Number: [id number] Patient Account Number: [id number] Date of Birth/Sex: 10/19/1955 (59 y.o. Female) Treating RN: Cornell Barman Primary Care Physician: Clayborn Bigness Other Clinician: Referring Physician: Clayborn Bigness Treating Physician/Extender: Frann Rider in Treatment: 3 Subjective History of Present Illness (HPI) The following HPI elements were documented for the patient's wound: Location: anal region and right gluteal area Quality: Patient reports experiencing a sharp pain to affected  area(s). Severity: Patient states wound are getting worse. Duration: Patient has had the wound for < 2 weeks prior to presenting for treatment Timing: Pain in wound is constant (hurts all the time) Context: The wound occurred when the patient has been receiving radiation therapy for an anal cancer Modifying Factors: Other treatment(s) tried include:he has been using Silvadene ointment over the wound Associated Signs and Symptoms: Patient reports having difficulty sitting for long periods. 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 She is receiving cycles of 5-FU and mitomycin-C with radiation. She has been sent to [name] for wound care for addition proctitis and a ulcerated area on the left gluteal region and the perianal vicinity. She tells me that her chemotherapy has been completed last week but she has taken a break from radiation therapy. She is also having some bleeding per rectum and some excoriation of skin around her low back and has significant symptoms of radiation proctitis. Past medical history significant for schizophrenia, asthma, GERD, anxiety depression, bipolar disorder, COPD, PTSD, DVT, peripheral neuropathy, anemia, obstructive sleep apnea. Past surgical history includes recent rectal biopsy, laparoscopic diverting colostomy, hemorrhoidectomy, Port-A-Cath placement. 08/24/2014 -- I spoke to Dr. Lorenda Cahill the medical oncologist on 08/18/14 and she did confirm that the patient has completed her course of chemotherapy and will not be receiving any chemotherapy now or in the near future. Dr. Donella Stade the radiation oncologist is away and I will talk to him on Monday and if he has no objections the patient will probably benefit from hyperbaric oxygen therapy. Radiation therapy had been started on 06/29/2014 and after confirming the present schedule for radiation we can then think about  treating the patient with hyperbaric oxygen therapy once it has been discussed with Dr. Donella Stade. On 08/22/2014 I spoke to Dr. Donella Stade who confirmed that the patient s tumor was fairly large and after giving her radiation she has had a resulting open wound from the area where the tumor has shrunk. At this stage he has decided to stop the radiation therapy as he has achieved the desired result and would like me to go ahead with treating her for the late effects of radiation. 09/12/2014 -- for various social reasons the patient has missed her appointments since 08/24/2014. She is doing fine otherwise and seems to have recently seen a medical oncologist Dr. Susy Manor. MAO, LOCKNER ([id number]) Objective  Constitutional Pulse regular. Respirations normal and unlabored. Afebrile. Vitals Time Taken: 3:58 PM, Height: 68 in, Weight: 183 lbs, BMI: 27.8, Temperature: 98.4 F, Pulse: 93 bpm, Respiratory Rate: 18 breaths/min, Blood Pressure: 119/97 mmHg. Eyes Nonicteric. Reactive to light. Ears, Nose, Mouth, and Throat Lips, teeth, and gums WNL.Marland Kitchen Moist mucosa without lesions . Neck supple and nontender. No palpable supraclavicular or cervical adenopathy. Normal sized without goiter. Respiratory WNL. No retractions.. Cardiovascular Pedal Pulses WNL. No clubbing, cyanosis or edema. Genitourinary (GU) No hydrocele, spermatocele, tenderness of the cord, or testicular mass.Marland Kitchen Penis without lesions.Lowella Fairy without lesions. No cystocele, or rectocele. Pelvic support intact, no discharge. Marland Kitchen Urethra without masses, tenderness or scarring.Marland Kitchen Lymphatic No adneopathy. No adenopathy. No adenopathy. Musculoskeletal Adexa without tenderness or enlargement.. Digits and nails w/o clubbing, cyanosis, infection, petechiae, ischemia, or inflammatory conditions.Marland Kitchen Psychiatric Judgement and insight Intact.. No evidence of depression, anxiety, or agitation.. General Notes: The patient's wound in the right  perineal area is fairly clean but very tender to touch and she has evidence of radiation proctitis and soft tissue changes of radiation. Integumentary (Hair, Skin) Zollner, Laverle P. ([id number]) No suspicious lesions. No crepitus or fluctuance. No peri-wound warmth or erythema. No masses.. Wound #1 status is Open. Original cause of wound was Radiation Burn. The wound is located on the Right,Medial Gluteus. The wound measures 1.5cm length x 2.2cm width x 0.4cm depth; 2.592cm^2 area and 1.037cm^3 volume. The wound is limited to skin breakdown. There is a medium amount of serosanguineous drainage noted. The wound margin is distinct with the outline attached to the wound base. There is large (67-100%) red granulation within the wound bed. There is no necrotic tissue within the wound bed. The periwound skin appearance exhibited: Moist. The periwound skin appearance did not exhibit: Callus, Crepitus, Excoriation, Fluctuance, Friable, Induration, Localized Edema, Rash, Scarring, Dry/Scaly, Maceration, Atrophie Blanche, Cyanosis, Ecchymosis, Hemosiderin Staining, Mottled, Pallor, Rubor, Erythema. Periwound temperature was noted as No Abnormality. The periwound has tenderness on palpation. A chest x-ray and an EKG done at the end of July 2016 are both within normal limits and she has no other history of ENT problems or pulmonary problems. Assessment As far as a local wound goes I have recommended silver collagen and an appropriate foam dressing to help with her healing. The patient who has a squamous cell carcinoma of the anus has received chemotherapy and radiation and now is having problems with a open perineal wound with radiation proctitis. She has significant late effects of radiation and will benefit immensely from hyperbaric oxygen therapy. After discussing with her medical oncologist and her radiation oncologist I have recommended that she receive hyperbaric oxygen therapy to aid in her  recovery. They're both agreeable with the treatment plan. After discussing the risks benefits and alternatives she is agreeable and we will proceed with getting a workup done and applying for insurance clearance. She will commence hyperbaric oxygen therapy as soon as possible. Plan Wound Cleansing: Wound #1 Right,Medial Gluteus: Clean wound with Normal Saline. Cleanse wound with mild soap and water May Shower, gently pat wound dry prior to applying new dressing. Anesthetic: Wound #1 Right,Medial Gluteus: Topical Lidocaine 4% cream applied to wound bed prior to debridement Skin Barriers/Peri-Wound Care: Wound #1 Right,Medial GluteusQUINTERIA, CHISUM. ([id number]) Skin Prep Primary Wound Dressing: Wound #1 Right,Medial Gluteus: Prisma Ag Secondary Dressing: Wound #1 Right,Medial Gluteus: Boardered Foam Dressing Hydrocolloid - windowpain wound with hyrocolloid to help boardered foam stick better; eaken seal Dressing Change Frequency: Wound #1 Right,Medial Gluteus: Change  Dressing Monday, Wednesday, Friday - HHRN please add PRN visit this weekend for dressing change Follow-up Appointments: Wound #1 Right,Medial Gluteus: Return Appointment in 1 week. Hyperbaric Oxygen Therapy: Wound #1 Right,Medial Gluteus: Evaluate for HBO Therapy Indication: - radiation 2.0 ATA for 90 Minutes without Air Breaks One treatment per day (delivered Monday through Friday unless otherwise specified in Special Instructions below): Total # of Treatments: - 40 HBO Contraindications: Wound #1 Right,Medial Gluteus: HBO contraindications of hyperbaric oxygen therapy were reviewed and the patient found to have no untreated pneumothorax or history of spontaneous pneumothorax. HBO contraindications of hyperbaric oxygen therapy were reviewed and the patient found to have no history of medications such as Bleomycin, Adriamycin, disulfiram, cisplatin and sulfamylon and is not currently receiving any  chemotherapy. HBO contraindications of hyperbaric oxygen therapy were reviewed and the patient found to have no Upper respiratory infection and chronic sinusitis. HBO contraindications of hyperbaric oxygen therapy were reviewed and the patient found to have no history of retinal surgery proceeding 6 weeks or intraocular gas HBO contraindications of hyperbaric oxygen therapy were reviewed and the patient found to have no history seizure disorder or any anticonvulsant medication. HBO contraindications of hyperbaric oxygen therapy were reviewed and the patient found to have no septicemia with CO2 retention. HBO contraindications of hyperbaric oxygen therapy were reviewed and the patient found to have no fever greater than 100 degrees. HBO contraindications of hyperbaric oxygen therapy were reviewed and the patient found to have no pregnancy noted. HBO contraindications of hyperbaric oxygen therapy were reviewed and the patient found to have no medications such as steroids or narcotics or Phenergan. TENILLE, MORRILL ([id number]) The patient is scheduled to undergo hyperbaric oxygen therapy with the usual dosage and frequency and will receive 40 sittings as per protocol. Electronic Signature(s) Signed: 09/12/2014 4:30:00 PM By: Christin Fudge MD, FACS Entered By: Christin Fudge on 09/12/2014 16:30:00 [name] ([id number]) -------------------------------------------------------------------------------- SuperBill Details Patient Name: [name]. Date of Service: 09/12/2014 Medical Record Number: [id number] Patient Account Number: [id number] Date of Birth/Sex: 21-Apr-1955 (59 y.o. Female) Treating RN: Cornell Barman Primary Care Physician: Clayborn Bigness Other Clinician: Referring Physician: Clayborn Bigness Treating Physician/Extender: Frann Rider in Treatment: 3 Diagnosis Coding ICD-10 Codes Code Description K62.7 Radiation proctitis C44.520 Squamous cell carcinoma of anal  skin L98.412 Non-pressure chronic ulcer of buttock with fat layer exposed F17.218 Nicotine dependence, cigarettes, with other nicotine-induced disorders L59.9 Disorder of the skin and subcutaneous tissue related to radiation, unspecified Facility Procedures CPT4 Code: [id number] Description: [phone] - WOUND CARE VISIT-LEV 2 EST PT Modifier: Quantity: 1 Physician Procedures CPT4: Description Modifier Quantity Code [id number] - WC PHYS LEVEL 3 - EST PT 1 ICD-10 Description Diagnosis K62.7 Radiation proctitis C44.520 Squamous cell carcinoma of anal skin L98.412 Non-pressure chronic ulcer of buttock with fat layer exposed  L59.9 Disorder of the skin and subcutaneous tissue related to radiation, unspecified Electronic Signature(s) Signed: 09/12/2014 4:30:21 PM By: Christin Fudge MD, FACS Entered By: Christin Fudge on 09/12/2014 16:30:21

## 2014-09-13 NOTE — Progress Notes (Signed)
TAMRA, KOOS ([id number]) Visit Report for 09/12/2014 HBO Risk Assessment Details Patient Name: [name], [name]. Date of Service: 09/12/2014 4:00 PM Medical Record Number: [id number] Patient Account Number: [id number] Date of Birth/Sex: [birth year] (59 y.o. Female) Treating RN: Cornell Barman Primary Care Physician: Clayborn Bigness Other Clinician: Referring Physician: Clayborn Bigness Treating Physician/Extender: Frann Rider in Treatment: 3 HBO Risk Assessment Items Answer Barotrauma Risks: Upper Respiratory Infections No Prior Radiation Treatment to Head/Neck No Tracheostomy No Ear problems or surgery (otosclerosis)- Consider pressure equalization tubes No Sinus Problems, Sinus Obstruction No Pulmonary Risks: Currently seeing a pulmonologisto No Emphysema No Pneumothorax No Tuberculosis No Other lung problems (COPD with CO2 retention, lesions, surgery) -Refer to Yes CPGs Congestive heart Failure -Consider holding HBO if ejection fraction<30% No Asthma or Bronchitis Controlled History of smoking Yes Bullous Disease, Blebs No Other pulmonary abnormalities No Cardiac Risks: Currently seeing a cardiologisto No Pacemaker/AICD No Hypertension No Diuretic Used (water pill). If yes, last time taken: No History of prior or current malignancy (Cancer) Surgery No Radiation therapy Yes 2 weeks ago, on If Yes for Radiation Therapy, number of treatments received: hold ANIYHA, TATE ([id number]) Chemotherapy Yes Ophthalmic Risks: Optic Neuritis No Cataracts Yes Myopia No Retinopathy or Retinal Detachment Surgery- Consider pressure equalization No tubes Confinement Anxiety Claustrophobia No Dialysis Dialysis No Any implants; medical or non-medical Yes if Yes for implants list; Type, Brand and Model port, colostomy bag Pregnancy No Diabetes HgbA1C within 3 months No Seizures Seizures No Currently using these medications: Aspirin No Digoxin (CHF patient)  No Narcotics Yes Nitroprusside No Phenothiazine (Thorazine,etc.) No Prednisone or other steroids No Disulfiram (Antabuse) No Mafenide Acetate (Sulfamylon-burn cream) No Amiodarone No Date of last Chest X-ray: 08/04/2014 Notes Percocet 10/325 Electronic Signature(s) Signed: 09/12/2014 5:22:53 PM By: Gretta Cool, RN, BSN, Kim RN, BSN Entered By: Gretta Cool, RN, BSN, Kim on 09/12/2014 16:19:29

## 2014-09-14 ENCOUNTER — Ambulatory Visit: Payer: Commercial Managed Care - HMO

## 2014-09-14 ENCOUNTER — Inpatient Hospital Stay: Payer: Commercial Managed Care - HMO

## 2014-09-14 ENCOUNTER — Encounter: Payer: Commercial Managed Care - HMO | Admitting: Surgery

## 2014-09-14 DIAGNOSIS — L98412 Non-pressure chronic ulcer of buttock with fat layer exposed: Secondary | ICD-10-CM | POA: Diagnosis not present

## 2014-09-14 NOTE — Progress Notes (Signed)
[name], [name] ([id number]) Visit Report for 09/13/2014 Arrival Information Details [name], [name] Date of Service: 09/13/2014 1:00 PM Patient Name: P. Patient Account Number: [id number] Medical Record Treating RN: [id number] Number: Other Clinician: Jacqulyn Bath Date of Birth/Sex: 21-Jan-1955 (59 y.o. Female) Treating BURNS III, Primary Care Physician: Clayborn Bigness Physician/Extender: Thayer Jew Referring Physician: Thomasenia Bottoms in Treatment: 3 Visit Information History Since Last Visit Added or deleted any medications: No Patient Arrived: Ambulatory Any new allergies or adverse reactions: No Arrival Time: 12:35 Had a fall or experienced change in No Accompanied By: cancer center activities of daily living that may affect transportation risk of falls: Transfer Assistance: None Signs or symptoms of abuse/neglect since last No Patient Identification Verified: Yes visito Secondary Verification Yes Hospitalized since last visit: No Process Completed: Has Dressing in Place as Prescribed: Yes Patient Requires No Pain Present Now: No Transmission-Based Precautions: Patient Has Alerts: No Electronic Signature(s) Signed: 09/13/2014 4:06:40 PM By: Lorine Bears RCP, RRT, CHT Entered By: Becky Sax, Amado Nash on 09/13/2014 13:56:20 [name] ([id number]) -------------------------------------------------------------------------------- Encounter Discharge Information Details [name] Date of Service: 09/13/2014 1:00 PM Patient Name: P. Patient Account Number: [id number] Medical Record Treating RN: [id number] Number: Other Clinician: Jacqulyn Bath Date of Birth/Sex: 09-03-1955 (59 y.o. Female) Treating BURNS III, Primary Care Physician: Clayborn Bigness Physician/Extender: Thayer Jew Referring Physician: Thomasenia Bottoms in Treatment: 3 Encounter Discharge Information Items Discharge Pain Level: 0 Discharge Condition: Stable Ambulatory  Status: Ambulatory Discharge Destination: Home Transportation: Other [place] By: Transportation Schedule Follow-up Appointment: No Medication Reconciliation completed and provided to No Patient/Care Tayva Easterday: Clinical Summary of Care: Notes Patient has an HBO treatment scheduled on 09/12/14 at 13:00 pm. Electronic Signature(s) Signed: 09/13/2014 4:08:56 PM By: Lorine Bears RCP, RRT, CHT Entered By: Lorine Bears on 09/13/2014 16:08:37 [name] ([id number]) -------------------------------------------------------------------------------- Vitals Details [name] Date of Service: 09/13/2014 1:00 PM Patient Name: P. Patient Account Number: [id number] Medical Record Treating RN: [id number] Number: Other Clinician: Jacqulyn Bath Date of Birth/Sex: 1955/05/24 (59 y.o. Female) Treating BURNS III, Primary Care Physician: Clayborn Bigness Physician/Extender: Thayer Jew Referring Physician: Clayborn Bigness Weeks in Treatment: 3 Vital Signs Time Taken: 12:35 Temperature (F): 98.5 Height (in): 68 Pulse (bpm): 84 Weight (lbs): 183 Respiratory Rate (breaths/min): 18 Body Mass Index (BMI): 27.8 Blood Pressure (mmHg): 112/78 Reference Range: 80 - 120 mg / dl Electronic Signature(s) Signed: 09/13/2014 4:06:40 PM By: Lorine Bears RCP, RRT, CHT Entered By: Lorine Bears on 09/13/2014 13:57:02

## 2014-09-14 NOTE — Progress Notes (Addendum)
[name], [name] ([id number]) Visit Report for 09/13/2014 HBO Details Tracy Picket Date of Service: 09/13/2014 1:00 PM Patient Name: P. Patient Account Number: [id number] Medical Record Treating RN: [id number] Number: Other Clinician: Jacqulyn Bath Date of Birth/Sex: 12-05-1955 (59 y.o. Female) Treating BURNS III, Primary Care Physician: Clayborn Bigness Physician/Extender: Thayer Jew Referring Physician: Thomasenia Bottoms in Treatment: 3 HBO Treatment Course Details Treatment Course Ordering Physician: Christin Fudge 1 Number: HBO Treatment Start Date: 09/13/2014 Total Treatments 40 Ordered: HBO Indication: Late Effect of Radiation HBO Treatment Details Treatment Number: 1 Patient Type: Outpatient Chamber Type: Monoplace Chamber #: [id number] Treatment Protocol: 2.0 ATA with 90 minutes oxygen, and no air breaks Treatment Details Compression Rate Down: 1.5 psi / minute De-Compression Rate Up: 1.5 psi / minute Air breaks and breathing Compress Tx Pressure Decompress Decompress periods Begins Reached Begins Ends (leave unused spaces blank) Chamber Pressure 1 ATA 2.0 ATA - - - - - - 2.0 ATA 1 ATA Clock Time (24 hr) 13:21 13:33 - - - - - - 15:03 15:15 Treatment Length: 114 (minutes) Treatment Segments: 4 Capillary Blood Glucose Pre Capillary Blood Glucose (mg/dl): Post Capillary Blood Glucose (mg/dl): Vital Signs Capillary Blood Glucose Reference Range: 80 - 120 mg / dl HBO Diabetic Blood Glucose Intervention Range: <131 mg/dl or >249 mg/dl Time Vitals Blood Respiratory Capillary Blood Glucose Pulse Action Type: Pulse: Temperature: Taken: Pressure: Rate: Glucose (mg/dl): Meter #: Oximetry (%) Taken: Pre 12:35 112/78 84 18 98.5 Post 15:20 122/88 84 18 97.5 Treatment Response [name], [name] ([id number]) Treatment Completion Status: Treatment Completed without Adverse Event HBO Attestation I certify that I supervised this HBO treatment in accordance with Medicare  guidelines. A trained Yes emergency response team is readily available per hospital policies and procedures. Continue HBOT as ordered. Yes Electronic Signature(s) Signed: 09/27/2014 4:26:09 PM By: Loletha Grayer MD Previous Signature: 09/13/2014 4:08:56 PM Version By: Lorine Bears RCP, RRT, CHT Previous Signature: 09/14/2014 8:00:02 AM Version By: Loletha Grayer MD Previous Signature: 09/13/2014 4:02:17 PM Version By: Loletha Grayer MD Previous Signature: 09/13/2014 4:06:40 PM Version By: Lorine Bears RCP, RRT, CHT Entered By: Loletha Grayer on 09/27/2014 16:04:21 [name], [name] ([id number]) -------------------------------------------------------------------------------- HBO Safety Checklist Details Tracy Picket Date of Service: 09/13/2014 1:00 PM Patient Name: P. Patient Account Number: [id number] Medical Record Treating RN: [id number] Number: Other Clinician: Jacqulyn Bath Date of Birth/Sex: [birth year] (59 y.o. Female) Treating BURNS III, Primary Care Physician: Clayborn Bigness Physician/Extender: Thayer Jew Referring Physician: Clayborn Bigness Weeks in Treatment: 3 HBO Safety Checklist Items Safety Checklist Consent Form Signed Patient voided / foley secured and emptied When did you last eato 09/12/14 Last dose of injectable or oral agent n/a Ostomy pouch emptied and vented if applicable NA All implantable devices assessed, documented and approved NA Intravenous access site secured and place Valuables secured Linens and cotton and cotton/polyester blend (less than 51% polyester) Personal oil-based products / skin lotions / body lotions removed Wigs or hairpieces removed Smoking or tobacco materials removed Books / newspapers / magazines / loose paper removed Cologne, aftershave, perfume and deodorant removed Jewelry removed (may wrap wedding band) Make-up removed Hair care products removed Battery operated devices (external)  removed Heating patches and chemical warmers removed NA Titanium eyewear removed NA Nail polish cured greater than 10 hours NA Casting material cured greater than 10 hours NA Hearing aids removed Loose dentures or partials removed NA Prosthetics have been removed Patient demonstrates correct use of air break device (if applicable) Patient concerns have  been addressed Patient grounding bracelet on and cord attached to chamber Specifics for Inpatients (complete in addition to above) Medication sheet sent with patient TYIA, BINFORD. ([id number]) Intravenous medications needed or due during therapy sent with patient Drainage tubes (e.g. nasogastric tube or chest tube secured and vented) Endotracheal or Tracheotomy tube secured Cuff deflated of air and inflated with saline Airway suctioned Electronic Signature(s) Signed: 09/13/2014 4:06:40 PM By: Lorine Bears RCP, RRT, CHT Entered By: Lorine Bears on 09/13/2014 13:57:55

## 2014-09-15 ENCOUNTER — Other Ambulatory Visit: Payer: Self-pay | Admitting: *Deleted

## 2014-09-15 ENCOUNTER — Inpatient Hospital Stay: Payer: Commercial Managed Care - HMO

## 2014-09-15 ENCOUNTER — Encounter: Payer: Commercial Managed Care - HMO | Admitting: Surgery

## 2014-09-15 ENCOUNTER — Ambulatory Visit: Payer: Commercial Managed Care - HMO

## 2014-09-15 ENCOUNTER — Telehealth: Payer: Self-pay | Admitting: *Deleted

## 2014-09-15 DIAGNOSIS — C20 Malignant neoplasm of rectum: Secondary | ICD-10-CM

## 2014-09-15 DIAGNOSIS — L98412 Non-pressure chronic ulcer of buttock with fat layer exposed: Secondary | ICD-10-CM | POA: Diagnosis not present

## 2014-09-15 NOTE — Patient Outreach (Signed)
Second attempt to contact pt after no show home visit on 8/17.  HIPPA compliant voice message left with contact number.  If no response, will try again next week.    [name]   [facility]  [phone]

## 2014-09-15 NOTE — Progress Notes (Signed)
[name], [name] ([id number]) Visit Report for 09/14/2014 Arrival Information Details Patient Name: [name], [name]. Date of Service: 09/14/2014 1:00 PM Medical Record Number: [id number] Patient Account Number: [id number] Date of Birth/Sex: [birth year] (59 y.o. Female) Treating RN: Primary Care Physician: Clayborn Bigness Other Clinician: Referring Physician: Clayborn Bigness Treating Physician/Extender: Frann Rider in Treatment: 4 Visit Information History Since Last Visit Added or deleted any medications: No Patient Arrived: Ambulatory Any new allergies or adverse reactions: No Arrival Time: 12:30 Had a fall or experienced change in No Accompanied By: self activities of daily living that may affect Transfer Assistance: None risk of falls: Patient Identification Verified: Yes Signs or symptoms of abuse/neglect since last No Secondary Verification Process Yes visito Completed: Hospitalized since last visit: No Patient Requires Transmission-Based No Has Dressing in Place as Prescribed: Yes Precautions: Pain Present Now: No Patient Has Alerts: No Electronic Signature(s) Signed: 09/14/2014 3:11:35 PM By: Lorine Bears RCP, RRT, CHT Entered By: Lorine Bears on 09/14/2014 14:23:44 [name] ([id number]) -------------------------------------------------------------------------------- Encounter Discharge Information Details Patient Name: [name]. Date of Service: 09/14/2014 1:00 PM Medical Record Number: [id number] Patient Account Number: [id number] Date of Birth/Sex: 1955-02-02 (59 y.o. Female) Treating RN: Primary Care Physician: Clayborn Bigness Other Clinician: Referring Physician: Clayborn Bigness Treating Physician/Extender: Frann Rider in Treatment: 4 Encounter Discharge Information Items Discharge Pain Level: 0 Discharge Condition: Stable Ambulatory Status: Ambulatory Discharge Destination: Home Transportation:  Other Accompanied By: self Schedule Follow-up Appointment: No Medication Reconciliation completed and No provided to Patient/Care [name]: Clinical Summary of Care: Notes Patient will go home by way of [place]. She has an HBO treatment scheduled on 09/15/14 at 13:00 pm. Electronic Signature(s) Signed: 09/14/2014 3:11:35 PM By: Lorine Bears RCP, RRT, CHT Entered By: [name] on 09/14/2014 14:57:26 [name] ([id number]) -------------------------------------------------------------------------------- Vitals Details Patient Name: [name]. Date of Service: 09/14/2014 1:00 PM Medical Record Number: [id number] Patient Account Number: [id number] Date of Birth/Sex: [birth year] (59 y.o. Female) Treating RN: Primary Care Physician: Clayborn Bigness Other Clinician: Referring Physician: Clayborn Bigness Treating Physician/Extender: Frann Rider in Treatment: 4 Vital Signs Time Taken: 12:30 Temperature (F): 98.0 Height (in): 68 Pulse (bpm): 96 Weight (lbs): 183 Respiratory Rate (breaths/min): 18 Body Mass Index (BMI): 27.8 Blood Pressure (mmHg): 122/86 Reference Range: 80 - 120 mg / dl Electronic Signature(s) Signed: 09/14/2014 3:11:35 PM By: Lorine Bears RCP, RRT, CHT Entered By: [name] on 09/14/2014 14:24:03

## 2014-09-15 NOTE — Progress Notes (Signed)
DELINA, [name] ([id number]) Visit Report for 09/14/2014 HBO Details Patient Name: [name], [name]. Date of Service: 09/14/2014 1:00 PM Medical Record Number: [id number] Patient Account Number: [id number] Date of Birth/Sex: 28-Nov-1955 (59 y.o. Female) Treating RN: Primary Care Physician: Clayborn Bigness Other Clinician: Referring Physician: Clayborn Bigness Treating Physician/Extender: Frann Rider in Treatment: 4 HBO Treatment Course Details Treatment Course Ordering Physician: Christin Fudge 1 Number: HBO Treatment Start Date: 09/13/2014 Total Treatments 40 Ordered: HBO Indication: Late Effect of Radiation HBO Treatment Details Treatment Number: 2 Patient Type: Outpatient Chamber Type: Monoplace Chamber #: [id number] Treatment Protocol: 2.0 ATA with 90 minutes oxygen, and no air breaks Treatment Details Compression Rate Down: 1.5 psi / minute De-Compression Rate Up: 1.5 psi / minute Air breaks and breathing Compress Tx Pressure Decompress Decompress periods Begins Reached Begins Ends (leave unused spaces blank) Chamber Pressure 1 ATA 2.0 ATA - - - - - - 2.0 ATA 1 ATA Clock Time (24 hr) 12:40 12:52 - - - - - - 14:22 14:34 Treatment Length: 114 (minutes) Treatment Segments: 4 Capillary Blood Glucose Pre Capillary Blood Glucose (mg/dl): Post Capillary Blood Glucose (mg/dl): Vital Signs Capillary Blood Glucose Reference Range: 80 - 120 mg / dl HBO Diabetic Blood Glucose Intervention Range: <131 mg/dl or >249 mg/dl Time Vitals Blood Respiratory Capillary Blood Glucose Pulse Action Type: Pulse: Temperature: Taken: Pressure: Rate: Glucose (mg/dl): Meter #: Oximetry (%) Taken: Pre 12:30 122/86 96 18 98 Post 14:35 116/64 78 18 98.5 Treatment Response Treatment Completion Status: Treatment Completed without Adverse Event BRANDAN, GLAUBER. ([id number]) HBO Attestation I certify that I supervised this HBO treatment in accordance with Medicare guidelines. A  trained Yes emergency response team is readily available per hospital policies and procedures. Continue HBOT as ordered. Yes Electronic Signature(s) Signed: 09/14/2014 3:02:31 PM By: Christin Fudge MD, FACS Entered By: Christin Fudge on 09/14/2014 15:02:31 GRAYCIE, HALLEY ([id number]) -------------------------------------------------------------------------------- HBO Safety Checklist Details Patient Name: Tracy Picket P. Date of Service: 09/14/2014 1:00 PM Medical Record Number: [id number] Patient Account Number: [id number] Date of Birth/Sex: [birth year] (59 y.o. Female) Treating RN: Primary Care Physician: Clayborn Bigness Other Clinician: Referring Physician: Clayborn Bigness Treating Physician/Extender: Frann Rider in Treatment: 4 HBO Safety Checklist Items Safety Checklist Consent Form Signed Patient voided / foley secured and emptied When did you last eato 17:00 pm on 9/87/16 Last dose of injectable or oral agent n/a Ostomy pouch emptied and vented if applicable NA All implantable devices assessed, documented and approved NA Intravenous access site secured and place Valuables secured Linens and cotton and cotton/polyester blend (less than 51% polyester) Personal oil-based products / skin lotions / body lotions removed Wigs or hairpieces removed Smoking or tobacco materials removed Books / newspapers / magazines / loose paper removed Cologne, aftershave, perfume and deodorant removed Jewelry removed (may wrap wedding band) Make-up removed Hair care products removed Battery operated devices (external) removed Heating patches and chemical warmers removed NA Titanium eyewear removed NA Nail polish cured greater than 10 hours NA Casting material cured greater than 10 hours NA Hearing aids removed Loose dentures or partials removed NA Prosthetics have been removed Patient demonstrates correct use of air break device (if applicable) Patient concerns have been  addressed Patient grounding bracelet on and cord attached to chamber Specifics for Inpatients (complete in addition to above) Medication sheet sent with patient Intravenous medications needed or due during therapy sent with patient SHAQUILLA, KEHRES. ([id number]) Drainage tubes (e.g. nasogastric tube or chest tube secured and vented) Endotracheal or Tracheotomy  tube secured Cuff deflated of air and inflated with saline Airway suctioned Electronic Signature(s) Signed: 09/14/2014 3:11:35 PM By: Lorine Bears RCP, RRT, CHT Entered By: Becky Sax, Amado Nash on 09/14/2014 14:25:04

## 2014-09-15 NOTE — Telephone Encounter (Signed)
CAlling to have percocet refilled, does not want to pick it up until Monday, but didn't want to forget too call it in

## 2014-09-16 NOTE — Progress Notes (Signed)
DARRIN, APODACA ([id number]) Visit Report for 09/15/2014 Arrival Information Details Patient Name: Tracy, [name]. Date of Service: 09/15/2014 1:00 PM Medical Record Number: [id number] Patient Account Number: [id number] Date of Birth/Sex: [birth year] (59 y.o. Female) Treating RN: Primary Care Physician: Clayborn Bigness Other Clinician: Jacqulyn Bath Referring Physician: Clayborn Bigness Treating Physician/Extender: Frann Rider in Treatment: 4 Visit Information History Since Last Visit Added or deleted any medications: No Patient Arrived: Ambulatory Any new allergies or adverse reactions: No Arrival Time: 12:40 Had a fall or experienced change in No Accompanied By: self activities of daily living that may affect Transfer Assistance: None risk of falls: Patient Identification Verified: Yes Signs or symptoms of abuse/neglect since last No Secondary Verification Process Yes visito Completed: Hospitalized since last visit: No Patient Requires Transmission-Based No Has Dressing in Place as Prescribed: Yes Precautions: Pain Present Now: No Patient Has Alerts: No Electronic Signature(s) Signed: 09/15/2014 3:23:14 PM By: Lorine Bears RCP, RRT, CHT Entered By: Lorine Bears on 09/15/2014 12:56:28 [name] ([id number]) -------------------------------------------------------------------------------- Encounter Discharge Information Details Patient Name: [name]. Date of Service: 09/15/2014 1:00 PM Medical Record Number: [id number] Patient Account Number: [id number] Date of Birth/Sex: 1955/08/09 (59 y.o. Female) Treating RN: Primary Care Physician: Clayborn Bigness Other Clinician: Jacqulyn Bath Referring Physician: Clayborn Bigness Treating Physician/Extender: Frann Rider in Treatment: 4 Encounter Discharge Information Items Discharge Pain Level: 0 Discharge Condition: Stable Ambulatory Status: Ambulatory Discharge Destination:  Home Transportation: Other [place] By: [name] Schedule Follow-up Appointment: No Medication Reconciliation completed and provided to No Patient/Care [name]: Clinical Summary of Care: Notes Patient has an HBO treatment scheduled on 09/18/14 at 13:00 pm. Electronic Signature(s) Signed: 09/15/2014 3:23:14 PM By: Lorine Bears RCP, RRT, CHT Entered By: Becky Sax, Amado Nash on 09/15/2014 15:01:36 Broughton, [name] ([id number]) -------------------------------------------------------------------------------- Vitals Details Patient Name: [name]. Date of Service: 09/15/2014 1:00 PM Medical Record Number: [id number] Patient Account Number: [id number] Date of Birth/Sex: 19-Aug-1955 (59 y.o. Female) Treating RN: Primary Care Physician: Clayborn Bigness Other Clinician: Jacqulyn Bath Referring Physician: Clayborn Bigness Treating Physician/Extender: Frann Rider in Treatment: 4 Vital Signs Time Taken: 12:45 Temperature (F): 98.3 Height (in): 68 Pulse (bpm): 72 Weight (lbs): 183 Respiratory Rate (breaths/min): 18 Body Mass Index (BMI): 27.8 Blood Pressure (mmHg): 128/82 Reference Range: 80 - 120 mg / dl Electronic Signature(s) Signed: 09/15/2014 3:23:14 PM By: Lorine Bears RCP, RRT, CHT Entered By: Lorine Bears on 09/15/2014 12:57:10

## 2014-09-16 NOTE — Progress Notes (Signed)
Tracy, [name] ([id number]) Visit Report for 09/15/2014 HBO Details Patient Name: [name], [name]. Date of Service: 09/15/2014 1:00 PM Medical Record Number: [id number] Patient Account Number: [id number] Date of Birth/Sex: Jun 11, 1955 (59 y.o. Female) Treating RN: Primary Care Physician: Clayborn Bigness Other Clinician: Jacqulyn Bath Referring Physician: Clayborn Bigness Treating Physician/Extender: Frann Rider in Treatment: 4 HBO Treatment Course Details Treatment Course Ordering Physician: Christin Fudge 1 Number: HBO Treatment Start Date: 09/13/2014 Total Treatments 40 Ordered: HBO Indication: Late Effect of Radiation HBO Treatment Details Treatment Number: 3 Patient Type: Outpatient Chamber Type: Monoplace Chamber #: [id number] Treatment Protocol: 2.0 ATA with 90 minutes oxygen, and no air breaks Treatment Details Compression Rate Down: 1.5 psi / minute De-Compression Rate Up: 1.5 psi / minute Air breaks and breathing Compress Tx Pressure Decompress Decompress periods Begins Reached Begins Ends (leave unused spaces blank) Chamber Pressure 1 ATA 2.0 ATA - - - - - - 2.0 ATA 1 ATA Clock Time (24 hr) 12:49 13:00 - - - - - - 14:30 14:42 Treatment Length: 113 (minutes) Treatment Segments: 4 Capillary Blood Glucose Pre Capillary Blood Glucose (mg/dl): Post Capillary Blood Glucose (mg/dl): Vital Signs Capillary Blood Glucose Reference Range: 80 - 120 mg / dl HBO Diabetic Blood Glucose Intervention Range: <131 mg/dl or >249 mg/dl Time Vitals Blood Respiratory Capillary Blood Glucose Pulse Action Type: Pulse: Temperature: Taken: Pressure: Rate: Glucose (mg/dl): Meter #: Oximetry (%) Taken: Pre 12:45 128/82 72 18 98.3 Post 14:45 114/66 78 18 98.3 Treatment Response Treatment Completion Status: Treatment Completed without Adverse Event KENT, BRAUNSCHWEIG ([id number]) Electronic Signature(s) Signed: 09/15/2014 3:23:14 PM By: Lorine Bears RCP, RRT,  CHT Signed: 09/15/2014 4:37:26 PM By: Christin Fudge MD, FACS Previous Signature: 09/15/2014 2:46:46 PM Version By: Christin Fudge MD, FACS Entered By: Lorine Bears on 09/15/2014 14:58:03 Klatt, [name] ([id number]) -------------------------------------------------------------------------------- HBO Safety Checklist Details Patient Name: [name], [name] Date of Service: 09/15/2014 1:00 PM Medical Record Number: [id number] Patient Account Number: [id number] Date of Birth/Sex: 1955-07-27 (59 y.o. Female) Treating RN: Primary Care Physician: Clayborn Bigness Other Clinician: Jacqulyn Bath Referring Physician: Clayborn Bigness Treating Physician/Extender: Frann Rider in Treatment: 4 HBO Safety Checklist Items Safety Checklist Consent Form Signed Patient voided / foley secured and emptied When did you last eato 10:00 am Last dose of injectable or oral agent n/a Ostomy pouch emptied and vented if applicable NA All implantable devices assessed, documented and approved NA Intravenous access site secured and place Valuables secured Linens and cotton and cotton/polyester blend (less than 51% polyester) Personal oil-based products / skin lotions / body lotions removed Wigs or hairpieces removed Smoking or tobacco materials removed Books / newspapers / magazines / loose paper removed Cologne, aftershave, perfume and deodorant removed Jewelry removed (may wrap wedding band) Make-up removed Hair care products removed Battery operated devices (external) removed Heating patches and chemical warmers removed NA Titanium eyewear removed NA Nail polish cured greater than 10 hours NA Casting material cured greater than 10 hours NA Hearing aids removed Loose dentures or partials removed NA Prosthetics have been removed Patient demonstrates correct use of air break device (if applicable) Patient concerns have been addressed Patient grounding bracelet on and cord attached to  chamber Specifics for Inpatients (complete in addition to above) Medication sheet sent with patient Intravenous medications needed or due during therapy sent with patient BLONDINA, CODERRE. ([id number]) Drainage tubes (e.g. nasogastric tube or chest tube secured and vented) Endotracheal or Tracheotomy tube secured Cuff deflated of air and inflated with  saline Airway suctioned Electronic Signature(s) Signed: 09/15/2014 3:23:14 PM By: Lorine Bears RCP, RRT, CHT Entered By: Lorine Bears on 09/15/2014 12:57:51

## 2014-09-18 ENCOUNTER — Inpatient Hospital Stay: Payer: Commercial Managed Care - HMO

## 2014-09-18 ENCOUNTER — Encounter: Payer: Commercial Managed Care - HMO | Admitting: Surgery

## 2014-09-18 ENCOUNTER — Ambulatory Visit: Payer: Commercial Managed Care - HMO

## 2014-09-18 DIAGNOSIS — L98412 Non-pressure chronic ulcer of buttock with fat layer exposed: Secondary | ICD-10-CM | POA: Diagnosis not present

## 2014-09-18 MED ORDER — OXYCODONE-ACETAMINOPHEN 10-325 MG PO TABS: 1.0000 | ORAL_TABLET | Freq: Four times a day (QID) | ORAL | Status: DC | PRN

## 2014-09-18 NOTE — Telephone Encounter (Signed)
Informed that prescription is ready to pick up  

## 2014-09-19 ENCOUNTER — Ambulatory Visit: Payer: Commercial Managed Care - HMO

## 2014-09-19 ENCOUNTER — Inpatient Hospital Stay: Payer: Commercial Managed Care - HMO

## 2014-09-19 ENCOUNTER — Encounter: Payer: Commercial Managed Care - HMO | Admitting: Surgery

## 2014-09-19 ENCOUNTER — Telehealth: Payer: Self-pay

## 2014-09-19 DIAGNOSIS — L98412 Non-pressure chronic ulcer of buttock with fat layer exposed: Secondary | ICD-10-CM | POA: Diagnosis not present

## 2014-09-19 NOTE — Telephone Encounter (Signed)
Called pt per md request to ask dosage of potassium pt is taking.  Per granddaughter speaking with pt she reported she is taking 56mq potassium chloride once daily.  I asked if pt knew when her nexrt labs were scheduled for and per grandaughter she does not.  I told grandaughter we will have scheduling call and schedule her for labs.

## 2014-09-19 NOTE — Progress Notes (Signed)
Tracy, [name] ([id number]) Visit Report for 09/19/2014 Chief Complaint Document Details Patient Name: [name], [name]. Date of Service: 09/19/2014 3:00 PM Medical Record Number: [id number] Patient Account Number: [id number] Date of Birth/Sex: 12-08-1955 (59 y.o. Female) Treating RN: Montey Hora Primary Care Physician: Clayborn Bigness Other Clinician: Referring Physician: Clayborn Bigness Treating Physician/Extender: Frann Rider in Treatment: 4 Information Obtained from: Patient Chief Complaint Patient presents to the wound care center for a consult due non healing wound. 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 Electronic Signature(s) Signed: 09/19/2014 3:53:15 PM By: Christin Fudge MD, FACS Entered By: Christin Fudge on 09/19/2014 15:53:15 [name] ([id number]) -------------------------------------------------------------------------------- HPI Details Patient Name: [name]. Date of Service: 09/19/2014 3:00 PM Medical Record Number: [id number] Patient Account Number: [id number] Date of Birth/Sex: 1955-02-22 (59 y.o. Female) Treating RN: Montey Hora Primary Care Physician: Clayborn Bigness Other Clinician: Referring Physician: Clayborn Bigness Treating Physician/Extender: Frann Rider in Treatment: 4 History of Present Illness Location: anal region and right gluteal area Quality: Patient reports experiencing a sharp pain to affected area(s). Severity: Patient states wound are getting worse. Duration: Patient has had the wound for < 2 weeks prior to presenting for treatment Timing: Pain in wound is constant (hurts all the time) Context: The wound occurred when the patient has been receiving radiation therapy for an anal cancer Modifying Factors: Other treatment(s) tried include:he has been using Silvadene ointment over the wound Associated Signs and Symptoms: Patient reports having difficulty sitting for long  periods. 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 She is receiving cycles of 5-FU and mitomycin-C with radiation. She has been sent to [name] for wound care for addition proctitis and a ulcerated area on the left gluteal region and the perianal vicinity. She tells me that her chemotherapy has been completed last week but she has taken a break from radiation therapy. She is also having some bleeding per rectum and some excoriation of skin around her low back and has significant symptoms of radiation proctitis. Past medical history significant for schizophrenia, asthma, GERD, anxiety depression, bipolar disorder, COPD, PTSD, DVT, peripheral neuropathy, anemia, obstructive sleep apnea. Past surgical history includes recent rectal biopsy, laparoscopic diverting colostomy, hemorrhoidectomy, Port-A-Cath placement. 08/24/2014 -- I spoke to Dr. Lorenda Cahill the medical oncologist on 08/18/14 and she did confirm that the patient has completed her course of chemotherapy and will not be receiving any chemotherapy now or in the near future. Dr. Donella Stade the radiation oncologist is away and I will talk to him on Monday and if he has no objections the patient will probably benefit from hyperbaric oxygen therapy. Radiation therapy had been started on 06/29/2014 and after confirming the present schedule for radiation we can then think about treating the patient with hyperbaric oxygen therapy once it has been discussed with Dr. Donella Stade. On 08/22/2014 I spoke to Dr. Donella Stade who confirmed that the patientos tumor was fairly large and after giving her radiation she has had a resulting open wound from the area where the tumor has shrunk. At this stage he has decided to stop the radiation therapy as he has achieved the desired result and would like me to go ahead with treating her for the late effects of  radiation. 09/12/2014 -- for various social reasons the patient has missed her appointments since 08/24/2014. She is doing fine otherwise and seems to have  recently seen a medical oncologist Dr. Susy Manor. Electronic Signature(s) Signed: 09/19/2014 3:53:23 PM By: Christin Fudge MD, FACS KELLER, MIKELS ([id number]) Entered By: Christin Fudge on 09/19/2014 15:53:23 CAMIA, DIPINTO ([id number]) -------------------------------------------------------------------------------- Physical Exam Details Patient Name: Tracy, Zhane P. Date of Service: 09/19/2014 3:00 PM Medical Record Number: [id number] Patient Account Number: [id number] Date of Birth/Sex: 1955-03-26 (59 y.o. Female) Treating RN: Montey Hora Primary Care Physician: Clayborn Bigness Other Clinician: Referring Physician: Clayborn Bigness Treating Physician/Extender: Frann Rider in Treatment: 4 Constitutional . Pulse regular. Respirations normal and unlabored. Afebrile. . Eyes Nonicteric. Reactive to light. Ears, Nose, Mouth, and Throat Lips, teeth, and gums WNL.Marland Kitchen Moist mucosa without lesions . Neck supple and nontender. No palpable supraclavicular or cervical adenopathy. Normal sized without goiter. Respiratory WNL. No retractions.. Cardiovascular Pedal Pulses WNL. No clubbing, cyanosis or edema. Gastrointestinal (GI) Abdomen without masses or tenderness.. No liver or spleen enlargement or tenderness.. Lymphatic No adneopathy. No adenopathy. No adenopathy. Musculoskeletal Adexa without tenderness or enlargement.. Digits and nails w/o clubbing, cyanosis, infection, petechiae, ischemia, or inflammatory conditions.. Integumentary (Hair, Skin) No suspicious lesions. No crepitus or fluctuance. No peri-wound warmth or erythema. No masses.Marland Kitchen Psychiatric Judgement and insight Intact.. No evidence of depression, anxiety, or agitation.. Notes The wound is looking fairly good and there is less radiation proctitis. The late  effects of radiation also looking much better and there is less erythema and skin excoriation. Electronic Signature(s) Signed: 09/19/2014 3:54:39 PM By: Christin Fudge MD, FACS Entered By: Christin Fudge on 09/19/2014 15:54:38 [name] ([id number]) -------------------------------------------------------------------------------- Physician Orders Details Patient Name: [name]. Date of Service: 09/19/2014 3:00 PM Medical Record Number: [id number] Patient Account Number: [id number] Date of Birth/Sex: 1955/12/14 (59 y.o. Female) Treating RN: Montey Hora Primary Care Physician: Clayborn Bigness Other Clinician: Referring Physician: Clayborn Bigness Treating Physician/Extender: Frann Rider in Treatment: 4 Verbal / Phone Orders: Yes Clinician: Montey Hora Read Back and Verified: Yes Diagnosis Coding Wound Cleansing Wound #1 Right,Medial Gluteus o Clean wound with Normal Saline. o Cleanse wound with mild soap and water o May Shower, gently pat wound dry prior to applying new dressing. Anesthetic Wound #1 Right,Medial Gluteus o Topical Lidocaine 4% cream applied to wound bed prior to debridement Skin Barriers/Peri-Wound Care Wound #1 Right,Medial Gluteus o Skin Prep Primary Wound Dressing Wound #1 Right,Medial Gluteus o Prisma Ag Secondary Dressing Wound #1 Right,Medial Gluteus o Dry Gauze Dressing Change Frequency Wound #1 Right,Medial Gluteus o Change dressing every day. - and as needed Follow-up Appointments Wound #1 Right,Medial Gluteus o Return Appointment in 1 week. Hyperbaric Oxygen Therapy Wound #1 Right,Medial Gluteus o Evaluate for HBO Therapy o Indication: - radiation o 2.0 ATA for [address] Minutes without [address] LERLINE, VALDIVIA. ([id number]) o One treatment per day (delivered Monday through Friday unless otherwise specified in Special Instructions below): o Total # of Treatments: - 40 HBO Contraindications Wound  #1 Right,Medial Gluteus - Other Extremity - rectum o HBO contraindications of hyperbaric oxygen therapy were reviewed and the patient found to have no untreated pneumothorax or history of spontaneous pneumothorax. o HBO contraindications of hyperbaric oxygen therapy were reviewed and the patient found to have no history of medications such as Bleomycin, Adriamycin, disulfiram, cisplatin and sulfamylon and is not currently receiving any chemotherapy. o HBO contraindications of hyperbaric oxygen therapy were reviewed and the patient found to have no Upper respiratory infection and chronic sinusitis. o HBO contraindications of hyperbaric oxygen therapy were reviewed and the patient found to have no history of retinal  surgery proceeding 6 weeks or intraocular gas o HBO contraindications of hyperbaric oxygen therapy were reviewed and the patient found to have no history seizure disorder or any anticonvulsant medication. o HBO contraindications of hyperbaric oxygen therapy were reviewed and the patient found to have no septicemia with CO2 retention. o HBO contraindications of hyperbaric oxygen therapy were reviewed and the patient found to have no fever greater than 100 degrees. o HBO contraindications of hyperbaric oxygen therapy were reviewed and the patient found to have no pregnancy noted. o HBO contraindications of hyperbaric oxygen therapy were reviewed and the patient found to have no medications such as steroids or narcotics or Phenergan. Electronic Signature(s) Signed: 09/19/2014 3:48:51 PM By: Montey Hora Signed: 09/19/2014 4:28:23 PM By: Christin Fudge MD, FACS Entered By: Montey Hora on 09/19/2014 15:48:50 Strey, [name] ([id number]) -------------------------------------------------------------------------------- Problem List Details Patient Name: Garmany, Emiley P. Date of Service: 09/19/2014 3:00 PM Medical Record Number: [id number] Patient Account  Number: [id number] Date of Birth/Sex: July 25, 1955 (59 y.o. 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 Date of Service: 09/19/2014 3:00 PM Medical Record Number: [id number] Patient Account Number: [id number] Date of Birth/Sex: [birth year] (59 y.o. Female) Treating RN: Montey Hora Primary Care Physician: Clayborn Bigness Other Clinician: Referring Physician: Clayborn Bigness Treating Physician/Extender: Frann Rider in Treatment: 4 Subjective Chief Complaint Information obtained from Patient Patient presents to the wound care center for a consult due non healing wound. 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 History of Present Illness (HPI) The following HPI elements were documented for the patient's wound: Location: anal region and right gluteal area Quality: Patient reports experiencing a sharp pain to affected area(s). Severity: Patient states wound are getting worse. Duration: Patient has had the wound for < 2 weeks  prior to presenting for treatment Timing: Pain in wound is constant (hurts all the time) Context: The wound occurred when the patient has been receiving radiation therapy for an anal cancer Modifying Factors: Other treatment(s) tried include:he has been using Silvadene ointment over the wound Associated Signs and Symptoms: Patient reports having difficulty sitting for long periods. 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 She is receiving cycles of 5-FU and mitomycin-C with radiation. She has been sent to [name] for wound care for addition proctitis and a ulcerated area on the left gluteal region and the perianal vicinity. She tells me that her chemotherapy has been completed last week but she has taken a break from radiation therapy. She is also having some bleeding per rectum and some excoriation of skin around her low back and has significant symptoms of radiation proctitis. Past medical history significant for schizophrenia, asthma, GERD, anxiety depression, bipolar disorder, COPD, PTSD, DVT, peripheral neuropathy, anemia, obstructive sleep apnea. Past  surgical history includes recent rectal biopsy, laparoscopic diverting colostomy, hemorrhoidectomy, Port-A-Cath placement. 08/24/2014 -- I spoke to Dr. Lorenda Cahill the medical oncologist on 08/18/14 and she did confirm that the patient has completed her course of chemotherapy and will not be receiving any chemotherapy now or in the near future. Dr. Donella Stade the radiation oncologist is away and I will talk to him on Monday and if he has no objections the patient will probably benefit from hyperbaric oxygen therapy. Radiation therapy had been started on 06/29/2014 and after confirming the present schedule for radiation we can then think about treating the patient with hyperbaric oxygen therapy once it has been discussed with Dr. Donella Stade. On 08/22/2014  I spoke to Dr. Donella Stade who confirmed that the patient s tumor was fairly large and after giving her radiation she has had a resulting open wound from the area where the tumor has shrunk. At this stage he has decided to stop the radiation therapy as he has achieved the desired result and would like me to go KIELEY, AKTER. ([id number]) ahead with treating her for the late effects of radiation. 09/12/2014 -- for various social reasons the patient has missed her appointments since 08/24/2014. She is doing fine otherwise and seems to have recently seen a medical oncologist Dr. Susy Manor. Objective Constitutional Pulse regular. Respirations normal and unlabored. Afebrile. Eyes Nonicteric. Reactive to light. Ears, Nose, Mouth, and Throat Lips, teeth, and gums WNL.Marland Kitchen Moist mucosa without lesions . Neck supple and nontender. No palpable supraclavicular or cervical adenopathy. Normal sized without goiter. Respiratory WNL. No retractions.. Cardiovascular Pedal Pulses WNL. No clubbing, cyanosis or edema. Gastrointestinal (GI) Abdomen without masses or tenderness.. No liver or spleen enlargement or tenderness.. Lymphatic No adneopathy. No adenopathy. No adenopathy. Musculoskeletal Adexa without tenderness or enlargement.. Digits and nails w/o clubbing, cyanosis, infection, petechiae, ischemia, or inflammatory conditions.Marland Kitchen Psychiatric Judgement and insight Intact.. No evidence of depression, anxiety, or agitation.. General Notes: The wound is looking fairly good and there is less radiation proctitis. The late effects of radiation also looking much better and there is less erythema and skin excoriation. Integumentary (Hair, Skin) No suspicious lesions. No crepitus or fluctuance. No peri-wound warmth or erythema. No masses.GLORIANNA, GOTT ([id number]) Wound #1 status is Open. Original cause of wound was Radiation Burn. The wound is located on the Right,Medial Gluteus. The wound measures  2.5cm length x 1.8cm width x 0.4cm depth; 3.534cm^2 area and 1.414cm^3 volume. The wound is limited to skin breakdown. There is a medium amount of serosanguineous drainage noted. The wound margin is distinct with the outline attached to the wound base. There is large (67-100%) red granulation within the wound bed. There is no necrotic tissue within the wound bed. The periwound skin appearance exhibited: Moist. The periwound skin appearance did not exhibit: Callus, Crepitus, Excoriation, Fluctuance, Friable, Induration, Localized Edema, Rash, Scarring, Dry/Scaly, Maceration, Atrophie Blanche, Cyanosis, Ecchymosis, Hemosiderin Staining, Mottled, Pallor, Rubor, Erythema. Periwound temperature was noted as No Abnormality. The periwound has tenderness on palpation. Assessment Active Problems ICD-10 K62.7 - Radiation proctitis C44.520 - Squamous cell carcinoma of anal skin L98.412 - Non-pressure chronic ulcer of buttock with fat layer exposed F17.218 - Nicotine dependence, cigarettes, with other nicotine-induced disorders L59.9 - Disorder of the skin and subcutaneous tissue related to radiation, unspecified Clinically and symptomatically the patient is doing much better from her radiation proctitis and from the late effects of radiation and she is benefiting from hyperbaric oxygen therapy. Local care will be continued with Prisma EEG and  an appropriate dressing. She will continue with hyperbaric oxygen therapy and I will see her back next week. Plan Wound Cleansing: Wound #1 Right,Medial Gluteus: Clean wound with Normal Saline. Cleanse wound with mild soap and water May Shower, gently pat wound dry prior to applying new dressing. Anesthetic: Wound #1 Right,Medial Gluteus: Topical Lidocaine 4% cream applied to wound bed prior to debridement Skin Barriers/Peri-Wound Care: Wound #1 Right,Medial Gluteus: Skin Prep JERZEE, JEROME P. ([id number]) Primary Wound Dressing: Wound #1 Right,Medial  Gluteus: Prisma Ag Secondary Dressing: Wound #1 Right,Medial Gluteus: Dry Gauze Dressing Change Frequency: Wound #1 Right,Medial Gluteus: Change dressing every day. - and as needed Follow-up Appointments: Wound #1 Right,Medial Gluteus: Return Appointment in 1 week. Hyperbaric Oxygen Therapy: Wound #1 Right,Medial Gluteus: Evaluate for HBO Therapy Indication: - radiation 2.0 ATA for 90 Minutes without Air Breaks One treatment per day (delivered Monday through Friday unless otherwise specified in Special Instructions below): Total # of Treatments: - 40 HBO Contraindications: Wound #1 Right,Medial Gluteus: HBO contraindications of hyperbaric oxygen therapy were reviewed and the patient found to have no untreated pneumothorax or history of spontaneous pneumothorax. HBO contraindications of hyperbaric oxygen therapy were reviewed and the patient found to have no history of medications such as Bleomycin, Adriamycin, disulfiram, cisplatin and sulfamylon and is not currently receiving any chemotherapy. HBO contraindications of hyperbaric oxygen therapy were reviewed and the patient found to have no Upper respiratory infection and chronic sinusitis. HBO contraindications of hyperbaric oxygen therapy were reviewed and the patient found to have no history of retinal surgery proceeding 6 weeks or intraocular gas HBO contraindications of hyperbaric oxygen therapy were reviewed and the patient found to have no history seizure disorder or any anticonvulsant medication. HBO contraindications of hyperbaric oxygen therapy were reviewed and the patient found to have no septicemia with CO2 retention. HBO contraindications of hyperbaric oxygen therapy were reviewed and the patient found to have no fever greater than 100 degrees. HBO contraindications of hyperbaric oxygen therapy were reviewed and the patient found to have no pregnancy noted. HBO contraindications of hyperbaric oxygen therapy were  reviewed and the patient found to have no medications such as steroids or narcotics or Phenergan. Clinically and symptomatically the patient is doing much better from her radiation proctitis and from the late effects of radiation and she is benefiting from hyperbaric oxygen therapy. Local care will be continued with Mooty, Zhuri P. ([id number]) Prisma EEG and an appropriate dressing. She will continue with hyperbaric oxygen therapy and I will see her back next week. Electronic Signature(s) Signed: 09/19/2014 3:55:33 PM By: Christin Fudge MD, FACS Entered By: Christin Fudge on 09/19/2014 15:55:33 [name] ([id number]) -------------------------------------------------------------------------------- SuperBill Details Patient Name: [name]. Date of Service: 09/19/2014 Medical Record Number: [id number] Patient Account Number: [id number] Date of Birth/Sex: [birth year] (59 y.o. Female) Treating RN: Montey Hora Primary Care Physician: Clayborn Bigness Other Clinician: Referring Physician: Clayborn Bigness Treating Physician/Extender: Frann Rider in Treatment: 4 Diagnosis Coding ICD-10 Codes Code Description K62.7 Radiation proctitis C44.520 Squamous cell carcinoma of anal skin L98.412 Non-pressure chronic ulcer of buttock with fat layer exposed F17.218 Nicotine dependence, cigarettes, with other nicotine-induced disorders L59.9 Disorder of the skin and subcutaneous tissue related to radiation, unspecified Facility Procedures CPT4 Code: 09983382 Description: 50539 - WOUND CARE VISIT-LEV 2 EST PT Modifier: Quantity: 1 Physician Procedures CPT4: Description Modifier Quantity Code [id number] 37902 - WC PHYS LEVEL 3 - EST PT 1 ICD-10 Description Diagnosis K62.7 Radiation proctitis L59.9 Disorder of the skin and subcutaneous tissue related  to radiation, unspecified Electronic Signature(s) Signed: 09/19/2014 3:55:53 PM By: Christin Fudge MD, FACS Entered By: Christin Fudge on  09/19/2014 15:55:53

## 2014-09-19 NOTE — Progress Notes (Signed)
Tracy, [name] ([id number]) Visit Report for 09/19/2014 HBO Details Patient Name: [name], [name]. Date of Service: 09/19/2014 1:00 PM Medical Record Number: [id number] Patient Account Number: [id number] Date of Birth/Sex: 08-18-1955 (59 y.o. Female) Treating RN: Primary Care Physician: Clayborn Bigness Other Clinician: Jacqulyn Bath Referring Physician: Clayborn Bigness Treating Physician/Extender: Frann Rider in Treatment: 4 HBO Treatment Course Details Treatment Course Ordering Physician: Christin Fudge 1 Number: HBO Treatment Start Date: 09/13/2014 Total Treatments 40 Ordered: HBO Indication: Late Effect of Radiation HBO Treatment Details Treatment Number: 5 Patient Type: Outpatient Chamber Type: Monoplace Chamber #: [id number] Treatment Protocol: 2.0 ATA with 90 minutes oxygen, and no air breaks Treatment Details Compression Rate Down: 1.5 psi / minute De-Compression Rate Up: 1.5 psi / minute Air breaks and breathing Compress Tx Pressure Decompress Decompress periods Begins Reached Begins Ends (leave unused spaces blank) Chamber Pressure 1 ATA 2.0 ATA - - - - - - 2.0 ATA 1 ATA Clock Time (24 hr) 12:56 13:08 - - - - - - 14:39 14:50 Treatment Length: 114 (minutes) Treatment Segments: 4 Capillary Blood Glucose Pre Capillary Blood Glucose (mg/dl): Post Capillary Blood Glucose (mg/dl): Vital Signs Capillary Blood Glucose Reference Range: 80 - 120 mg / dl HBO Diabetic Blood Glucose Intervention Range: <131 mg/dl or >249 mg/dl Time Vitals Blood Respiratory Capillary Blood Glucose Pulse Action Type: Pulse: Temperature: Taken: Pressure: Rate: Glucose (mg/dl): Meter #: Oximetry (%) Taken: Pre 12:45 118/88 96 18 98.5 Post 14:52 126/76 72 18 98.3 Treatment Response Treatment Completion Status: Treatment Completed without Adverse Event [name], [name]. ([id number]) HBO Attestation I certify that I supervised this HBO treatment in accordance with Medicare  guidelines. A trained Yes emergency response team is readily available per hospital policies and procedures. Continue HBOT as ordered. Yes Electronic Signature(s) Signed: 09/19/2014 3:52:29 PM By: Christin Fudge MD, FACS Entered By: Christin Fudge on 09/19/2014 15:52:29 [name], [name] ([id number]) -------------------------------------------------------------------------------- HBO Safety Checklist Details Patient Name: Tracy Picket P. Date of Service: 09/19/2014 1:00 PM Medical Record Number: [id number] Patient Account Number: [id number] Date of Birth/Sex: [birth year] (59 y.o. Female) Treating RN: Primary Care Physician: Clayborn Bigness Other Clinician: Jacqulyn Bath Referring Physician: Clayborn Bigness Treating Physician/Extender: Frann Rider in Treatment: 4 HBO Safety Checklist Items Safety Checklist Consent Form Signed Patient voided / foley secured and emptied When did you last eato 10:30 am Last dose of injectable or oral agent n/a Ostomy pouch emptied and vented if applicable NA All implantable devices assessed, documented and approved NA Intravenous access site secured and place Valuables secured Linens and cotton and cotton/polyester blend (less than 51% polyester) Personal oil-based products / skin lotions / body lotions removed Wigs or hairpieces removed Smoking or tobacco materials removed Books / newspapers / magazines / loose paper removed Cologne, aftershave, perfume and deodorant removed Jewelry removed (may wrap wedding band) Make-up removed Hair care products removed Battery operated devices (external) removed Heating patches and chemical warmers removed NA Titanium eyewear removed NA Nail polish cured greater than 10 hours NA Casting material cured greater than 10 hours NA Hearing aids removed Loose dentures or partials removed NA Prosthetics have been removed Patient demonstrates correct use of air break device (if applicable) Patient concerns  have been addressed Patient grounding bracelet on and cord attached to chamber Specifics for Inpatients (complete in addition to above) Medication sheet sent with patient Intravenous medications needed or due during therapy sent with patient [name], [name]. ([id number]) Drainage tubes (e.g. nasogastric tube or chest tube secured and vented) Endotracheal  or Tracheotomy tube secured Cuff deflated of air and inflated with saline Airway suctioned Electronic Signature(s) Signed: 09/19/2014 3:56:45 PM By: Lorine Bears RCP, RRT, CHT Entered By: Becky Sax, Amado Nash on 09/19/2014 13:03:28

## 2014-09-19 NOTE — Progress Notes (Signed)
[name], [name] ([id number]) Visit Report for 09/18/2014 Arrival Information Details Patient Name: [name], [name]. Date of Service: 09/18/2014 1:00 PM Medical Record Number: [id number] Patient Account Number: [id number] Date of Birth/Sex: October 04, 1955 (59 y.o. Female) Treating RN: Primary Care Physician: Clayborn Bigness Other Clinician: Jacqulyn Bath Referring Physician: Clayborn Bigness Treating Physician/Extender: Frann Rider in Treatment: 4 Visit Information History Since Last Visit Added or deleted any medications: No Patient Arrived: Ambulatory Any new allergies or adverse reactions: No Arrival Time: 12:30 Had a fall or experienced change in No Accompanied By: self activities of daily living that may affect Transfer Assistance: None risk of falls: Patient Identification Verified: Yes Signs or symptoms of abuse/neglect since last No Secondary Verification Process Yes visito Completed: Has Dressing in Place as Prescribed: Yes Patient Requires Transmission-Based No Pain Present Now: No Precautions: Patient Has Alerts: No Electronic Signature(s) Signed: 09/19/2014 3:56:45 PM By: Lorine Bears RCP, RRT, CHT Entered By: Becky Sax, Amado Nash on 09/18/2014 13:05:17 Stefanik, [name] ([id number]) -------------------------------------------------------------------------------- Encounter Discharge Information Details Patient Name: [name]. Date of Service: 09/18/2014 1:00 PM Medical Record Number: [id number] Patient Account Number: [id number] Date of Birth/Sex: [birth year] (59 y.o. Female) Treating RN: Primary Care Physician: Clayborn Bigness Other Clinician: Jacqulyn Bath Referring Physician: Clayborn Bigness Treating Physician/Extender: Frann Rider in Treatment: 4 Encounter Discharge Information Items Discharge Pain Level: 0 Discharge Condition: Stable Ambulatory Status: Ambulatory Discharge Destination: Home Transportation:  Other [place] By: [name] Schedule Follow-up Appointment: No Medication Reconciliation completed and provided to No Patient/Care Emil Klassen: Clinical Summary of Care: Notes Patient has an HBO treatment scheduled on 09/19/14 at 13:00 pm. Electronic Signature(s) Signed: 09/19/2014 3:56:45 PM By: Lorine Bears RCP, RRT, CHT Entered By: Becky Sax, Amado Nash on 09/18/2014 14:46:12 Letts, [name] ([id number]) -------------------------------------------------------------------------------- Vitals Details Patient Name: [name]. Date of Service: 09/18/2014 1:00 PM Medical Record Number: [id number] Patient Account Number: [id number] Date of Birth/Sex: June 07, 1955 (59 y.o. Female) Treating RN: Primary Care Physician: Clayborn Bigness Other Clinician: Jacqulyn Bath Referring Physician: Clayborn Bigness Treating Physician/Extender: Frann Rider in Treatment: 4 Vital Signs Time Taken: 12:41 Temperature (F): 98.3 Height (in): 68 Pulse (bpm): 72 Weight (lbs): 183 Respiratory Rate (breaths/min): 18 Body Mass Index (BMI): 27.8 Blood Pressure (mmHg): 128/80 Reference Range: 80 - 120 mg / dl Electronic Signature(s) Signed: 09/19/2014 3:56:45 PM By: Lorine Bears RCP, RRT, CHT Entered By: Becky Sax, Amado Nash on 09/18/2014 12:52:24

## 2014-09-19 NOTE — Progress Notes (Signed)
DECKLYN, HORNIK ([id number]) Visit Report for 09/18/2014 HBO Details Patient Name: [name], [name]. Date of Service: 09/18/2014 1:00 PM Medical Record Number: [id number] Patient Account Number: [id number] Date of Birth/Sex: 11/05/1955 (59 y.o. Female) Treating RN: Primary Care Physician: Clayborn Bigness Other Clinician: Jacqulyn Bath Referring Physician: Clayborn Bigness Treating Physician/Extender: Frann Rider in Treatment: 4 HBO Treatment Course Details Treatment Course Ordering Physician: Christin Fudge 1 Number: HBO Treatment Start Date: 09/13/2014 Total Treatments 40 Ordered: HBO Indication: Late Effect of Radiation HBO Treatment Details Treatment Number: 4 Patient Type: Outpatient Chamber Type: Monoplace Chamber #: [id number] Treatment Protocol: 2.0 ATA with 90 minutes oxygen, and no air breaks Treatment Details Compression Rate Down: 1.5 psi / minute De-Compression Rate Up: 1.5 psi / minute Air breaks and breathing Compress Tx Pressure Decompress Decompress periods Begins Reached Begins Ends (leave unused spaces blank) Chamber Pressure 1 ATA 2.0 ATA - - - - - - 2.0 ATA 1 ATA Clock Time (24 hr) 12:41 12:53 - - - - - - 14:23 14:34 Treatment Length: 113 (minutes) Treatment Segments: 4 Capillary Blood Glucose Pre Capillary Blood Glucose (mg/dl): Post Capillary Blood Glucose (mg/dl): Vital Signs Capillary Blood Glucose Reference Range: 80 - 120 mg / dl HBO Diabetic Blood Glucose Intervention Range: <131 mg/dl or >249 mg/dl Time Vitals Blood Respiratory Capillary Blood Glucose Pulse Action Type: Pulse: Temperature: Taken: Pressure: Rate: Glucose (mg/dl): Meter #: Oximetry (%) Taken: Pre 12:41 128/80 72 18 98.3 Post 14:37 140/86 72 18 98.3 Treatment Response Treatment Completion Status: Treatment Completed without Adverse Event [name], [name]. ([id number]) HBO Attestation I certify that I supervised this HBO treatment in accordance with Medicare  guidelines. A trained Yes emergency response team is readily available per hospital policies and procedures. Continue HBOT as ordered. Yes Electronic Signature(s) Signed: 09/18/2014 3:55:49 PM By: Christin Fudge MD, FACS Entered By: Christin Fudge on 09/18/2014 15:55:49 [name] ([id number]) -------------------------------------------------------------------------------- HBO Safety Checklist Details Patient Name: Tracy Picket P. Date of Service: 09/18/2014 1:00 PM Medical Record Number: [id number] Patient Account Number: [id number] Date of Birth/Sex: 05-27-1955 (59 y.o. Female) Treating RN: Primary Care Physician: Clayborn Bigness Other Clinician: Jacqulyn Bath Referring Physician: Clayborn Bigness Treating Physician/Extender: Frann Rider in Treatment: 4 HBO Safety Checklist Items Safety Checklist Consent Form Signed Patient voided / foley secured and emptied When did you last eato 09:00 am Last dose of injectable or oral agent n/a Ostomy pouch emptied and vented if applicable NA All implantable devices assessed, documented and approved NA Intravenous access site secured and place Valuables secured Linens and cotton and cotton/polyester blend (less than 51% polyester) Personal oil-based products / skin lotions / body lotions removed Wigs or hairpieces removed Smoking or tobacco materials removed Books / newspapers / magazines / loose paper removed Cologne, aftershave, perfume and deodorant removed Jewelry removed (may wrap wedding band) Make-up removed Hair care products removed Battery operated devices (external) removed Heating patches and chemical warmers removed NA Titanium eyewear removed NA Nail polish cured greater than 10 hours NA Casting material cured greater than 10 hours NA Hearing aids removed Loose dentures or partials removed NA Prosthetics have been removed Patient demonstrates correct use of air break device (if applicable) Patient concerns  have been addressed Patient grounding bracelet on and cord attached to chamber Specifics for Inpatients (complete in addition to above) Medication sheet sent with patient Intravenous medications needed or due during therapy sent with patient [name], [name]. ([id number]) Drainage tubes (e.g. nasogastric tube or chest tube secured and vented) Endotracheal  or Tracheotomy tube secured Cuff deflated of air and inflated with saline Airway suctioned Electronic Signature(s) Signed: 09/19/2014 3:56:45 PM By: Lorine Bears RCP, RRT, CHT Entered By: Becky Sax, Amado Nash on 09/18/2014 13:00:25

## 2014-09-19 NOTE — Progress Notes (Signed)
[name], [name] ([id number]) Visit Report for 09/19/2014 Arrival Information Details Patient Name: [name], [name]. Date of Service: 09/19/2014 1:00 PM Medical Record Number: [id number] Patient Account Number: [id number] Date of Birth/Sex: 05/10/1955 (59 y.o. Female) Treating RN: Primary Care Physician: Clayborn Bigness Other Clinician: Jacqulyn Bath Referring Physician: Clayborn Bigness Treating Physician/Extender: Frann Rider in Treatment: 4 Visit Information History Since Last Visit Added or deleted any medications: No Patient Arrived: Ambulatory Any new allergies or adverse reactions: No Arrival Time: 12:40 Had a fall or experienced change in No Accompanied By: [place]an activities of daily living that may affect driver risk of falls: Transfer Assistance: None Signs or symptoms of abuse/neglect since last No Patient Identification Verified: Yes visito Secondary Verification Process Yes Has Dressing in Place as Prescribed: Yes Completed: Pain Present Now: No Patient Requires Transmission- No Based Precautions: Patient Has Alerts: No Electronic Signature(s) Signed: 09/19/2014 3:56:45 PM By: Lorine Bears RCP, RRT, CHT Entered By: Becky Sax, Amado Nash on 09/19/2014 12:59:19 Brass, [name] ([id number]) -------------------------------------------------------------------------------- Encounter Discharge Information Details Patient Name: [name]. Date of Service: 09/19/2014 1:00 PM Medical Record Number: [id number] Patient Account Number: [id number] Date of Birth/Sex: [birth year] (59 y.o. Female) Treating RN: Primary Care Physician: Clayborn Bigness Other Clinician: Jacqulyn Bath Referring Physician: Clayborn Bigness Treating Physician/Extender: Frann Rider in Treatment: 4 Encounter Discharge Information Items Discharge Pain Level: 0 Discharge Condition: Stable Ambulatory Status: Ambulatory Discharge Destination:  Home Transportation: Other [place] By: [name] Schedule Follow-up Appointment: No Medication Reconciliation completed and provided to No Patient/Care Kellin Fifer: Clinical Summary of Care: Notes Patient has an HBO treatment scheduled on9/14/16 at 13:00 pm. Electronic Signature(s) Signed: 09/19/2014 3:56:45 PM By: Lorine Bears RCP, RRT, CHT Entered By: Becky Sax, Amado Nash on 09/19/2014 15:51:15 Chovanec, [name] ([id number]) -------------------------------------------------------------------------------- Vitals Details Patient Name: [name]. Date of Service: 09/19/2014 1:00 PM Medical Record Number: [id number] Patient Account Number: [id number] Date of Birth/Sex: 1955-08-27 (59 y.o. Female) Treating RN: Primary Care Physician: Clayborn Bigness Other Clinician: Jacqulyn Bath Referring Physician: Clayborn Bigness Treating Physician/Extender: Frann Rider in Treatment: 4 Vital Signs Time Taken: 12:45 Temperature (F): 98.5 Height (in): 68 Pulse (bpm): 96 Weight (lbs): 183 Respiratory Rate (breaths/min): 18 Body Mass Index (BMI): 27.8 Blood Pressure (mmHg): 118/88 Reference Range: 80 - 120 mg / dl Electronic Signature(s) Signed: 09/19/2014 3:56:45 PM By: Lorine Bears RCP, RRT, CHT Entered By: Becky Sax, Amado Nash on 09/19/2014 13:01:21

## 2014-09-20 ENCOUNTER — Other Ambulatory Visit: Payer: Self-pay | Admitting: *Deleted

## 2014-09-20 ENCOUNTER — Ambulatory Visit: Payer: Commercial Managed Care - HMO

## 2014-09-20 ENCOUNTER — Inpatient Hospital Stay: Payer: Commercial Managed Care - HMO

## 2014-09-20 ENCOUNTER — Encounter: Payer: Commercial Managed Care - HMO | Admitting: Surgery

## 2014-09-20 DIAGNOSIS — E876 Hypokalemia: Secondary | ICD-10-CM

## 2014-09-20 DIAGNOSIS — C210 Malignant neoplasm of anus, unspecified: Secondary | ICD-10-CM | POA: Diagnosis not present

## 2014-09-20 DIAGNOSIS — R197 Diarrhea, unspecified: Secondary | ICD-10-CM

## 2014-09-20 DIAGNOSIS — L98412 Non-pressure chronic ulcer of buttock with fat layer exposed: Secondary | ICD-10-CM | POA: Diagnosis not present

## 2014-09-20 LAB — CBC WITH DIFFERENTIAL/PLATELET
Basophils Absolute: 0.1 10*3/uL (ref 0–0.1)
Basophils Relative: 1 %
Eosinophils Absolute: 0.2 10*3/uL (ref 0–0.7)
Eosinophils Relative: 3 %
HCT: 38.8 % (ref 35.0–47.0)
Hemoglobin: 13 g/dL (ref 12.0–16.0)
Lymphocytes Relative: 19 %
Lymphs Abs: 1.3 10*3/uL (ref 1.0–3.6)
MCH: 34.5 pg — ABNORMAL HIGH (ref 26.0–34.0)
MCHC: 33.6 g/dL (ref 32.0–36.0)
MCV: 102.7 fL — ABNORMAL HIGH (ref 80.0–100.0)
Monocytes Absolute: 0.7 10*3/uL (ref 0.2–0.9)
Monocytes Relative: 10 %
Neutro Abs: 5 10*3/uL (ref 1.4–6.5)
Neutrophils Relative %: 67 %
Platelets: 199 10*3/uL (ref 150–440)
RBC: 3.78 MIL/uL — ABNORMAL LOW (ref 3.80–5.20)
RDW: 20 % — ABNORMAL HIGH (ref 11.5–14.5)
WBC: 7.3 10*3/uL (ref 3.6–11.0)

## 2014-09-20 LAB — BASIC METABOLIC PANEL
Anion gap: 6 (ref 5–15)
BUN: 6 mg/dL (ref 6–20)
CO2: 29 mmol/L (ref 22–32)
Calcium: 8.8 mg/dL — ABNORMAL LOW (ref 8.9–10.3)
Chloride: 101 mmol/L (ref 101–111)
Creatinine, Ser: 0.75 mg/dL (ref 0.44–1.00)
GFR calc Af Amer: >60 mL/min (ref >60)
GFR calc non Af Amer: >60 mL/min (ref >60)
Glucose, Bld: 99 mg/dL (ref 65–99)
Potassium: 3.6 mmol/L (ref 3.5–5.1)
Sodium: 136 mmol/L (ref 135–145)

## 2014-09-20 NOTE — Progress Notes (Signed)
MALILLANY, KAZLAUSKAS ([id number]) Visit Report for 09/19/2014 Arrival Information Details Patient Name: [name], [name]. Date of Service: 09/19/2014 3:00 PM Medical Record Number: [id number] Patient Account Number: [id number] Date of Birth/Sex: October 23, 1955 (59 y.o. Female) Treating RN: Montey Hora Primary Care Physician: Clayborn Bigness Other Clinician: Referring Physician: Clayborn Bigness Treating Physician/Extender: Frann Rider in Treatment: 4 Visit Information History Since Last Visit Added or deleted any medications: No Patient Arrived: Ambulatory Any new allergies or adverse reactions: No Arrival Time: 15:10 Had a fall or experienced change in No Accompanied By: self activities of daily living that may affect Transfer Assistance: None risk of falls: Patient Identification Verified: Yes Signs or symptoms of abuse/neglect since last No Secondary Verification Process Yes visito Completed: Hospitalized since last visit: No Patient Requires Transmission-Based No Pain Present Now: No Precautions: Patient Has Alerts: No Electronic Signature(s) Signed: 09/19/2014 4:50:36 PM By: Montey Hora Entered By: Montey Hora on 09/19/2014 15:10:37 Camilli, [name] ([id number]) -------------------------------------------------------------------------------- Clinic Level of Care Assessment Details Patient Name: [name]. Date of Service: 09/19/2014 3:00 PM Medical Record Number: [id number] Patient Account Number: [id number] Date of Birth/Sex: [birth year] (59 y.o. Female) Treating RN: Montey Hora Primary Care Physician: Clayborn Bigness Other Clinician: Referring Physician: Clayborn Bigness Treating Physician/Extender: Frann Rider in Treatment: 4 Clinic Level of Care Assessment Items TOOL 4 Quantity Score '[]'$  - Use when only an EandM is performed on FOLLOW-UP visit 0 ASSESSMENTS - Nursing Assessment / Reassessment X - Reassessment of Co-morbidities (includes updates  in patient status) 1 10 X - Reassessment of Adherence to Treatment Plan 1 5 ASSESSMENTS - Wound and Skin Assessment / Reassessment X - Simple Wound Assessment / Reassessment - one wound 1 5 '[]'$  - Complex Wound Assessment / Reassessment - multiple wounds 0 '[]'$  - Dermatologic / Skin Assessment (not related to wound area) 0 ASSESSMENTS - Focused Assessment '[]'$  - Circumferential Edema Measurements - multi extremities 0 '[]'$  - Nutritional Assessment / Counseling / Intervention 0 '[]'$  - Lower Extremity Assessment (monofilament, tuning fork, pulses) 0 '[]'$  - Peripheral Arterial Disease Assessment (using hand held doppler) 0 ASSESSMENTS - Ostomy and/or Continence Assessment and Care '[]'$  - Incontinence Assessment and Management 0 '[]'$  - Ostomy Care Assessment and Management (repouching, etc.) 0 PROCESS - Coordination of Care X - Simple Patient / Family Education for ongoing care 1 15 '[]'$  - Complex (extensive) Patient / Family Education for ongoing care 0 '[]'$  - Staff obtains Programmer, systems, Records, Test Results / Process Orders 0 '[]'$  - Staff telephones HHA, Nursing Homes / Clarify orders / etc 0 '[]'$  - Routine Transfer to another Facility (non-emergent condition) 0 Sollars, Javaria P. ([id number]) '[]'$  - Routine Hospital Admission (non-emergent condition) 0 '[]'$  - New Admissions / Biomedical engineer / Ordering NPWT, Apligraf, etc. 0 '[]'$  - Emergency Hospital Admission (emergent condition) 0 X - Simple Discharge Coordination 1 10 '[]'$  - Complex (extensive) Discharge Coordination 0 PROCESS - Special Needs '[]'$  - Pediatric / Minor Patient Management 0 '[]'$  - Isolation Patient Management 0 '[]'$  - Hearing / Language / Visual special needs 0 '[]'$  - Assessment of Community assistance (transportation, D/C planning, etc.) 0 '[]'$  - Additional assistance / Altered mentation 0 '[]'$  - Support Surface(s) Assessment (bed, cushion, seat, etc.) 0 INTERVENTIONS - Wound Cleansing / Measurement X - Simple Wound Cleansing - one wound 1 5 '[]'$  -  Complex Wound Cleansing - multiple wounds 0 X - Wound Imaging (photographs - any number of wounds) 1 5 '[]'$  - Wound Tracing (instead of photographs) 0 X -  Simple Wound Measurement - one wound 1 5 '[]'$  - Complex Wound Measurement - multiple wounds 0 INTERVENTIONS - Wound Dressings X - Small Wound Dressing one or multiple wounds 1 10 '[]'$  - Medium Wound Dressing one or multiple wounds 0 '[]'$  - Large Wound Dressing one or multiple wounds 0 '[]'$  - Application of Medications - topical 0 '[]'$  - Application of Medications - injection 0 INTERVENTIONS - Miscellaneous '[]'$  - External ear exam 0 Hardigree, Mattie P. ([id number]) '[]'$  - Specimen Collection (cultures, biopsies, blood, body fluids, etc.) 0 '[]'$  - Specimen(s) / Culture(s) sent or taken to Lab for analysis 0 '[]'$  - Patient Transfer (multiple staff / Harrel Lemon Lift / Similar devices) 0 '[]'$  - Simple Staple / Suture removal (25 or less) 0 '[]'$  - Complex Staple / Suture removal (26 or more) 0 '[]'$  - Hypo / Hyperglycemic Management (close monitor of Blood Glucose) 0 '[]'$  - Ankle / Brachial Index (ABI) - do not check if billed separately 0 X - Vital Signs 1 5 Has the patient been seen at the hospital within the last three years: Yes Total Score: 75 Level Of Care: New/Established - Level 2 Electronic Signature(s) Signed: 09/19/2014 4:50:36 PM By: Montey Hora Entered By: Montey Hora on 09/19/2014 15:41:59 Mottram, [name] ([id number]) -------------------------------------------------------------------------------- Encounter Discharge Information Details Patient Name: [name] Date of Service: 09/19/2014 3:00 PM Medical Record Number: [id number] Patient Account Number: [id number] Date of Birth/Sex: 02/14/1955 (59 y.o. Female) Treating RN: Montey Hora Primary Care Physician: Clayborn Bigness Other Clinician: Referring Physician: Clayborn Bigness Treating Physician/Extender: Frann Rider in Treatment: 4 Encounter Discharge Information Items Discharge  Pain Level: 0 Discharge Condition: Stable Ambulatory Status: Ambulatory Discharge Destination: Home Private Transportation: Auto Accompanied By: self Schedule Follow-up Appointment: Yes Medication Reconciliation completed and No provided to Patient/Care Shalamar Plourde: Clinical Summary of Care: Electronic Signature(s) Signed: 09/19/2014 3:49:33 PM By: Montey Hora Entered By: Montey Hora on 09/19/2014 15:49:33 Asleson, Naomia Mamie Nick ([id number]) -------------------------------------------------------------------------------- Multi Wound Chart Details Patient Name: [name] Date of Service: 09/19/2014 3:00 PM Medical Record Number: [id number] Patient Account Number: [id number] Date of Birth/Sex: [birth year] (59 y.o. Female) Treating RN: Montey Hora Primary Care Physician: Clayborn Bigness Other Clinician: Referring Physician: Clayborn Bigness Treating Physician/Extender: Frann Rider in Treatment: 4 Wound Assessments Treatment Notes Electronic Signature(s) Signed: 09/19/2014 4:50:36 PM By: Montey Hora Entered By: Montey Hora on 09/19/2014 15:11:28 [name] ([id number]) -------------------------------------------------------------------------------- [place] Details Patient Name: [name]. Date of Service: 09/19/2014 3:00 PM Medical Record Number: [id number] Patient Account Number: [id number] Date of Birth/Sex: Jul 25, 1955 (59 y.o. Female) Treating RN: Montey Hora Primary Care Physician: Clayborn Bigness Other Clinician: Referring Physician: Clayborn Bigness Treating Physician/Extender: Frann Rider in Treatment: 4 Active Inactive Abuse / Safety / Falls / Self Care Management Nursing Diagnoses: Impaired physical mobility Potential for falls Self care deficit: actual or potential Goals: Patient will remain injury free Date Initiated: 08/17/2014 Goal Status: Active Patient/caregiver will verbalize understanding of skin  care regimen Date Initiated: 08/17/2014 Goal Status: Active Patient/caregiver will verbalize/demonstrate measure taken to improve self care Date Initiated: 08/17/2014 Goal Status: Active Patient/caregiver will verbalize/demonstrate measures taken to improve the patient's personal safety Date Initiated: 08/17/2014 Goal Status: Active Patient/caregiver will verbalize/demonstrate measures taken to prevent injury and/or falls Date Initiated: 08/17/2014 Goal Status: Active Patient/caregiver will verbalize/demonstrate understanding of what to do in case of emergency Date Initiated: 08/17/2014 Goal Status: Active Interventions: Assess fall risk on admission and as needed Assess: immobility, friction, shearing, incontinence upon admission and as  needed Assess impairment of mobility on admission and as needed per policy Assess self care needs on admission and as needed Provide education on basic hygiene Provide education on fall prevention Provide education on personal and home safety MILEYDI, MILSAP ([id number]) Provide education on safe transfers Treatment Activities: Education provided on Basic Hygiene : 08/17/2014 Notes: Orientation to the Wound Care Program Nursing Diagnoses: Knowledge deficit related to the wound healing center program Goals: Patient/caregiver will verbalize understanding of the [place] Program Date Initiated: 08/17/2014 Goal Status: Active Interventions: Provide education on orientation to the wound center Notes: Wound/Skin Impairment Nursing Diagnoses: Impaired tissue integrity Knowledge deficit related to smoking impact on wound healing Knowledge deficit related to ulceration/compromised skin integrity Goals: Patient/caregiver will verbalize understanding of skin care regimen Date Initiated: 08/17/2014 Goal Status: Active Ulcer/skin breakdown will have a volume reduction of 30% by week 4 Date Initiated: 08/17/2014 Goal Status:  Active Ulcer/skin breakdown will have a volume reduction of 50% by week 8 Date Initiated: 08/17/2014 Goal Status: Active Ulcer/skin breakdown will have a volume reduction of 80% by week 12 Date Initiated: 08/17/2014 Goal Status: Active Ulcer/skin breakdown will heal within 14 weeks Date Initiated: 08/17/2014 Goal Status: Active Interventions: BESS, SALTZMAN ([id number]) Assess patient/caregiver ability to obtain necessary supplies Assess patient/caregiver ability to perform ulcer/skin care regimen upon admission and as needed Assess ulceration(s) every visit Provide education on smoking Provide education on ulcer and skin care Notes: Electronic Signature(s) Signed: 09/19/2014 4:50:36 PM By: Montey Hora Entered By: Montey Hora on 09/19/2014 15:11:20 [name] ([id number]) -------------------------------------------------------------------------------- Patient/Caregiver Education Details Patient Name: [name]. Date of Service: 09/19/2014 3:00 PM Medical Record Number: [id number] Patient Account Number: [id number] Date of Birth/Gender: Mar 04, 1955 (59 y.o. Female) Treating RN: Montey Hora Primary Care Physician: Clayborn Bigness Other Clinician: Referring Physician: Clayborn Bigness Treating Physician/Extender: Frann Rider in Treatment: 4 Education Assessment Education Provided To: Patient Education Topics Provided Wound/Skin Impairment: Handouts: Other: wound care as ordered Methods: Demonstration, Explain/Verbal Responses: State content correctly Electronic Signature(s) Signed: 09/19/2014 3:49:52 PM By: Montey Hora Entered By: Montey Hora on 09/19/2014 15:49:52 [place], [place] Mamie Nick ([id number]) -------------------------------------------------------------------------------- Wound Assessment Details Patient Name: Mansel, Ellagrace P. Date of Service: 09/19/2014 3:00 PM Medical Record Number: [id number] Patient Account Number:  [id number] Date of Birth/Sex: 11-22-1955 (58 y.o. 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 - Medial Wound Open Wounding Event: Radiation Burn Status: Date Acquired: 05/24/2014 Comorbid Chronic Obstructive Pulmonary Weeks Of Treatment: 4 History: Disease (COPD), Hypertension, Clustered Wound: No Received Radiation Photos Photo Uploaded By: Montey Hora on 09/19/2014 16:49:11 Wound Measurements Length: (cm) 2.5 Width: (cm) 1.8 Depth: (cm) 0.4 Area: (cm) 3.534 Volume: (cm) 1.414 % Reduction in Area: 39.2% % Reduction in Volume: -21.7% Epithelialization: None Wound Description Full Thickness Without Exposed Classification: Support Structures Wound Margin: Distinct, outline attached Exudate Medium Amount: Exudate Type: Serosanguineous Exudate Color: red, brown Foul Odor After Cleansing: Yes Due to Product Use: No Wound Bed Granulation Amount: Large (67-100%) Exposed Structure Granulation Quality: Red Fascia Exposed: No Lauver, Letzy P. ([id number]) Necrotic Amount: None Present (0%) Fat Layer Exposed: No Tendon Exposed: No Muscle Exposed: No Joint Exposed: No Bone Exposed: No Limited to Skin Breakdown Periwound Skin Texture Texture Color No Abnormalities Noted: No No Abnormalities Noted: No Callus: No Atrophie Blanche: No Crepitus: No Cyanosis: No Excoriation: No  Ecchymosis: No Fluctuance: No Erythema: No Friable: No Hemosiderin Staining: No Induration: No Mottled: No Localized Edema: No Pallor: No Rash: No Rubor: No Scarring: No Temperature / Pain Moisture Temperature: No Abnormality No Abnormalities Noted: No Tenderness on Palpation: Yes Dry / Scaly: No Maceration: No Moist: Yes Wound  Preparation Ulcer Cleansing: Rinsed/Irrigated with Saline Topical Anesthetic Applied: Other: lidocaine 4%, Treatment Notes Wound #1 (Right, Medial Gluteus) 1. Cleansed with: Clean wound with Normal Saline 4. Dressing Applied: Prisma Ag 5. Secondary Dressing Applied Dry Gauze Electronic Signature(s) Signed: 09/19/2014 4:50:36 PM By: Montey Hora Entered By: Montey Hora on 09/19/2014 15:18:53

## 2014-09-20 NOTE — Progress Notes (Signed)
KAMREE, WIENS ([id number]) Visit Report for 09/20/2014 Arrival Information Details RODNEY, WIGGER Date of Service: 09/20/2014 1:00 PM Patient Name: P. Patient Account Number: [id number] Medical Record Treating RN: [id number] Number: Other Clinician: Jacqulyn Bath Date of Birth/Sex: 03/21/1955 (59 y.o. Female) Treating BURNS III, Primary Care Physician: Clayborn Bigness Physician/Extender: Thayer Jew Referring Physician: Thomasenia Bottoms in Treatment: 4 Visit Information History Since Last Visit Added or deleted any medications: No Patient Arrived: Ambulatory Any new allergies or adverse reactions: No Arrival Time: 12:35 Had a fall or experienced change in No Accompanied By: [place] activities of daily living that may affect Driver risk of falls: Transfer Assistance: None Signs or symptoms of abuse/neglect since last No Patient Identification Verified: Yes visito Secondary Verification Process Yes Hospitalized since last visit: No Completed: Has Dressing in Place as Prescribed: Yes Patient Requires Transmission- No Pain Present Now: No Based Precautions: Patient Has Alerts: No Electronic Signature(s) Signed: 09/20/2014 3:52:54 PM By: Lorine Bears RCP, RRT, CHT Entered By: Becky Sax, Amado Nash on 09/20/2014 12:46:15 [name] ([id number]) -------------------------------------------------------------------------------- Encounter Discharge Information Details [name] Date of Service: 09/20/2014 1:00 PM Patient Name: P. Patient Account Number: [id number] Medical Record Treating RN: [id number] Number: Other Clinician: Jacqulyn Bath Date of Birth/Sex: 05/19/1955 (59 y.o. Female) Treating BURNS III, Primary Care Physician: Clayborn Bigness Physician/Extender: Thayer Jew Referring Physician: Thomasenia Bottoms in Treatment: 4 Encounter Discharge Information Items Discharge Pain Level: 0 Discharge Condition: Stable Ambulatory Status:  Ambulatory Discharge Destination: Home Transportation: Other [place] By: [name] Schedule Follow-up Appointment: No Medication Reconciliation completed and provided to No Patient/Care Dajane Valli: Clinical Summary of Care: Notes Patient has an HBO treatment scheduled on 09/21/14 at 13:00 pm. Electronic Signature(s) Signed: 09/20/2014 3:52:54 PM By: Lorine Bears RCP, RRT, CHT Entered By: Becky Sax, Amado Nash on 09/20/2014 15:01:20 [name] ([id number]) -------------------------------------------------------------------------------- Vitals Details [name] Date of Service: 09/20/2014 1:00 PM Patient Name: P. Patient Account Number: [id number] Medical Record Treating RN: [id number] Number: Other Clinician: Jacqulyn Bath Date of Birth/Sex: October 24, 1955 (59 y.o. Female) Treating BURNS III, Primary Care Physician: Clayborn Bigness Physician/Extender: Thayer Jew Referring Physician: Clayborn Bigness Weeks in Treatment: 4 Vital Signs Time Taken: 12:37 Temperature (F): 98.6 Height (in): 68 Pulse (bpm): 90 Weight (lbs): 183 Respiratory Rate (breaths/min): 18 Body Mass Index (BMI): 27.8 Blood Pressure (mmHg): 134/80 Reference Range: 80 - 120 mg / dl Electronic Signature(s) Signed: 09/20/2014 3:52:54 PM By: Lorine Bears RCP, RRT, CHT Entered By: Lorine Bears on 09/20/2014 12:46:38

## 2014-09-21 ENCOUNTER — Encounter: Payer: Self-pay | Admitting: Hematology and Oncology

## 2014-09-21 ENCOUNTER — Inpatient Hospital Stay: Payer: Commercial Managed Care - HMO

## 2014-09-21 ENCOUNTER — Other Ambulatory Visit: Payer: Self-pay | Admitting: Hematology and Oncology

## 2014-09-21 ENCOUNTER — Encounter: Payer: Commercial Managed Care - HMO | Admitting: Surgery

## 2014-09-21 ENCOUNTER — Ambulatory Visit: Payer: Commercial Managed Care - HMO

## 2014-09-21 ENCOUNTER — Other Ambulatory Visit: Payer: Self-pay

## 2014-09-21 ENCOUNTER — Other Ambulatory Visit: Payer: Commercial Managed Care - HMO

## 2014-09-21 ENCOUNTER — Inpatient Hospital Stay (HOSPITAL_BASED_OUTPATIENT_CLINIC_OR_DEPARTMENT_OTHER): Payer: Commercial Managed Care - HMO | Admitting: Hematology and Oncology

## 2014-09-21 VITALS — BP 162/84 | HR 84 | Temp 98.3°F | Resp 18 | Ht 68.0 in | Wt 180.1 lb

## 2014-09-21 DIAGNOSIS — G629 Polyneuropathy, unspecified: Secondary | ICD-10-CM

## 2014-09-21 DIAGNOSIS — E876 Hypokalemia: Secondary | ICD-10-CM

## 2014-09-21 DIAGNOSIS — Z86718 Personal history of other venous thrombosis and embolism: Secondary | ICD-10-CM

## 2014-09-21 DIAGNOSIS — J449 Chronic obstructive pulmonary disease, unspecified: Secondary | ICD-10-CM

## 2014-09-21 DIAGNOSIS — F17210 Nicotine dependence, cigarettes, uncomplicated: Secondary | ICD-10-CM

## 2014-09-21 DIAGNOSIS — Z79899 Other long term (current) drug therapy: Secondary | ICD-10-CM | POA: Diagnosis not present

## 2014-09-21 DIAGNOSIS — Z809 Family history of malignant neoplasm, unspecified: Secondary | ICD-10-CM

## 2014-09-21 DIAGNOSIS — J45909 Unspecified asthma, uncomplicated: Secondary | ICD-10-CM

## 2014-09-21 DIAGNOSIS — F319 Bipolar disorder, unspecified: Secondary | ICD-10-CM

## 2014-09-21 DIAGNOSIS — R251 Tremor, unspecified: Secondary | ICD-10-CM

## 2014-09-21 DIAGNOSIS — E039 Hypothyroidism, unspecified: Secondary | ICD-10-CM

## 2014-09-21 DIAGNOSIS — L98412 Non-pressure chronic ulcer of buttock with fat layer exposed: Secondary | ICD-10-CM | POA: Diagnosis not present

## 2014-09-21 DIAGNOSIS — Z7952 Long term (current) use of systemic steroids: Secondary | ICD-10-CM

## 2014-09-21 DIAGNOSIS — D7589 Other specified diseases of blood and blood-forming organs: Secondary | ICD-10-CM

## 2014-09-21 DIAGNOSIS — G4730 Sleep apnea, unspecified: Secondary | ICD-10-CM

## 2014-09-21 DIAGNOSIS — C210 Malignant neoplasm of anus, unspecified: Secondary | ICD-10-CM

## 2014-09-21 DIAGNOSIS — M5136 Other intervertebral disc degeneration, lumbar region: Secondary | ICD-10-CM

## 2014-09-21 DIAGNOSIS — Z8701 Personal history of pneumonia (recurrent): Secondary | ICD-10-CM

## 2014-09-21 DIAGNOSIS — E041 Nontoxic single thyroid nodule: Secondary | ICD-10-CM

## 2014-09-21 DIAGNOSIS — F209 Schizophrenia, unspecified: Secondary | ICD-10-CM

## 2014-09-21 DIAGNOSIS — M797 Fibromyalgia: Secondary | ICD-10-CM

## 2014-09-21 DIAGNOSIS — D649 Anemia, unspecified: Secondary | ICD-10-CM

## 2014-09-21 DIAGNOSIS — F329 Major depressive disorder, single episode, unspecified: Secondary | ICD-10-CM

## 2014-09-21 DIAGNOSIS — F4310 Post-traumatic stress disorder, unspecified: Secondary | ICD-10-CM

## 2014-09-21 DIAGNOSIS — Z933 Colostomy status: Secondary | ICD-10-CM

## 2014-09-21 DIAGNOSIS — F419 Anxiety disorder, unspecified: Secondary | ICD-10-CM

## 2014-09-21 DIAGNOSIS — K219 Gastro-esophageal reflux disease without esophagitis: Secondary | ICD-10-CM

## 2014-09-21 DIAGNOSIS — Z923 Personal history of irradiation: Secondary | ICD-10-CM

## 2014-09-21 DIAGNOSIS — R0602 Shortness of breath: Secondary | ICD-10-CM

## 2014-09-21 DIAGNOSIS — Z9221 Personal history of antineoplastic chemotherapy: Secondary | ICD-10-CM

## 2014-09-21 NOTE — Progress Notes (Signed)
[name] ([id number]) Visit Report for 09/21/2014 Arrival Information Details Patient Name: [name]. Date of Service: 09/21/2014 1:00 PM Medical Record Number: [id number] Patient Account Number: [id number] Date of Birth/Sex: [birth year] (58 y.o. Female) Treating RN: Primary Care Physician: Clayborn Bigness Other Clinician: Jacqulyn Bath Referring Physician: Clayborn Bigness Treating Physician/Extender: Frann Rider in Treatment: 5 Visit Information History Since Last Visit Added or deleted any medications: No Patient Arrived: Ambulatory Any new allergies or adverse reactions: No Arrival Time: 12:30 Had a fall or experienced change in No Accompanied By: [place] activities of daily living that may affect [name] risk of falls: Transfer Assistance: None Hospitalized since last visit: No Patient Identification Verified: Yes Has Dressing in Place as Prescribed: Yes Secondary Verification Process Yes Pain Present Now: No Completed: Patient Requires Transmission- No Based Precautions: Patient Has Alerts: No Electronic Signature(s) Signed: 09/21/2014 3:24:36 PM By: Lorine Bears RCP, RRT, CHT Entered By: Becky Sax, Amado Nash on 09/21/2014 12:59:06 [name] ([id number]) -------------------------------------------------------------------------------- Encounter Discharge Information Details Patient Name: [name]. Date of Service: 09/21/2014 1:00 PM Medical Record Number: [id number] Patient Account Number: [id number] Date of Birth/Sex: 01-27-1955 (59 y.o. Female) Treating RN: Primary Care Physician: Clayborn Bigness Other Clinician: Jacqulyn Bath Referring Physician: Clayborn Bigness Treating Physician/Extender: Frann Rider in Treatment: 5 Encounter Discharge Information Items Discharge Pain Level: 0 Discharge Condition: Stable Ambulatory Status: Ambulatory Discharge Destination: Home Transportation: [place] By: [name] Schedule Follow-up Appointment: No Medication Reconciliation completed and provided to No Patient/Care Eudell Julian: Clinical Summary of Care: Notes Patient has an HBO treatment scheduled on 09/22/14 at 13:00 pm. Electronic Signature(s) Signed: 09/21/2014 3:24:36 PM By: Lorine Bears RCP, RRT, CHT Entered By: Lorine Bears on 09/21/2014 15:22:03 [name] ([id number]) -------------------------------------------------------------------------------- Vitals Details Patient Name: [name]. Date of Service: 09/21/2014 1:00 PM Medical Record Number: [id number] Patient Account Number: [id number] Date of Birth/Sex: 05-09-1955 (59 y.o. Female) Treating RN: Primary Care Physician: Clayborn Bigness Other Clinician: Jacqulyn Bath Referring Physician: Clayborn Bigness Treating Physician/Extender: Frann Rider in Treatment: 5 Vital Signs Time Taken: 12:30 Temperature (F): 98.3 Height (in): 68 Pulse (bpm): 72 Weight (lbs): 183 Respiratory Rate (breaths/min): 18 Body Mass Index (BMI): 27.8 Blood Pressure (mmHg): 132/84 Reference Range: 80 - 120 mg / dl Electronic Signature(s) Signed: 09/21/2014 3:24:36 PM By: Lorine Bears RCP, RRT, CHT Entered By: Lorine Bears on 09/21/2014 13:01:39

## 2014-09-21 NOTE — Progress Notes (Signed)
[name] ([id number]) Visit Report for 09/21/2014 HBO Details Patient Name: [name], [name]. Date of Service: 09/21/2014 1:00 PM Medical Record Number: [id number] Patient Account Number: [id number] Date of Birth/Sex: 1955-11-03 (59 y.o. Female) Treating RN: Primary Care Physician: Clayborn Bigness Other Clinician: Jacqulyn Bath Referring Physician: Clayborn Bigness Treating Physician/Extender: Frann Rider in Treatment: 5 HBO Treatment Course Details Treatment Course Ordering Physician: Christin Fudge 1 Number: HBO Treatment Start Date: 09/13/2014 Total Treatments 40 Ordered: HBO Indication: Late Effect of Radiation HBO Treatment Details Treatment Number: 7 Patient Type: Outpatient Chamber Type: Monoplace Chamber #: [id number] Treatment Protocol: 2.0 ATA with 90 minutes oxygen, and no air breaks Treatment Details Compression Rate Down: 1.5 psi / minute De-Compression Rate Up: 1.5 psi / minute Air breaks and breathing Compress Tx Pressure Decompress Decompress periods Begins Reached Begins Ends (leave unused spaces blank) Chamber Pressure 1 ATA 2.0 ATA - - - - - - 2.0 ATA 1 ATA Clock Time (24 hr) 12:48 13:00 - - - - - - 14:30 14:41 Treatment Length: 113 (minutes) Treatment Segments: 4 Capillary Blood Glucose Pre Capillary Blood Glucose (mg/dl): Post Capillary Blood Glucose (mg/dl): Vital Signs Capillary Blood Glucose Reference Range: 80 - 120 mg / dl HBO Diabetic Blood Glucose Intervention Range: <131 mg/dl or >249 mg/dl Time Vitals Blood Respiratory Capillary Blood Glucose Pulse Action Type: Pulse: Temperature: Taken: Pressure: Rate: Glucose (mg/dl): Meter #: Oximetry (%) Taken: Pre 12:30 132/84 72 18 98.3 Post 14:43 112/84 72 18 97.9 Treatment Response Treatment Completion Status: Treatment Completed without Adverse Event KAREEN, JEFFERYS. ([id number]) HBO Attestation I certify that I supervised this HBO treatment in accordance with Medicare  guidelines. A trained Yes emergency response team is readily available per hospital policies and procedures. Continue HBOT as ordered. Yes Electronic Signature(s) Signed: 09/21/2014 3:41:12 PM By: Christin Fudge MD, FACS Previous Signature: 09/21/2014 3:24:36 PM Version By: Lorine Bears RCP, RRT, CHT Entered By: Christin Fudge on 09/21/2014 15:41:11 [name] ([id number]) -------------------------------------------------------------------------------- HBO Safety Checklist Details Patient Name: [name]. Date of Service: 09/21/2014 1:00 PM Medical Record Number: [id number] Patient Account Number: [id number] Date of Birth/Sex: 01/04/1956 (59 y.o. Female) Treating RN: Primary Care Physician: Clayborn Bigness Other Clinician: Jacqulyn Bath Referring Physician: Clayborn Bigness Treating Physician/Extender: Frann Rider in Treatment: 5 HBO Safety Checklist Items Safety Checklist Consent Form Signed Patient voided / foley secured and emptied When did you last eato 08:30 am Last dose of injectable or oral agent n/a Ostomy pouch emptied and vented if applicable NA All implantable devices assessed, documented and approved NA Intravenous access site secured and place Valuables secured Linens and cotton and cotton/polyester blend (less than 51% polyester) Personal oil-based products / skin lotions / body lotions removed Wigs or hairpieces removed Smoking or tobacco materials removed Books / newspapers / magazines / loose paper removed Cologne, aftershave, perfume and deodorant removed Jewelry removed (may wrap wedding band) Make-up removed Hair care products removed Battery operated devices (external) removed Heating patches and chemical warmers removed NA Titanium eyewear removed NA Nail polish cured greater than 10 hours NA Casting material cured greater than 10 hours NA Hearing aids removed Loose dentures or partials removed NA Prosthetics have been  removed Patient demonstrates correct use of air break device (if applicable) Patient concerns have been addressed Patient grounding bracelet on and cord attached to chamber Specifics for Inpatients (complete in addition to above) Medication sheet sent with patient Intravenous medications needed or due during therapy sent with patient Depaolo, Shivangi [name]. (  [id number]) Drainage tubes (e.g. nasogastric tube or chest tube secured and vented) Endotracheal or Tracheotomy tube secured Cuff deflated of air and inflated with saline Airway suctioned Electronic Signature(s) Signed: 09/21/2014 3:24:36 PM By: Lorine Bears RCP, RRT, CHT Entered By: Lorine Bears on 09/21/2014 13:02:36

## 2014-09-21 NOTE — Progress Notes (Signed)
[facility] day:  09/21/2014   Chief Complaint: STINA GANE is a 59 y.o. 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  HPI:  The patient was last seen in the medical oncology clinic on 09/07/2014.  At that time, she was day 39 of cycle #2 5FU and mitomycin-C with radiation.  Radiation had been put on hold since 08/23/2014 second to perirectal wound issues. She was going to wound care.  She had not been started hyperbaric oxygen.  She was taking 2 potassium pills a day (recently ran out). Her colostomy bag had liquid stool (new). She was having to empty her bag 3-4 times a day.   During the interim, she started hyperbaric oxygen on 09/13/2014.   She states that she has 8 weeks of treatment (40 sessions; Monday - Friday).  She has already noted improvement.  She can now sit up, ride the bus, and go shopping.  She has gained about 6 pounds. Her overall health has improved. She is "eating healthy" and "feels so much better".   Regarding her diarrhea, she states that this resolved approximately 1 day after her last visit. She took her potassium twice a day for 3 days and has been taking 1 a day since that time. She has normal ostomy output.  Past Medical History  Diagnosis Date  . Schizophrenia   . Asthma   . GERD (gastroesophageal reflux disease)   . Anxiety   . Depression   . Bipolar disorder   . COPD (chronic obstructive pulmonary disease)   . Occasional tremors     right hand  . PTSD (post-traumatic stress disorder)   . Shortness of breath dyspnea   . Fibromyalgia   . DVT (deep venous thrombosis) 2011    RUE  . Thyroid nodule   . DDD (degenerative disc disease), lumbar   . Spinal stenosis   . Peripheral neuropathy   . Rotator cuff tear     right  . Pneumonia 2011  . Hypothyroidism     no meds currently  . Anemia     during  pregnancy only  . Hypertension     Off meds x 15 years-well controlled now per pt  . Squamous cell cancer, anus   . Severe obstructive sleep apnea 06/27/2014    Past Surgical History  Procedure Laterality Date  . Foot surgery Right   . Tubal ligation    . Eye surgery Bilateral   . Mouth surgery  2002  . Rectal biopsy N/A 05/08/2014    Procedure: BIOPSY RECTAL;  Surgeon: Marlyce Huge, MD;  Location: [facility] ORS;  Service: General;  Laterality: N/A;  . Evaluation under anesthesia with hemorrhoidectomy N/A 05/08/2014    Procedure: EXAM UNDER ANESTHESIA WITH HEMORRHOIDECTOMY;  Surgeon: Marlyce Huge, MD;  Location: [facility] ORS;  Service: General;  Laterality: N/A;  . Portacath placement N/A 05/20/2014    Procedure: INSERTION PORT-A-CATH;  Surgeon: Florene Glen, MD;  Location: [facility] ORS;  Service: General;  Laterality: N/A;  . Laparoscopic diverted colostomy N/A 05/26/2014    Procedure: LAPAROSCOPIC DIVERTED COLOSTOMY;  Surgeon: Marlyce Huge, MD;  Location: [facility] ORS;  Service: General;  Laterality: N/A;    Family History  Problem Relation Age of Onset  . Cancer Maternal Aunt   . Cancer Paternal Uncle     Social History:  reports that  she has been smoking Cigarettes.  She has a 11 pack-year smoking history. She has never used smokeless tobacco. She reports that she drinks alcohol. She reports that she does not use illicit drugs.  The patient is alone today.  Allergies:  Allergies  Allergen Reactions  . Sulfa Antibiotics Hives    Current Medications: Current Outpatient Prescriptions  Medication Sig Dispense Refill  . albuterol (PROVENTIL HFA;VENTOLIN HFA) 108 (90 BASE) MCG/ACT inhaler Inhale 1 puff into the lungs every 6 (six) hours as needed for wheezing or shortness of breath.    Marland Kitchen albuterol (PROVENTIL) (2.5 MG/3ML) 0.083% nebulizer solution Inhale 3 mLs (2.5 mg total) into the lungs every 6 (six) hours as needed for wheezing or shortness of breath. 75 mL 12  .  Alum & Mag Hydroxide-Simeth (MAGIC MOUTHWASH) SOLN Take 5 mLs by mouth 4 (four) times daily. 480 mL 3  . budesonide-formoterol (SYMBICORT) 160-4.5 MCG/ACT inhaler Inhale 2 puffs into the lungs 2 (two) times daily. 1 Inhaler 12  . Calcium Carb-Cholecalciferol (CALCIUM 600+D) 600-800 MG-UNIT TABS Take 1 tablet by mouth 2 (two) times daily.     . clonazePAM (KLONOPIN) 1 MG tablet Take 1 tablet (1 mg total) by mouth 3 (three) times daily as needed for anxiety. 42 tablet 0  . docusate sodium (COLACE) 100 MG capsule Take 2 capsules (200 mg total) by mouth 2 (two) times daily. (Patient taking differently: Take 200 mg by mouth 2 (two) times daily as needed for mild constipation. ) 120 capsule 0  . DULoxetine (CYMBALTA) 30 MG capsule     . escitalopram (LEXAPRO) 20 MG tablet Take 20 mg by mouth at bedtime.    Marland Kitchen etodolac (LODINE) 500 MG tablet Take 500 mg by mouth 2 (two) times daily.    Marland Kitchen lidocaine (XYLOCAINE) 2 % jelly Apply 1 application topically as needed (for pain).    . ondansetron (ZOFRAN) 8 MG tablet Take 1 tablet (8 mg total) by mouth 2 (two) times daily. Start the day after chemo for 2 days. Then take as needed for nausea or vomiting. 30 tablet 1  . oxybutynin (DITROPAN) 5 MG tablet Take 5 mg by mouth 2 (two) times daily.    Marland Kitchen oxyCODONE-acetaminophen (PERCOCET) 10-325 MG per tablet Take 1 tablet by mouth every 6 (six) hours as needed for pain. 40 tablet 0  . polyethylene glycol powder (GLYCOLAX/MIRALAX) powder 1 cap full in a full glass of water, once a day for 5 days. 255 g 0  . potassium chloride SA (K-DUR,KLOR-CON) 20 MEQ tablet Take 1 tablet (20 mEq total) by mouth 2 (two) times daily. for 3 days then 1 pill a day 30 tablet 0  . psyllium (METAMUCIL) 58.6 % powder Take 1 packet by mouth 2 (two) times daily as needed (for constipation).    . ranitidine (ZANTAC) 150 MG tablet Take 150 mg by mouth 2 (two) times daily.    Marland Kitchen senna (SENOKOT) 8.6 MG tablet Take 2 tablets by mouth 2 (two) times daily as  needed for constipation.    . silver sulfADIAZINE (SILVADENE) 1 % cream Apply 1 application topically 2 (two) times daily. 50 g 2  . simvastatin (ZOCOR) 40 MG tablet Take 40 mg by mouth at bedtime.     . sucralfate (CARAFATE) 1 G tablet Take 1 tablet (1 g total) by mouth 3 (three) times daily. Dissolve tablet in 2-3 tbsp of warm water; swish and swallow. 90 tablet 3  . tiotropium (SPIRIVA) 18 MCG inhalation capsule Place 18 mcg  into inhaler and inhale daily.    . traMADol (ULTRAM) 50 MG tablet     . zolpidem (AMBIEN) 5 MG tablet Take 5 mg by mouth at bedtime as needed for sleep.    Marland Kitchen KLOR-CON M10 10 MEQ tablet     . potassium chloride SA (K-DUR,KLOR-CON) 20 MEQ tablet Take 1 tablet (20 mEq total) by mouth 2 (two) times daily. Through Thursday then  See md on Friday (Patient not taking: Reported on 09/21/2014) 14 tablet 0  . predniSONE (DELTASONE) 20 MG tablet Take 2 tablets (40 mg total) by mouth daily. (Patient not taking: Reported on 09/21/2014) 10 tablet 0   No current facility-administered medications for this visit.   Facility-Administered Medications Ordered in Other Visits  Medication Dose Route Frequency Provider Last Rate Last Dose  . heparin lock flush 100 unit/mL  500 Units Intravenous Once Lequita Asal, MD      . sodium chloride 0.9 % injection 10 mL  10 mL Intravenous PRN Lequita Asal, MD        Review of Systems:  GENERAL:  Feels great.  No fevers or sweats.  Weight  fluctuating. PERFORMANCE STATUS (ECOG):  2 HEENT:  No visual changes, runny nose, mouth sores or sore throat. Lungs: No shortness of breath or cough.  No hemoptysis. Cardiac:  No chest pain, palpitations, orthopnea, or PND. GI:  Colostomy with normal output.  No nausea, vomiting, constipation, melena or hematochezia. GU:  No urgency, frequency, dysuria, or hematuria. Musculoskeletal:  No back pain.  No joint pain.  No muscle tenderness. Extremities:  No pain or swelling. Skin:  Improvement in  per-rectal breakdown since hyperbaric oxygen. Neuro:  No headache, numbness or weakness, balance or coordination issues. Endocrine:  No diabetes, thyroid issues, hot flashes or night sweats. Psych:  No mood changes or depression.  Anxiety improved since start of treatment. Pain:  Decreased pain associated with anal mass. Review of systems:  All other systems reviewed and found to be negative.   Physical Exam: Blood pressure 162/84, pulse 84, temperature 98.3 F (36.8 C), temperature source Tympanic, resp. rate 18, height 5' 8"  (1.727 m), weight 180 lb 1.8 oz (81.698 kg).  GENERAL:  Chronically ill appearing woman lying on her side in the exam room in no acute distress.  MENTAL STATUS:  Alert and oriented to person, place and time. HEAD:  Pearline Cables hair.  Normocephalic, atraumatic, face symmetric, no Cushingoid features. EYES:  Blue eyes.  Pupils equal round and reactive to light and accomodation.  No conjunctivitis or scleral icterus. ENT: No oral lesions.  Tongue normal. Mucous membranes moist.  RESPIRATORY:  Clear to auscultation without rales, wheezes or rhonchi. CARDIOVASCULAR:  Regular rate and rhythm without murmur, rub or gallop. ABDOMEN:  Soft, non-tender, with active bowel sounds, and no hepatosplenomegaly.  No masses. RECTUM:  Perirectal linear/elliptical breakdown approximately 3.5 x 1 cm, improved (shallow/less deep). SKIN:  Perirectal wound (healing).  No other rashes, ulcers or lesions. EXTREMITIES: No edema, no skin discoloration or tenderness.  No palpable cords. LYMPH NODES: No palpable cervical, supraclavicular, axillary or inguinal adenopathy  NEUROLOGICAL: Unremarkable. PSYCH:  Appropriate.   Appointment on 09/20/2014  Component Date Value Ref Range Status  . WBC 09/20/2014 7.3  3.6 - 11.0 K/uL Final  . RBC 09/20/2014 3.78* 3.80 - 5.20 MIL/uL Final  . Hemoglobin 09/20/2014 13.0  12.0 - 16.0 g/dL Final  . HCT 09/20/2014 38.8  35.0 - 47.0 % Final  . MCV 09/20/2014 102.7*  80.0 -  100.0 fL Final  . MCH 09/20/2014 34.5* 26.0 - 34.0 pg Final  . MCHC 09/20/2014 33.6  32.0 - 36.0 g/dL Final  . RDW 09/20/2014 20.0* 11.5 - 14.5 % Final  . Platelets 09/20/2014 199  150 - 440 K/uL Final  . Neutrophils Relative % 09/20/2014 67%   Final  . Neutro Abs 09/20/2014 5.0  1.4 - 6.5 K/uL Final  . Lymphocytes Relative 09/20/2014 19%   Final  . Lymphs Abs 09/20/2014 1.3  1.0 - 3.6 K/uL Final  . Monocytes Relative 09/20/2014 10%   Final  . Monocytes Absolute 09/20/2014 0.7  0.2 - 0.9 K/uL Final  . Eosinophils Relative 09/20/2014 3%   Final  . Eosinophils Absolute 09/20/2014 0.2  0 - 0.7 K/uL Final  . Basophils Relative 09/20/2014 1%   Final  . Basophils Absolute 09/20/2014 0.1  0 - 0.1 K/uL Final  . Sodium 09/20/2014 136  135 - 145 mmol/L Final  . Potassium 09/20/2014 3.6  3.5 - 5.1 mmol/L Final  . Chloride 09/20/2014 101  101 - 111 mmol/L Final  . CO2 09/20/2014 29  22 - 32 mmol/L Final  . Glucose, Bld 09/20/2014 99  65 - 99 mg/dL Final  . BUN 09/20/2014 6  6 - 20 mg/dL Final  . Creatinine, Ser 09/20/2014 0.75  0.44 - 1.00 mg/dL Final  . Calcium 09/20/2014 8.8* 8.9 - 10.3 mg/dL Final  . GFR calc non Af Amer 09/20/2014 >60  >60 mL/min Final  . GFR calc Af Amer 09/20/2014 >60  >60 mL/min Final   Comment: (NOTE) The eGFR has been calculated using the CKD EPI equation. This calculation has not been validated in all clinical situations. eGFR's persistently <60 mL/min signify possible Chronic Kidney Disease.   . Anion gap 09/20/2014 6  5 - 15 Final    Assessment:  [name] is a 59 y.o. 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  Chest, abdomen, and pelvic CT scan on 05/19/2014 revealed no evidence of metastatic disease.  There was a 4 mm RUL pulmonary nodule.  There were borderline (13 mm) enlarged iliac nodes.   There was a 4.7 x 4.1 x 6.0 cm lower rectal and anal mass consistent with anal cancer.  She underwent diverting colostomy on 05/26/2014.    She is currently day 17 of cycle #2 concurrent chemotherapy (5FU and mitomycin-C) and radiation (06/29/2014 - 07/31/2014).  She missed 3 days of radiation (07/04-07/06/2014).  She has been on a radiation break since 08/23/2014 secondary to perirectal breakdown.  She began hyperbaric oxygen on 09/13/2014.  Cycle #2 was complicated by moderate thrombocytopenia.  Counts have recovered.  She has new macrocytic RBC indices.  Symptomatically, she is doing well.  Exam reveals dramatic improvement since hyperbaric oxygen.  Her potassium is normal on supplimentation.  Diarrhea has resolved.  Plan: 1.  Review labs from yesterday:  CBC with diff, BMP, retic. 2.  Continue potassium 20 meq a day. 3.  Continue hyperbaric oxygen.  Once complete, anticipate re-evaluation by Dr. Rexene Edison given prolonged delay in radiation. 4.  RTC in 1 week for labs (K, B12, folate, TSH). 5.  RTC in 2 weeks for MD assess and +/- labs (CBC with diff, BMP).   Lequita Asal, MD  09/21/2014, 1:20 PM

## 2014-09-21 NOTE — Progress Notes (Signed)
[name], [name] ([id number]) Visit Report for 09/20/2014 HBO Details [name] Date of Service: 09/20/2014 1:00 PM Patient Name: P. Patient Account Number: [id number] Medical Record Treating RN: [id number] Number: Other Clinician: Jacqulyn Bath Date of Birth/Sex: 03/08/1955 (59 y.o. Female) Treating BURNS III, Primary Care Physician: Clayborn Bigness Physician/Extender: Thayer Jew Referring Physician: Thomasenia Bottoms in Treatment: 4 HBO Treatment Course Details Treatment Course Ordering Physician: Christin Fudge 1 Number: HBO Treatment Start Date: 09/13/2014 Total Treatments 40 Ordered: HBO Indication: Late Effect of Radiation HBO Treatment Details Treatment Number: 6 Patient Type: Outpatient Chamber Type: Monoplace Chamber #: [id number] Treatment Protocol: 2.0 ATA with 90 minutes oxygen, and no air breaks Treatment Details Compression Rate Down: 1.5 psi / minute De-Compression Rate Up: 1.5 psi / minute Air breaks and breathing Compress Tx Pressure Decompress Decompress periods Begins Reached Begins Ends (leave unused spaces blank) Chamber Pressure 1 ATA 2.0 ATA - - - - - - 2.0 ATA 1 ATA Clock Time (24 hr) 13:00 13:12 - - - - - - 14:42 14:53 Treatment Length: 113 (minutes) Treatment Segments: 4 Capillary Blood Glucose Pre Capillary Blood Glucose (mg/dl): Post Capillary Blood Glucose (mg/dl): Vital Signs Capillary Blood Glucose Reference Range: 80 - 120 mg / dl HBO Diabetic Blood Glucose Intervention Range: <131 mg/dl or >249 mg/dl Time Vitals Blood Respiratory Capillary Blood Glucose Pulse Action Type: Pulse: Temperature: Taken: Pressure: Rate: Glucose (mg/dl): Meter #: Oximetry (%) Taken: Pre 12:37 134/80 90 18 98.6 Post 14:50 124/72 84 18 98.1 Treatment Response [name], [name] ([id number]) Treatment Completion Status: Treatment Completed without Adverse Event HBO Attestation I certify that I supervised this HBO treatment in accordance with  Medicare guidelines. A trained Yes emergency response team is readily available per hospital policies and procedures. Continue HBOT as ordered. Yes Electronic Signature(s) Signed: 09/20/2014 4:56:52 PM By: Loletha Grayer MD Entered By: Loletha Grayer on 09/20/2014 15:08:34 [name], [name] ([id number]) -------------------------------------------------------------------------------- HBO Safety Checklist Details [name] Date of Service: 09/20/2014 1:00 PM Patient Name: P. Patient Account Number: [id number] Medical Record Treating RN: [id number] Number: Other Clinician: Jacqulyn Bath Date of Birth/Sex: Oct 11, 1955 (59 y.o. Female) Treating BURNS III, Primary Care Physician: Clayborn Bigness Physician/Extender: Thayer Jew Referring Physician: Clayborn Bigness Weeks in Treatment: 4 HBO Safety Checklist Items Safety Checklist Consent Form Signed Patient voided / foley secured and emptied When did you last eato 06:30 am Last dose of injectable or oral agent n/a Ostomy pouch emptied and vented if applicable NA All implantable devices assessed, documented and approved NA Intravenous access site secured and place Valuables secured Linens and cotton and cotton/polyester blend (less than 51% polyester) Personal oil-based products / skin lotions / body lotions removed Wigs or hairpieces removed Smoking or tobacco materials removed Books / newspapers / magazines / loose paper removed Cologne, aftershave, perfume and deodorant removed Jewelry removed (may wrap wedding band) Make-up removed Hair care products removed Battery operated devices (external) removed Heating patches and chemical warmers removed NA Titanium eyewear removed NA Nail polish cured greater than 10 hours NA Casting material cured greater than 10 hours NA Hearing aids removed Loose dentures or partials removed NA Prosthetics have been removed Patient demonstrates correct use of air break device (if  applicable) Patient concerns have been addressed Patient grounding bracelet on and cord attached to chamber Specifics for Inpatients (complete in addition to above) Medication sheet sent with patient [name], NOTARIANNI. ([id number]) Intravenous medications needed or due during therapy sent with patient Drainage tubes (e.g. nasogastric tube or chest tube secured  and vented) Endotracheal or Tracheotomy tube secured Cuff deflated of air and inflated with saline Airway suctioned Electronic Signature(s) Signed: 09/20/2014 3:52:54 PM By: Lorine Bears RCP, RRT, CHT Entered By: Becky Sax, Amado Nash on 09/20/2014 12:47:22

## 2014-09-21 NOTE — Progress Notes (Signed)
Patient is here for follow-up. Patient states that her potassium has been running low and Dr. Mike Gip wanted to recheck it again. Patient denies any pain and states that overall she has been doing much much better.

## 2014-09-22 ENCOUNTER — Inpatient Hospital Stay: Payer: Commercial Managed Care - HMO

## 2014-09-22 ENCOUNTER — Ambulatory Visit: Payer: Commercial Managed Care - HMO

## 2014-09-22 ENCOUNTER — Encounter: Payer: Commercial Managed Care - HMO | Admitting: Surgery

## 2014-09-22 DIAGNOSIS — L98412 Non-pressure chronic ulcer of buttock with fat layer exposed: Secondary | ICD-10-CM | POA: Diagnosis not present

## 2014-09-22 NOTE — Progress Notes (Addendum)
Tracy, [name] ([id number]) Visit Report for 09/22/2014 HBO Details Tracy Picket Date of Service: 09/22/2014 1:00 PM Patient Name: P. Patient Account Number: [id number] Medical Record Treating RN: [id number] Number: Other Clinician: Jacqulyn Bath Date of Birth/Sex: 1955-12-14 (59 y.o. Female) Treating BURNS III, Primary Care Physician: Clayborn Bigness Physician/Extender: Thayer Jew Referring Physician: Thomasenia Bottoms in Treatment: 5 HBO Treatment Course Details Treatment Course Ordering Physician: Christin Fudge 1 Number: HBO Treatment Start Date: 09/13/2014 Total Treatments 40 Ordered: HBO Indication: Late Effect of Radiation HBO Treatment Details Treatment Number: 8 Patient Type: Outpatient Chamber Type: Monoplace Chamber #: [id number] Treatment Protocol: 2.0 ATA with 90 minutes oxygen, and no air breaks Treatment Details Compression Rate Down: 1.5 psi / minute De-Compression Rate Up: 1.5 psi / minute Air breaks and breathing Compress Tx Pressure Decompress Decompress periods Begins Reached Begins Ends (leave unused spaces blank) Chamber Pressure 1 ATA 2.0 ATA - - - - - - 2.0 ATA 1 ATA Clock Time (24 hr) 12:25 12:37 - - - - - - 14:07 14:18 Treatment Length: 113 (minutes) Treatment Segments: 4 Capillary Blood Glucose Pre Capillary Blood Glucose (mg/dl): Post Capillary Blood Glucose (mg/dl): Vital Signs Capillary Blood Glucose Reference Range: 80 - 120 mg / dl HBO Diabetic Blood Glucose Intervention Range: <131 mg/dl or >249 mg/dl Time Vitals Blood Respiratory Capillary Blood Glucose Pulse Action Type: Pulse: Temperature: Taken: Pressure: Rate: Glucose (mg/dl): Meter #: Oximetry (%) Taken: Pre 12:05 122/84 84 18 98 Post 14:15 120/80 72 18 98.3 Treatment Response Tracy, [name] ([id number]) Treatment Completion Status: Treatment Completed without Adverse Event Electronic Signature(s) Signed: 09/22/2014 2:33:58 PM By: Lorine Bears RCP,  RRT, CHT Signed: 09/27/2014 2:28:31 PM By: Loletha Grayer MD Previous Signature: 09/22/2014 2:30:47 PM Version By: Christin Fudge MD, FACS Entered By: Lorine Bears on 09/22/2014 14:32:36 Tracy, [name] ([id number]) -------------------------------------------------------------------------------- HBO Safety Checklist Details Patient Name: Tracy Picket P. Date of Service: 09/22/2014 1:00 PM Medical Record Number: [id number] Patient Account Number: [id number] Date of Birth/Sex: 17-Jun-1955 (59 y.o. Female) Treating RN: Primary Care Physician: Clayborn Bigness Other Clinician: Jacqulyn Bath Referring Physician: Clayborn Bigness Treating Physician/Extender: Frann Rider in Treatment: 5 HBO Safety Checklist Items Safety Checklist Consent Form Signed Patient voided / foley secured and emptied When did you last eato 09:00 am Last dose of injectable or oral agent n/a Ostomy pouch emptied and vented if applicable NA All implantable devices assessed, documented and approved NA Intravenous access site secured and place Valuables secured Linens and cotton and cotton/polyester blend (less than 51% polyester) Personal oil-based products / skin lotions / body lotions removed Wigs or hairpieces removed Smoking or tobacco materials removed Books / newspapers / magazines / loose paper removed Cologne, aftershave, perfume and deodorant removed Jewelry removed (may wrap wedding band) Make-up removed Hair care products removed Battery operated devices (external) removed Heating patches and chemical warmers removed NA Titanium eyewear removed NA Nail polish cured greater than 10 hours NA Casting material cured greater than 10 hours NA Hearing aids removed Loose dentures or partials removed NA Prosthetics have been removed Patient demonstrates correct use of air break device (if applicable) Patient concerns have been addressed Patient grounding bracelet on and cord attached  to chamber Specifics for Inpatients (complete in addition to above) Medication sheet sent with patient Intravenous medications needed or due during therapy sent with patient Tracy, [name]. ([id number]) Drainage tubes (e.g. nasogastric tube or chest tube secured and vented) Endotracheal or Tracheotomy tube secured Cuff deflated of air and inflated  with saline Airway suctioned Electronic Signature(s) Signed: 09/22/2014 2:30:40 PM By: Christin Fudge MD, FACS Entered By: Christin Fudge on 09/22/2014 14:30:40

## 2014-09-22 NOTE — Progress Notes (Signed)
SONDA, COPPENS ([id number]) Visit Report for 09/22/2014 Arrival Information Details Patient Name: [name], [name]. Date of Service: 09/22/2014 1:00 PM Medical Record Number: [id number] Patient Account Number: [id number] Date of Birth/Sex: 1955-02-26 (59 y.o. Female) Treating RN: Primary Care Physician: Clayborn Bigness Other Clinician: Jacqulyn Bath Referring Physician: Clayborn Bigness Treating Physician/Extender: Frann Rider in Treatment: 5 Visit Information History Since Last Visit Added or deleted any medications: No Patient Arrived: Ambulatory Any new allergies or adverse reactions: No Arrival Time: 12:10 Signs or symptoms of abuse/neglect since last No Accompanied By: Ripon Driver Hospitalized since last visit: No Transfer Assistance: None Has Dressing in Place as Prescribed: Yes Patient Identification Verified: Yes Pain Present Now: No Secondary Verification Process Yes Completed: Patient Requires Transmission- No Based Precautions: Patient Has Alerts: No Electronic Signature(s) Signed: 09/22/2014 2:33:58 PM By: Lorine Bears RCP, RRT, CHT Entered By: Becky Sax, Amado Nash on 09/22/2014 12:27:00 [name] ([id number]) -------------------------------------------------------------------------------- Encounter Discharge Information Details Patient Name: [name]. Date of Service: 09/22/2014 1:00 PM Medical Record Number: [id number] Patient Account Number: [id number] Date of Birth/Sex: 10-Apr-1955 (59 y.o. Female) Treating RN: Primary Care Physician: Clayborn Bigness Other Clinician: Jacqulyn Bath Referring Physician: Clayborn Bigness Treating Physician/Extender: Frann Rider in Treatment: 5 Encounter Discharge Information Items Discharge Pain Level: 0 Discharge Condition: Stable Ambulatory Status: Ambulatory Discharge Destination: Home Transportation: Other [place] By: Lucianne Lei  Driver Schedule Follow-up Appointment: No Medication Reconciliation completed and provided to No Patient/Care Provider: Clinical Summary of Care: Notes Patient has an HBO treatment scheduled on 09/25/14 at 13:00 pm. Electronic Signature(s) Signed: 09/22/2014 2:33:58 PM By: Lorine Bears RCP, RRT, CHT Entered By: Becky Sax, Amado Nash on 09/22/2014 14:33:31 Chawla, [name] ([id number]) -------------------------------------------------------------------------------- Vitals Details Patient Name: [name]. Date of Service: 09/22/2014 1:00 PM Medical Record Number: [id number] Patient Account Number: [id number] Date of Birth/Sex: Mar 14, 1955 (58 y.o. Female) Treating RN: Primary Care Physician: Clayborn Bigness Other Clinician: Jacqulyn Bath Referring Physician: Clayborn Bigness Treating Physician/Extender: Frann Rider in Treatment: 5 Vital Signs Time Taken: 12:05 Temperature (F): 98.0 Height (in): 68 Pulse (bpm): 84 Weight (lbs): 183 Respiratory Rate (breaths/min): 18 Body Mass Index (BMI): 27.8 Blood Pressure (mmHg): 122/84 Reference Range: 80 - 120 mg / dl Electronic Signature(s) Signed: 09/22/2014 2:33:58 PM By: Lorine Bears RCP, RRT, CHT Entered By: Becky Sax, Amado Nash on 09/22/2014 12:27:41

## 2014-09-25 ENCOUNTER — Ambulatory Visit: Payer: Commercial Managed Care - HMO

## 2014-09-25 ENCOUNTER — Encounter: Payer: Commercial Managed Care - HMO | Admitting: Surgery

## 2014-09-25 ENCOUNTER — Inpatient Hospital Stay: Payer: Commercial Managed Care - HMO

## 2014-09-25 DIAGNOSIS — L98412 Non-pressure chronic ulcer of buttock with fat layer exposed: Secondary | ICD-10-CM | POA: Diagnosis not present

## 2014-09-25 NOTE — Progress Notes (Signed)
AKYRA, BOUCHIE ([id number]) Visit Report for 09/25/2014 Arrival Information Details Patient Name: [name], [name]. Date of Service: 09/25/2014 1:00 PM Medical Record Number: [id number] Patient Account Number: [id number] Date of Birth/Sex: 11-14-1955 (59 y.o. Female) Treating RN: Primary Care Physician: Clayborn Bigness Other Clinician: Jacqulyn Bath Referring Physician: Clayborn Bigness Treating Physician/Extender: Frann Rider in Treatment: 5 Visit Information History Since Last Visit Added or deleted any medications: No Patient Arrived: Ambulatory Any new allergies or adverse reactions: No Arrival Time: 12:30 Had a fall or experienced change in No Accompanied By: self activities of daily living that may affect Transfer Assistance: None risk of falls: Patient Identification Verified: Yes Hospitalized since last visit: No Secondary Verification Process Yes Has Dressing in Place as Prescribed: Yes Completed: Pain Present Now: No Patient Requires Transmission-Based No Precautions: Patient Has Alerts: No Electronic Signature(s) Signed: 09/25/2014 4:10:12 PM By: Lorine Bears RCP, RRT, CHT Entered By: Becky Sax, Amado Nash on 09/25/2014 12:56:59 Ace, [name] ([id number]) -------------------------------------------------------------------------------- Encounter Discharge Information Details Patient Name: [name]. Date of Service: 09/25/2014 1:00 PM Medical Record Number: [id number] Patient Account Number: [id number] Date of Birth/Sex: 01-12-1955 (59 y.o. Female) Treating RN: Primary Care Physician: Clayborn Bigness Other Clinician: Jacqulyn Bath Referring Physician: Clayborn Bigness Treating Physician/Extender: Frann Rider in Treatment: 5 Encounter Discharge Information Items Discharge Pain Level: 0 Discharge Condition: Stable Ambulatory Status: Ambulatory Discharge Destination: Home Transportation: Other [place] By: [name] Schedule Follow-up Appointment: No Medication Reconciliation completed and provided to No Patient/Care Provider: Clinical Summary of Care: Notes Patient has an HBO treatment scheduled on 09/26/14 at 13:00 PM. Electronic Signature(s) Signed: 09/25/2014 4:10:12 PM By: Lorine Bears RCP, RRT, CHT Entered By: Becky Sax, Amado Nash on 09/25/2014 14:52:52 Strang, [name] ([id number]) -------------------------------------------------------------------------------- Vitals Details Patient Name: [name]. Date of Service: 09/25/2014 1:00 PM Medical Record Number: [id number] Patient Account Number: [id number] Date of Birth/Sex: 1955-02-04 (59 y.o. Female) Treating RN: Primary Care Physician: Clayborn Bigness Other Clinician: Jacqulyn Bath Referring Physician: Clayborn Bigness Treating Physician/Extender: Frann Rider in Treatment: 5 Vital Signs Time Taken: 12:33 Temperature (F): 98.3 Height (in): 68 Pulse (bpm): 78 Weight (lbs): 183 Respiratory Rate (breaths/min): 18 Body Mass Index (BMI): 27.8 Blood Pressure (mmHg): 118/80 Reference Range: 80 - 120 mg / dl Electronic Signature(s) Signed: 09/25/2014 4:10:12 PM By: Lorine Bears RCP, RRT, CHT Entered By: Lorine Bears on 09/25/2014 12:57:54

## 2014-09-25 NOTE — Progress Notes (Signed)
AMARYA, KUEHL ([id number]) Visit Report for 09/25/2014 HBO Details Patient Name: [name], [name]. Date of Service: 09/25/2014 1:00 PM Medical Record Number: [id number] Patient Account Number: [id number] Date of Birth/Sex: Jan 09, 1955 (59 y.o. Female) Treating RN: Primary Care Physician: Clayborn Bigness Other Clinician: Jacqulyn Bath Referring Physician: Clayborn Bigness Treating Physician/Extender: Frann Rider in Treatment: 5 HBO Treatment Course Details Treatment Course Ordering Physician: Christin Fudge 1 Number: HBO Treatment Start Date: 09/13/2014 Total Treatments 40 Ordered: HBO Indication: Late Effect of Radiation HBO Treatment Details Treatment Number: 9 Patient Type: Outpatient Chamber Type: Monoplace Chamber #: [id number] Treatment Protocol: 2.0 ATA with 90 minutes oxygen, and no air breaks Treatment Details Compression Rate Down: 1.5 psi / minute De-Compression Rate Up: 1.5 psi / minute Air breaks and breathing Compress Tx Pressure Decompress Decompress periods Begins Reached Begins Ends (leave unused spaces blank) Chamber Pressure 1 ATA 2.0 ATA - - - - - - 2.0 ATA 1 ATA Clock Time (24 hr) 12:43 12:54 - - - - - - 14:24 14:35 Treatment Length: 112 (minutes) Treatment Segments: 4 Capillary Blood Glucose Pre Capillary Blood Glucose (mg/dl): Post Capillary Blood Glucose (mg/dl): Vital Signs Capillary Blood Glucose Reference Range: 80 - 120 mg / dl HBO Diabetic Blood Glucose Intervention Range: <131 mg/dl or >249 mg/dl Time Vitals Blood Respiratory Capillary Blood Glucose Pulse Action Type: Pulse: Temperature: Taken: Pressure: Rate: Glucose (mg/dl): Meter #: Oximetry (%) Taken: Pre 12:33 118/80 78 18 98.3 Post 14:40 115/66 66 18 98.4 Treatment Response Treatment Completion Status: Treatment Completed without Adverse Event [name], DETHLEFS. ([id number]) HBO Attestation I certify that I supervised this HBO treatment in accordance with Medicare  guidelines. A trained Yes emergency response team is readily available per hospital policies and procedures. Continue HBOT as ordered. Yes Electronic Signature(s) Signed: 09/25/2014 3:58:02 PM By: Christin Fudge MD, FACS Entered By: Christin Fudge on 09/25/2014 15:58:02 [name], [name] ([id number]) -------------------------------------------------------------------------------- HBO Safety Checklist Details Patient Name: Tracy Picket P. Date of Service: 09/25/2014 1:00 PM Medical Record Number: [id number] Patient Account Number: [id number] Date of Birth/Sex: 08/25/1955 (59 y.o. Female) Treating RN: Primary Care Physician: Clayborn Bigness Other Clinician: Jacqulyn Bath Referring Physician: Clayborn Bigness Treating Physician/Extender: Frann Rider in Treatment: 5 HBO Safety Checklist Items Safety Checklist Consent Form Signed Patient voided / foley secured and emptied When did you last eato 09:00 am Last dose of injectable or oral agent n/a Ostomy pouch emptied and vented if applicable NA All implantable devices assessed, documented and approved NA Intravenous access site secured and place Valuables secured Linens and cotton and cotton/polyester blend (less than 51% polyester) Personal oil-based products / skin lotions / body lotions removed Wigs or hairpieces removed Smoking or tobacco materials removed Books / newspapers / magazines / loose paper removed Cologne, aftershave, perfume and deodorant removed Jewelry removed (may wrap wedding band) Make-up removed Hair care products removed Battery operated devices (external) removed Heating patches and chemical warmers removed NA Titanium eyewear removed NA Nail polish cured greater than 10 hours NA Casting material cured greater than 10 hours NA Hearing aids removed Loose dentures or partials removed NA Prosthetics have been removed Patient demonstrates correct use of air break device (if applicable) Patient concerns  have been addressed Patient grounding bracelet on and cord attached to chamber Specifics for Inpatients (complete in addition to above) Medication sheet sent with patient Intravenous medications needed or due during therapy sent with patient [name], HUMES. ([id number]) Drainage tubes (e.g. nasogastric tube or chest tube secured and vented) Endotracheal  or Tracheotomy tube secured Cuff deflated of air and inflated with saline Airway suctioned Electronic Signature(s) Signed: 09/25/2014 4:10:12 PM By: Lorine Bears RCP, RRT, CHT Entered By: Becky Sax, Amado Nash on 09/25/2014 12:58:54

## 2014-09-26 ENCOUNTER — Encounter: Payer: Commercial Managed Care - HMO | Admitting: Surgery

## 2014-09-26 ENCOUNTER — Inpatient Hospital Stay: Payer: Commercial Managed Care - HMO

## 2014-09-26 ENCOUNTER — Ambulatory Visit: Payer: Commercial Managed Care - HMO

## 2014-09-26 DIAGNOSIS — L98412 Non-pressure chronic ulcer of buttock with fat layer exposed: Secondary | ICD-10-CM | POA: Diagnosis not present

## 2014-09-26 NOTE — Progress Notes (Signed)
[name] ([id number]) Visit Report for 09/26/2014 HBO Details Patient Name: [name]. Date of Service: 09/26/2014 1:00 PM Medical Record Number: [id number] Patient Account Number: [id number] Date of Birth/Sex: 1955/12/12 (59 y.o. Female) Treating RN: Primary Care Physician: Clayborn Bigness Other Clinician: Jacqulyn Bath Referring Physician: Clayborn Bigness Treating Physician/Extender: Frann Rider in Treatment: 5 HBO Treatment Course Details Treatment Course Ordering Physician: Christin Fudge 1 Number: HBO Treatment Start Date: 09/13/2014 Total Treatments 40 Ordered: HBO Indication: Late Effect of Radiation HBO Treatment Details Treatment Number: 10 Patient Type: Outpatient Chamber Type: Monoplace Chamber #: [id number] Treatment Protocol: 2.0 ATA with 90 minutes oxygen, and no air breaks Treatment Details Compression Rate Down: 1.5 psi / minute De-Compression Rate Up: 1.5 psi / minute Air breaks and breathing Compress Tx Pressure Decompress Decompress periods Begins Reached Begins Ends (leave unused spaces blank) Chamber Pressure 1 ATA 2.0 ATA - - - - - - 2.0 ATA 1 ATA Clock Time (24 hr) 12:47 12:58 - - - - - - 14:29 14:40 Treatment Length: 113 (minutes) Treatment Segments: 4 Capillary Blood Glucose Pre Capillary Blood Glucose (mg/dl): Post Capillary Blood Glucose (mg/dl): Vital Signs Capillary Blood Glucose Reference Range: 80 - 120 mg / dl HBO Diabetic Blood Glucose Intervention Range: <131 mg/dl or >249 mg/dl Time Vitals Blood Respiratory Capillary Blood Glucose Pulse Action Type: Pulse: Temperature: Taken: Pressure: Rate: Glucose (mg/dl): Meter #: Oximetry (%) Taken: Pre 12:35 142/80 84 18 98.3 Post 14:45 124/80 66 18 97.6 Treatment Response Treatment Completion Status: Treatment Completed without Adverse Event [name], [name]. ([id number]) HBO Attestation I certify that I supervised this HBO treatment in accordance with Medicare  guidelines. A trained Yes emergency response team is readily available per hospital policies and procedures. Continue HBOT as ordered. Yes Electronic Signature(s) Signed: 09/26/2014 3:06:44 PM By: Christin Fudge MD, FACS Previous Signature: 09/26/2014 1:13:39 PM Version By: Christin Fudge MD, FACS Entered By: Christin Fudge on 09/26/2014 15:06:43 [name], [name] ([id number]) -------------------------------------------------------------------------------- HBO Safety Checklist Details Patient Name: [name], [name] Date of Service: 09/26/2014 1:00 PM Medical Record Number: [id number] Patient Account Number: [id number] Date of Birth/Sex: 04-Oct-1955 (59 y.o. Female) Treating RN: Primary Care Physician: Clayborn Bigness Other Clinician: Jacqulyn Bath Referring Physician: Clayborn Bigness Treating Physician/Extender: Frann Rider in Treatment: 5 HBO Safety Checklist Items Safety Checklist Consent Form Signed Patient voided / foley secured and emptied When did you last eato 10:00 am Last dose of injectable or oral agent n/a Ostomy pouch emptied and vented if applicable NA All implantable devices assessed, documented and approved NA Intravenous access site secured and place Valuables secured Linens and cotton and cotton/polyester blend (less than 51% polyester) Personal oil-based products / skin lotions / body lotions removed Wigs or hairpieces removed Smoking or tobacco materials removed Books / newspapers / magazines / loose paper removed Cologne, aftershave, perfume and deodorant removed Jewelry removed (may wrap wedding band) Make-up removed Hair care products removed Battery operated devices (external) removed Heating patches and chemical warmers removed NA Titanium eyewear removed NA Nail polish cured greater than 10 hours NA Casting material cured greater than 10 hours NA Hearing aids removed Loose dentures or partials removed NA Prosthetics have been removed Patient  demonstrates correct use of air break device (if applicable) Patient concerns have been addressed Patient grounding bracelet on and cord attached to chamber Specifics for Inpatients (complete in addition to above) Medication sheet sent with patient Intravenous medications needed or due during therapy sent with patient [name], [name]. ([id number]) Drainage  tubes (e.g. nasogastric tube or chest tube secured and vented) Endotracheal or Tracheotomy tube secured Cuff deflated of air and inflated with saline Airway suctioned Electronic Signature(s) Signed: 09/26/2014 3:20:34 PM By: Lorine Bears RCP, RRT, CHT Entered By: Becky Sax, Amado Nash on 09/26/2014 12:51:02

## 2014-09-26 NOTE — Progress Notes (Signed)
NAZYIA, GAUGH ([id number]) Visit Report for 09/26/2014 Arrival Information Details Patient Name: [name], [name]. Date of Service: 09/26/2014 1:00 PM Medical Record Number: [id number] Patient Account Number: [id number] Date of Birth/Sex: [birth year] (59 y.o. Female) Treating RN: Primary Care Physician: Clayborn Bigness Other Clinician: Jacqulyn Bath Referring Physician: Clayborn Bigness Treating Physician/Extender: Frann Rider in Treatment: 5 Visit Information History Since Last Visit Added or deleted any medications: No Patient Arrived: Ambulatory Any new allergies or adverse reactions: No Arrival Time: 12:35 Had a fall or experienced change in No Accompanied By: self activities of daily living that may affect Transfer Assistance: None risk of falls: Patient Identification Verified: Yes Signs or symptoms of abuse/neglect since last No Secondary Verification Process Yes visito Completed: Hospitalized since last visit: No Patient Requires Transmission-Based No Has Dressing in Place as Prescribed: Yes Precautions: Pain Present Now: No Patient Has Alerts: No Electronic Signature(s) Signed: 09/26/2014 3:20:34 PM By: Lorine Bears RCP, RRT, CHT Entered By: Becky Sax, Amado Nash on 09/26/2014 12:49:50 [name], [name] ([id number]) -------------------------------------------------------------------------------- Encounter Discharge Information Details Patient Name: [name]. Date of Service: 09/26/2014 1:00 PM Medical Record Number: [id number] Patient Account Number: [id number] Date of Birth/Sex: 04-Jul-1955 (59 y.o. Female) Treating RN: Primary Care Physician: Clayborn Bigness Other Clinician: Jacqulyn Bath Referring Physician: Clayborn Bigness Treating Physician/Extender: Frann Rider in Treatment: 5 Encounter Discharge Information Items Discharge Pain Level: 0 Discharge Condition: Stable Ambulatory Status: Ambulatory Discharge  Destination: Home Transportation: Other [place] By: [name] Schedule Follow-up Appointment: No Medication Reconciliation completed and provided to No Patient/Care Provider: Clinical Summary of Care: Notes Patient has an HBO treatment scheduled on 09/27/14 at 13:00 pm. Electronic Signature(s) Signed: 09/26/2014 3:20:34 PM By: Lorine Bears RCP, RRT, CHT Entered By: Lorine Bears on 09/26/2014 14:49:12 Gann, Jerita Mamie Nick ([id number]) -------------------------------------------------------------------------------- Vitals Details Patient Name: [name]. Date of Service: 09/26/2014 1:00 PM Medical Record Number: [id number] Patient Account Number: [id number] Date of Birth/Sex: Jul 30, 1955 (59 y.o. Female) Treating RN: Primary Care Physician: Clayborn Bigness Other Clinician: Jacqulyn Bath Referring Physician: Clayborn Bigness Treating Physician/Extender: Frann Rider in Treatment: 5 Vital Signs Time Taken: 12:35 Temperature (F): 98.3 Height (in): 68 Pulse (bpm): 84 Weight (lbs): 183 Respiratory Rate (breaths/min): 18 Body Mass Index (BMI): 27.8 Blood Pressure (mmHg): 142/80 Reference Range: 80 - 120 mg / dl Electronic Signature(s) Signed: 09/26/2014 3:20:34 PM By: Lorine Bears RCP, RRT, CHT Entered By: Lorine Bears on 09/26/2014 12:50:16

## 2014-09-26 NOTE — Progress Notes (Addendum)
Tracy, [name] ([id number]) Visit Report for 09/26/2014 Chief Complaint Document Details Patient Name: [name], [name]. Date of Service: 09/26/2014 3:00 PM Medical Record Number: [id number] Patient Account Number: [id number] Date of Birth/Sex: 01/02/1956 (59 y.o. Female) Treating RN: Baruch Gouty, RN, BSN, Velva Harman Primary Care Physician: Clayborn Bigness Other Clinician: Referring Physician: Clayborn Bigness Treating Physician/Extender: Frann Rider in Treatment: 5 Information Obtained from: Patient Chief Complaint Patient presents to the wound care center for a consult due non healing wound. 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 Electronic Signature(s) Signed: 09/26/2014 3:01:39 PM By: Christin Fudge MD, FACS Entered By: Christin Fudge on 09/26/2014 15:01:38 [name] ([id number]) -------------------------------------------------------------------------------- HPI Details Patient Name: [name]. Date of Service: 09/26/2014 3:00 PM Medical Record Number: [id number] Patient Account Number: [id number] Date of Birth/Sex: November 07, 1955 (59 y.o. Female) Treating RN: Baruch Gouty, RN, BSN, Velva Harman Primary Care Physician: Clayborn Bigness Other Clinician: Referring Physician: Clayborn Bigness Treating Physician/Extender: Frann Rider in Treatment: 5 History of Present Illness Location: anal region and right gluteal area Quality: Patient reports experiencing a sharp pain to affected area(s). Severity: Patient states wound are getting worse. Duration: Patient has had the wound for < 2 weeks prior to presenting for treatment Timing: Pain in wound is constant (hurts all the time) Context: The wound occurred when the patient has been receiving radiation therapy for an anal cancer Modifying Factors: Other treatment(s) tried include:he has been using Silvadene ointment over the wound Associated Signs and Symptoms: Patient reports having difficulty  sitting for long periods. 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 She is receiving cycles of 5-FU and mitomycin-C with radiation. She has been sent to [name] for wound care for addition proctitis and a ulcerated area on the left gluteal region and the perianal vicinity. She tells me that her chemotherapy has been completed last week but she has taken a break from radiation therapy. She is also having some bleeding per rectum and some excoriation of skin around her low back and has significant symptoms of radiation proctitis. Past medical history significant for schizophrenia, asthma, GERD, anxiety depression, bipolar disorder, COPD, PTSD, DVT, peripheral neuropathy, anemia, obstructive sleep apnea. Past surgical history includes recent rectal biopsy, laparoscopic diverting colostomy, hemorrhoidectomy, Port-A-Cath placement. 08/24/2014 -- I spoke to Dr. Lorenda Cahill the medical oncologist on 08/18/14 and she did confirm that the patient has completed her course of chemotherapy and will not be receiving any chemotherapy now or in the near future. Dr. Donella Stade the radiation oncologist is away and I will talk to him on Monday and if he has no objections the patient will probably benefit from hyperbaric oxygen therapy. Radiation therapy had been started on 06/29/2014 and after confirming the present schedule for radiation we can then think about treating the patient with hyperbaric oxygen therapy once it has been discussed with Dr. Donella Stade. On 08/22/2014 I spoke to Dr. Donella Stade who confirmed that the patientos tumor was fairly large and after giving her radiation she has had a resulting open wound from the area where the tumor has shrunk. At this stage he has decided to stop the radiation therapy as he has achieved the desired result and would like me to go ahead with treating her for the  late effects of radiation. 09/12/2014 -- for various social reasons the patient has missed her appointments since 08/24/2014. She is doing fine otherwise  and seems to have recently seen a medical oncologist Dr. Susy Manor. Electronic Signature(s) Signed: 09/26/2014 3:02:19 PM By: Christin Fudge MD, FACS [name], [name] ([id number]) Entered By: Christin Fudge on 09/26/2014 15:02:18 [name], [name] ([id number]) -------------------------------------------------------------------------------- Physical Exam Details Patient Name: [name], [name] Date of Service: 09/26/2014 3:00 PM Medical Record Number: [id number] Patient Account Number: [id number] Date of Birth/Sex: [birth year] (59 y.o. Female) Treating RN: Baruch Gouty, RN, BSN, Velva Harman Primary Care Physician: Clayborn Bigness Other Clinician: Referring Physician: Clayborn Bigness Treating Physician/Extender: Frann Rider in Treatment: 5 Constitutional . Pulse regular. Respirations normal and unlabored. Afebrile. . Eyes Nonicteric. Reactive to light. Ears, Nose, Mouth, and Throat Lips, teeth, and gums WNL.Marland Kitchen Moist mucosa without lesions . Neck supple and nontender. No palpable supraclavicular or cervical adenopathy. Normal sized without goiter. Respiratory WNL. No retractions.. Cardiovascular Pedal Pulses WNL. No clubbing, cyanosis or edema. Lymphatic No adneopathy. No adenopathy. No adenopathy. Musculoskeletal Adexa without tenderness or enlargement.. Digits and nails w/o clubbing, cyanosis, infection, petechiae, ischemia, or inflammatory conditions.. Integumentary (Hair, Skin) No suspicious lesions. No crepitus or fluctuance. No peri-wound warmth or erythema. No masses.Marland Kitchen Psychiatric Judgement and insight Intact.. No evidence of depression, anxiety, or agitation.. Notes The wound has healthy granulation tissue and epithelium and does not have any surrounding cellulitis. The gluteal area is supple. Electronic Signature(s) Signed:  09/26/2014 3:03:30 PM By: Christin Fudge MD, FACS Entered By: Christin Fudge on 09/26/2014 15:03:29 [name] ([id number]) -------------------------------------------------------------------------------- Physician Orders Details Patient Name: [name]. Date of Service: 09/26/2014 3:00 PM Medical Record Number: [id number] Patient Account Number: [id number] Date of Birth/Sex: 12/03/1955 (59 y.o. Female) Treating RN: Afful, RN, BSN, Velva Harman Primary Care Physician: Clayborn Bigness Other Clinician: Referring Physician: Clayborn Bigness Treating Physician/Extender: Frann Rider in Treatment: 5 Verbal / Phone Orders: Yes Clinician: Afful, RN, BSN, Rita Read Back and Verified: Yes Diagnosis Coding Wound Cleansing Wound #1 Right,Medial Gluteus o Cleanse wound with mild soap and water Primary Wound Dressing Wound #1 Right,Medial Gluteus o Dry Gauze Dressing Change Frequency Wound #1 Right,Medial Gluteus o Change dressing every day. Follow-up Appointments Wound #1 Right,Medial Gluteus o Return Appointment in 1 week. Additional Orders / Instructions Wound #1 Right,Medial Gluteus o Other: - Patient instructed to use sanitary pad to control drainage and maintain dryness to perineum. Electronic Signature(s) Signed: 09/26/2014 3:11:54 PM By: Christin Fudge MD, FACS Signed: 09/26/2014 3:53:15 PM By: Regan Lemming BSN, RN Entered By: Regan Lemming on 09/26/2014 15:02:21 LARISSA, PEGG ([id number]) -------------------------------------------------------------------------------- Problem List Details Patient Name: [name], [name] Date of Service: 09/26/2014 3:00 PM Medical Record Number: [id number] Patient Account Number: [id number] Date of Birth/Sex: January 16, 1955 (59 y.o. 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 Date of Service: 09/26/2014 3:00 PM Medical Record Number: [id number] Patient Account Number: [id number] Date of Birth/Sex: December 31, 1955 (59 y.o. Female) Treating RN: Afful, RN, BSN,  Velva Harman Primary Care Physician: Clayborn Bigness Other Clinician: Referring Physician: Clayborn Bigness Treating Physician/Extender: Frann Rider in Treatment: 5 Subjective Chief Complaint Information obtained from Patient Patient presents to the wound care center for a consult due non healing wound. 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 History of Present Illness (HPI) The following HPI elements were documented for the patient's wound: Location: anal region and right gluteal area Quality: Patient reports experiencing a sharp pain to affected area(s). Severity: Patient states wound are getting worse. Duration: Patient has had the wound for < 2 weeks prior to presenting for treatment Timing: Pain in wound is constant (hurts all the time) Context: The wound occurred when the patient has been receiving radiation therapy for an anal cancer Modifying Factors: Other treatment(s) tried include:he has  been using Silvadene ointment over the wound Associated Signs and Symptoms: Patient reports having difficulty sitting for long periods. 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 She is receiving cycles of 5-FU and mitomycin-C with radiation. She has been sent to [name] for wound care for addition proctitis and a ulcerated area on the left gluteal region and the perianal vicinity. She tells me that her chemotherapy has been completed last week but she has taken a break from radiation therapy. She is also having some bleeding per rectum and some excoriation of skin around her low back and has significant symptoms of radiation proctitis. Past medical history significant for schizophrenia, asthma, GERD, anxiety depression, bipolar disorder, COPD, PTSD, DVT, peripheral neuropathy, anemia, obstructive sleep apnea. Past surgical history includes recent rectal biopsy, laparoscopic diverting colostomy, hemorrhoidectomy, Port-A-Cath placement. 08/24/2014 -- I spoke to Dr. Lorenda Cahill the medical oncologist on 08/18/14 and she did confirm that the patient has completed her course of chemotherapy and will not be receiving any chemotherapy now or in the near future. Dr. Donella Stade the radiation oncologist is away and I will talk to him on Monday and if he has no objections the patient will probably benefit from hyperbaric oxygen therapy. Radiation therapy had been started on 06/29/2014 and after confirming the present schedule for radiation we can then think about treating the patient with hyperbaric oxygen therapy once it has been discussed with Dr. Donella Stade. On 08/22/2014 I spoke to Dr. Donella Stade who confirmed that the patient s tumor was fairly large and after giving her radiation she has had a resulting open wound from the area where the tumor has shrunk. At this stage he has decided to stop the radiation therapy as  he has achieved the desired result and would like me to go [name], [name]. ([id number]) ahead with treating her for the late effects of radiation. 09/12/2014 -- for various social reasons the patient has missed her appointments since 08/24/2014. She is doing fine otherwise and seems to have recently seen a medical oncologist Dr. Susy Manor. Objective Constitutional Pulse regular. Respirations normal and unlabored. Afebrile. Vitals Time Taken: 2:50 PM, Height: 68 in, Weight: 183 lbs, BMI: 27.8, Temperature: 97.6 F, Pulse: 66 bpm, Respiratory Rate: 18 breaths/min, Blood Pressure: 124/80 mmHg. Eyes Nonicteric. Reactive to light. Ears, Nose, Mouth, and Throat Lips, teeth, and gums WNL.Marland Kitchen Moist mucosa without lesions . Neck supple and nontender. No palpable supraclavicular or cervical adenopathy. Normal sized without goiter. Respiratory WNL. No retractions.. Cardiovascular Pedal Pulses WNL. No clubbing, cyanosis or edema. Lymphatic No adneopathy. No adenopathy. No adenopathy. Musculoskeletal Adexa without  tenderness or enlargement.. Digits and nails w/o clubbing, cyanosis, infection, petechiae, ischemia, or inflammatory conditions.Marland Kitchen Psychiatric Judgement and insight Intact.. No evidence of depression, anxiety, or agitation.. General Notes: The wound has healthy granulation tissue and epithelium and does not have any surrounding cellulitis. The gluteal area is supple. Integumentary (Hair, Skin) [name], [name] ([id number]) No suspicious lesions. No crepitus or fluctuance. No peri-wound warmth or erythema. No masses.. Wound #1 status is Open. Original cause of wound was Radiation Burn. The wound is located on the Right,Medial Gluteus. The wound measures 2.5cm length x 1.8cm width x 0.3cm depth; 3.534cm^2 area and 1.06cm^3 volume. The wound is limited to skin breakdown. There is no tunneling or undermining noted. There is a medium amount of serosanguineous drainage noted. The wound  margin is distinct with the outline attached to the wound base. There is large (67-100%) red granulation within the wound bed. There is no necrotic tissue within the wound bed. The periwound skin appearance exhibited: Moist. The periwound skin appearance did not exhibit: Callus, Crepitus, Excoriation, Fluctuance, Friable, Induration, Localized Edema, Rash, Scarring, Dry/Scaly, Maceration, Atrophie Blanche, Cyanosis, Ecchymosis, Hemosiderin Staining, Mottled, Pallor, Rubor, Erythema. Periwound temperature was noted as No Abnormality. The periwound has tenderness on palpation. 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 She is benefiting immensely from hyperbaric oxygen therapy and we will continue this. Plan Wound Cleansing: Wound #1 Right,Medial Gluteus: Cleanse wound with mild soap and water Primary Wound Dressing: Wound #1 Right,Medial Gluteus: Dry Gauze Dressing Change Frequency: Wound #1 Right,Medial Gluteus: Change dressing every day. Follow-up Appointments: [name], [name] ([id number]) Wound #1 Right,Medial Gluteus: Return Appointment in 1 week. Additional Orders / Instructions: Wound #1 Right,Medial Gluteus: Other: - Patient instructed to use sanitary pad to control drainage and maintain dryness to perineum. The wound is healing nicely and I have recommended a sanitary pad or moist gauze to be placed for comfort. She is benefiting immensely from hyperbaric oxygen therapy and we will continue this. Electronic Signature(s) Signed: 09/26/2014 3:04:24 PM By: Christin Fudge MD, FACS Entered By: Christin Fudge on 09/26/2014 15:04:24 [name] ([id number]) -------------------------------------------------------------------------------- SuperBill Details Patient Name: [name]. Date of Service: 09/26/2014 Medical Record Number: [id number] Patient Account Number: [id number] Date of Birth/Sex: 1955-06-30 (59 y.o. Female) Treating RN: Afful, RN, BSN, [place] Primary Care Physician: Clayborn Bigness Other Clinician: Referring Physician: Clayborn Bigness Treating Physician/Extender: Frann Rider in Treatment: 5 Diagnosis Coding ICD-10 Codes Code Description K62.7 Radiation proctitis C44.520 Squamous cell carcinoma of anal skin L98.412 Non-pressure chronic ulcer of buttock with fat layer exposed F17.218 Nicotine dependence, cigarettes, with other nicotine-induced disorders L59.9 Disorder of the skin and subcutaneous tissue related to radiation, unspecified Facility Procedures CPT4 Code: 35573220 Description: 25427 - WOUND CARE VISIT-LEV 2 EST PT Modifier: Quantity: 1 Physician Procedures CPT4 Code: 0623762 Description: 83151 - WC PHYS LEVEL 3 - EST PT ICD-10 Description Diagnosis K62.7 Radiation proctitis C44.520 Squamous cell carcinoma of anal skin L98.412 Non-pressure chronic ulcer of buttock with fat l Modifier: ayer exposed Quantity: 1 Electronic Signature(s) Signed: 09/26/2014 3:04:44 PM By: Christin Fudge MD, FACS Entered By: Christin Fudge on 09/26/2014 15:04:44

## 2014-09-27 ENCOUNTER — Encounter: Payer: Commercial Managed Care - HMO | Admitting: Surgery

## 2014-09-27 ENCOUNTER — Inpatient Hospital Stay: Payer: Commercial Managed Care - HMO

## 2014-09-27 ENCOUNTER — Ambulatory Visit: Payer: Commercial Managed Care - HMO

## 2014-09-27 ENCOUNTER — Other Ambulatory Visit: Payer: Self-pay | Admitting: *Deleted

## 2014-09-27 DIAGNOSIS — L98412 Non-pressure chronic ulcer of buttock with fat layer exposed: Secondary | ICD-10-CM | POA: Diagnosis not present

## 2014-09-27 DIAGNOSIS — C20 Malignant neoplasm of rectum: Secondary | ICD-10-CM

## 2014-09-27 NOTE — Progress Notes (Signed)
[name], [name] ([id number]) Visit Report for 09/27/2014 Arrival Information Details [name], [name] Date of Service: 09/27/2014 1:00 PM Patient Name: P. Patient Account Number: [id number] Medical Record Treating RN: [id number] Number: Other Clinician: Jacqulyn Bath Date of Birth/Sex: 16-Jul-1955 (59 y.o. Female) Treating BURNS III, Primary Care Physician: Clayborn Bigness Physician/Extender: Thayer Jew Referring Physician: Thomasenia Bottoms in Treatment: 5 Visit Information History Since Last Visit Added or deleted any medications: No Patient Arrived: Ambulatory Any new allergies or adverse reactions: No Arrival Time: 12:40 Had a fall or experienced change in No Accompanied By: [name] activities of daily living that may affect [name] risk of falls: Transfer Assistance: None Signs or symptoms of abuse/neglect since last No Patient Identification Verified: Yes visito Secondary Verification Process Yes Hospitalized since last visit: No Completed: Has Dressing in Place as Prescribed: Yes Patient Requires Transmission- No Pain Present Now: No Based Precautions: Patient Has Alerts: No Electronic Signature(s) Signed: 09/27/2014 3:14:29 PM By: Lorine Bears RCP, RRT, CHT Entered By: Becky Sax, Amado Nash on 09/27/2014 12:57:57 [name] ([id number]) -------------------------------------------------------------------------------- Encounter Discharge Information Details [name] Date of Service: 09/27/2014 1:00 PM Patient Name: P. Patient Account Number: [id number] Medical Record Treating RN: [id number] Number: Other Clinician: Jacqulyn Bath Date of Birth/Sex: [birth year] (59 y.o. Female) Treating BURNS III, Primary Care Physician: Clayborn Bigness Physician/Extender: Thayer Jew Referring Physician: Thomasenia Bottoms in Treatment: 5 Encounter Discharge Information Items Discharge Pain Level: 0 Discharge Condition: Stable Ambulatory  Status: Ambulatory Discharge Destination: Home Transportation: Other [name] Accompanied By: [name] Schedule Follow-up Appointment: No Medication Reconciliation completed and provided to No Patient/Care [name]: Clinical Summary of Care: Notes Patient has an HBO treatment scheduled on 09/28/14 at 13:00 pm. Electronic Signature(s) Signed: 09/27/2014 3:14:29 PM By: Lorine Bears RCP, RRT, CHT Entered By: Lorine Bears on 09/27/2014 15:12:19 [name] ([id number]) -------------------------------------------------------------------------------- Vitals Details [name] Date of Service: 09/27/2014 1:00 PM Patient Name: P. Patient Account Number: [id number] Medical Record Treating RN: [id number] Number: Other Clinician: Jacqulyn Bath Date of Birth/Sex: 01/30/1955 (60 y.o. Female) Treating BURNS III, Primary Care Physician: Clayborn Bigness Physician/Extender: Thayer Jew Referring Physician: Clayborn Bigness Weeks in Treatment: 5 Vital Signs Time Taken: 12:40 Temperature (F): 98.3 Height (in): 68 Pulse (bpm): 84 Weight (lbs): 183 Respiratory Rate (breaths/min): 18 Body Mass Index (BMI): 27.8 Blood Pressure (mmHg): 132/80 Reference Range: 80 - 120 mg / dl Electronic Signature(s) Signed: 09/27/2014 3:14:29 PM By: Lorine Bears RCP, RRT, CHT Entered By: Becky Sax, Amado Nash on 09/27/2014 12:58:43

## 2014-09-27 NOTE — Progress Notes (Signed)
[name] ([id number]) Visit Report for 09/26/2014 Arrival Information Details Patient Name: [name], [name]. Date of Service: 09/26/2014 3:00 PM Medical Record Number: [id number] Patient Account Number: [id number] Date of Birth/Sex: December 17, 1955 (59 y.o. Female) Treating RN: Afful, RN, BSN, [name] Primary Care Physician: [name] Other Clinician: Referring Physician: Clayborn [name] Treating Physician/Extender: [name] in Treatment: 5 Visit Information History Since Last Visit Any new allergies or adverse reactions: No Patient Arrived: Ambulatory Signs or symptoms of abuse/neglect since last No Arrival Time: 14:52 visito Accompanied By: self Hospitalized since last visit: No Transfer Assistance: None Has Dressing in Place as Prescribed: Yes Patient Identification Verified: Yes Pain Present Now: No Secondary Verification Process Yes Completed: Patient Requires Transmission-Based No Precautions: Patient Has Alerts: No Electronic Signature(s) Signed: 09/26/2014 3:53:15 PM By: [name] BSN, RN Entered By: [name] on 09/26/2014 14:53:15 [name] ([id number]) -------------------------------------------------------------------------------- Clinic Level of Care Assessment Details Patient Name: [name]. Date of Service: 09/26/2014 3:00 PM Medical Record Number: [id number] Patient Account Number: [id number] Date of Birth/Sex: Dec 19, 1955 (59 y.o. Female) Treating RN: Afful, RN, BSN, [name] Primary Care Physician: [name] Other Clinician: Referring Physician: Clayborn [name] Treating Physician/Extender: [name] in Treatment: 5 Clinic Level of Care Assessment Items TOOL 4 Quantity Score '[]'$  - Use when only an EandM is performed on FOLLOW-UP visit 0 ASSESSMENTS - Nursing Assessment / Reassessment X - Reassessment of Co-morbidities (includes updates in patient status) 1 10 X - Reassessment of Adherence to Treatment Plan 1  5 ASSESSMENTS - Wound and Skin Assessment / Reassessment X - Simple Wound Assessment / Reassessment - one wound 1 5 '[]'$  - Complex Wound Assessment / Reassessment - multiple wounds 0 '[]'$  - Dermatologic / Skin Assessment (not related to wound area) 0 ASSESSMENTS - Focused Assessment '[]'$  - Circumferential Edema Measurements - multi extremities 0 '[]'$  - Nutritional Assessment / Counseling / Intervention 0 '[]'$  - Lower Extremity Assessment (monofilament, tuning fork, pulses) 0 '[]'$  - Peripheral Arterial Disease Assessment (using hand held doppler) 0 ASSESSMENTS - Ostomy and/or Continence Assessment and Care '[]'$  - Incontinence Assessment and Management 0 '[]'$  - Ostomy Care Assessment and Management (repouching, etc.) 0 PROCESS - Coordination of Care X - Simple Patient / Family Education for ongoing care 1 15 '[]'$  - Complex (extensive) Patient / Family Education for ongoing care 0 '[]'$  - Staff obtains Programmer, systems, Records, Test Results / Process Orders 0 '[]'$  - Staff telephones HHA, Nursing Homes / Clarify orders / etc 0 '[]'$  - Routine Transfer to another Facility (non-emergent condition) 0 [name], [name]. ([id number]) '[]'$  - Routine Hospital Admission (non-emergent condition) 0 '[]'$  - New Admissions / Biomedical engineer / Ordering NPWT, Apligraf, etc. 0 '[]'$  - Emergency Hospital Admission (emergent condition) 0 '[]'$  - Simple Discharge Coordination 0 '[]'$  - Complex (extensive) Discharge Coordination 0 PROCESS - Special Needs '[]'$  - Pediatric / Minor Patient Management 0 '[]'$  - Isolation Patient Management 0 '[]'$  - Hearing / Language / Visual special needs 0 '[]'$  - Assessment of Community assistance (transportation, D/C planning, etc.) 0 '[]'$  - Additional assistance / Altered mentation 0 '[]'$  - Support Surface(s) Assessment (bed, cushion, seat, etc.) 0 INTERVENTIONS - Wound Cleansing / Measurement X - Simple Wound Cleansing - one wound 1 5 '[]'$  - Complex Wound Cleansing - multiple wounds 0 X - Wound Imaging (photographs - any  number of wounds) 1 5 '[]'$  - Wound Tracing (instead of photographs) 0 X - Simple Wound Measurement - one wound 1 5 '[]'$  - Complex Wound  Measurement - multiple wounds 0 INTERVENTIONS - Wound Dressings X - Small Wound Dressing one or multiple wounds 1 10 '[]'$  - Medium Wound Dressing one or multiple wounds 0 '[]'$  - Large Wound Dressing one or multiple wounds 0 '[]'$  - Application of Medications - topical 0 '[]'$  - Application of Medications - injection 0 INTERVENTIONS - Miscellaneous '[]'$  - External ear exam 0 [name], [name] ([id number]) '[]'$  - Specimen Collection (cultures, biopsies, blood, body fluids, etc.) 0 '[]'$  - Specimen(s) / Culture(s) sent or taken to Lab for analysis 0 '[]'$  - Patient Transfer (multiple staff / Harrel Lemon Lift / Similar devices) 0 '[]'$  - Simple Staple / Suture removal (25 or less) 0 '[]'$  - Complex Staple / Suture removal (26 or more) 0 '[]'$  - Hypo / Hyperglycemic Management (close monitor of Blood Glucose) 0 '[]'$  - Ankle / Brachial Index (ABI) - do not check if billed separately 0 X - Vital Signs 1 5 Has the patient been seen at the hospital within the last three years: Yes Total Score: 65 Level Of Care: New/Established - Level 2 Electronic Signature(s) Signed: 09/26/2014 3:53:15 PM By: [name] BSN, RN Entered By: [name] on 09/26/2014 15:03:06 [name] ([id number]) -------------------------------------------------------------------------------- Encounter Discharge Information Details Patient Name: Tracy Picket P. Date of Service: 09/26/2014 3:00 PM Medical Record Number: [id number] Patient Account Number: [id number] Date of Birth/Sex: [birth year] (59 y.o. Female) Treating RN: Baruch Gouty, RN, BSN, [name] Primary Care Physician: [name] Other Clinician: Referring Physician: Clayborn [name] Treating Physician/Extender: [name] in Treatment: 5 Encounter Discharge Information Items Discharge Pain Level: 0 Discharge Condition: Stable Ambulatory Status:  Ambulatory Discharge Destination: Home Private Transportation: Auto Accompanied By: self Schedule Follow-up Appointment: No Medication Reconciliation completed and No provided to Patient/Care Tanay Misuraca: Clinical Summary of Care: Electronic Signature(s) Signed: 09/26/2014 3:53:15 PM By: [name] BSN, RN Entered By: [name] on 09/26/2014 15:04:15 [name] ([id number]) -------------------------------------------------------------------------------- Lower Extremity Assessment Details Patient Name: Heinrichs, Arneshia P. Date of Service: 09/26/2014 3:00 PM Medical Record Number: [id number] Patient Account Number: [id number] Date of Birth/Sex: 1955-06-27 (59 y.o. Female) Treating RN: Afful, RN, BSN, [name] Primary Care Physician: [name] Other Clinician: Referring Physician: Clayborn [name] Treating Physician/Extender: [name] in Treatment: 5 Electronic Signature(s) Signed: 09/26/2014 3:53:15 PM By: [name] BSN, RN Entered By: [name] on 09/26/2014 14:53:32 Dray, [name] ([id number]) -------------------------------------------------------------------------------- Multi Wound Chart Details Patient Name: [name], [name] Date of Service: 09/26/2014 3:00 PM Medical Record Number: [id number] Patient Account Number: [id number] Date of Birth/Sex: January 03, 1956 (59 y.o. Female) Treating RN: Baruch Gouty, RN, BSN, [name] Primary Care Physician: [name] Other Clinician: Referring Physician: Clayborn [name] Treating Physician/Extender: [name] in Treatment: 5 Vital Signs Height(in): 68 Pulse(bpm): 66 Weight(lbs): 183 Blood Pressure 124/80 (mmHg): Body Mass Index(BMI): 28 Temperature(F): 97.6 Respiratory Rate 18 (breaths/min): Photos: [1:No Photos] [N/A:N/A] Wound Location: [1:Right Gluteus - Medial] [N/A:N/A] Wounding Event: [1:Radiation Burn] [N/A:N/A] Primary Etiology: [1:Necrosis (Radiation)] [N/A:N/A] Comorbid History: [1:Chronic  Obstructive Pulmonary Disease (COPD), Hypertension, Received Radiation] [N/A:N/A] Date Acquired: [1:05/24/2014] [N/A:N/A] Weeks of Treatment: [1:5] [N/A:N/A] Wound Status: [1:Open] [N/A:N/A] Measurements L x W x D 2.5x1.8x0.3 [N/A:N/A] (cm) Area (cm) : [1:3.534] [N/A:N/A] Volume (cm) : [1:1.06] [N/A:N/A] % Reduction in Area: [1:39.20%] [N/A:N/A] % Reduction in Volume: 8.80% [N/A:N/A] Classification: [1:Full Thickness Without Exposed Support Structures] [N/A:N/A] Exudate Amount: [1:Medium] [N/A:N/A] Exudate Type: [1:Serosanguineous] [N/A:N/A] Exudate Color: [1:red, brown] [N/A:N/A] Foul Odor After [1:Yes] [N/A:N/A] Cleansing: Odor Anticipated Due to No [N/A:N/A] Product Use: Wound Margin: [1:Distinct, outline attached] [N/A:N/A]  Granulation Amount: [1:Large (67-100%)] [N/A:N/A] Granulation Quality: [1:Red] [N/A:N/A] Necrotic Amount: None Present (0%) N/A N/A Exposed Structures: Fascia: No N/A N/A Fat: No Tendon: No Muscle: No Joint: No Bone: No Limited to Skin Breakdown Epithelialization: None N/A N/A Periwound Skin Texture: Edema: No N/A N/A Excoriation: No Induration: No Callus: No Crepitus: No Fluctuance: No Friable: No Rash: No Scarring: No Periwound Skin Moist: Yes N/A N/A Moisture: Maceration: No Dry/Scaly: No Periwound Skin Color: Atrophie Blanche: No N/A N/A Cyanosis: No Ecchymosis: No Erythema: No Hemosiderin Staining: No Mottled: No Pallor: No Rubor: No Temperature: No Abnormality N/A N/A Tenderness on Yes N/A N/A Palpation: Wound Preparation: Ulcer Cleansing: N/A N/A Rinsed/Irrigated with Saline Topical Anesthetic Applied: Other: lidocaine 4% Treatment Notes Electronic Signature(s) Signed: 09/26/2014 3:53:15 PM By: [name] BSN, RN Entered By: [name] on 09/26/2014 14:58:41 [name], [name] ([id number]) -------------------------------------------------------------------------------- Multi-Disciplinary Care Plan  Details Patient Name: [name]. Date of Service: 09/26/2014 3:00 PM Medical Record Number: [id number] Patient Account Number: [id number] Date of Birth/Sex: [birth year] (59 y.o. Female) Treating RN: Afful, RN, BSN, [name] Primary Care Physician: [name] Other Clinician: Referring Physician: Clayborn [name] Treating Physician/Extender: [name] in Treatment: 5 Active Inactive Abuse / Safety / Falls / Self Care Management Nursing Diagnoses: Impaired physical mobility Potential for falls Self care deficit: actual or potential Goals: Patient will remain injury free Date Initiated: 08/17/2014 Goal Status: Active Patient/caregiver will verbalize understanding of skin care regimen Date Initiated: 08/17/2014 Goal Status: Active Patient/caregiver will verbalize/demonstrate measure taken to improve self care Date Initiated: 08/17/2014 Goal Status: Active Patient/caregiver will verbalize/demonstrate measures taken to improve the patient's personal safety Date Initiated: 08/17/2014 Goal Status: Active Patient/caregiver will verbalize/demonstrate measures taken to prevent injury and/or falls Date Initiated: 08/17/2014 Goal Status: Active Patient/caregiver will verbalize/demonstrate understanding of what to do in case of emergency Date Initiated: 08/17/2014 Goal Status: Active Interventions: Assess fall risk on admission and as needed Assess: immobility, friction, shearing, incontinence upon admission and as needed Assess impairment of mobility on admission and as needed per policy Assess self care needs on admission and as needed Provide education on basic hygiene Provide education on fall prevention Provide education on personal and home safety DARLENE, BROZOWSKI ([id number]) Provide education on safe transfers Treatment Activities: Education provided on Basic Hygiene : 08/17/2014 Notes: Orientation to the Wound Care Program Nursing Diagnoses: Knowledge deficit related  to the wound healing center program Goals: Patient/caregiver will verbalize understanding of the [facility] Date Initiated: 08/17/2014 Goal Status: Active Interventions: Provide education on orientation to the wound center Notes: Wound/Skin Impairment Nursing Diagnoses: Impaired tissue integrity Knowledge deficit related to smoking impact on wound healing Knowledge deficit related to ulceration/compromised skin integrity Goals: Patient/caregiver will verbalize understanding of skin care regimen Date Initiated: 08/17/2014 Goal Status: Active Ulcer/skin breakdown will have a volume reduction of 30% by week 4 Date Initiated: 08/17/2014 Goal Status: Active Ulcer/skin breakdown will have a volume reduction of 50% by week 8 Date Initiated: 08/17/2014 Goal Status: Active Ulcer/skin breakdown will have a volume reduction of 80% by week 12 Date Initiated: 08/17/2014 Goal Status: Active Ulcer/skin breakdown will heal within 14 weeks Date Initiated: 08/17/2014 Goal Status: Active Interventions: [name], [name] ([id number]) Assess patient/caregiver ability to obtain necessary supplies Assess patient/caregiver ability to perform ulcer/skin care regimen upon admission and as needed Assess ulceration(s) every visit Provide education on smoking Provide education on ulcer and skin care Notes: Electronic Signature(s) Signed: 09/26/2014 3:53:15 PM By: [name] BSN, RN Entered By:  [name] on 09/26/2014 14:57:51 [name], [name] ([id number]) -------------------------------------------------------------------------------- Pain Assessment Details Patient Name: [name], [name]. Date of Service: 09/26/2014 3:00 PM Medical Record Number: [id number] Patient Account Number: [id number] Date of Birth/Sex: 10/19/1955 (59 y.o. Female) Treating RN: Baruch Gouty, RN, BSN, [name] Primary Care Physician: [name] Other Clinician: Referring Physician: Clayborn [name] Treating  Physician/Extender: [name] in Treatment: 5 Active Problems Location of Pain Severity and Description of Pain Patient Has Paino No Site Locations Pain Management and Medication Current Pain Management: Electronic Signature(s) Signed: 09/26/2014 3:53:15 PM By: [name] BSN, RN Entered By: [name] on 09/26/2014 14:53:22 L[place], [name] ([id number]) -------------------------------------------------------------------------------- Patient/Caregiver Education Details Patient Name: [name]. Date of Service: 09/26/2014 3:00 PM Medical Record Number: [id number] Patient Account Number: [id number] Date of Birth/Gender: 26-Jan-1955 (59 y.o. Female) Treating RN: Baruch Gouty, RN, BSN, [name] Primary Care Physician: [name] Other Clinician: Referring Physician: Clayborn [name] Treating Physician/Extender: [name] in Treatment: 5 Education Assessment Education Provided To: Patient Education Topics Provided Basic Hygiene: Methods: Explain/Verbal Responses: State content correctly Safety: Methods: Explain/Verbal Responses: State content correctly Smoking and Wound Healing: Methods: Explain/Verbal Responses: State content correctly Welcome To [facility]: Methods: Explain/Verbal Responses: State content correctly Wound/Skin Impairment: Methods: Explain/Verbal Responses: State content correctly Electronic Signature(s) Signed: 09/26/2014 3:53:15 PM By: [name] BSN, RN Entered By: [name] on 09/26/2014 15:04:41 [name], [name] ([id number]) -------------------------------------------------------------------------------- Wound Assessment Details Patient Name: [name], [name] Date of Service: 09/26/2014 3:00 PM Medical Record Number: [id number] Patient Account Number: [id number] Date of Birth/Sex: 1955-07-14 (59 y.o. Female) Treating RN: Afful, RN, BSN, [place] Primary Care Physician: [name] Other Clinician: Referring Physician:  Clayborn [name] Treating Physician/Extender: [name] in Treatment: 5 Wound Status Wound Number: 1 Primary Necrosis (Radiation) Etiology: Wound Location: Right Gluteus - Medial Wound Open Wounding Event: Radiation Burn Status: Date Acquired: 05/24/2014 Comorbid Chronic Obstructive Pulmonary Weeks Of Treatment: 5 History: Disease (COPD), Hypertension, Clustered Wound: No Received Radiation Photos Photo Uploaded By: [name] on 09/26/2014 15:52:17 Wound Measurements Length: (cm) 2.5 Width: (cm) 1.8 Depth: (cm) 0.3 Area: (cm) 3.534 Volume: (cm) 1.06 % Reduction in Area: 39.2% % Reduction in Volume: 8.8% Epithelialization: None Tunneling: No Undermining: No Wound Description Full Thickness Without Exposed Classification: Support Structures Wound Margin: Distinct, outline attached Exudate Medium Amount: Exudate Type: Serosanguineous Exudate Color: red, brown Foul Odor After Cleansing: Yes Due to Product Use: No Wound Bed Granulation Amount: Large (67-100%) Exposed Structure Granulation Quality: Red Fascia Exposed: No [name], [name] P. ([id number]) Necrotic Amount: None Present (0%) Fat Layer Exposed: No Tendon Exposed: No Muscle Exposed: No Joint Exposed: No Bone Exposed: No Limited to Skin Breakdown Periwound Skin Texture Texture Color No Abnormalities Noted: No No Abnormalities Noted: No Callus: No Atrophie Blanche: No Crepitus: No Cyanosis: No Excoriation: No Ecchymosis: No Fluctuance: No Erythema: No Friable: No Hemosiderin Staining: No Induration: No Mottled: No Localized Edema: No Pallor: No Rash: No Rubor: No Scarring: No Temperature / Pain Moisture Temperature: No Abnormality No Abnormalities Noted: No Tenderness on Palpation: Yes Dry / Scaly: No Maceration: No Moist: Yes Wound Preparation Ulcer Cleansing: Rinsed/Irrigated with Saline Topical Anesthetic Applied: Other: lidocaine 4%, Treatment Notes Wound #1 (Right,  Medial Gluteus) 1. Cleansed with: Clean wound with Normal Saline 4. Dressing Applied: Dry Gauze Electronic Signature(s) Signed: 09/26/2014 3:53:15 PM By: [name] BSN, RN Entered By: [name] on 09/26/2014 14:57:45 [name], [name] ([id number]) -------------------------------------------------------------------------------- Vitals Details Patient Name: [name]. Date of Service: 09/26/2014 3:00  PM Medical Record Number: [id number] Patient Account Number: [id number] Date of Birth/Sex: [birth year] (59 y.o. Female) Treating RN: Afful, RN, BSN, [place] Primary Care Physician: [name] Other Clinician: Referring Physician: Clayborn [name] Treating Physician/Extender: [name] in Treatment: 5 Vital Signs Time Taken: 14:50 Temperature (F): 97.6 Height (in): 68 Pulse (bpm): 66 Weight (lbs): 183 Respiratory Rate (breaths/min): 18 Body Mass Index (BMI): 27.8 Blood Pressure (mmHg): 124/80 Reference Range: 80 - 120 mg / dl Electronic Signature(s) Signed: 09/26/2014 3:53:15 PM By: [name] BSN, RN Entered By: [name] on 09/26/2014 14:53:25

## 2014-09-27 NOTE — Telephone Encounter (Signed)
Will pick up tomorrow

## 2014-09-28 ENCOUNTER — Encounter: Payer: Self-pay | Admitting: Radiation Oncology

## 2014-09-28 ENCOUNTER — Other Ambulatory Visit: Payer: Commercial Managed Care - HMO

## 2014-09-28 ENCOUNTER — Other Ambulatory Visit: Payer: Self-pay

## 2014-09-28 ENCOUNTER — Inpatient Hospital Stay: Payer: Commercial Managed Care - HMO

## 2014-09-28 ENCOUNTER — Ambulatory Visit: Payer: Commercial Managed Care - HMO | Admitting: Radiation Oncology

## 2014-09-28 ENCOUNTER — Other Ambulatory Visit: Payer: Self-pay | Admitting: Hematology and Oncology

## 2014-09-28 ENCOUNTER — Ambulatory Visit: Payer: Commercial Managed Care - HMO

## 2014-09-28 ENCOUNTER — Telehealth: Payer: Self-pay

## 2014-09-28 ENCOUNTER — Ambulatory Visit
Admission: RE | Admit: 2014-09-28 | Discharge: 2014-09-28 | Disposition: A | Discharge status: Home / Self Care | Payer: Commercial Managed Care - HMO | Source: Ambulatory Visit | Attending: Radiation Oncology | Admitting: Radiation Oncology

## 2014-09-28 ENCOUNTER — Encounter: Payer: Commercial Managed Care - HMO | Admitting: Surgery

## 2014-09-28 VITALS — BP 139/90 | HR 90 | Temp 98.8°F | Resp 20 | Wt 183.3 lb

## 2014-09-28 DIAGNOSIS — C20 Malignant neoplasm of rectum: Secondary | ICD-10-CM

## 2014-09-28 DIAGNOSIS — D7589 Other specified diseases of blood and blood-forming organs: Secondary | ICD-10-CM

## 2014-09-28 DIAGNOSIS — L98412 Non-pressure chronic ulcer of buttock with fat layer exposed: Secondary | ICD-10-CM | POA: Diagnosis not present

## 2014-09-28 DIAGNOSIS — C210 Malignant neoplasm of anus, unspecified: Secondary | ICD-10-CM | POA: Diagnosis not present

## 2014-09-28 DIAGNOSIS — E876 Hypokalemia: Secondary | ICD-10-CM

## 2014-09-28 DIAGNOSIS — E538 Deficiency of other specified B group vitamins: Secondary | ICD-10-CM

## 2014-09-28 LAB — TSH: TSH: 3.304 u[IU]/mL (ref 0.350–4.500)

## 2014-09-28 LAB — FOLATE: Folate: 5.2 ng/mL — ABNORMAL LOW (ref >5.9)

## 2014-09-28 LAB — VITAMIN B12: Vitamin B-12: 169 pg/mL — ABNORMAL LOW (ref 180–914)

## 2014-09-28 LAB — POTASSIUM: Potassium: 3.4 mmol/L — ABNORMAL LOW (ref 3.5–5.1)

## 2014-09-28 MED ORDER — OXYCODONE-ACETAMINOPHEN 10-325 MG PO TABS: 1.0000 | ORAL_TABLET | Freq: Four times a day (QID) | ORAL | Status: DC | PRN

## 2014-09-28 NOTE — Progress Notes (Signed)
[name], [name] ([id number]) Visit Report for 09/27/2014 HBO Details [name] Date of Service: 09/27/2014 1:00 PM Patient Name: P. Patient Account Number: [id number] Medical Record Treating RN: [id number] Number: Other Clinician: Jacqulyn Bath Date of Birth/Sex: Oct 06, 1955 (59 y.o. Female) Treating BURNS III, Primary Care Physician: Clayborn Bigness Physician/Extender: Thayer Jew Referring Physician: Thomasenia Bottoms in Treatment: 5 HBO Treatment Course Details Treatment Course Ordering Physician: Christin Fudge 1 Number: HBO Treatment Start Date: 09/13/2014 Total Treatments 40 Ordered: HBO Indication: Late Effect of Radiation HBO Treatment Details Treatment Number: 11 Patient Type: Outpatient Chamber Type: Monoplace Chamber #: QJF#[id number] Treatment Protocol: 2.0 ATA with 90 minutes oxygen, and no air breaks Treatment Details Compression Rate Down: 1.5 psi / minute De-Compression Rate Up: 1.5 psi / minute Air breaks and breathing Compress Tx Pressure Decompress Decompress periods Begins Reached Begins Ends (leave unused spaces blank) Chamber Pressure 1 ATA 2.0 ATA - - - - - - 2.0 ATA 1 ATA Clock Time (24 hr) 12:56 13:08 - - - - - - 14:38 14:49 Treatment Length: 113 (minutes) Treatment Segments: 4 Capillary Blood Glucose Pre Capillary Blood Glucose (mg/dl): Post Capillary Blood Glucose (mg/dl): Vital Signs Capillary Blood Glucose Reference Range: 80 - 120 mg / dl HBO Diabetic Blood Glucose Intervention Range: <131 mg/dl or >249 mg/dl Time Vitals Blood Respiratory Capillary Blood Glucose Pulse Action Type: Pulse: Temperature: Taken: Pressure: Rate: Glucose (mg/dl): Meter #: Oximetry (%) Taken: Pre 12:40 132/80 84 18 98.3 Treatment Response Treatment Completion Status: Treatment Completed without Adverse Event [name], BUMP. ([id number]) HBO Attestation I certify that I supervised this HBO treatment in accordance with Medicare guidelines. A  trained Yes emergency response team is readily available per hospital policies and procedures. Continue HBOT as ordered. Yes Electronic Signature(s) Signed: 09/27/2014 4:26:09 PM By: Loletha Grayer MD Previous Signature: 09/27/2014 3:14:29 PM Version By: Lorine Bears RCP, RRT, CHT Previous Signature: 09/27/2014 2:28:31 PM Version By: Loletha Grayer MD Entered By: Loletha Grayer on 09/27/2014 16:03:04 [name], [name] ([id number]) -------------------------------------------------------------------------------- HBO Safety Checklist Details [name] Date of Service: 09/27/2014 1:00 PM Patient Name: P. Patient Account Number: [id number] Medical Record Treating RN: [id number] Number: Other Clinician: Jacqulyn Bath Date of Birth/Sex: [birth year] (59 y.o. Female) Treating BURNS III, Primary Care Physician: Clayborn Bigness Physician/Extender: Thayer Jew Referring Physician: Clayborn Bigness Weeks in Treatment: 5 HBO Safety Checklist Items Safety Checklist Consent Form Signed Patient voided / foley secured and emptied When did you last eato 09:00 am Last dose of injectable or oral agent n/a Ostomy pouch emptied and vented if applicable NA All implantable devices assessed, documented and approved NA Intravenous access site secured and place Valuables secured Linens and cotton and cotton/polyester blend (less than 51% polyester) Personal oil-based products / skin lotions / body lotions removed Wigs or hairpieces removed Smoking or tobacco materials removed Books / newspapers / magazines / loose paper removed Cologne, aftershave, perfume and deodorant removed Jewelry removed (may wrap wedding band) Make-up removed Hair care products removed Battery operated devices (external) removed Heating patches and chemical warmers removed NA Titanium eyewear removed NA Nail polish cured greater than 10 hours NA Casting material cured greater than 10 hours NA Hearing  aids removed Loose dentures or partials removed NA Prosthetics have been removed Patient demonstrates correct use of air break device (if applicable) Patient concerns have been addressed Patient grounding bracelet on and cord attached to chamber Specifics for Inpatients (complete in addition to above) Medication sheet sent with patient [name], [name] Intravenous  medications needed or due during therapy sent with patient Drainage tubes (e.g. nasogastric tube or chest tube secured and vented) Endotracheal or Tracheotomy tube secured Cuff deflated of air and inflated with saline Airway suctioned Electronic Signature(s) Signed: 09/27/2014 3:14:29 PM By: Lorine Bears RCP, RRT, CHT Entered By: Becky Sax, Amado Nash on 09/27/2014 12:59:24

## 2014-09-28 NOTE — Telephone Encounter (Signed)
Called pt per md request to have knowledge on potassium dose taking.  Pt did not answer.  Left message for her to return my call

## 2014-09-28 NOTE — Progress Notes (Signed)
Radiation Oncology Follow up Note  Name: [name]   Date:   09/28/2014 MRN:  [id number] DOB: Feb 14, 1955    This 59 y.o. female presents to the clinic today for locally advanced anal carcinoma.  REFERRING Jemel Ono: Lavera Guise, MD  HPI: Patient is a 59 year old female. Currently on treatment break having completed whole pelvic radiation therapy to 4500 cGy along with concurrent 5-FU and mitomycin-C. Her tumor expend extended into the soft tissue in perianal skin causing significant moist desquamation of the skin in that region for which she's been undergoing hyperbaric dives in the wound care center. That area is healing up nicely according to the patient. Her diarrhea has also improved she's feeling better than she has in a long time according to the patient. She is seen today for follow-up care and possible reinitiation of combined modality treatment.  COMPLICATIONS OF TREATMENT: present including extensive moist desquamation of skin involved in the perianal area with tumor currently on hyperbaric oxygen treatments  FOLLOW UP COMPLIANCE: keeps appointments   PHYSICAL EXAM:  BP 139/90 mmHg  Pulse 90  Temp(Src) 98.8 F (37.1 C)  Resp 20  Wt 183 lb 5 oz (83.15 kg) Examination of the anal area still shows open moist desquamation the perianal region although this is improving greatly from prior examination. Well-developed well-nourished patient in NAD. HEENT reveals PERLA, EOMI, discs not visualized.  Oral cavity is clear. No oral mucosal lesions are identified. Neck is clear without evidence of cervical or supraclavicular adenopathy. Lungs are clear to A&P. Cardiac examination is essentially unremarkable with regular rate and rhythm without murmur rub or thrill. Abdomen is benign with no organomegaly or masses noted. Motor sensory and DTR levels are equal and symmetric in the upper and lower extremities. Cranial nerves II through XII are grossly intact. Proprioception is intact. No  peripheral adenopathy or edema is identified. No motor or sensory levels are noted. Crude visual fields are within normal range.   RADIOLOGY RESULTS: No current films for review  PLAN: I have discussed the case with the wound care center as well as Dr. Mike Gip. Patient will complete her hyperbaric dives and we will reevaluate her at that time with surgical opinion for possible continuation of combined modality treatment. I've asked to see her back in 2 months when she should be complete with her hyperbaric oxygen treatments. She continues close follow-up care with medical oncology. Patient is to call our office sooner with any concerns.  I would like to take this opportunity for allowing me to participate in the care of your patient.Armstead Peaks., MD

## 2014-09-29 ENCOUNTER — Other Ambulatory Visit: Payer: Self-pay | Admitting: *Deleted

## 2014-09-29 ENCOUNTER — Inpatient Hospital Stay: Payer: Commercial Managed Care - HMO

## 2014-09-29 ENCOUNTER — Telehealth: Payer: Self-pay

## 2014-09-29 ENCOUNTER — Encounter: Payer: Commercial Managed Care - HMO | Admitting: Surgery

## 2014-09-29 ENCOUNTER — Ambulatory Visit: Payer: Commercial Managed Care - HMO

## 2014-09-29 DIAGNOSIS — L98412 Non-pressure chronic ulcer of buttock with fat layer exposed: Secondary | ICD-10-CM | POA: Diagnosis not present

## 2014-09-29 NOTE — Patient Outreach (Signed)
[facility]) Care Management  09/29/2014  KINA SHIFFMAN 07-Jun-1955 [id number]   Phone call to patient to follow up on continued social work needs.  Per patient, phone issues was the reason for the loss of contact.  Per patient, she now has a new working phone, same number.  Per patient, she did receive her hospital bed and has had it for approximately a month and 1/2.  Per patient, her cancer has resolved. She is feeling much better, more energy and increased mobility discussed.   Per patient, she is currently  undergoing hyperbaric dives through the wound care center ( 5 days a week for 2 hours)  for the next 6 weeks, then  will finish up her radiation treatments.  Per patient she has all of her medications, some coming from [facility] home delivery and the rest her daughter will pick up from the pharmacy.  Patient has an appointment with Dr. Jimmye Norman, Psychiatrist with [place] on 10/02/14 at 1 pm. [facility] will transport patient to this appointment. Patient reports improvement in mood, denies thoughts of harm to self or others.  This patient will be closed to social work at this time as patient reports having no further social work needs.  Patient urged to call this social worker back if any further needs arise. RNCM will be made aware of case closure.    [name] [facility] Care Management [phone]

## 2014-09-29 NOTE — Progress Notes (Signed)
[name] ([id number]) Visit Report for 09/28/2014 HBO Details Patient Name: [name], [name]. Date of Service: 09/28/2014 1:00 PM Medical Record Number: [id number] Patient Account Number: [id number] Date of Birth/Sex: 1955-09-15 (59 y.o. Female) Treating RN: Primary Care Physician: Clayborn Bigness Other Clinician: Jacqulyn Bath Referring Physician: Clayborn Bigness Treating Physician/Extender: Frann Rider in Treatment: 6 HBO Treatment Course Details Treatment Course Ordering Physician: Christin Fudge 1 Number: HBO Treatment Start Date: 09/13/2014 Total Treatments 40 Ordered: HBO Indication: Late Effect of Radiation HBO Treatment Details Treatment Number: 12 Patient Type: Outpatient Chamber Type: Monoplace Chamber #: [id number] Treatment Protocol: 2.0 ATA with 90 minutes oxygen, and no air breaks Treatment Details Compression Rate Down: 1.5 psi / minute De-Compression Rate Up: 1.5 psi / minute Air breaks and breathing Compress Tx Pressure Decompress Decompress periods Begins Reached Begins Ends (leave unused spaces blank) Chamber Pressure 1 ATA 2.0 ATA - - - - - - 2.0 ATA 1 ATA Clock Time (24 hr) 12:49 13:01 - - - - - - 14:32 14:43 Treatment Length: 114 (minutes) Treatment Segments: 4 Capillary Blood Glucose Pre Capillary Blood Glucose (mg/dl): Post Capillary Blood Glucose (mg/dl): Vital Signs Capillary Blood Glucose Reference Range: 80 - 120 mg / dl HBO Diabetic Blood Glucose Intervention Range: <131 mg/dl or >249 mg/dl Time Vitals Blood Respiratory Capillary Blood Glucose Pulse Action Type: Pulse: Temperature: Taken: Pressure: Rate: Glucose (mg/dl): Meter #: Oximetry (%) Taken: Pre 12:40 124/82 84 18 98 Post 14:50 122/92 72 18 98 Treatment Response Treatment Completion Status: Treatment Completed without Adverse Event [name]. ([id number]) HBO Attestation I certify that I supervised this HBO treatment in accordance with Medicare  guidelines. A trained Yes emergency response team is readily available per hospital policies and procedures. Continue HBOT as ordered. Yes Electronic Signature(s) Signed: 09/28/2014 3:39:58 PM By: Christin Fudge MD, FACS Entered By: Christin Fudge on 09/28/2014 15:39:58 [name], [name] ([id number]) -------------------------------------------------------------------------------- HBO Safety Checklist Details Patient Name: Tracy Picket P. Date of Service: 09/28/2014 1:00 PM Medical Record Number: [id number] Patient Account Number: [id number] Date of Birth/Sex: 02/06/1955 (59 y.o. Female) Treating RN: Primary Care Physician: Clayborn Bigness Other Clinician: Jacqulyn Bath Referring Physician: Clayborn Bigness Treating Physician/Extender: Frann Rider in Treatment: 6 HBO Safety Checklist Items Safety Checklist Consent Form Signed Patient voided / foley secured and emptied When did you last eato 07:30 am Last dose of injectable or oral agent n/a Ostomy pouch emptied and vented if applicable NA All implantable devices assessed, documented and approved NA Intravenous access site secured and place Valuables secured Linens and cotton and cotton/polyester blend (less than 51% polyester) Personal oil-based products / skin lotions / body lotions removed Wigs or hairpieces removed Smoking or tobacco materials removed Books / newspapers / magazines / loose paper removed Cologne, aftershave, perfume and deodorant removed Jewelry removed (may wrap wedding band) Make-up removed Hair care products removed Battery operated devices (external) removed Heating patches and chemical warmers removed NA Titanium eyewear removed NA Nail polish cured greater than 10 hours NA Casting material cured greater than 10 hours NA Hearing aids removed Loose dentures or partials removed NA Prosthetics have been removed Patient demonstrates correct use of air break device (if applicable) Patient concerns  have been addressed Patient grounding bracelet on and cord attached to chamber Specifics for Inpatients (complete in addition to above) Medication sheet sent with patient Intravenous medications needed or due during therapy sent with patient [name], [name]. ([id number]) Drainage tubes (e.g. nasogastric tube or chest tube secured and vented) Endotracheal  or Tracheotomy tube secured Cuff deflated of air and inflated with saline Airway suctioned Electronic Signature(s) Signed: 09/28/2014 4:17:55 PM By: Lorine Bears RCP, RRT, CHT Entered By: Lorine Bears on 09/28/2014 13:04:50

## 2014-09-29 NOTE — Telephone Encounter (Signed)
Called pt per MD request.  Pt is taking 20 meq of potassium a day currently.

## 2014-09-29 NOTE — Progress Notes (Signed)
[name], [name] ([id number]) Visit Report for 09/28/2014 Arrival Information Details Patient Name: [name], [name]. Date of Service: 09/28/2014 1:00 PM Medical Record Number: [id number] Patient Account Number: [id number] Date of Birth/Sex: 1955-11-18 (59 y.o. Female) Treating RN: Primary Care Physician: Clayborn Bigness Other Clinician: Jacqulyn Bath Referring Physician: Clayborn Bigness Treating Physician/Extender: Frann Rider in Treatment: 6 Visit Information History Since Last Visit Added or deleted any medications: No Patient Arrived: Ambulatory Any new allergies or adverse reactions: No Arrival Time: 12:35 Had a fall or experienced change in No Accompanied By: self activities of daily living that may affect Transfer Assistance: None risk of falls: Patient Identification Verified: Yes Signs or symptoms of abuse/neglect since last No Secondary Verification Process Yes visito Completed: Hospitalized since last visit: No Patient Requires Transmission-Based No Has Dressing in Place as Prescribed: Yes Precautions: Pain Present Now: No Patient Has Alerts: No Electronic Signature(s) Signed: 09/28/2014 4:17:55 PM By: Lorine Bears RCP, RRT, CHT Entered By: Lorine Bears on 09/28/2014 13:03:24 [name] ([id number]) -------------------------------------------------------------------------------- Encounter Discharge Information Details Patient Name: [name]. Date of Service: 09/28/2014 1:00 PM Medical Record Number: [id number] Patient Account Number: [id number] Date of Birth/Sex: [birth year] (59 y.o. Female) Treating RN: Primary Care Physician: Clayborn Bigness Other Clinician: Jacqulyn Bath Referring Physician: Clayborn Bigness Treating Physician/Extender: Frann Rider in Treatment: 6 Encounter Discharge Information Items Discharge Pain Level: 0 Discharge Condition: Stable Ambulatory Status: Ambulatory Discharge  Destination: Home Transportation: Other [place] By: [name] Schedule Follow-up Appointment: No Medication Reconciliation completed and provided to No Patient/Care Jaymien Landin: Clinical Summary of Care: Notes Patient has an HBO treatment scheduled on 09/29/14 at 13:00 pm. Electronic Signature(s) Signed: 09/28/2014 4:17:55 PM By: Lorine Bears RCP, RRT, CHT Entered By: Lorine Bears on 09/28/2014 15:19:57 [name], [name] ([id number]) -------------------------------------------------------------------------------- Vitals Details Patient Name: [name]. Date of Service: 09/28/2014 1:00 PM Medical Record Number: [id number] Patient Account Number: [id number] Date of Birth/Sex: 1955-12-12 (59 y.o. Female) Treating RN: Primary Care Physician: Clayborn Bigness Other Clinician: Jacqulyn Bath Referring Physician: Clayborn Bigness Treating Physician/Extender: Frann Rider in Treatment: 6 Vital Signs Time Taken: 12:40 Temperature (F): 98.0 Height (in): 68 Pulse (bpm): 84 Weight (lbs): 183 Respiratory Rate (breaths/min): 18 Body Mass Index (BMI): 27.8 Blood Pressure (mmHg): 124/82 Reference Range: 80 - 120 mg / dl Electronic Signature(s) Signed: 09/28/2014 4:17:55 PM By: Lorine Bears RCP, RRT, CHT Entered By: Lorine Bears on 09/28/2014 13:03:58

## 2014-09-30 NOTE — Progress Notes (Signed)
[name], [name] ([id number]) Visit Report for 09/29/2014 HBO Details Patient Name: [name], [name]. Date of Service: 09/29/2014 1:00 PM Medical Record Number: [id number] Patient Account Number: [id number] Date of Birth/Sex: 04-26-1955 (59 y.o. Female) Treating RN: Primary Care Physician: Clayborn Bigness Other Clinician: Jacqulyn Bath Referring Physician: Clayborn Bigness Treating Physician/Extender: Frann Rider in Treatment: 6 HBO Treatment Course Details Treatment Course Ordering Physician: Christin Fudge 1 Number: HBO Treatment Start Date: 09/13/2014 Total Treatments 40 Ordered: HBO Indication: Late Effect of Radiation HBO Treatment Details Treatment Number: 13 Patient Type: Outpatient Chamber Type: Monoplace Chamber #: [id number] Treatment Protocol: 2.0 ATA with 90 minutes oxygen, and no air breaks Treatment Details Compression Rate Down: 1.5 psi / minute De-Compression Rate Up: 1.5 psi / minute Air breaks and breathing Compress Tx Pressure Decompress Decompress periods Begins Reached Begins Ends (leave unused spaces blank) Chamber Pressure 1 ATA 2.0 ATA - - - - - - 2.0 ATA 1 ATA Clock Time (24 hr) 12:19 12:31 - - - - - - 14:01 14:13 Treatment Length: 114 (minutes) Treatment Segments: 4 Capillary Blood Glucose Pre Capillary Blood Glucose (mg/dl): Post Capillary Blood Glucose (mg/dl): Vital Signs Capillary Blood Glucose Reference Range: 80 - 120 mg / dl HBO Diabetic Blood Glucose Intervention Range: <131 mg/dl or >249 mg/dl Time Vitals Blood Respiratory Capillary Blood Glucose Pulse Action Type: Pulse: Temperature: Taken: Pressure: Rate: Glucose (mg/dl): Meter #: Oximetry (%) Taken: Pre 12:10 134/84 72 18 97.8 Post 14:15 112/68 60 18 98 Treatment Response Treatment Completion Status: Treatment Completed without Adverse Event [name], [name]. ([id number]) HBO Attestation I certify that I supervised this HBO treatment in accordance with Medicare  guidelines. A trained Yes emergency response team is readily available per hospital policies and procedures. Continue HBOT as ordered. Yes Electronic Signature(s) Signed: 09/29/2014 3:15:48 PM By: Christin Fudge MD, FACS Previous Signature: 09/29/2014 2:38:36 PM Version By: Lorine Bears RCP, RRT, CHT Entered By: Christin Fudge on 09/29/2014 15:15:48 [name] ([id number]) -------------------------------------------------------------------------------- HBO Safety Checklist Details Patient Name: [name] Date of Service: 09/29/2014 1:00 PM Medical Record Number: [id number] Patient Account Number: [id number] Date of Birth/Sex: [birth year] (59 y.o. Female) Treating RN: Primary Care Physician: Clayborn Bigness Other Clinician: Jacqulyn Bath Referring Physician: Clayborn Bigness Treating Physician/Extender: Frann Rider in Treatment: 6 HBO Safety Checklist Items Safety Checklist Consent Form Signed Patient voided / foley secured and emptied When did you last eato 09:00 am Last dose of injectable or oral agent n/a Ostomy pouch emptied and vented if applicable NA All implantable devices assessed, documented and approved NA Intravenous access site secured and place Valuables secured Linens and cotton and cotton/polyester blend (less than 51% polyester) Personal oil-based products / skin lotions / body lotions removed Wigs or hairpieces removed Smoking or tobacco materials removed Books / newspapers / magazines / loose paper removed Cologne, aftershave, perfume and deodorant removed Jewelry removed (may wrap wedding band) Make-up removed Hair care products removed Battery operated devices (external) removed Heating patches and chemical warmers removed NA Titanium eyewear removed NA Nail polish cured greater than 10 hours NA Casting material cured greater than 10 hours NA Hearing aids removed Loose dentures or partials removed NA Prosthetics have been  removed Patient demonstrates correct use of air break device (if applicable) Patient concerns have been addressed Patient grounding bracelet on and cord attached to chamber Specifics for Inpatients (complete in addition to above) Medication sheet sent with patient Intravenous medications needed or due during therapy sent with patient [name], [name] (  [id number]) Drainage tubes (e.g. nasogastric tube or chest tube secured and vented) Endotracheal or Tracheotomy tube secured Cuff deflated of air and inflated with saline Airway suctioned Electronic Signature(s) Signed: 09/29/2014 2:38:36 PM By: Lorine Bears RCP, RRT, CHT Entered By: Becky Sax, Amado Nash on 09/29/2014 12:23:52

## 2014-09-30 NOTE — Progress Notes (Signed)
Tracy, [name] ([id number]) Visit Report for 09/29/2014 Arrival Information Details Patient Name: [name], [name]. Date of Service: 09/29/2014 1:00 PM Medical Record Number: [id number] Patient Account Number: [id number] Date of Birth/Sex: 06-09-1955 (59 y.o. Female) Treating RN: Primary Care Physician: Clayborn Bigness Other Clinician: Jacqulyn Bath Referring Physician: Clayborn Bigness Treating Physician/Extender: Frann Rider in Treatment: 6 Visit Information History Since Last Visit Added or deleted any medications: No Patient Arrived: Ambulatory Any new allergies or adverse reactions: No Arrival Time: 12:10 Had a fall or experienced change in No Accompanied By: self activities of daily living that may affect Transfer Assistance: None risk of falls: Patient Identification Verified: Yes Signs or symptoms of abuse/neglect since last No Secondary Verification Process Yes visito Completed: Hospitalized since last visit: No Patient Requires Transmission-Based No Has Dressing in Place as Prescribed: Yes Precautions: Pain Present Now: No Patient Has Alerts: No Electronic Signature(s) Signed: 09/29/2014 2:38:36 PM By: Lorine Bears RCP, RRT, CHT Entered By: Lorine Bears on 09/29/2014 12:22:22 GIONNA, POLAK ([id number]) -------------------------------------------------------------------------------- Encounter Discharge Information Details Patient Name: [name]. Date of Service: 09/29/2014 1:00 PM Medical Record Number: [id number] Patient Account Number: [id number] Date of Birth/Sex: Jan 15, 1955 (59 y.o. Female) Treating RN: Primary Care Physician: Clayborn Bigness Other Clinician: Jacqulyn Bath Referring Physician: Clayborn Bigness Treating Physician/Extender: Frann Rider in Treatment: 6 Encounter Discharge Information Items Discharge Pain Level: 0 Discharge Condition: Stable Ambulatory Status: Ambulatory Discharge  Destination: Home Transportation: Other [place] By: [name] Schedule Follow-up Appointment: No Medication Reconciliation completed and provided to No Patient/Care Provider: Clinical Summary of Care: Notes Patient has an HBO treatment scheduled on 10/02/14 at 08:00 am. Electronic Signature(s) Signed: 09/29/2014 2:38:36 PM By: Lorine Bears RCP, RRT, CHT Entered By: Lorine Bears on 09/29/2014 14:38:13 Kadlec, [name] ([id number]) -------------------------------------------------------------------------------- Vitals Details Patient Name: [name]. Date of Service: 09/29/2014 1:00 PM Medical Record Number: [id number] Patient Account Number: [id number] Date of Birth/Sex: [birth year] (59 y.o. Female) Treating RN: Primary Care Physician: Clayborn Bigness Other Clinician: Jacqulyn Bath Referring Physician: Clayborn Bigness Treating Physician/Extender: Frann Rider in Treatment: 6 Vital Signs Time Taken: 12:10 Temperature (F): 97.8 Height (in): 68 Pulse (bpm): 72 Weight (lbs): 183 Respiratory Rate (breaths/min): 18 Body Mass Index (BMI): 27.8 Blood Pressure (mmHg): 134/84 Reference Range: 80 - 120 mg / dl Electronic Signature(s) Signed: 09/29/2014 2:38:36 PM By: Lorine Bears RCP, RRT, CHT Entered By: Becky Sax, Amado Nash on 09/29/2014 12:22:42

## 2014-10-02 ENCOUNTER — Inpatient Hospital Stay: Payer: Commercial Managed Care - HMO

## 2014-10-02 ENCOUNTER — Ambulatory Visit: Payer: Commercial Managed Care - HMO

## 2014-10-02 ENCOUNTER — Encounter: Payer: Commercial Managed Care - HMO | Admitting: Surgery

## 2014-10-02 DIAGNOSIS — L98412 Non-pressure chronic ulcer of buttock with fat layer exposed: Secondary | ICD-10-CM | POA: Diagnosis not present

## 2014-10-02 NOTE — Progress Notes (Signed)
GABY, HARNEY ([id number]) Visit Report for 10/02/2014 HBO Details Patient Name: [name], [name]. Date of Service: 10/02/2014 8:00 AM Medical Record Number: [id number] Patient Account Number: [id number] Date of Birth/Sex: 12/26/1955 (59 y.o. Female) Treating RN: Primary Care Physician: Clayborn Bigness Other Clinician: Jacqulyn Bath Referring Physician: Clayborn Bigness Treating Physician/Extender: Frann Rider in Treatment: 6 HBO Treatment Course Details Treatment Course Ordering Physician: Christin Fudge 1 Number: HBO Treatment Start Date: 09/13/2014 Total Treatments 40 Ordered: HBO Indication: Late Effect of Radiation HBO Treatment Details Treatment Number: 14 Patient Type: Outpatient Chamber Type: Monoplace Chamber #: [id number] Treatment Protocol: 2.0 ATA with 90 minutes oxygen, and no air breaks Treatment Details Compression Rate Down: 1.5 psi / minute De-Compression Rate Up: 1.5 psi / minute Air breaks and breathing Compress Tx Pressure Decompress Decompress periods Begins Reached Begins Ends (leave unused spaces blank) Chamber Pressure 1 ATA 2.0 ATA - - - - - - 2.0 ATA 1 ATA Clock Time (24 hr) 08:03 08:13 - - - - - - 09:44 09:54 Treatment Length: 111 (minutes) Treatment Segments: 4 Capillary Blood Glucose Pre Capillary Blood Glucose (mg/dl): Post Capillary Blood Glucose (mg/dl): Vital Signs Capillary Blood Glucose Reference Range: 80 - 120 mg / dl HBO Diabetic Blood Glucose Intervention Range: <131 mg/dl or >249 mg/dl Time Vitals Blood Respiratory Capillary Blood Glucose Pulse Action Type: Pulse: Temperature: Taken: Pressure: Rate: Glucose (mg/dl): Meter #: Oximetry (%) Taken: Pre 07:50 122/78 72 18 98 Post 09:55 122/80 78 18 98.2 HBO Attestation Yes Pfarr, Michalla P. ([id number]) I certify that I supervised this HBO treatment in accordance with Medicare guidelines. A trained emergency response team is readily available per hospital policies  and procedures. Continue HBOT as ordered. Yes Electronic Signature(s) Signed: 10/02/2014 11:37:14 AM By: Christin Fudge MD, FACS Previous Signature: 10/02/2014 11:06:48 AM Version By: Lorine Bears RCP, RRT, CHT Entered By: Christin Fudge on 10/02/2014 11:37:13 [name] ([id number]) -------------------------------------------------------------------------------- HBO Safety Checklist Details Patient Name: [name] Date of Service: 10/02/2014 8:00 AM Medical Record Number: [id number] Patient Account Number: [id number] Date of Birth/Sex: 28-May-1955 (59 y.o. Female) Treating RN: Primary Care Physician: Clayborn Bigness Other Clinician: Jacqulyn Bath Referring Physician: Clayborn Bigness Treating Physician/Extender: Frann Rider in Treatment: 6 HBO Safety Checklist Items Safety Checklist Consent Form Signed Patient voided / foley secured and emptied When did you last eato 22:30 pm on 10/01/14 Last dose of injectable or oral agent n/a Ostomy pouch emptied and vented if applicable NA All implantable devices assessed, documented and approved NA Intravenous access site secured and place Valuables secured Linens and cotton and cotton/polyester blend (less than 51% polyester) Personal oil-based products / skin lotions / body lotions removed Wigs or hairpieces removed Smoking or tobacco materials removed Books / newspapers / magazines / loose paper removed Cologne, aftershave, perfume and deodorant removed Jewelry removed (may wrap wedding band) Make-up removed Hair care products removed Battery operated devices (external) removed Heating patches and chemical warmers removed NA Titanium eyewear removed NA Nail polish cured greater than 10 hours NA Casting material cured greater than 10 hours NA Hearing aids removed Loose dentures or partials removed NA Prosthetics have been removed Patient demonstrates correct use of air break device (if  applicable) Patient concerns have been addressed Patient grounding bracelet on and cord attached to chamber Specifics for Inpatients (complete in addition to above) Medication sheet sent with patient Intravenous medications needed or due during therapy sent with patient [name], [name]. ([id number]) Drainage tubes (e.g. nasogastric tube or chest  tube secured and vented) Endotracheal or Tracheotomy tube secured Cuff deflated of air and inflated with saline Airway suctioned Electronic Signature(s) Signed: 10/02/2014 11:06:48 AM By: Lorine Bears RCP, RRT, CHT Entered By: Becky Sax, Amado Nash on 10/02/2014 08:06:43

## 2014-10-02 NOTE — Progress Notes (Signed)
ARIENNA, BENEGAS ([id number]) Visit Report for 10/02/2014 Arrival Information Details Patient Name: [name], [name]. Date of Service: 10/02/2014 8:00 AM Medical Record Number: [id number] Patient Account Number: [id number] Date of Birth/Sex: [birth year] (59 y.o. Female) Treating RN: Primary Care Physician: Clayborn Bigness Other Clinician: Jacqulyn Bath Referring Physician: Clayborn Bigness Treating Physician/Extender: Frann Rider in Treatment: 6 Visit Information History Since Last Visit Added or deleted any medications: No Patient Arrived: Ambulatory Any new allergies or adverse reactions: No Arrival Time: 07:50 Had a fall or experienced change in No Accompanied By: [place] activities of daily living that may affect [name] risk of falls: Transfer Assistance: None Signs or symptoms of abuse/neglect since last No Patient Identification Verified: Yes visito Secondary Verification Process Yes Hospitalized since last visit: No Completed: Has Dressing in Place as Prescribed: Yes Patient Requires Transmission- No Pain Present Now: No Based Precautions: Patient Has Alerts: No Electronic Signature(s) Signed: 10/02/2014 11:06:48 AM By: Lorine Bears RCP, RRT, CHT Entered By: Becky Sax, Amado Nash on 10/02/2014 08:05:09 [name] ([id number]) -------------------------------------------------------------------------------- Encounter Discharge Information Details Patient Name: [name]. Date of Service: 10/02/2014 8:00 AM Medical Record Number: [id number] Patient Account Number: [id number] Date of Birth/Sex: 05-Dec-1955 (59 y.o. Female) Treating RN: Primary Care Physician: Clayborn Bigness Other Clinician: Jacqulyn Bath Referring Physician: Clayborn Bigness Treating Physician/Extender: Frann Rider in Treatment: 6 Encounter Discharge Information Items Discharge Pain Level: 0 Discharge Condition: Stable Ambulatory Status:  Ambulatory Discharge Destination: Home Transportation: Other [place] By: [name] Schedule Follow-up Appointment: No Medication Reconciliation completed and provided to No Patient/Care Provider: Clinical Summary of Care: Electronic Signature(s) Signed: 10/02/2014 11:06:48 AM By: Lorine Bears RCP, RRT, CHT Entered By: Becky Sax, Amado Nash on 10/02/2014 10:51:59 Pundt, [name] ([id number]) -------------------------------------------------------------------------------- Vitals Details Patient Name: [name]. Date of Service: 10/02/2014 8:00 AM Medical Record Number: [id number] Patient Account Number: [id number] Date of Birth/Sex: [birth year] (59 y.o. Female) Treating RN: Primary Care Physician: Clayborn Bigness Other Clinician: Jacqulyn Bath Referring Physician: Clayborn Bigness Treating Physician/Extender: Frann Rider in Treatment: 6 Vital Signs Time Taken: 07:50 Temperature (F): 98.0 Height (in): 68 Pulse (bpm): 72 Weight (lbs): 183 Respiratory Rate (breaths/min): 18 Body Mass Index (BMI): 27.8 Blood Pressure (mmHg): 122/78 Reference Range: 80 - 120 mg / dl Electronic Signature(s) Signed: 10/02/2014 11:06:48 AM By: Lorine Bears RCP, RRT, CHT Entered By: Lorine Bears on 10/02/2014 08:05:38

## 2014-10-03 ENCOUNTER — Inpatient Hospital Stay: Payer: Commercial Managed Care - HMO

## 2014-10-03 ENCOUNTER — Other Ambulatory Visit: Payer: Self-pay | Admitting: *Deleted

## 2014-10-03 ENCOUNTER — Ambulatory Visit: Payer: Commercial Managed Care - HMO

## 2014-10-03 ENCOUNTER — Encounter: Payer: Commercial Managed Care - HMO | Admitting: Surgery

## 2014-10-03 ENCOUNTER — Telehealth: Payer: Self-pay

## 2014-10-03 DIAGNOSIS — L98412 Non-pressure chronic ulcer of buttock with fat layer exposed: Secondary | ICD-10-CM | POA: Diagnosis not present

## 2014-10-03 NOTE — Progress Notes (Signed)
[name] ([id number]) Visit Report for 10/03/2014 Arrival Information Details Patient Name: [name], [name]. Date of Service: 10/03/2014 1:00 PM Medical Record Number: [id number] Patient Account Number: [id number] Date of Birth/Sex: 10-03-1955 (59 y.o. Female) Treating RN: Primary Care Physician: Clayborn Bigness Other Clinician: Jacqulyn Bath Referring Physician: Clayborn Bigness Treating Physician/Extender: Frann Rider in Treatment: 6 Visit Information History Since Last Visit Added or deleted any medications: No Patient Arrived: Ambulatory Any new allergies or adverse reactions: No Arrival Time: 12:25 Had a fall or experienced change in No Accompanied By: [place] activities of daily living that may affect [name] risk of falls: Transfer Assistance: None Signs or symptoms of abuse/neglect since last No Patient Identification Verified: Yes visito Secondary Verification Process Yes Hospitalized since last visit: No Completed: Has Dressing in Place as Prescribed: Yes Patient Requires Transmission- No Pain Present Now: No Based Precautions: Patient Has Alerts: No Electronic Signature(s) Signed: 10/03/2014 3:00:55 PM By: Lorine Bears RCP, RRT, CHT Entered By: Becky Sax, Amado Nash on 10/03/2014 12:39:51 [name] ([id number]) -------------------------------------------------------------------------------- Encounter Discharge Information Details Patient Name: [name]. Date of Service: 10/03/2014 1:00 PM Medical Record Number: [id number] Patient Account Number: [id number] Date of Birth/Sex: [birth year] (59 y.o. Female) Treating RN: Primary Care Physician: Clayborn Bigness Other Clinician: Jacqulyn Bath Referring Physician: Clayborn Bigness Treating Physician/Extender: Frann Rider in Treatment: 6 Encounter Discharge Information Items Discharge Pain Level: 0 Discharge Condition: Stable Ambulatory Status:  Ambulatory Discharge Destination: Home Transportation: Other [place] Accompanied By: [place] Follow-up Appointment: No Medication Reconciliation completed No and provided to Patient/Care Provider: Clinical Summary of Care: Notes Patient has an HBO treatment scheduled on 10/04/14 at 13:00 am. Electronic Signature(s) Signed: 10/03/2014 3:00:55 PM By: Lorine Bears RCP, RRT, CHT Entered By: Lorine Bears on 10/03/2014 14:39:08 [name], [name] -------------------------------------------------------------------------------- Vitals Details Patient Name: [name]. Date of Service: 10/03/2014 1:00 PM Medical Record Number: [id number] Patient Account Number: [id number] Date of Birth/Sex: October 19, 1955 (59 y.o. Female) Treating RN: Primary Care Physician: Clayborn Bigness Other Clinician: Jacqulyn Bath Referring Physician: Clayborn Bigness Treating Physician/Extender: Frann Rider in Treatment: 6 Vital Signs Time Taken: 12:25 Temperature (F): 97.6 Height (in): 68 Pulse (bpm): 84 Weight (lbs): 183 Respiratory Rate (breaths/min): 18 Body Mass Index (BMI): 27.8 Blood Pressure (mmHg): 132/82 Reference Range: 80 - 120 mg / dl Electronic Signature(s) Signed: 10/03/2014 3:00:55 PM By: Lorine Bears RCP, RRT, CHT Entered By: Lorine Bears on 10/03/2014 12:40:31

## 2014-10-03 NOTE — Telephone Encounter (Signed)
Returned phone call to patient at this time. No answer. Left voicemail requesting return phone call.

## 2014-10-03 NOTE — Telephone Encounter (Signed)
Patient called at this time stating that she is having some difficulty with her Colostomy bag and needs to know what is going on. Requested nurse to call back.

## 2014-10-03 NOTE — Patient Outreach (Signed)
Another attempt (third) made to contact pt, check on status, nursing needs.   This RN CM was informed by [facility] SW spoke with pt last week, pt informed her was having phone issues, phone working now.   Per view in [place], [place] note for 9/23- per pt, phone issues reason for loss of contact.   HIPPA compliant voice message left with contact number.  Will try again if no response.     [name]   [place] [facility]  [phone]

## 2014-10-03 NOTE — Progress Notes (Signed)
DEANDA, RUDDELL ([id number]) Visit Report for 10/03/2014 HBO Details Patient Name: [name], [name]. Date of Service: 10/03/2014 1:00 PM Medical Record Number: [id number] Patient Account Number: [id number] Date of Birth/Sex: 1955/09/18 (59 y.o. Female) Treating RN: Primary Care Physician: Clayborn Bigness Other Clinician: Jacqulyn Bath Referring Physician: Clayborn Bigness Treating Physician/Extender: Frann Rider in Treatment: 6 HBO Treatment Course Details Treatment Course Ordering Physician: Christin Fudge 1 Number: HBO Treatment Start Date: 09/13/2014 Total Treatments 40 Ordered: HBO Indication: Late Effect of Radiation HBO Treatment Details Treatment Number: 15 Patient Type: Outpatient Chamber Type: Monoplace Chamber #: [id number] Treatment Protocol: 2.0 ATA with 90 minutes oxygen, and no air breaks Treatment Details Compression Rate Down: 1.5 psi / minute De-Compression Rate Up: 1.5 psi / minute Air breaks and breathing Compress Tx Pressure Decompress Decompress periods Begins Reached Begins Ends (leave unused spaces blank) Chamber Pressure 1 ATA 2.0 ATA - - - - - - 2.0 ATA 1 ATA Clock Time (24 hr) 12:37 12:50 - - - - - - 14:20 14:31 Treatment Length: 114 (minutes) Treatment Segments: 4 Capillary Blood Glucose Pre Capillary Blood Glucose (mg/dl): Post Capillary Blood Glucose (mg/dl): Vital Signs Capillary Blood Glucose Reference Range: 80 - 120 mg / dl HBO Diabetic Blood Glucose Intervention Range: <131 mg/dl or >249 mg/dl Time Vitals Blood Respiratory Capillary Blood Glucose Pulse Action Type: Pulse: Temperature: Taken: Pressure: Rate: Glucose (mg/dl): Meter #: Oximetry (%) Taken: Pre 12:25 132/82 84 18 97.6 Post 14:40 120/74 84 18 97.4 Treatment Response Treatment Completion Status: Treatment Completed without Adverse Event [name], [name]. ([id number]) HBO Attestation I certify that I supervised this HBO treatment in accordance with Medicare  guidelines. A trained Yes emergency response team is readily available per hospital policies and procedures. Continue HBOT as ordered. Yes Electronic Signature(s) Signed: 10/03/2014 2:49:37 PM By: Christin Fudge MD, FACS Entered By: Christin Fudge on 10/03/2014 14:49:37 [name], [name] ([id number]) -------------------------------------------------------------------------------- HBO Safety Checklist Details Patient Name: Tracy Picket P. Date of Service: 10/03/2014 1:00 PM Medical Record Number: [id number] Patient Account Number: [id number] Date of Birth/Sex: [birth year] (59 y.o. Female) Treating RN: Primary Care Physician: Clayborn Bigness Other Clinician: Jacqulyn Bath Referring Physician: Clayborn Bigness Treating Physician/Extender: Frann Rider in Treatment: 6 HBO Safety Checklist Items Safety Checklist Consent Form Signed Patient voided / foley secured and emptied When did you last eato 09:30 pm on 10/02/14 Last dose of injectable or oral agent n/a Ostomy pouch emptied and vented if applicable NA All implantable devices assessed, documented and approved NA Intravenous access site secured and place Valuables secured Linens and cotton and cotton/polyester blend (less than 51% polyester) Personal oil-based products / skin lotions / body lotions removed Wigs or hairpieces removed Smoking or tobacco materials removed Books / newspapers / magazines / loose paper removed Cologne, aftershave, perfume and deodorant removed Jewelry removed (may wrap wedding band) Make-up removed Hair care products removed Battery operated devices (external) removed Heating patches and chemical warmers removed NA Titanium eyewear removed NA Nail polish cured greater than 10 hours NA Casting material cured greater than 10 hours NA Hearing aids removed Loose dentures or partials removed NA Prosthetics have been removed Patient demonstrates correct use of air break device (if  applicable) Patient concerns have been addressed Patient grounding bracelet on and cord attached to chamber Specifics for Inpatients (complete in addition to above) Medication sheet sent with patient Intravenous medications needed or due during therapy sent with patient [name], [name]. ([id number]) Drainage tubes (e.g. nasogastric tube or chest tube secured and  vented) Endotracheal or Tracheotomy tube secured Cuff deflated of air and inflated with saline Airway suctioned Electronic Signature(s) Signed: 10/03/2014 3:00:55 PM By: Lorine Bears RCP, RRT, CHT Entered By: Lorine Bears on 10/03/2014 12:41:29

## 2014-10-04 ENCOUNTER — Encounter: Payer: Self-pay | Admitting: Psychiatry

## 2014-10-04 ENCOUNTER — Inpatient Hospital Stay: Payer: Commercial Managed Care - HMO

## 2014-10-04 ENCOUNTER — Telehealth: Payer: Self-pay | Admitting: *Deleted

## 2014-10-04 ENCOUNTER — Ambulatory Visit (INDEPENDENT_AMBULATORY_CARE_PROVIDER_SITE_OTHER): Payer: Commercial Managed Care - HMO | Admitting: Psychiatry

## 2014-10-04 ENCOUNTER — Ambulatory Visit: Payer: Commercial Managed Care - HMO

## 2014-10-04 ENCOUNTER — Encounter: Payer: Commercial Managed Care - HMO | Admitting: Surgery

## 2014-10-04 VITALS — BP 122/80 | HR 81 | Temp 97.8°F | Ht 66.5 in | Wt 189.8 lb

## 2014-10-04 DIAGNOSIS — F332 Major depressive disorder, recurrent severe without psychotic features: Secondary | ICD-10-CM | POA: Diagnosis not present

## 2014-10-04 DIAGNOSIS — L98412 Non-pressure chronic ulcer of buttock with fat layer exposed: Secondary | ICD-10-CM | POA: Diagnosis not present

## 2014-10-04 MED ORDER — ARIPIPRAZOLE 2 MG PO TABS: 2.0000 mg | ORAL_TABLET | Freq: Every day | ORAL | Status: DC

## 2014-10-04 MED ORDER — ESCITALOPRAM OXALATE 20 MG PO TABS: 20.0000 mg | ORAL_TABLET | Freq: Every day | ORAL | Status: DC

## 2014-10-04 MED ORDER — DULOXETINE HCL 60 MG PO CPEP: 60.0000 mg | ORAL_CAPSULE | Freq: Every day | ORAL | Status: DC

## 2014-10-04 MED ORDER — CLONAZEPAM 1 MG PO TABS: 1.0000 mg | ORAL_TABLET | Freq: Three times a day (TID) | ORAL | Status: DC | PRN

## 2014-10-04 MED ORDER — ZOLPIDEM TARTRATE 10 MG PO TABS: 10.0000 mg | ORAL_TABLET | Freq: Every evening | ORAL | Status: DC | PRN
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
## 2014-10-04 NOTE — Telephone Encounter (Signed)
  Pain med refill on Friday.  M

## 2014-10-04 NOTE — Telephone Encounter (Signed)
Needs pain med refill on Friday

## 2014-10-04 NOTE — Progress Notes (Signed)
[name], [name] ([id number]) Visit Report for 10/03/2014 Chief Complaint Document Details Patient Name: [name], [name]. Date of Service: 10/03/2014 3:00 PM Medical Record Number: [id number] Patient Account Number: [id number] Date of Birth/Sex: [birth year] (59 y.o. Female) Treating RN: Montey Hora Primary Care Physician: Clayborn Bigness Other Clinician: Referring Physician: Clayborn Bigness Treating Physician/Extender: Frann Rider in Treatment: 6 Information Obtained from: Patient Chief Complaint Patient presents to the wound care center for a consult due non healing wound. 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 Electronic Signature(s) Signed: 10/03/2014 3:34:10 PM By: Christin Fudge MD, FACS Entered By: Christin Fudge on 10/03/2014 15:34:10 [name] ([id number]) -------------------------------------------------------------------------------- HPI Details Patient Name: [name]. Date of Service: 10/03/2014 3:00 PM Medical Record Number: [id number] Patient Account Number: [id number] Date of Birth/Sex: [birth year] (59 y.o. Female) Treating RN: Montey Hora Primary Care Physician: Clayborn Bigness Other Clinician: Referring Physician: Clayborn Bigness Treating Physician/Extender: Frann Rider in Treatment: 6 History of Present Illness Location: anal region and right gluteal area Quality: Patient reports experiencing a sharp pain to affected area(s). Severity: Patient states wound are getting worse. Duration: Patient has had the wound for < 2 weeks prior to presenting for treatment Timing: Pain in wound is constant (hurts all the time) Context: The wound occurred when the patient has been receiving radiation therapy for an anal cancer Modifying Factors: Other treatment(s) tried include:he has been using Silvadene ointment over the wound Associated Signs and Symptoms: Patient reports having difficulty sitting for long  periods. 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 She is receiving cycles of 5-FU and mitomycin-C with radiation. She has been sent to [name] for wound care for addition proctitis and a ulcerated area on the left gluteal region and the perianal vicinity. She tells me that her chemotherapy has been completed last week but she has taken a break from radiation therapy. She is also having some bleeding per rectum and some excoriation of skin around her low back and has significant symptoms of radiation proctitis. Past medical history significant for schizophrenia, asthma, GERD, anxiety depression, bipolar disorder, COPD, PTSD, DVT, peripheral neuropathy, anemia, obstructive sleep apnea. Past surgical history includes recent rectal biopsy, laparoscopic diverting colostomy, hemorrhoidectomy, Port-A-Cath placement. 08/24/2014 -- I spoke to Dr. Lorenda Cahill the medical oncologist on 08/18/14 and she did confirm that the patient has completed her course of chemotherapy and will not be receiving any chemotherapy now or in the near future. Dr. Donella Stade the radiation oncologist is away and I will talk to him on Monday and if he has no objections the patient will probably benefit from hyperbaric oxygen therapy. Radiation therapy had been started on 06/29/2014 and after confirming the present schedule for radiation we can then think about treating the patient with hyperbaric oxygen therapy once it has been discussed with Dr. Donella Stade. On 08/22/2014 I spoke to Dr. Donella Stade who confirmed that the patientos tumor was fairly large and after giving her radiation she has had a resulting open wound from the area where the tumor has shrunk. At this stage he has decided to stop the radiation therapy as he has achieved the desired result and would like me to go ahead with treating her for the late effects of  radiation. 09/12/2014 -- for various social reasons the patient has missed her appointments since 08/24/2014. She is doing fine otherwise and seems to have  recently seen a medical oncologist Dr. Susy Manor. 10/03/2014 --she is doing very well with hyperbaric oxygen therapy and has good resolution of all her perianal symptoms. She had some anal seepage from the rectal stump and I explained the reason for this to her and she is rest assured. SHERON, TALLMAN ([id number]) Electronic Signature(s) Signed: 10/03/2014 3:35:32 PM By: Christin Fudge MD, FACS Entered By: Christin Fudge on 10/03/2014 15:35:31 MAYETTA, CASTLEMAN ([id number]) -------------------------------------------------------------------------------- Physical Exam Details Patient Name: [name], [name] Date of Service: 10/03/2014 3:00 PM Medical Record Number: [id number] Patient Account Number: [id number] Date of Birth/Sex: 06-Feb-1955 (59 y.o. Female) Treating RN: Montey Hora Primary Care Physician: Clayborn Bigness Other Clinician: Referring Physician: Clayborn Bigness Treating Physician/Extender: Frann Rider in Treatment: 6 Constitutional . Pulse regular. Respirations normal and unlabored. Afebrile. . Eyes Nonicteric. Reactive to light. Ears, Nose, Mouth, and Throat Lips, teeth, and gums WNL.Marland Kitchen Moist mucosa without lesions . Neck supple and nontender. No palpable supraclavicular or cervical adenopathy. Normal sized without goiter. Respiratory WNL. No retractions.. Cardiovascular Pedal Pulses WNL. No clubbing, cyanosis or edema. Lymphatic No adneopathy. No adenopathy. No adenopathy. Musculoskeletal Adexa without tenderness or enlargement.. Digits and nails w/o clubbing, cyanosis, infection, petechiae, ischemia, or inflammatory conditions.. Integumentary (Hair, Skin) No suspicious lesions. No crepitus or fluctuance. No peri-wound warmth or erythema. No masses.Marland Kitchen Psychiatric Judgement and insight Intact.. No evidence  of depression, anxiety, or agitation.. Notes The perianal wound has healthy granulation tissue and some surrounding coverage with epithelium. The gluteal area radiation changes are now subsiding. Electronic Signature(s) Signed: 10/03/2014 3:36:19 PM By: Christin Fudge MD, FACS Entered By: Christin Fudge on 10/03/2014 15:36:18 [name] ([id number]) -------------------------------------------------------------------------------- Physician Orders Details Patient Name: [name]. Date of Service: 10/03/2014 3:00 PM Medical Record Number: [id number] Patient Account Number: [id number] Date of Birth/Sex: 28-Sep-1955 (59 y.o. Female) Treating RN: Cornell Barman Primary Care Physician: Clayborn Bigness Other Clinician: Referring Physician: Clayborn Bigness Treating Physician/Extender: Frann Rider in Treatment: 6 Verbal / Phone Orders: Yes Clinician: Cornell Barman Read Back and Verified: Yes Diagnosis Coding Wound Cleansing Wound #1 Right,Medial Gluteus o Cleanse wound with mild soap and water Primary Wound Dressing Wound #1 Right,Medial Gluteus o Dry Gauze Dressing Change Frequency Wound #1 Right,Medial Gluteus o Change dressing every day. Follow-up Appointments Wound #1 Right,Medial Gluteus o Return Appointment in 1 week. Additional Orders / Instructions Wound #1 Right,Medial Gluteus o Other: - Patient instructed to use sanitary pad to control drainage and maintain dryness to perineum. Electronic Signature(s) Signed: 10/03/2014 3:53:46 PM By: Christin Fudge MD, FACS Signed: 10/03/2014 5:26:34 PM By: Gretta Cool RN, BSN, Kim RN, BSN Entered By: Gretta Cool, RN, BSN, Kim on 10/03/2014 15:23:06 PEONY, BARNER ([id number]) -------------------------------------------------------------------------------- Problem List Details Patient Name: STAR, RESLER P. Date of Service: 10/03/2014 3:00 PM Medical Record Number: [id number] Patient Account Number: [id number] Date of  Birth/Sex: 10-24-1955 (59 y.o. 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 ([id number]) --------------------------------------------------------------------------------  Progress Note Details Patient Name: LANDGREN, Willis P. Date of Service: 10/03/2014 3:00 PM Medical Record Number: [id number] Patient Account Number: [id number] Date of Birth/Sex: 1955-05-04 (59 y.o. Female) Treating RN: Montey Hora Primary Care Physician: Clayborn Bigness Other Clinician: Referring Physician: Clayborn Bigness Treating Physician/Extender: Frann Rider in Treatment: 6 Subjective Chief Complaint Information obtained from Patient Patient presents to the wound care center for a consult due non healing wound. 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 History of Present Illness (HPI) The following HPI elements were documented for the patient's wound: Location: anal region and right gluteal area Quality: Patient reports experiencing a sharp pain to affected area(s). Severity: Patient states wound are getting worse. Duration: Patient has had the wound for < 2 weeks prior to presenting for  treatment Timing: Pain in wound is constant (hurts all the time) Context: The wound occurred when the patient has been receiving radiation therapy for an anal cancer Modifying Factors: Other treatment(s) tried include:he has been using Silvadene ointment over the wound Associated Signs and Symptoms: Patient reports having difficulty sitting for long periods. 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 She is receiving cycles of 5-FU and mitomycin-C with radiation. She has been sent to [name] for wound care for addition proctitis and a ulcerated area on the left gluteal region and the perianal vicinity. She tells me that her chemotherapy has been completed last week but she has taken a break from radiation therapy. She is also having some bleeding per rectum and some excoriation of skin around her low back and has significant symptoms of radiation proctitis. Past medical history significant for schizophrenia, asthma, GERD, anxiety depression, bipolar disorder, COPD, PTSD, DVT, peripheral neuropathy, anemia, obstructive sleep apnea. Past surgical history includes recent rectal biopsy, laparoscopic diverting colostomy, hemorrhoidectomy, Port-A-Cath placement. 08/24/2014 -- I spoke to Dr. Lorenda Cahill the medical oncologist on 08/18/14 and she did confirm that the patient has completed her course of chemotherapy and will not be receiving any chemotherapy now or in the near future. Dr. Donella Stade the radiation oncologist is away and I will talk to him on Monday and if he has no objections the patient will probably benefit from hyperbaric oxygen therapy. Radiation therapy had been started on 06/29/2014 and after confirming the present schedule for radiation we can then think about treating the patient with hyperbaric oxygen therapy once it has been discussed with Dr. Donella Stade. On 08/22/2014 I spoke to Dr. Donella Stade  who confirmed that the patient s tumor was fairly large and after giving her radiation she has had a resulting open wound from the area where the tumor has shrunk. At this stage he has decided to stop the radiation therapy as he has achieved the desired result and would like me to go YUKO, COVENTRY. ([id number]) ahead with treating her for the late effects of radiation. 09/12/2014 -- for various social reasons the patient has missed her appointments since 08/24/2014. She is doing fine otherwise and seems to have recently seen a medical oncologist Dr. Susy Manor. 10/03/2014 --she is doing very well with hyperbaric oxygen therapy and has good resolution of all her perianal symptoms. She had some anal seepage from the rectal stump and I explained the reason for this to her and she is rest assured. Objective Constitutional Pulse regular. Respirations normal and unlabored. Afebrile. Vitals Time Taken: 2:40 PM, Height: 68 in, Weight: 183  lbs, BMI: 27.8, Temperature: 97.4 F, Pulse: 84 bpm, Respiratory Rate: 18 breaths/min, Blood Pressure: 120/74 mmHg. Eyes Nonicteric. Reactive to light. Ears, Nose, Mouth, and Throat Lips, teeth, and gums WNL.Marland Kitchen Moist mucosa without lesions . Neck supple and nontender. No palpable supraclavicular or cervical adenopathy. Normal sized without goiter. Respiratory WNL. No retractions.. Cardiovascular Pedal Pulses WNL. No clubbing, cyanosis or edema. Lymphatic No adneopathy. No adenopathy. No adenopathy. Musculoskeletal Adexa without tenderness or enlargement.. Digits and nails w/o clubbing, cyanosis, infection, petechiae, ischemia, or inflammatory conditions.Marland Kitchen Psychiatric Judgement and insight Intact.. No evidence of depression, anxiety, or agitation.. General Notes: The perianal wound has healthy granulation tissue and some surrounding coverage with epithelium. The gluteal area radiation changes are now subsiding. KERIANNA, RAWLINSON  ([id number]) Integumentary (Hair, Skin) No suspicious lesions. No crepitus or fluctuance. No peri-wound warmth or erythema. No masses.. Wound #1 status is Open. Original cause of wound was Radiation Burn. The wound is located on the Right,Medial Gluteus. The wound measures 2.5cm length x 1.4cm width x 0.3cm depth; 2.749cm^2 area and 0.825cm^3 volume. The wound is limited to skin breakdown. There is no tunneling or undermining noted. There is a medium amount of serosanguineous drainage noted. The wound margin is distinct with the outline attached to the wound base. There is large (67-100%) red granulation within the wound bed. There is no necrotic tissue within the wound bed. The periwound skin appearance exhibited: Moist. The periwound skin appearance did not exhibit: Callus, Crepitus, Excoriation, Fluctuance, Friable, Induration, Localized Edema, Rash, Scarring, Dry/Scaly, Maceration, Atrophie Blanche, Cyanosis, Ecchymosis, Hemosiderin Staining, Mottled, Pallor, Rubor, Erythema. Periwound temperature was noted as No Abnormality. The periwound has tenderness on palpation. 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 Follow-up Appointments: Wound #1 Right,Medial Gluteus: Return Appointment in 1 week. Additional Orders / Instructions: Wound #1 Right,Medial Gluteus: SUVI, ARCHULETTA. ([id number]) Other: - Patient instructed to use sanitary pad to  control drainage and maintain dryness to perineum. I have asked her to use some K-Y jelly and a sanitary pad and keep this area protected. Other than that she will continue with hyperbaric oxygen therapy which is helping her immensely. Electronic Signature(s) Signed: 10/03/2014 3:37:38 PM By: Christin Fudge MD, FACS Entered By: Christin Fudge on 10/03/2014 15:37:38 [name] ([id number]) -------------------------------------------------------------------------------- SuperBill Details Patient Name: [name]. Date of Service: 10/03/2014 Medical Record Number: [id number] Patient Account Number: [id number] Date of Birth/Sex: Mar 27, 1955 (59 y.o. Female) Treating RN: Montey Hora Primary Care Physician: Clayborn Bigness Other Clinician: Referring Physician: Clayborn Bigness Treating Physician/Extender: Frann Rider in Treatment: 6 Diagnosis Coding ICD-10 Codes Code Description K62.7 Radiation proctitis C44.520 Squamous cell carcinoma of anal skin L98.412 Non-pressure chronic ulcer of buttock with fat layer exposed F17.218 Nicotine dependence, cigarettes, with other nicotine-induced disorders L59.9 Disorder of the skin and subcutaneous tissue related to radiation, unspecified Facility Procedures CPT4 Code: [id number] Description: 50539 - WOUND CARE VISIT-LEV 2 EST PT Modifier: Quantity: 1 Physician Procedures CPT4: Description Modifier Quantity Code [id number] - WC PHYS LEVEL 3 - EST PT 1 ICD-10 Description Diagnosis K62.7 Radiation proctitis  C44.520 Squamous cell carcinoma of anal skin L98.412 Non-pressure chronic ulcer of buttock with fat layer exposed  L59.9 Disorder of the skin and subcutaneous tissue related to radiation, unspecified Electronic Signature(s) Signed: 10/03/2014 3:38:03 PM By: Christin Fudge MD, FACS Entered By: Christin Fudge on 10/03/2014 15:38:03

## 2014-10-04 NOTE — Progress Notes (Signed)
ESTHELA, BRANDNER ([id number]) Visit Report for 10/03/2014 Arrival Information Details Patient Name: [name], [name]. Date of Service: 10/03/2014 3:00 PM Medical Record Number: [id number] Patient Account Number: [id number] Date of Birth/Sex: 14-Sep-1955 (59 y.o. Female) Treating RN: [name] Primary Care Physician: [name] Other Clinician: Referring Physician: Clayborn [name] Treating Physician/Extender: [name] in Treatment: 6 Visit Information History Since Last Visit Added or deleted any medications: No Patient Arrived: Ambulatory Any new allergies or adverse reactions: No Arrival Time: 15:16 Had a fall or experienced change in No Accompanied By: self activities of daily living that may affect Transfer Assistance: None risk of falls: Patient Identification Verified: Yes Signs or symptoms of abuse/neglect since last No Secondary Verification Process Yes visito Completed: Hospitalized since last visit: No Patient Requires Transmission-Based No Has Dressing in Place as Prescribed: Yes Precautions: Pain Present Now: No Patient Has Alerts: No Electronic Signature(s) Signed: 10/03/2014 5:26:34 PM By: Tracy Cool, RN, BSN, Kim RN, BSN Entered By: Tracy Cool, RN, BSN, [name] on 10/03/2014 15:16:36 [name], [name] ([id number]) -------------------------------------------------------------------------------- Clinic Level of Care Assessment Details Patient Name: [name], [name] Date of Service: 10/03/2014 3:00 PM Medical Record Number: [id number] Patient Account Number: [id number] Date of Birth/Sex: 11-29-1955 (59 y.o. Female) Treating RN: [name] Primary Care Physician: [name] Other Clinician: Referring Physician: Clayborn [name] Treating Physician/Extender: [name] in Treatment: 6 Clinic Level of Care Assessment Items TOOL 4 Quantity Score '[]'$  - Use when only an EandM is performed on FOLLOW-UP visit 0 ASSESSMENTS - Nursing Assessment / Reassessment X  - Reassessment of Co-morbidities (includes updates in patient status) 1 10 X - Reassessment of Adherence to Treatment Plan 1 5 ASSESSMENTS - Wound and Skin Assessment / Reassessment X - Simple Wound Assessment / Reassessment - one wound 1 5 '[]'$  - Complex Wound Assessment / Reassessment - multiple wounds 0 '[]'$  - Dermatologic / Skin Assessment (not related to wound area) 0 ASSESSMENTS - Focused Assessment '[]'$  - Circumferential Edema Measurements - multi extremities 0 '[]'$  - Nutritional Assessment / Counseling / Intervention 0 '[]'$  - Lower Extremity Assessment (monofilament, tuning fork, pulses) 0 '[]'$  - Peripheral Arterial Disease Assessment (using hand held doppler) 0 ASSESSMENTS - Ostomy and/or Continence Assessment and Care '[]'$  - Incontinence Assessment and Management 0 '[]'$  - Ostomy Care Assessment and Management (repouching, etc.) 0 PROCESS - Coordination of Care X - Simple Patient / Family Education for ongoing care 1 15 '[]'$  - Complex (extensive) Patient / Family Education for ongoing care 0 '[]'$  - Staff obtains Programmer, systems, Records, Test Results / Process Orders 0 '[]'$  - Staff telephones HHA, Nursing Homes / Clarify orders / etc 0 '[]'$  - Routine Transfer to another Facility (non-emergent condition) 0 [name], [name]. ([id number]) '[]'$  - Routine Hospital Admission (non-emergent condition) 0 '[]'$  - New Admissions / Biomedical engineer / Ordering NPWT, Apligraf, etc. 0 '[]'$  - Emergency Hospital Admission (emergent condition) 0 X - Simple Discharge Coordination 1 10 '[]'$  - Complex (extensive) Discharge Coordination 0 PROCESS - Special Needs '[]'$  - Pediatric / Minor Patient Management 0 '[]'$  - Isolation Patient Management 0 '[]'$  - Hearing / Language / Visual special needs 0 '[]'$  - Assessment of Community assistance (transportation, D/C planning, etc.) 0 '[]'$  - Additional assistance / Altered mentation 0 '[]'$  - Support Surface(s) Assessment (bed, cushion, seat, etc.) 0 INTERVENTIONS - Wound Cleansing / Measurement X  - Simple Wound Cleansing - one wound 1 5 '[]'$  - Complex Wound Cleansing - multiple wounds 0 X - Wound Imaging (photographs - any number of  wounds) 1 5 '[]'$  - Wound Tracing (instead of photographs) 0 X - Simple Wound Measurement - one wound 1 5 '[]'$  - Complex Wound Measurement - multiple wounds 0 INTERVENTIONS - Wound Dressings X - Small Wound Dressing one or multiple wounds 1 10 '[]'$  - Medium Wound Dressing one or multiple wounds 0 '[]'$  - Large Wound Dressing one or multiple wounds 0 '[]'$  - Application of Medications - topical 0 '[]'$  - Application of Medications - injection 0 INTERVENTIONS - Miscellaneous '[]'$  - External ear exam 0 [name], [name] ([id number]) '[]'$  - Specimen Collection (cultures, biopsies, blood, body fluids, etc.) 0 '[]'$  - Specimen(s) / Culture(s) sent or taken to Lab for analysis 0 '[]'$  - Patient Transfer (multiple staff / Harrel Lemon Lift / Similar devices) 0 '[]'$  - Simple Staple / Suture removal (25 or less) 0 '[]'$  - Complex Staple / Suture removal (26 or more) 0 '[]'$  - Hypo / Hyperglycemic Management (close monitor of Blood Glucose) 0 '[]'$  - Ankle / Brachial Index (ABI) - do not check if billed separately 0 X - Vital Signs 1 5 Has the patient been seen at the hospital within the last three years: Yes Total Score: 75 Level Of Care: New/Established - Level 2 Electronic Signature(s) Signed: 10/03/2014 5:26:34 PM By: Tracy Cool, RN, BSN, Kim RN, BSN Entered By: Tracy Cool, RN, BSN, [name] on 10/03/2014 15:23:41 [name], [name] ([id number]) -------------------------------------------------------------------------------- Encounter Discharge Information Details Patient Name: [name] Date of Service: 10/03/2014 3:00 PM Medical Record Number: [id number] Patient Account Number: [id number] Date of Birth/Sex: Jul 11, 1955 (59 y.o. Female) Treating RN: [name] Primary Care Physician: [name] Other Clinician: Referring Physician: Clayborn [name] Treating Physician/Extender: [name] in  Treatment: 6 Encounter Discharge Information Items Discharge Pain Level: 0 Discharge Condition: Stable Ambulatory Status: Ambulatory Discharge Destination: Home Private Transportation: Auto Accompanied By: self Schedule Follow-up Appointment: Yes Medication Reconciliation completed and Yes provided to Patient/Care Tymia Streb: Clinical Summary of Care: Electronic Signature(s) Signed: 10/03/2014 5:26:34 PM By: Tracy Cool, RN, BSN, Kim RN, BSN Entered By: Tracy Cool, RN, BSN, [name] on 10/03/2014 15:25:28 [name], [name] ([id number]) -------------------------------------------------------------------------------- Multi Wound Chart Details Patient Name: [name] Date of Service: 10/03/2014 3:00 PM Medical Record Number: [id number] Patient Account Number: [id number] Date of Birth/Sex: Dec 27, 1955 (59 y.o. Female) Treating RN: [name] Primary Care Physician: [name] Other Clinician: Referring Physician: Clayborn [name] Treating Physician/Extender: [name] in Treatment: 6 Vital Signs Height(in): 68 Pulse(bpm): 84 Weight(lbs): 183 Blood Pressure 120/74 (mmHg): Body Mass Index(BMI): 28 Temperature(F): 97.4 Respiratory Rate 18 (breaths/min): Photos: [1:No Photos] [N/A:N/A] Wound Location: [1:Right Gluteus - Medial] [N/A:N/A] Wounding Event: [1:Radiation Burn] [N/A:N/A] Primary Etiology: [1:Necrosis (Radiation)] [N/A:N/A] Comorbid History: [1:Chronic Obstructive Pulmonary Disease (COPD), Hypertension, Received Radiation] [N/A:N/A] Date Acquired: [1:05/24/2014] [N/A:N/A] Weeks of Treatment: [1:6] [N/A:N/A] Wound Status: [1:Open] [N/A:N/A] Measurements L x W x D 2.5x1.4x0.3 [N/A:N/A] (cm) Area (cm) : [1:2.749] [N/A:N/A] Volume (cm) : [1:0.825] [N/A:N/A] % Reduction in Area: [1:52.70%] [N/A:N/A] % Reduction in Volume: 29.00% [N/A:N/A] Classification: [1:Full Thickness Without Exposed Support Structures] [N/A:N/A] Exudate Amount: [1:Medium] [N/A:N/A] Exudate  Type: [1:Serosanguineous] [N/A:N/A] Exudate Color: [1:red, brown] [N/A:N/A] Foul Odor After [1:Yes] [N/A:N/A] Cleansing: Odor Anticipated Due to No [N/A:N/A] Product Use: Wound Margin: [1:Distinct, outline attached] [N/A:N/A] Granulation Amount: [1:Large (67-100%)] [N/A:N/A] Granulation Quality: [1:Red] [N/A:N/A] Necrotic Amount: None Present (0%) N/A N/A Exposed Structures: Fascia: No N/A N/A Fat: No Tendon: No Muscle: No Joint: No Bone: No Limited to Skin Breakdown Epithelialization: None N/A N/A Periwound Skin Texture: Edema: No N/A N/A Excoriation: No  Induration: No Callus: No Crepitus: No Fluctuance: No Friable: No Rash: No Scarring: No Periwound Skin Moist: Yes N/A N/A Moisture: Maceration: No Dry/Scaly: No Periwound Skin Color: Atrophie Blanche: No N/A N/A Cyanosis: No Ecchymosis: No Erythema: No Hemosiderin Staining: No Mottled: No Pallor: No Rubor: No Temperature: No Abnormality N/A N/A Tenderness on Yes N/A N/A Palpation: Wound Preparation: Ulcer Cleansing: N/A N/A Rinsed/Irrigated with Saline Topical Anesthetic Applied: None Treatment Notes Electronic Signature(s) Signed: 10/03/2014 5:26:34 PM By: Tracy Cool, RN, BSN, Kim RN, BSN Entered By: Tracy Cool, RN, BSN, [name] on 10/03/2014 15:22:33 [name], [name] ([id number]) -------------------------------------------------------------------------------- [place] Details Patient Name: [name]. Date of Service: 10/03/2014 3:00 PM Medical Record Number: [id number] Patient Account Number: [id number] Date of Birth/Sex: 31-May-1955 (59 y.o. Female) Treating RN: [name] Primary Care Physician: [name] Other Clinician: Referring Physician: Clayborn [name] Treating Physician/Extender: [name] in Treatment: 6 Active Inactive Abuse / Safety / Falls / Self Care Management Nursing Diagnoses: Impaired physical mobility Potential for falls Self care deficit: actual or  potential Goals: Patient will remain injury free Date Initiated: 08/17/2014 Goal Status: Active Patient/caregiver will verbalize understanding of skin care regimen Date Initiated: 08/17/2014 Goal Status: Active Patient/caregiver will verbalize/demonstrate measure taken to improve self care Date Initiated: 08/17/2014 Goal Status: Active Patient/caregiver will verbalize/demonstrate measures taken to improve the patient's personal safety Date Initiated: 08/17/2014 Goal Status: Active Patient/caregiver will verbalize/demonstrate measures taken to prevent injury and/or falls Date Initiated: 08/17/2014 Goal Status: Active Patient/caregiver will verbalize/demonstrate understanding of what to do in case of emergency Date Initiated: 08/17/2014 Goal Status: Active Interventions: Assess fall risk on admission and as needed Assess: immobility, friction, shearing, incontinence upon admission and as needed Assess impairment of mobility on admission and as needed per policy Assess self care needs on admission and as needed Provide education on basic hygiene Provide education on fall prevention Provide education on personal and home safety CLOMA, RAHRIG ([id number]) Provide education on safe transfers Treatment Activities: Education provided on Basic Hygiene : 08/17/2014 Notes: Orientation to the Wound Care Program Nursing Diagnoses: Knowledge deficit related to the wound healing center program Goals: Patient/caregiver will verbalize understanding of the [facility] Date Initiated: 08/17/2014 Goal Status: Active Interventions: Provide education on orientation to the wound center Notes: Wound/Skin Impairment Nursing Diagnoses: Impaired tissue integrity Knowledge deficit related to smoking impact on wound healing Knowledge deficit related to ulceration/compromised skin integrity Goals: Patient/caregiver will verbalize understanding of skin care regimen Date Initiated:  08/17/2014 Goal Status: Active Ulcer/skin breakdown will have a volume reduction of 30% by week 4 Date Initiated: 08/17/2014 Goal Status: Active Ulcer/skin breakdown will have a volume reduction of 50% by week 8 Date Initiated: 08/17/2014 Goal Status: Active Ulcer/skin breakdown will have a volume reduction of 80% by week 12 Date Initiated: 08/17/2014 Goal Status: Active Ulcer/skin breakdown will heal within 14 weeks Date Initiated: 08/17/2014 Goal Status: Active Interventions: JANNA, OAK ([id number]) Assess patient/caregiver ability to obtain necessary supplies Assess patient/caregiver ability to perform ulcer/skin care regimen upon admission and as needed Assess ulceration(s) every visit Provide education on smoking Provide education on ulcer and skin care Notes: Electronic Signature(s) Signed: 10/03/2014 5:26:34 PM By: Tracy Cool, RN, BSN, Kim RN, BSN Entered By: Tracy Cool, RN, BSN, [name] on 10/03/2014 15:22:20 KENZLI, BARRITT ([id number]) -------------------------------------------------------------------------------- Pain Assessment Details Patient Name: [name] Date of Service: 10/03/2014 3:00 PM Medical Record Number: [id number] Patient Account Number: [id number] Date of Birth/Sex: [birth year] (59 y.o. Female) Treating RN: [name] Primary Care  Physician: [name] Other Clinician: Referring Physician: Clayborn [name] Treating Physician/Extender: [name] in Treatment: 6 Active Problems Location of Pain Severity and Description of Pain Patient Has Paino No Site Locations Pain Management and Medication Current Pain Management: Electronic Signature(s) Signed: 10/03/2014 5:26:34 PM By: Tracy Cool, RN, BSN, Kim RN, BSN Entered By: Tracy Cool, RN, BSN, [name] on 10/03/2014 15:16:42 [name] ([id number]) -------------------------------------------------------------------------------- Patient/Caregiver Education Details Patient Name: [name]. Date of Service: 10/03/2014 3:00 PM Medical Record Number: [id number] Patient Account Number: [id number] Date of Birth/Gender: 01-03-1956 (59 y.o. Female) Treating RN: [name] Primary Care Physician: [name] Other Clinician: Referring Physician: Clayborn [name] Treating Physician/Extender: [name] in Treatment: 6 Education Assessment Education Provided To: Patient Education Topics Provided Wound/Skin Impairment: Handouts: Caring for Your Ulcer, Other: continue wound care as prescrbed Electronic Signature(s) Signed: 10/03/2014 5:26:34 PM By: Tracy Cool, RN, BSN, Kim RN, BSN Entered By: Tracy Cool, RN, BSN, [name] on 10/03/2014 15:25:50 [name] ([id number]) -------------------------------------------------------------------------------- Wound Assessment Details Patient Name: [name], [name] Date of Service: 10/03/2014 3:00 PM Medical Record Number: [id number] Patient Account Number: [id number] Date of Birth/Sex: 12-Feb-1955 (59 y.o. Female) Treating RN: [name] Primary Care Physician: [name] Other Clinician: Referring Physician: Clayborn [name] Treating Physician/Extender: [name] in Treatment: 6 Wound Status Wound Number: 1 Primary Necrosis (Radiation) Etiology: Wound Location: Right Gluteus - Medial Wound Open Wounding Event: Radiation Burn Status: Date Acquired: 05/24/2014 Comorbid Chronic Obstructive Pulmonary Weeks Of Treatment: 6 History: Disease (COPD), Hypertension, Clustered Wound: No Received Radiation Photos Photo Uploaded By: Tracy Cool, RN, BSN, [name] on 10/03/2014 17:04:12 Wound Measurements Length: (cm) 2.5 Width: (cm) 1.4 Depth: (cm) 0.3 Area: (cm) 2.749 Volume: (cm) 0.825 % Reduction in Area: 52.7% % Reduction in Volume: 29% Epithelialization: None Tunneling: No Undermining: No Wound Description Full Thickness Without Exposed Classification: Support Structures Wound Margin: Distinct, outline  attached Exudate Medium Amount: Exudate Type: Serosanguineous Exudate Color: red, brown Foul Odor After Cleansing: Yes Due to Product Use: No Wound Bed Granulation Amount: Large (67-100%) Exposed Structure Granulation Quality: Red Fascia Exposed: No [name], [name] ([id number]) Necrotic Amount: None Present (0%) Fat Layer Exposed: No Tendon Exposed: No Muscle Exposed: No Joint Exposed: No Bone Exposed: No Limited to Skin Breakdown Periwound Skin Texture Texture Color No Abnormalities Noted: No No Abnormalities Noted: No Callus: No Atrophie Blanche: No Crepitus: No Cyanosis: No Excoriation: No Ecchymosis: No Fluctuance: No Erythema: No Friable: No Hemosiderin Staining: No Induration: No Mottled: No Localized Edema: No Pallor: No Rash: No Rubor: No Scarring: No Temperature / Pain Moisture Temperature: No Abnormality No Abnormalities Noted: No Tenderness on Palpation: Yes Dry / Scaly: No Maceration: No Moist: Yes Wound Preparation Ulcer Cleansing: Rinsed/Irrigated with Saline Topical Anesthetic Applied: None Treatment Notes Wound #1 (Right, Medial Gluteus) 1. Cleansed with: Clean wound with Normal Saline Notes Patient to use pad and cream to keep moist area moist Electronic Signature(s) Signed: 10/03/2014 5:26:34 PM By: Tracy Cool, RN, BSN, Kim RN, BSN Entered By: Tracy Cool, RN, BSN, [name] on 10/03/2014 15:22:01 [name], RAETHER ([id number]) -------------------------------------------------------------------------------- [place] Details Patient Name: [name] Date of Service: 10/03/2014 3:00 PM Medical Record Number: [id number] Patient Account Number: [id number] Date of Birth/Sex: 02-19-1955 (59 y.o. Female) Treating RN: Montey Hora Primary Care Physician: [name] Other Clinician: Referring Physician: Clayborn [name] Treating Physician/Extender: [name] in Treatment: 6 Vital Signs Time Taken: 14:40 Temperature (F): 97.4 Height  (in): 68 Pulse (bpm): 84 Weight (lbs): 183 Respiratory Rate (breaths/min): 18 Body Mass Index (BMI):  27.8 Blood Pressure (mmHg): 120/74 Reference Range: 80 - 120 mg / dl Electronic Signature(s) Signed: 10/03/2014 3:00:55 PM By: Lorine Bears RCP, RRT, CHT Entered By: Lorine Bears on 10/03/2014 14:39:43

## 2014-10-05 ENCOUNTER — Encounter: Payer: Commercial Managed Care - HMO | Admitting: Surgery

## 2014-10-05 ENCOUNTER — Inpatient Hospital Stay: Payer: Commercial Managed Care - HMO

## 2014-10-05 ENCOUNTER — Inpatient Hospital Stay: Payer: Commercial Managed Care - HMO | Admitting: Hematology and Oncology

## 2014-10-05 ENCOUNTER — Ambulatory Visit: Payer: Commercial Managed Care - HMO

## 2014-10-05 DIAGNOSIS — L98412 Non-pressure chronic ulcer of buttock with fat layer exposed: Secondary | ICD-10-CM | POA: Diagnosis not present

## 2014-10-05 NOTE — Progress Notes (Signed)
ANGELIC, SCHNELLE ([id number]) Visit Report for 10/05/2014 Arrival Information Details Patient Name: Tracy, [name]. Date of Service: 10/05/2014 8:00 AM Medical Record Number: [id number] Patient Account Number: [id number] Date of Birth/Sex: March 05, 1955 (59 y.o. Female) Treating RN: Primary Care Physician: Clayborn Bigness Other Clinician: Jacqulyn Bath Referring Physician: Clayborn Bigness Treating Physician/Extender: Frann Rider in Treatment: 7 Visit Information History Since Last Visit Added or deleted any medications: No Patient Arrived: Ambulatory Any new allergies or adverse reactions: No Arrival Time: 07:55 Had a fall or experienced change in No Accompanied By: cancer center Lucianne Lei activities of daily living that may affect driver risk of falls: Transfer Assistance: None Signs or symptoms of abuse/neglect since last No Patient Requires Transmission- No visito Based Precautions: Hospitalized since last visit: No Patient Has Alerts: No Has Dressing in Place as Prescribed: Yes Pain Present Now: No Electronic Signature(s) Signed: 10/05/2014 1:15:01 PM By: Lorine Bears RCP, RRT, CHT Entered By: Lorine Bears on 10/05/2014 08:22:46 [name] ([id number]) -------------------------------------------------------------------------------- Encounter Discharge Information Details Patient Name: [name]. Date of Service: 10/05/2014 8:00 AM Medical Record Number: [id number] Patient Account Number: [id number] Date of Birth/Sex: 25-Nov-1955 (59 y.o. Female) Treating RN: Primary Care Physician: Clayborn Bigness Other Clinician: Jacqulyn Bath Referring Physician: Clayborn Bigness Treating Physician/Extender: Frann Rider in Treatment: 7 Encounter Discharge Information Items Discharge Pain Level: 0 Discharge Condition: Stable Ambulatory Status: Ambulatory Discharge Destination: Home Transportation: Other cancer center Accompanied  By: [name] Schedule Follow-up Appointment: No Medication Reconciliation completed and provided to Patient/Care No Danyale Ridinger: Clinical Summary of Care: Notes Patient has an HBO treatment scheduled on 10/06/14 at 08:00 am. Electronic Signature(s) Signed: 10/05/2014 1:15:01 PM By: Lorine Bears RCP, RRT, CHT Entered By: Lorine Bears on 10/05/2014 10:38:55 Speranza, [name] ([id number]) -------------------------------------------------------------------------------- Vitals Details Patient Name: [name]. Date of Service: 10/05/2014 8:00 AM Medical Record Number: [id number] Patient Account Number: [id number] Date of Birth/Sex: 27-Jun-1955 (59 y.o. Female) Treating RN: Primary Care Physician: Clayborn Bigness Other Clinician: Jacqulyn Bath Referring Physician: Clayborn Bigness Treating Physician/Extender: Frann Rider in Treatment: 7 Vital Signs Time Taken: 07:55 Temperature (F): 98.2 Height (in): 68 Pulse (bpm): 84 Weight (lbs): 183 Respiratory Rate (breaths/min): 18 Body Mass Index (BMI): 27.8 Blood Pressure (mmHg): 112/82 Reference Range: 80 - 120 mg / dl Electronic Signature(s) Signed: 10/05/2014 1:15:01 PM By: Lorine Bears RCP, RRT, CHT Entered By: Lorine Bears on 10/05/2014 08:23:11

## 2014-10-05 NOTE — Progress Notes (Signed)
[name], [name] ([id number]) Visit Report for 10/05/2014 HBO Details Patient Name: [name], [name]. Date of Service: 10/05/2014 8:00 AM Medical Record Number: [id number] Patient Account Number: [id number] Date of Birth/Sex: [birth year] (59 y.o. Female) Treating RN: Primary Care Physician: Clayborn Bigness Other Clinician: Jacqulyn Bath Referring Physician: Clayborn Bigness Treating Physician/Extender: Frann Rider in Treatment: 7 HBO Treatment Course Details Treatment Course Ordering Physician: Christin Fudge 1 Number: HBO Treatment Start Date: 09/13/2014 Total Treatments 40 Ordered: HBO Indication: Late Effect of Radiation HBO Treatment Details Treatment Number: 17 Patient Type: Outpatient Chamber Type: Monoplace Chamber #: [id number] Treatment Protocol: 2.0 ATA with 90 minutes oxygen, and no air breaks Treatment Details Compression Rate Down: 1.5 psi / minute De-Compression Rate Up: 1.5 psi / minute Air breaks and breathing Compress Tx Pressure Decompress Decompress periods Begins Reached Begins Ends (leave unused spaces blank) Chamber Pressure 1 ATA 2.0 ATA - - - - - - 2.0 ATA 1 ATA Clock Time (24 hr) 08:13 08:25 - - - - - - 09:55 10:07 Treatment Length: 114 (minutes) Treatment Segments: 4 Capillary Blood Glucose Pre Capillary Blood Glucose (mg/dl): Post Capillary Blood Glucose (mg/dl): Vital Signs Capillary Blood Glucose Reference Range: 80 - 120 mg / dl HBO Diabetic Blood Glucose Intervention Range: <131 mg/dl or >249 mg/dl Time Vitals Blood Respiratory Capillary Blood Glucose Pulse Action Type: Pulse: Temperature: Taken: Pressure: Rate: Glucose (mg/dl): Meter #: Oximetry (%) Taken: Pre 07:55 112/82 84 18 98.2 Post 10:08 132/84 72 18 98.3 Treatment Response Treatment Completion Status: Treatment Completed without Adverse Event [name], [name]. ([id number]) HBO Attestation I certify that I supervised this HBO treatment in accordance with Medicare  guidelines. A trained Yes emergency response team is readily available per hospital policies and procedures. Continue HBOT as ordered. Yes Electronic Signature(s) Signed: 10/05/2014 10:58:42 AM By: Christin Fudge MD, FACS Entered By: Christin Fudge on 10/05/2014 10:58:42 [name], [name] ([id number]) -------------------------------------------------------------------------------- HBO Safety Checklist Details Patient Name: Tracy Picket P. Date of Service: 10/05/2014 8:00 AM Medical Record Number: [id number] Patient Account Number: [id number] Date of Birth/Sex: [birth year] (59 y.o. Female) Treating RN: Primary Care Physician: Clayborn Bigness Other Clinician: Jacqulyn Bath Referring Physician: Clayborn Bigness Treating Physician/Extender: Frann Rider in Treatment: 7 HBO Safety Checklist Items Safety Checklist Consent Form Signed Patient voided / foley secured and emptied When did you last eato 21:00 pm on 10/04/14 Last dose of injectable or oral agent n/a Ostomy pouch emptied and vented if applicable NA All implantable devices assessed, documented and approved NA Intravenous access site secured and place Valuables secured Linens and cotton and cotton/polyester blend (less than 51% polyester) Personal oil-based products / skin lotions / body lotions removed Wigs or hairpieces removed Smoking or tobacco materials removed Books / newspapers / magazines / loose paper removed Cologne, aftershave, perfume and deodorant removed Jewelry removed (may wrap wedding band) Make-up removed Hair care products removed Battery operated devices (external) removed Heating patches and chemical warmers removed NA Titanium eyewear removed NA Nail polish cured greater than 10 hours NA Casting material cured greater than 10 hours NA Hearing aids removed Loose dentures or partials removed NA Prosthetics have been removed Patient demonstrates correct use of air break device (if  applicable) Patient concerns have been addressed Patient grounding bracelet on and cord attached to chamber Specifics for Inpatients (complete in addition to above) Medication sheet sent with patient Intravenous medications needed or due during therapy sent with patient [name], [name]. ([id number]) Drainage tubes (e.g. nasogastric tube or chest tube secured and  vented) Endotracheal or Tracheotomy tube secured Cuff deflated of air and inflated with saline Airway suctioned Electronic Signature(s) Signed: 10/05/2014 1:15:01 PM By: Lorine Bears RCP, RRT, CHT Entered By: Lorine Bears on 10/05/2014 08:24:13

## 2014-10-05 NOTE — Progress Notes (Signed)
[name], [name] ([id number]) Visit Report for 10/04/2014 Arrival Information Details [name], [name] Date of Service: 10/04/2014 1:00 PM Patient Name: P. Patient Account Number: [id number] Medical Record Treating RN: [id number] Number: Other Clinician: Jacqulyn Bath Date of Birth/Sex: [birth year] (59 y.o. Female) Treating BURNS III, Primary Care Physician: Clayborn Bigness Physician/Extender: Thayer Jew Referring Physician: Thomasenia Bottoms in Treatment: 6 Visit Information History Since Last Visit Added or deleted any medications: No Patient Arrived: Ambulatory Any new allergies or adverse reactions: No Arrival Time: 12:45 Had a fall or experienced change in No Accompanied By: cancer center activities of daily living that may affect van risk of falls: Transfer Assistance: None Signs or symptoms of abuse/neglect since last No Patient Requires Transmission- No visito Based Precautions: Hospitalized since last visit: No Patient Has Alerts: No Has Dressing in Place as Prescribed: Yes Pain Present Now: No Electronic Signature(s) Signed: 10/04/2014 4:07:53 PM By: Lorine Bears RCP, RRT, CHT Entered By: Lorine Bears on 10/04/2014 13:06:09 [name] ([id number]) -------------------------------------------------------------------------------- Encounter Discharge Information Details [name] Date of Service: 10/04/2014 1:00 PM Patient Name: P. Patient Account Number: [id number] Medical Record Treating RN: [id number] Number: Other Clinician: Jacqulyn Bath Date of Birth/Sex: 07/13/1955 (59 y.o. Female) Treating BURNS III, Primary Care Physician: Clayborn Bigness Physician/Extender: Thayer Jew Referring Physician: Thomasenia Bottoms in Treatment: 6 Encounter Discharge Information Items Discharge Pain Level: 0 Discharge Condition: Stable Ambulatory Status: Ambulatory Discharge Destination: Home Transportation: Other cancer center Accompanied  By: [name] Schedule Follow-up Appointment: No Medication Reconciliation completed and provided to Patient/Care No Provider: Clinical Summary of Care: Notes Patient has an HBO treatment scheduled on 10/05/14 at 08:00 am. Electronic Signature(s) Signed: 10/04/2014 4:07:53 PM By: Lorine Bears RCP, RRT, CHT Entered By: Lorine Bears on 10/04/2014 15:09:48 [name] ([id number]) -------------------------------------------------------------------------------- Vitals Details [name] Date of Service: 10/04/2014 1:00 PM Patient Name: P. Patient Account Number: [id number] Medical Record Treating RN: [id number] Number: Other Clinician: Jacqulyn Bath Date of Birth/Sex: [birth year] (59 y.o. Female) Treating BURNS III, Primary Care Physician: Clayborn Bigness Physician/Extender: Thayer Jew Referring Physician: Clayborn Bigness Weeks in Treatment: 6 Vital Signs Time Taken: 12:45 Temperature (F): 97.6 Height (in): 68 Pulse (bpm): 72 Weight (lbs): 183 Respiratory Rate (breaths/min): 18 Body Mass Index (BMI): 27.8 Blood Pressure (mmHg): 132/96 Reference Range: 80 - 120 mg / dl Electronic Signature(s) Signed: 10/04/2014 4:07:53 PM By: Lorine Bears RCP, RRT, CHT Entered By: Lorine Bears on 10/04/2014 13:06:43

## 2014-10-05 NOTE — Telephone Encounter (Signed)
Patient called back at 1607 on 10/03/14 and states that she could not hear me well on the phone and that she would call back. Did not hear back from patient.  Will be glad to speak with her if she calls back.

## 2014-10-05 NOTE — Progress Notes (Signed)
[name], [name] ([id number]) Visit Report for 10/04/2014 HBO Details [name] Date of Service: 10/04/2014 1:00 PM Patient Name: P. Patient Account Number: [id number] Medical Record Treating RN: [id number] Number: Other Clinician: Jacqulyn [name] Date of Birth/Sex: 1955/07/03 (59 y.o. Female) Treating [name], Primary Care [name]: [name] [name]/Extender: [name]: [name] in Treatment: 6 HBO Treatment Course Details Treatment Course Ordering [name]: [name] 1 Number: HBO Treatment Start Date: 09/13/2014 Total Treatments 40 Ordered: HBO Indication: Late Effect of Radiation HBO Treatment Details Treatment Number: 16 Patient Type: Outpatient Chamber Type: Monoplace Chamber #: [id number] Treatment Protocol: 2.0 ATA with 90 minutes oxygen, and no air breaks Treatment Details Compression Rate Down: 1.5 psi / minute De-Compression Rate Up: 1.5 psi / minute Air breaks and breathing Compress Tx Pressure Decompress Decompress periods Begins Reached Begins Ends (leave unused spaces blank) Chamber Pressure 1 ATA 2.0 ATA - - - - - - 2.0 ATA 1 ATA Clock Time (24 hr) 13:02 13:12 - - - - - - 14:42 14:53 Treatment Length: 111 (minutes) Treatment Segments: 4 Capillary Blood Glucose Pre Capillary Blood Glucose (mg/dl): Post Capillary Blood Glucose (mg/dl): Vital Signs Capillary Blood Glucose Reference Range: 80 - 120 mg / dl HBO Diabetic Blood Glucose Intervention Range: <131 mg/dl or >249 mg/dl Time Vitals Blood Respiratory Capillary Blood Glucose Pulse Action Type: Pulse: Temperature: Taken: Pressure: Rate: Glucose (mg/dl): Meter #: Oximetry (%) Taken: Pre 12:45 132/96 72 18 97.6 Post 14:55 112/64 72 18 97.5 Treatment Response [name], [name] ([id number]) Treatment Completion Status: Treatment Completed without Adverse Event HBO Attestation I certify that I supervised this HBO treatment in accordance with  Medicare guidelines. A trained Yes emergency response team is readily available per hospital policies and procedures. Continue HBOT as ordered. Yes Electronic Signature(s) Signed: 10/04/2014 3:24:31 PM By: Tracy Grayer MD Entered By: [name] on 10/04/2014 15:10:31 [name] ([id number]) -------------------------------------------------------------------------------- HBO Safety Checklist Details [name] Date of Service: 10/04/2014 1:00 PM Patient Name: P. Patient Account Number: [id number] Medical Record Treating RN: [id number] Number: Other Clinician: Jacqulyn [name] Date of Birth/Sex: Dec 11, 1955 (59 y.o. Female) Treating [name], Primary Care [name]: [name] [name]/Extender: [name]: [name] Weeks in Treatment: 6 HBO Safety Checklist Items Safety Checklist Consent Form Signed Patient voided / foley secured and emptied When did you last eato 91:45 pm Last dose of injectable or oral agent n/a Ostomy pouch emptied and vented if applicable NA All implantable devices assessed, documented and approved NA Intravenous access site secured and place Valuables secured Linens and cotton and cotton/polyester blend (less than 51% polyester) Personal oil-based products / skin lotions / body lotions removed Wigs or hairpieces removed Smoking or tobacco materials removed Books / newspapers / magazines / loose paper removed Cologne, aftershave, perfume and deodorant removed Jewelry removed (may wrap wedding band) Make-up removed Hair care products removed Battery operated devices (external) removed Heating patches and chemical warmers removed NA Titanium eyewear removed NA Nail polish cured greater than 10 hours NA Casting material cured greater than 10 hours NA Hearing aids removed Loose dentures or partials removed NA Prosthetics have been removed Patient demonstrates correct use of air break device (if  applicable) Patient concerns have been addressed Patient grounding bracelet on and cord attached to chamber Specifics for Inpatients (complete in addition to above) Medication sheet sent with patient [name], CLOS. ([id number]) Intravenous medications needed or due during therapy sent with patient Drainage tubes (e.g. nasogastric tube or chest tube secured  and vented) Endotracheal or Tracheotomy tube secured Cuff deflated of air and inflated with saline Airway suctioned Electronic Signature(s) Signed: 10/04/2014 4:07:53 PM By: [name] RCP, RRT, [name] Entered By: [name] on 10/04/2014 13:07:33

## 2014-10-06 ENCOUNTER — Inpatient Hospital Stay: Payer: Commercial Managed Care - HMO

## 2014-10-06 ENCOUNTER — Telehealth: Payer: Self-pay | Admitting: *Deleted

## 2014-10-06 ENCOUNTER — Inpatient Hospital Stay: Payer: Commercial Managed Care - HMO | Attending: Hematology and Oncology

## 2014-10-06 ENCOUNTER — Ambulatory Visit: Payer: Commercial Managed Care - HMO

## 2014-10-06 ENCOUNTER — Encounter: Payer: Commercial Managed Care - HMO | Admitting: Surgery

## 2014-10-06 DIAGNOSIS — L98412 Non-pressure chronic ulcer of buttock with fat layer exposed: Secondary | ICD-10-CM | POA: Diagnosis not present

## 2014-10-06 DIAGNOSIS — C20 Malignant neoplasm of rectum: Secondary | ICD-10-CM

## 2014-10-06 MED ORDER — OXYCODONE-ACETAMINOPHEN 10-325 MG PO TABS: 1.0000 | ORAL_TABLET | Freq: Four times a day (QID) | ORAL | Status: DC | PRN

## 2014-10-06 NOTE — Progress Notes (Addendum)
KASHANA, BREACH ([id number]) Visit Report for 10/06/2014 HBO Details Patient Name: [name], [name]. Date of Service: 10/06/2014 8:00 AM Medical Record Number: [id number] Patient Account Number: [id number] Date of Birth/Sex: 01/28/1955 (59 y.o. Female) Treating RN: Cornell Barman Primary Care Physician: Clayborn Bigness Other Clinician: Jacqulyn Bath Referring Physician: Clayborn Bigness Treating Physician/Extender: Frann Rider in Treatment: 7 HBO Treatment Course Details Treatment Course Ordering Physician: Christin Fudge 1 Number: HBO Treatment Start Date: 09/13/2014 Total Treatments 40 Ordered: HBO Indication: Late Effect of Radiation HBO Treatment Details Treatment Number: 18 Patient Type: Outpatient Chamber Type: Monoplace Chamber #: [id number] Treatment Protocol: 2.0 ATA with 90 minutes oxygen, and no air breaks Treatment Details Compression Rate Down: 1.5 psi / minute De-Compression Rate Up: 1.5 psi / minute Air breaks and breathing Compress Tx Pressure Decompress Decompress periods Begins Reached Begins Ends (leave unused spaces blank) Chamber Pressure 1 ATA 2.0 ATA - - - - - - 2.0 ATA 1 ATA Clock Time (24 hr) 08:02 08:15 - - - - - - 09:43 09:54 Treatment Length: 112 (minutes) Treatment Segments: 4 Capillary Blood Glucose Pre Capillary Blood Glucose (mg/dl): Post Capillary Blood Glucose (mg/dl): Vital Signs Capillary Blood Glucose Reference Range: 80 - 120 mg / dl HBO Diabetic Blood Glucose Intervention Range: <131 mg/dl or >249 mg/dl Time Vitals Blood Respiratory Capillary Blood Glucose Pulse Action Type: Pulse: Temperature: Taken: Pressure: Rate: Glucose (mg/dl): Meter #: Oximetry (%) Taken: Pre 08:00 142/80 80 18 98 Post 10:00 128/78 88 18 97.7 Treatment Response Treatment Well Toleration: [name], [name]. ([id number]) Treatment Treatment Completed without Adverse Event Completion Status: HBO Attestation I certify that I supervised this HBO  treatment in accordance with Medicare guidelines. A trained Yes emergency response team is readily available per hospital policies and procedures. Continue HBOT as ordered. Yes Electronic Signature(s) Signed: 10/06/2014 12:19:56 PM By: Gretta Cool RN, BSN, Kim RN, BSN Signed: 10/06/2014 4:17:16 PM By: Christin Fudge MD, FACS Previous Signature: 10/06/2014 11:06:54 AM Version By: Christin Fudge MD, FACS Previous Signature: 10/06/2014 10:05:21 AM Version By: Gretta Cool RN, BSN, Kim RN, BSN Previous Signature: 10/06/2014 8:23:02 AM Version By: Gretta Cool RN, BSN, Kim RN, BSN Entered By: Gretta Cool, RN, BSN, Kim on 10/06/2014 12:19:56 [name], [name] ([id number]) -------------------------------------------------------------------------------- HBO Safety Checklist Details Patient Name: [name], [name] Date of Service: 10/06/2014 8:00 AM Medical Record Number: [id number] Patient Account Number: [id number] Date of Birth/Sex: December 10, 1955 (59 y.o. Female) Treating RN: Cornell Barman Primary Care Physician: Clayborn Bigness Other Clinician: Jacqulyn Bath Referring Physician: Clayborn Bigness Treating Physician/Extender: Frann Rider in Treatment: 7 HBO Safety Checklist Items Safety Checklist Consent Form Signed Patient voided / foley secured and emptied When did you last eato 8:45 pm Last dose of injectable or oral agent NA Ostomy pouch emptied and vented if applicable NA All implantable devices assessed, documented and approved NA Intravenous access site secured and place Valuables secured Linens and cotton and cotton/polyester blend (less than 51% polyester) Personal oil-based products / skin lotions / body lotions removed NA Wigs or hairpieces removed Smoking or tobacco materials removed Books / newspapers / magazines / loose paper removed Cologne, aftershave, perfume and deodorant removed Jewelry removed (may wrap wedding band) Make-up removed Hair care products removed Battery operated devices  (external) removed NA Heating patches and chemical warmers removed Titanium eyewear removed Nail polish cured greater than 10 hours NA Casting material cured greater than 10 hours NA Hearing aids removed Loose dentures or partials removed NA Prosthetics have been removed Patient demonstrates correct use of air  break device (if applicable) Patient concerns have been addressed Patient grounding bracelet on and cord attached to chamber Specifics for Inpatients (complete in addition to above) Medication sheet sent with patient Intravenous medications needed or due during therapy sent with patient [name], [name]. ([id number]) Drainage tubes (e.g. nasogastric tube or chest tube secured and vented) Endotracheal or Tracheotomy tube secured Cuff deflated of air and inflated with saline Airway suctioned Electronic Signature(s) Signed: 10/06/2014 8:22:32 AM By: Gretta Cool, RN, BSN, Kim RN, BSN Entered By: Gretta Cool, RN, BSN, Kim on 10/06/2014 94:71:25

## 2014-10-06 NOTE — Telephone Encounter (Signed)
Informed that prescription is ready to pick up  

## 2014-10-06 NOTE — Progress Notes (Signed)
ROCHELE, LUECK ([id number]) Visit Report for 10/06/2014 Arrival Information Details Patient Name: [name], [name]. Date of Service: 10/06/2014 8:00 AM Medical Record Number: [id number] Patient Account Number: [id number] Date of Birth/Sex: [birth year] (59 y.o. Female) Treating RN: Cornell Barman Primary Care Physician: Clayborn Bigness Other Clinician: Jacqulyn Bath Referring Physician: Clayborn Bigness Treating Physician/Extender: Frann Rider in Treatment: 7 Visit Information History Since Last Visit Added or deleted any medications: No Patient Arrived: Ambulatory Any new allergies or adverse reactions: No Arrival Time: 07:55 Had a fall or experienced change in No Accompanied By: self activities of daily living that may affect Transfer Assistance: None risk of falls: Patient Identification Verified: Yes Signs or symptoms of abuse/neglect since last No Secondary Verification Process Yes visito Completed: Hospitalized since last visit: No Patient Requires Transmission-Based No Has Dressing in Place as Prescribed: Yes Precautions: Pain Present Now: No Patient Has Alerts: No Electronic Signature(s) Signed: 10/06/2014 8:20:51 AM By: Gretta Cool, RN, BSN, Kim RN, BSN Entered By: Gretta Cool, RN, BSN, Kim on 10/06/2014 08:20:51 [name] ([id number]) -------------------------------------------------------------------------------- Encounter Discharge Information Details Patient Name: Tracy Picket P. Date of Service: 10/06/2014 8:00 AM Medical Record Number: [id number] Patient Account Number: [id number] Date of Birth/Sex: 06-Feb-1955 (59 y.o. Female) Treating RN: Cornell Barman Primary Care Physician: Clayborn Bigness Other Clinician: Jacqulyn Bath Referring Physician: Clayborn Bigness Treating Physician/Extender: Frann Rider in Treatment: 7 Encounter Discharge Information Items Discharge Pain Level: 0 Discharge Condition: Stable Ambulatory Status: Ambulatory Discharge  Destination: Home Private Transportation: Auto Accompanied By: self Schedule Follow-up Appointment: Yes Medication Reconciliation completed and Yes provided to Patient/Care Provider: Clinical Summary of Care: Electronic Signature(s) Signed: 10/06/2014 11:17:38 AM By: Gretta Cool, RN, BSN, Kim RN, BSN Entered By: Gretta Cool, RN, BSN, Kim on 10/06/2014 11:17:38 [name] ([id number]) -------------------------------------------------------------------------------- Vitals Details Patient Name: [name]. Date of Service: 10/06/2014 8:00 AM Medical Record Number: [id number] Patient Account Number: [id number] Date of Birth/Sex: 1955/09/15 (59 y.o. Female) Treating RN: Cornell Barman Primary Care Physician: Clayborn Bigness Other Clinician: Jacqulyn Bath Referring Physician: Clayborn Bigness Treating Physician/Extender: Frann Rider in Treatment: 7 Vital Signs Time Taken: 08:00 Temperature (F): 98.0 Height (in): 68 Pulse (bpm): 80 Weight (lbs): 183 Respiratory Rate (breaths/min): 18 Body Mass Index (BMI): 27.8 Blood Pressure (mmHg): 142/80 Reference Range: 80 - 120 mg / dl Electronic Signature(s) Signed: 10/06/2014 8:21:28 AM By: Gretta Cool, RN, BSN, Kim RN, BSN Entered By: Gretta Cool, RN, BSN, Kim on 10/06/2014 43:60:67

## 2014-10-09 ENCOUNTER — Encounter: Payer: Commercial Managed Care - HMO | Admitting: Surgery

## 2014-10-09 ENCOUNTER — Inpatient Hospital Stay: Payer: Commercial Managed Care - HMO

## 2014-10-09 ENCOUNTER — Ambulatory Visit: Payer: Commercial Managed Care - HMO

## 2014-10-09 ENCOUNTER — Inpatient Hospital Stay: Payer: Commercial Managed Care - HMO | Attending: Hematology and Oncology

## 2014-10-09 DIAGNOSIS — K219 Gastro-esophageal reflux disease without esophagitis: Secondary | ICD-10-CM | POA: Insufficient documentation

## 2014-10-09 DIAGNOSIS — M4800 Spinal stenosis, site unspecified: Secondary | ICD-10-CM | POA: Insufficient documentation

## 2014-10-09 DIAGNOSIS — Z23 Encounter for immunization: Secondary | ICD-10-CM | POA: Insufficient documentation

## 2014-10-09 DIAGNOSIS — M5136 Other intervertebral disc degeneration, lumbar region: Secondary | ICD-10-CM | POA: Insufficient documentation

## 2014-10-09 DIAGNOSIS — Z7901 Long term (current) use of anticoagulants: Secondary | ICD-10-CM | POA: Insufficient documentation

## 2014-10-09 DIAGNOSIS — G629 Polyneuropathy, unspecified: Secondary | ICD-10-CM | POA: Insufficient documentation

## 2014-10-09 DIAGNOSIS — R5383 Other fatigue: Secondary | ICD-10-CM | POA: Insufficient documentation

## 2014-10-09 DIAGNOSIS — R0602 Shortness of breath: Secondary | ICD-10-CM | POA: Insufficient documentation

## 2014-10-09 DIAGNOSIS — M797 Fibromyalgia: Secondary | ICD-10-CM | POA: Insufficient documentation

## 2014-10-09 DIAGNOSIS — E538 Deficiency of other specified B group vitamins: Secondary | ICD-10-CM | POA: Insufficient documentation

## 2014-10-09 DIAGNOSIS — J45909 Unspecified asthma, uncomplicated: Secondary | ICD-10-CM | POA: Insufficient documentation

## 2014-10-09 DIAGNOSIS — F329 Major depressive disorder, single episode, unspecified: Secondary | ICD-10-CM | POA: Insufficient documentation

## 2014-10-09 DIAGNOSIS — C210 Malignant neoplasm of anus, unspecified: Secondary | ICD-10-CM | POA: Insufficient documentation

## 2014-10-09 DIAGNOSIS — Z809 Family history of malignant neoplasm, unspecified: Secondary | ICD-10-CM | POA: Insufficient documentation

## 2014-10-09 DIAGNOSIS — Z79899 Other long term (current) drug therapy: Secondary | ICD-10-CM | POA: Insufficient documentation

## 2014-10-09 DIAGNOSIS — G4733 Obstructive sleep apnea (adult) (pediatric): Secondary | ICD-10-CM | POA: Insufficient documentation

## 2014-10-09 DIAGNOSIS — Z8701 Personal history of pneumonia (recurrent): Secondary | ICD-10-CM | POA: Insufficient documentation

## 2014-10-09 DIAGNOSIS — I82622 Acute embolism and thrombosis of deep veins of left upper extremity: Secondary | ICD-10-CM | POA: Insufficient documentation

## 2014-10-09 DIAGNOSIS — E041 Nontoxic single thyroid nodule: Secondary | ICD-10-CM | POA: Insufficient documentation

## 2014-10-09 DIAGNOSIS — F419 Anxiety disorder, unspecified: Secondary | ICD-10-CM | POA: Insufficient documentation

## 2014-10-09 DIAGNOSIS — F209 Schizophrenia, unspecified: Secondary | ICD-10-CM | POA: Insufficient documentation

## 2014-10-09 DIAGNOSIS — F4310 Post-traumatic stress disorder, unspecified: Secondary | ICD-10-CM | POA: Insufficient documentation

## 2014-10-09 DIAGNOSIS — Z923 Personal history of irradiation: Secondary | ICD-10-CM | POA: Insufficient documentation

## 2014-10-09 DIAGNOSIS — J449 Chronic obstructive pulmonary disease, unspecified: Secondary | ICD-10-CM | POA: Insufficient documentation

## 2014-10-09 DIAGNOSIS — E039 Hypothyroidism, unspecified: Secondary | ICD-10-CM | POA: Insufficient documentation

## 2014-10-09 DIAGNOSIS — Z9221 Personal history of antineoplastic chemotherapy: Secondary | ICD-10-CM | POA: Insufficient documentation

## 2014-10-09 DIAGNOSIS — Z933 Colostomy status: Secondary | ICD-10-CM | POA: Insufficient documentation

## 2014-10-10 ENCOUNTER — Ambulatory Visit: Payer: Commercial Managed Care - HMO

## 2014-10-10 ENCOUNTER — Inpatient Hospital Stay: Payer: Commercial Managed Care - HMO

## 2014-10-10 ENCOUNTER — Ambulatory Visit: Payer: Commercial Managed Care - HMO | Admitting: Surgery

## 2014-10-10 ENCOUNTER — Encounter: Payer: Commercial Managed Care - HMO | Admitting: Surgery

## 2014-10-11 ENCOUNTER — Inpatient Hospital Stay: Payer: Commercial Managed Care - HMO

## 2014-10-11 ENCOUNTER — Ambulatory Visit: Payer: Commercial Managed Care - HMO

## 2014-10-11 ENCOUNTER — Encounter: Payer: Commercial Managed Care - HMO | Admitting: Surgery

## 2014-10-12 ENCOUNTER — Encounter: Payer: Self-pay | Admitting: General Surgery

## 2014-10-12 ENCOUNTER — Other Ambulatory Visit: Payer: Self-pay | Admitting: Hematology and Oncology

## 2014-10-12 ENCOUNTER — Encounter: Payer: Commercial Managed Care - HMO | Admitting: General Surgery

## 2014-10-12 ENCOUNTER — Inpatient Hospital Stay: Payer: Commercial Managed Care - HMO

## 2014-10-12 ENCOUNTER — Ambulatory Visit: Payer: Commercial Managed Care - HMO

## 2014-10-12 ENCOUNTER — Ambulatory Visit: Payer: Commercial Managed Care - HMO | Admitting: General Surgery

## 2014-10-13 ENCOUNTER — Ambulatory Visit: Payer: Commercial Managed Care - HMO

## 2014-10-13 ENCOUNTER — Inpatient Hospital Stay: Payer: Commercial Managed Care - HMO

## 2014-10-13 ENCOUNTER — Encounter: Payer: Commercial Managed Care - HMO | Admitting: Surgery

## 2014-10-13 ENCOUNTER — Other Ambulatory Visit: Payer: Commercial Managed Care - HMO

## 2014-10-14 ENCOUNTER — Encounter: Payer: Self-pay | Admitting: Internal Medicine

## 2014-10-14 ENCOUNTER — Emergency Department: Payer: Commercial Managed Care - HMO

## 2014-10-14 ENCOUNTER — Observation Stay
Admission: EM | Admit: 2014-10-14 | Discharge: 2014-10-17 | Disposition: A | Discharge status: Home / Self Care | Payer: Commercial Managed Care - HMO | Attending: Internal Medicine | Admitting: Internal Medicine

## 2014-10-14 DIAGNOSIS — I82622 Acute embolism and thrombosis of deep veins of left upper extremity: Secondary | ICD-10-CM | POA: Diagnosis not present

## 2014-10-14 DIAGNOSIS — F329 Major depressive disorder, single episode, unspecified: Secondary | ICD-10-CM | POA: Diagnosis not present

## 2014-10-14 DIAGNOSIS — F319 Bipolar disorder, unspecified: Secondary | ICD-10-CM | POA: Insufficient documentation

## 2014-10-14 DIAGNOSIS — R600 Localized edema: Secondary | ICD-10-CM | POA: Diagnosis not present

## 2014-10-14 DIAGNOSIS — Z79899 Other long term (current) drug therapy: Secondary | ICD-10-CM | POA: Diagnosis not present

## 2014-10-14 DIAGNOSIS — G629 Polyneuropathy, unspecified: Secondary | ICD-10-CM | POA: Insufficient documentation

## 2014-10-14 DIAGNOSIS — F17200 Nicotine dependence, unspecified, uncomplicated: Secondary | ICD-10-CM | POA: Insufficient documentation

## 2014-10-14 DIAGNOSIS — J449 Chronic obstructive pulmonary disease, unspecified: Secondary | ICD-10-CM | POA: Diagnosis not present

## 2014-10-14 DIAGNOSIS — E039 Hypothyroidism, unspecified: Secondary | ICD-10-CM | POA: Insufficient documentation

## 2014-10-14 DIAGNOSIS — M7989 Other specified soft tissue disorders: Secondary | ICD-10-CM | POA: Insufficient documentation

## 2014-10-14 DIAGNOSIS — M797 Fibromyalgia: Secondary | ICD-10-CM | POA: Insufficient documentation

## 2014-10-14 DIAGNOSIS — E785 Hyperlipidemia, unspecified: Secondary | ICD-10-CM | POA: Insufficient documentation

## 2014-10-14 DIAGNOSIS — Z923 Personal history of irradiation: Secondary | ICD-10-CM | POA: Diagnosis not present

## 2014-10-14 DIAGNOSIS — K219 Gastro-esophageal reflux disease without esophagitis: Secondary | ICD-10-CM | POA: Insufficient documentation

## 2014-10-14 DIAGNOSIS — Z9221 Personal history of antineoplastic chemotherapy: Secondary | ICD-10-CM | POA: Insufficient documentation

## 2014-10-14 DIAGNOSIS — C218 Malignant neoplasm of overlapping sites of rectum, anus and anal canal: Secondary | ICD-10-CM | POA: Diagnosis not present

## 2014-10-14 DIAGNOSIS — F4310 Post-traumatic stress disorder, unspecified: Secondary | ICD-10-CM | POA: Diagnosis not present

## 2014-10-14 DIAGNOSIS — Z452 Encounter for adjustment and management of vascular access device: Secondary | ICD-10-CM | POA: Diagnosis not present

## 2014-10-14 DIAGNOSIS — I82402 Acute embolism and thrombosis of unspecified deep veins of left lower extremity: Secondary | ICD-10-CM

## 2014-10-14 DIAGNOSIS — F209 Schizophrenia, unspecified: Secondary | ICD-10-CM | POA: Insufficient documentation

## 2014-10-14 DIAGNOSIS — F419 Anxiety disorder, unspecified: Secondary | ICD-10-CM | POA: Insufficient documentation

## 2014-10-14 DIAGNOSIS — I10 Essential (primary) hypertension: Secondary | ICD-10-CM | POA: Insufficient documentation

## 2014-10-14 DIAGNOSIS — C20 Malignant neoplasm of rectum: Secondary | ICD-10-CM

## 2014-10-14 DIAGNOSIS — G4733 Obstructive sleep apnea (adult) (pediatric): Secondary | ICD-10-CM | POA: Diagnosis not present

## 2014-10-14 DIAGNOSIS — J45909 Unspecified asthma, uncomplicated: Secondary | ICD-10-CM | POA: Diagnosis not present

## 2014-10-14 DIAGNOSIS — M79602 Pain in left arm: Secondary | ICD-10-CM | POA: Diagnosis present

## 2014-10-14 DIAGNOSIS — I82629 Acute embolism and thrombosis of deep veins of unspecified upper extremity: Secondary | ICD-10-CM

## 2014-10-14 HISTORY — DX: Acute embolism and thrombosis of deep veins of unspecified upper extremity: I82.629

## 2014-10-14 LAB — CBC WITH DIFFERENTIAL/PLATELET
Basophils Absolute: 0.1 10*3/uL (ref 0–0.1)
Basophils Relative: 1 %
Eosinophils Absolute: 0.5 10*3/uL (ref 0–0.7)
Eosinophils Relative: 7 %
HEMATOCRIT: 36.6 % (ref 35.0–47.0)
HEMOGLOBIN: 12.4 g/dL (ref 12.0–16.0)
LYMPHS ABS: 0.8 10*3/uL — AB (ref 1.0–3.6)
Lymphocytes Relative: 10 %
MCH: 34.9 pg — AB (ref 26.0–34.0)
MCHC: 33.9 g/dL (ref 32.0–36.0)
MCV: 102.9 fL — ABNORMAL HIGH (ref 80.0–100.0)
MONOS PCT: 8 %
Monocytes Absolute: 0.6 10*3/uL (ref 0.2–0.9)
NEUTROS ABS: 5.6 10*3/uL (ref 1.4–6.5)
NEUTROS PCT: 74 %
Platelets: 133 10*3/uL — ABNORMAL LOW (ref 150–440)
RBC: 3.55 MIL/uL — ABNORMAL LOW (ref 3.80–5.20)
RDW: 15.5 % — ABNORMAL HIGH (ref 11.5–14.5)
WBC: 7.6 10*3/uL (ref 3.6–11.0)

## 2014-10-14 LAB — COMPREHENSIVE METABOLIC PANEL
ALK PHOS: 60 U/L (ref 38–126)
ALT: 9 U/L — ABNORMAL LOW (ref 14–54)
ANION GAP: 7 (ref 5–15)
AST: 23 U/L (ref 15–41)
Albumin: 2.9 g/dL — ABNORMAL LOW (ref 3.5–5.0)
BILIRUBIN TOTAL: 0.5 mg/dL (ref 0.3–1.2)
BUN: 6 mg/dL (ref 6–20)
CALCIUM: 8.6 mg/dL — AB (ref 8.9–10.3)
CO2: 27 mmol/L (ref 22–32)
Chloride: 105 mmol/L (ref 101–111)
Creatinine, Ser: 0.58 mg/dL (ref 0.44–1.00)
GLUCOSE: 101 mg/dL — AB (ref 65–99)
Potassium: 3.5 mmol/L (ref 3.5–5.1)
Sodium: 139 mmol/L (ref 135–145)
TOTAL PROTEIN: 6.5 g/dL (ref 6.5–8.1)

## 2014-10-14 LAB — APTT: aPTT: 35 seconds (ref 24–36)

## 2014-10-14 MED ORDER — APIXABAN 5 MG PO TABS: 10.0000 mg | ORAL_TABLET | Freq: Two times a day (BID) | ORAL | Status: DC

## 2014-10-14 MED ORDER — OXYCODONE HCL 5 MG PO TABS
5.0000 mg | ORAL_TABLET | ORAL | Status: DC | PRN
  Administered 2014-10-15 – 2014-10-17 (×2): 5 mg via ORAL
  Filled 2014-10-14 (×2): qty 1

## 2014-10-14 MED ORDER — ACETAMINOPHEN 325 MG PO TABS: 650.0000 mg | ORAL_TABLET | Freq: Four times a day (QID) | ORAL | Status: DC | PRN

## 2014-10-14 MED ORDER — HEPARIN (PORCINE) IN NACL 100-0.45 UNIT/ML-% IJ SOLN
12.0000 [IU]/kg/h | Freq: Once | INTRAMUSCULAR | Status: AC
  Administered 2014-10-14: 12 [IU]/kg/h via INTRAVENOUS
  Filled 2014-10-14: qty 250

## 2014-10-14 MED ORDER — MORPHINE SULFATE (PF) 2 MG/ML IV SOLN
2.0000 mg | INTRAVENOUS | Status: DC | PRN
  Administered 2014-10-15 – 2014-10-17 (×6): 2 mg via INTRAVENOUS
  Filled 2014-10-14 (×6): qty 1

## 2014-10-14 MED ORDER — OXYCODONE-ACETAMINOPHEN 5-325 MG PO TABS
2.0000 | ORAL_TABLET | Freq: Once | ORAL | Status: AC
  Administered 2014-10-14: 2 via ORAL
  Filled 2014-10-14: qty 2

## 2014-10-14 MED ORDER — DOCUSATE SODIUM 100 MG PO CAPS
100.0000 mg | ORAL_CAPSULE | Freq: Two times a day (BID) | ORAL | Status: DC
  Administered 2014-10-15 – 2014-10-17 (×3): 100 mg via ORAL
  Filled 2014-10-14 (×5): qty 1

## 2014-10-14 MED ORDER — ONDANSETRON HCL 4 MG/2ML IJ SOLN: 4.0000 mg | Freq: Four times a day (QID) | INTRAMUSCULAR | Status: DC | PRN

## 2014-10-14 MED ORDER — ACETAMINOPHEN 650 MG RE SUPP: 650.0000 mg | Freq: Four times a day (QID) | RECTAL | Status: DC | PRN

## 2014-10-14 MED ORDER — POLYETHYLENE GLYCOL 3350 17 G PO PACK: 17.0000 g | PACK | Freq: Every day | ORAL | Status: DC | PRN

## 2014-10-14 MED ORDER — ONDANSETRON HCL 4 MG PO TABS: 4.0000 mg | ORAL_TABLET | Freq: Four times a day (QID) | ORAL | Status: DC | PRN

## 2014-10-14 NOTE — ED Notes (Signed)
Patient returned from Ultrasound. 

## 2014-10-14 NOTE — ED Notes (Signed)
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

## 2014-10-14 NOTE — ED Provider Notes (Signed)
[facility] Emergency Department Provider Note     Time seen: ----------------------------------------- 7:26 PM on 10/14/2014 -----------------------------------------    I have reviewed the triage vital signs and the nursing notes.   HISTORY  Chief Complaint Arm Swelling    HPI [name] is a 59 y.o. female presents to ER with left arm swelling that started yesterday. She does complain of left arm pain as well, nothing makes it better or worse. She doesn't history DVT in the right arm and this feels similar. DVT in the right arm was about 5 years ago, she took Lovenox and Coumadin at that time. She's not had any recent trauma to the left arm, describes shortness of breath that is consistent with her COPD.   Past Medical History  Diagnosis Date  . Schizophrenia ([place])   . Asthma   . GERD (gastroesophageal reflux disease)   . Anxiety   . Depression   . Bipolar disorder ([place])   . COPD (chronic obstructive pulmonary disease) ([place])   . Occasional tremors     right hand  . PTSD (post-traumatic stress disorder)   . Shortness of breath dyspnea   . Fibromyalgia   . DVT (deep venous thrombosis) ([place]) 2011    RUE  . Thyroid nodule   . DDD (degenerative disc disease), lumbar   . Spinal stenosis   . Peripheral neuropathy ([place])   . Rotator cuff tear     right  . Pneumonia 2011  . Hypothyroidism     no meds currently  . Anemia     during pregnancy only  . Hypertension     Off meds x 15 years-well controlled now per pt  . Squamous cell cancer, anus (HCC)   . Severe obstructive sleep apnea 06/27/2014    Patient Active Problem List   Diagnosis Date Noted  . B12 deficiency 09/28/2014  . Folate deficiency 09/28/2014  . Macrocytosis 09/21/2014  . Depression   . Hypokalemia 07/20/2014  . Anal cancer ([place]) 06/27/2014  . Anal fissure 06/27/2014  . Anxiety 06/27/2014  . Airway hyperreactivity 06/27/2014  . H/O manic depressive disorder  06/27/2014  . CAFL (chronic airflow limitation) ([place]) 06/27/2014  . Clinical depression 06/27/2014  . Deep vein thrombosis ([place]) 06/27/2014  . External hemorrhoid 06/27/2014  . Myalgia and myositis 06/27/2014  . Acid reflux 06/27/2014  . Hemorrhoid 06/27/2014  . BP (high blood pressure) 06/27/2014  . HLD (hyperlipidemia) 06/27/2014  . Adult hypothyroidism 06/27/2014  . LBP (low back pain) 06/27/2014  . Mass of perianal area 06/27/2014  . External hemorrhoid, thrombosed 06/27/2014  . 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 Ulcerated hemorrhoid 06/27/2014  . Severe obstructive sleep apnea 06/27/2014  . Squamous cell cancer, anus (HCC)   . Rectal ulcer 05/17/2014    Past Surgical History  Procedure Laterality Date  . Foot surgery Right   . Tubal ligation    . Eye surgery Bilateral   . Mouth surgery  2002  . Rectal biopsy N/A 05/08/2014    Procedure: BIOPSY RECTAL;  Surgeon: Marlyce Huge, MD;  Location: [facility] ORS;  Service: General;  Laterality: N/A;  . Evaluation under anesthesia with hemorrhoidectomy N/A 05/08/2014    Procedure: EXAM UNDER ANESTHESIA WITH HEMORRHOIDECTOMY;  Surgeon: Marlyce Huge, MD;  Location: [facility] ORS;  Service: General;  Laterality: N/A;  . Portacath placement N/A 05/20/2014    Procedure: INSERTION PORT-A-CATH;  Surgeon: Florene Glen, MD;  Location: [facility] ORS;  Service: General;  Laterality: N/A;  . Laparoscopic  diverted colostomy N/A 05/26/2014    Procedure: LAPAROSCOPIC DIVERTED COLOSTOMY;  Surgeon: Marlyce Huge, MD;  Location: [facility] ORS;  Service: General;  Laterality: N/A;    Allergies Sulfa antibiotics  Social History Social History  Substance Use Topics  . Smoking status: Current Every Day Smoker -- 0.25 packs/day for 44 years    Types: Cigarettes    Start date: 10/04/1970  . Smokeless tobacco: Never Used  . Alcohol Use: No     Comment: 1 drink every 2-3 times/year    Review of Systems Constitutional: Negative for  fever. Eyes: Negative for visual changes. ENT: Negative for sore throat. Cardiovascular: Negative for chest pain. Respiratory: positive for chronic shortness of breath Gastrointestinal: Negative for abdominal pain, vomiting and diarrhea. Genitourinary: Negative for dysuria. Musculoskeletal: Negative for back pain.positive left arm swelling and pain Skin: Negative for rash. Neurological: Negative for headaches, focal weakness or numbness.  10-point ROS otherwise negative.  ____________________________________________   PHYSICAL EXAM:  VITAL SIGNS: ED Triage Vitals  Enc Vitals Group     BP 10/14/14 1909 126/64 mmHg     Pulse Rate 10/14/14 1909 81     Resp 10/14/14 1909 18     Temp 10/14/14 1909 98.2 F (36.8 C)     Temp Source 10/14/14 1909 Oral     SpO2 10/14/14 1909 96 %     Weight 10/14/14 1909 176 lb (79.833 kg)     Height 10/14/14 1909 5' 6.5" (1.689 m)     Head Cir --      Peak Flow --      Pain Score 10/14/14 1909 8     Pain Loc --      Pain Edu? --      Excl. in [place]? --     Constitutional: Alert and oriented. Well appearing and in no distress. Eyes: Conjunctivae are normal. PERRL. Normal extraocular movements. ENT   Head: Normocephalic and atraumatic.   Nose: No congestion/rhinnorhea.   Mouth/Throat: Mucous membranes are moist.   Neck: No stridor. Cardiovascular: Normal rate, regular rhythm. Normal and symmetric distal pulses are present in all extremities. No murmurs, rubs, or gallops. Respiratory: Normal respiratory effort without tachypnea nor retractions. Breath sounds are clear and equal bilaterally. Mild wheezing Gastrointestinal: Soft and nontender. No distention. No abdominal bruits.  Musculoskeletal: markedly edema of the left upper extremity compared to right, pain with range of motion left arm. Neurologic:  Normal speech and language. No gross focal neurologic deficits are appreciated. Speech is normal. No gait instability. Skin:  Skin is  warm, dry and intact. No rash noted. Psychiatric: Mood and affect are normal. Speech and behavior are normal. Patient exhibits appropriate insight and judgment. ____________________________________________  ED COURSE:  Pertinent labs & imaging results that were available during my care of the patient were reviewed by me and considered in my medical decision making (see chart for details). We'll check an ultrasound and basic labs. ____________________________________________    LABS (pertinent positives/negatives)  Labs Reviewed  CBC WITH DIFFERENTIAL/PLATELET - Abnormal; Notable for the following:    RBC 3.55 (*)    MCV 102.9 (*)    MCH 34.9 (*)    RDW 15.5 (*)    Platelets 133 (*)    Lymphs Abs 0.8 (*)    All other components within normal limits  COMPREHENSIVE METABOLIC PANEL - Abnormal; Notable for the following:    Glucose, Bld 101 (*)    Calcium 8.6 (*)    Albumin 2.9 (*)    ALT  9 (*)    All other components within normal limits    RADIOLOGY Images were viewed by me  Ultrasound of the left upper extremity IMPRESSION: Acute DVT within the left upper extremity.  Critical Value/emergent results were called by telephone at the time of interpretation on 10/14/2014 at 9:34 pm to Dr. Lenise Arena , who verbally acknowledged these results. ____________________________________________  FINAL ASSESSMENT AND PLAN  Edema, left upper extremity DVT  Plan: Patient with labs and imaging as dictated above. Patient has extensive thrombosis in the left upper extremity. I discussed this with Dr. Lucky Cowboy and we have given the patient an option of going home versus being admitted and having the surgery over the next several days. Patient very much wants to stay in the hospital, will start her on heparin. I will discuss this with the hospitalist and vascular surgery will be consult to for port removal.   Earleen Newport, MD   Earleen Newport, MD 10/14/14 2212

## 2014-10-14 NOTE — ED Notes (Signed)
Patient transported to Ultrasound via stretcher with [name] tech.

## 2014-10-14 NOTE — H&P (Signed)
[facility] [place] NAME: [name]    MR#:  [id number]  DATE OF BIRTH:  1955-07-14   DATE OF ADMISSION:  10/14/2014  PRIMARY CARE PHYSICIAN: Lavera Guise, MD   REQUESTING/REFERRING PHYSICIAN: Jimmye Norman  CHIEF COMPLAINT:   Chief Complaint  Patient presents with  . Arm Swelling    HISTORY OF PRESENT ILLNESS:  [name]  is a 59 y.o. female with a known history of anorectal cancer status post chemotherapy and radiation presenting with left arm swelling. She describes one day duration of left arm swelling with associated redness and pain pain described as throbbing, 5/10 intensity no worsening or relieving factors. Given progressive symptoms and at the request of her family she decided to present to the hospital for further workup and evaluation. On initial workup she is found to have a large left upper extremity DVT. She denies any chest pain, shortness of breath. She does have a left subclavian port in place which is no longer in use as she has completed chemotherapy.  PAST MEDICAL HISTORY:   Past Medical History  Diagnosis Date  . Schizophrenia ([place])   . Asthma   . GERD (gastroesophageal reflux disease)   . Anxiety   . Depression   . Bipolar disorder ([place])   . COPD (chronic obstructive pulmonary disease) ([place])   . Occasional tremors     right hand  . PTSD (post-traumatic stress disorder)   . Shortness of breath dyspnea   . Fibromyalgia   . DVT (deep venous thrombosis) ([place]) 2011    RUE  . Thyroid nodule   . DDD (degenerative disc disease), lumbar   . Spinal stenosis   . Peripheral neuropathy ([place])   . Rotator cuff tear     right  . Pneumonia 2011  . Hypothyroidism     no meds currently  . Anemia     during pregnancy only  . Hypertension     Off meds x 15 years-well controlled now per pt  . Squamous cell cancer, anus (HCC)   . Severe obstructive sleep apnea 06/27/2014  . DVT of upper extremity  (deep vein thrombosis) ([place]) 10/14/2014    PAST SURGICAL HISTORY:   Past Surgical History  Procedure Laterality Date  . Foot surgery Right   . Tubal ligation    . Eye surgery Bilateral   . Mouth surgery  2002  . Rectal biopsy N/A 05/08/2014    Procedure: BIOPSY RECTAL;  Surgeon: Marlyce Huge, MD;  Location: [facility] ORS;  Service: General;  Laterality: N/A;  . Evaluation under anesthesia with hemorrhoidectomy N/A 05/08/2014    Procedure: EXAM UNDER ANESTHESIA WITH HEMORRHOIDECTOMY;  Surgeon: Marlyce Huge, MD;  Location: [facility];  Service: General;  Laterality: N/A;  . Portacath placement N/A 05/20/2014    Procedure: INSERTION PORT-A-CATH;  Surgeon: Florene Glen, MD;  Location: [facility] ORS;  Service: General;  Laterality: N/A;  . Laparoscopic diverted colostomy N/A 05/26/2014    Procedure: LAPAROSCOPIC DIVERTED COLOSTOMY;  Surgeon: Marlyce Huge, MD;  Location: [facility] ORS;  Service: General;  Laterality: N/A;    SOCIAL HISTORY:   Social History  Substance Use Topics  . Smoking status: Current Every Day Smoker -- 0.25 packs/day for 44 years    Types: Cigarettes    Start date: 10/04/1970  . Smokeless tobacco: Never Used  . Alcohol Use: No     Comment: 1 drink every 2-3 times/year    FAMILY HISTORY:   Family  History  Problem Relation Age of Onset  . Cancer Maternal Aunt   . Cancer Paternal Uncle   . Diabetes Mother   . Thyroid disease Mother   . Kidney failure Mother   . Hypertension Mother   . Depression Mother     DRUG ALLERGIES:   Allergies  Allergen Reactions  . Sulfa Antibiotics Hives    REVIEW OF SYSTEMS:  REVIEW OF SYSTEMS:  CONSTITUTIONAL: Denies fevers, chills, fatigue, weakness.  EYES: Denies blurred vision, double vision, or eye pain.  EARS, NOSE, THROAT: Denies tinnitus, ear pain, hearing loss.  RESPIRATORY: denies cough, shortness of breath, wheezing  CARDIOVASCULAR: Denies chest pain, palpitations, edema.  GASTROINTESTINAL: Denies  nausea, vomiting, diarrhea, abdominal pain.  GENITOURINARY: Denies dysuria, hematuria.  ENDOCRINE: Denies nocturia or thyroid problems. HEMATOLOGIC AND LYMPHATIC: Denies easy bruising or bleeding.  SKIN: Denies rash or lesions.  MUSCULOSKELETAL: Denies pain in neck, back, shoulder, knees, hips, or further arthritic symptoms. Left arm swelling as stated above  NEUROLOGIC: Denies paralysis, paresthesias.  PSYCHIATRIC: Denies anxiety or depressive symptoms. Otherwise full review of systems performed by me is negative.   MEDICATIONS AT HOME:   Prior to Admission medications   Medication Sig Start Date End Date Taking? Authorizing Provider  albuterol (PROVENTIL HFA;VENTOLIN HFA) 108 (90 BASE) MCG/ACT inhaler Inhale 1 puff into the lungs every 6 (six) hours as needed for wheezing or shortness of breath.    Historical Provider, MD  ARIPiprazole (ABILIFY) 2 MG tablet Take 1 tablet (2 mg total) by mouth daily. 10/04/14   Marjie Skiff, MD  budesonide-formoterol [facility]) 160-4.5 MCG/ACT inhaler Inhale 2 puffs into the lungs 2 (two) times daily. 05/30/14   Sherri Rad, MD  Calcium Carb-Cholecalciferol (CALCIUM 600+D) 600-800 MG-UNIT TABS Take 1 tablet by mouth 2 (two) times daily.     Historical Provider, MD  clonazePAM (KLONOPIN) 1 MG tablet Take 1 tablet (1 mg total) by mouth 3 (three) times daily as needed for anxiety. 10/04/14   Marjie Skiff, MD  DULoxetine (CYMBALTA) 60 MG capsule Take 1 capsule (60 mg total) by mouth daily. 10/04/14   Marjie Skiff, MD  escitalopram (LEXAPRO) 20 MG tablet Take 1 tablet (20 mg total) by mouth daily. 10/04/14   Marjie Skiff, MD  etodolac (LODINE) 500 MG tablet Take 500 mg by mouth 2 (two) times daily.    Historical Provider, MD  lidocaine (XYLOCAINE) 2 % jelly Apply 1 application topically as needed (for pain).    Historical Provider, MD  oxybutynin (DITROPAN) 5 MG tablet Take 5 mg by mouth 2 (two) times daily.    Historical Provider, MD   oxyCODONE-acetaminophen (PERCOCET) 10-325 MG tablet Take 1 tablet by mouth every 6 (six) hours as needed for pain. 10/06/14   Lequita Asal, MD  potassium chloride SA (K-DUR,KLOR-CON) 20 MEQ tablet Take 1 tablet (20 mEq total) by mouth 2 (two) times daily. for 3 days then 1 pill a day 09/07/14   Lequita Asal, MD  psyllium (METAMUCIL) 58.6 % powder Take 1 packet by mouth 2 (two) times daily as needed (for constipation).    Historical Provider, MD  ranitidine (ZANTAC) 150 MG tablet Take 150 mg by mouth 2 (two) times daily.    Historical Provider, MD  senna (SENOKOT) 8.6 MG tablet Take 2 tablets by mouth 2 (two) times daily as needed for constipation.    Historical Provider, MD  simvastatin (ZOCOR) 40 MG tablet Take 40 mg by mouth at bedtime.  Historical Provider, MD  sucralfate (CARAFATE) 1 G tablet Take 1 tablet (1 g total) by mouth 3 (three) times daily. Dissolve tablet in 2-3 tbsp of warm water; swish and swallow. 08/09/14   Noreene Filbert, MD  tiotropium (SPIRIVA) 18 MCG inhalation capsule Place 18 mcg into inhaler and inhale daily.    Historical Provider, MD  traMADol Veatrice Bourbon) 50 MG tablet  08/08/14   Historical Provider, MD  zolpidem (AMBIEN) 10 MG tablet Take 1 tablet (10 mg total) by mouth at bedtime as needed for sleep. 10/04/14 11/03/14  Marjie Skiff, MD      VITAL SIGNS:  Blood pressure 111/60, pulse 73, temperature 98.2 F (36.8 C), temperature source Oral, resp. rate 18, height 5' 6.5" (1.689 m), weight 176 lb (79.833 kg), SpO2 97 %.  PHYSICAL EXAMINATION:  VITAL SIGNS: Filed Vitals:   10/14/14 2130  BP: 111/60  Pulse: 73  Temp:   Resp:    GENERAL:59 y.o.female currently in no acute distress.  HEAD: Normocephalic, atraumatic.  EYES: Pupils equal, round, reactive to light. Extraocular muscles intact. No scleral icterus.  MOUTH: Moist mucosal membrane. Dentition intact. No abscess noted.  EAR, NOSE, THROAT: Clear without exudates. No external lesions.  NECK:  Supple. No thyromegaly. No nodules. No JVD.  PULMONARY: Clear to ascultation, without wheeze rails or rhonci. No use of accessory muscles, Good respiratory effort. good air entry bilaterally CHEST: Nontender to palpation.  CARDIOVASCULAR: S1 and S2. Regular rate and rhythm. No murmurs, rubs, or gallops. No edema. Pedal pulses 2+ bilaterally.  GASTROINTESTINAL: Soft, nontender, nondistended. No masses. Positive bowel sounds. No hepatosplenomegaly.  MUSCULOSKELETAL:Left upper extremity swelling,  no clubbing, or edema. Range of motion full in all extremities.  NEUROLOGIC: Cranial nerves II through XII are intact. No gross focal neurological deficits. Sensation intact. Reflexes intact.  SKIN:Erythema left upper extremity most prominent proximal to the elbow  No ulceration, lesions, rashes, or cyanosis. Skin warm and dry. Turgor intact.  PSYCHIATRIC: Mood, affect within normal limits. The patient is awake, alert and oriented x 3. Insight, judgment intact.    LABORATORY PANEL:   CBC  Recent Labs Lab 10/14/14 1945  WBC 7.6  HGB 12.4  HCT 36.6  PLT 133*   ------------------------------------------------------------------------------------------------------------------  Chemistries   Recent Labs Lab 10/14/14 1945  NA 139  K 3.5  CL 105  CO2 27  GLUCOSE 101*  BUN 6  CREATININE 0.58  CALCIUM 8.6*  AST 23  ALT 9*  ALKPHOS 60  BILITOT 0.5   ------------------------------------------------------------------------------------------------------------------  Cardiac Enzymes No results for input(s): TROPONINI in the last 168 hours. ------------------------------------------------------------------------------------------------------------------  RADIOLOGY:  US Venous Img Upper Uni Left  10/14/2014   CLINICAL DATA:  Patient with left arm pain and swelling.  EXAM: LEFT UPPER EXTREMITY VENOUS DOPPLER ULTRASOUND  TECHNIQUE: Gray-scale sonography with graded compression, as well as color  Doppler and duplex ultrasound were performed to evaluate the upper extremity deep venous system from the level of the subclavian vein and including the jugular, axillary, basilic, radial, ulnar and upper cephalic vein. Spectral Doppler was utilized to evaluate flow at rest and with distal augmentation maneuvers.  COMPARISON:  None.  FINDINGS: There is acute thrombus demonstrated within the left internal jugular, subclavian and axillary veins extending into 1 of the brachial veins proximally. Thrombus is also demonstrated within the proximal aspect of the cephalic and basilic veins. Catheter is demonstrated within subclavian vein.  IMPRESSION: Acute DVT within the left upper extremity.  Critical Value/emergent results were called by telephone at  the time of interpretation on 10/14/2014 at 9:34 pm to Dr. Lenise Arena , who verbally acknowledged these results.   Electronically Signed   By: Lovey Newcomer M.D.   On: 10/14/2014 21:38    EKG:   Orders placed or performed during the hospital encounter of 08/04/14  . ED EKG  . ED EKG  . EKG    IMPRESSION AND PLAN:   59 year old Caucasian female history of anorectal carcinoma status post chemotherapy radiation presenting with left upper extremity swelling  1. Extensive left upper extremity DVT: Initiated on heparin drip, consult vascular surgery cases are given discussed with them by emergency department staff, provide pain control as required 2. COPD unspecified: Continue home regimen including Spiriva, Symbicort 3. Hyperlipidemia unspecified: Zocor 4. GERD without esophagitis: Zantac 5. Venous thromboembolism prophylactic: Therapeutic heparin   All the records are reviewed and case discussed with ED provider. Management plans discussed with the patient, family and they are in agreement.  CODE STATUS: Full  TOTAL TIME TAKING CARE OF THIS PATIENT: 35  minutes.    Hower,  Karenann Cai.D on 10/14/2014 at 11:17 PM  Between 7am to 6pm - Pager -  [phone]  After 6pm: House Pager: - [facility]  Office  [phone]  CC: Primary care physician; Lavera Guise, MD

## 2014-10-14 NOTE — ED Notes (Signed)
Pt reports hx of DVT in her right arm about a year ago. Yesterday pt reports she developed pain and swelling to her left arm that started in her lower arm at her wrist and going up to her left axilla region and into the left side of her left breast. Pt denies injury or other sx at this time.

## 2014-10-15 DIAGNOSIS — I82622 Acute embolism and thrombosis of deep veins of left upper extremity: Secondary | ICD-10-CM | POA: Diagnosis not present

## 2014-10-15 LAB — CBC
HCT: 34 % — ABNORMAL LOW (ref 35.0–47.0)
Hemoglobin: 11.5 g/dL — ABNORMAL LOW (ref 12.0–16.0)
MCH: 34.5 pg — AB (ref 26.0–34.0)
MCHC: 33.8 g/dL (ref 32.0–36.0)
MCV: 102.2 fL — ABNORMAL HIGH (ref 80.0–100.0)
PLATELETS: 133 10*3/uL — AB (ref 150–440)
RBC: 3.33 MIL/uL — AB (ref 3.80–5.20)
RDW: 15.3 % — ABNORMAL HIGH (ref 11.5–14.5)
WBC: 5.9 10*3/uL (ref 3.6–11.0)

## 2014-10-15 LAB — BASIC METABOLIC PANEL
Anion gap: 5 (ref 5–15)
BUN: 6 mg/dL (ref 6–20)
CALCIUM: 8.5 mg/dL — AB (ref 8.9–10.3)
CO2: 27 mmol/L (ref 22–32)
CREATININE: 0.57 mg/dL (ref 0.44–1.00)
Chloride: 108 mmol/L (ref 101–111)
GFR calc non Af Amer: >60 mL/min (ref >60)
Glucose, Bld: 100 mg/dL — ABNORMAL HIGH (ref 65–99)
Potassium: 3.5 mmol/L (ref 3.5–5.1)
Sodium: 140 mmol/L (ref 135–145)

## 2014-10-15 LAB — PROTIME-INR
INR: 1.03
Prothrombin Time: 13.7 seconds (ref 11.4–15.0)

## 2014-10-15 LAB — HEPARIN LEVEL (UNFRACTIONATED)
HEPARIN UNFRACTIONATED: 0.33 [IU]/mL (ref 0.30–0.70)
Heparin Unfractionated: 0.54 IU/mL (ref 0.30–0.70)

## 2014-10-15 MED ORDER — ETODOLAC 500 MG PO TABS
500.0000 mg | ORAL_TABLET | Freq: Two times a day (BID) | ORAL | Status: DC
  Administered 2014-10-15 – 2014-10-17 (×5): 500 mg via ORAL
  Filled 2014-10-15 (×7): qty 1

## 2014-10-15 MED ORDER — CALCIUM CARB-CHOLECALCIFEROL 600-800 MG-UNIT PO TABS: 1.0000 | ORAL_TABLET | Freq: Two times a day (BID) | ORAL | Status: DC

## 2014-10-15 MED ORDER — HEPARIN BOLUS VIA INFUSION
4000.0000 [IU] | Freq: Once | INTRAVENOUS | Status: AC
  Administered 2014-10-15: 01:00:00 4000 [IU] via INTRAVENOUS
  Filled 2014-10-15: qty 4000

## 2014-10-15 MED ORDER — SENNA 8.6 MG PO TABS: 2.0000 | ORAL_TABLET | Freq: Two times a day (BID) | ORAL | Status: DC | PRN

## 2014-10-15 MED ORDER — TIOTROPIUM BROMIDE MONOHYDRATE 18 MCG IN CAPS
18.0000 ug | ORAL_CAPSULE | Freq: Every day | RESPIRATORY_TRACT | Status: DC
  Administered 2014-10-15 – 2014-10-17 (×3): 18 ug via RESPIRATORY_TRACT
  Filled 2014-10-15: qty 5

## 2014-10-15 MED ORDER — ARIPIPRAZOLE 2 MG PO TABS
2.0000 mg | ORAL_TABLET | Freq: Every day | ORAL | Status: DC
  Administered 2014-10-15 – 2014-10-17 (×3): 2 mg via ORAL
  Filled 2014-10-15 (×4): qty 1

## 2014-10-15 MED ORDER — FAMOTIDINE 20 MG PO TABS
20.0000 mg | ORAL_TABLET | Freq: Two times a day (BID) | ORAL | Status: DC
  Administered 2014-10-15 – 2014-10-17 (×4): 20 mg via ORAL
  Filled 2014-10-15 (×5): qty 1

## 2014-10-15 MED ORDER — HEPARIN (PORCINE) IN NACL 100-0.45 UNIT/ML-% IJ SOLN
12.0000 [IU]/kg/h | INTRAMUSCULAR | Status: DC
  Administered 2014-10-15: 12 [IU]/kg/h via INTRAVENOUS

## 2014-10-15 MED ORDER — OXYBUTYNIN CHLORIDE 5 MG PO TABS
5.0000 mg | ORAL_TABLET | Freq: Two times a day (BID) | ORAL | Status: DC
  Administered 2014-10-15 – 2014-10-17 (×4): 5 mg via ORAL
  Filled 2014-10-15 (×5): qty 1

## 2014-10-15 MED ORDER — CHLORHEXIDINE GLUCONATE 4 % EX LIQD
1.0000 "application " | Freq: Once | CUTANEOUS | Status: AC
  Administered 2014-10-16: 1 via TOPICAL

## 2014-10-15 MED ORDER — CLONAZEPAM 1 MG PO TABS
1.0000 mg | ORAL_TABLET | Freq: Three times a day (TID) | ORAL | Status: DC | PRN
  Administered 2014-10-15: 01:00:00 1 mg via ORAL
  Filled 2014-10-15: qty 1

## 2014-10-15 MED ORDER — HEPARIN (PORCINE) IN NACL 100-0.45 UNIT/ML-% IJ SOLN
1300.0000 [IU]/h | INTRAMUSCULAR | Status: DC
  Administered 2014-10-15 – 2014-10-16 (×3): 1300 [IU]/h via INTRAVENOUS
  Filled 2014-10-15 (×5): qty 250

## 2014-10-15 MED ORDER — ESCITALOPRAM OXALATE 10 MG PO TABS
20.0000 mg | ORAL_TABLET | Freq: Every day | ORAL | Status: DC
  Administered 2014-10-15 – 2014-10-17 (×3): 20 mg via ORAL
  Filled 2014-10-15 (×3): qty 2

## 2014-10-15 MED ORDER — BUDESONIDE-FORMOTEROL FUMARATE 160-4.5 MCG/ACT IN AERO
2.0000 | INHALATION_SPRAY | Freq: Two times a day (BID) | RESPIRATORY_TRACT | Status: DC
  Administered 2014-10-15 – 2014-10-17 (×4): 2 via RESPIRATORY_TRACT
  Filled 2014-10-15: qty 6

## 2014-10-15 MED ORDER — LIDOCAINE HCL 2 % EX GEL: 1.0000 "application " | CUTANEOUS | Status: DC | PRN

## 2014-10-15 MED ORDER — CALCIUM CARBONATE-VITAMIN D 500-200 MG-UNIT PO TABS
1.0000 | ORAL_TABLET | Freq: Two times a day (BID) | ORAL | Status: DC
  Administered 2014-10-15 – 2014-10-17 (×5): 1 via ORAL
  Filled 2014-10-15 (×5): qty 1

## 2014-10-15 MED ORDER — DULOXETINE HCL 60 MG PO CPEP
60.0000 mg | ORAL_CAPSULE | Freq: Every day | ORAL | Status: DC
  Administered 2014-10-15 – 2014-10-17 (×3): 60 mg via ORAL
  Filled 2014-10-15 (×3): qty 1

## 2014-10-15 MED ORDER — SUCRALFATE 1 G PO TABS
1.0000 g | ORAL_TABLET | Freq: Three times a day (TID) | ORAL | Status: DC
  Administered 2014-10-15 – 2014-10-17 (×6): 1 g via ORAL
  Filled 2014-10-15 (×6): qty 1

## 2014-10-15 MED ORDER — SODIUM CHLORIDE 0.9 % IV SOLN: INTRAVENOUS | Status: DC

## 2014-10-15 MED ORDER — ZOLPIDEM TARTRATE 5 MG PO TABS
10.0000 mg | ORAL_TABLET | Freq: Every evening | ORAL | Status: DC | PRN
  Administered 2014-10-15: 10 mg via ORAL
  Filled 2014-10-15: qty 2

## 2014-10-15 MED ORDER — ALBUTEROL SULFATE (2.5 MG/3ML) 0.083% IN NEBU: 3.0000 mL | INHALATION_SOLUTION | Freq: Four times a day (QID) | RESPIRATORY_TRACT | Status: DC | PRN

## 2014-10-15 MED ORDER — SIMVASTATIN 40 MG PO TABS
40.0000 mg | ORAL_TABLET | Freq: Every day | ORAL | Status: DC
  Administered 2014-10-15 – 2014-10-16 (×3): 40 mg via ORAL
  Filled 2014-10-15 (×3): qty 1

## 2014-10-15 NOTE — Progress Notes (Signed)
[facility] [place] NAME: [name]    MR#:  [id number]  DATE OF BIRTH:  1955-11-19  SUBJECTIVE:  CHIEF COMPLAINT:   Chief Complaint  Patient presents with  . Arm Swelling   - Admitted for left arm DVT associated with Port-A-Cath. Significant swelling and pain noted of the left upper arm. -Was kept nothing by mouth for potential procedure today, complains of being hungry.  REVIEW OF SYSTEMS:  Review of Systems  Constitutional: Negative for fever, chills and malaise/fatigue.  HENT: Negative for congestion, ear discharge and nosebleeds.   Eyes: Negative for blurred vision and double vision.  Respiratory: Negative for cough, shortness of breath and wheezing.   Cardiovascular: Negative for chest pain, palpitations and leg swelling.  Gastrointestinal: Negative for nausea, vomiting, abdominal pain, diarrhea and constipation.  Genitourinary: Negative for dysuria and frequency.  Musculoskeletal: Positive for myalgias.       Left arm swelling and pain  Neurological: Negative for dizziness, sensory change, speech change, focal weakness, seizures, weakness and headaches.  Psychiatric/Behavioral: Negative for depression. The patient is not nervous/anxious.     DRUG ALLERGIES:   Allergies  Allergen Reactions  . Sulfa Antibiotics Hives    VITALS:  Blood pressure 112/54, pulse 75, temperature 98.2 F (36.8 C), temperature source Oral, resp. rate 18, height '5\' 5"'$  (1.651 m), weight 88.179 kg (194 lb 6.4 oz), SpO2 95 %.  PHYSICAL EXAMINATION:  Physical Exam  GENERAL:  59 y.o.-year-old patient lying in the bed with no acute distress.  EYES: Pupils equal, round, reactive to light and accommodation. No scleral icterus. Extraocular muscles intact.  HEENT: Head atraumatic, normocephalic. Oropharynx and nasopharynx clear.  NECK:  Supple, no jugular venous distention. No thyroid enlargement, no tenderness.  LUNGS: Normal breath sounds  bilaterally, no wheezing, rales,rhonchi or crepitation. No use of accessory muscles of respiration.  CARDIOVASCULAR: S1, S2 normal. No murmurs, rubs, or gallops.  ABDOMEN: Soft, nontender, nondistended. Bowel sounds present. No organomegaly or mass.  EXTREMITIES: Left arm is discolored, swollen and tender to touch.  No pedal edema, cyanosis, or clubbing.  NEUROLOGIC: Cranial nerves II through XII are intact. Muscle strength 5/5 in all extremities. Sensation intact. Gait not checked.  PSYCHIATRIC: The patient is alert and oriented x 3.  SKIN: No obvious rash, lesion, or ulcer.    LABORATORY PANEL:   CBC  Recent Labs Lab 10/15/14 0623  WBC 5.9  HGB 11.5*  HCT 34.0*  PLT 133*   ------------------------------------------------------------------------------------------------------------------  Chemistries   Recent Labs Lab 10/14/14 1945 10/15/14 0623  NA 139 140  K 3.5 3.5  CL 105 108  CO2 27 27  GLUCOSE 101* 100*  BUN 6 6  CREATININE 0.58 0.57  CALCIUM 8.6* 8.5*  AST 23  --   ALT 9*  --   ALKPHOS 60  --   BILITOT 0.5  --    ------------------------------------------------------------------------------------------------------------------  Cardiac Enzymes No results for input(s): TROPONINI in the last 168 hours. ------------------------------------------------------------------------------------------------------------------  RADIOLOGY:  US Venous Img Upper Uni Left  10/14/2014   CLINICAL DATA:  Patient with left arm pain and swelling.  EXAM: LEFT UPPER EXTREMITY VENOUS DOPPLER ULTRASOUND  TECHNIQUE: Gray-scale sonography with graded compression, as well as color Doppler and duplex ultrasound were performed to evaluate the upper extremity deep venous system from the level of the subclavian vein and including the jugular, axillary, basilic, radial, ulnar and upper cephalic vein. Spectral Doppler was utilized to evaluate flow at rest and with  distal augmentation maneuvers.   COMPARISON:  None.  FINDINGS: There is acute thrombus demonstrated within the left internal jugular, subclavian and axillary veins extending into 1 of the brachial veins proximally. Thrombus is also demonstrated within the proximal aspect of the cephalic and basilic veins. Catheter is demonstrated within subclavian vein.  IMPRESSION: Acute DVT within the left upper extremity.  Critical Value/emergent results were called by telephone at the time of interpretation on 10/14/2014 at 9:34 pm to Dr. Lenise Arena , who verbally acknowledged these results.   Electronically Signed   By: Lovey Newcomer M.D.   On: 10/14/2014 21:38    EKG:   Orders placed or performed during the hospital encounter of 08/04/14  . ED EKG  . ED EKG  . EKG    ASSESSMENT AND PLAN:    59 year old female with past medical history significant for annual rectal cancer status post chemotherapy and radiation presents with left arm DVT  #1 deep venous thrombosis-in the left arm -Secondary to Port-A-Cath related and also having malignancy (hypercoagulable state) -Continue IV heparin for now. -Appreciate vascular consult. For thrombolysis and thrombectomy, Port-A-Cath removal tomorrow  - Will be discharged on a newer anticoagulant- either Xareltor eliquis.   #2 stage II ano rectal cancer-follows with Dr. Mike Gip -Finished chemotherapy. Port-A-Cath will be removed tomorrow. -Still has some radiation treatments which are currently on hold due to wound care treatment of her perineal area. -Follow up with cancer Center as recommended  #3 depression and schizophrenia-prior history of behavioral medicine admission. -Currently stable. Continue her Lexapro, Abilify, Klonopin  #4  COPD-stable. No indication for systemic steroids. -Continue her inhalers    #5 DVT prophylaxis- on IV heparin   All the records are reviewed and case discussed with Care Management/Social Workerr. Management plans discussed with the patient, family and  they are in agreement.  CODE STATUS: Full code  TOTAL TIME TAKING CARE OF THIS PATIENT: 32  minutes.   POSSIBLE D/C IN 1-2 DAYS, DEPENDING ON CLINICAL CONDITION.   [name] M.D on 10/15/2014 at 11:23 AM  Between 7am to 6pm - Pager - [phone]  After 6pm go to [email or web address] - password [email or web address] [employer] [facility]  Office  [phone]  CC: Primary care physician; Lavera Guise, MD

## 2014-10-15 NOTE — Progress Notes (Signed)
ANTICOAGULATION CONSULT NOTE - Initial Consult  Pharmacy Consult for heparin drip Indication: UEDVT  Allergies  Allergen Reactions  . Sulfa Antibiotics Hives    Patient Measurements: Height: '5\' 5"'$  (165.1 cm) Weight: 194 lb 6.4 oz (88.179 kg) IBW/kg (Calculated) : 57 Heparin Dosing Weight: 76kg  Vital Signs: Temp: 98.2 F (36.8 C) (10/09 0502) Temp Source: Oral (10/09 0502) BP: 112/54 mmHg (10/09 0957) Pulse Rate: 75 (10/09 0957)  Labs:  Recent Labs  10/14/14 1945 10/14/14 2212 10/15/14 0623 10/15/14 0624 10/15/14 1234  HGB 12.4  --  11.5*  --   --   HCT 36.6  --  34.0*  --   --   PLT 133*  --  133*  --   --   APTT  --  35  --   --   --   LABPROT  --  13.7  --   --   --   INR  --  1.03  --   --   --   HEPARINUNFRC  --   --   --  0.33 0.54  CREATININE 0.58  --  0.57  --   --     Estimated Creatinine Clearance: 84.1 mL/min (by C-G formula based on Cr of 0.57).   Medical History: Past Medical History  Diagnosis Date  . Schizophrenia ([place])   . Asthma   . GERD (gastroesophageal reflux disease)   . Anxiety   . Depression   . Bipolar disorder ([place])   . COPD (chronic obstructive pulmonary disease) ([place])   . Occasional tremors     right hand  . PTSD (post-traumatic stress disorder)   . Shortness of breath dyspnea   . Fibromyalgia   . DVT (deep venous thrombosis) ([place]) 2011    RUE  . Thyroid nodule   . DDD (degenerative disc disease), lumbar   . Spinal stenosis   . Peripheral neuropathy ([place])   . Rotator cuff tear     right  . Pneumonia 2011  . Hypothyroidism     no meds currently  . Anemia     during pregnancy only  . Hypertension     Off meds x 15 years-well controlled now per pt  . Squamous cell cancer, anus (HCC)   . Severe obstructive sleep apnea 06/27/2014  . DVT of upper extremity (deep vein thrombosis) ([place]) 10/14/2014    Medications:    Assessment: 59 y/o F with extensive LUE DVT.   Goal of Therapy:  Heparin level 0.3-0.7  units/ml Monitor platelets by anticoagulation protocol: Yes   Plan:  Heparin level remains at goal so will continue heparin drip at  1300 unis/hr and f/u am labs.   Ulice Dash D 10/15/2014,1:11 PM

## 2014-10-15 NOTE — Plan of Care (Signed)
Problem: Discharge Progression Outcomes Goal: Other Discharge Outcomes/Goals Outcome: Progressing VSS, Pain medication once, NPO after midnight for port removal and thrombectomy tomorrow for left arm DVT, remains alert and oriented remains on heparin drip at 67m/hr

## 2014-10-15 NOTE — Progress Notes (Addendum)
ANTICOAGULATION CONSULT NOTE - Initial Consult  Pharmacy Consult for heparin drip Indication: UEDVT  Allergies  Allergen Reactions  . Sulfa Antibiotics Hives    Patient Measurements: Height: '5\' 5"'$  (165.1 cm) Weight: 194 lb 6.4 oz (88.179 kg) IBW/kg (Calculated) : 57 Heparin Dosing Weight: 76kg  Vital Signs: Temp: 98.2 F (36.8 C) (10/09 0026) Temp Source: Oral (10/09 0026) BP: 130/55 mmHg (10/09 0026) Pulse Rate: 77 (10/09 0026)  Labs:  Recent Labs  10/14/14 1945 10/14/14 2212  HGB 12.4  --   HCT 36.6  --   PLT 133*  --   APTT  --  35  CREATININE 0.58  --     Estimated Creatinine Clearance: 84.1 mL/min (by C-G formula based on Cr of 0.58).   Medical History: Past Medical History  Diagnosis Date  . Schizophrenia ([place])   . Asthma   . GERD (gastroesophageal reflux disease)   . Anxiety   . Depression   . Bipolar disorder ([place])   . COPD (chronic obstructive pulmonary disease) ([place])   . Occasional tremors     right hand  . PTSD (post-traumatic stress disorder)   . Shortness of breath dyspnea   . Fibromyalgia   . DVT (deep venous thrombosis) ([place]) 2011    RUE  . Thyroid nodule   . DDD (degenerative disc disease), lumbar   . Spinal stenosis   . Peripheral neuropathy ([place])   . Rotator cuff tear     right  . Pneumonia 2011  . Hypothyroidism     no meds currently  . Anemia     during pregnancy only  . Hypertension     Off meds x 15 years-well controlled now per pt  . Squamous cell cancer, anus (HCC)   . Severe obstructive sleep apnea 06/27/2014  . DVT of upper extremity (deep vein thrombosis) ([place]) 10/14/2014    Medications:    Assessment: Hgb 12.4  plt 133 aPTT 35  INR 1.03  Goal of Therapy:  Heparin level 0.3-0.7 units/ml Monitor platelets by anticoagulation protocol: Yes   Plan:  Pt started on 12 units/kg/hr in ED. Bolus of 4000 units ordered and rate increased to 1300 units/hr. First anti-Xa 6 hours after start of new rate.  10/9 AM  heparin level 0.33. Recheck in 6 hours to confirm.  [name] 10/15/2014,12:41 AM

## 2014-10-15 NOTE — Consult Note (Signed)
[facility] Vascular Consult Note  MRN : [id number]  [name] is a 59 y.o. (Sep 26, 1955) female who presents with chief complaint of  Chief Complaint  Patient presents with  . Arm Swelling  .  History of Present Illness: Patient is admitted with a 1-2 day history of left upper extremity swelling and pain. This started abruptly without a clear cause or inciting event. She has a left subclavian Port-A-Cath that was placed for chemotherapy. She completed her chemotherapy about 6-8 weeks ago and has not had the port used or flushed in about 6 weeks. She currently does not need the port and says that she is cancer free. Her left upper extremity swelling has improved only slightly with anticoagulation. Elevation has helped a little. There was no trauma or injury to the arm. She has a previous history of right upper extremity DVT about 5 years ago but does not have much in the way residual symptoms from this. Her venous duplex showed extensive left upper extremity venous thrombosis throughout the left upper extremity. We are consult for further evaluation and treatment and she was admitted and started on anticoagulation.  Current Facility-Administered Medications  Medication Dose Route Frequency Provider Last Rate Last Dose  . acetaminophen (TYLENOL) tablet 650 mg  650 mg Oral Q6H PRN Lytle Butte, MD       Or  . acetaminophen (TYLENOL) suppository 650 mg  650 mg Rectal Q6H PRN Lytle Butte, MD      . albuterol (PROVENTIL) (2.5 MG/3ML) 0.083% nebulizer solution 3 mL  3 mL Inhalation Q6H PRN Lytle Butte, MD      . ARIPiprazole (ABILIFY) tablet 2 mg  2 mg Oral Daily Lytle Butte, MD   2 mg at 10/15/14 1039  . budesonide-formoterol (SYMBICORT) 160-4.5 MCG/ACT inhaler 2 puff  2 puff Inhalation BID Lytle Butte, MD   2 puff at 10/15/14 0804  . calcium-vitamin D (OSCAL WITH D) 500-200 MG-UNIT per tablet 1 tablet  1 tablet Oral BID Lytle Butte, MD   1 tablet at  10/15/14 1039  . clonazePAM (KLONOPIN) tablet 1 mg  1 mg Oral TID PRN Lytle Butte, MD   1 mg at 10/15/14 0034  . docusate sodium (COLACE) capsule 100 mg  100 mg Oral BID Lytle Butte, MD   100 mg at 10/15/14 1039  . DULoxetine (CYMBALTA) DR capsule 60 mg  60 mg Oral Daily Lytle Butte, MD   60 mg at 10/15/14 1039  . escitalopram (LEXAPRO) tablet 20 mg  20 mg Oral Daily Lytle Butte, MD   20 mg at 10/15/14 1039  . etodolac (LODINE) tablet 500 mg  500 mg Oral BID Lytle Butte, MD   500 mg at 10/15/14 1038  . famotidine (PEPCID) tablet 20 mg  20 mg Oral BID Lytle Butte, MD   20 mg at 10/15/14 1039  . heparin ADULT infusion 100 units/mL (25000 units/250 mL)  1,300 Units/hr Intravenous Continuous Lytle Butte, MD 13 mL/hr at 10/15/14 0037 1,300 Units/hr at 10/15/14 0037  . lidocaine (XYLOCAINE) 2 % jelly 1 application  1 application Topical PRN Lytle Butte, MD      . morphine 2 MG/ML injection 2 mg  2 mg Intravenous Q4H PRN Lytle Butte, MD      . ondansetron [facility]) tablet 4 mg  4 mg Oral Q6H PRN Lytle Butte, MD       Or  .  ondansetron (ZOFRAN) injection 4 mg  4 mg Intravenous Q6H PRN Lytle Butte, MD      . oxybutynin (DITROPAN) tablet 5 mg  5 mg Oral BID Lytle Butte, MD   5 mg at 10/15/14 1039  . oxyCODONE (Oxy IR/ROXICODONE) immediate release tablet 5 mg  5 mg Oral Q4H PRN Lytle Butte, MD   5 mg at 10/15/14 2831  . polyethylene glycol (MIRALAX / GLYCOLAX) packet 17 g  17 g Oral Daily PRN Lytle Butte, MD      . senna [facility]) tablet 17.2 mg  2 tablet Oral BID PRN Lytle Butte, MD      . simvastatin (ZOCOR) tablet 40 mg  40 mg Oral QHS Lytle Butte, MD   40 mg at 10/15/14 0034  . sucralfate (CARAFATE) tablet 1 g  1 g Oral TID Lytle Butte, MD   1 g at 10/15/14 1038  . tiotropium (SPIRIVA) inhalation capsule 18 mcg  18 mcg Inhalation Daily Lytle Butte, MD   18 mcg at 10/15/14 5176  . zolpidem (AMBIEN) tablet 10 mg  10 mg Oral QHS PRN Lytle Butte, MD   10 mg at 10/15/14  0034   Facility-Administered Medications Ordered in Other Encounters  Medication Dose Route Frequency Provider Last Rate Last Dose  . heparin lock flush 100 unit/mL  500 Units Intravenous Once Lequita Asal, MD      . sodium chloride 0.9 % injection 10 mL  10 mL Intravenous PRN Lequita Asal, MD        Past Medical History  Diagnosis Date  . Schizophrenia ([place])   . Asthma   . GERD (gastroesophageal reflux disease)   . Anxiety   . Depression   . Bipolar disorder ([place])   . COPD (chronic obstructive pulmonary disease) ([place])   . Occasional tremors     right hand  . PTSD (post-traumatic stress disorder)   . Shortness of breath dyspnea   . Fibromyalgia   . DVT (deep venous thrombosis) ([place]) 2011    RUE  . Thyroid nodule   . DDD (degenerative disc disease), lumbar   . Spinal stenosis   . Peripheral neuropathy ([place])   . Rotator cuff tear     right  . Pneumonia 2011  . Hypothyroidism     no meds currently  . Anemia     during pregnancy only  . Hypertension     Off meds x 15 years-well controlled now per pt  . Squamous cell cancer, anus (HCC)   . Severe obstructive sleep apnea 06/27/2014  . DVT of upper extremity (deep vein thrombosis) ([place]) 10/14/2014    Past Surgical History  Procedure Laterality Date  . Foot surgery Right   . Tubal ligation    . Eye surgery Bilateral   . Mouth surgery  2002  . Rectal biopsy N/A 05/08/2014    Procedure: BIOPSY RECTAL;  Surgeon: Marlyce Huge, MD;  Location: [facility] ORS;  Service: General;  Laterality: N/A;  . Evaluation under anesthesia with hemorrhoidectomy N/A 05/08/2014    Procedure: EXAM UNDER ANESTHESIA WITH HEMORRHOIDECTOMY;  Surgeon: Marlyce Huge, MD;  Location: [facility] ORS;  Service: General;  Laterality: N/A;  . Portacath placement N/A 05/20/2014    Procedure: INSERTION PORT-A-CATH;  Surgeon: Florene Glen, MD;  Location: [facility] ORS;  Service: General;  Laterality: N/A;  . Laparoscopic diverted colostomy N/A  05/26/2014    Procedure: LAPAROSCOPIC DIVERTED COLOSTOMY;  Surgeon: Marlyce Huge, MD;  Location: [facility] ORS;  Service: General;  Laterality: N/A;    Social History Social History  Substance Use Topics  . Smoking status: Current Every Day Smoker -- 0.25 packs/day for 44 years    Types: Cigarettes    Start date: 10/04/1970  . Smokeless tobacco: Never Used  . Alcohol Use: No     Comment: 1 drink every 2-3 times/year    Family History Family History  Problem Relation Age of Onset  . Cancer Maternal Aunt   . Cancer Paternal Uncle   . Diabetes Mother   . Thyroid disease Mother   . Kidney failure Mother   . Hypertension Mother   . Depression Mother     Allergies  Allergen Reactions  . Sulfa Antibiotics Hives     REVIEW OF SYSTEMS (Negative unless checked)  Constitutional: '[]'$ Weight loss  '[]'$ Fever  '[]'$ Chills Cardiac: '[]'$ Chest pain   '[]'$ Chest pressure   '[]'$ Palpitations   '[]'$ Shortness of breath when laying flat   '[]'$ Shortness of breath at rest   '[]'$ Shortness of breath with exertion. Vascular:  '[]'$ Pain in legs with walking   '[]'$ Pain in legs at rest   '[]'$ Pain in legs when laying flat   '[]'$ Claudication   '[]'$ Pain in feet when walking  '[]'$ Pain in feet at rest  '[]'$ Pain in feet when laying flat   '[x]'$ History of DVT   '[x]'$ Phlebitis   '[]'$ Swelling in legs   '[]'$ Varicose veins   '[]'$ Non-healing ulcers Pulmonary:   '[]'$ Uses home oxygen   '[]'$ Productive cough   '[]'$ Hemoptysis   '[]'$ Wheeze  '[]'$ COPD   '[]'$ Asthma Neurologic:  '[]'$ Dizziness  '[]'$ Blackouts   '[]'$ Seizures   '[]'$ History of stroke   '[]'$ History of TIA  '[]'$ Aphasia   '[]'$ Temporary blindness   '[]'$ Dysphagia   '[]'$ Weakness or numbness in arms   '[]'$ Weakness or numbness in legs Musculoskeletal:  '[]'$ Arthritis   '[]'$ Joint swelling   '[]'$ Joint pain   '[]'$ Low back pain Hematologic:  '[]'$ Easy bruising  '[]'$ Easy bleeding   '[]'$ Hypercoagulable state   '[]'$ Anemic  '[]'$ Hepatitis Gastrointestinal:  '[]'$ Blood in stool   '[]'$ Vomiting blood  '[]'$ Gastroesophageal reflux/heartburn   '[]'$ Difficulty swallowing. Genitourinary:   '[]'$ Chronic kidney disease   '[]'$ Difficult urination  '[]'$ Frequent urination  '[]'$ Burning with urination   '[]'$ Blood in urine Skin:  '[]'$ Rashes   '[]'$ Ulcers   '[]'$ Wounds Psychological:  '[]'$ History of anxiety   '[]'$  History of major depression.  Physical Examination  Filed Vitals:   10/15/14 0000 10/15/14 0026 10/15/14 0502 10/15/14 0957  BP: 116/67 130/55 88/38 112/54  Pulse: 80 77 87 75  Temp:  98.2 F (36.8 C) 98.2 F (36.8 C)   TempSrc:  Oral Oral   Resp:  18 18   Height:  '5\' 5"'$  (1.651 m)    Weight:  88.179 kg (194 lb 6.4 oz)    SpO2: 96% 93% 95%    Body mass index is 32.35 kg/(m^2). Gen:  WD/WN, NAD Head: /AT, No temporalis wasting. Prominent temp pulse not noted. Ear/Nose/Throat: Hearing grossly intact, nares w/o erythema or drainage, oropharynx w/o Erythema/Exudate Eyes: PERRLA, EOMI.  Neck: Supple, no nuchal rigidity.  No bruit or JVD.  Pulmonary:  Good air movement, clear to auscultation bilaterally.  Cardiac: RRR, normal S1, S2, no Murmurs, rubs or gallops. Vascular:  Vessel Right Left  Radial Palpable Palpable                                   Gastrointestinal: soft, non-tender/non-distended. No guarding/reflex. No masses, surgical incisions, or scars.  Musculoskeletal: M/S 5/5 throughout.  Extremities without ischemic changes.  No deformity or atrophy. RUe with no edema.  LUE with 2-3+ edema Neurologic: CN 2-12 intact. Pain and light touch intact in extremities.  Symmetrical.  Speech is fluent. Motor exam as listed above. Psychiatric: Judgment intact, Mood & affect appropriate for pt's clinical situation. Dermatologic: No rashes or ulcers noted.  No cellulitis or open wounds. Lymph : No Cervical, Axillary, or Inguinal lymphadenopathy.       CBC Lab Results  Component Value Date   WBC 5.9 10/15/2014   HGB 11.5* 10/15/2014   HCT 34.0* 10/15/2014   MCV 102.2* 10/15/2014   PLT 133* 10/15/2014    BMET    Component Value Date/Time   NA 140 10/15/2014 0623   NA 142  12/25/2013 1858   K 3.5 10/15/2014 0623   K 3.5 12/25/2013 1858   CL 108 10/15/2014 0623   CL 107 12/25/2013 1858   CO2 27 10/15/2014 0623   CO2 29 12/25/2013 1858   GLUCOSE 100* 10/15/2014 0623   GLUCOSE 88 12/25/2013 1858   BUN 6 10/15/2014 0623   BUN 11 12/25/2013 1858   CREATININE 0.57 10/15/2014 0623   CREATININE 0.80 12/25/2013 1858   CALCIUM 8.5* 10/15/2014 0623   CALCIUM 8.1* 12/25/2013 1858   GFRNONAA >60 10/15/2014 0623   GFRNONAA >60 12/25/2013 1858   GFRNONAA >60 09/15/2013 1430   GFRAA >60 10/15/2014 0623   GFRAA >60 12/25/2013 1858   GFRAA >60 09/15/2013 1430   Estimated Creatinine Clearance: 84.1 mL/min (by C-G formula based on Cr of 0.57).  COAG Lab Results  Component Value Date   INR 1.03 10/14/2014    Radiology US Venous Img Upper Uni Left  10/14/2014   CLINICAL DATA:  Patient with left arm pain and swelling.  EXAM: LEFT UPPER EXTREMITY VENOUS DOPPLER ULTRASOUND  TECHNIQUE: Gray-scale sonography with graded compression, as well as color Doppler and duplex ultrasound were performed to evaluate the upper extremity deep venous system from the level of the subclavian vein and including the jugular, axillary, basilic, radial, ulnar and upper cephalic vein. Spectral Doppler was utilized to evaluate flow at rest and with distal augmentation maneuvers.  COMPARISON:  None.  FINDINGS: There is acute thrombus demonstrated within the left internal jugular, subclavian and axillary veins extending into 1 of the brachial veins proximally. Thrombus is also demonstrated within the proximal aspect of the cephalic and basilic veins. Catheter is demonstrated within subclavian vein.  IMPRESSION: Acute DVT within the left upper extremity.  Critical Value/emergent results were called by telephone at the time of interpretation on 10/14/2014 at 9:34 pm to Dr. Lenise Arena , who verbally acknowledged these results.   Electronically Signed   By: Lovey Newcomer M.D.   On: 10/14/2014 21:38      Assessment/Plan 1. Extensive left upper extremity DVT. In association with a Port-A-Cath and previous malignancy likely causing hypercoagulable state. Has already had a right upper extremity DVT although not nearly as extensive. Discussed with her options for treatment. Agree completely with anticoagulation and would continue that therapy. Venous thrombolysis and thrombectomy would also be a reasonable option due to the extensive nature of the clot and the significant symptoms. In addition, I would recommend port removal. She is no longer using her port and has completed chemotherapy. She agrees with our plan of care and this will be scheduled for tomorrow. 2. Breast cancer. Stable. No evidence of disease. No longer using Port-A-Cath. 3. Previous right upper extremity DVT.  Only mild symptoms currently. 4. Tobacco dependence. May increase thrombotic risk. Cessation recommended   Seamus Warehime, MD  10/15/2014 10:47 AM

## 2014-10-16 ENCOUNTER — Telehealth: Payer: Self-pay | Admitting: *Deleted

## 2014-10-16 ENCOUNTER — Inpatient Hospital Stay: Payer: Commercial Managed Care - HMO

## 2014-10-16 ENCOUNTER — Encounter: Payer: Commercial Managed Care - HMO | Admitting: General Surgery

## 2014-10-16 ENCOUNTER — Encounter
Admission: EM | Disposition: A | Discharge status: Home / Self Care | Payer: Self-pay | Source: Home / Self Care | Attending: Emergency Medicine

## 2014-10-16 DIAGNOSIS — I82622 Acute embolism and thrombosis of deep veins of left upper extremity: Secondary | ICD-10-CM | POA: Diagnosis not present

## 2014-10-16 HISTORY — PX: PERIPHERAL VASCULAR CATHETERIZATION: SHX172C

## 2014-10-16 LAB — BASIC METABOLIC PANEL
Anion gap: 5 (ref 5–15)
BUN: 9 mg/dL (ref 6–20)
CALCIUM: 8.5 mg/dL — AB (ref 8.9–10.3)
CO2: 28 mmol/L (ref 22–32)
CREATININE: 0.72 mg/dL (ref 0.44–1.00)
Chloride: 107 mmol/L (ref 101–111)
GFR calc non Af Amer: >60 mL/min (ref >60)
GLUCOSE: 91 mg/dL (ref 65–99)
Potassium: 3.9 mmol/L (ref 3.5–5.1)
Sodium: 140 mmol/L (ref 135–145)

## 2014-10-16 LAB — CBC
HEMATOCRIT: 33.9 % — AB (ref 35.0–47.0)
Hemoglobin: 11.2 g/dL — ABNORMAL LOW (ref 12.0–16.0)
MCH: 34.2 pg — ABNORMAL HIGH (ref 26.0–34.0)
MCHC: 33 g/dL (ref 32.0–36.0)
MCV: 103.5 fL — ABNORMAL HIGH (ref 80.0–100.0)
Platelets: 133 10*3/uL — ABNORMAL LOW (ref 150–440)
RBC: 3.28 MIL/uL — ABNORMAL LOW (ref 3.80–5.20)
RDW: 15.3 % — AB (ref 11.5–14.5)
WBC: 5 10*3/uL (ref 3.6–11.0)

## 2014-10-16 LAB — HEPARIN LEVEL (UNFRACTIONATED): Heparin Unfractionated: 0.45 IU/mL (ref 0.30–0.70)

## 2014-10-16 SURGERY — UPPER EXTREMITY VENOGRAPHY
Anesthesia: Moderate Sedation | Laterality: Left

## 2014-10-16 MED ORDER — FENTANYL CITRATE (PF) 100 MCG/2ML IJ SOLN
INTRAMUSCULAR | Status: DC | PRN
  Administered 2014-10-16 (×3): 25 ug via INTRAVENOUS
  Administered 2014-10-16 (×2): 50 ug via INTRAVENOUS

## 2014-10-16 MED ORDER — LIDOCAINE-EPINEPHRINE (PF) 1 %-1:200000 IJ SOLN
INTRAMUSCULAR | Status: AC
  Filled 2014-10-16: qty 30

## 2014-10-16 MED ORDER — DEXTROSE 5 % IV SOLN
INTRAVENOUS | Status: AC
  Filled 2014-10-16: qty 1.5

## 2014-10-16 MED ORDER — HEPARIN (PORCINE) IN NACL 2-0.9 UNIT/ML-% IJ SOLN
INTRAMUSCULAR | Status: AC
  Filled 2014-10-16: qty 1000

## 2014-10-16 MED ORDER — MIDAZOLAM HCL 2 MG/2ML IJ SOLN
INTRAMUSCULAR | Status: DC | PRN
  Administered 2014-10-16 (×2): 1 mg via INTRAVENOUS
  Administered 2014-10-16: 2 mg via INTRAVENOUS
  Administered 2014-10-16: 1 mg via INTRAVENOUS
  Administered 2014-10-16: 2 mg via INTRAVENOUS

## 2014-10-16 MED ORDER — MIDAZOLAM HCL 5 MG/5ML IJ SOLN
INTRAMUSCULAR | Status: AC
  Filled 2014-10-16: qty 5

## 2014-10-16 MED ORDER — HEPARIN SODIUM (PORCINE) 1000 UNIT/ML IJ SOLN
INTRAMUSCULAR | Status: AC
  Filled 2014-10-16: qty 1

## 2014-10-16 MED ORDER — LIDOCAINE HCL (PF) 1 % IJ SOLN
INTRAMUSCULAR | Status: AC
  Filled 2014-10-16: qty 20

## 2014-10-16 MED ORDER — FENTANYL CITRATE (PF) 100 MCG/2ML IJ SOLN
INTRAMUSCULAR | Status: AC
  Filled 2014-10-16: qty 2

## 2014-10-16 MED ORDER — ALTEPLASE 2 MG IJ SOLR
INTRAMUSCULAR | Status: AC
  Filled 2014-10-16: qty 8

## 2014-10-16 MED ORDER — HEPARIN SODIUM (PORCINE) 1000 UNIT/ML IJ SOLN
INTRAMUSCULAR | Status: DC | PRN
  Administered 2014-10-16: 4000 [IU] via INTRAVENOUS

## 2014-10-16 MED ORDER — SODIUM CHLORIDE 0.9 % IV SOLN
INTRAVENOUS | Status: DC
  Administered 2014-10-16: 15:00:00 via INTRAVENOUS

## 2014-10-16 MED ORDER — LIDOCAINE-EPINEPHRINE (PF) 1 %-1:200000 IJ SOLN
INTRAMUSCULAR | Status: AC
Start: 2014-10-16 — End: 2014-10-16
  Filled 2014-10-16: qty 30

## 2014-10-16 MED ORDER — HEPARIN (PORCINE) IN NACL 2-0.9 UNIT/ML-% IJ SOLN
INTRAMUSCULAR | Status: AC
  Filled 2014-10-16: qty 500

## 2014-10-16 SURGICAL SUPPLY — 20 items
BALLN ARMADA 10X80X135 (BALLOONS) ×3
BALLN ARMADA 12X40X80 (BALLOONS) ×3
BALLN ULTRVRSE 6X200X130 (BALLOONS) ×3
BALLN ULTRVRSE 8X200X130 (BALLOONS) ×3
BALLOON ARMADA 10X80X135 (BALLOONS) ×2 IMPLANT
BALLOON ARMADA 12X40X80 (BALLOONS) ×2 IMPLANT
BALLOON ULTRVRSE 6X200X130 (BALLOONS) ×2 IMPLANT
BALLOON ULTRVRSE 8X200X130 (BALLOONS) ×2 IMPLANT
CANNULA 5F STIFF (CANNULA) ×3 IMPLANT
CATH 5F KA2 (CATHETERS) ×3 IMPLANT
CATH KUMPE (CATHETERS) ×1
CATH SLIP 5FR 0.38 X 40 KMP (CATHETERS) ×2 IMPLANT
DEVICE PRESTO INFLATION (MISCELLANEOUS) ×3 IMPLANT
DEVICE SOLENT PROXI 90CM (CATHETERS) ×3 IMPLANT
DRAPE BRACHIAL (DRAPES) ×3 IMPLANT
GLIDEWIRE ADV .035X260CM (WIRE) ×3 IMPLANT
PACK ANGIOGRAPHY (CUSTOM PROCEDURE TRAY) ×3 IMPLANT
SHEATH BRITE TIP 6FRX5.5 (SHEATH) ×3 IMPLANT
SUT MNCRL AB 4-0 PS2 18 (SUTURE) ×3 IMPLANT
TOWEL OR 17X26 4PK STRL BLUE (TOWEL DISPOSABLE) ×3 IMPLANT

## 2014-10-16 NOTE — Op Note (Signed)
[facility]   OPERATIVE NOTE   PRE-OPERATIVE DIAGNOSIS: extensive LUE DVT associated with left subclavian port  POST-OPERATIVE DIAGNOSIS: same   PROCEDURE: 1. US guidance for vascular access to the left brachial vein and 2. Catheter placement into SVC from the left brachial vein  3. SVC gram and LUE venogram 4. Removal of left subclavian port 5.   Catheter directed thrombolysis with 8 mg of tpa to the left brachial, axillary, subclavian, and innominate veins delivered with the Angiojet proxy catheter in a power pulse spray fashion 6. Mechanical rheolytic thrombectomy to left brachial, axillary, subclavian, and innominate veins with the Angiojet proxy catheter 7. PTA of left brachial and axillary veins with 6 mm balloon 8. PTA of left axillary and subclavian veins with 8 mm and with 10 mm balloons 9.   PTA of left innominate vein with 8, 10, and 12 mm balloons   SURGEON: Jahmar Mckelvy, MD  ASSISTANT(S): none  ANESTHESIA: local/MCS  ESTIMATED BLOOD LOSS: minimal  FINDING(S): 1. extensive LUE DVT, central venous stenosis from port and thrombus  CONTRAST: 45 cc  FLUORO TIME: 7.1 minutes  SPECIMEN(S): none  INDICATIONS:  Patient is a 59 y.o. female who presents with extensive LUE DVT and a port in the left subclavian vein. Patient has marked left arm swelling and pain. Venous intervention is performed to reduce the symtpoms and avoid long term postphlebitic symptoms.   DESCRIPTION: After obtaining full informed written consent, the patient was brought back to the vascular suite and placed supine upon the table. The patient received IV antibiotics prior to induction. After obtaining adequate anesthesia, the patient was prepped and draped in the standard fashion. The left brachial vein was then accessed under direct ultrasound guidance without difficulty with a micropuncture needle and a permanent image was recorded. I then upsized to an  6 Fr sheath over a J wire.I then anesthetized the previous left subclavicular incision copiously with 1% lidocaine. The previous incision was reopened, exposing the junction of the catheter to the previous port. The port was then dissected out in its entirety and the suture securing it to the subcutaneous tissue were removed. The port and the catheter were then removed in their entirety. I closed the incision with a 3-0 Vicryl and a 4-0 Monocryl.  4000 units of heparin were then given. Imaging showed extensive DVT with minimal flow. A Kumpe catheter and Advantage wire were then advanced into the left subclavian and innominate veins and images were performed. Stenosis and thrombus was seen centrally with a completely occlusive situation seen. I was able to cross the thrombus and stenosis and advance into the SVC which was patent. I then used the Angiojet proxy catheter and instilled 8 mg of tpa throughout the left brachial, axillary, subclavian, and innominate veins.  After this dwelled for 15 minutes, I used the Angiojet proxy catheter and evacuated about 144 cc of effluent with mechanical rheolytic thrombectomy throughout the brachial, axillary, subclavian, and innominate veins. This had mild improvement. I then treated the brachial and axillary veins with 6 mm diameter x 20 cm length angioplasty balloon inflated to 10 atm for 1 minute to open a channel through the thrombus. This resulted in significant resolution of the thrombus and improved flow in the brachial and axillary veins. I then turned my attention to the central veins. The stenosis/occlusion and thrombus was treated with a 8 mm diameter x 20 cm length angioplasty balloon. This was inflated to 10 atm for 1 minute. Some improvement  was seen, but the 8 mm balloon was undersized in the innominate and subclavian veins. I then performed percutaneous transluminal angioplasty with 10 mm balloons inflated to 8 atm in the innominate, subclavian, and  back to the proximal axillary vein. Significant improvement was seen, within the innominate vein narrowing and thrombus was seen and I elected to upsized to a 12 mm diameter angioplasty balloon. This was inflated to 8 atm for 1 minute. Mild to moderate residual stenosis was still seen centrally in the subclavian and innominate veins, but markedly improved flow and a significant channel of blood was seen and I felt we had improved her as much as possible. I then elected to terminate the procedure. The sheath was removed and a dressing was placed. She was taken to the recovery room in stable condition having tolerated the procedure well.   COMPLICATIONS: None  CONDITION: Stable  Avel Ogawa 10/16/2014 4:38 PM

## 2014-10-16 NOTE — Plan of Care (Addendum)
Problem: Discharge Progression Outcomes Goal: Other Discharge Outcomes/Goals Outcome: Progressing Patient given pain med x 1 with noted relief. VSS. Remains on heparin drip. Thrombectomy and port removal performed.  Patient takes care of colostomy without assistance. NPO for procedures.

## 2014-10-16 NOTE — Plan of Care (Signed)
Problem: Discharge Progression Outcomes Goal: Other Discharge Outcomes/Goals Plan of care progress to goal: Pain: morphine given x2 for arm pain with relief Hemodynamically: VSS Diet: NPO for PAC removal and thrombectomy today Activity: independent

## 2014-10-16 NOTE — Telephone Encounter (Signed)
  We will see her back after discharge.  M

## 2014-10-16 NOTE — Care Management (Signed)
Admitted to this facility with the diagnosis of DVT. Granddaughter lives with her in a boarding house for the last 18 months. Would like to move, if possible.  Daughter Stanton Kidney lives across the hall. Call daughter April for emergencies [phone]). There are 16 other residents in this facility. Last seen Dr. Humphrey Rolls 3 months ago, needs to make another appointment with Dr. Humphrey Rolls. Home Health thru [facility] in the past. (May)  Shower chair and hospital bed in the apartment. Uses a rolling walker to aid in ambulation as needed. No skilled facility. No home oxygen. Takes care of all basic activities of daily living herself. No car to drive. Uses [employer] and friends for transportation.  Shelbie Ammons RN MSN Care Management [phone]

## 2014-10-16 NOTE — Progress Notes (Signed)
   10/16/14 1045  Clinical Encounter Type  Visited With Patient  Visit Type Initial  Attempted to visit with patient but patient appeared to be sleeping.  Will try to visit again later this afternoon.  [place] [phone]

## 2014-10-16 NOTE — Progress Notes (Signed)
[facility] [place] NAME: [name]    MR#:  [id number]  DATE OF BIRTH:  [birth year]  SUBJECTIVE:  CHIEF COMPLAINT:   Chief Complaint  Patient presents with  . Arm Swelling   - for thrombolysis, thrombectomy and port a cath removal today - remains NPO, complains of being hungry   REVIEW OF SYSTEMS:  Review of Systems  Constitutional: Negative for fever, chills and malaise/fatigue.  HENT: Negative for congestion, ear discharge and nosebleeds.   Eyes: Negative for blurred vision and double vision.  Respiratory: Negative for cough, shortness of breath and wheezing.   Cardiovascular: Negative for chest pain, palpitations and leg swelling.  Gastrointestinal: Negative for nausea, vomiting, abdominal pain, diarrhea and constipation.  Genitourinary: Negative for dysuria and frequency.  Musculoskeletal: Positive for myalgias.       Left arm swelling and pain  Neurological: Negative for dizziness, sensory change, speech change, focal weakness, seizures, weakness and headaches.  Psychiatric/Behavioral: Negative for depression. The patient is not nervous/anxious.     DRUG ALLERGIES:   Allergies  Allergen Reactions  . Sulfa Antibiotics Hives    VITALS:  Blood pressure 102/56, pulse 73, temperature 98 F (36.7 C), temperature source Oral, resp. rate 18, height '5\' 5"'$  (1.651 m), weight 88.179 kg (194 lb 6.4 oz), SpO2 96 %.  PHYSICAL EXAMINATION:  Physical Exam  GENERAL:  59 y.o.-year-old patient lying in the bed with no acute distress.  EYES: Pupils equal, round, reactive to light and accommodation. No scleral icterus. Extraocular muscles intact.  HEENT: Head atraumatic, normocephalic. Oropharynx and nasopharynx clear.  NECK:  Supple, no jugular venous distention. No thyroid enlargement, no tenderness.  LUNGS: Normal breath sounds bilaterally, no wheezing, rales,rhonchi or crepitation. No use of accessory muscles of respiration.   CARDIOVASCULAR: S1, S2 normal. No murmurs, rubs, or gallops.  ABDOMEN: Soft, nontender, nondistended. Bowel sounds present. No organomegaly or mass.  EXTREMITIES: Left arm is discolored, swollen and tender to touch.  No pedal edema, cyanosis, or clubbing.  NEUROLOGIC: Cranial nerves II through XII are intact. Muscle strength 5/5 in all extremities. Sensation intact. Gait not checked.  PSYCHIATRIC: The patient is alert and oriented x 3.  SKIN: No obvious rash, lesion, or ulcer.    LABORATORY PANEL:   CBC  Recent Labs Lab 10/16/14 0414  WBC 5.0  HGB 11.2*  HCT 33.9*  PLT 133*   ------------------------------------------------------------------------------------------------------------------  Chemistries   Recent Labs Lab 10/14/14 1945  10/16/14 0414  NA 139  < > 140  K 3.5  < > 3.9  CL 105  < > 107  CO2 27  < > 28  GLUCOSE 101*  < > 91  BUN 6  < > 9  CREATININE 0.58  < > 0.72  CALCIUM 8.6*  < > 8.5*  AST 23  --   --   ALT 9*  --   --   ALKPHOS 60  --   --   BILITOT 0.5  --   --   < > = values in this interval not displayed. ------------------------------------------------------------------------------------------------------------------  Cardiac Enzymes No results for input(s): TROPONINI in the last 168 hours. ------------------------------------------------------------------------------------------------------------------  RADIOLOGY:  US Venous Img Upper Uni Left  10/14/2014   CLINICAL DATA:  Patient with left arm pain and swelling.  EXAM: LEFT UPPER EXTREMITY VENOUS DOPPLER ULTRASOUND  TECHNIQUE: Gray-scale sonography with graded compression, as well as color Doppler and duplex ultrasound were performed to evaluate the upper extremity deep venous  system from the level of the subclavian vein and including the jugular, axillary, basilic, radial, ulnar and upper cephalic vein. Spectral Doppler was utilized to evaluate flow at rest and with distal augmentation  maneuvers.  COMPARISON:  None.  FINDINGS: There is acute thrombus demonstrated within the left internal jugular, subclavian and axillary veins extending into 1 of the brachial veins proximally. Thrombus is also demonstrated within the proximal aspect of the cephalic and basilic veins. Catheter is demonstrated within subclavian vein.  IMPRESSION: Acute DVT within the left upper extremity.  Critical Value/emergent results were called by telephone at the time of interpretation on 10/14/2014 at 9:34 pm to Dr. Lenise Arena , who verbally acknowledged these results.   Electronically Signed   By: Lovey Newcomer M.D.   On: 10/14/2014 21:38    EKG:   Orders placed or performed during the hospital encounter of 08/04/14  . ED EKG  . ED EKG  . EKG    ASSESSMENT AND PLAN:    59 year old female with past medical history significant for annual rectal cancer status post chemotherapy and radiation presents with left arm DVT  #1 deep venous thrombosis-in the left arm -Secondary to Port-A-Cath related and also having malignancy (hypercoagulable state) -Continue IV heparin for now. -Appreciate vascular consult. For thrombolysis and thrombectomy, Port-A-Cath removal today - [name] be discharged on a newer anticoagulant- either Xareltor eliquis.   #2 stage II ano rectal cancer-follows with Dr. Mike Gip -Finished chemotherapy. Port-A-Cath [name] be removed today. -Still has some radiation treatments which are currently on hold due to wound care treatment of her perineal area. -Follow up with cancer Center as recommended  #3 Depression and schizophrenia-prior history of behavioral medicine admission. -Currently stable. Continue her Lexapro, Abilify, Klonopin  #4  COPD-stable. No indication for systemic steroids. -Continue her inhalers    #5 DVT prophylaxis- on IV heparin   All the records are reviewed and case discussed with Care Management/Social Workerr. Management plans discussed with the patient, family  and they are in agreement.  CODE STATUS: Full code  TOTAL TIME TAKING CARE OF THIS PATIENT: 32  minutes.   POSSIBLE D/C TOMORROW, DEPENDING ON CLINICAL CONDITION.   Yaritza Leist M.D on 10/16/2014 at 1:31 PM  Between 7am to 6pm - Pager - [phone]  After 6pm go to [email or web address] - password [email or web address] [facility]  Office  [phone]  CC: Primary care physician; Lavera Guise, MD

## 2014-10-16 NOTE — Telephone Encounter (Signed)
Called asking that her Oxycodone be refilled and that her daughter will pick up tomorrow. She also reports that she is in the hospital. I called her back and told her we will not fill it for her whilst in hospital and that it is not due to be filled without being in the hospital until 10/13, then with her being in the hospital adds a few more days that it is not due (10/15). I told her to ask the hospitalist about refilling her pain med before she goes home.

## 2014-10-16 NOTE — Progress Notes (Signed)
ANTICOAGULATION CONSULT NOTE - Initial Consult  Pharmacy Consult for heparin drip Indication: UEDVT  Allergies  Allergen Reactions  . Sulfa Antibiotics Hives    Patient Measurements: Height: '5\' 5"'$  (165.1 cm) Weight: 194 lb 6.4 oz (88.179 kg) IBW/kg (Calculated) : 57 Heparin Dosing Weight: 76kg  Vital Signs: Temp: 98 F (36.7 C) (10/09 2045) Temp Source: Oral (10/09 2045) BP: 102/56 mmHg (10/10 0512) Pulse Rate: 73 (10/10 0512)  Labs:  Recent Labs  10/14/14 1945 10/14/14 2212 10/15/14 0623 10/15/14 0624 10/15/14 1234 10/16/14 0414  HGB 12.4  --  11.5*  --   --  11.2*  HCT 36.6  --  34.0*  --   --  33.9*  PLT 133*  --  133*  --   --  133*  APTT  --  35  --   --   --   --   LABPROT  --  13.7  --   --   --   --   INR  --  1.03  --   --   --   --   HEPARINUNFRC  --   --   --  0.33 0.54 0.45  CREATININE 0.58  --  0.57  --   --  0.72    Estimated Creatinine Clearance: 84.1 mL/min (by C-G formula based on Cr of 0.72).   Medical History: Past Medical History  Diagnosis Date  . Schizophrenia ([place])   . Asthma   . GERD (gastroesophageal reflux disease)   . Anxiety   . Depression   . Bipolar disorder ([place])   . COPD (chronic obstructive pulmonary disease) ([place])   . Occasional tremors     right hand  . PTSD (post-traumatic stress disorder)   . Shortness of breath dyspnea   . Fibromyalgia   . DVT (deep venous thrombosis) ([place]) 2011    RUE  . Thyroid nodule   . DDD (degenerative disc disease), lumbar   . Spinal stenosis   . Peripheral neuropathy ([place])   . Rotator cuff tear     right  . Pneumonia 2011  . Hypothyroidism     no meds currently  . Anemia     during pregnancy only  . Hypertension     Off meds x 15 years-well controlled now per pt  . Squamous cell cancer, anus (HCC)   . Severe obstructive sleep apnea 06/27/2014  . DVT of upper extremity (deep vein thrombosis) ([place]) 10/14/2014    Medications:    Assessment: 59 y/o F with extensive LUE DVT.    Goal of Therapy:  Heparin level 0.3-0.7 units/ml Monitor platelets by anticoagulation protocol: Yes   Plan:  Heparin level remains at goal so will continue heparin drip at  1300 unis/hr and f/u am labs.   10/10 AM heparin level 0.45. Continue current regimen. Recheck heparin level and CBC in AM.  Darolyn Double S 10/16/2014,5:55 AM

## 2014-10-17 ENCOUNTER — Ambulatory Visit: Payer: Commercial Managed Care - HMO | Admitting: Surgery

## 2014-10-17 ENCOUNTER — Encounter: Payer: Self-pay | Admitting: Vascular Surgery

## 2014-10-17 ENCOUNTER — Inpatient Hospital Stay: Payer: Commercial Managed Care - HMO

## 2014-10-17 ENCOUNTER — Encounter: Payer: Commercial Managed Care - HMO | Admitting: Surgery

## 2014-10-17 ENCOUNTER — Other Ambulatory Visit: Payer: Self-pay

## 2014-10-17 DIAGNOSIS — I82622 Acute embolism and thrombosis of deep veins of left upper extremity: Secondary | ICD-10-CM | POA: Diagnosis not present

## 2014-10-17 LAB — CBC
HEMATOCRIT: 32.7 % — AB (ref 35.0–47.0)
HEMOGLOBIN: 10.8 g/dL — AB (ref 12.0–16.0)
MCH: 34.1 pg — AB (ref 26.0–34.0)
MCHC: 33.1 g/dL (ref 32.0–36.0)
MCV: 103.2 fL — ABNORMAL HIGH (ref 80.0–100.0)
Platelets: 129 10*3/uL — ABNORMAL LOW (ref 150–440)
RBC: 3.17 MIL/uL — ABNORMAL LOW (ref 3.80–5.20)
RDW: 15.3 % — AB (ref 11.5–14.5)
WBC: 6.6 10*3/uL (ref 3.6–11.0)

## 2014-10-17 LAB — HEPARIN LEVEL (UNFRACTIONATED): Heparin Unfractionated: 0.48 IU/mL (ref 0.30–0.70)

## 2014-10-17 MED ORDER — OXYCODONE-ACETAMINOPHEN 10-325 MG PO TABS: 1.0000 | ORAL_TABLET | Freq: Four times a day (QID) | ORAL | Status: DC | PRN

## 2014-10-17 MED ORDER — APIXABAN 5 MG PO TABS: 10.0000 mg | ORAL_TABLET | Freq: Two times a day (BID) | ORAL | Status: DC

## 2014-10-17 MED ORDER — APIXABAN 5 MG PO TABS: 5.0000 mg | ORAL_TABLET | Freq: Two times a day (BID) | ORAL | Status: DC

## 2014-10-17 NOTE — Progress Notes (Signed)
[facility]  Daily Progress Note   Subjective: 1 Day Post-Op: Removal of Subclavian port, thrombolysis with '8mg'$  of TPA to the left brachial, axillary, subclavian, and innominate veins delivered with the Angiojet, Mechanical rheolytic thrombectomy to left brachial, axillary, subclavian, and innominate veins with the Angiojet with angioplasty.   Patient with some incisional and left upper extremity discomfort today. Primary team to discharge patient home today.   Objective: Filed Vitals:   10/16/14 1636 10/16/14 1741 10/16/14 2121 10/17/14 0443  BP: 137/77 141/67 121/49 116/56  Pulse: 65 67 81 81  Temp:  97.9 F (36.6 C) 97.8 F (36.6 C) 97.8 F (36.6 C)  TempSrc:  Oral Oral Oral  Resp: '16  18 18  '$ Height:      Weight:      SpO2: 95% 95% 96% 94%    Intake/Output Summary (Last 24 hours) at 10/17/14 0853 Last data filed at 10/16/14 1225  Gross per 24 hour  Intake      0 ml  Output      2 ml  Net     -2 ml    Physical Exam: A&Ox3, NAD CV: RRR Left Chest: Incision clean, dry and intact. Dermabond intact. Pulmonary: CTA Bilaterally Abdomen: Soft, Nontender, Nondistended Left Extremity: some upper arm swelling noted. 2+ radial pulse. Fingers warm.   Laboratory: CBC    Component Value Date/Time   WBC 6.6 10/17/2014 0528   WBC 11.8* 12/25/2013 1858   HGB 10.8* 10/17/2014 0528   HGB 14.5 12/25/2013 1858   HCT 32.7* 10/17/2014 0528   HCT 45.3 12/25/2013 1858   PLT 129* 10/17/2014 0528   PLT 256 12/25/2013 1858   BMET    Component Value Date/Time   NA 140 10/16/2014 0414   NA 142 12/25/2013 1858   K 3.9 10/16/2014 0414   K 3.5 12/25/2013 1858   CL 107 10/16/2014 0414   CL 107 12/25/2013 1858   CO2 28 10/16/2014 0414   CO2 29 12/25/2013 1858   GLUCOSE 91 10/16/2014 0414   GLUCOSE 88 12/25/2013 1858   BUN 9 10/16/2014 0414   BUN 11 12/25/2013 1858   CREATININE 0.72 10/16/2014 0414   CREATININE 0.80 12/25/2013 1858   CALCIUM 8.5* 10/16/2014  0414   CALCIUM 8.1* 12/25/2013 1858   GFRNONAA >60 10/16/2014 0414   GFRNONAA >60 12/25/2013 1858   GFRNONAA >60 09/15/2013 1430   GFRAA >60 10/16/2014 0414   GFRAA >60 12/25/2013 1858   GFRAA >60 09/15/2013 1430   Assessment/Planning: 59 year old female s/p removal of Subclavian port, thrombolysis with '8mg'$  of TPA to the left brachial, axillary, subclavian, and innominate veins delivered with the Angiojet, Mechanical rheolytic thrombectomy to left brachial, axillary, subclavian, and innominate veins with the Angiojet with angioplasty. Doing well. Primary team discharging patient home today. 1) Patient to follow up in our office in 3-4 weeks with left upper extremity venous duplex.  Marcelle Overlie PA-C 10/17/2014 8:53 AM

## 2014-10-17 NOTE — Progress Notes (Signed)
ANTICOAGULATION CONSULT NOTE - Initial Consult  Pharmacy Consult for heparin drip Indication: UEDVT  Allergies  Allergen Reactions  . Sulfa Antibiotics Hives    Patient Measurements: Height: '5\' 5"'$  (165.1 cm) Weight: 194 lb (87.998 kg) IBW/kg (Calculated) : 57 Heparin Dosing Weight: 76kg  Vital Signs: Temp: 97.8 F (36.6 C) (10/11 0443) Temp Source: Oral (10/11 0443) BP: 116/56 mmHg (10/11 0443) Pulse Rate: 81 (10/11 0443)  Labs:  Recent Labs  10/14/14 1945 10/14/14 2212 10/15/14 0623  10/15/14 1234 10/16/14 0414 10/17/14 0528  HGB 12.4  --  11.5*  --   --  11.2* 10.8*  HCT 36.6  --  34.0*  --   --  33.9* 32.7*  PLT 133*  --  133*  --   --  133* 129*  APTT  --  35  --   --   --   --   --   LABPROT  --  13.7  --   --   --   --   --   INR  --  1.03  --   --   --   --   --   HEPARINUNFRC  --   --   --   < > 0.54 0.45 0.48  CREATININE 0.58  --  0.57  --   --  0.72  --   < > = values in this interval not displayed.  Estimated Creatinine Clearance: 84 mL/min (by C-G formula based on Cr of 0.72).   Medical History: Past Medical History  Diagnosis Date  . Schizophrenia ([place])   . Asthma   . GERD (gastroesophageal reflux disease)   . Anxiety   . Depression   . Bipolar disorder ([place])   . COPD (chronic obstructive pulmonary disease) ([place])   . Occasional tremors     right hand  . PTSD (post-traumatic stress disorder)   . Shortness of breath dyspnea   . Fibromyalgia   . DVT (deep venous thrombosis) ([place]) 2011    RUE  . Thyroid nodule   . DDD (degenerative disc disease), lumbar   . Spinal stenosis   . Peripheral neuropathy ([place])   . Rotator cuff tear     right  . Pneumonia 2011  . Hypothyroidism     no meds currently  . Anemia     during pregnancy only  . Hypertension     Off meds x 15 years-well controlled now per pt  . Squamous cell cancer, anus (HCC)   . Severe obstructive sleep apnea 06/27/2014  . DVT of upper extremity (deep vein thrombosis) ([place])  10/14/2014    Medications:    Assessment: 59 y/o F with extensive LUE DVT.   Goal of Therapy:  Heparin level 0.3-0.7 units/ml Monitor platelets by anticoagulation protocol: Yes   Plan:  Heparin level remains at goal so will continue heparin drip at  1300 unis/hr and f/u am labs.   10/10 AM heparin level 0.45. Continue current regimen. Recheck heparin level and CBC in AM.  1011 AM heparin level therapeutic. Continue current regimen and follow up heparin level AM.   Laural Benes, Pharm.D. Clinical Pharmacist 10/17/2014,6:25 AM

## 2014-10-17 NOTE — Plan of Care (Signed)
Problem: Discharge Progression Outcomes Goal: Other Discharge Outcomes/Goals Outcome: Progressing Plan of care progress to goal: Pt has occasional arm pain that is well controlled with morphine.  Heparin still infusing at 50m/hr Pt changes colostomy herself/changed bag this shift VSS

## 2014-10-17 NOTE — Discharge Summary (Signed)
[facility] NAME: [name]    MR#:  [id number]  DATE OF BIRTH:  1955/12/05  DATE OF ADMISSION:  10/14/2014 ADMITTING PHYSICIAN: Lytle Butte, MD  DATE OF DISCHARGE: 10/17/2014  PRIMARY CARE PHYSICIAN: Lavera Guise, MD    ADMISSION DIAGNOSIS:  Localized edema [R60.0] DVT (deep venous thrombosis), left [I82.402]  DISCHARGE DIAGNOSIS:  Principal Problem:   DVT of upper extremity (deep vein thrombosis) ([place])   SECONDARY DIAGNOSIS:   Past Medical History  Diagnosis Date  . Schizophrenia ([place])   . Asthma   . GERD (gastroesophageal reflux disease)   . Anxiety   . Depression   . Bipolar disorder ([place])   . COPD (chronic obstructive pulmonary disease) ([place])   . Occasional tremors     right hand  . PTSD (post-traumatic stress disorder)   . Shortness of breath dyspnea   . Fibromyalgia   . DVT (deep venous thrombosis) ([place]) 2011    RUE  . Thyroid nodule   . DDD (degenerative disc disease), lumbar   . Spinal stenosis   . Peripheral neuropathy ([place])   . Rotator cuff tear     right  . Pneumonia 2011  . Hypothyroidism     no meds currently  . Anemia     during pregnancy only  . Hypertension     Off meds x 15 years-well controlled now per pt  . Squamous cell cancer, anus ([facility])   . Severe obstructive sleep apnea 06/27/2014  . DVT of upper extremity (deep vein thrombosis) ([place]) 10/14/2014    HOSPITAL COURSE:    59 year old female with past medical history significant for annual rectal cancer status post chemotherapy and radiation presents with left arm DVT  #1 Deep venous thrombosis-in the left arm -Secondary to Port-A-Cath related and also having malignancy (hypercoagulable state) -was on IV heparin in the hospital, changed to eliquis at discharge -Appreciate vascular consult. Patient had thrombolysis and thrombectomy, Port-A-Cath removal done 10/16/14 - discharge today and outpatient hematology f/u -  pain meds prn  #2 stage II ano rectal cancer-follows with Dr. Mike Gip -Finished chemotherapy. Port-A-Cath  removed. -Still has some radiation treatments which are currently on hold due to wound care treatment of her perineal area. -Follow up with cancer Center as recommended  #3 Depression and schizophrenia-prior history of behavioral medicine admission. -Currently stable. Continue her Lexapro, Abilify, Klonopin  #4 COPD-stable. No indication for systemic steroids. -Continue her inhalers   Discharge today  DISCHARGE CONDITIONS:   Stable  CONSULTS OBTAINED:  Treatment Team:  Lytle Butte, MD Algernon Huxley, MD  DRUG ALLERGIES:   Allergies  Allergen Reactions  . Sulfa Antibiotics Hives    DISCHARGE MEDICATIONS:   Current Discharge Medication List    START taking these medications   Details  !! apixaban (ELIQUIS) 5 MG TABS tablet Take 2 tablets (10 mg total) by mouth 2 (two) times daily. Qty: 14 tablet, Refills: 0    !! apixaban (ELIQUIS) 5 MG TABS tablet Take 1 tablet (5 mg total) by mouth 2 (two) times daily. Qty: 60 tablet, Refills: 2     !! - Potential duplicate medications found. Please discuss with provider.    CONTINUE these medications which have CHANGED   Details  oxyCODONE-acetaminophen (PERCOCET) 10-325 MG tablet Take 1 tablet by mouth every 6 (six) hours as needed for pain. Qty: 20 tablet, Refills: 0   Associated Diagnoses: Malignant neoplasm of rectum ([place])  CONTINUE these medications which have NOT CHANGED   Details  albuterol (PROVENTIL HFA;VENTOLIN HFA) 108 (90 BASE) MCG/ACT inhaler Inhale 1 puff into the lungs every 6 (six) hours as needed for wheezing or shortness of breath.    ARIPiprazole (ABILIFY) 2 MG tablet Take 1 tablet (2 mg total) by mouth daily. Qty: 30 tablet, Refills: 1    budesonide-formoterol (SYMBICORT) 160-4.5 MCG/ACT inhaler Inhale 2 puffs into the lungs 2 (two) times daily. Qty: 1 Inhaler, Refills: 12    Calcium  Carb-Cholecalciferol (CALCIUM 600+D) 600-800 MG-UNIT TABS Take 1 tablet by mouth 2 (two) times daily.     clonazePAM (KLONOPIN) 1 MG tablet Take 1 tablet (1 mg total) by mouth 3 (three) times daily as needed for anxiety. Qty: 90 tablet, Refills: 1    DULoxetine (CYMBALTA) 60 MG capsule Take 1 capsule (60 mg total) by mouth daily. Qty: 30 capsule, Refills: 1    escitalopram (LEXAPRO) 20 MG tablet Take 1 tablet (20 mg total) by mouth daily. Qty: 30 tablet, Refills: 1    etodolac (LODINE) 500 MG tablet Take 500 mg by mouth 2 (two) times daily.    lidocaine (XYLOCAINE) 2 % jelly Apply 1 application topically as needed (for pain).    oxybutynin (DITROPAN) 5 MG tablet Take 5 mg by mouth 2 (two) times daily.    psyllium (METAMUCIL) 58.6 % powder Take 1 packet by mouth 2 (two) times daily as needed (for constipation).    ranitidine (ZANTAC) 150 MG tablet Take 150 mg by mouth 2 (two) times daily.    senna (SENOKOT) 8.6 MG tablet Take 2 tablets by mouth 2 (two) times daily as needed for constipation.    simvastatin (ZOCOR) 40 MG tablet Take 40 mg by mouth at bedtime.     sucralfate (CARAFATE) 1 G tablet Take 1 tablet (1 g total) by mouth 3 (three) times daily. Dissolve tablet in 2-3 tbsp of warm water; swish and swallow. Qty: 90 tablet, Refills: 3    tiotropium (SPIRIVA) 18 MCG inhalation capsule Place 18 mcg into inhaler and inhale daily.    zolpidem (AMBIEN) 10 MG tablet Take 1 tablet (10 mg total) by mouth at bedtime as needed for sleep. Qty: 30 tablet, Refills: 1      STOP taking these medications     potassium chloride SA (K-DUR,KLOR-CON) 20 MEQ tablet      traMADol (ULTRAM) 50 MG tablet          DISCHARGE INSTRUCTIONS:   1. Oncology f/u in 1 week 2. PCP f/u in 2 weeks 3. Vascular f/u with Dr. Lucky Cowboy in 1-2 weeks  If you experience worsening of your admission symptoms, develop shortness of breath, life threatening emergency, suicidal or homicidal thoughts you must seek  medical attention immediately by calling 911 or calling your MD immediately  if symptoms less severe.  You Must read complete instructions/literature along with all the possible adverse reactions/side effects for all the Medicines you take and that have been prescribed to you. Take any new Medicines after you have completely understood and accept all the possible adverse reactions/side effects.   Please note  You were cared for by a hospitalist during your hospital stay. If you have any questions about your discharge medications or the care you received while you were in the hospital after you are discharged, you can call the unit and asked to speak with the hospitalist on call if the hospitalist that took care of you is not available. Once you are discharged, your  primary care physician will handle any further medical issues. Please note that NO REFILLS for any discharge medications will be authorized once you are discharged, as it is imperative that you return to your primary care physician (or establish a relationship with a primary care physician if you do not have one) for your aftercare needs so that they can reassess your need for medications and monitor your lab values.    Today   CHIEF COMPLAINT:   Chief Complaint  Patient presents with  . Arm Swelling    VITAL SIGNS:  Blood pressure 116/56, pulse 81, temperature 97.8 F (36.6 C), temperature source Oral, resp. rate 18, height '5\' 5"'$  (1.651 m), weight 87.998 kg (194 lb), SpO2 94 %.  I/O:   Intake/Output Summary (Last 24 hours) at 10/17/14 1052 Last data filed at 10/16/14 1225  Gross per 24 hour  Intake      0 ml  Output      1 ml  Net     -1 ml    PHYSICAL EXAMINATION:   Physical Exam  GENERAL: 59 y.o.-year-old patient lying in the bed with no acute distress.  EYES: Pupils equal, round, reactive to light and accommodation. No scleral icterus. Extraocular muscles intact.  HEENT: Head atraumatic, normocephalic.  Oropharynx and nasopharynx clear.  NECK: Supple, no jugular venous distention. No thyroid enlargement, no tenderness.  LUNGS: Normal breath sounds bilaterally, no wheezing, rales,rhonchi or crepitation. No use of accessory muscles of respiration.  CARDIOVASCULAR: S1, S2 normal. No murmurs, rubs, or gallops.  dermaglue at the portacath incision site, erythema and some swelling of the chest wall noted. ABDOMEN: Soft, nontender, nondistended. Bowel sounds present. No organomegaly or mass.  EXTREMITIES: Left arm is still swollen but less tender to touch. No pedal edema, cyanosis, or clubbing.  NEUROLOGIC: Cranial nerves II through XII are intact. Muscle strength 5/5 in all extremities. Sensation intact. Gait not checked.  PSYCHIATRIC: The patient is alert and oriented x 3.  SKIN: No obvious rash, lesion, or ulcer.   DATA REVIEW:   CBC  Recent Labs Lab 10/17/14 0528  WBC 6.6  HGB 10.8*  HCT 32.7*  PLT 129*    Chemistries   Recent Labs Lab 10/14/14 1945  10/16/14 0414  NA 139  < > 140  K 3.5  < > 3.9  CL 105  < > 107  CO2 27  < > 28  GLUCOSE 101*  < > 91  BUN 6  < > 9  CREATININE 0.58  < > 0.72  CALCIUM 8.6*  < > 8.5*  AST 23  --   --   ALT 9*  --   --   ALKPHOS 60  --   --   BILITOT 0.5  --   --   < > = values in this interval not displayed.  Cardiac Enzymes No results for input(s): TROPONINI in the last 168 hours.  Microbiology Results  No results found for this or any previous visit.  RADIOLOGY:  No results found.  EKG:   Orders placed or performed during the hospital encounter of 08/04/14  . ED EKG  . ED EKG  . EKG      Management plans discussed with the patient, family and they are in agreement.  CODE STATUS:     Code Status Orders        Start     Ordered   10/14/14 2259  Full code   Continuous     10/14/14 2259  Advance Directive Documentation        Most Recent Value   Type of Advance Directive  Healthcare Power of Attorney,  Living will   Pre-existing out of facility DNR order (yellow form or pink MOST form)     "MOST" Form in Place?        TOTAL TIME TAKING CARE OF THIS PATIENT: 40 minutes.    Gladstone Lighter M.D on 10/17/2014 at 10:52 AM  Between 7am to 6pm - Pager - [phone]  After 6pm go to [email or web address] - password [email or web address] [email or web address]  Office  [phone]  CC: Primary care physician; Lavera Guise, MD

## 2014-10-17 NOTE — Progress Notes (Signed)
Discharge instruction given, pt verbalized understanding 

## 2014-10-18 ENCOUNTER — Inpatient Hospital Stay: Payer: Commercial Managed Care - HMO

## 2014-10-18 ENCOUNTER — Encounter: Payer: Commercial Managed Care - HMO | Admitting: Surgery

## 2014-10-19 ENCOUNTER — Other Ambulatory Visit: Payer: Self-pay | Admitting: *Deleted

## 2014-10-19 ENCOUNTER — Inpatient Hospital Stay: Payer: Commercial Managed Care - HMO

## 2014-10-19 ENCOUNTER — Encounter: Payer: Commercial Managed Care - HMO | Admitting: Surgery

## 2014-10-19 DIAGNOSIS — C20 Malignant neoplasm of rectum: Secondary | ICD-10-CM

## 2014-10-19 NOTE — Telephone Encounter (Signed)
Pt reports that she forgot about the pi;lls she had before admission and that they are there somewhere and she will find them and then apologized for forgetting about them. She agrees to come see Dr Mike Gip on Monday after wound care

## 2014-10-19 NOTE — Telephone Encounter (Signed)
Discharged from hospital Tuesday, bariatric chamber on hold until the DVT can be taken care of. Asking when she can get refill on her pain med Oxycodone APAP 5/325, She got # 20 tabs at discharge and had some left from before she was admitted and reports that she has #12 tabs left. She would like to pick up rx tomorrow while she is at Vascular for an appt. Should she be on something long acting?

## 2014-10-19 NOTE — Telephone Encounter (Signed)
Left message that we will not be refilling her pain med that she should have enough med to last until the 18th and that Dr Mike Gip wants to see her on the 17th so I scheduled her for 8:30 on Monday and to please call me back to confirm

## 2014-10-20 ENCOUNTER — Inpatient Hospital Stay: Payer: Commercial Managed Care - HMO

## 2014-10-20 ENCOUNTER — Inpatient Hospital Stay: Payer: Commercial Managed Care - HMO | Attending: Hematology and Oncology

## 2014-10-20 ENCOUNTER — Encounter: Payer: Commercial Managed Care - HMO | Admitting: Surgery

## 2014-10-20 ENCOUNTER — Other Ambulatory Visit: Payer: Self-pay | Admitting: *Deleted

## 2014-10-20 NOTE — Patient Outreach (Signed)
Attempt made to contact pt as part of transition of care, view in Epic shows pt discharged 10/11 (DVT left arm).  HIPPA compliant voice message left with contact number.  If no response, will try again.      [name]   [facility]  [phone]

## 2014-10-22 ENCOUNTER — Emergency Department
Admission: EM | Admit: 2014-10-22 | Discharge: 2014-10-22 | Disposition: A | Discharge status: Home / Self Care | Payer: Commercial Managed Care - HMO | Attending: Emergency Medicine | Admitting: Emergency Medicine

## 2014-10-22 ENCOUNTER — Emergency Department: Payer: Commercial Managed Care - HMO

## 2014-10-22 DIAGNOSIS — I82622 Acute embolism and thrombosis of deep veins of left upper extremity: Secondary | ICD-10-CM | POA: Diagnosis not present

## 2014-10-22 DIAGNOSIS — M79602 Pain in left arm: Secondary | ICD-10-CM | POA: Diagnosis present

## 2014-10-22 DIAGNOSIS — J441 Chronic obstructive pulmonary disease with (acute) exacerbation: Secondary | ICD-10-CM | POA: Insufficient documentation

## 2014-10-22 DIAGNOSIS — Z720 Tobacco use: Secondary | ICD-10-CM | POA: Insufficient documentation

## 2014-10-22 DIAGNOSIS — Z79899 Other long term (current) drug therapy: Secondary | ICD-10-CM | POA: Diagnosis not present

## 2014-10-22 DIAGNOSIS — I82402 Acute embolism and thrombosis of unspecified deep veins of left lower extremity: Secondary | ICD-10-CM

## 2014-10-22 DIAGNOSIS — I10 Essential (primary) hypertension: Secondary | ICD-10-CM | POA: Insufficient documentation

## 2014-10-22 DIAGNOSIS — L03114 Cellulitis of left upper limb: Secondary | ICD-10-CM | POA: Insufficient documentation

## 2014-10-22 LAB — CBC WITH DIFFERENTIAL/PLATELET
BASOS ABS: 0.1 10*3/uL (ref 0–0.1)
Basophils Relative: 1 %
EOS ABS: 0.5 10*3/uL (ref 0–0.7)
Eosinophils Relative: 7 %
HCT: 34.2 % — ABNORMAL LOW (ref 35.0–47.0)
HEMOGLOBIN: 11.6 g/dL — AB (ref 12.0–16.0)
LYMPHS ABS: 0.9 10*3/uL — AB (ref 1.0–3.6)
Lymphocytes Relative: 11 %
MCH: 34.8 pg — AB (ref 26.0–34.0)
MCHC: 33.9 g/dL (ref 32.0–36.0)
MCV: 102.5 fL — ABNORMAL HIGH (ref 80.0–100.0)
Monocytes Absolute: 0.6 10*3/uL (ref 0.2–0.9)
Monocytes Relative: 8 %
NEUTROS PCT: 73 %
Neutro Abs: 5.6 10*3/uL (ref 1.4–6.5)
Platelets: 135 10*3/uL — ABNORMAL LOW (ref 150–440)
RBC: 3.34 MIL/uL — AB (ref 3.80–5.20)
RDW: 14.9 % — ABNORMAL HIGH (ref 11.5–14.5)
WBC: 7.6 10*3/uL (ref 3.6–11.0)

## 2014-10-22 LAB — TROPONIN I

## 2014-10-22 LAB — COMPREHENSIVE METABOLIC PANEL
ALK PHOS: 60 U/L (ref 38–126)
ALT: 10 U/L — AB (ref 14–54)
AST: 23 U/L (ref 15–41)
Albumin: 2.8 g/dL — ABNORMAL LOW (ref 3.5–5.0)
Anion gap: 4 — ABNORMAL LOW (ref 5–15)
BUN: 10 mg/dL (ref 6–20)
CALCIUM: 8.6 mg/dL — AB (ref 8.9–10.3)
CHLORIDE: 105 mmol/L (ref 101–111)
CO2: 29 mmol/L (ref 22–32)
CREATININE: 0.68 mg/dL (ref 0.44–1.00)
Glucose, Bld: 95 mg/dL (ref 65–99)
Potassium: 3.3 mmol/L — ABNORMAL LOW (ref 3.5–5.1)
Sodium: 138 mmol/L (ref 135–145)
Total Bilirubin: 0.4 mg/dL (ref 0.3–1.2)
Total Protein: 6.1 g/dL — ABNORMAL LOW (ref 6.5–8.1)

## 2014-10-22 LAB — PROTIME-INR
INR: 1.13
PROTHROMBIN TIME: 14.7 s (ref 11.4–15.0)

## 2014-10-22 MED ORDER — DEXTROSE 5 % IV SOLN
1.0000 g | Freq: Once | INTRAVENOUS | Status: AC
  Administered 2014-10-22: 1 g via INTRAVENOUS
  Filled 2014-10-22: qty 10

## 2014-10-22 MED ORDER — WARFARIN - PHYSICIAN DOSING INPATIENT
Freq: Every day | Status: DC
  Filled 2014-10-22 (×4): qty 1

## 2014-10-22 MED ORDER — WARFARIN SODIUM 5 MG PO TABS: 5.0000 mg | ORAL_TABLET | Freq: Every day | ORAL | Status: DC

## 2014-10-22 MED ORDER — IOHEXOL 350 MG/ML SOLN
75.0000 mL | Freq: Once | INTRAVENOUS | Status: AC | PRN
  Administered 2014-10-22: 75 mL via INTRAVENOUS

## 2014-10-22 MED ORDER — OXYCODONE-ACETAMINOPHEN 5-325 MG PO TABS
1.0000 | ORAL_TABLET | Freq: Once | ORAL | Status: AC
  Administered 2014-10-22: 1 via ORAL
  Filled 2014-10-22: qty 1

## 2014-10-22 MED ORDER — ONDANSETRON HCL 4 MG/2ML IJ SOLN
4.0000 mg | Freq: Once | INTRAMUSCULAR | Status: AC
  Administered 2014-10-22: 4 mg via INTRAVENOUS
  Filled 2014-10-22: qty 2

## 2014-10-22 MED ORDER — SODIUM CHLORIDE 0.9 % IV BOLUS (SEPSIS)
500.0000 mL | Freq: Once | INTRAVENOUS | Status: AC
  Administered 2014-10-22: 500 mL via INTRAVENOUS

## 2014-10-22 MED ORDER — OXYCODONE-ACETAMINOPHEN 10-325 MG PO TABS: 1.0000 | ORAL_TABLET | Freq: Four times a day (QID) | ORAL | Status: DC | PRN

## 2014-10-22 MED ORDER — WARFARIN SODIUM 5 MG PO TABS
5.0000 mg | ORAL_TABLET | Freq: Once | ORAL | Status: AC
  Administered 2014-10-22: 5 mg via ORAL
  Filled 2014-10-22 (×2): qty 1

## 2014-10-22 MED ORDER — AMOXICILLIN 500 MG PO CAPS: 500.0000 mg | ORAL_CAPSULE | Freq: Three times a day (TID) | ORAL | Status: DC

## 2014-10-22 MED ORDER — ENOXAPARIN SODIUM 150 MG/ML ~~LOC~~ SOLN: 80.0000 mg | Freq: Two times a day (BID) | SUBCUTANEOUS | Status: DC

## 2014-10-22 MED ORDER — MORPHINE SULFATE (PF) 4 MG/ML IV SOLN
4.0000 mg | Freq: Once | INTRAVENOUS | Status: AC
  Administered 2014-10-22: 4 mg via INTRAVENOUS
  Filled 2014-10-22: qty 1

## 2014-10-22 MED ORDER — ENOXAPARIN SODIUM 80 MG/0.8ML ~~LOC~~ SOLN
1.0000 mg/kg | Freq: Once | SUBCUTANEOUS | Status: AC
  Administered 2014-10-22: 80 mg via SUBCUTANEOUS
  Filled 2014-10-22 (×2): qty 0.8

## 2014-10-22 NOTE — ED Notes (Signed)
NAD noted at this time. Pt resting in bed drinking coffee. Pt alert and oriented. Denies pain at this time.

## 2014-10-22 NOTE — ED Notes (Signed)
Report to noel, rn.

## 2014-10-22 NOTE — ED Provider Notes (Signed)
[facility] Emergency Department Provider Note  ____________________________________________  Time seen: Approximately 1:39 AM  I have reviewed the triage vital signs and the nursing notes.   HISTORY  Chief Complaint Arm Pain    HPI [name] is a 59 y.o. female who presents to the ED from home with a chief complaint of left upper extremity pain radiating into her chest and shortness of breath. Patient had recent hospitalization from 10/8-10/11 for left upper extremity DVT s/p thrombolyzes and thrombectomy with Port-A-Cath removal. She was discharged from the hospital on a liquid, which the patient has been unable to fill secondary to finances. Patient has a similar history of DVT to right upper extremity several years prior for which she was on Coumadin and Lovenox. Patient returns to the ED tonight for continued pain in her left upper extremity which is now radiating into her left chest and associated with shortness of breath. Nothing makes her symptoms better. Exertion makes her symptoms worse.   Past Medical History  Diagnosis Date  . Schizophrenia ([place])   . Asthma   . GERD (gastroesophageal reflux disease)   . Anxiety   . Depression   . Bipolar disorder ([place])   . COPD (chronic obstructive pulmonary disease) ([place])   . Occasional tremors     right hand  . PTSD (post-traumatic stress disorder)   . Shortness of breath dyspnea   . Fibromyalgia   . DVT (deep venous thrombosis) ([place]) 2011    RUE  . Thyroid nodule   . DDD (degenerative disc disease), lumbar   . Spinal stenosis   . Peripheral neuropathy ([place])   . Rotator cuff tear     right  . Pneumonia 2011  . Hypothyroidism     no meds currently  . Anemia     during pregnancy only  . Hypertension     Off meds x 15 years-well controlled now per pt  . Squamous cell cancer, anus (HCC)   . Severe obstructive sleep apnea 06/27/2014  . DVT of upper extremity (deep vein thrombosis) ([place])  10/14/2014    Patient Active Problem List   Diagnosis Date Noted  . DVT of upper extremity (deep vein thrombosis) ([place]) 10/14/2014  . B12 deficiency 09/28/2014  . Folate deficiency 09/28/2014  . Macrocytosis 09/21/2014  . Depression   . Hypokalemia 07/20/2014  . Anal cancer ([place]) 06/27/2014  . Anal fissure 06/27/2014  . Anxiety 06/27/2014  . Airway hyperreactivity 06/27/2014  . H/O manic depressive disorder 06/27/2014  . CAFL (chronic airflow limitation) ([place]) 06/27/2014  . Clinical depression 06/27/2014  . Deep vein thrombosis ([place]) 06/27/2014  . External hemorrhoid 06/27/2014  . Myalgia and myositis 06/27/2014  . Acid reflux 06/27/2014  . Hemorrhoid 06/27/2014  . BP (high blood pressure) 06/27/2014  . HLD (hyperlipidemia) 06/27/2014  . Adult hypothyroidism 06/27/2014  . LBP (low back pain) 06/27/2014  . Mass of perianal area 06/27/2014  . External hemorrhoid, thrombosed 06/27/2014  . 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 Ulcerated hemorrhoid 06/27/2014  . Severe obstructive sleep apnea 06/27/2014  . Squamous cell cancer, anus (HCC)   . Rectal ulcer 05/17/2014    Past Surgical History  Procedure Laterality Date  . Foot surgery Right   . Tubal ligation    . Eye surgery Bilateral   . Mouth surgery  2002  . Rectal biopsy N/A 05/08/2014    Procedure: BIOPSY RECTAL;  Surgeon: Marlyce Huge, MD;  Location: [facility] ORS;  Service: General;  Laterality: N/A;  .  Evaluation under anesthesia with hemorrhoidectomy N/A 05/08/2014    Procedure: EXAM UNDER ANESTHESIA WITH HEMORRHOIDECTOMY;  Surgeon: Marlyce Huge, MD;  Location: [facility] ORS;  Service: General;  Laterality: N/A;  . Portacath placement N/A 05/20/2014    Procedure: INSERTION PORT-A-CATH;  Surgeon: Florene Glen, MD;  Location: [facility] ORS;  Service: General;  Laterality: N/A;  . Laparoscopic diverted colostomy N/A 05/26/2014    Procedure: LAPAROSCOPIC DIVERTED COLOSTOMY;  Surgeon: Marlyce Huge, MD;   Location: [facility] ORS;  Service: General;  Laterality: N/A;  . Peripheral vascular catheterization Left 10/16/2014    Procedure: Upper Extremity Venography with thrombectomy, port removal;  Surgeon: Algernon Huxley, MD;  Location: [place] [facility];  Service: Cardiovascular;  Laterality: Left;  . Peripheral vascular catheterization  10/16/2014    Procedure: Upper Extremity Intervention;  Surgeon: Algernon Huxley, MD;  Location: [place] [facility];  Service: Cardiovascular;;    Current Outpatient Rx  Name  Route  Sig  Dispense  Refill  . albuterol (PROVENTIL HFA;VENTOLIN HFA) 108 (90 BASE) MCG/ACT inhaler   Inhalation   Inhale 1 puff into the lungs every 6 (six) hours as needed for wheezing or shortness of breath.         Marland Kitchen apixaban (ELIQUIS) 5 MG TABS tablet   Oral   Take 2 tablets (10 mg total) by mouth 2 (two) times daily.   14 tablet   0     Please take '10mg'$  PO BID for 1 week followed by '5mg'$  ...   . apixaban (ELIQUIS) 5 MG TABS tablet   Oral   Take 1 tablet (5 mg total) by mouth 2 (two) times daily.   60 tablet   2     Start after the 1st week of '10mg'$  bid dosing   . ARIPiprazole (ABILIFY) 2 MG tablet   Oral   Take 1 tablet (2 mg total) by mouth daily.   30 tablet   1   . budesonide-formoterol (SYMBICORT) 160-4.5 MCG/ACT inhaler   Inhalation   Inhale 2 puffs into the lungs 2 (two) times daily.   1 Inhaler   12   . Calcium Carb-Cholecalciferol (CALCIUM 600+D) 600-800 MG-UNIT TABS   Oral   Take 1 tablet by mouth 2 (two) times daily.          . clonazePAM (KLONOPIN) 1 MG tablet   Oral   Take 1 tablet (1 mg total) by mouth 3 (three) times daily as needed for anxiety.   90 tablet   1   . DULoxetine (CYMBALTA) 60 MG capsule   Oral   Take 1 capsule (60 mg total) by mouth daily.   30 capsule   1   . escitalopram (LEXAPRO) 20 MG tablet   Oral   Take 1 tablet (20 mg total) by mouth daily.   30 tablet   1   . etodolac (LODINE) 500 MG tablet   Oral   Take 500 mg  by mouth 2 (two) times daily.         Marland Kitchen lidocaine (XYLOCAINE) 2 % jelly   Topical   Apply 1 application topically as needed (for pain).         Marland Kitchen oxybutynin (DITROPAN) 5 MG tablet   Oral   Take 5 mg by mouth 2 (two) times daily.         Marland Kitchen oxyCODONE-acetaminophen (PERCOCET) 10-325 MG tablet   Oral   Take 1 tablet by mouth every 6 (six) hours as needed for pain.  20 tablet   0   . psyllium (METAMUCIL) 58.6 % powder   Oral   Take 1 packet by mouth 2 (two) times daily as needed (for constipation).         . ranitidine (ZANTAC) 150 MG tablet   Oral   Take 150 mg by mouth 2 (two) times daily.         Marland Kitchen senna (SENOKOT) 8.6 MG tablet   Oral   Take 2 tablets by mouth 2 (two) times daily as needed for constipation.         . simvastatin (ZOCOR) 40 MG tablet   Oral   Take 40 mg by mouth at bedtime.          . sucralfate (CARAFATE) 1 G tablet   Oral   Take 1 tablet (1 g total) by mouth 3 (three) times daily. Dissolve tablet in 2-3 tbsp of warm water; swish and swallow.   90 tablet   3   . tiotropium (SPIRIVA) 18 MCG inhalation capsule   Inhalation   Place 18 mcg into inhaler and inhale daily.         Marland Kitchen zolpidem (AMBIEN) 10 MG tablet   Oral   Take 1 tablet (10 mg total) by mouth at bedtime as needed for sleep.   30 tablet   1     Allergies Sulfa antibiotics  Family History  Problem Relation Age of Onset  . Cancer Maternal Aunt   . Cancer Paternal Uncle   . Diabetes Mother   . Thyroid disease Mother   . Kidney failure Mother   . Hypertension Mother   . Depression Mother     Social History Social History  Substance Use Topics  . Smoking status: Current Every Day Smoker -- 0.25 packs/day for 44 years    Types: Cigarettes    Start date: 10/04/1970  . Smokeless tobacco: Never Used  . Alcohol Use: No     Comment: 1 drink every 2-3 times/year    Review of Systems Constitutional: No fever/chills Eyes: No visual changes. ENT: No sore  throat. Cardiovascular: Positive for chest pain Respiratory: Positive for shortness of breath Abdominal: No abdominal pain.  No nausea, no vomiting.  No diarrhea.  No constipation. Genitourinary: Negative for dysuria. Musculoskeletal: Positive for LUE pain. Negative for back pain. Skin: Negative for rash. Neurological: Negative for headaches, focal weakness or numbness.  10-point ROS otherwise negative.  ____________________________________________   PHYSICAL EXAM:  VITAL SIGNS: ED Triage Vitals  Enc Vitals Group     BP 10/22/14 0023 118/61 mmHg     Pulse Rate 10/22/14 0023 87     Resp 10/22/14 0023 20     Temp 10/22/14 0023 98.5 F (36.9 C)     Temp Source 10/22/14 0023 Oral     SpO2 10/22/14 0023 100 %     Weight 10/22/14 0023 178 lb (80.74 kg)     Height 10/22/14 0023 5' 6.5" (1.689 m)     Head Cir --      Peak Flow --      Pain Score 10/22/14 0037 7     Pain Loc --      Pain Edu? --      Excl. in [place]? --     Constitutional: Alert and oriented. Well appearing and in no acute distress. Eyes: Conjunctivae are normal. PERRL. EOMI. Head: Atraumatic. Nose: No congestion/rhinnorhea. Mouth/Throat: Mucous membranes are moist.  Oropharynx non-erythematous. Neck: No stridor.   Cardiovascular: Normal rate, regular  rhythm. Grossly normal heart sounds.  Good peripheral circulation. Respiratory: Normal respiratory effort.  No retractions. Lungs CTAB. Postop site on left anterior chest clean/dry/intact.  Gastrointestinal: Soft and nontender. No distention. No abdominal bruits. No CVA tenderness. Musculoskeletal:  Left upper extremity mildly swollen compared to right associated with tenderness to palpation. 2+ radial pulses. 5/5 motor strength and sensation. No lower extremity tenderness nor edema.  No joint effusions. Neurologic:  Normal speech and language. No gross focal neurologic deficits are appreciated.  Skin:  Skin is warm, dry and intact. No rash noted. Psychiatric: Mood  and affect are normal. Speech and behavior are normal.  ____________________________________________   LABS (all labs ordered are listed, but only abnormal results are displayed)  Labs Reviewed  CBC WITH DIFFERENTIAL/PLATELET - Abnormal; Notable for the following:    RBC 3.34 (*)    Hemoglobin 11.6 (*)    HCT 34.2 (*)    MCV 102.5 (*)    MCH 34.8 (*)    RDW 14.9 (*)    Platelets 135 (*)    Lymphs Abs 0.9 (*)    All other components within normal limits  COMPREHENSIVE METABOLIC PANEL - Abnormal; Notable for the following:    Potassium 3.3 (*)    Calcium 8.6 (*)    Total Protein 6.1 (*)    Albumin 2.8 (*)    ALT 10 (*)    Anion gap 4 (*)    All other components within normal limits  TROPONIN I  PROTIME-INR   ____________________________________________  EKG  ED ECG REPORT I, Damyn Weitzel J, the attending physician, personally viewed and interpreted this ECG.   Date: 10/22/2014  EKG Time: 0040  Rate: 87  Rhythm: normal EKG, normal sinus rhythm  Axis: Normal  Intervals:none  ST&T Change: Nonspecific  ____________________________________________  RADIOLOGY  CTA chest interpreted per Dr. Gerilyn Nestle: No evidence of significant pulmonary embolus. Small focal areas of consolidation or atelectasis in the right lung base. This may represent focal pneumonia. Infiltration in the subcutaneous fat around the left axillary and subclavian veins with enlarging lymph nodes in the left axilla suggesting inflammatory process. Cellulitis or thrombophlebitis could potentially have this appearance.  ____________________________________________   PROCEDURES  Procedure(s) performed: None  Critical Care performed: No  ____________________________________________   INITIAL IMPRESSION / ASSESSMENT AND PLAN / ED COURSE  Pertinent labs & imaging results that were available during my care of the patient were reviewed by me and considered in my medical decision making (see chart for  details).  59 year old female with a recent hospitalization for left upper extremity DVT status post Port-A-Cath removal as well as thrombectomy who is not taking eloquent class as prescribed secondary to financial issues presents with persistent pain, now radiating to her left chest associated with shortness of breath. While patient is not hypoxic on exam; she is certainly at risk for pulmonary embolus. Will obtain CTA chest to evaluate for pulmonary embolism.  ----------------------------------------- 4:57 AM on 10/22/2014 -----------------------------------------  Patient improved. Updated patient of CT findings. Feel likely right lung base more consistent with atelectasis rather than focal pneumonia as patient denies infectious symptoms. Given her financial issues with obtaining eloquent requests, in her past success with Lovenox and Coumadin, will start Lovenox 1 mg/kg twice daily 5 days as she starts Coumadin 5 mg daily. Will also initiate antibiotic treatment with amoxicillin for probable developing cellulitis in left arm. Patient has an appointment with her PCP, Dr. Suzan Garibaldi, in 2 days. Strict return precautions given. Patient verbalizes understanding and agrees  with plan of care.  ____________________________________________   FINAL CLINICAL IMPRESSION(S) / ED DIAGNOSES  Final diagnoses:  Cellulitis of left upper extremity  DVT (deep venous thrombosis), left      Paulette Blanch, MD 10/22/14 [phone]

## 2014-10-22 NOTE — ED Notes (Signed)
Pt with recent dvt in left arm from fingers to "carotid". Pt states was on heparin drip while hospitalized. Pt states was discharged home with eliquis prescription but was unable to afford prescription. Pt with increased pain and swelling to left arm. Pt states now pain radiates to left lateral breast and pt has exertional shob. Cms intact to all fingers. Pt with redness noted to medial left humeral area. Pt states "that arm just feels like fire if i lay on it."

## 2014-10-22 NOTE — ED Notes (Signed)
Pt admitted to [facility] last weekend for DVT to left arm; discharged on the 11th with a prescription for Eliquis; pt says she has not had prescription filled as she cannot afford it and the prescription wasn't completely filled out; pt says swelling to arm is about the same but pain has increased and now extends to the outer side of her left breast; pt says this is making her short of breath;

## 2014-10-22 NOTE — Discharge Instructions (Signed)
1. As we discussed, since alkalosis so expensive, we will treat you with Lovenox and Coumadin. Please take Lovenox shots twice daily for a total of 5 days. At the same time, please start Coumadin 5 mg daily (#30). 2. Take antibiotic as prescribed (amoxicillin 500 mg 3 times daily 7 days). 3. You may take pain medicine as needed (Percocet #15). 4. Return to the ER for worsening symptoms, persistent vomiting, difficulty breathing or other concerns.  Deep Vein Thrombosis A deep vein thrombosis (DVT) is a blood clot (thrombus) that usually occurs in a deep, larger vein of the lower leg or the pelvis, or in an upper extremity such as the arm. These are dangerous and can lead to serious and even life-threatening complications if the clot travels to the lungs. A DVT can damage the valves in your leg veins so that instead of flowing upward, the blood pools in the lower leg. This is called post-thrombotic syndrome, and it can result in pain, swelling, discoloration, and sores on the leg. CAUSES A DVT is caused by the formation of a blood clot in your leg, pelvis, or arm. Usually, several things contribute to the formation of blood clots. A clot may develop when:  Your blood flow slows down.  Your vein becomes damaged in some way.  You have a condition that makes your blood clot more easily. RISK FACTORS A DVT is more likely to develop in:  People who are older, especially over 50 years of age.  People who are overweight (obese).  People who sit or lie still for a long time, such as during long-distance travel (over 4 hours), bed rest, hospitalization, or during recovery from certain medical conditions like a stroke.  People who do not engage in much physical activity (sedentary lifestyle).  People who have chronic breathing disorders.  People who have a personal or family history of blood clots or blood clotting disease.  People who have peripheral vascular disease (PVD), diabetes, or some  types of cancer.  People who have heart disease, especially if the person had a recent heart attack or has congestive heart failure.  People who have neurological diseases that affect the legs (leg paresis).  People who have had a traumatic injury, such as breaking a hip or leg.  People who have recently had major or lengthy surgery, especially on the hip, knee, or abdomen.  People who have had a central line placed inside a large vein.  People who take medicines that contain the hormone estrogen. These include birth control pills and hormone replacement therapy.  Pregnancy or during childbirth or the postpartum period.  Long plane flights (over 8 hours). SIGNS AND SYMPTOMS Symptoms of a DVT can include:   Swelling of your leg or arm, especially if one side is much worse.  Warmth and redness of your leg or arm, especially if one side is much worse.  Pain in your arm or leg. If the clot is in your leg, symptoms may be more noticeable or worse when you stand or walk.  A feeling of pins and needles, if the clot is in the arm. The symptoms of a DVT that has traveled to the lungs (pulmonary embolism, PE) usually start suddenly and include:  Shortness of breath while active or at rest.  Coughing or coughing up blood or blood-tinged mucus.  Chest pain that is often worse with deep breaths.  Rapid or irregular heartbeat.  Feeling light-headed or dizzy.  Fainting.  Feeling anxious.  Sweating. There  may also be pain and swelling in a leg if that is where the blood clot started. These symptoms may represent a serious problem that is an emergency. Do not wait to see if the symptoms will go away. Get medical help right away. Call your local emergency services (911 in the [place] Do not drive yourself to the hospital. DIAGNOSIS Your health care provider will take a medical history and perform a physical exam. You may also have other tests, including:  Blood tests to assess the  clotting properties of your blood.  Imaging tests, such as CT, ultrasound, MRI, X-ray, and other tests to see if you have clots anywhere in your body. TREATMENT After a DVT is identified, it can be treated. The type of treatment that you receive depends on many factors, such as the cause of your DVT, your risk for bleeding or developing more clots, and other medical conditions that you have. Sometimes, a combination of treatments is necessary. Treatment options may be combined and include:  Monitoring the blood clot with ultrasound.  Taking medicines by mouth, such as newer blood thinners (anticoagulants), thrombolytics, or warfarin.  Taking anticoagulant medicine by injection or through an IV tube.  Wearing compression stockings or using different types ofdevices.  Surgery (rare) to remove the blood clot or to place a filter in your abdomen to stop the blood clot from traveling to your lungs. Treatments for a DVT are often divided into immediate treatment and long-term treatment (up to 3 months after DVT). You can work with your health care provider to choose the treatment program that is best for you. HOME CARE INSTRUCTIONS If you are taking a newer oral anticoagulant:  Take the medicine every single day at the same time each day.  Understand what foods and drugs interact with this medicine.  Understand that there are no regular blood tests required when using this medicine.  Understand the side effects of this medicine, including excessive bruising or bleeding. Ask your health care provider or pharmacist about other possible side effects. If you are taking warfarin:  Understand how to take warfarin and know which foods can affect how warfarin works in Veterinary surgeon.  Understand that it is dangerous to take too much or too little warfarin. Too much warfarin increases the risk of bleeding. Too little warfarin continues to allow the risk for blood clots.  Follow your PT and INR blood  testing schedule. The PT and INR results allow your health care provider to adjust your dose of warfarin. It is very important that you have your PT and INR tested as often as told by your health care provider.  Avoid major changes in your diet, or tell your health care provider before you change your diet. Arrange a visit with a registered dietitian to answer your questions. Many foods, especially foods that are high in vitamin K, can interfere with warfarin and affect the PT and INR results. Eat a consistent amount of foods that are high in vitamin K, such as:  Spinach, kale, broccoli, cabbage, collard greens, turnip greens, Brussels sprouts, peas, cauliflower, seaweed, and parsley.  Beef liver and pork liver.  Green tea.  Soybean oil.  Tell your health care provider about any and all medicines, vitamins, and supplements that you take, including aspirin and other over-the-counter anti-inflammatory medicines. Be especially cautious with aspirin and anti-inflammatory medicines. Do not take those before you ask your health care provider if it is safe to do so. This is important because many  medicines can interfere with warfarin and affect the PT and INR results.  Do not start or stop taking any over-the-counter or prescription medicine unless your health care provider or pharmacist tells you to do so. If you take warfarin, you will also need to do these things:  Hold pressure over cuts for longer than usual.  Tell your dentist and other health care providers that you are taking warfarin before you have any procedures in which bleeding may occur.  Avoid alcohol or drink very small amounts. Tell your health care provider if you change your alcohol intake.  Do not use tobacco products, including cigarettes, chewing tobacco, and e-cigarettes. If you need help quitting, ask your health care provider.  Avoid contact sports. General Instructions  Take over-the-counter and prescription medicines  only as told by your health care provider. Anticoagulant medicines can have side effects, including easy bruising and difficulty stopping bleeding. If you are prescribed an anticoagulant, you will also need to do these things:  Hold pressure over cuts for longer than usual.  Tell your dentist and other health care providers that you are taking anticoagulants before you have any procedures in which bleeding may occur.  Avoid contact sports.  Wear a medical alert bracelet or carry a medical alert card that says you have had a PE.  Ask your health care provider how soon you can go back to your normal activities. Stay active to prevent new blood clots from forming.  Make sure to exercise while traveling or when you have been sitting or standing for a long period of time. It is very important to exercise. Exercise your legs by walking or by tightening and relaxing your leg muscles often. Take frequent walks.  Wear compression stockings as told by your health care provider to help prevent more blood clots from forming.  Do not use tobacco products, including cigarettes, chewing tobacco, and e-cigarettes. If you need help quitting, ask your health care provider.  Keep all follow-up appointments with your health care provider. This is important. PREVENTION Take these actions to decrease your risk of developing another DVT:  Exercise regularly. For at least 30 minutes every day, engage in:  Activity that involves moving your arms and legs.  Activity that encourages good blood flow through your body by increasing your heart rate.  Exercise your arms and legs every hour during long-distance travel (over 4 hours). Drink plenty of water and avoid drinking alcohol while traveling.  Avoid sitting or lying in bed for long periods of time without moving your legs.  Maintain a weight that is appropriate for your height. Ask your health care provider what weight is healthy for you.  If you are a woman  who is over 47 years of age, avoid unnecessary use of medicines that contain estrogen. These include birth control pills.  Do not smoke, especially if you take estrogen medicines. If you need help quitting, ask your health care provider. If you are hospitalized, prevention measures may include:  Early walking after surgery, as soon as your health care provider says that it is safe.  Receiving anticoagulants to prevent blood clots.If you cannot take anticoagulants, other options may be available, such as wearing compression stockings or using different types of devices. SEEK IMMEDIATE MEDICAL CARE IF:  You have new or increased pain, swelling, or redness in an arm or leg.  You have numbness or tingling in an arm or leg.  You have shortness of breath while active or at rest.  You have chest pain.  You have a rapid or irregular heartbeat.  You feel light-headed or dizzy.  You cough up blood.  You notice blood in your vomit, bowel movement, or urine. These symptoms may represent a serious problem that is an emergency. Do not wait to see if the symptoms will go away. Get medical help right away. Call your local emergency services (911 in the [place] Do not drive yourself to the hospital.   This information is not intended to replace advice given to you by your health care provider. Make sure you discuss any questions you have with your health care provider.   Document Released: 12/23/2004 Document Revised: 09/13/2014 Document Reviewed: 04/19/2014 Elsevier Interactive Patient Education 2016 [employer]  Cellulitis Cellulitis is an infection of the skin and the tissue beneath it. The infected area is usually red and tender. Cellulitis occurs most often in the arms and lower legs.  CAUSES  Cellulitis is caused by bacteria that enter the skin through cracks or cuts in the skin. The most common types of bacteria that cause cellulitis are staphylococci and streptococci. SIGNS AND SYMPTOMS    Redness and warmth.  Swelling.  Tenderness or pain.  Fever. DIAGNOSIS  Your health care provider can usually determine what is wrong based on a physical exam. Blood tests may also be done. TREATMENT  Treatment usually involves taking an antibiotic medicine. HOME CARE INSTRUCTIONS   Take your antibiotic medicine as directed by your health care provider. Finish the antibiotic even if you start to feel better.  Keep the infected arm or leg elevated to reduce swelling.  Apply a warm cloth to the affected area up to 4 times per day to relieve pain.  Take medicines only as directed by your health care provider.  Keep all follow-up visits as directed by your health care provider. SEEK MEDICAL CARE IF:   You notice red streaks coming from the infected area.  Your red area gets larger or turns dark in color.  Your bone or joint underneath the infected area becomes painful after the skin has healed.  Your infection returns in the same area or another area.  You notice a swollen bump in the infected area.  You develop new symptoms.  You have a fever. SEEK IMMEDIATE MEDICAL CARE IF:   You feel very sleepy.  You develop vomiting or diarrhea.  You have a general ill feeling (malaise) with muscle aches and pains.   This information is not intended to replace advice given to you by your health care provider. Make sure you discuss any questions you have with your health care provider.   Document Released: 10/02/2004 Document Revised: 09/13/2014 Document Reviewed: 03/10/2011 Elsevier Interactive Patient Education [employer].

## 2014-10-22 NOTE — ED Notes (Signed)
Pt updated on ct process, pt verbalizes understanding.

## 2014-10-22 NOTE — ED Notes (Signed)
NAD noted at this time. Pt refused wheelchair to the lobby. Pt denies pain. Denies comments/concerns at this time.

## 2014-10-23 ENCOUNTER — Inpatient Hospital Stay: Payer: Commercial Managed Care - HMO

## 2014-10-23 ENCOUNTER — Inpatient Hospital Stay: Payer: Commercial Managed Care - HMO | Admitting: Hematology and Oncology

## 2014-10-23 ENCOUNTER — Other Ambulatory Visit: Payer: Self-pay | Admitting: *Deleted

## 2014-10-23 NOTE — Patient Outreach (Signed)
Second attempt made to contact pt as part of ongoing transition of care (discharged 10/11- inpatient for DVT left arm) plus per view in Epic pt went to ED 10/16- pain/swelling in left arm, did not have rx for Eliquis filled/could not afford.   HIPPA compliant voice message left with contact number.  If no response, will try again tomorrow.     [name]   [facility] [facility]  [phone]

## 2014-10-24 ENCOUNTER — Other Ambulatory Visit: Payer: Self-pay | Admitting: *Deleted

## 2014-10-24 ENCOUNTER — Inpatient Hospital Stay: Payer: Commercial Managed Care - HMO

## 2014-10-24 ENCOUNTER — Ambulatory Visit: Payer: Commercial Managed Care - HMO | Admitting: Psychiatry

## 2014-10-24 NOTE — Patient Outreach (Signed)
Attempt made to contact pt's daughter April Smith (on [facility] consent form) since pt has in the past had problems with her phone (this RN CM unable to contact following recent hospital discharge) but  message on phone- voice message not set up yet.  Shortly after received a call back from number belonging to April Smith, phone was disconnected.    Plan to do a final attempt tomorrow.      [name]   [facility]  [phone]

## 2014-10-24 NOTE — Patient Outreach (Signed)
Third attempt made to f/u on recent hospitalization (discharged 10/11) and second attempt on recent ED visit 10/16.  HIPPA compliant voice message left with contact number.  If no response, will contact daughter on consent form.    [name].   [place] [facility]  [phone]

## 2014-10-25 ENCOUNTER — Inpatient Hospital Stay: Payer: Commercial Managed Care - HMO

## 2014-10-25 ENCOUNTER — Telehealth: Payer: Self-pay | Admitting: Surgery

## 2014-10-25 NOTE — Telephone Encounter (Signed)
Returned call no answer. LMOAM

## 2014-10-25 NOTE — Telephone Encounter (Signed)
Patient left a voice message Monday at 4:41 pm that she had bowel coming through her rectum with a colostomy bag. Please call

## 2014-10-26 ENCOUNTER — Inpatient Hospital Stay: Payer: Commercial Managed Care - HMO

## 2014-10-26 ENCOUNTER — Ambulatory Visit: Payer: Self-pay | Admitting: Psychiatry

## 2014-10-26 NOTE — Telephone Encounter (Signed)
Patient called back and reported that she is having rectal bowel movements and was concerned this is not normal. Patient informed that it is normal to have rectal bowel movements even with a colostomy. Patient confirmed understanding of information.

## 2014-10-27 ENCOUNTER — Ambulatory Visit: Payer: Commercial Managed Care - HMO | Admitting: Hematology and Oncology

## 2014-10-27 ENCOUNTER — Inpatient Hospital Stay: Payer: Commercial Managed Care - HMO

## 2014-10-27 ENCOUNTER — Other Ambulatory Visit: Payer: Self-pay | Admitting: *Deleted

## 2014-10-27 NOTE — Telephone Encounter (Signed)
After researching her chart, I see she was given #15 Oxycodones on 10/16 from ER after being refused by Dr Mike Gip. Have attempted numerous times to call patient because she did not show up for her appt today. She is not answering or returning calls. Dr Mike Gip wants to see her MOnday or she will not refill any more meds for her

## 2014-10-27 NOTE — Telephone Encounter (Signed)
States she will run out of med on Sunday. She was a no show for her appt on Monday which Dr Mike Gip requested to discuss her pain meds with her

## 2014-10-27 NOTE — Telephone Encounter (Signed)
I spoke with patient who claims she did not know about her appts she missed. I rebutted that she told me on the 13th that she would come in on the 17th then she claimed that no one came to pick her up. I told her we will not fill the rx but she could come in Monday and see Dr Mike Gip and she would discuss her pain med at that time. She agreed to come in and said she will call the [name] to pick her up and if he cannot, she will have her daughter to bring her in and asked that the appt be after 8:30 due to having to take child to school. Appt for 9:00 scheduled

## 2014-10-30 ENCOUNTER — Other Ambulatory Visit: Payer: Self-pay

## 2014-10-30 ENCOUNTER — Inpatient Hospital Stay: Payer: Commercial Managed Care - HMO

## 2014-10-30 ENCOUNTER — Telehealth: Payer: Self-pay

## 2014-10-30 ENCOUNTER — Inpatient Hospital Stay (HOSPITAL_BASED_OUTPATIENT_CLINIC_OR_DEPARTMENT_OTHER): Payer: Commercial Managed Care - HMO | Admitting: Hematology and Oncology

## 2014-10-30 VITALS — BP 124/81 | HR 86 | Temp 97.0°F | Resp 18 | Ht 66.5 in | Wt 190.3 lb

## 2014-10-30 DIAGNOSIS — I82622 Acute embolism and thrombosis of deep veins of left upper extremity: Secondary | ICD-10-CM | POA: Diagnosis not present

## 2014-10-30 DIAGNOSIS — E039 Hypothyroidism, unspecified: Secondary | ICD-10-CM | POA: Diagnosis not present

## 2014-10-30 DIAGNOSIS — M4800 Spinal stenosis, site unspecified: Secondary | ICD-10-CM

## 2014-10-30 DIAGNOSIS — E876 Hypokalemia: Secondary | ICD-10-CM

## 2014-10-30 DIAGNOSIS — F4310 Post-traumatic stress disorder, unspecified: Secondary | ICD-10-CM

## 2014-10-30 DIAGNOSIS — F209 Schizophrenia, unspecified: Secondary | ICD-10-CM

## 2014-10-30 DIAGNOSIS — F419 Anxiety disorder, unspecified: Secondary | ICD-10-CM

## 2014-10-30 DIAGNOSIS — Z8701 Personal history of pneumonia (recurrent): Secondary | ICD-10-CM | POA: Diagnosis not present

## 2014-10-30 DIAGNOSIS — Z933 Colostomy status: Secondary | ICD-10-CM | POA: Diagnosis not present

## 2014-10-30 DIAGNOSIS — Z923 Personal history of irradiation: Secondary | ICD-10-CM

## 2014-10-30 DIAGNOSIS — Z23 Encounter for immunization: Secondary | ICD-10-CM | POA: Diagnosis not present

## 2014-10-30 DIAGNOSIS — Z9221 Personal history of antineoplastic chemotherapy: Secondary | ICD-10-CM

## 2014-10-30 DIAGNOSIS — C210 Malignant neoplasm of anus, unspecified: Secondary | ICD-10-CM | POA: Diagnosis present

## 2014-10-30 DIAGNOSIS — Z809 Family history of malignant neoplasm, unspecified: Secondary | ICD-10-CM | POA: Diagnosis not present

## 2014-10-30 DIAGNOSIS — G629 Polyneuropathy, unspecified: Secondary | ICD-10-CM

## 2014-10-30 DIAGNOSIS — R0602 Shortness of breath: Secondary | ICD-10-CM | POA: Diagnosis not present

## 2014-10-30 DIAGNOSIS — Z79899 Other long term (current) drug therapy: Secondary | ICD-10-CM | POA: Diagnosis not present

## 2014-10-30 DIAGNOSIS — K219 Gastro-esophageal reflux disease without esophagitis: Secondary | ICD-10-CM

## 2014-10-30 DIAGNOSIS — E041 Nontoxic single thyroid nodule: Secondary | ICD-10-CM

## 2014-10-30 DIAGNOSIS — M797 Fibromyalgia: Secondary | ICD-10-CM

## 2014-10-30 DIAGNOSIS — Z7901 Long term (current) use of anticoagulants: Secondary | ICD-10-CM

## 2014-10-30 DIAGNOSIS — R5383 Other fatigue: Secondary | ICD-10-CM

## 2014-10-30 DIAGNOSIS — D7589 Other specified diseases of blood and blood-forming organs: Secondary | ICD-10-CM

## 2014-10-30 DIAGNOSIS — J45909 Unspecified asthma, uncomplicated: Secondary | ICD-10-CM

## 2014-10-30 DIAGNOSIS — M5136 Other intervertebral disc degeneration, lumbar region: Secondary | ICD-10-CM | POA: Diagnosis not present

## 2014-10-30 DIAGNOSIS — E538 Deficiency of other specified B group vitamins: Secondary | ICD-10-CM

## 2014-10-30 DIAGNOSIS — J449 Chronic obstructive pulmonary disease, unspecified: Secondary | ICD-10-CM

## 2014-10-30 DIAGNOSIS — F329 Major depressive disorder, single episode, unspecified: Secondary | ICD-10-CM | POA: Diagnosis not present

## 2014-10-30 DIAGNOSIS — G4733 Obstructive sleep apnea (adult) (pediatric): Secondary | ICD-10-CM

## 2014-10-30 LAB — CBC WITH DIFFERENTIAL/PLATELET
Basophils Absolute: 0.1 10*3/uL (ref 0–0.1)
Basophils Relative: 1 %
Eosinophils Absolute: 0.4 10*3/uL (ref 0–0.7)
Eosinophils Relative: 8 %
HCT: 37.8 % (ref 35.0–47.0)
Hemoglobin: 12.5 g/dL (ref 12.0–16.0)
Lymphocytes Relative: 14 %
Lymphs Abs: 0.7 10*3/uL — ABNORMAL LOW (ref 1.0–3.6)
MCH: 33.5 pg (ref 26.0–34.0)
MCHC: 33 g/dL (ref 32.0–36.0)
MCV: 101.6 fL — ABNORMAL HIGH (ref 80.0–100.0)
Monocytes Absolute: 0.4 10*3/uL (ref 0.2–0.9)
Monocytes Relative: 8 %
Neutro Abs: 3.8 10*3/uL (ref 1.4–6.5)
Neutrophils Relative %: 69 %
Platelets: 181 10*3/uL (ref 150–440)
RBC: 3.73 MIL/uL — ABNORMAL LOW (ref 3.80–5.20)
RDW: 15.1 % — ABNORMAL HIGH (ref 11.5–14.5)
WBC: 5.4 10*3/uL (ref 3.6–11.0)

## 2014-10-30 LAB — BASIC METABOLIC PANEL
Anion gap: 3 — ABNORMAL LOW (ref 5–15)
BUN: 5 mg/dL — ABNORMAL LOW (ref 6–20)
CO2: 26 mmol/L (ref 22–32)
Calcium: 8.2 mg/dL — ABNORMAL LOW (ref 8.9–10.3)
Chloride: 109 mmol/L (ref 101–111)
Creatinine, Ser: 0.71 mg/dL (ref 0.44–1.00)
GFR calc Af Amer: >60 mL/min (ref >60)
GFR calc non Af Amer: >60 mL/min (ref >60)
Glucose, Bld: 77 mg/dL (ref 65–99)
Potassium: 3.7 mmol/L (ref 3.5–5.1)
Sodium: 138 mmol/L (ref 135–145)

## 2014-10-30 MED ORDER — FOLIC ACID 1 MG PO TABS: 1.0000 mg | ORAL_TABLET | Freq: Every day | ORAL | Status: DC

## 2014-10-30 MED ORDER — OXYCODONE-ACETAMINOPHEN 10-325 MG PO TABS: 1.0000 | ORAL_TABLET | Freq: Four times a day (QID) | ORAL | Status: DC | PRN

## 2014-10-30 MED ORDER — CYANOCOBALAMIN 1000 MCG/ML IJ SOLN
1000.0000 ug | Freq: Once | INTRAMUSCULAR | Status: AC
  Administered 2014-10-30: 1000 ug via INTRAMUSCULAR
  Filled 2014-10-30: qty 1

## 2014-10-30 MED ORDER — INFLUENZA VAC SPLIT QUAD 0.5 ML IM SUSY
0.5000 mL | PREFILLED_SYRINGE | Freq: Once | INTRAMUSCULAR | Status: AC
  Administered 2014-10-30: 0.5 mL via INTRAMUSCULAR
  Filled 2014-10-30: qty 0.5

## 2014-10-30 NOTE — Progress Notes (Signed)
Patient is here for follow-up of anal cancer. Patient was admitted to the hospital on 10/14/14 for DVT on her left side near her port. She had her port removed and was started on lovenox and warfarin. Patient was discharged on October 11th. Patient states that overall she has been feeling good since her discharge. She has been eating well.

## 2014-10-30 NOTE — Progress Notes (Addendum)
[facility] day:  10/30/2014   Chief Complaint: [name] is a 59 y.o. 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  HPI:  The patient was last seen in the medical oncology clinic on 09/21/2014.  At that time, she was day 53 of cycle #2 5FU and mitomycin-C with radiation.  Exam revealed a dramatic improvement in her peri-rectal area since initiation of hyperbaric oxygen.  Her potassium was normal on supplimentation.  Diarrhea had resolved.  During the interim, she was admitted to [facility] from 10/8 until 10/17/2014 with a DVT of the left upper extremity.  Clot was associated with her Port-A-Cath. She was initially placed on heparin and discharged on Eliquis.  Vascular surgery was consulted.  She underwent thrombolysis, thrombectomy, and port removal on 10/16/2014.  The patient states that she is been overwhelmed keeping up with visits. She sees Dr. Lucky Cowboy of vascular medicine tomorrow.  He plans on performing an ultrasound to assess her DVT. She is currently on Lovenox 80 mg subcutaneous twice a day as well as Coumadin 5 mg a day.  She stated that she could not afford Eliquis. Coumadin started around 10/13 or 10/14. She denies any bruising or bleeding.  Her hyperbaric oxygen was stopped secondary to her clot. She states that she still has 4 more weeks left.  She notes low energy. She states that she can cook her own food, go to the laundry, as well as go to the grocery store. Bowels are good. Patient notes that her diet is pretty good. She is gaining weight. She has not picked up her folic acid.   Past Medical History  Diagnosis Date  . Schizophrenia ([place])   . Asthma   . GERD (gastroesophageal reflux disease)   . Anxiety   . Depression   . Bipolar disorder ([place])   . COPD (chronic obstructive  pulmonary disease) ([place])   . Occasional tremors     right hand  . PTSD (post-traumatic stress disorder)   . Shortness of breath dyspnea   . Fibromyalgia   . DVT (deep venous thrombosis) ([place]) 2011    RUE  . Thyroid nodule   . DDD (degenerative disc disease), lumbar   . Spinal stenosis   . Peripheral neuropathy ([place])   . Rotator cuff tear     right  . Pneumonia 2011  . Hypothyroidism     no meds currently  . Anemia     during pregnancy only  . Hypertension     Off meds x 15 years-well controlled now per pt  . Squamous cell cancer, anus ([facility])   . Severe obstructive sleep apnea 06/27/2014  . DVT of upper extremity (deep vein thrombosis) ([place]) 10/14/2014    Past Surgical History  Procedure Laterality Date  . Foot surgery Right   . Tubal ligation    . Eye surgery Bilateral   . Mouth surgery  2002  . Rectal biopsy N/A 05/08/2014    Procedure: BIOPSY RECTAL;  Surgeon: Marlyce Huge, MD;  Location: [facility] ORS;  Service: General;  Laterality: N/A;  . Evaluation under anesthesia with hemorrhoidectomy N/A 05/08/2014    Procedure: EXAM UNDER ANESTHESIA WITH HEMORRHOIDECTOMY;  Surgeon: Marlyce Huge, MD;  Location: [facility] ORS;  Service: General;  Laterality: N/A;  . Portacath placement N/A 05/20/2014    Procedure: INSERTION PORT-A-CATH;  Surgeon:  Florene Glen, MD;  Location: [facility] ORS;  Service: General;  Laterality: N/A;  . Laparoscopic diverted colostomy N/A 05/26/2014    Procedure: LAPAROSCOPIC DIVERTED COLOSTOMY;  Surgeon: Marlyce Huge, MD;  Location: [facility] ORS;  Service: General;  Laterality: N/A;  . Peripheral vascular catheterization Left 10/16/2014    Procedure: Upper Extremity Venography with thrombectomy, port removal;  Surgeon: Algernon Huxley, MD;  Location: [place] [facility];  Service: Cardiovascular;  Laterality: Left;  . Peripheral vascular catheterization  10/16/2014    Procedure: Upper Extremity Intervention;  Surgeon: Algernon Huxley, MD;  Location: [place] [facility];  Service: Cardiovascular;;    Family History  Problem Relation Age of Onset  . Cancer Maternal Aunt   . Cancer Paternal Uncle   . Diabetes Mother   . Thyroid disease Mother   . Kidney failure Mother   . Hypertension Mother   . Depression Mother     Social History:  reports that she has been smoking Cigarettes.  She started smoking about 44 years ago. She has a 11 pack-year smoking history. She has never used smokeless tobacco. She reports that she does not drink alcohol or use illicit drugs.  The patient is alone today.  Allergies:  Allergies  Allergen Reactions  . Sulfa Antibiotics Hives    Current Medications: Current Outpatient Prescriptions  Medication Sig Dispense Refill  . albuterol (PROVENTIL HFA;VENTOLIN HFA) 108 (90 BASE) MCG/ACT inhaler Inhale 1 puff into the lungs every 6 (six) hours as needed for wheezing or shortness of breath.    Marland Kitchen amoxicillin (AMOXIL) 500 MG capsule Take 1 capsule (500 mg total) by mouth 3 (three) times daily. 21 capsule 0  . ARIPiprazole (ABILIFY) 2 MG tablet Take 1 tablet (2 mg total) by mouth daily. 30 tablet 1  . budesonide-formoterol (SYMBICORT) 160-4.5 MCG/ACT inhaler Inhale 2 puffs into the lungs 2 (two) times daily. 1 Inhaler 12  . Calcium Carb-Cholecalciferol (CALCIUM 600+D) 600-800 MG-UNIT TABS Take 1 tablet by mouth 2 (two) times daily.     . clonazePAM (KLONOPIN) 1 MG tablet Take 1 tablet (1 mg total) by mouth 3 (three) times daily as needed for anxiety. 90 tablet 1  . DULoxetine (CYMBALTA) 60 MG capsule Take 1 capsule (60 mg total) by mouth daily. 30 capsule 1  . enoxaparin (LOVENOX) 150 MG/ML injection Inject 0.53 mLs (80 mg total) into the skin every 12 (twelve) hours. 9 Syringe 0  . escitalopram (LEXAPRO) 20 MG tablet Take 1 tablet (20 mg total) by mouth daily. 30 tablet 1  . etodolac (LODINE) 500 MG tablet Take 500 mg by mouth 2 (two) times daily.    Marland Kitchen lidocaine (XYLOCAINE) 2 % jelly Apply 1 application topically as  needed (for pain).    Marland Kitchen oxybutynin (DITROPAN) 5 MG tablet Take 5 mg by mouth 2 (two) times daily.    . psyllium (METAMUCIL) 58.6 % powder Take 1 packet by mouth 2 (two) times daily as needed (for constipation).    . ranitidine (ZANTAC) 150 MG tablet Take 150 mg by mouth 2 (two) times daily.    Marland Kitchen senna (SENOKOT) 8.6 MG tablet Take 2 tablets by mouth 2 (two) times daily as needed for constipation.    . simvastatin (ZOCOR) 40 MG tablet Take 40 mg by mouth at bedtime.     . sucralfate (CARAFATE) 1 G tablet Take 1 tablet (1 g total) by mouth 3 (three) times daily. Dissolve tablet in 2-3 tbsp of warm water; swish and swallow. [place]  tablet 3  . tiotropium (SPIRIVA) 18 MCG inhalation capsule Place 18 mcg into inhaler and inhale daily.    Marland Kitchen warfarin (COUMADIN) 5 MG tablet Take 1 tablet (5 mg total) by mouth daily. 30 tablet 0  . zolpidem (AMBIEN) 10 MG tablet Take 1 tablet (10 mg total) by mouth at bedtime as needed for sleep. 30 tablet 1  . oxyCODONE-acetaminophen (PERCOCET) 10-325 MG tablet Take 1 tablet by mouth every 6 (six) hours as needed for pain. 60 tablet 0  . [DISCONTINUED] apixaban (ELIQUIS) 5 MG TABS tablet Take 1 tablet (5 mg total) by mouth 2 (two) times daily. 60 tablet 2   Current Facility-Administered Medications  Medication Dose Route Frequency Provider Last Rate Last Dose  . Influenza vac split quadrivalent PF (FLUARIX) injection 0.5 mL  0.5 mL Intramuscular Once Lequita Asal, MD       Facility-Administered Medications Ordered in Other Visits  Medication Dose Route Frequency Provider Last Rate Last Dose  . heparin lock flush 100 unit/mL  500 Units Intravenous Once Lequita Asal, MD      . sodium chloride 0.9 % injection 10 mL  10 mL Intravenous PRN Lequita Asal, MD        Review of Systems:  GENERAL:  Low energy.  No fevers or sweats.  Weight  fluctuating. PERFORMANCE STATUS (ECOG):  2 HEENT:  No visual changes, runny nose, mouth sores or sore throat. Lungs: No  shortness of breath or cough.  No hemoptysis. Cardiac:  No chest pain, palpitations, orthopnea, or PND. GI:  Colostomy.  Bowels "good".  Gets a little raw.  No nausea, vomiting, constipation, melena or hematochezia. GU:  No urgency, frequency, dysuria, or hematuria. Musculoskeletal:  No back pain.  No joint pain.  No muscle tenderness. Extremities:  No pain or swelling. Skin:  Improvement in per-rectal breakdown since hyperbaric oxygen. Neuro:  No headache, numbness or weakness, balance or coordination issues. Endocrine:  No diabetes, thyroid issues, hot flashes or night sweats. Psych:  No mood changes or depression.  Anxiety improved since start of treatment. Pain:  Decreased pain associated with anal mass.  Using oxycodone prn. Review of systems:  All other systems reviewed and found to be negative.   Physical Exam: Blood pressure 124/81, pulse 86, temperature 97 F (36.1 C), temperature source Tympanic, resp. rate 18, height 5' 6.5" (1.689 m), weight 190 lb 4.1 oz (86.3 kg).  GENERAL:  Chronically ill appearing woman lying on her side in the exam room in no acute distress.  MENTAL STATUS:  Alert and oriented to person, place and time. HEAD:  Long blonde/graying hair.  Normocephalic, atraumatic, face symmetric, no Cushingoid features. EYES:  Blue eyes.  Pupils equal round and reactive to light and accomodation.  No conjunctivitis or scleral icterus. ENT: No oral lesions.  Upper dentures.  Tongue normal. Mucous membranes moist.  RESPIRATORY:  Clear to auscultation without rales, wheezes or rhonchi. CARDIOVASCULAR:  Regular rate and rhythm without murmur, rub or gallop. CHEST WALL:  s/p port-a-cath removal. ABDOMEN:  Soft, non-tender, with active bowel sounds, and no hepatosplenomegaly.  No masses. RECTUM:  Perirectal linear breakdown approximately 4.5 x 1.5 cm. SKIN:  Perirectal wound (healing).  No other rashes, ulcers or lesions. EXTREMITIES: Mild left upper extremity edema.  No skin  discoloration or tenderness.  No palpable cords. LYMPH NODES: No palpable cervical, supraclavicular, axillary or inguinal adenopathy  NEUROLOGICAL: Unremarkable. PSYCH:  Appropriate.   No visits with results within 3 Day(s) from this visit.  Latest known visit with results is:  Admission on 10/22/2014, Discharged on 10/22/2014  Component Date Value Ref Range Status  . WBC 10/22/2014 7.6  3.6 - 11.0 K/uL Final  . RBC 10/22/2014 3.34* 3.80 - 5.20 MIL/uL Final  . Hemoglobin 10/22/2014 11.6* 12.0 - 16.0 g/dL Final  . HCT 10/22/2014 34.2* 35.0 - 47.0 % Final  . MCV 10/22/2014 102.5* 80.0 - 100.0 fL Final  . MCH 10/22/2014 34.8* 26.0 - 34.0 pg Final  . MCHC 10/22/2014 33.9  32.0 - 36.0 g/dL Final  . RDW 10/22/2014 14.9* 11.5 - 14.5 % Final  . Platelets 10/22/2014 135* 150 - 440 K/uL Final  . Neutrophils Relative % 10/22/2014 73   Final  . Neutro Abs 10/22/2014 5.6  1.4 - 6.5 K/uL Final  . Lymphocytes Relative 10/22/2014 11   Final  . Lymphs Abs 10/22/2014 0.9* 1.0 - 3.6 K/uL Final  . Monocytes Relative 10/22/2014 8   Final  . Monocytes Absolute 10/22/2014 0.6  0.2 - 0.9 K/uL Final  . Eosinophils Relative 10/22/2014 7   Final  . Eosinophils Absolute 10/22/2014 0.5  0 - 0.7 K/uL Final  . Basophils Relative 10/22/2014 1   Final  . Basophils Absolute 10/22/2014 0.1  0 - 0.1 K/uL Final  . Sodium 10/22/2014 138  135 - 145 mmol/L Final  . Potassium 10/22/2014 3.3* 3.5 - 5.1 mmol/L Final  . Chloride 10/22/2014 105  101 - 111 mmol/L Final  . CO2 10/22/2014 29  22 - 32 mmol/L Final  . Glucose, Bld 10/22/2014 95  65 - 99 mg/dL Final  . BUN 10/22/2014 10  6 - 20 mg/dL Final  . Creatinine, Ser 10/22/2014 0.68  0.44 - 1.00 mg/dL Final  . Calcium 10/22/2014 8.6* 8.9 - 10.3 mg/dL Final  . Total Protein 10/22/2014 6.1* 6.5 - 8.1 g/dL Final  . Albumin 10/22/2014 2.8* 3.5 - 5.0 g/dL Final  . AST 10/22/2014 23  15 - 41 U/L Final  . ALT 10/22/2014 10* 14 - 54 U/L Final  . Alkaline Phosphatase 10/22/2014  60  38 - 126 U/L Final  . Total Bilirubin 10/22/2014 0.4  0.3 - 1.2 mg/dL Final  . GFR calc non Af Amer 10/22/2014 >60  >60 mL/min Final  . GFR calc Af Amer 10/22/2014 >60  >60 mL/min Final   Comment: (NOTE) The eGFR has been calculated using the CKD EPI equation. This calculation has not been validated in all clinical situations. eGFR's persistently <60 mL/min signify possible Chronic Kidney Disease.   . Anion gap 10/22/2014 4* 5 - 15 Final  . Troponin I 10/22/2014 <0.03  <0.031 ng/mL Final   Comment:        NO INDICATION OF MYOCARDIAL INJURY.   . Prothrombin Time 10/22/2014 14.7  11.4 - 15.0 seconds Final  . INR 10/22/2014 1.13   Final    Assessment:  [name] is a 59 y.o. 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  Chest, abdomen, and pelvic CT scan on 05/19/2014 revealed no evidence of metastatic disease.  There was a 4 mm RUL pulmonary nodule.  There were borderline (13 mm) enlarged iliac nodes.  There was a 4.7 x 4.1 x 6.0 cm lower rectal and anal mass consistent with anal cancer.  She underwent diverting colostomy on 05/26/2014.    She is currently 4 months following initiation  of concurrent chemotherapy (5FU and mitomycin-C) and radiation (06/29/2014 - 07/31/2014).  She missed 3 days of radiation (07/04-07/06/2014).  She has been on a radiation break since 08/23/2014 secondary to perirectal breakdown.  She began hyperbaric oxygen on 09/13/2014 (on hold since thrombosis).  She has macrocytic RBC indices.  Work-up on 09/28/2014 revealed a B12 deficiency (169) and folate deficiency (5.2).  TSH was normal.  She has not started folic acid secondary to costs.  She developed a left upper extremity DVT on 10/14/2014.  She underwent thrombolysis, thrombectomy, and port removal on 10/16/2014.  She is on Lovenox and Coumadin  (followed by vascular surgery).  Symptomatically, she is doing well.  Exam reveals dramatic improvement since hyperbaric oxygen.  Plan: 1.  Labs today:  CBC with diff, BMP.. 2.  Begin weekly B12 x 6 then monthly. 3.  Encourage patient to pick up folic acid at pharmacy. 4.  Patient to meet with Barnabas Lister, Education officer, museum, to help with medications. 5.  Phone follow-up with Dr. Lucky Cowboy of vascular surgery re: left upper extremity DVT. 6.  Continue Coumadin and Lovenox BID.  Phone follow-up regarding INR. 7.  Rx:  Oxycodone 24m/Tylenol 325 mg 1 tab po q 6 hours prn pain; dis: # 60. 8.  Influenza vaccine. 9.  RTC in 4 weeks for MD assess and labs (CBC with diff, CMP, PT/INR).   MLequita Asal MD  10/30/2014, 10:04 AM

## 2014-10-30 NOTE — Telephone Encounter (Signed)
Called pt x 2 to cell phone to make pt aware of Folic acid called in.  Pt does not answer will continue to try

## 2014-10-31 ENCOUNTER — Inpatient Hospital Stay: Payer: Commercial Managed Care - HMO

## 2014-10-31 ENCOUNTER — Other Ambulatory Visit: Payer: Self-pay | Admitting: *Deleted

## 2014-10-31 NOTE — Patient Outreach (Signed)
Final attempt made to contact pt following hospital discharge 10/11, ED visit 10/16.     HIPPA compliant voice message left with contact number.  If no response, unable to contact letter to be sent out and if no response to letter in 10 days, plan to close case.     [name].   [facility]  [phone]

## 2014-11-01 ENCOUNTER — Other Ambulatory Visit: Payer: Self-pay | Admitting: *Deleted

## 2014-11-01 ENCOUNTER — Inpatient Hospital Stay: Payer: Commercial Managed Care - HMO

## 2014-11-01 NOTE — Patient Outreach (Signed)
Attempt made to contact pt as received a return phone call from voice message left by RN CM yesterday.  HIPPA compliant voice message left with contact number.  With yesterday's call being final attempt to contact pt, if no response to f/u call today, will plan to send unable to contact letter- if no response to letter after 10 days - will close case.     [name]   [place] [facility]  [phone]

## 2014-11-02 ENCOUNTER — Inpatient Hospital Stay: Payer: Commercial Managed Care - HMO

## 2014-11-03 ENCOUNTER — Inpatient Hospital Stay: Payer: Commercial Managed Care - HMO

## 2014-11-03 ENCOUNTER — Other Ambulatory Visit: Payer: Commercial Managed Care - HMO

## 2014-11-03 ENCOUNTER — Ambulatory Visit: Payer: Commercial Managed Care - HMO

## 2014-11-06 ENCOUNTER — Inpatient Hospital Stay: Payer: Commercial Managed Care - HMO

## 2014-11-06 ENCOUNTER — Encounter: Payer: Self-pay | Admitting: *Deleted

## 2014-11-06 NOTE — Patient Outreach (Signed)
[facility]) Care Management  11/06/2014  [name] 1955-03-12 [id number]   Documentation:  3  attempts made  to contact pt as part of transition of care, pt called after third attempt and  left a voice message.  Called pt back but  no response.  Unable to contact pt will be sent out and if no response in 10 days, plan to discharge.      [name]   [place] [facility]  [phone]

## 2014-11-07 ENCOUNTER — Inpatient Hospital Stay: Payer: Commercial Managed Care - HMO | Attending: Hematology and Oncology

## 2014-11-07 ENCOUNTER — Inpatient Hospital Stay: Payer: Commercial Managed Care - HMO

## 2014-11-07 ENCOUNTER — Other Ambulatory Visit: Payer: Self-pay

## 2014-11-07 DIAGNOSIS — Z79899 Other long term (current) drug therapy: Secondary | ICD-10-CM | POA: Insufficient documentation

## 2014-11-07 DIAGNOSIS — C210 Malignant neoplasm of anus, unspecified: Secondary | ICD-10-CM | POA: Insufficient documentation

## 2014-11-08 ENCOUNTER — Inpatient Hospital Stay: Payer: Commercial Managed Care - HMO

## 2014-11-09 ENCOUNTER — Inpatient Hospital Stay: Payer: Commercial Managed Care - HMO

## 2014-11-10 ENCOUNTER — Inpatient Hospital Stay: Payer: Commercial Managed Care - HMO

## 2014-11-10 ENCOUNTER — Ambulatory Visit: Payer: Commercial Managed Care - HMO

## 2014-11-10 ENCOUNTER — Other Ambulatory Visit: Payer: Commercial Managed Care - HMO

## 2014-11-10 ENCOUNTER — Ambulatory Visit: Payer: Commercial Managed Care - HMO | Admitting: Surgery

## 2014-11-13 ENCOUNTER — Inpatient Hospital Stay: Payer: Commercial Managed Care - HMO

## 2014-11-13 ENCOUNTER — Other Ambulatory Visit: Payer: Self-pay | Admitting: Hematology and Oncology

## 2014-11-13 ENCOUNTER — Telehealth: Payer: Self-pay | Admitting: *Deleted

## 2014-11-13 MED ORDER — OXYCODONE-ACETAMINOPHEN 10-325 MG PO TABS: 1.0000 | ORAL_TABLET | Freq: Four times a day (QID) | ORAL | Status: DC | PRN

## 2014-11-13 NOTE — Telephone Encounter (Signed)
Informed that prescription is ready to pick up  

## 2014-11-14 ENCOUNTER — Inpatient Hospital Stay: Payer: Commercial Managed Care - HMO

## 2014-11-15 ENCOUNTER — Inpatient Hospital Stay: Payer: Commercial Managed Care - HMO

## 2014-11-16 ENCOUNTER — Inpatient Hospital Stay: Payer: Commercial Managed Care - HMO

## 2014-11-16 ENCOUNTER — Other Ambulatory Visit: Payer: Self-pay | Admitting: Hematology and Oncology

## 2014-11-16 DIAGNOSIS — Z79899 Other long term (current) drug therapy: Secondary | ICD-10-CM | POA: Diagnosis not present

## 2014-11-16 DIAGNOSIS — E538 Deficiency of other specified B group vitamins: Secondary | ICD-10-CM

## 2014-11-16 DIAGNOSIS — C210 Malignant neoplasm of anus, unspecified: Secondary | ICD-10-CM | POA: Diagnosis present

## 2014-11-16 MED ORDER — CYANOCOBALAMIN 1000 MCG/ML IJ SOLN
1000.0000 ug | Freq: Once | INTRAMUSCULAR | Status: AC
  Administered 2014-11-16: 1000 ug via INTRAMUSCULAR
  Filled 2014-11-16: qty 1

## 2014-11-17 ENCOUNTER — Inpatient Hospital Stay: Payer: Commercial Managed Care - HMO

## 2014-11-17 ENCOUNTER — Ambulatory Visit: Payer: Commercial Managed Care - HMO | Admitting: Psychiatry

## 2014-11-17 ENCOUNTER — Other Ambulatory Visit: Payer: Commercial Managed Care - HMO

## 2014-11-17 ENCOUNTER — Other Ambulatory Visit: Payer: Self-pay | Admitting: *Deleted

## 2014-11-17 ENCOUNTER — Encounter: Payer: Self-pay | Admitting: *Deleted

## 2014-11-17 ENCOUNTER — Ambulatory Visit: Payer: Commercial Managed Care - HMO

## 2014-11-17 NOTE — Patient Outreach (Signed)
Plan to close pt's case - unable to contact following hospitalization, letter (unable to contact) sent out, no response.    Closure letter sent to Dr. Humphrey Rolls. Closure letter sent to pt.   Plan to sent request to [facility] Case management assistant to close case.   [name]   [place] [facility]  [phone]

## 2014-11-20 ENCOUNTER — Inpatient Hospital Stay: Payer: Commercial Managed Care - HMO

## 2014-11-21 ENCOUNTER — Inpatient Hospital Stay: Payer: Commercial Managed Care - HMO

## 2014-11-21 ENCOUNTER — Ambulatory Visit: Payer: Commercial Managed Care - HMO | Admitting: Surgery

## 2014-11-22 ENCOUNTER — Inpatient Hospital Stay: Payer: Commercial Managed Care - HMO

## 2014-11-22 ENCOUNTER — Encounter: Payer: Self-pay | Admitting: Psychiatry

## 2014-11-22 ENCOUNTER — Ambulatory Visit: Payer: Commercial Managed Care - HMO | Admitting: Psychiatry

## 2014-11-22 ENCOUNTER — Ambulatory Visit (INDEPENDENT_AMBULATORY_CARE_PROVIDER_SITE_OTHER): Payer: Commercial Managed Care - HMO | Admitting: Psychiatry

## 2014-11-22 VITALS — BP 140/82 | HR 81 | Temp 97.3°F | Ht 66.5 in | Wt 196.0 lb

## 2014-11-22 DIAGNOSIS — F332 Major depressive disorder, recurrent severe without psychotic features: Secondary | ICD-10-CM | POA: Diagnosis not present

## 2014-11-22 MED ORDER — ARIPIPRAZOLE 2 MG PO TABS: 2.0000 mg | ORAL_TABLET | Freq: Every day | ORAL | Status: DC

## 2014-11-22 MED ORDER — ESCITALOPRAM OXALATE 20 MG PO TABS: 20.0000 mg | ORAL_TABLET | Freq: Every day | ORAL | Status: DC

## 2014-11-22 MED ORDER — DULOXETINE HCL 60 MG PO CPEP: 60.0000 mg | ORAL_CAPSULE | Freq: Every day | ORAL | Status: DC

## 2014-11-22 MED ORDER — CLONAZEPAM 1 MG PO TABS: 1.0000 mg | ORAL_TABLET | Freq: Three times a day (TID) | ORAL | Status: DC | PRN

## 2014-11-22 MED ORDER — ZOLPIDEM TARTRATE 10 MG PO TABS: 10.0000 mg | ORAL_TABLET | Freq: Every evening | ORAL | Status: DC | PRN

## 2014-11-22 NOTE — Progress Notes (Signed)
[place] MD/PA/NP OP Progress Note  11/22/2014 1:36 PM [name]  MRN:  [id number]  Subjective:  Patient returns a follow-up of her major depressive disorder, recurrent, severe without psychotic features. At the last visit we had continued her on her Lexapro, duloxetine, Klonopin and Ambien. However we did add Abilify 2 mg in the morning. Patient states she has noticed a significant benefit with the addition of the Abilify. She states that she is less depressed, she is enjoying things and she is more active. She states she also feels like she can get out and about in public more whereas in the past crowds and things could sometimes stirrup anxiety.  [name] she states she and her husband got back together and she states he's more tolerable now that he is not drinking. She states ultimately they have plans to move from the rooming house where she is to some other type of housing. Chief Complaint: Much better with the medication Chief Complaint    Follow-up; Medication Refill     Visit Diagnosis:  No diagnosis found.  Past Medical History:  Past Medical History  Diagnosis Date  . Schizophrenia ([place])   . Asthma   . GERD (gastroesophageal reflux disease)   . Anxiety   . Depression   . Bipolar disorder ([place])   . COPD (chronic obstructive pulmonary disease) ([place])   . Occasional tremors     right hand  . PTSD (post-traumatic stress disorder)   . Shortness of breath dyspnea   . Fibromyalgia   . DVT (deep venous thrombosis) ([place]) 2011    RUE  . Thyroid nodule   . DDD (degenerative disc disease), lumbar   . Spinal stenosis   . Peripheral neuropathy ([place])   . Rotator cuff tear     right  . Pneumonia 2011  . Hypothyroidism     no meds currently  . Anemia     during pregnancy only  . Hypertension     Off meds x 15 years-well controlled now per pt  . Squamous cell cancer, anus ([facility])   . Severe obstructive sleep apnea 06/27/2014  . DVT of upper extremity (deep vein thrombosis) ([place])  10/14/2014    Past Surgical History  Procedure Laterality Date  . Foot surgery Right   . Tubal ligation    . Eye surgery Bilateral   . Mouth surgery  2002  . Rectal biopsy N/A 05/08/2014    Procedure: BIOPSY RECTAL;  Surgeon: Marlyce Huge, MD;  Location: [facility];  Service: General;  Laterality: N/A;  . Evaluation under anesthesia with hemorrhoidectomy N/A 05/08/2014    Procedure: EXAM UNDER ANESTHESIA WITH HEMORRHOIDECTOMY;  Surgeon: Marlyce Huge, MD;  Location: [facility];  Service: General;  Laterality: N/A;  . Portacath placement N/A 05/20/2014    Procedure: INSERTION PORT-A-CATH;  Surgeon: Florene Glen, MD;  Location: [facility];  Service: General;  Laterality: N/A;  . Laparoscopic diverted colostomy N/A 05/26/2014    Procedure: LAPAROSCOPIC DIVERTED COLOSTOMY;  Surgeon: Marlyce Huge, MD;  Location: [facility];  Service: General;  Laterality: N/A;  . Peripheral vascular catheterization Left 10/16/2014    Procedure: Upper Extremity Venography with thrombectomy, port removal;  Surgeon: Algernon Huxley, MD;  Location: [place] [facility];  Service: Cardiovascular;  Laterality: Left;  . Peripheral vascular catheterization  10/16/2014    Procedure: Upper Extremity Intervention;  Surgeon: Algernon Huxley, MD;  Location: [place] [facility];  Service: Cardiovascular;;   Family History:  Family History  Problem Relation  Age of Onset  . Cancer Maternal Aunt   . Cancer Paternal Uncle   . Diabetes Mother   . Thyroid disease Mother   . Kidney failure Mother   . Hypertension Mother   . Depression Mother    Social History:  Social History   Social History  . Marital Status: Legally Separated    Spouse Name: N/A  . Number of Children: N/A  . Years of Education: N/A   Social History Main Topics  . Smoking status: Current Every Day Smoker -- 0.25 packs/day for 44 years    Types: Cigarettes    Start date: 10/04/1970  . Smokeless tobacco: Never Used  . Alcohol Use: No      Comment: 1 drink every 2-3 times/year  . 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 Sexual Activity: No   Other Topics Concern  . None   Social History Narrative   Additional History:   Assessment:   Musculoskeletal: Strength & Muscle Tone: within normal limits Gait & Station: normal Patient leans: N/A  Psychiatric Specialty Exam: HPI  Review of Systems  Psychiatric/Behavioral: Negative for depression, suicidal ideas, hallucinations, memory loss and substance abuse. The patient is not nervous/anxious and does not have insomnia.   All other systems reviewed and are negative.   Blood pressure 140/82, pulse 81, temperature 97.3 F (36.3 C), temperature source Tympanic, height 5' 6.5" (1.689 m), weight 196 lb (88.905 kg), SpO2 88 %.Body mass index is 31.16 kg/(m^2).  General Appearance: Well Groomed  Eye Contact:  Good  Speech:  Normal Rate  Volume:  Normal  Mood:  Better  Affect:  Brighter  Thought Process:  Linear  Orientation:  Full (Time, Place, and Person)  Thought Content:  Negative  Suicidal Thoughts:  No  Homicidal Thoughts:  No  Memory:  Immediate;   Good Recent;   Good Remote;   Good  Judgement:  Good  Insight:  Good  Psychomotor Activity:  Negative  Concentration:  Good  Recall:  Good  Fund of Knowledge: Good  Language: Good  Akathisia:  Negative  Handed:   AIMS (if indicated):   Assets:  Communication Skills Desire for Improvement Social Support  ADL's:  Intact  Cognition: WNL  Sleep:  Good    Is the patient at risk to self?  No. Has the patient been a risk to self in the past 6 months?  No. Has the patient been a risk to self within the distant past?  Yes.   Is the patient a risk to others?  No. Has the patient been a risk to others in the past 6 months?  No. Has the patient been a risk to others within the distant past?  No.  Current Medications: Current Outpatient Prescriptions   Medication Sig Dispense Refill  . albuterol (PROVENTIL HFA;VENTOLIN HFA) 108 (90 BASE) MCG/ACT inhaler Inhale 1 puff into the lungs every 6 (six) hours as needed for wheezing or shortness of breath.    . ARIPiprazole (ABILIFY) 2 MG tablet Take 1 tablet (2 mg total) by mouth daily. 30 tablet 2  . budesonide-formoterol (SYMBICORT) 160-4.5 MCG/ACT inhaler Inhale 2 puffs into the lungs 2 (two) times daily. 1 Inhaler 12  . Calcium Carb-Cholecalciferol (CALCIUM 600+D) 600-800 MG-UNIT TABS Take 1 tablet by mouth 2 (two) times daily.     . clonazePAM (KLONOPIN) 1 MG tablet Take 1 tablet (1 mg total) by  mouth 3 (three) times daily as needed for anxiety. 90 tablet 2  . DULoxetine (CYMBALTA) 60 MG capsule Take 1 capsule (60 mg total) by mouth daily. 30 capsule 2  . escitalopram (LEXAPRO) 20 MG tablet Take 1 tablet (20 mg total) by mouth daily. 30 tablet 2  . oxybutynin (DITROPAN) 5 MG tablet Take 5 mg by mouth 2 (two) times daily.    Marland Kitchen oxyCODONE-acetaminophen (PERCOCET) 10-325 MG tablet Take 1 tablet by mouth every 6 (six) hours as needed for pain. 60 tablet 0  . ranitidine (ZANTAC) 150 MG tablet Take 150 mg by mouth 2 (two) times daily.    Marland Kitchen senna (SENOKOT) 8.6 MG tablet Take 2 tablets by mouth 2 (two) times daily as needed for constipation.    . simvastatin (ZOCOR) 40 MG tablet Take 40 mg by mouth at bedtime.     . sucralfate (CARAFATE) 1 G tablet Take 1 tablet (1 g total) by mouth 3 (three) times daily. Dissolve tablet in 2-3 tbsp of warm water; swish and swallow. 90 tablet 3  . tiotropium (SPIRIVA) 18 MCG inhalation capsule Place 18 mcg into inhaler and inhale daily.    . traMADol (ULTRAM) 50 MG tablet     . warfarin (COUMADIN) 5 MG tablet Take 1 tablet (5 mg total) by mouth daily. 30 tablet 0  . zolpidem (AMBIEN) 10 MG tablet Take 1 tablet (10 mg total) by mouth at bedtime as needed for sleep. 30 tablet 2  . [DISCONTINUED] apixaban (ELIQUIS) 5 MG TABS tablet Take 1 tablet (5 mg total) by mouth 2 (two)  times daily. 60 tablet 2   No current facility-administered medications for this visit.   Facility-Administered Medications Ordered in Other Visits  Medication Dose Route Frequency Provider Last Rate Last Dose  . heparin lock flush 100 unit/mL  500 Units Intravenous Once Lequita Asal, MD      . sodium chloride 0.9 % injection 10 mL  10 mL Intravenous PRN Lequita Asal, MD        Medical Decision Making:  Established Problem, Stable/Improving (1) and Review of Medication Regimen & Side Effects (2)  Treatment Plan Summary:Medication management and Plan  Major depressive disorder, recurrent, severe without psychotic features-patient reports improvement with Abilify. we will continue the patient's Lexapro 20 mg a day, continue her duloxetine at 60 mg daily, we'll continue her Klonopin at 1 mg 3 times a day as needed. She'll continue her Ambien 10 millions of bedtime as needed. She is reporting benefit from the addition of Abilify 2 mg. Continue her medications without any changes.  Again discussed with her metabolic issues related to the Abilify. She states she does not recall getting any kind of slip to have metabolic labs at her last point. I've given her another prescription with metabolic labs and prolactin level ordered. She has a appointment with her primary care physician on 12/12/2014 and she states she can take the prescriptions with the labs to that appointment and hopes to getting them done there.     Faith Rogue 11/22/2014, 1:36 PM

## 2014-11-22 NOTE — Patient Instructions (Addendum)
Have metabolic labs done. If possible have them done at PCP office at your appointment on December 12, 2014. Provided slip with lab orders.

## 2014-11-23 ENCOUNTER — Inpatient Hospital Stay: Payer: Commercial Managed Care - HMO

## 2014-11-23 ENCOUNTER — Encounter: Payer: Self-pay | Admitting: Hematology and Oncology

## 2014-11-23 DIAGNOSIS — C210 Malignant neoplasm of anus, unspecified: Secondary | ICD-10-CM | POA: Diagnosis not present

## 2014-11-23 DIAGNOSIS — E538 Deficiency of other specified B group vitamins: Secondary | ICD-10-CM

## 2014-11-23 MED ORDER — CYANOCOBALAMIN 1000 MCG/ML IJ SOLN
1000.0000 ug | Freq: Once | INTRAMUSCULAR | Status: AC
  Administered 2014-11-23: 1000 ug via INTRAMUSCULAR
  Filled 2014-11-23: qty 1

## 2014-11-24 ENCOUNTER — Inpatient Hospital Stay: Payer: Commercial Managed Care - HMO

## 2014-11-24 ENCOUNTER — Other Ambulatory Visit: Payer: Commercial Managed Care - HMO

## 2014-11-24 ENCOUNTER — Ambulatory Visit: Payer: Commercial Managed Care - HMO | Admitting: Hematology and Oncology

## 2014-11-27 ENCOUNTER — Inpatient Hospital Stay: Payer: Commercial Managed Care - HMO

## 2014-11-27 ENCOUNTER — Other Ambulatory Visit: Payer: Commercial Managed Care - HMO

## 2014-11-27 ENCOUNTER — Ambulatory Visit: Payer: Commercial Managed Care - HMO | Admitting: Hematology and Oncology

## 2014-11-27 ENCOUNTER — Ambulatory Visit: Payer: Commercial Managed Care - HMO

## 2014-11-27 ENCOUNTER — Telehealth: Payer: Self-pay | Admitting: *Deleted

## 2014-11-27 MED ORDER — OXYCODONE-ACETAMINOPHEN 10-325 MG PO TABS: 1.0000 | ORAL_TABLET | Freq: Four times a day (QID) | ORAL | Status: DC | PRN

## 2014-11-27 NOTE — Telephone Encounter (Signed)
Informed that prescription is ready to pick up  

## 2014-11-28 ENCOUNTER — Inpatient Hospital Stay: Payer: Commercial Managed Care - HMO

## 2014-11-29 ENCOUNTER — Inpatient Hospital Stay: Payer: Commercial Managed Care - HMO

## 2014-11-29 ENCOUNTER — Ambulatory Visit: Payer: Commercial Managed Care - HMO

## 2014-11-30 ENCOUNTER — Inpatient Hospital Stay: Payer: Commercial Managed Care - HMO

## 2014-12-01 ENCOUNTER — Inpatient Hospital Stay: Payer: Commercial Managed Care - HMO

## 2014-12-04 ENCOUNTER — Inpatient Hospital Stay: Payer: Commercial Managed Care - HMO

## 2014-12-04 ENCOUNTER — Ambulatory Visit: Payer: Commercial Managed Care - HMO | Admitting: Radiation Oncology

## 2014-12-05 ENCOUNTER — Inpatient Hospital Stay: Payer: Commercial Managed Care - HMO

## 2014-12-06 ENCOUNTER — Inpatient Hospital Stay: Payer: Commercial Managed Care - HMO

## 2014-12-07 ENCOUNTER — Inpatient Hospital Stay: Payer: Commercial Managed Care - HMO

## 2014-12-07 ENCOUNTER — Inpatient Hospital Stay: Payer: Commercial Managed Care - HMO | Attending: Hematology and Oncology

## 2014-12-07 ENCOUNTER — Other Ambulatory Visit: Payer: Self-pay | Admitting: Hematology and Oncology

## 2014-12-07 DIAGNOSIS — Z808 Family history of malignant neoplasm of other organs or systems: Secondary | ICD-10-CM | POA: Insufficient documentation

## 2014-12-07 DIAGNOSIS — Z7901 Long term (current) use of anticoagulants: Secondary | ICD-10-CM | POA: Insufficient documentation

## 2014-12-07 DIAGNOSIS — N941 Unspecified dyspareunia: Secondary | ICD-10-CM | POA: Insufficient documentation

## 2014-12-07 DIAGNOSIS — G4733 Obstructive sleep apnea (adult) (pediatric): Secondary | ICD-10-CM | POA: Insufficient documentation

## 2014-12-07 DIAGNOSIS — I82629 Acute embolism and thrombosis of deep veins of unspecified upper extremity: Secondary | ICD-10-CM

## 2014-12-07 DIAGNOSIS — E039 Hypothyroidism, unspecified: Secondary | ICD-10-CM | POA: Insufficient documentation

## 2014-12-07 DIAGNOSIS — Z8701 Personal history of pneumonia (recurrent): Secondary | ICD-10-CM | POA: Insufficient documentation

## 2014-12-07 DIAGNOSIS — K6289 Other specified diseases of anus and rectum: Secondary | ICD-10-CM | POA: Insufficient documentation

## 2014-12-07 DIAGNOSIS — D649 Anemia, unspecified: Secondary | ICD-10-CM | POA: Insufficient documentation

## 2014-12-07 DIAGNOSIS — R251 Tremor, unspecified: Secondary | ICD-10-CM | POA: Insufficient documentation

## 2014-12-07 DIAGNOSIS — C210 Malignant neoplasm of anus, unspecified: Secondary | ICD-10-CM

## 2014-12-07 DIAGNOSIS — F419 Anxiety disorder, unspecified: Secondary | ICD-10-CM | POA: Insufficient documentation

## 2014-12-07 DIAGNOSIS — R300 Dysuria: Secondary | ICD-10-CM | POA: Insufficient documentation

## 2014-12-07 DIAGNOSIS — J45909 Unspecified asthma, uncomplicated: Secondary | ICD-10-CM | POA: Insufficient documentation

## 2014-12-07 DIAGNOSIS — J449 Chronic obstructive pulmonary disease, unspecified: Secondary | ICD-10-CM | POA: Insufficient documentation

## 2014-12-07 DIAGNOSIS — M5136 Other intervertebral disc degeneration, lumbar region: Secondary | ICD-10-CM | POA: Insufficient documentation

## 2014-12-07 DIAGNOSIS — M4800 Spinal stenosis, site unspecified: Secondary | ICD-10-CM | POA: Insufficient documentation

## 2014-12-07 DIAGNOSIS — F17210 Nicotine dependence, cigarettes, uncomplicated: Secondary | ICD-10-CM | POA: Insufficient documentation

## 2014-12-07 DIAGNOSIS — F319 Bipolar disorder, unspecified: Secondary | ICD-10-CM | POA: Insufficient documentation

## 2014-12-07 DIAGNOSIS — E538 Deficiency of other specified B group vitamins: Secondary | ICD-10-CM | POA: Insufficient documentation

## 2014-12-07 DIAGNOSIS — I10 Essential (primary) hypertension: Secondary | ICD-10-CM | POA: Insufficient documentation

## 2014-12-07 DIAGNOSIS — Z79899 Other long term (current) drug therapy: Secondary | ICD-10-CM | POA: Insufficient documentation

## 2014-12-07 DIAGNOSIS — F209 Schizophrenia, unspecified: Secondary | ICD-10-CM | POA: Insufficient documentation

## 2014-12-07 DIAGNOSIS — G629 Polyneuropathy, unspecified: Secondary | ICD-10-CM | POA: Insufficient documentation

## 2014-12-07 DIAGNOSIS — C20 Malignant neoplasm of rectum: Secondary | ICD-10-CM | POA: Insufficient documentation

## 2014-12-07 DIAGNOSIS — R0602 Shortness of breath: Secondary | ICD-10-CM | POA: Insufficient documentation

## 2014-12-07 DIAGNOSIS — I82622 Acute embolism and thrombosis of deep veins of left upper extremity: Secondary | ICD-10-CM | POA: Insufficient documentation

## 2014-12-07 DIAGNOSIS — M797 Fibromyalgia: Secondary | ICD-10-CM | POA: Insufficient documentation

## 2014-12-07 DIAGNOSIS — F329 Major depressive disorder, single episode, unspecified: Secondary | ICD-10-CM | POA: Insufficient documentation

## 2014-12-07 DIAGNOSIS — F4310 Post-traumatic stress disorder, unspecified: Secondary | ICD-10-CM | POA: Insufficient documentation

## 2014-12-07 DIAGNOSIS — K219 Gastro-esophageal reflux disease without esophagitis: Secondary | ICD-10-CM | POA: Insufficient documentation

## 2014-12-08 ENCOUNTER — Inpatient Hospital Stay: Payer: Commercial Managed Care - HMO

## 2014-12-11 ENCOUNTER — Other Ambulatory Visit: Payer: Self-pay | Admitting: *Deleted

## 2014-12-11 ENCOUNTER — Inpatient Hospital Stay: Payer: Commercial Managed Care - HMO

## 2014-12-11 ENCOUNTER — Ambulatory Visit: Payer: Commercial Managed Care - HMO

## 2014-12-11 ENCOUNTER — Ambulatory Visit: Payer: Commercial Managed Care - HMO | Admitting: Radiation Oncology

## 2014-12-11 MED ORDER — OXYCODONE-ACETAMINOPHEN 10-325 MG PO TABS: 1.0000 | ORAL_TABLET | Freq: Four times a day (QID) | ORAL | Status: DC | PRN

## 2014-12-11 NOTE — Telephone Encounter (Signed)
Informed that prescription is ready to pick up Left message on VM 

## 2014-12-12 ENCOUNTER — Inpatient Hospital Stay: Payer: Commercial Managed Care - HMO

## 2014-12-12 ENCOUNTER — Encounter: Payer: Self-pay | Admitting: Unknown Physician Specialty

## 2014-12-12 ENCOUNTER — Ambulatory Visit (INDEPENDENT_AMBULATORY_CARE_PROVIDER_SITE_OTHER): Payer: Commercial Managed Care - HMO | Admitting: Unknown Physician Specialty

## 2014-12-12 VITALS — BP 137/83 | HR 78 | Temp 98.3°F | Ht 66.5 in | Wt 194.2 lb

## 2014-12-12 DIAGNOSIS — M797 Fibromyalgia: Secondary | ICD-10-CM | POA: Diagnosis not present

## 2014-12-12 DIAGNOSIS — I8290 Acute embolism and thrombosis of unspecified vein: Secondary | ICD-10-CM

## 2014-12-12 DIAGNOSIS — I10 Essential (primary) hypertension: Secondary | ICD-10-CM | POA: Diagnosis not present

## 2014-12-12 DIAGNOSIS — E785 Hyperlipidemia, unspecified: Secondary | ICD-10-CM | POA: Diagnosis not present

## 2014-12-12 DIAGNOSIS — J449 Chronic obstructive pulmonary disease, unspecified: Secondary | ICD-10-CM | POA: Diagnosis not present

## 2014-12-12 MED ORDER — PREGABALIN 150 MG PO CAPS: 150.0000 mg | ORAL_CAPSULE | Freq: Two times a day (BID) | ORAL | Status: DC

## 2014-12-12 NOTE — Patient Instructions (Signed)
f you have chronic pain and are looking for alternatives to medication and surgery, you have a lot of options.   However, not all alternative pain treatments work. Some can even be risky. Some alternative treatments may help with pain from bad backs, osteoarthritis, and headaches, but have no effect on chronic pain from fibromyalgia or diabetic nerve damage.  Here's a rundown of the most commonly used alternative treatments for chronic pain.  Acupuncture. Once seen as bizarre, acupuncture is rapidly becoming a mainstream treatment for pain. Studies have found that it works for pain caused by many conditions, including fibromyalgia, osteoarthritis, back injuries, and sports injuries. How does it work? No one's quite sure. It could release pain-numbing chemicals in the body. Or it might block the pain signals coming from the nerves.  Exercise. Motion is lotion Going for a walk or swim isn't a treatment, exactly. But regular physical activity has big benefits for people with many different painful conditions. Study after study has found that physical activity can help relieve chronic pain, as well as boost energy and mood.   This is particularly true of pain related to arthritis .    Chiropractic manipulation. Although mainstream medicine has traditionally regarded spinal manipulation with suspicion, it's becoming a more accepted treatment.  I think many people respond will th chiropractic treatment.    Supplements and vitamins. There is evidence that certain dietary supplements and vitamins can help with certain types of pain. Fish oil and flaxseed oil is often used to reduce pain associated with swelling. Topical capsaicin, derived from chili peppers, may help with arthritis, diabetic nerve pain, and other conditions. There's evidence that glucosamine can help relieve moderate to severe pain from osteoarthritis in the knee.  A spice Tumeric is used my many to treat chronic pain.  Curcumin is the active  ingredient and thought to have anti-inflammatory effects and reduces pain and stiffness.  This can be found as capsules or extract (more likely to be free of contaminants). For OA: Capsule, typically 400 mg to 600 mg, three times per day; or 0.5 g to 1 g of powdered root up to 3 g per day. For RA: 500 mg twice daily.  It's best absorbed if it contains black pepper.  Tart cherry juice is a natural anti-inflammatory agent.  We sometimes hear from people who would like to know where to find cherries out of season. Some report that their local market does not carry cherry juice. There are a number of reputable online vendors who could supply cherry concentrate so you can use cherry juice to see if it helps your arthritic joints. Studies have been done with powdered [place] cherries, CherryPURE. The usual dose is 480 mg/day.  But when it comes to supplements, you have to be careful. High doses of B6 can damage the nerves.   Some studies suggest that supplements such as ginkgo biloba and ginseng can thin the blood and increase the risk of bleeding. This could lead to serious consequences for anyone getting surgery for chronic pain.  Finally, supplements don't always contain what they claim as supplement manufacturers are not regulated by the FDA.  I get very confused with the thousands of supplements available and which to choose.  Brands to consider: [employer], Nature[employer], [employer], Garden of Life, [employer], Duwayne Heck and Tresa Garter   I usually go on [employer] and look for the highly rated products.  [employer] or [employer] are great resources and have done [employer]  for you.     Cognitive Behavioral Therapy. Some people with chronic pain balk at the idea of seeing a therapist -- they think it implies that their pain isn't real. But studies show that depression and chronic pain often go together. Chronic pain can cause or worsen depression; depression can lower a person's tolerance for  pain.  Stress-reduction techniques. There are number of approaches, including: Yoga. There's good evidence that yoga can help with chronic pain,  specifically fibromyalgia, neck pain, back pain, and arthritis. Relaxation therapy. This is actually a category of techniques that help people calm the body and release tension -- a process that might also reduce pain. Some approaches teach people how to focus on their breathing. Research shows that relaxation therapy can help with fibromyalgia, headache, osteoarthritis, and other conditions. Hypnosis. Studies have found this approach helpful with different sorts of pain, like back pain, repetitive strain injuries, and cancer pain. Guided imagery. Research shows that guided imagery can help with conditions like headache pain, cancer pain, osteoarthritis, and fibromyalgia. How does it work? An expert would teach you ways to direct your thoughts by focusing on specific images. Music therapy. This approach gets people to either perform or listen to music. Studies have found that it can help with many different pain conditions, like osteoarthritis and cancer pain. Biofeedback. This approach teaches you how to control normally unconscious bodily functions, like blood pressure or your heart rate. Studies have found that it can help with headaches, fibromyalgia, and other conditions. Massage. It's undeniably relaxing. And there's some evidence that massage can help ease pain from rheumatoid arthritis, neck and back injuries, and fibromyalgia.  Risky Alternative Pain Treatments Experts say you should keep your expectations for alternative pain treatments modest -- especially when it comes to "miracle cures." Controlling chronic pain is not simple. A single supplement, device, or treatment is not going to make your chronic pain disappear. You a need to be suspicious of anyone pushing a treatment when the financial motive is blatant. That doesn't only apply to pain  treatments advertised on dubious web sites asking for your credit card number.  Experts say that you should try to keep up to date with research into alternative treatments for chronic pain. The options for people with chronic pain are always growing -- and some of the odder treatments of today might become the mainstream treatments of tomorrow.

## 2014-12-12 NOTE — Assessment & Plan Note (Signed)
On aggressive treatment with continued symptoms.  Will refer to pulmonary

## 2014-12-12 NOTE — Progress Notes (Signed)
BP 137/83 mmHg  Pulse 78  Temp(Src) 98.3 F (36.8 C)  Ht 5' 6.5" (1.689 m)  Wt 194 lb 3.2 oz (88.089 kg)  BMI 30.88 kg/m2  SpO2 91%   Subjective:    Patient ID: [name], female    DOB: 1955/01/21, 59 y.o.   MRN: [id number]  HPI: [name] is a 59 y.o. female  Chief Complaint  Patient presents with  . Establish Care    pt states she would like to have referral for colonoscopy   Several issues she wants addressed.    Never had a colonoscopy but has had anal cancer and has a colostomy bag.  She is concerned that she has anal cancer but never had one.    Blood clot: noted at port site.  Surgically removed, not all of it..  Dr. Lucky Cowboy wants to have another [name] to check the status.    COPD Uses rexcue inhaler once or twice a day Feels symptoms are not well controlled: Using medications without problems Night time symptoms: yes ER visits since last visit: 1 for an unrelated issue [place] Increased SOB Not using O2  Fibromyalgia Would Lyrica back due to Fibromyalgia  Hypertension Using medications without difficulty Average home BPs   No problems or lightheadedness No chest pain with exertion or shortness of breath No Edema  Hyperlipidemia Using medications without problems: No Muscle aches  Diet compliance: good Exercise: none  Psychiatrist wants to make sure she gets a lipid panel.  On Simvistatin.  Reviewed labs.  Cholesterol does need to be checked  Depression screen [facility] 2/9 12/12/2014 09/06/2014  Decreased Interest 0 0  Down, Depressed, Hopeless 1 0  PHQ - 2 Score 1 0     Relevant past medical, surgical, family and social history reviewed and updated as indicated. Interim medical history since our last visit reviewed. Allergies and medications reviewed and updated.  Review of Systems  Per HPI unless specifically indicated above     Objective:    BP 137/83 mmHg  Pulse 78  Temp(Src) 98.3 F (36.8 C)  Ht 5' 6.5" (1.689 m)  Wt 194 lb  3.2 oz (88.089 kg)  BMI 30.88 kg/m2  SpO2 91%  Wt Readings from Last 3 Encounters:  12/12/14 194 lb 3.2 oz (88.089 kg)  11/22/14 196 lb (88.905 kg)  10/30/14 190 lb 4.1 oz (86.3 kg)    Physical Exam  Constitutional: She is oriented to person, place, and time. She appears well-developed and well-nourished. No distress.  HENT:  Head: Normocephalic and atraumatic.  Eyes: Conjunctivae and lids are normal. Right eye exhibits no discharge. Left eye exhibits no discharge. No scleral icterus.  Neck: Normal range of motion. Neck supple. No JVD present. Carotid bruit is not present.  Cardiovascular: Normal rate, regular rhythm and normal heart sounds.   Pulmonary/Chest: Effort normal and breath sounds normal.  Abdominal: Normal appearance. There is no splenomegaly or hepatomegaly.  Musculoskeletal: Normal range of motion.  Neurological: She is alert and oriented to person, place, and time.  Skin: Skin is warm, dry and intact. No rash noted. No pallor.  Psychiatric: She has a normal mood and affect. Her behavior is normal. Judgment and thought content normal.   Assessment & Plan:   Problem List Items Addressed This Visit      Unprioritized   BP (high blood pressure)    Stable, continue present medications.   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  Will refer to pulmonary      Relevant Orders   Ambulatory referral to Pulmonology   Fibromyalgia    Restart Lyrica          Follow up plan: Return in about 3 months (around 03/12/2015) for for physical.

## 2014-12-12 NOTE — Assessment & Plan Note (Signed)
Stable, continue present medications.   CMP done through oncology

## 2014-12-12 NOTE — Assessment & Plan Note (Signed)
Check Lipid panel next visit

## 2014-12-12 NOTE — Assessment & Plan Note (Signed)
Restart Lyrica

## 2014-12-13 ENCOUNTER — Inpatient Hospital Stay: Payer: Commercial Managed Care - HMO

## 2014-12-13 ENCOUNTER — Telehealth: Payer: Self-pay

## 2014-12-13 NOTE — Telephone Encounter (Signed)
Lyrica prescription was printed so I called it into the pharmacy for the patient.

## 2014-12-14 ENCOUNTER — Inpatient Hospital Stay: Payer: Commercial Managed Care - HMO

## 2014-12-14 ENCOUNTER — Ambulatory Visit: Payer: Commercial Managed Care - HMO

## 2014-12-15 ENCOUNTER — Inpatient Hospital Stay: Payer: Commercial Managed Care - HMO

## 2014-12-15 ENCOUNTER — Other Ambulatory Visit: Payer: Self-pay | Admitting: Unknown Physician Specialty

## 2014-12-15 MED ORDER — BACLOFEN 10 MG PO TABS: 10.0000 mg | ORAL_TABLET | Freq: Three times a day (TID) | ORAL | Status: DC

## 2014-12-15 NOTE — Telephone Encounter (Signed)
Called patient to ask about this medication because I did not have it in her medication list. Patient stated that her previous doctor at [facility] used to write it for her. Patient states she is having a lot of muscle spasms. Pharmacy is [facility].

## 2014-12-15 NOTE — Telephone Encounter (Signed)
Pt needs baclofen sent to [facility]

## 2014-12-18 ENCOUNTER — Ambulatory Visit: Payer: Commercial Managed Care - HMO

## 2014-12-18 ENCOUNTER — Inpatient Hospital Stay: Payer: Commercial Managed Care - HMO

## 2014-12-18 ENCOUNTER — Other Ambulatory Visit: Payer: Commercial Managed Care - HMO

## 2014-12-18 ENCOUNTER — Telehealth: Payer: Self-pay

## 2014-12-18 NOTE — Telephone Encounter (Signed)
Called and left patient a voicemail letting her know that Malachy Mood wrote order on a prescription pad because she could not find it in the computer. I told her she could pick up precription at the front desk when she got a chance.

## 2014-12-18 NOTE — Telephone Encounter (Signed)
Patient called and wanted to know if Malachy Mood could order her colostomy supplies. She stated she usually gets them through [facility] and that her bag and connector are 2 and 1/4 inch.

## 2014-12-19 ENCOUNTER — Inpatient Hospital Stay: Payer: Commercial Managed Care - HMO

## 2014-12-20 ENCOUNTER — Inpatient Hospital Stay: Payer: Commercial Managed Care - HMO

## 2014-12-20 ENCOUNTER — Encounter: Payer: Self-pay | Admitting: Emergency Medicine

## 2014-12-20 ENCOUNTER — Emergency Department
Admission: EM | Admit: 2014-12-20 | Discharge: 2014-12-20 | Disposition: A | Discharge status: Home / Self Care | Payer: Commercial Managed Care - HMO | Attending: Emergency Medicine | Admitting: Emergency Medicine

## 2014-12-20 DIAGNOSIS — Z433 Encounter for attention to colostomy: Secondary | ICD-10-CM | POA: Insufficient documentation

## 2014-12-20 DIAGNOSIS — Z7951 Long term (current) use of inhaled steroids: Secondary | ICD-10-CM | POA: Diagnosis not present

## 2014-12-20 DIAGNOSIS — Z7902 Long term (current) use of antithrombotics/antiplatelets: Secondary | ICD-10-CM | POA: Diagnosis not present

## 2014-12-20 DIAGNOSIS — B029 Zoster without complications: Secondary | ICD-10-CM | POA: Insufficient documentation

## 2014-12-20 DIAGNOSIS — F17210 Nicotine dependence, cigarettes, uncomplicated: Secondary | ICD-10-CM | POA: Insufficient documentation

## 2014-12-20 DIAGNOSIS — Z79899 Other long term (current) drug therapy: Secondary | ICD-10-CM | POA: Insufficient documentation

## 2014-12-20 DIAGNOSIS — R21 Rash and other nonspecific skin eruption: Secondary | ICD-10-CM | POA: Diagnosis not present

## 2014-12-20 DIAGNOSIS — I10 Essential (primary) hypertension: Secondary | ICD-10-CM | POA: Insufficient documentation

## 2014-12-20 MED ORDER — CLINDAMYCIN HCL 150 MG PO CAPS: ORAL_CAPSULE | ORAL | Status: DC

## 2014-12-20 NOTE — Discharge Instructions (Signed)
Follow-up with your doctor if any continued problems. You may use warm moist compresses to the area near your vagina. Begin taking clindamycin as directed. Return to the emergency room if any severe worsening of her symptoms.

## 2014-12-20 NOTE — ED Provider Notes (Signed)
[facility] Emergency Department Provider Note ____________________________________________  Time seen: Approximately 11:31 AM  I have reviewed the triage vital signs and the nursing notes.   HISTORY  Chief Complaint colostomy bag ruptured. no bags at home    HPI [name] is a 59 y.o. female is here for replacement of an ostomy bag that busted.  Patient states that she has noticed supplies to replace this until tomorrow. She also is here for a "bump" that she noticed a few days ago. She states that it is only tender when she uses poor tissue to light. She has not seen any blood or vaginal discharge. She has not had any fever or chills. She states that she has never been diagnosed with MRSA in the past. She denies any prior abscesses.She rates her discomfort as a 4 out of 10.   Past Medical History  Diagnosis Date  . Schizophrenia ([place])   . Asthma   . GERD (gastroesophageal reflux disease)   . Anxiety   . Depression   . Bipolar disorder ([place])   . COPD (chronic obstructive pulmonary disease) ([place])   . Occasional tremors     right hand  . PTSD (post-traumatic stress disorder)   . Shortness of breath dyspnea   . Fibromyalgia   . DVT (deep venous thrombosis) ([place]) 2011    RUE  . Thyroid nodule   . DDD (degenerative disc disease), lumbar   . Spinal stenosis   . Peripheral neuropathy ([place])   . Rotator cuff tear     right  . Pneumonia 2011  . Hypothyroidism     no meds currently  . Anemia     during pregnancy only  . Hypertension     Off meds x 15 years-well controlled now per pt  . Squamous cell cancer, anus (HCC)   . Severe obstructive sleep apnea 06/27/2014  . DVT of upper extremity (deep vein thrombosis) ([place]) 10/14/2014    Patient Active Problem List   Diagnosis Date Noted  . COPD, very severe ([place]) 12/12/2014  . Fibromyalgia 12/12/2014  . DVT of upper extremity (deep vein thrombosis) ([place]) 10/14/2014  . B12 deficiency 09/28/2014   . Folate deficiency 09/28/2014  . Macrocytosis 09/21/2014  . Depression   . Hypokalemia 07/20/2014  . Anal cancer ([place]) 06/27/2014  . Anal fissure 06/27/2014  . Anxiety 06/27/2014  . Airway hyperreactivity 06/27/2014  . H/O manic depressive disorder 06/27/2014  . CAFL (chronic airflow limitation) ([place]) 06/27/2014  . Clinical depression 06/27/2014  . Deep vein thrombosis ([place]) 06/27/2014  . External hemorrhoid 06/27/2014  . Myalgia and myositis 06/27/2014  . Acid reflux 06/27/2014  . Hemorrhoid 06/27/2014  . BP (high blood pressure) 06/27/2014  . HLD (hyperlipidemia) 06/27/2014  . Adult hypothyroidism 06/27/2014  . LBP (low back pain) 06/27/2014  . Mass of perianal area 06/27/2014  . External hemorrhoid, thrombosed 06/27/2014  . 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 Ulcerated hemorrhoid 06/27/2014  . Severe obstructive sleep apnea 06/27/2014  . Squamous cell cancer, anus (HCC)   . Rectal ulcer 05/17/2014    Past Surgical History  Procedure Laterality Date  . Foot surgery Right   . Tubal ligation    . Eye surgery Bilateral   . Mouth surgery  2002  . Rectal biopsy N/A 05/08/2014    Procedure: BIOPSY RECTAL;  Surgeon: Marlyce Huge, MD;  Location: [facility] ORS;  Service: General;  Laterality: N/A;  . Evaluation under anesthesia with hemorrhoidectomy N/A 05/08/2014    Procedure:  EXAM UNDER ANESTHESIA WITH HEMORRHOIDECTOMY;  Surgeon: Marlyce Huge, MD;  Location: [facility] ORS;  Service: General;  Laterality: N/A;  . Portacath placement N/A 05/20/2014    Procedure: INSERTION PORT-A-CATH;  Surgeon: Florene Glen, MD;  Location: [facility] ORS;  Service: General;  Laterality: N/A;  . Laparoscopic diverted colostomy N/A 05/26/2014    Procedure: LAPAROSCOPIC DIVERTED COLOSTOMY;  Surgeon: Marlyce Huge, MD;  Location: [facility] ORS;  Service: General;  Laterality: N/A;  . Peripheral vascular catheterization Left 10/16/2014    Procedure: Upper Extremity Venography with thrombectomy,  port removal;  Surgeon: Algernon Huxley, MD;  Location: [place] [facility];  Service: Cardiovascular;  Laterality: Left;  . Peripheral vascular catheterization  10/16/2014    Procedure: Upper Extremity Intervention;  Surgeon: Algernon Huxley, MD;  Location: [place] [facility];  Service: Cardiovascular;;    Current Outpatient Rx  Name  Route  Sig  Dispense  Refill  . albuterol (PROVENTIL HFA;VENTOLIN HFA) 108 (90 BASE) MCG/ACT inhaler   Inhalation   Inhale 1 puff into the lungs every 6 (six) hours as needed for wheezing or shortness of breath.         . ARIPiprazole (ABILIFY) 2 MG tablet   Oral   Take 1 tablet (2 mg total) by mouth daily.   30 tablet   2   . baclofen (LIORESAL) 10 MG tablet   Oral   Take 1 tablet (10 mg total) by mouth 3 (three) times daily.   90 each   1   . budesonide-formoterol (SYMBICORT) 160-4.5 MCG/ACT inhaler   Inhalation   Inhale 2 puffs into the lungs 2 (two) times daily.   1 Inhaler   12   . Calcium Carb-Cholecalciferol (CALCIUM 600+D) 600-800 MG-UNIT TABS   Oral   Take 1 tablet by mouth 2 (two) times daily.          . clindamycin (CLEOCIN) 150 MG capsule      Take 2 qid until gone   56 capsule   0   . clonazePAM (KLONOPIN) 1 MG tablet   Oral   Take 1 tablet (1 mg total) by mouth 3 (three) times daily as needed for anxiety.   90 tablet   2   . DULoxetine (CYMBALTA) 60 MG capsule   Oral   Take 1 capsule (60 mg total) by mouth daily.   30 capsule   2   . escitalopram (LEXAPRO) 20 MG tablet   Oral   Take 1 tablet (20 mg total) by mouth daily.   30 tablet   2   . oxybutynin (DITROPAN) 5 MG tablet   Oral   Take 5 mg by mouth 2 (two) times daily.         Marland Kitchen oxyCODONE-acetaminophen (PERCOCET) 10-325 MG tablet   Oral   Take 1 tablet by mouth every 6 (six) hours as needed for pain.   60 tablet   0   . pregabalin (LYRICA) 150 MG capsule   Oral   Take 1 capsule (150 mg total) by mouth 2 (two) times daily.   180 capsule   1   .  ranitidine (ZANTAC) 150 MG tablet   Oral   Take 150 mg by mouth 2 (two) times daily.         Marland Kitchen senna (SENOKOT) 8.6 MG tablet   Oral   Take 2 tablets by mouth 2 (two) times daily as needed for constipation.         . simvastatin (ZOCOR) 40  MG tablet   Oral   Take 40 mg by mouth at bedtime.          . sucralfate (CARAFATE) 1 G tablet   Oral   Take 1 tablet (1 g total) by mouth 3 (three) times daily. Dissolve tablet in 2-3 tbsp of warm water; swish and swallow.   90 tablet   3   . tiotropium (SPIRIVA) 18 MCG inhalation capsule   Inhalation   Place 18 mcg into inhaler and inhale daily.         . traMADol (ULTRAM) 50 MG tablet   Oral   Take 50 mg by mouth daily as needed.          . warfarin (COUMADIN) 5 MG tablet   Oral   Take 1 tablet (5 mg total) by mouth daily.   30 tablet   0   . zolpidem (AMBIEN) 10 MG tablet   Oral   Take 1 tablet (10 mg total) by mouth at bedtime as needed for sleep.   30 tablet   2     Allergies Sulfa antibiotics  Family History  Problem Relation Age of Onset  . Cancer Maternal Aunt   . Cancer Paternal Uncle   . Diabetes Mother   . Thyroid disease Mother   . Kidney failure Mother   . Hypertension Mother   . Depression Mother   . Mental illness Son   . Aneurysm Maternal Grandmother   . Thyroid disease Maternal Grandmother   . Stroke Maternal Grandfather   . Hypertension Maternal Grandfather   . Diabetes Maternal Grandfather   . Heart disease Maternal Grandfather     MI  . Nephrolithiasis Daughter     Social History Social History  Substance Use Topics  . Smoking status: Current Every Day Smoker -- 0.25 packs/day for 44 years    Types: Cigarettes    Start date: 10/04/1970  . Smokeless tobacco: Never Used  . Alcohol Use: No     Comment: 1 drink every 2-3 times/year    Review of Systems Constitutional: No fever/chills Cardiovascular: Denies chest pain. Respiratory: Denies shortness of breath. Gastrointestinal: No  abdominal pain.  No nausea, no vomiting.   Genitourinary: Negative for dysuria. Skin: Positive "bump" genital area Neurological: Negative for headaches, focal weakness or numbness.  10-point ROS otherwise negative.  ____________________________________________   PHYSICAL EXAM:  VITAL SIGNS: ED Triage Vitals  Enc Vitals Group     BP 12/20/14 1000 131/74 mmHg     Pulse Rate 12/20/14 0956 71     Resp 12/20/14 0956 18     Temp 12/20/14 0956 98.4 F (36.9 C)     Temp Source 12/20/14 0956 Oral     SpO2 12/20/14 0956 97 %     Weight 12/20/14 0956 197 lb (89.359 kg)     Height 12/20/14 0956 5' 6.5" (1.689 m)     Head Cir --      Peak Flow --      Pain Score 12/20/14 0957 4     Pain Loc --      Pain Edu? --      Excl. in [place]? --     Constitutional: Alert and oriented. Well appearing and in no acute distress. Eyes: Conjunctivae are normal. PERRL. EOMI. Head: Atraumatic. Nose: No congestion/rhinnorhea. Neck: No stridor.   Cardiovascular: Normal rate, regular rhythm. Grossly normal heart sounds.  Good peripheral circulation. Respiratory: Normal respiratory effort.  No retractions. Lungs CTAB. Musculoskeletal: Moves upper and lower  extremities without any difficulty. Neurologic:  Normal speech and language. No gross focal neurologic deficits are appreciated. No gait instability. Skin:  Skin is warm, dry and intact. There is a small erythematous single papule to the right labia without abscess or cellulitis. Psychiatric: Mood and affect are normal. Speech and behavior are normal.  ____________________________________________   LABS (all labs ordered are listed, but only abnormal results are displayed)  Labs Reviewed - No data to display  PROCEDURES  Procedure(s) performed: None  Critical Care performed: No  ____________________________________________   INITIAL IMPRESSION / ASSESSMENT AND PLAN / ED COURSE  Pertinent labs & imaging results that were available during my  care of the patient were reviewed by me and considered in my medical decision making (see chart for details).  Ostomy bag was replaced by the RN. Patient hygiene is questionable since currently she has a nonfunctioning ostomy bag. To prevent papule in the vaginal area to becoming an abscess formation patient was placed on clindamycin and encouraged to use warm compresses to the area. She will return to the emergency room if any severe worsening of this. ____________________________________________   FINAL CLINICAL IMPRESSION(S) / ED DIAGNOSES  Final diagnoses:  Colostomy care ([place])  Rash and nonspecific skin eruption      Johnn Hai, PA-C 12/20/14 1509  Eula Listen, MD 12/20/14 [phone]

## 2014-12-20 NOTE — ED Notes (Signed)
Waiting on ostomy supplied

## 2014-12-20 NOTE — ED Notes (Signed)
Pt woke up and ostomy bag busted. No suppplies until tomorrow at home

## 2014-12-21 ENCOUNTER — Other Ambulatory Visit: Payer: Self-pay | Admitting: Hematology and Oncology

## 2014-12-21 ENCOUNTER — Inpatient Hospital Stay: Payer: Commercial Managed Care - HMO

## 2014-12-21 ENCOUNTER — Inpatient Hospital Stay (HOSPITAL_BASED_OUTPATIENT_CLINIC_OR_DEPARTMENT_OTHER): Payer: Commercial Managed Care - HMO | Admitting: Hematology and Oncology

## 2014-12-21 ENCOUNTER — Other Ambulatory Visit: Payer: Self-pay

## 2014-12-21 VITALS — BP 149/82 | HR 76 | Temp 97.0°F | Resp 18 | Ht 66.5 in | Wt 189.6 lb

## 2014-12-21 DIAGNOSIS — F17210 Nicotine dependence, cigarettes, uncomplicated: Secondary | ICD-10-CM | POA: Diagnosis not present

## 2014-12-21 DIAGNOSIS — M4800 Spinal stenosis, site unspecified: Secondary | ICD-10-CM | POA: Diagnosis not present

## 2014-12-21 DIAGNOSIS — C210 Malignant neoplasm of anus, unspecified: Secondary | ICD-10-CM | POA: Diagnosis present

## 2014-12-21 DIAGNOSIS — K219 Gastro-esophageal reflux disease without esophagitis: Secondary | ICD-10-CM | POA: Diagnosis not present

## 2014-12-21 DIAGNOSIS — M5136 Other intervertebral disc degeneration, lumbar region: Secondary | ICD-10-CM | POA: Diagnosis not present

## 2014-12-21 DIAGNOSIS — Z8701 Personal history of pneumonia (recurrent): Secondary | ICD-10-CM | POA: Diagnosis not present

## 2014-12-21 DIAGNOSIS — I10 Essential (primary) hypertension: Secondary | ICD-10-CM

## 2014-12-21 DIAGNOSIS — E538 Deficiency of other specified B group vitamins: Secondary | ICD-10-CM

## 2014-12-21 DIAGNOSIS — F319 Bipolar disorder, unspecified: Secondary | ICD-10-CM

## 2014-12-21 DIAGNOSIS — C20 Malignant neoplasm of rectum: Secondary | ICD-10-CM | POA: Diagnosis not present

## 2014-12-21 DIAGNOSIS — Z79899 Other long term (current) drug therapy: Secondary | ICD-10-CM | POA: Diagnosis not present

## 2014-12-21 DIAGNOSIS — F329 Major depressive disorder, single episode, unspecified: Secondary | ICD-10-CM

## 2014-12-21 DIAGNOSIS — J45909 Unspecified asthma, uncomplicated: Secondary | ICD-10-CM

## 2014-12-21 DIAGNOSIS — M797 Fibromyalgia: Secondary | ICD-10-CM

## 2014-12-21 DIAGNOSIS — Z808 Family history of malignant neoplasm of other organs or systems: Secondary | ICD-10-CM | POA: Diagnosis not present

## 2014-12-21 DIAGNOSIS — R0602 Shortness of breath: Secondary | ICD-10-CM | POA: Diagnosis not present

## 2014-12-21 DIAGNOSIS — G4733 Obstructive sleep apnea (adult) (pediatric): Secondary | ICD-10-CM | POA: Diagnosis not present

## 2014-12-21 DIAGNOSIS — I82629 Acute embolism and thrombosis of deep veins of unspecified upper extremity: Secondary | ICD-10-CM

## 2014-12-21 DIAGNOSIS — N941 Unspecified dyspareunia: Secondary | ICD-10-CM | POA: Diagnosis not present

## 2014-12-21 DIAGNOSIS — F4310 Post-traumatic stress disorder, unspecified: Secondary | ICD-10-CM | POA: Diagnosis not present

## 2014-12-21 DIAGNOSIS — D649 Anemia, unspecified: Secondary | ICD-10-CM | POA: Diagnosis not present

## 2014-12-21 DIAGNOSIS — R251 Tremor, unspecified: Secondary | ICD-10-CM | POA: Diagnosis not present

## 2014-12-21 DIAGNOSIS — R300 Dysuria: Secondary | ICD-10-CM | POA: Diagnosis not present

## 2014-12-21 DIAGNOSIS — J449 Chronic obstructive pulmonary disease, unspecified: Secondary | ICD-10-CM | POA: Diagnosis not present

## 2014-12-21 DIAGNOSIS — F419 Anxiety disorder, unspecified: Secondary | ICD-10-CM | POA: Diagnosis not present

## 2014-12-21 DIAGNOSIS — E039 Hypothyroidism, unspecified: Secondary | ICD-10-CM

## 2014-12-21 DIAGNOSIS — G629 Polyneuropathy, unspecified: Secondary | ICD-10-CM | POA: Diagnosis not present

## 2014-12-21 DIAGNOSIS — F209 Schizophrenia, unspecified: Secondary | ICD-10-CM | POA: Diagnosis not present

## 2014-12-21 DIAGNOSIS — I82622 Acute embolism and thrombosis of deep veins of left upper extremity: Secondary | ICD-10-CM

## 2014-12-21 DIAGNOSIS — Z7901 Long term (current) use of anticoagulants: Secondary | ICD-10-CM

## 2014-12-21 DIAGNOSIS — K6289 Other specified diseases of anus and rectum: Secondary | ICD-10-CM | POA: Diagnosis not present

## 2014-12-21 LAB — CBC WITH DIFFERENTIAL/PLATELET
Basophils Absolute: 0.1 10*3/uL (ref 0–0.1)
Basophils Relative: 1 %
Eosinophils Absolute: 0.4 10*3/uL (ref 0–0.7)
Eosinophils Relative: 9 %
HCT: 38.2 % (ref 35.0–47.0)
Hemoglobin: 12.8 g/dL (ref 12.0–16.0)
Lymphocytes Relative: 14 %
Lymphs Abs: 0.7 10*3/uL — ABNORMAL LOW (ref 1.0–3.6)
MCH: 31.6 pg (ref 26.0–34.0)
MCHC: 33.5 g/dL (ref 32.0–36.0)
MCV: 94.3 fL (ref 80.0–100.0)
Monocytes Absolute: 0.4 10*3/uL (ref 0.2–0.9)
Monocytes Relative: 8 %
Neutro Abs: 3.6 10*3/uL (ref 1.4–6.5)
Neutrophils Relative %: 68 %
Platelets: 136 10*3/uL — ABNORMAL LOW (ref 150–440)
RBC: 4.05 MIL/uL (ref 3.80–5.20)
RDW: 15.3 % — ABNORMAL HIGH (ref 11.5–14.5)
WBC: 5.2 10*3/uL (ref 3.6–11.0)

## 2014-12-21 LAB — COMPREHENSIVE METABOLIC PANEL
ALT: 14 U/L (ref 14–54)
AST: 28 U/L (ref 15–41)
Albumin: 3.3 g/dL — ABNORMAL LOW (ref 3.5–5.0)
Alkaline Phosphatase: 57 U/L (ref 38–126)
Anion gap: 8 (ref 5–15)
BUN: 10 mg/dL (ref 6–20)
CO2: 26 mmol/L (ref 22–32)
Calcium: 8.8 mg/dL — ABNORMAL LOW (ref 8.9–10.3)
Chloride: 102 mmol/L (ref 101–111)
Creatinine, Ser: 0.72 mg/dL (ref 0.44–1.00)
GFR calc Af Amer: >60 mL/min (ref >60)
GFR calc non Af Amer: >60 mL/min (ref >60)
Glucose, Bld: 75 mg/dL (ref 65–99)
Potassium: 3.3 mmol/L — ABNORMAL LOW (ref 3.5–5.1)
Sodium: 136 mmol/L (ref 135–145)
Total Bilirubin: 0.8 mg/dL (ref 0.3–1.2)
Total Protein: 6.8 g/dL (ref 6.5–8.1)

## 2014-12-21 LAB — PROTIME-INR
INR: 1.16
Prothrombin Time: 15 seconds (ref 11.4–15.0)

## 2014-12-21 MED ORDER — WARFARIN SODIUM 1 MG PO TABS: 1.0000 mg | ORAL_TABLET | ORAL | Status: DC

## 2014-12-21 MED ORDER — CYANOCOBALAMIN 1000 MCG/ML IJ SOLN
1000.0000 ug | Freq: Once | INTRAMUSCULAR | Status: AC
  Administered 2014-12-21: 1000 ug via INTRAMUSCULAR
  Filled 2014-12-21: qty 1

## 2014-12-21 NOTE — Progress Notes (Signed)
Patient is here for follow-up of anal cancer. Patient states that she does have pain in her anus and sometimes the pain is an 8/10. She went to the ER yesterday because her ostomy bag lost the seal and it leaked all over. While at the ER, patient mentioned that she told the doctor that she is having burning when she wipes. No burning with urination, but when she wipes. She was started on Clindamycin x 7 days.

## 2014-12-21 NOTE — Progress Notes (Signed)
[facility] day:  12/21/2014   Chief Complaint: PRECILLA PURNELL is a 59 y.o. 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  HPI:  The patient was last seen in the medical oncology clinic on 10/30/2014.  At that time, she was approximately 4 months s/p initiation of 5FU and mitomycin C with radiation.  Symptomatically, she was doing well. Exam revealed a dramatic improvement in the perirectal area since hyperbaric oxygen.  She had been diagnosed with B12 deficiency as well as folic acid deficiency. She was encouraged to pick her folic acid at the pharmacy. She was to begin weekly B12 6 then monthly.  She was to follow-up with Dr. Lucky Cowboy of vascular surgery regarding her left upper extremity DVT. She was continuing on Coumadin and Lovenox her left upper extremity DVT.  Her oxycodone was refilled. She received the influenza vaccine. She was follow-up with the social worker to help with her medication costs.  The patient states that she was in the emergency room yesterday. Her ostomy bag became loose. She states supplies are coming. She is eating better.  She is sleeping well.  Her only concern is leakage in her rectal area. She notes almost regular bowel movements coming through.  He notes a discomfort with intercourse. She states that she feels like a virgin.  She has had no follow-up with hyperbaric therapy (postponed until cleared by Dr. Lucky Cowboy).  She states that she she is seeing Granville Lewis, FNP at [facility].  Plan is for follow-up of her blood clot. An ultrasound of her arm is planned. She last saw Dr. Lucky Cowboy when she was in the hospital with removal of her port. She is on Coumadin 5 mg a day. She denies any bruising or bleeding.  Past Medical History  Diagnosis Date  . Schizophrenia ([place])   . Asthma   . GERD (gastroesophageal reflux disease)   . Anxiety   . Depression   . Bipolar  disorder ([place])   . COPD (chronic obstructive pulmonary disease) ([place])   . Occasional tremors     right hand  . PTSD (post-traumatic stress disorder)   . Shortness of breath dyspnea   . Fibromyalgia   . DVT (deep venous thrombosis) ([place]) 2011    RUE  . Thyroid nodule   . DDD (degenerative disc disease), lumbar   . Spinal stenosis   . Peripheral neuropathy ([place])   . Rotator cuff tear     right  . Pneumonia 2011  . Hypothyroidism     no meds currently  . Anemia     during pregnancy only  . Hypertension     Off meds x 15 years-well controlled now per pt  . Squamous cell cancer, anus ([facility])   . Severe obstructive sleep apnea 06/27/2014  . DVT of upper extremity (deep vein thrombosis) ([place]) 10/14/2014    Past Surgical History  Procedure Laterality Date  . Foot surgery Right   . Tubal ligation    . Eye surgery Bilateral   . Mouth surgery  2002  . Rectal biopsy N/A 05/08/2014    Procedure: BIOPSY RECTAL;  Surgeon: Marlyce Huge, MD;  Location: [facility] ORS;  Service: General;  Laterality: N/A;  . Evaluation under anesthesia with hemorrhoidectomy N/A 05/08/2014    Procedure: EXAM UNDER ANESTHESIA WITH HEMORRHOIDECTOMY;  Surgeon: Marlyce Huge, MD;  Location: [facility] OR[facility];  Service: General;  Laterality: N/A;  .  Portacath placement N/A 05/20/2014    Procedure: INSERTION PORT-A-CATH;  Surgeon: Florene Glen, MD;  Location: [facility] OR[facility];  Service: General;  Laterality: N/A;  . Laparoscopic diverted colostomy N/A 05/26/2014    Procedure: LAPAROSCOPIC DIVERTED COLOSTOMY;  Surgeon: Marlyce Huge, MD;  Location: [facility] OR[facility];  Service: General;  Laterality: N/A;  . Peripheral vascular catheterization Left 10/16/2014    Procedure: Upper Extremity Venography with thrombectomy, port removal;  Surgeon: Algernon Huxley, MD;  Location: [facility];  Service: Cardiovascular;  Laterality: Left;  . Peripheral vascular catheterization  10/16/2014    Procedure: Upper Extremity Intervention;   Surgeon: Algernon Huxley, MD;  Location: [place] [facility];  Service: Cardiovascular;;    Family History  Problem Relation Age of Onset  . Cancer Maternal Aunt   . Cancer Paternal Uncle   . Diabetes Mother   . Thyroid disease Mother   . Kidney failure Mother   . Hypertension Mother   . Depression Mother   . Mental illness Son   . Aneurysm Maternal Grandmother   . Thyroid disease Maternal Grandmother   . Stroke Maternal Grandfather   . Hypertension Maternal Grandfather   . Diabetes Maternal Grandfather   . Heart disease Maternal Grandfather     MI  . Nephrolithiasis Daughter     Social History:  reports that she has been smoking Cigarettes.  She started smoking about 44 years ago. She has a 11 pack-year smoking history. She has never used smokeless tobacco. She reports that she uses illicit drugs (Marijuana). She reports that she does not drink alcohol.  The patient is alone today.  Allergies:  Allergies  Allergen Reactions  . Sulfa Antibiotics Hives    Current Medications: Current Outpatient Prescriptions  Medication Sig Dispense Refill  . albuterol (PROVENTIL HFA;VENTOLIN HFA) 108 (90 BASE) MCG/ACT inhaler Inhale 1 puff into the lungs every 6 (six) hours as needed for wheezing or shortness of breath.    . ARIPiprazole (ABILIFY) 2 MG tablet Take 1 tablet (2 mg total) by mouth daily. 30 tablet 2  . baclofen (LIORESAL) 10 MG tablet Take 1 tablet (10 mg total) by mouth 3 (three) times daily. 90 each 1  . budesonide-formoterol (SYMBICORT) 160-4.5 MCG/ACT inhaler Inhale 2 puffs into the lungs 2 (two) times daily. 1 Inhaler 12  . Calcium Carb-Cholecalciferol (CALCIUM 600+D) 600-800 MG-UNIT TABS Take 1 tablet by mouth 2 (two) times daily.     . clindamycin (CLEOCIN) 150 MG capsule Take 2 qid until gone 56 capsule 0  . clonazePAM (KLONOPIN) 1 MG tablet Take 1 tablet (1 mg total) by mouth 3 (three) times daily as needed for anxiety. 90 tablet 2  . DULoxetine (CYMBALTA) 60 MG capsule  Take 1 capsule (60 mg total) by mouth daily. 30 capsule 2  . escitalopram (LEXAPRO) 20 MG tablet Take 1 tablet (20 mg total) by mouth daily. 30 tablet 2  . oxybutynin (DITROPAN) 5 MG tablet Take 5 mg by mouth 2 (two) times daily.    Marland Kitchen oxyCODONE-acetaminophen (PERCOCET) 10-325 MG tablet Take 1 tablet by mouth every 6 (six) hours as needed for pain. 60 tablet 0  . pregabalin (LYRICA) 150 MG capsule Take 1 capsule (150 mg total) by mouth 2 (two) times daily. 180 capsule 1  . ranitidine (ZANTAC) 150 MG tablet Take 150 mg by mouth 2 (two) times daily.    Marland Kitchen senna (SENOKOT) 8.6 MG tablet Take 2 tablets by mouth 2 (two) times daily as needed for constipation.    Marland Kitchen  simvastatin (ZOCOR) 40 MG tablet Take 40 mg by mouth at bedtime.     . sucralfate (CARAFATE) 1 G tablet Take 1 tablet (1 g total) by mouth 3 (three) times daily. Dissolve tablet in 2-3 tbsp of warm water; swish and swallow. 90 tablet 3  . tiotropium (SPIRIVA) 18 MCG inhalation capsule Place 18 mcg into inhaler and inhale daily.    . traMADol (ULTRAM) 50 MG tablet Take 50 mg by mouth daily as needed.     . warfarin (COUMADIN) 5 MG tablet Take 1 tablet (5 mg total) by mouth daily. 30 tablet 0  . zolpidem (AMBIEN) 10 MG tablet Take 1 tablet (10 mg total) by mouth at bedtime as needed for sleep. 30 tablet 2  . [DISCONTINUED] apixaban (ELIQUIS) 5 MG TABS tablet Take 1 tablet (5 mg total) by mouth 2 (two) times daily. 60 tablet 2   No current facility-administered medications for this visit.   Facility-Administered Medications Ordered in Other Visits  Medication Dose Route Frequency Provider Last Rate Last Dose  . heparin lock flush 100 unit/mL  500 Units Intravenous Once Lequita Asal, MD      . sodium chloride 0.9 % injection 10 mL  10 mL Intravenous PRN Lequita Asal, MD        Review of Systems:  GENERAL:  Feeling better.  No fevers or sweats.  Weight down 1 pound. PERFORMANCE STATUS (ECOG):  2 HEENT:  No visual changes, runny  nose, mouth sores or sore throat. Lungs: No shortness of breath or cough.  No hemoptysis. Cardiac:  No chest pain, palpitations, orthopnea, or PND. GI:  Colostomy.  Trouble with ostomy bags.  Stool from rectum.  No nausea, vomiting, constipation, melena or hematochezia. GU:  No urgency, frequency, dysuria, or hematuria.  Painful intercourse. Musculoskeletal:  No back pain.  No joint pain.  No muscle tenderness. Extremities:  No pain or swelling. Skin:  Improvement in per-rectal breakdown since hyperbaric oxygen. Neuro:  No headache, numbness or weakness, balance or coordination issues. Endocrine:  No diabetes, thyroid issues, hot flashes or night sweats. Psych:  No mood changes or depression.  Less anxiety. Pain:  Decreased pain associated with anal mass.  Using oxycodone prn. Review of systems:  All other systems reviewed and found to be negative.   Physical Exam: Blood pressure 149/82, pulse 76, temperature 97 F (36.1 C), temperature source Tympanic, resp. rate 18, height 5' 6.5" (1.689 m), weight 189 lb 9.5 oz (86 kg).  GENERAL:  Chronically ill appearing woman lying on her side in the exam room in no acute distress.  MENTAL STATUS:  Alert and oriented to person, place and time. HEAD:  Long blonde/graying hair in pony tail.  Normocephalic, atraumatic, face symmetric, no Cushingoid features. EYES:  Blue eyes.  Pupils equal round and reactive to light and accomodation.  No conjunctivitis or scleral icterus. ENT: No oral lesions.  Upper dentures.  Tongue normal. Mucous membranes moist.  RESPIRATORY:  Clear to auscultation without rales, wheezes or rhonchi. CARDIOVASCULAR:  Regular rate and rhythm without murmur, rub or gallop. CHEST WALL:  s/p port-a-cath removal. ABDOMEN:  Ostomy.  Soft, non-tender, with active bowel sounds, and no hepatosplenomegaly.  No masses. RECTUM:  Perirectal area with pink healthy skin.Marland Kitchen SKIN: No other rashes, ulcers or lesions. EXTREMITIES: Mild left upper  extremity edema.  No skin discoloration or tenderness.  No palpable cords. LYMPH NODES: No palpable cervical, supraclavicular, axillary or inguinal adenopathy  NEUROLOGICAL: Unremarkable. PSYCH:  Appropriate.  Appointment on 12/21/2014  Component Date Value Ref Range Status  . WBC 12/21/2014 5.2  3.6 - 11.0 K/uL Final  . RBC 12/21/2014 4.05  3.80 - 5.20 MIL/uL Final  . Hemoglobin 12/21/2014 12.8  12.0 - 16.0 g/dL Final  . HCT 12/21/2014 38.2  35.0 - 47.0 % Final  . MCV 12/21/2014 94.3  80.0 - 100.0 fL Final  . MCH 12/21/2014 31.6  26.0 - 34.0 pg Final  . MCHC 12/21/2014 33.5  32.0 - 36.0 g/dL Final  . RDW 12/21/2014 15.3* 11.5 - 14.5 % Final  . Platelets 12/21/2014 136* 150 - 440 K/uL Final  . Neutrophils Relative % 12/21/2014 68   Final  . Neutro Abs 12/21/2014 3.6  1.4 - 6.5 K/uL Final  . Lymphocytes Relative 12/21/2014 14   Final  . Lymphs Abs 12/21/2014 0.7* 1.0 - 3.6 K/uL Final  . Monocytes Relative 12/21/2014 8   Final  . Monocytes Absolute 12/21/2014 0.4  0.2 - 0.9 K/uL Final  . Eosinophils Relative 12/21/2014 9   Final  . Eosinophils Absolute 12/21/2014 0.4  0 - 0.7 K/uL Final  . Basophils Relative 12/21/2014 1   Final  . Basophils Absolute 12/21/2014 0.1  0 - 0.1 K/uL Final  . Sodium 12/21/2014 136  135 - 145 mmol/L Final  . Potassium 12/21/2014 3.3* 3.5 - 5.1 mmol/L Final  . Chloride 12/21/2014 102  101 - 111 mmol/L Final  . CO2 12/21/2014 26  22 - 32 mmol/L Final  . Glucose, Bld 12/21/2014 75  65 - 99 mg/dL Final  . BUN 12/21/2014 10  6 - 20 mg/dL Final  . Creatinine, Ser 12/21/2014 0.72  0.44 - 1.00 mg/dL Final  . Calcium 12/21/2014 8.8* 8.9 - 10.3 mg/dL Final  . Total Protein 12/21/2014 6.8  6.5 - 8.1 g/dL Final  . Albumin 12/21/2014 3.3* 3.5 - 5.0 g/dL Final  . AST 12/21/2014 28  15 - 41 U/L Final  . ALT 12/21/2014 14  14 - 54 U/L Final  . Alkaline Phosphatase 12/21/2014 57  38 - 126 U/L Final  . Total Bilirubin 12/21/2014 0.8  0.3 - 1.2 mg/dL Final  . GFR calc  non Af Amer 12/21/2014 >60  >60 mL/min Final  . GFR calc Af Amer 12/21/2014 >60  >60 mL/min Final   Comment: (NOTE) The eGFR has been calculated using the CKD EPI equation. This calculation has not been validated in all clinical situations. eGFR's persistently <60 mL/min signify possible Chronic Kidney Disease.   . Anion gap 12/21/2014 8  5 - 15 Final    Assessment:  CAPUCINE TRYON is a 59 y.o. 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  Chest, abdomen, and pelvic CT scan on 05/19/2014 revealed no evidence of metastatic disease.  There was a 4 mm RUL pulmonary nodule.  There were borderline (13 mm) enlarged iliac nodes.  There was a 4.7 x 4.1 x 6.0 cm lower rectal and anal mass consistent with anal cancer.  She underwent diverting colostomy on 05/26/2014.    She received concurrent chemotherapy (5FU and mitomycin-C) and radiation (06/29/2014 - 07/31/2014).  She missed 3 days of radiation (07/04-07/06/2014).  She has been off radiation since 08/23/2014 secondary to perirectal breakdown.  She received hyperbaric oxygen beginning 09/13/2014 (on hold since thrombosis).  She has macrocytic RBC indices.  Work-up on 09/28/2014 revealed a  B12 deficiency (169) and folate deficiency (5.2).  TSH was normal.  She receives B12 (last 11/23/2014).  She is supposed to take folic acid (initially not taking secondary to costs).  She developed a left upper extremity DVT on 10/14/2014.  She underwent thrombolysis, thrombectomy, and port removal on 10/16/2014.  She is on Lovenox and Coumadin (followed by vascular surgery).  INR is sub-therapeutic (1.16) on Coumadin 5 mg a day.  Symptomatically, she notes some rectal discharge.  She notes some dysuria.  She denies any bruising or bleeding.  Plan: 1.  Labs today:  CBC with diff, CMP, PT/INR. 2.  Urinalysis  and culture. 3.  Increase Coumadin to 6 mg 4 days a week and 5 mg 3 days (MWF) a week.   4.  Continue monthly B12 today and monthly. 5.  Encourage patient to take folic acid daily. 6.  Schedule follow-up with [facility]. 7.  RTC weekly for PT/INR until taken over by PCP. 8.  RTC in 1 week for MD assess and labs (PT/INR).   Lequita Asal, MD  12/21/2014, 10:57 AM

## 2014-12-22 ENCOUNTER — Inpatient Hospital Stay: Payer: Commercial Managed Care - HMO

## 2014-12-25 ENCOUNTER — Inpatient Hospital Stay: Payer: Commercial Managed Care - HMO

## 2014-12-25 ENCOUNTER — Telehealth: Payer: Self-pay | Admitting: Surgery

## 2014-12-25 ENCOUNTER — Other Ambulatory Visit: Payer: Self-pay | Admitting: *Deleted

## 2014-12-25 NOTE — Telephone Encounter (Signed)
Returned phone call to patient at this time. She is having some mild discomfort in her abdomen after she felt something pull while standing up a suitcase earlier. Patient denies any other symptoms. Explained to patient that she can see how she feels throughout the night and call me in the morning if pain is no better so that I may work her in with the surgeon in the office tomorrow. She verbalizes understanding of this.

## 2014-12-25 NOTE — Telephone Encounter (Signed)
Patient has called and stated that she had just picked up a suitcase and she felt something pop in her abdomen near her colostomy. She also states that it does hurt to stand and walk. She would like to know if this is something that will feel better in a few hours or if she should get checked out in the Emergency Department. Please call patient at number verified in chart.

## 2014-12-26 ENCOUNTER — Inpatient Hospital Stay: Payer: Commercial Managed Care - HMO

## 2014-12-26 ENCOUNTER — Other Ambulatory Visit: Payer: Self-pay | Admitting: Hematology and Oncology

## 2014-12-26 MED ORDER — OXYCODONE-ACETAMINOPHEN 10-325 MG PO TABS: 1.0000 | ORAL_TABLET | Freq: Four times a day (QID) | ORAL | Status: DC | PRN

## 2014-12-27 ENCOUNTER — Ambulatory Visit: Payer: Commercial Managed Care - HMO | Admitting: General Surgery

## 2014-12-27 ENCOUNTER — Inpatient Hospital Stay: Payer: Commercial Managed Care - HMO

## 2014-12-28 ENCOUNTER — Inpatient Hospital Stay (HOSPITAL_BASED_OUTPATIENT_CLINIC_OR_DEPARTMENT_OTHER): Payer: Commercial Managed Care - HMO | Admitting: Hematology and Oncology

## 2014-12-28 ENCOUNTER — Inpatient Hospital Stay: Payer: Commercial Managed Care - HMO

## 2014-12-28 VITALS — BP 146/85 | HR 76 | Temp 97.6°F | Resp 18 | Ht 66.5 in | Wt 194.7 lb

## 2014-12-28 DIAGNOSIS — M4800 Spinal stenosis, site unspecified: Secondary | ICD-10-CM

## 2014-12-28 DIAGNOSIS — F419 Anxiety disorder, unspecified: Secondary | ICD-10-CM

## 2014-12-28 DIAGNOSIS — F4310 Post-traumatic stress disorder, unspecified: Secondary | ICD-10-CM

## 2014-12-28 DIAGNOSIS — F17210 Nicotine dependence, cigarettes, uncomplicated: Secondary | ICD-10-CM

## 2014-12-28 DIAGNOSIS — R300 Dysuria: Secondary | ICD-10-CM

## 2014-12-28 DIAGNOSIS — Z7901 Long term (current) use of anticoagulants: Secondary | ICD-10-CM

## 2014-12-28 DIAGNOSIS — N941 Unspecified dyspareunia: Secondary | ICD-10-CM

## 2014-12-28 DIAGNOSIS — F329 Major depressive disorder, single episode, unspecified: Secondary | ICD-10-CM

## 2014-12-28 DIAGNOSIS — F209 Schizophrenia, unspecified: Secondary | ICD-10-CM

## 2014-12-28 DIAGNOSIS — I10 Essential (primary) hypertension: Secondary | ICD-10-CM

## 2014-12-28 DIAGNOSIS — J45909 Unspecified asthma, uncomplicated: Secondary | ICD-10-CM

## 2014-12-28 DIAGNOSIS — M797 Fibromyalgia: Secondary | ICD-10-CM

## 2014-12-28 DIAGNOSIS — C20 Malignant neoplasm of rectum: Secondary | ICD-10-CM

## 2014-12-28 DIAGNOSIS — E538 Deficiency of other specified B group vitamins: Secondary | ICD-10-CM

## 2014-12-28 DIAGNOSIS — K219 Gastro-esophageal reflux disease without esophagitis: Secondary | ICD-10-CM

## 2014-12-28 DIAGNOSIS — J449 Chronic obstructive pulmonary disease, unspecified: Secondary | ICD-10-CM

## 2014-12-28 DIAGNOSIS — F319 Bipolar disorder, unspecified: Secondary | ICD-10-CM

## 2014-12-28 DIAGNOSIS — D649 Anemia, unspecified: Secondary | ICD-10-CM

## 2014-12-28 DIAGNOSIS — K6289 Other specified diseases of anus and rectum: Secondary | ICD-10-CM | POA: Diagnosis not present

## 2014-12-28 DIAGNOSIS — R0602 Shortness of breath: Secondary | ICD-10-CM

## 2014-12-28 DIAGNOSIS — Z8701 Personal history of pneumonia (recurrent): Secondary | ICD-10-CM

## 2014-12-28 DIAGNOSIS — G4733 Obstructive sleep apnea (adult) (pediatric): Secondary | ICD-10-CM

## 2014-12-28 DIAGNOSIS — I82622 Acute embolism and thrombosis of deep veins of left upper extremity: Secondary | ICD-10-CM | POA: Diagnosis not present

## 2014-12-28 DIAGNOSIS — Z79899 Other long term (current) drug therapy: Secondary | ICD-10-CM

## 2014-12-28 DIAGNOSIS — G629 Polyneuropathy, unspecified: Secondary | ICD-10-CM

## 2014-12-28 DIAGNOSIS — G894 Chronic pain syndrome: Secondary | ICD-10-CM

## 2014-12-28 DIAGNOSIS — M5136 Other intervertebral disc degeneration, lumbar region: Secondary | ICD-10-CM

## 2014-12-28 DIAGNOSIS — E039 Hypothyroidism, unspecified: Secondary | ICD-10-CM

## 2014-12-28 DIAGNOSIS — R251 Tremor, unspecified: Secondary | ICD-10-CM

## 2014-12-28 DIAGNOSIS — C210 Malignant neoplasm of anus, unspecified: Secondary | ICD-10-CM

## 2014-12-28 DIAGNOSIS — Z808 Family history of malignant neoplasm of other organs or systems: Secondary | ICD-10-CM

## 2014-12-28 LAB — PROTIME-INR
INR: 1.21
Prothrombin Time: 15.5 seconds — ABNORMAL HIGH (ref 11.4–15.0)

## 2014-12-28 NOTE — Progress Notes (Addendum)
[facility] day:  12/28/2014   Chief Complaint: [name] is a 59 y.o. 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  HPI:  The patient was last seen in the medical oncology clinic on 12/21/2014.  At that time, she noted some rectal discharge.  She had some dysuria.  She received her monthly B12.  Coumadin was adjusted as her INR was sub-therapeutic (1.16).  She received B12.  During the interim, she voices no changes. She is a appointment with Dr. Phoebe Perch on 01/02/2015.  She notes rawness on her bottom secondary to hemorrhoids. She has not yet had her left upper extremity ultrasound; she is waiting insurance approval.  She continues to have perirectal pain. She did not increase her Coumadin and is continuing 5 mg a day (total weekly dose 35 mg)    Past Medical History  Diagnosis Date  . Schizophrenia ([place])   . Asthma   . GERD (gastroesophageal reflux disease)   . Anxiety   . Depression   . Bipolar disorder ([place])   . COPD (chronic obstructive pulmonary disease) ([place])   . Occasional tremors     right hand  . PTSD (post-traumatic stress disorder)   . Shortness of breath dyspnea   . Fibromyalgia   . DVT (deep venous thrombosis) ([place]) 2011    RUE  . Thyroid nodule   . DDD (degenerative disc disease), lumbar   . Spinal stenosis   . Peripheral neuropathy ([place])   . Rotator cuff tear     right  . Pneumonia 2011  . Hypothyroidism     no meds currently  . Anemia     during pregnancy only  . Hypertension     Off meds x 15 years-well controlled now per pt  . Squamous cell cancer, anus (HCC)   . Severe obstructive sleep apnea 06/27/2014  . DVT of upper extremity (deep vein thrombosis) ([place]) 10/14/2014    Past Surgical History  Procedure Laterality Date  . Foot surgery Right   . Tubal ligation    . Eye surgery Bilateral   .  Mouth surgery  2002  . Rectal biopsy N/A 05/08/2014    Procedure: BIOPSY RECTAL;  Surgeon: Marlyce Huge, MD;  Location: [facility];  Service: General;  Laterality: N/A;  . Evaluation under anesthesia with hemorrhoidectomy N/A 05/08/2014    Procedure: EXAM UNDER ANESTHESIA WITH HEMORRHOIDECTOMY;  Surgeon: Marlyce Huge, MD;  Location: [facility];  Service: General;  Laterality: N/A;  . Portacath placement N/A 05/20/2014    Procedure: INSERTION PORT-A-CATH;  Surgeon: Florene Glen, MD;  Location: [facility];  Service: General;  Laterality: N/A;  . Laparoscopic diverted colostomy N/A 05/26/2014    Procedure: LAPAROSCOPIC DIVERTED COLOSTOMY;  Surgeon: Marlyce Huge, MD;  Location: [facility];  Service: General;  Laterality: N/A;  . Peripheral vascular catheterization Left 10/16/2014    Procedure: Upper Extremity Venography with thrombectomy, port removal;  Surgeon: Algernon Huxley, MD;  Location: [facility];  Service: Cardiovascular;  Laterality: Left;  . Peripheral vascular catheterization  10/16/2014    Procedure: Upper Extremity Intervention;  Surgeon: Algernon Huxley, MD;  Location: [facility];  Service: Cardiovascular;;    Family History  Problem Relation Age of Onset  . Cancer Maternal Aunt   . Cancer Paternal Uncle   . Diabetes Mother   .  Thyroid disease Mother   . Kidney failure Mother   . Hypertension Mother   . Depression Mother   . Mental illness Son   . Aneurysm Maternal Grandmother   . Thyroid disease Maternal Grandmother   . Stroke Maternal Grandfather   . Hypertension Maternal Grandfather   . Diabetes Maternal Grandfather   . Heart disease Maternal Grandfather     MI  . Nephrolithiasis Daughter     Social History:  reports that she has been smoking Cigarettes.  She started smoking about 44 years ago. She has a 11 pack-year smoking history. She has never used smokeless tobacco. She reports that she uses illicit drugs (Marijuana). She reports that  she does not drink alcohol.  The patient is alone today.  Allergies:  Allergies  Allergen Reactions  . Sulfa Antibiotics Hives    Current Medications: Current Outpatient Prescriptions  Medication Sig Dispense Refill  . albuterol (PROVENTIL HFA;VENTOLIN HFA) 108 (90 BASE) MCG/ACT inhaler Inhale 1 puff into the lungs every 6 (six) hours as needed for wheezing or shortness of breath.    . ARIPiprazole (ABILIFY) 2 MG tablet Take 1 tablet (2 mg total) by mouth daily. 30 tablet 2  . baclofen (LIORESAL) 10 MG tablet Take 1 tablet (10 mg total) by mouth 3 (three) times daily. 90 each 1  . budesonide-formoterol (SYMBICORT) 160-4.5 MCG/ACT inhaler Inhale 2 puffs into the lungs 2 (two) times daily. 1 Inhaler 12  . Calcium Carb-Cholecalciferol (CALCIUM 600+D) 600-800 MG-UNIT TABS Take 1 tablet by mouth 2 (two) times daily.     . clonazePAM (KLONOPIN) 1 MG tablet Take 1 tablet (1 mg total) by mouth 3 (three) times daily as needed for anxiety. 90 tablet 2  . DULoxetine (CYMBALTA) 60 MG capsule Take 1 capsule (60 mg total) by mouth daily. 30 capsule 2  . escitalopram (LEXAPRO) 20 MG tablet Take 1 tablet (20 mg total) by mouth daily. 30 tablet 2  . oxybutynin (DITROPAN) 5 MG tablet Take 5 mg by mouth 2 (two) times daily.    Marland Kitchen oxyCODONE-acetaminophen (PERCOCET) 10-325 MG tablet Take 1 tablet by mouth every 6 (six) hours as needed for pain. 60 tablet 0  . pregabalin (LYRICA) 150 MG capsule Take 1 capsule (150 mg total) by mouth 2 (two) times daily. 180 capsule 1  . ranitidine (ZANTAC) 150 MG tablet Take 150 mg by mouth 2 (two) times daily.    Marland Kitchen senna (SENOKOT) 8.6 MG tablet Take 2 tablets by mouth 2 (two) times daily as needed for constipation.    . simvastatin (ZOCOR) 40 MG tablet Take 40 mg by mouth at bedtime.     . sucralfate (CARAFATE) 1 G tablet Take 1 tablet (1 g total) by mouth 3 (three) times daily. Dissolve tablet in 2-3 tbsp of warm water; swish and swallow. 90 tablet 3  . tiotropium (SPIRIVA) 18  MCG inhalation capsule Place 18 mcg into inhaler and inhale daily.    . traMADol (ULTRAM) 50 MG tablet Take 50 mg by mouth daily as needed.     . warfarin (COUMADIN) 1 MG tablet Take 1 tablet (1 mg total) by mouth every Tuesday, Thursday, Saturday, and Sunday at 6 PM. 30 tablet 0  . warfarin (COUMADIN) 5 MG tablet Take 1 tablet (5 mg total) by mouth daily. 30 tablet 0  . zolpidem (AMBIEN) 10 MG tablet Take 1 tablet (10 mg total) by mouth at bedtime as needed for sleep. 30 tablet 2  . [DISCONTINUED] apixaban (ELIQUIS) 5 MG TABS  tablet Take 1 tablet (5 mg total) by mouth 2 (two) times daily. 60 tablet 2   No current facility-administered medications for this visit.   Facility-Administered Medications Ordered in Other Visits  Medication Dose Route Frequency Provider Last Rate Last Dose  . heparin lock flush 100 unit/mL  500 Units Intravenous Once Lequita Asal, MD      . sodium chloride 0.9 % injection 10 mL  10 mL Intravenous PRN Lequita Asal, MD        Review of Systems:  GENERAL:  Feels about the same.  No fevers or sweats.  Weight  fluctuating. PERFORMANCE STATUS (ECOG):  2 HEENT:  No visual changes, runny nose, mouth sores or sore throat. Lungs: No shortness of breath or cough.  No hemoptysis. Cardiac:  No chest pain, palpitations, orthopnea, or PND. GI:  Colostomy.  Raw bottom.  Hemorrhoids.  No nausea, vomiting, constipation, melena or hematochezia. GU:  No urgency, frequency, dysuria, or hematuria. Musculoskeletal:  No back pain.  No joint pain.  No muscle tenderness. Extremities:  No pain or swelling. Skin:  Improvement in per-rectal breakdown since hyperbaric oxygen. Neuro:  No headache, numbness or weakness, balance or coordination issues. Endocrine:  No diabetes, thyroid issues, hot flashes or night sweats. Psych:  No mood changes or depression.  Anxiety improved since start of treatment. Pain:  Decreased pain associated with anal mass.  Still using oxycodone  prn. Review of systems:  All other systems reviewed and found to be negative.   Physical Exam: Blood pressure 146/85, pulse 76, temperature 97.6 F (36.4 C), temperature source Tympanic, resp. rate 18, height 5' 6.5" (1.689 m), weight 194 lb 10.7 oz (88.3 kg), SpO2 97 %.  GENERAL:  Chronically ill appearing woman lying on her side in the exam room in no acute distress.  MENTAL STATUS:  Alert and oriented to person, place and time. HEAD:  Long blonde/graying hair.  Normocephalic, atraumatic, face symmetric, no Cushingoid features. EYES:  Blue eyes.  No conjunctivitis or scleral icterus. RESPIRATORY:  Clear to auscultation without rales, wheezes or rhonchi. CARDIOVASCULAR:  Regular rate and rhythm without murmur, rub or gallop. ABDOMEN:  Soft, non-tender, with active bowel sounds, and no hepatosplenomegaly.  No masses. SKIN:  No other rashes, ulcers or lesions. EXTREMITIES: Mild left upper extremity edema.  No skin discoloration or tenderness.  No palpable cords. NEUROLOGICAL: Unremarkable. PSYCH:  Appropriate.   Appointment on 12/28/2014  Component Date Value Ref Range Status  . Prothrombin Time 12/28/2014 15.5* 11.4 - 15.0 seconds Final  . INR 12/28/2014 1.21   Final    Assessment:  MCKINNLEY COTTIER is a 59 y.o. 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  Chest, abdomen, and pelvic CT scan on 05/19/2014 revealed no evidence of metastatic disease.  There was a 4 mm RUL pulmonary nodule.  There were borderline (13 mm) enlarged iliac nodes.  There was a 4.7 x 4.1 x 6.0 cm lower rectal and anal mass consistent with anal cancer.  She underwent diverting colostomy on 05/26/2014.    She received concurrent chemotherapy (5FU and mitomycin-C) and radiation (06/29/2014 - 07/31/2014).  She missed 3 days of radiation (07/04-07/06/2014).  She has  been off radiation since 08/23/2014 secondary to perirectal breakdown.  She received hyperbaric oxygen beginning 09/13/2014 (on hold since thrombosis).  She has macrocytic RBC indices.  Work-up  on 09/28/2014 revealed a B12 deficiency (169) and folate deficiency (5.2).  TSH was normal.  She receives B12 (last 12/21/2014).  She is supposed to take folic acid (initially not taking secondary to costs).  She developed a left upper extremity DVT on 10/14/2014.  She underwent thrombolysis, thrombectomy, and port removal on 10/16/2014.  She is on Lovenox and Coumadin (followed by vascular surgery).  INR remains sub-therapeutic (1.21) on Coumadin 5 mg a day.  Symptomatically, she notes some rectal discharge.  She denies any bruising or bleeding.  Plan: 1.  Labs today:  PT/INR. 2.  Increase Coumadin to 6 mg 4 days a week and 5 mg 3 days a week.. 3.  Schedule B12 monthly.  Continue folic acid daily. 4.  Follow-up with Dr. Phoebe Perch, surgery, regarding follow-up anoscopy and colostomy. 5.  Referral to pain clinic. 6.  RTC on 12/27, 01/03, and 01/10 for labs (PT/INR) and adjustment of Coumadin. 7.  RTC on 01/23/2015 for  MD assesment.   Lequita Asal, MD  12/28/2014, 1:06 PM

## 2014-12-29 ENCOUNTER — Inpatient Hospital Stay: Payer: Commercial Managed Care - HMO

## 2014-12-29 ENCOUNTER — Other Ambulatory Visit: Payer: Self-pay

## 2014-12-29 DIAGNOSIS — I82629 Acute embolism and thrombosis of deep veins of unspecified upper extremity: Secondary | ICD-10-CM

## 2015-01-01 ENCOUNTER — Inpatient Hospital Stay: Payer: Commercial Managed Care - HMO

## 2015-01-02 ENCOUNTER — Inpatient Hospital Stay: Payer: Commercial Managed Care - HMO

## 2015-01-02 ENCOUNTER — Inpatient Hospital Stay: Payer: Commercial Managed Care - HMO | Attending: Hematology and Oncology

## 2015-01-02 ENCOUNTER — Encounter: Payer: Self-pay | Admitting: Surgery

## 2015-01-02 ENCOUNTER — Telehealth: Payer: Self-pay | Admitting: Unknown Physician Specialty

## 2015-01-02 ENCOUNTER — Ambulatory Visit (INDEPENDENT_AMBULATORY_CARE_PROVIDER_SITE_OTHER): Payer: Commercial Managed Care - HMO | Admitting: Surgery

## 2015-01-02 VITALS — BP 121/76 | HR 80 | Temp 99.6°F | Ht 67.0 in | Wt 195.0 lb

## 2015-01-02 DIAGNOSIS — C210 Malignant neoplasm of anus, unspecified: Secondary | ICD-10-CM | POA: Diagnosis not present

## 2015-01-02 NOTE — Patient Instructions (Signed)
Please call in May 2017 to get an appointment for June with Dr. Burt Knack to discuss colostomy reversal.

## 2015-01-02 NOTE — Telephone Encounter (Signed)
Please let her know that [name] is out of the office right now; ask her to please continue with whomever is following right now and ask if this question is okay to wait until [name] returns If not okay, or she doesn't have anyone monitoring her right now, please send back to me

## 2015-01-02 NOTE — Telephone Encounter (Signed)
Routing to provider  

## 2015-01-02 NOTE — Telephone Encounter (Signed)
Pt would like to know if CW can monitor her INR.

## 2015-01-02 NOTE — Progress Notes (Signed)
Outpatient Surgical Follow Up  01/02/2015  [name] is an 59 y.o. female.   CC: squamous Cell carcinoma of the anus  HPI: This a patient with S CCA of the anus has finished her chemotherapy and Part way through her radiation therapy before experiencing extensive breakdown of her skin which is completely healed. She also had a DVT of her upper extremity necessitating port removal. She describes lower abdominal pain during sex and the sensation that "she is a virgin all over again". Nausea vomiting fevers or chills she is having loose stools but no further skin breakdown she has seen wound care clinic and had hyperbaric therapy. I placed a telephone call to Dr. Mike Gip and discussed this patient's care and detail no further surgery is necessary other than closure of her colostomy at some point.  Past Medical History  Diagnosis Date  . Schizophrenia ([place])   . Asthma   . GERD (gastroesophageal reflux disease)   . Anxiety   . Depression   . Bipolar disorder ([place])   . COPD (chronic obstructive pulmonary disease) ([place])   . Occasional tremors     right hand  . PTSD (post-traumatic stress disorder)   . Shortness of breath dyspnea   . Fibromyalgia   . DVT (deep venous thrombosis) ([place]) 2011    RUE  . Thyroid nodule   . DDD (degenerative disc disease), lumbar   . Spinal stenosis   . Peripheral neuropathy ([place])   . Rotator cuff tear     right  . Pneumonia 2011  . Hypothyroidism     no meds currently  . Anemia     during pregnancy only  . Hypertension     Off meds x 15 years-well controlled now per pt  . Squamous cell cancer, anus (HCC)   . Severe obstructive sleep apnea 06/27/2014  . DVT of upper extremity (deep vein thrombosis) ([place]) 10/14/2014    Past Surgical History  Procedure Laterality Date  . Foot surgery Right   . Tubal ligation    . Eye surgery Bilateral   . Mouth surgery  2002  . Rectal biopsy N/A 05/08/2014    Procedure: BIOPSY RECTAL;  Surgeon: Marlyce Huge, MD;  Location: [facility];  Service: General;  Laterality: N/A;  . Evaluation under anesthesia with hemorrhoidectomy N/A 05/08/2014    Procedure: EXAM UNDER ANESTHESIA WITH HEMORRHOIDECTOMY;  Surgeon: Marlyce Huge, MD;  Location: [facility];  Service: General;  Laterality: N/A;  . Portacath placement N/A 05/20/2014    Procedure: INSERTION PORT-A-CATH;  Surgeon: Florene Glen, MD;  Location: [facility];  Service: General;  Laterality: N/A;  . Laparoscopic diverted colostomy N/A 05/26/2014    Procedure: LAPAROSCOPIC DIVERTED COLOSTOMY;  Surgeon: Marlyce Huge, MD;  Location: [facility];  Service: General;  Laterality: N/A;  . Peripheral vascular catheterization Left 10/16/2014    Procedure: Upper Extremity Venography with thrombectomy, port removal;  Surgeon: Algernon Huxley, MD;  Location: [facility];  Service: Cardiovascular;  Laterality: Left;  . Peripheral vascular catheterization  10/16/2014    Procedure: Upper Extremity Intervention;  Surgeon: Algernon Huxley, MD;  Location: [place] [facility];  Service: Cardiovascular;;    Family History  Problem Relation Age of Onset  . Cancer Maternal Aunt   . Cancer Paternal Uncle   . Diabetes Mother   . Thyroid disease Mother   . Kidney failure Mother   . Hypertension Mother   . Depression Mother   . Mental illness Son   .  Aneurysm Maternal Grandmother   . Thyroid disease Maternal Grandmother   . Stroke Maternal Grandfather   . Hypertension Maternal Grandfather   . Diabetes Maternal Grandfather   . Heart disease Maternal Grandfather     MI  . Nephrolithiasis Daughter     Social History:  reports that she has been smoking Cigarettes.  She started smoking about 44 years ago. She has a 11 pack-year smoking history. She has never used smokeless tobacco. She reports that she uses illicit drugs (Marijuana). She reports that she does not drink alcohol.  Allergies:  Allergies  Allergen Reactions  . Sulfa Antibiotics Hives     Medications reviewed.   Review of Systems:   Review of Systems  Constitutional: Negative for fever and chills.  HENT: Negative.   Eyes: Negative.   Respiratory: Negative.   Cardiovascular: Negative.   Gastrointestinal: Positive for abdominal pain and diarrhea. Negative for heartburn, nausea, vomiting, constipation, blood in stool and melena.       Lower abdominal pain during sex  Genitourinary: Negative for dysuria and urgency.  Musculoskeletal: Negative.   Skin: Positive for rash.       Skin breakdown the anal  Neurological: Negative.   Endo/Heme/Allergies: Negative.   Psychiatric/Behavioral: Negative.      Physical Exam:  BP 121/76 mmHg  Pulse 80  Temp(Src) 99.6 F (37.6 C) (Oral)  Ht '5\' 7"'$  (1.702 m)  Wt 195 lb (88.451 kg)  BMI 30.53 kg/m2  Physical Exam  Constitutional: She is oriented to person, place, and time and well-developed, well-nourished, and in no distress. No distress.  HENT:  Head: Normocephalic and atraumatic.  Eyes: Pupils are equal, round, and reactive to light. Right eye exhibits no discharge. Left eye exhibits no discharge. No scleral icterus.  Neck: Normal range of motion. Neck supple.  Cardiovascular: Normal rate, regular rhythm and normal heart sounds.   Pulmonary/Chest: Effort normal and breath sounds normal. No respiratory distress. She has no wheezes. She has no rales.  Abdominal: Soft. She exhibits no distension. There is no tenderness. There is no rebound and no guarding.  Genitourinary:  No perianal skin breakdown at this time Radiation pigment changes of the over the lower abdomen and perianal area  Musculoskeletal: Normal range of motion. She exhibits no edema.  Lymphadenopathy:    She has no cervical adenopathy.  Neurological: She is alert and oriented to person, place, and time.  Skin: Skin is warm and dry. She is not diaphoretic.  Psychiatric: Mood and affect normal.  Vitals reviewed.     No results found for this or any  previous visit (from the past 48 hour(s)). No results found.  Assessment/Plan:  Telephone conversation with Dr. Mike Gip further surgery planned other than closure of her colostomy at some point. Patient is having considerable pain during sex and feels like she is a virgin again using her words. I've asked her to follow-up with Dr. Donella Stade and starting her radiation changes but currently her anal area has healed completely with her considerable problems at this point I would not recommend closure of her colostomy for at least 6 months she will follow-up with [name] mid year.  Florene Glen, MD, FACS

## 2015-01-03 ENCOUNTER — Inpatient Hospital Stay: Payer: Commercial Managed Care - HMO

## 2015-01-03 NOTE — Telephone Encounter (Signed)
Called and left patient a voicemail asking for her to please return my call.  

## 2015-01-04 ENCOUNTER — Inpatient Hospital Stay: Payer: Commercial Managed Care - HMO

## 2015-01-04 DIAGNOSIS — I82629 Acute embolism and thrombosis of deep veins of unspecified upper extremity: Secondary | ICD-10-CM

## 2015-01-04 DIAGNOSIS — C20 Malignant neoplasm of rectum: Secondary | ICD-10-CM | POA: Diagnosis not present

## 2015-01-04 LAB — PROTIME-INR
INR: 1.16
Prothrombin Time: 15 seconds (ref 11.4–15.0)

## 2015-01-04 NOTE — Telephone Encounter (Signed)
This is fine.  We can follow it as long as it has been stable.

## 2015-01-04 NOTE — Telephone Encounter (Signed)
Called and left patient a voicemail asking for her to please return my call.  

## 2015-01-04 NOTE — Telephone Encounter (Signed)
Patient returned call and scheduled a pt/inr appointment for 01/15/15.

## 2015-01-05 ENCOUNTER — Other Ambulatory Visit: Payer: Self-pay | Admitting: Hematology and Oncology

## 2015-01-05 ENCOUNTER — Telehealth: Payer: Self-pay

## 2015-01-05 NOTE — Telephone Encounter (Signed)
Called pt per MD request to check if pt is taking coumadin as directed. Phone call goes strait to voicemail.  Left message for pt to return phone call.  Also left Message that we will be closed Monday and for pt to please try to call back today.

## 2015-01-09 ENCOUNTER — Telehealth: Payer: Self-pay | Admitting: Hematology and Oncology

## 2015-01-09 ENCOUNTER — Other Ambulatory Visit: Payer: Self-pay | Admitting: *Deleted

## 2015-01-09 ENCOUNTER — Inpatient Hospital Stay: Payer: Commercial Managed Care - HMO | Attending: Hematology and Oncology

## 2015-01-09 DIAGNOSIS — Z933 Colostomy status: Secondary | ICD-10-CM | POA: Insufficient documentation

## 2015-01-09 DIAGNOSIS — R05 Cough: Secondary | ICD-10-CM | POA: Diagnosis not present

## 2015-01-09 DIAGNOSIS — G4730 Sleep apnea, unspecified: Secondary | ICD-10-CM | POA: Insufficient documentation

## 2015-01-09 DIAGNOSIS — R0602 Shortness of breath: Secondary | ICD-10-CM | POA: Diagnosis not present

## 2015-01-09 DIAGNOSIS — I10 Essential (primary) hypertension: Secondary | ICD-10-CM | POA: Diagnosis not present

## 2015-01-09 DIAGNOSIS — M797 Fibromyalgia: Secondary | ICD-10-CM | POA: Diagnosis not present

## 2015-01-09 DIAGNOSIS — Z86718 Personal history of other venous thrombosis and embolism: Secondary | ICD-10-CM | POA: Insufficient documentation

## 2015-01-09 DIAGNOSIS — Z8701 Personal history of pneumonia (recurrent): Secondary | ICD-10-CM | POA: Insufficient documentation

## 2015-01-09 DIAGNOSIS — M4800 Spinal stenosis, site unspecified: Secondary | ICD-10-CM | POA: Diagnosis not present

## 2015-01-09 DIAGNOSIS — E041 Nontoxic single thyroid nodule: Secondary | ICD-10-CM | POA: Diagnosis not present

## 2015-01-09 DIAGNOSIS — K219 Gastro-esophageal reflux disease without esophagitis: Secondary | ICD-10-CM | POA: Diagnosis not present

## 2015-01-09 DIAGNOSIS — F319 Bipolar disorder, unspecified: Secondary | ICD-10-CM | POA: Insufficient documentation

## 2015-01-09 DIAGNOSIS — M5136 Other intervertebral disc degeneration, lumbar region: Secondary | ICD-10-CM | POA: Insufficient documentation

## 2015-01-09 DIAGNOSIS — I82629 Acute embolism and thrombosis of deep veins of unspecified upper extremity: Secondary | ICD-10-CM

## 2015-01-09 DIAGNOSIS — R109 Unspecified abdominal pain: Secondary | ICD-10-CM | POA: Insufficient documentation

## 2015-01-09 DIAGNOSIS — E039 Hypothyroidism, unspecified: Secondary | ICD-10-CM | POA: Insufficient documentation

## 2015-01-09 DIAGNOSIS — F329 Major depressive disorder, single episode, unspecified: Secondary | ICD-10-CM | POA: Diagnosis not present

## 2015-01-09 DIAGNOSIS — J449 Chronic obstructive pulmonary disease, unspecified: Secondary | ICD-10-CM | POA: Diagnosis not present

## 2015-01-09 DIAGNOSIS — F4310 Post-traumatic stress disorder, unspecified: Secondary | ICD-10-CM | POA: Insufficient documentation

## 2015-01-09 DIAGNOSIS — F419 Anxiety disorder, unspecified: Secondary | ICD-10-CM | POA: Diagnosis not present

## 2015-01-09 DIAGNOSIS — F209 Schizophrenia, unspecified: Secondary | ICD-10-CM | POA: Diagnosis not present

## 2015-01-09 DIAGNOSIS — J45909 Unspecified asthma, uncomplicated: Secondary | ICD-10-CM | POA: Insufficient documentation

## 2015-01-09 DIAGNOSIS — G8929 Other chronic pain: Secondary | ICD-10-CM | POA: Diagnosis not present

## 2015-01-09 DIAGNOSIS — C20 Malignant neoplasm of rectum: Secondary | ICD-10-CM | POA: Insufficient documentation

## 2015-01-09 LAB — PROTIME-INR
INR: 1.2
Prothrombin Time: 15.4 s — ABNORMAL HIGH (ref 11.4–15.0)

## 2015-01-09 MED ORDER — OXYCODONE-ACETAMINOPHEN 10-325 MG PO TABS: 1.0000 | ORAL_TABLET | Freq: Four times a day (QID) | ORAL | Status: DC | PRN

## 2015-01-09 NOTE — Telephone Encounter (Signed)
Re:  INR  Called patient today about her INR of 1.2.  We had tried multiple times last week to adjust her Coumadin.  She has been having trouble with her phone and sometimes has the volume turned down.  For the past 2 weeks, she has been taking Coumadin 6 mg on Mon, Wed, Fri and Sunday and 5 mg on Tuesday, Thursday, Saturday (total weekly dose : 6 mg x 4 + 5 mg x 3 = 39 mg).  Her new dose wtll be Coumadin 6 mg 5 days a week and 7 mg 2 days a week (spread out).  Total weekly dose 44 mg.  She states that Dr. [name] will be taking over her Coumadin on 01/15/2015.  If level improving, would not adjust dose at that time, but recheck next week.  Lequita Asal, MD

## 2015-01-09 NOTE — Telephone Encounter (Signed)
Has an appt today for labs and does not want to come back tomorrow to pick up rx since she will be here today

## 2015-01-15 ENCOUNTER — Ambulatory Visit: Payer: Self-pay | Admitting: Unknown Physician Specialty

## 2015-01-16 ENCOUNTER — Inpatient Hospital Stay: Payer: Commercial Managed Care - HMO

## 2015-01-17 ENCOUNTER — Other Ambulatory Visit: Payer: Self-pay | Admitting: Hematology and Oncology

## 2015-01-22 ENCOUNTER — Encounter: Payer: Self-pay | Admitting: *Deleted

## 2015-01-22 ENCOUNTER — Institutional Professional Consult (permissible substitution): Payer: Commercial Managed Care - HMO | Admitting: Pulmonary Disease

## 2015-01-22 ENCOUNTER — Ambulatory Visit: Payer: Commercial Managed Care - HMO | Admitting: Psychiatry

## 2015-01-23 ENCOUNTER — Inpatient Hospital Stay (HOSPITAL_BASED_OUTPATIENT_CLINIC_OR_DEPARTMENT_OTHER): Payer: Commercial Managed Care - HMO | Admitting: Hematology and Oncology

## 2015-01-23 ENCOUNTER — Inpatient Hospital Stay: Payer: Commercial Managed Care - HMO

## 2015-01-23 ENCOUNTER — Encounter: Payer: Self-pay | Admitting: Unknown Physician Specialty

## 2015-01-23 ENCOUNTER — Ambulatory Visit (INDEPENDENT_AMBULATORY_CARE_PROVIDER_SITE_OTHER): Payer: Commercial Managed Care - HMO | Admitting: Unknown Physician Specialty

## 2015-01-23 ENCOUNTER — Other Ambulatory Visit: Payer: Self-pay

## 2015-01-23 VITALS — BP 134/79 | HR 83 | Temp 98.4°F | Ht 65.6 in | Wt 197.0 lb

## 2015-01-23 VITALS — BP 147/71 | HR 89 | Temp 99.5°F | Resp 18 | Ht 67.0 in | Wt 196.7 lb

## 2015-01-23 DIAGNOSIS — E039 Hypothyroidism, unspecified: Secondary | ICD-10-CM

## 2015-01-23 DIAGNOSIS — J449 Chronic obstructive pulmonary disease, unspecified: Secondary | ICD-10-CM

## 2015-01-23 DIAGNOSIS — G4730 Sleep apnea, unspecified: Secondary | ICD-10-CM

## 2015-01-23 DIAGNOSIS — Z8701 Personal history of pneumonia (recurrent): Secondary | ICD-10-CM

## 2015-01-23 DIAGNOSIS — F329 Major depressive disorder, single episode, unspecified: Secondary | ICD-10-CM

## 2015-01-23 DIAGNOSIS — R05 Cough: Secondary | ICD-10-CM

## 2015-01-23 DIAGNOSIS — F4310 Post-traumatic stress disorder, unspecified: Secondary | ICD-10-CM

## 2015-01-23 DIAGNOSIS — Z933 Colostomy status: Secondary | ICD-10-CM

## 2015-01-23 DIAGNOSIS — J45909 Unspecified asthma, uncomplicated: Secondary | ICD-10-CM

## 2015-01-23 DIAGNOSIS — C20 Malignant neoplasm of rectum: Secondary | ICD-10-CM | POA: Diagnosis not present

## 2015-01-23 DIAGNOSIS — R0602 Shortness of breath: Secondary | ICD-10-CM

## 2015-01-23 DIAGNOSIS — F319 Bipolar disorder, unspecified: Secondary | ICD-10-CM

## 2015-01-23 DIAGNOSIS — M5136 Other intervertebral disc degeneration, lumbar region: Secondary | ICD-10-CM

## 2015-01-23 DIAGNOSIS — M4800 Spinal stenosis, site unspecified: Secondary | ICD-10-CM

## 2015-01-23 DIAGNOSIS — F419 Anxiety disorder, unspecified: Secondary | ICD-10-CM

## 2015-01-23 DIAGNOSIS — C210 Malignant neoplasm of anus, unspecified: Secondary | ICD-10-CM

## 2015-01-23 DIAGNOSIS — D689 Coagulation defect, unspecified: Secondary | ICD-10-CM | POA: Diagnosis not present

## 2015-01-23 DIAGNOSIS — M797 Fibromyalgia: Secondary | ICD-10-CM

## 2015-01-23 DIAGNOSIS — Z86718 Personal history of other venous thrombosis and embolism: Secondary | ICD-10-CM

## 2015-01-23 DIAGNOSIS — R109 Unspecified abdominal pain: Secondary | ICD-10-CM | POA: Diagnosis not present

## 2015-01-23 DIAGNOSIS — K219 Gastro-esophageal reflux disease without esophagitis: Secondary | ICD-10-CM

## 2015-01-23 DIAGNOSIS — G8929 Other chronic pain: Secondary | ICD-10-CM | POA: Diagnosis not present

## 2015-01-23 DIAGNOSIS — K921 Melena: Secondary | ICD-10-CM

## 2015-01-23 DIAGNOSIS — R1030 Lower abdominal pain, unspecified: Secondary | ICD-10-CM

## 2015-01-23 DIAGNOSIS — F209 Schizophrenia, unspecified: Secondary | ICD-10-CM

## 2015-01-23 DIAGNOSIS — I10 Essential (primary) hypertension: Secondary | ICD-10-CM

## 2015-01-23 DIAGNOSIS — I82622 Acute embolism and thrombosis of deep veins of left upper extremity: Secondary | ICD-10-CM

## 2015-01-23 DIAGNOSIS — E041 Nontoxic single thyroid nodule: Secondary | ICD-10-CM

## 2015-01-23 LAB — CBC WITH DIFFERENTIAL/PLATELET
Basophils Absolute: 0 10*3/uL (ref 0–0.1)
Basophils Relative: 1 %
Eosinophils Absolute: 0.2 10*3/uL (ref 0–0.7)
Eosinophils Relative: 4 %
HCT: 35.3 % (ref 35.0–47.0)
Hemoglobin: 11.8 g/dL — ABNORMAL LOW (ref 12.0–16.0)
Lymphocytes Relative: 14 %
Lymphs Abs: 0.7 10*3/uL — ABNORMAL LOW (ref 1.0–3.6)
MCH: 31.3 pg (ref 26.0–34.0)
MCHC: 33.6 g/dL (ref 32.0–36.0)
MCV: 93.3 fL (ref 80.0–100.0)
Monocytes Absolute: 0.5 10*3/uL (ref 0.2–0.9)
Monocytes Relative: 9 %
Neutro Abs: 4 10*3/uL (ref 1.4–6.5)
Neutrophils Relative %: 72 %
Platelets: 176 10*3/uL (ref 150–440)
RBC: 3.78 MIL/uL — ABNORMAL LOW (ref 3.80–5.20)
RDW: 15.6 % — ABNORMAL HIGH (ref 11.5–14.5)
WBC: 5.5 10*3/uL (ref 3.6–11.0)

## 2015-01-23 LAB — HEPATIC FUNCTION PANEL
ALT: 11 U/L — ABNORMAL LOW (ref 14–54)
AST: 26 U/L (ref 15–41)
Albumin: 3.1 g/dL — ABNORMAL LOW (ref 3.5–5.0)
Alkaline Phosphatase: 77 U/L (ref 38–126)
Bilirubin, Direct: 0.1 mg/dL (ref 0.1–0.5)
Indirect Bilirubin: 0.6 mg/dL (ref 0.3–0.9)
Total Bilirubin: 0.7 mg/dL (ref 0.3–1.2)
Total Protein: 6.8 g/dL (ref 6.5–8.1)

## 2015-01-23 LAB — PROTIME-INR
INR: 1.12
Prothrombin Time: 14.6 seconds (ref 11.4–15.0)

## 2015-01-23 LAB — AMYLASE: Amylase: 43 U/L (ref 28–100)

## 2015-01-23 LAB — COAGUCHEK XS/INR WAIVED
INR: 1.1 (ref 0.9–1.1)
PROTHROMBIN TIME: 13.5 s

## 2015-01-23 LAB — LIPASE, BLOOD: Lipase: 28 U/L (ref 11–51)

## 2015-01-23 MED ORDER — WARFARIN SODIUM 1 MG PO TABS: 1.0000 mg | ORAL_TABLET | Freq: Every day | ORAL | Status: DC

## 2015-01-23 MED ORDER — OXYCODONE-ACETAMINOPHEN 10-325 MG PO TABS: 1.0000 | ORAL_TABLET | Freq: Four times a day (QID) | ORAL | Status: DC | PRN

## 2015-01-23 MED ORDER — PREGABALIN 200 MG PO CAPS: 200.0000 mg | ORAL_CAPSULE | Freq: Two times a day (BID) | ORAL | Status: DC

## 2015-01-23 NOTE — Progress Notes (Signed)
BP 134/79 mmHg  Pulse 83  Temp(Src) 98.4 F (36.9 C)  Ht 5' 5.6" (1.666 m)  Wt 197 lb (89.359 kg)  BMI 32.19 kg/m2  SpO2 94%   Subjective:    Patient ID: [name], female    DOB: Aug 27, 1955, 60 y.o.   MRN: [id number]  CC: Coumadin management  She would like [name] to start managing her coumadin.  She checks it once a week  HPI: This patient is a 60 y.o. female who presents for coumadin management. The expected duration of coumadin treatment is lifelong The reason for anticoagulation is  thombophlebitis.  Present Coumadin dose: Goal: 2.0-3.0 The patient does not have an active anticoagulation episode. Excessive bruising: no Nose bleeding: no Rectal bleeding: yes as she is bleeding into her colostomy bag.  Hematology referred her to GI Prolonged menstrual cycles: N/A Eating diet with consistent amounts of foods containing Vitamin K:no Any recent antibiotic use? no  Pain/Fibromyalgia Finds that the 150 mg Lyrica not working as well as 200 mg and would like to go back to that.   Relevant past medical, surgical, family and social history reviewed and updated as indicated. Interim medical history since our last visit reviewed. Allergies and medications reviewed and updated.  ROS: Per HPI unless specifically indicated above     Objective:    BP 134/79 mmHg  Pulse 83  Temp(Src) 98.4 F (36.9 C)  Ht 5' 5.6" (1.666 m)  Wt 197 lb (89.359 kg)  BMI 32.19 kg/m2  SpO2 94%  Wt Readings from Last 3 Encounters:  01/23/15 197 lb (89.359 kg)  01/23/15 196 lb 10.4 oz (89.2 kg)  01/02/15 195 lb (88.451 kg)     General: Well appearing, well nourished in no distress.  Normal mood and affect. Skin: No excessive bruising or rash  Coumadin dose: 7 mg 2 days a week.  6 mg 5 days/week.    INR: 1.1 PT 13.5  Last CBC:  Lab Results  Component Value Date   WBC 5.5 01/23/2015   HGB 11.8* 01/23/2015   HCT 35.3 01/23/2015   MCV 93.3 01/23/2015   PLT 176 01/23/2015    Results  for orders placed or performed in visit on 01/23/15  CBC with Differential  Result Value Ref Range   WBC 5.5 3.6 - 11.0 K/uL   RBC 3.78 (L) 3.80 - 5.20 MIL/uL   Hemoglobin 11.8 (L) 12.0 - 16.0 g/dL   HCT 35.3 35.0 - 47.0 %   MCV 93.3 80.0 - 100.0 fL   MCH 31.3 26.0 - 34.0 pg   MCHC 33.6 32.0 - 36.0 g/dL   RDW 15.6 (H) 11.5 - 14.5 %   Platelets 176 150 - 440 K/uL   Neutrophils Relative % 72 %   Neutro Abs 4.0 1.4 - 6.5 K/uL   Lymphocytes Relative 14 %   Lymphs Abs 0.7 (L) 1.0 - 3.6 K/uL   Monocytes Relative 9 %   Monocytes Absolute 0.5 0.2 - 0.9 K/uL   Eosinophils Relative 4 %   Eosinophils Absolute 0.2 0 - 0.7 K/uL   Basophils Relative 1 %   Basophils Absolute 0.0 0 - 0.1 K/uL  Protime-INR  Result Value Ref Range   Prothrombin Time 14.6 11.4 - 15.0 seconds   INR 1.12   Amylase  Result Value Ref Range   Amylase 43 28 - 100 U/L  Lipase, blood  Result Value Ref Range   Lipase 28 11 - 51 U/L  Hepatic function panel  Result  Value Ref Range   Total Protein 6.8 6.5 - 8.1 g/dL   Albumin 3.1 (L) 3.5 - 5.0 g/dL   AST 26 15 - 41 U/L   ALT 11 (L) 14 - 54 U/L   Alkaline Phosphatase 77 38 - 126 U/L   Total Bilirubin 0.7 0.3 - 1.2 mg/dL   Bilirubin, Direct 0.1 0.1 - 0.5 mg/dL   Indirect Bilirubin 0.6 0.3 - 0.9 mg/dL       Assessment:     ICD-9-CM ICD-10-CM   1. Coagulation disorder (HCC) 286.9 D68.9 CoaguChek XS/INR Waived   2.  Fibromyalgia Increase Lyrica to 200 mg.    Plan:   Discussed current plan face-to-face with patient. For coumadin dosing, elected to change dose to 7 mg 4 days/week and 6 for the others.   Will plan to recheck INR in 1 week.

## 2015-01-28 ENCOUNTER — Other Ambulatory Visit: Payer: Self-pay | Admitting: Hematology and Oncology

## 2015-01-29 ENCOUNTER — Ambulatory Visit
Admission: RE | Admit: 2015-01-29 | Discharge: 2015-01-29 | Disposition: A | Discharge status: Home / Self Care | Payer: Commercial Managed Care - HMO | Source: Ambulatory Visit | Attending: Radiation Oncology | Admitting: Radiation Oncology

## 2015-01-29 ENCOUNTER — Inpatient Hospital Stay: Payer: Commercial Managed Care - HMO

## 2015-01-29 ENCOUNTER — Encounter: Payer: Self-pay | Admitting: Radiation Oncology

## 2015-01-29 VITALS — BP 142/71 | HR 85 | Temp 96.2°F | Wt 197.8 lb

## 2015-01-29 DIAGNOSIS — C210 Malignant neoplasm of anus, unspecified: Secondary | ICD-10-CM

## 2015-01-29 DIAGNOSIS — E538 Deficiency of other specified B group vitamins: Secondary | ICD-10-CM

## 2015-01-29 DIAGNOSIS — C20 Malignant neoplasm of rectum: Secondary | ICD-10-CM | POA: Diagnosis not present

## 2015-01-29 MED ORDER — CYANOCOBALAMIN 1000 MCG/ML IJ SOLN
1000.0000 ug | Freq: Once | INTRAMUSCULAR | Status: AC
  Administered 2015-01-29: 1000 ug via INTRAMUSCULAR
  Filled 2015-01-29: qty 1

## 2015-01-29 NOTE — Progress Notes (Signed)
Radiation Oncology Follow up Note  Name: EVALYN SHULTIS   Date:   01/29/2015 MRN:  [id number] DOB: 15-Dec-1955    This 60 y.o. female presents to the clinic today for follow-up for anal cancer now 5 months out bimodality treatment.  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 Prior to completing her radiation therapy D she does develop extensive skin breakdown from tumor involving the rectal area and underwent hyperbaric oxygen although that was discontinued when she developed a DVT of her left upper extremity associated with her Port-A-Cath. Patient has multiple comorbidities including schizophrenia asthma depression bipolar disorder COPD fibromyalgia and hypertension. She has a diverting colostomy which was performed in May 2016. She's been having some blood in her colostomy that is being evaluated by GI. She's also been in discussions with surgery to have a revision of her colostomy. She states she has far less pain in the rectal area and that is healing up well.  COMPLICATIONS OF TREATMENT: none  FOLLOW UP COMPLIANCE: keeps appointments   PHYSICAL EXAM:  BP 142/71 mmHg  Pulse 85  Temp(Src) 96.2 F (35.7 C)  Wt 197 lb 12 oz (89.7 kg) In the rectal area there are still some granulation tissue healing and. No evidence of mass or nodularity in the anal canal is noted on palpation. Well-developed well-nourished patient in NAD. HEENT reveals PERLA, EOMI, discs not visualized.  Oral cavity is clear. No oral mucosal lesions are identified. Neck is clear without evidence of cervical or supraclavicular adenopathy. Lungs are clear to A&P. Cardiac examination is essentially unremarkable with regular rate and rhythm without murmur rub or  thrill. Abdomen is benign with no organomegaly or masses noted. Motor sensory and DTR levels are equal and symmetric in the upper and lower extremities. Cranial nerves II through XII are grossly intact. Proprioception is intact. No peripheral adenopathy or edema is identified. No motor or sensory levels are noted. Crude visual fields are within normal range.  RADIOLOGY RESULTS: No films for review at this time  PLAN: At the present time patient is doing well she has no apparent evidence of disease at this time. She's being closely followed by medical oncology as well as GI and surgeon. They are in discussions about revising her diverting colostomy. I have asked to see her back in 6 months for follow-up. I see no additional role at this time for any further radiation therapy.  I would like to take this opportunity for allowing me to participate in the care of your patient.Armstead Peaks., MD

## 2015-01-30 ENCOUNTER — Encounter: Payer: Self-pay | Admitting: Unknown Physician Specialty

## 2015-01-30 ENCOUNTER — Ambulatory Visit (INDEPENDENT_AMBULATORY_CARE_PROVIDER_SITE_OTHER): Payer: Commercial Managed Care - HMO | Admitting: Unknown Physician Specialty

## 2015-01-30 ENCOUNTER — Telehealth: Payer: Self-pay | Admitting: Unknown Physician Specialty

## 2015-01-30 VITALS — BP 119/67 | HR 79 | Temp 98.7°F | Ht 66.5 in | Wt 198.8 lb

## 2015-01-30 DIAGNOSIS — J42 Unspecified chronic bronchitis: Secondary | ICD-10-CM | POA: Diagnosis not present

## 2015-01-30 DIAGNOSIS — I82629 Acute embolism and thrombosis of deep veins of unspecified upper extremity: Secondary | ICD-10-CM

## 2015-01-30 DIAGNOSIS — J209 Acute bronchitis, unspecified: Secondary | ICD-10-CM | POA: Diagnosis not present

## 2015-01-30 DIAGNOSIS — I82622 Acute embolism and thrombosis of deep veins of left upper extremity: Secondary | ICD-10-CM

## 2015-01-30 DIAGNOSIS — M797 Fibromyalgia: Secondary | ICD-10-CM | POA: Diagnosis not present

## 2015-01-30 LAB — COAGUCHEK XS/INR WAIVED
INR: 1.3 — AB (ref 0.9–1.1)
Prothrombin Time: 16 s

## 2015-01-30 MED ORDER — PREDNISONE 20 MG PO TABS: 20.0000 mg | ORAL_TABLET | Freq: Every day | ORAL | Status: DC

## 2015-01-30 MED ORDER — BUDESONIDE-FORMOTEROL FUMARATE 160-4.5 MCG/ACT IN AERO: 2.0000 | INHALATION_SPRAY | Freq: Two times a day (BID) | RESPIRATORY_TRACT | Status: AC

## 2015-01-30 MED ORDER — BENZONATATE 100 MG PO CAPS: 100.0000 mg | ORAL_CAPSULE | Freq: Two times a day (BID) | ORAL | Status: DC | PRN

## 2015-01-30 MED ORDER — CLINDAMYCIN HCL 300 MG PO CAPS
300.0000 mg | ORAL_CAPSULE | Freq: Three times a day (TID) | ORAL | Status: DC
Start: 2015-01-30 — End: 2015-02-26

## 2015-01-30 NOTE — Progress Notes (Signed)
BP 119/67 mmHg  Pulse 79  Temp(Src) 98.7 F (37.1 C)  Ht 5' 6.5" (1.689 m)  Wt 198 lb 12.8 oz (90.175 kg)  BMI 31.61 kg/m2  SpO2 95%   Subjective:    Patient ID: [name], female    DOB: [birth year], 60 y.o.   MRN: [id number]  CC: Coumadin management  She would like [name] to start managing her coumadin.  She checks it once a week  HPI: This patient is a 60 y.o. female who presents for coumadin management. The expected duration of coumadin treatment is lifelong The reason for anticoagulation is  thombophlebitis.  She is having some GI procedures.  On February 15th she is having GI procedures and needs to stop her coumadin 5 days before.    Present Coumadin dose: 7 mg 4 days/week and 6 mg 3 days/week Goal: 2.0-3.0 The patient does not have an active anticoagulation episode. Excessive bruising: no Nose bleeding: no Rectal bleeding: yes as she is bleeding into her colostomy bag.  Hematology referred her to GI Prolonged menstrual cycles: N/A Eating diet with consistent amounts of foods containing Vitamin K:no Any recent antibiotic use? no  Pain/Fibromyalgia Last visit increased Lyrica to 200 mg and doing better.    Cough This is a new (Out of Symbicort) problem. Episode onset: 3 days. The problem has been unchanged. The problem occurs constantly. The cough is productive of sputum. Associated symptoms include chills, shortness of breath and wheezing. Pertinent negatives include no chest pain, headaches, nasal congestion or sweats. The symptoms are aggravated by lying down. Risk factors for lung disease include smoking/tobacco exposure. She has tried steroid inhaler and a beta-agonist inhaler for the symptoms. The treatment provided mild relief.     Relevant past medical, surgical, family and social history reviewed and updated as indicated. Interim medical history since our last visit reviewed. Allergies and medications reviewed and updated.  ROS: Per HPI unless specifically  indicated above     Objective:    BP 119/67 mmHg  Pulse 79  Temp(Src) 98.7 F (37.1 C)  Ht 5' 6.5" (1.689 m)  Wt 198 lb 12.8 oz (90.175 kg)  BMI 31.61 kg/m2  SpO2 95%  Wt Readings from Last 3 Encounters:  01/30/15 198 lb 12.8 oz (90.175 kg)  01/29/15 197 lb 12 oz (89.7 kg)  01/23/15 197 lb (89.359 kg)     General: Well appearing, well nourished in no distress.  Normal mood and affect. Skin: No excessive bruising or rash  Physical Exam  Constitutional: She is oriented to person, place, and time and well-developed, well-nourished, and in no distress.  HENT:  Head: Normocephalic and atraumatic.  Pulmonary/Chest: Effort normal. No respiratory distress. She has decreased breath sounds in the right lower field. She has no wheezes. She has no rhonchi. She has no rales.  Musculoskeletal: Normal range of motion.  Neurological: She is alert and oriented to person, place, and time.  Skin: Skin is warm and dry.  Psychiatric: Mood, memory, affect and judgment normal.  Vitals reviewed.   Coumadin dose: 7 mg 2 days a week.  6 mg 5 days/week.    INR: 1.1 PT 13.5  Last CBC:  Lab Results  Component Value Date   WBC 5.5 01/23/2015   HGB 11.8* 01/23/2015   HCT 35.3 01/23/2015   MCV 93.3 01/23/2015   PLT 176 01/23/2015    Results for orders placed or performed in visit on 01/23/15  CoaguChek XS/INR Waived  Result Value Ref Range  INR 1.1 0.9 - 1.1   Prothrombin Time 13.5 sec       Assessment:     ICD-9-CM ICD-10-CM   1. Acute deep vein thrombosis (DVT) of other vein of left upper extremity (HCC)  I82.622 CoaguChek XS/INR Waived  2. Acute exacerbation of chronic bronchitis (HCC) 466.0 J20.9    491.9 J42    Prednisone 40 mg daily days.  Clindamyacin due to low risk interaction with Warfaren  3. Fibromyalgia 729.1 M79.7   4. Deep vein thrombosis (DVT) of upper extremity, unspecified chronicity, unspecified laterality, unspecified vein (HCC) 453.82 I82.629      Plan:    Discussed current plan face-to-face with patient. For coumadin dosing, elected to change dose to 7 mg 4 days/week and 6 for the others.   Will plan to recheck INR in 1 week.

## 2015-01-30 NOTE — Telephone Encounter (Signed)
Pt states she was supposed to get a 5 to 7 day supply of Prednisone called in but pharmacy has not received.  Please call pt to advise.

## 2015-01-30 NOTE — Assessment & Plan Note (Signed)
Increase Coumadin to 7 mg 7 days/week

## 2015-01-30 NOTE — Telephone Encounter (Signed)
I wrote it.  Thanks

## 2015-01-30 NOTE — Telephone Encounter (Signed)
Routing to provider,

## 2015-01-30 NOTE — Assessment & Plan Note (Signed)
Improved with Lyrica up to 200 mg.

## 2015-01-31 ENCOUNTER — Ambulatory Visit (INDEPENDENT_AMBULATORY_CARE_PROVIDER_SITE_OTHER): Payer: Commercial Managed Care - HMO | Admitting: Psychiatry

## 2015-01-31 ENCOUNTER — Encounter: Payer: Self-pay | Admitting: Psychiatry

## 2015-01-31 VITALS — BP 140/78 | HR 83 | Temp 97.9°F | Ht 66.5 in | Wt 199.4 lb

## 2015-01-31 DIAGNOSIS — F332 Major depressive disorder, recurrent severe without psychotic features: Secondary | ICD-10-CM | POA: Diagnosis not present

## 2015-01-31 MED ORDER — ARIPIPRAZOLE 2 MG PO TABS: 2.0000 mg | ORAL_TABLET | Freq: Every day | ORAL | Status: AC

## 2015-01-31 MED ORDER — ESCITALOPRAM OXALATE 20 MG PO TABS: 20.0000 mg | ORAL_TABLET | Freq: Every day | ORAL | Status: DC

## 2015-01-31 MED ORDER — DULOXETINE HCL 60 MG PO CPEP: 60.0000 mg | ORAL_CAPSULE | Freq: Every day | ORAL | Status: DC

## 2015-01-31 MED ORDER — ZOLPIDEM TARTRATE 10 MG PO TABS: 10.0000 mg | ORAL_TABLET | Freq: Every evening | ORAL | Status: AC | PRN

## 2015-01-31 MED ORDER — CLONAZEPAM 1 MG PO TABS: 1.0000 mg | ORAL_TABLET | Freq: Three times a day (TID) | ORAL | Status: AC | PRN
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
## 2015-01-31 NOTE — Telephone Encounter (Signed)
Called and left patient know rx was sent to pharmacy.

## 2015-02-05 ENCOUNTER — Telehealth: Payer: Self-pay | Admitting: *Deleted

## 2015-02-06 ENCOUNTER — Ambulatory Visit (INDEPENDENT_AMBULATORY_CARE_PROVIDER_SITE_OTHER): Payer: Commercial Managed Care - HMO | Admitting: Unknown Physician Specialty

## 2015-02-06 ENCOUNTER — Encounter: Payer: Self-pay | Admitting: Unknown Physician Specialty

## 2015-02-06 VITALS — BP 122/82 | HR 75 | Temp 98.2°F | Ht 66.0 in | Wt 191.0 lb

## 2015-02-06 DIAGNOSIS — I82629 Acute embolism and thrombosis of deep veins of unspecified upper extremity: Secondary | ICD-10-CM | POA: Diagnosis not present

## 2015-02-06 LAB — COAGUCHEK XS/INR WAIVED
INR: 1.5 — ABNORMAL HIGH (ref 0.9–1.1)
PROTHROMBIN TIME: 18 s

## 2015-02-06 MED ORDER — OXYCODONE-ACETAMINOPHEN 10-325 MG PO TABS
1.0000 | ORAL_TABLET | Freq: Four times a day (QID) | ORAL | Status: DC | PRN
Start: 2015-02-06 — End: 2015-02-22

## 2015-02-06 NOTE — Progress Notes (Signed)
   BP 122/82 mmHg  Pulse 75  Temp(Src) 98.2 F (36.8 C)  Ht '5\' 6"'$  (1.676 m)  Wt 191 lb (86.637 kg)  BMI 30.84 kg/m2  SpO2 94%   Subjective:    Patient ID: [name], female    DOB: 1955/02/12, 60 y.o.   MRN: [id number]  HPI: [name] is a 60 y.o. female  Chief Complaint  Patient presents with  . DVT   Present Coumadin dose: 7 mg 7 days a week which was an increase from last week Goal: 2.0-3.0 The patient does not have an active anticoagulation episode. Excessive bruising:no Nose bleeding:no Rectal bleeding:yes as she is bleeding into her colostomy bag. Hematology referred her to GI Prolonged menstrual cycles:N/A Eating diet with consistent amounts of foods containing Vitamin K:no Any recent antibiotic use?no Relevant past medical, surgical, family and social history reviewed and updated as indicated. Interim medical history since our last visit reviewed. Allergies and medications reviewed and updated.  Review of Systems  Per HPI unless specifically indicated above     Objective:    BP 122/82 mmHg  Pulse 75  Temp(Src) 98.2 F (36.8 C)  Ht '5\' 6"'$  (1.676 m)  Wt 191 lb (86.637 kg)  BMI 30.84 kg/m2  SpO2 94%  Wt Readings from Last 3 Encounters:  02/06/15 191 lb (86.637 kg)  01/31/15 199 lb 6.4 oz (90.447 kg)  01/30/15 198 lb 12.8 oz (90.175 kg)    Physical Exam  Constitutional: She is oriented to person, place, and time. She appears well-developed and well-nourished. No distress.  HENT:  Head: Normocephalic and atraumatic.  Eyes: Conjunctivae and lids are normal. Right eye exhibits no discharge. Left eye exhibits no discharge. No scleral icterus.  Cardiovascular: Normal rate.   Pulmonary/Chest: Effort normal.  Abdominal: Normal appearance. There is no splenomegaly or hepatomegaly.  Musculoskeletal: Normal range of motion.  Neurological: She is alert and oriented to person, place, and time.  Skin: Skin is intact. No rash noted. No pallor.   Psychiatric: She has a normal mood and affect. Her behavior is normal. Judgment and thought content normal.   INR 1.5 PT 18.0  Results for orders placed or performed in visit on 01/30/15  CoaguChek XS/INR Waived  Result Value Ref Range   INR 1.3 (H) 0.9 - 1.1   Prothrombin Time 16.0 sec      Assessment & Plan:   Problem List Items Addressed This Visit      Unprioritized   Deep vein thrombosis ([place]) - Primary   Relevant Orders   CoaguChek XS/INR Waived      Not to goal.  Increase to 8 mg 7 days/week.    Follow up plan: Return in about 1 week (around 02/13/2015).

## 2015-02-06 NOTE — Telephone Encounter (Signed)
Informed that prescription is ready to pick up  

## 2015-02-07 ENCOUNTER — Ambulatory Visit (INDEPENDENT_AMBULATORY_CARE_PROVIDER_SITE_OTHER): Payer: Commercial Managed Care - HMO | Admitting: Pulmonary Disease

## 2015-02-07 ENCOUNTER — Encounter: Payer: Self-pay | Admitting: Pulmonary Disease

## 2015-02-07 VITALS — BP 126/68 | HR 77 | Ht 66.5 in | Wt 191.6 lb

## 2015-02-07 DIAGNOSIS — J441 Chronic obstructive pulmonary disease with (acute) exacerbation: Secondary | ICD-10-CM | POA: Diagnosis not present

## 2015-02-07 DIAGNOSIS — Z720 Tobacco use: Secondary | ICD-10-CM | POA: Diagnosis not present

## 2015-02-07 DIAGNOSIS — R918 Other nonspecific abnormal finding of lung field: Secondary | ICD-10-CM

## 2015-02-07 DIAGNOSIS — F17200 Nicotine dependence, unspecified, uncomplicated: Secondary | ICD-10-CM

## 2015-02-07 MED ORDER — DOXYCYCLINE MONOHYDRATE 100 MG PO CAPS: 100.0000 mg | ORAL_CAPSULE | Freq: Two times a day (BID) | ORAL | Status: DC

## 2015-02-07 NOTE — Patient Instructions (Signed)
Continue Spiriva, Symbicort, albuterol as currently prescribed Take prednisone 40 mg daily for 7 days as prescribed by [name], NP Doxycycline 100 mg twice a day for 7 days  We discussed the absolute need for smoking cessation  Follow up in 4 weeks with chest Xray

## 2015-02-08 ENCOUNTER — Encounter: Payer: Self-pay | Admitting: Pulmonary Disease

## 2015-02-08 NOTE — Progress Notes (Signed)
PULMONARY CONSULT NOTE  Requesting MD/Service: [name], NP Date of initial consultation: 02/07/15 Reason for consultation: COPD with recent exacerbation and persistent dyspnea  PT PROFILE: 60 y.o. F smoker with hx of COPD initially evaluated in [place] [facility] 02/07/15 for persistent increase in dyspnea after recent AECOPD  HPI:  60 y.o. F with COPD who has class 2 dyspnea @ baseline referred for increased SOB/DOE of 3 wks duration. She also has cough productive of clear mucus, chest tightness, increased albuterol usage (3-4 times per day). She has been maintained on Spiriva and Symbicort for 3-4 yrs. For this current exacerbation of symptoms she has been treated with Ladona Ridgel which help her cough and has completed a course of clindamycin. She has been prescribed prednisone but had not filled that prescription at the time of this encounter. She conitnues to smoke 4-5 cigs/d and previously was a 2 PPD smoker. She has recently undergone surgery and chemotherapy for anal carcinoma. She had a portacath that was complicated by RUE DVT and has been removed. She is on warfarin for this. She underwent a CT chest on 10/22/14 at the time of her RUE DVT and this revealed no PE. There was an irregular, pleural based RLL opacity.  Past Medical History  Diagnosis Date  . Schizophrenia ([place])   . Asthma   . GERD (gastroesophageal reflux disease)   . Anxiety   . Depression   . Bipolar disorder ([place])   . COPD (chronic obstructive pulmonary disease) ([place])   . Occasional tremors     right hand  . PTSD (post-traumatic stress disorder)   . Shortness of breath dyspnea   . Fibromyalgia   . DVT (deep venous thrombosis) ([place]) 2011    RUE  . Thyroid nodule   . DDD (degenerative disc disease), lumbar   . Spinal stenosis   . Peripheral neuropathy ([place])   . Rotator cuff tear     right  . Pneumonia 2011  . Hypothyroidism     no meds currently  . Anemia     during pregnancy only  . Hypertension    Off meds x 15 years-well controlled now per pt  . Squamous cell cancer, anus (HCC)   . Severe obstructive sleep apnea 06/27/2014  . DVT of upper extremity (deep vein thrombosis) ([place]) 10/14/2014    Past Surgical History  Procedure Laterality Date  . Foot surgery Right   . Tubal ligation    . Eye surgery Bilateral   . Mouth surgery  2002  . Rectal biopsy N/A 05/08/2014    Procedure: BIOPSY RECTAL;  Surgeon: Marlyce Huge, MD;  Location: [facility];  Service: General;  Laterality: N/A;  . Evaluation under anesthesia with hemorrhoidectomy N/A 05/08/2014    Procedure: EXAM UNDER ANESTHESIA WITH HEMORRHOIDECTOMY;  Surgeon: Marlyce Huge, MD;  Location: [facility];  Service: General;  Laterality: N/A;  . Portacath placement N/A 05/20/2014    Procedure: INSERTION PORT-A-CATH;  Surgeon: Florene Glen, MD;  Location: [facility];  Service: General;  Laterality: N/A;  . Laparoscopic diverted colostomy N/A 05/26/2014    Procedure: LAPAROSCOPIC DIVERTED COLOSTOMY;  Surgeon: Marlyce Huge, MD;  Location: [facility];  Service: General;  Laterality: N/A;  . Peripheral vascular catheterization Left 10/16/2014    Procedure: Upper Extremity Venography with thrombectomy, port removal;  Surgeon: Algernon Huxley, MD;  Location: [facility];  Service: Cardiovascular;  Laterality: Left;  . Peripheral vascular catheterization  10/16/2014    Procedure: Upper Extremity Intervention;  Surgeon: Corene Cornea  Bunnie Domino, MD;  Location: [facility];  Service: Cardiovascular;;    MEDICATIONS: I have reviewed all medications and confirmed regimen as documented  Social History   Social History  . Marital Status: Legally Separated    Spouse Name: N/A  . Number of Children: N/A  . Years of Education: N/A   Occupational History  . Not on file.   Social History Main Topics  . Smoking status: Current Every Day Smoker -- 0.25 packs/day for 44 years    Types: Cigarettes    Start date: 10/04/1970  .  Smokeless tobacco: Never Used  . Alcohol Use: No     Comment: 1 drink every 2-3 times/year  . Drug Use: Yes    Special: Marijuana     Comment: Pt states she uses marijuana every once in a while to help with appetite  . Sexual Activity: Yes   Other Topics Concern  . Not on file   Social History Narrative    Family History  Problem Relation Age of Onset  . Cancer Maternal Aunt   . Cancer Paternal Uncle   . Diabetes Mother   . Thyroid disease Mother   . Kidney failure Mother   . Hypertension Mother   . Depression Mother   . Mental illness Son   . Aneurysm Maternal Grandmother   . Thyroid disease Maternal Grandmother   . Stroke Maternal Grandfather   . Hypertension Maternal Grandfather   . Diabetes Maternal Grandfather   . Heart disease Maternal Grandfather     MI  . Nephrolithiasis Daughter     ROS: No fever, myalgias/arthralgias, unexplained weight loss or weight gain No new focal weakness or sensory deficits No otalgia, hearing loss, visual changes, nasal and sinus symptoms, mouth and throat problems No neck pain or adenopathy No abdominal pain, N/V/D, diarrhea, change in bowel pattern No dysuria, change in urinary pattern No LE edema or calf tenderness   Filed Vitals:   02/07/15 1605  BP: 126/68  Pulse: 77  Height: 5' 6.5" (1.689 m)  Weight: 191 lb 9.6 oz (86.909 kg)  SpO2: 96%     EXAM:  Gen: Appears much older than documented age, No overt respiratory distress HEENT: NCAT, TMs and canals normal, sclera white, nares and nasal mucosa normal, oropharynx normal Neck: Supple without LAN, thyromegaly, JVD Thorax, Lungs: moderate kyphosis, breath sounds markedly diminished, percussion note normal throughout, diffuse scattered wheezes Cardiovascular: Normal rate, reg rhythm, no murmurs noted Abdomen: Soft, nontender, normal BS Ext: tobacco-stained nails. No clubbing, cyanosis, edema Neuro: CNs grossly intact, motor and sensory intact, DTRs symmetric Skin:  Limited exam, no lesions noted  DATA:   BMP Latest Ref Rng 12/21/2014 10/30/2014 10/22/2014  Glucose 65 - 99 mg/dL 75 77 95  BUN 6 - 20 mg/dL 10 5(L) 10  Creatinine 0.44 - 1.00 mg/dL 0.72 0.71 0.68  Sodium 135 - 145 mmol/L 136 138 138  Potassium 3.5 - 5.1 mmol/L 3.3(L) 3.7 3.3(L)  Chloride 101 - 111 mmol/L 102 109 105  CO2 22 - 32 mmol/L '26 26 29  '$ Calcium 8.9 - 10.3 mg/dL 8.8(L) 8.2(L) 8.6(L)    CBC Latest Ref Rng 01/23/2015 12/21/2014 10/30/2014  WBC 3.6 - 11.0 K/uL 5.5 5.2 5.4  Hemoglobin 12.0 - 16.0 g/dL 11.8(L) 12.8 12.5  Hematocrit 35.0 - 47.0 % 35.3 38.2 37.8  Platelets 150 - 440 K/uL 176 136(L) 181    CXR:  No recent  CT chest 10/22/14: irregular, pleural based RLL opacity  IMPRESSION:  ICD-9-CM ICD-10-CM   1. COPD exacerbation ([place]) 491.21 J44.1   2. Smoker 305.1 Z72.0 DG Chest 2 View  3. Opacity of lung on imaging study 793.19 R91.8 DG Chest 2 View     PLAN:  1) Counseled at length re: need for total abstinence from cigarettes. I emphasized that there is no medical therapy or regimen that can offset the ravages of smoking 2) Cont Spiriva and Symbicort. Continue PRN albuterol 3) Encouraged that she fill prednisone and use as prescribed 4) Doxycycline 100 mg BID X 7 days 5) ROV 4 wks with CXR 6) will need PFTs once she reaches her baseline   Wilhelmina Mcardle, MD [facility]

## 2015-02-09 ENCOUNTER — Other Ambulatory Visit: Payer: Self-pay | Admitting: Unknown Physician Specialty

## 2015-02-13 ENCOUNTER — Encounter: Payer: Self-pay | Admitting: Hematology and Oncology

## 2015-02-13 ENCOUNTER — Ambulatory Visit: Payer: Self-pay | Admitting: Unknown Physician Specialty

## 2015-02-13 NOTE — Progress Notes (Signed)
[facility] day:  01/23/2015  Chief Complaint: [name] is a 60 y.o. 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  HPI:  The patient was last seen in the medical oncology clinic on 12/28/2014.  At that time, her INR was sub-therapeutic (1.21).  Her Coumadin was increased to 6 mg 4 days a week and 5 mg 3 days a week.  She had a follow-up appointment with Dr. Phoebe Perch of surgery.  She was referred to the pain clinic for management of her chronic pain.  During the interim, she notes her pain level is a 3-4. Currently she has no appointment with the pain clinic.  She notes that last week her colostomy had to have blood. Blood was described as a dark. She had some mid abdominal pain when she bent over.  She saw Dr. Burt Knack, surgeon, on 01/02/2015.  Her anal area was felt to be completely healed.  Closure of her ostomy was not recommended for at least 6 months.  She has a follow-up appointment in 06/2015.  She notes a cough and "trouble breathing". Her cough is clear to milky in consistency. She has some bad reflux for which she is taking ranitidine.  Regarding her Coumadin, INR was 1.2 on 01/09/2015.  We had tried multiple times to call her to adjust her Coumadin. She was having trouble with her phone and sometimes had the volume turned down.  She was reached on 01/09/2015.  She had been taking Coumadin 6 mg on Mon, Wed, Fri and Sunday and 5 mg on Tuesday, Thursday, Saturday (total weekly dose : 39 mg) for 2 weeks.  Her new dose was Coumadin 6 mg 5 days a week and 7 mg 2 days a week (Wednesdays and Sundays). Total weekly dose was 44 mg.  She stated that Dr. [name] would be taking over her Coumadin on 01/15/2015.  She denies any bruising or bleeding.  Past Medical History  Diagnosis Date  . Schizophrenia ([place])   . Asthma   . GERD  (gastroesophageal reflux disease)   . Anxiety   . Depression   . Bipolar disorder ([place])   . COPD (chronic obstructive pulmonary disease) ([place])   . Occasional tremors     right hand  . PTSD (post-traumatic stress disorder)   . Shortness of breath dyspnea   . Fibromyalgia   . DVT (deep venous thrombosis) ([place]) 2011    RUE  . Thyroid nodule   . DDD (degenerative disc disease), lumbar   . Spinal stenosis   . Peripheral neuropathy ([place])   . Rotator cuff tear     right  . Pneumonia 2011  . Hypothyroidism     no meds currently  . Anemia     during pregnancy only  . Hypertension     Off meds x 15 years-well controlled now per pt  . Squamous cell cancer, anus (HCC)   . Severe obstructive sleep apnea 06/27/2014  . DVT of upper extremity (deep vein thrombosis) ([place]) 10/14/2014    Past Surgical History  Procedure Laterality Date  . Foot surgery Right   . Tubal ligation    . Eye surgery Bilateral   . Mouth surgery  2002  . Rectal biopsy N/A 05/08/2014    Procedure: BIOPSY RECTAL;  Surgeon: Marlyce Huge, MD;  Location: [facility];  Service: General;  Laterality: N/A;  .  Evaluation under anesthesia with hemorrhoidectomy N/A 05/08/2014    Procedure: EXAM UNDER ANESTHESIA WITH HEMORRHOIDECTOMY;  Surgeon: Marlyce Huge, MD;  Location: [facility];  Service: General;  Laterality: N/A;  . Portacath placement N/A 05/20/2014    Procedure: INSERTION PORT-A-CATH;  Surgeon: Florene Glen, MD;  Location: [facility];  Service: General;  Laterality: N/A;  . Laparoscopic diverted colostomy N/A 05/26/2014    Procedure: LAPAROSCOPIC DIVERTED COLOSTOMY;  Surgeon: Marlyce Huge, MD;  Location: [facility];  Service: General;  Laterality: N/A;  . Peripheral vascular catheterization Left 10/16/2014    Procedure: Upper Extremity Venography with thrombectomy, port removal;  Surgeon: Algernon Huxley, MD;  Location: [facility];  Service: Cardiovascular;  Laterality: Left;  . Peripheral  vascular catheterization  10/16/2014    Procedure: Upper Extremity Intervention;  Surgeon: Algernon Huxley, MD;  Location: [facility];  Service: Cardiovascular;;    Family History  Problem Relation Age of Onset  . Cancer Maternal Aunt   . Cancer Paternal Uncle   . Diabetes Mother   . Thyroid disease Mother   . Kidney failure Mother   . Hypertension Mother   . Depression Mother   . Mental illness Son   . Aneurysm Maternal Grandmother   . Thyroid disease Maternal Grandmother   . Stroke Maternal Grandfather   . Hypertension Maternal Grandfather   . Diabetes Maternal Grandfather   . Heart disease Maternal Grandfather     MI  . Nephrolithiasis Daughter     Social History:  reports that she has been smoking Cigarettes.  She started smoking about 44 years ago. She has a 11 pack-year smoking history. She has never used smokeless tobacco. She reports that she uses illicit drugs (Marijuana). She reports that she does not drink alcohol.  The patient is alone today.  Allergies:  Allergies  Allergen Reactions  . Sulfa Antibiotics Hives    Current Medications: Current Outpatient Prescriptions  Medication Sig Dispense Refill  . albuterol (PROVENTIL HFA;VENTOLIN HFA) 108 (90 BASE) MCG/ACT inhaler Inhale 1 puff into the lungs every 6 (six) hours as needed for wheezing or shortness of breath.    . Calcium Carb-Cholecalciferol (CALCIUM 600+D) 600-800 MG-UNIT TABS Take 1 tablet by mouth 2 (two) times daily.     Marland Kitchen docusate sodium (COLACE) 100 MG capsule Take 100 mg by mouth daily.    Marland Kitchen oxybutynin (DITROPAN) 5 MG tablet Take 5 mg by mouth 2 (two) times daily.    Marland Kitchen tiotropium (SPIRIVA) 18 MCG inhalation capsule Place 18 mcg into inhaler and inhale daily.    Marland Kitchen warfarin (COUMADIN) 5 MG tablet Take 1 tablet (5 mg total) by mouth daily. 30 tablet 0  . ARIPiprazole (ABILIFY) 2 MG tablet Take 1 tablet (2 mg total) by mouth daily. 30 tablet 4  . baclofen (LIORESAL) 10 MG tablet Take 1 tablet (10 mg  total) by mouth 3 (three) times daily. 90 each 6  . budesonide-formoterol (SYMBICORT) 160-4.5 MCG/ACT inhaler Inhale 2 puffs into the lungs 2 (two) times daily. 1 Inhaler 12  . Cholecalciferol (VITAMIN D-1000 MAX ST) 1000 units tablet Take by mouth.    . clindamycin (CLEOCIN) 300 MG capsule Take 1 capsule (300 mg total) by mouth 3 (three) times daily. 21 capsule 0  . clonazePAM (KLONOPIN) 1 MG tablet Take 1 tablet (1 mg total) by mouth 3 (three) times daily as needed for anxiety. 90 tablet 4  . dicyclomine (BENTYL) 10 MG capsule Take 10 mg by mouth 4 (four)  times daily as needed.    . doxycycline (MONODOX) 100 MG capsule Take 1 capsule (100 mg total) by mouth 2 (two) times daily. 14 capsule 0  . DULoxetine (CYMBALTA) 60 MG capsule Take 1 capsule (60 mg total) by mouth daily. 30 capsule 4  . escitalopram (LEXAPRO) 20 MG tablet Take 1 tablet (20 mg total) by mouth daily. 30 tablet 4  . etodolac (LODINE) 500 MG tablet Take by mouth. Reported on 01/31/2015    . mometasone (ELOCON) 0.1 % cream Apply topically. Reported on 01/31/2015    . oxyCODONE-acetaminophen (PERCOCET) 10-325 MG tablet Take 1 tablet by mouth every 6 (six) hours as needed for pain. 60 tablet 0  . pantoprazole (PROTONIX) 40 MG tablet Take by mouth.    . pregabalin (LYRICA) 200 MG capsule Take 1 capsule (200 mg total) by mouth 2 (two) times daily. 90 capsule 6  . warfarin (COUMADIN) 1 MG tablet Take 1 tablet (1 mg total) by mouth daily. 90 tablet 0  . zolpidem (AMBIEN) 10 MG tablet Take 1 tablet (10 mg total) by mouth at bedtime as needed for sleep. 30 tablet 4  . [DISCONTINUED] apixaban (ELIQUIS) 5 MG TABS tablet Take 1 tablet (5 mg total) by mouth 2 (two) times daily. 60 tablet 2   No current facility-administered medications for this visit.   Facility-Administered Medications Ordered in Other Visits  Medication Dose Route Frequency Provider Last Rate Last Dose  . heparin lock flush 100 unit/mL  500 Units Intravenous Once Lequita Asal, MD      . sodium chloride 0.9 % injection 10 mL  10 mL Intravenous PRN Lequita Asal, MD        Review of Systems:  GENERAL:  Feels "ok'.  No fevers or sweats.  Weight up 2 pounds. PERFORMANCE STATUS (ECOG):  2 HEENT:  No visual changes, runny nose, mouth sores or sore throat. Lungs: No shortness of breath or cough.  No hemoptysis. Cardiac:  No chest pain, palpitations, orthopnea, or PND. GI:  Colostomy.  Episode of blood in ostomy output.  No nausea, vomiting, constipation, melena or hematochezia. GU:  No urgency, frequency, dysuria, or hematuria. Musculoskeletal:  No back pain.  No joint pain.  No muscle tenderness. Extremities:  No pain or swelling. Skin:  Per-rectal breakdown resolved. Neuro:  No headache, numbness or weakness, balance or coordination issues. Endocrine:  No diabetes, thyroid issues, hot flashes or night sweats. Psych:  No mood changes or depression.  Anxiety improved since start of treatment. Pain:  Chronic anal pain.  Still using oxycodone prn. Review of systems:  All other systems reviewed and found to be negative.   Physical Exam: Blood pressure 147/71, pulse 89, temperature 99.5 F (37.5 C), resp. rate 18, height '5\' 7"'$  (1.702 m), weight 196 lb 10.4 oz (89.2 kg).  GENERAL:  Chronically ill appearing woman lying on her side in the exam room in no acute distress.  MENTAL STATUS:  Alert and oriented to person, place and time. HEAD:  Long blonde/graying hair.  Normocephalic, atraumatic, face symmetric, no Cushingoid features. EYES:  Blue eyes.  Pupils equal round and reactive to light and accomodation.  No conjunctivitis or scleral icterus. RESPIRATORY:  Clear to auscultation without rales, wheezes or rhonchi. CARDIOVASCULAR:  Regular rate and rhythm without murmur, rub or gallop. ABDOMEN:  Ostomy.  Stool liquid brown (no obvious blood).  Soft, non-tender, with active bowel sounds, and no hepatosplenomegaly.  No masses. RECTUM:  Perirectal area with  radiation changes.  Pink healthy tissue.  No breakdown. LYMPH NODES:  No palpable cervical, supraclavicular, axillary or inguinal adenopathy. SKIN:  No other rashes, ulcers or lesions. EXTREMITIES: Subtle left upper extremity edema.  No skin discoloration or tenderness.  No palpable cords. NEUROLOGICAL: Unremarkable. PSYCH:  Appropriate.   Office Visit on 01/23/2015  Component Date Value Ref Range Status  . INR 01/23/2015 1.1  0.9 - 1.1 Final  . Prothrombin Time 01/23/2015 13.5   Final   Comment: Differences in reagents, instruments, and pre-analytical variables can affect prothrombin time results.  These factors should be considered when comparing different prothrombin time test methods. Please Note: This test should not be used to monitor persons on heparin therapy.   Appointment on 01/23/2015  Component Date Value Ref Range Status  . WBC 01/23/2015 5.5  3.6 - 11.0 K/uL Final  . RBC 01/23/2015 3.78* 3.80 - 5.20 MIL/uL Final  . Hemoglobin 01/23/2015 11.8* 12.0 - 16.0 g/dL Final  . HCT 01/23/2015 35.3  35.0 - 47.0 % Final  . MCV 01/23/2015 93.3  80.0 - 100.0 fL Final  . MCH 01/23/2015 31.3  26.0 - 34.0 pg Final  . MCHC 01/23/2015 33.6  32.0 - 36.0 g/dL Final  . RDW 01/23/2015 15.6* 11.5 - 14.5 % Final  . Platelets 01/23/2015 176  150 - 440 K/uL Final  . Neutrophils Relative % 01/23/2015 72   Final  . Neutro Abs 01/23/2015 4.0  1.4 - 6.5 K/uL Final  . Lymphocytes Relative 01/23/2015 14   Final  . Lymphs Abs 01/23/2015 0.7* 1.0 - 3.6 K/uL Final  . Monocytes Relative 01/23/2015 9   Final  . Monocytes Absolute 01/23/2015 0.5  0.2 - 0.9 K/uL Final  . Eosinophils Relative 01/23/2015 4   Final  . Eosinophils Absolute 01/23/2015 0.2  0 - 0.7 K/uL Final  . Basophils Relative 01/23/2015 1   Final  . Basophils Absolute 01/23/2015 0.0  0 - 0.1 K/uL Final  . Prothrombin Time 01/23/2015 14.6  11.4 - 15.0 seconds Final  . INR 01/23/2015 1.12   Final  . Amylase 01/23/2015 43  28 - 100 U/L  Final  . Lipase 01/23/2015 28  11 - 51 U/L Final  . Total Protein 01/23/2015 6.8  6.5 - 8.1 g/dL Final  . Albumin 01/23/2015 3.1* 3.5 - 5.0 g/dL Final  . AST 01/23/2015 26  15 - 41 U/L Final  . ALT 01/23/2015 11* 14 - 54 U/L Final  . Alkaline Phosphatase 01/23/2015 77  38 - 126 U/L Final  . Total Bilirubin 01/23/2015 0.7  0.3 - 1.2 mg/dL Final  . Bilirubin, Direct 01/23/2015 0.1  0.1 - 0.5 mg/dL Final  . Indirect Bilirubin 01/23/2015 0.6  0.3 - 0.9 mg/dL Final    Assessment:  [name] is a 60 y.o. 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  Chest, abdomen, and pelvic CT scan on 05/19/2014 revealed no evidence of metastatic disease.  There was a 4 mm RUL pulmonary nodule.  There were borderline (13 mm) enlarged iliac nodes.  There was a 4.7 x 4.1 x 6.0 cm lower rectal and anal mass consistent with anal cancer.  She underwent diverting colostomy on 05/26/2014.    She received concurrent chemotherapy (5FU and mitomycin-C) and radiation (06/29/2014 - 07/31/2014).  She missed 3 days of radiation (07/04-07/06/2014).  She has been off radiation since  08/23/2014 secondary to perirectal breakdown.  She received hyperbaric oxygen.  She was seen by Dr. Phoebe Perch on 01/02/2015.  Closure of her ostomy was not recommended for at least 6 months.    She has macrocytic RBC indices.  Work-up on 09/28/2014 revealed a B12 deficiency (169) and folate deficiency (5.2).  TSH was normal.  She receives B12 (last 12/21/2014).  She is supposed to take folic acid (initially not taking secondary to costs).  She developed a left upper extremity DVT on 10/14/2014.  She underwent thrombolysis, thrombectomy, and port removal on 10/16/2014.  She is on Lovenox and Coumadin (followed by vascular surgery).  INR remains sub-therapeutic (1.12) on Coumadin  6 mg 5 days  a week and 7 mg 2 days a week (Wednesdays and Sundays). Total weekly dose is 44 mg.  Symptomatically, she notes some interval abdominal pain and blood in her ostomy.  She denies any bruising or bleeding.  She has chronic rectal pain.  She has not gotten an appointment with the pain clinic.  Plan: 1.  Labs today:  CBC with diff, LFTs, amylase, lipase, PT/INR. 2.  Increase Coumadin to 7 mg 4 days a week and 6 mg 3 days a week (total weekly dose: 46 mg). 3.  Continue B12 monthly.  Continue folic acid daily. 4.  Follow-up with Dr. Baruch Gouty, radiation oncology. 5.  Gastroenterology referral:  anal cancer; h/o blood in ostomy. 6.  RTC in 1 month for  MD assesment.   Lequita Asal, MD  01/23/2015

## 2015-02-19 NOTE — Progress Notes (Unsigned)
PSN left patient a voice mail message letting her know that March 1st [facility] will be picking up the hospital bed provided to her.  [employer] has been paying the monthly rental for the bed.  Patient is now ambulatory.

## 2015-02-20 ENCOUNTER — Telehealth: Payer: Self-pay | Admitting: *Deleted

## 2015-02-20 NOTE — Telephone Encounter (Signed)
Needs refill on 2/16

## 2015-02-22 ENCOUNTER — Encounter: Admission: RE | Payer: Self-pay | Source: Ambulatory Visit

## 2015-02-22 ENCOUNTER — Ambulatory Visit
Admission: RE | Admit: 2015-02-22 | Payer: Commercial Managed Care - HMO | Source: Ambulatory Visit | Admitting: Gastroenterology

## 2015-02-22 SURGERY — COLONOSCOPY WITH PROPOFOL
Anesthesia: General

## 2015-02-22 MED ORDER — OXYCODONE-ACETAMINOPHEN 10-325 MG PO TABS: 1.0000 | ORAL_TABLET | Freq: Four times a day (QID) | ORAL | Status: DC | PRN

## 2015-02-22 NOTE — Telephone Encounter (Signed)
I called [facility] and spoke with Angie who explained that when we enter referral orders in [facility] for pain clinic, we need to choose [facility] and do not need to choose a provider. They do not get the referral if this is not done. She will reach out to [name] for an appt now that she is aware of the referral, She apologized for it not having been done yet

## 2015-02-26 ENCOUNTER — Encounter: Payer: Self-pay | Admitting: Emergency Medicine

## 2015-02-26 ENCOUNTER — Emergency Department: Payer: Commercial Managed Care - HMO

## 2015-02-26 ENCOUNTER — Inpatient Hospital Stay
Admission: EM | Admit: 2015-02-26 | Discharge: 2015-03-03 | DRG: 177 | Disposition: A | Discharge status: Home / Self Care | Payer: Commercial Managed Care - HMO | Attending: Specialist | Admitting: Specialist

## 2015-02-26 ENCOUNTER — Other Ambulatory Visit: Payer: Self-pay | Admitting: Unknown Physician Specialty

## 2015-02-26 DIAGNOSIS — J440 Chronic obstructive pulmonary disease with acute lower respiratory infection: Secondary | ICD-10-CM | POA: Diagnosis present

## 2015-02-26 DIAGNOSIS — J90 Pleural effusion, not elsewhere classified: Secondary | ICD-10-CM | POA: Diagnosis present

## 2015-02-26 DIAGNOSIS — I82402 Acute embolism and thrombosis of unspecified deep veins of left lower extremity: Secondary | ICD-10-CM | POA: Diagnosis not present

## 2015-02-26 DIAGNOSIS — M797 Fibromyalgia: Secondary | ICD-10-CM | POA: Diagnosis present

## 2015-02-26 DIAGNOSIS — J209 Acute bronchitis, unspecified: Secondary | ICD-10-CM | POA: Diagnosis present

## 2015-02-26 DIAGNOSIS — Z79899 Other long term (current) drug therapy: Secondary | ICD-10-CM | POA: Diagnosis not present

## 2015-02-26 DIAGNOSIS — Z85048 Personal history of other malignant neoplasm of rectum, rectosigmoid junction, and anus: Secondary | ICD-10-CM

## 2015-02-26 DIAGNOSIS — K567 Ileus, unspecified: Secondary | ICD-10-CM | POA: Diagnosis present

## 2015-02-26 DIAGNOSIS — G894 Chronic pain syndrome: Secondary | ICD-10-CM | POA: Diagnosis present

## 2015-02-26 DIAGNOSIS — J9600 Acute respiratory failure, unspecified whether with hypoxia or hypercapnia: Secondary | ICD-10-CM | POA: Diagnosis present

## 2015-02-26 DIAGNOSIS — K435 Parastomal hernia without obstruction or  gangrene: Secondary | ICD-10-CM | POA: Diagnosis present

## 2015-02-26 DIAGNOSIS — J449 Chronic obstructive pulmonary disease, unspecified: Secondary | ICD-10-CM | POA: Diagnosis present

## 2015-02-26 DIAGNOSIS — K5669 Other intestinal obstruction: Secondary | ICD-10-CM | POA: Diagnosis not present

## 2015-02-26 DIAGNOSIS — G4733 Obstructive sleep apnea (adult) (pediatric): Secondary | ICD-10-CM | POA: Diagnosis present

## 2015-02-26 DIAGNOSIS — K219 Gastro-esophageal reflux disease without esophagitis: Secondary | ICD-10-CM | POA: Diagnosis present

## 2015-02-26 DIAGNOSIS — R1013 Epigastric pain: Secondary | ICD-10-CM

## 2015-02-26 DIAGNOSIS — F4310 Post-traumatic stress disorder, unspecified: Secondary | ICD-10-CM | POA: Diagnosis present

## 2015-02-26 DIAGNOSIS — Z833 Family history of diabetes mellitus: Secondary | ICD-10-CM

## 2015-02-26 DIAGNOSIS — R188 Other ascites: Secondary | ICD-10-CM | POA: Diagnosis present

## 2015-02-26 DIAGNOSIS — D649 Anemia, unspecified: Secondary | ICD-10-CM | POA: Diagnosis present

## 2015-02-26 DIAGNOSIS — K922 Gastrointestinal hemorrhage, unspecified: Secondary | ICD-10-CM

## 2015-02-26 DIAGNOSIS — Z933 Colostomy status: Secondary | ICD-10-CM | POA: Diagnosis not present

## 2015-02-26 DIAGNOSIS — K5660 Unspecified intestinal obstruction: Secondary | ICD-10-CM | POA: Diagnosis not present

## 2015-02-26 DIAGNOSIS — K5900 Constipation, unspecified: Secondary | ICD-10-CM | POA: Diagnosis present

## 2015-02-26 DIAGNOSIS — R739 Hyperglycemia, unspecified: Secondary | ICD-10-CM | POA: Diagnosis present

## 2015-02-26 DIAGNOSIS — F319 Bipolar disorder, unspecified: Secondary | ICD-10-CM | POA: Diagnosis present

## 2015-02-26 DIAGNOSIS — J189 Pneumonia, unspecified organism: Secondary | ICD-10-CM | POA: Diagnosis not present

## 2015-02-26 DIAGNOSIS — Z7901 Long term (current) use of anticoagulants: Secondary | ICD-10-CM

## 2015-02-26 DIAGNOSIS — Z8249 Family history of ischemic heart disease and other diseases of the circulatory system: Secondary | ICD-10-CM

## 2015-02-26 DIAGNOSIS — Z86718 Personal history of other venous thrombosis and embolism: Secondary | ICD-10-CM

## 2015-02-26 DIAGNOSIS — J441 Chronic obstructive pulmonary disease with (acute) exacerbation: Secondary | ICD-10-CM | POA: Diagnosis present

## 2015-02-26 DIAGNOSIS — J690 Pneumonitis due to inhalation of food and vomit: Secondary | ICD-10-CM | POA: Diagnosis not present

## 2015-02-26 DIAGNOSIS — J439 Emphysema, unspecified: Secondary | ICD-10-CM | POA: Diagnosis not present

## 2015-02-26 DIAGNOSIS — I82729 Chronic embolism and thrombosis of deep veins of unspecified upper extremity: Secondary | ICD-10-CM

## 2015-02-26 DIAGNOSIS — G8929 Other chronic pain: Secondary | ICD-10-CM | POA: Diagnosis not present

## 2015-02-26 DIAGNOSIS — F17210 Nicotine dependence, cigarettes, uncomplicated: Secondary | ICD-10-CM | POA: Diagnosis present

## 2015-02-26 DIAGNOSIS — K56609 Unspecified intestinal obstruction, unspecified as to partial versus complete obstruction: Secondary | ICD-10-CM

## 2015-02-26 DIAGNOSIS — Z841 Family history of disorders of kidney and ureter: Secondary | ICD-10-CM

## 2015-02-26 DIAGNOSIS — J9620 Acute and chronic respiratory failure, unspecified whether with hypoxia or hypercapnia: Secondary | ICD-10-CM | POA: Diagnosis not present

## 2015-02-26 DIAGNOSIS — J9811 Atelectasis: Secondary | ICD-10-CM | POA: Diagnosis present

## 2015-02-26 LAB — BASIC METABOLIC PANEL
ANION GAP: 6 (ref 5–15)
BUN: 11 mg/dL (ref 6–20)
CHLORIDE: 105 mmol/L (ref 101–111)
CO2: 28 mmol/L (ref 22–32)
Calcium: 8.5 mg/dL — ABNORMAL LOW (ref 8.9–10.3)
Creatinine, Ser: 0.8 mg/dL (ref 0.44–1.00)
GFR calc non Af Amer: >60 mL/min (ref >60)
Glucose, Bld: 102 mg/dL — ABNORMAL HIGH (ref 65–99)
Potassium: 3.6 mmol/L (ref 3.5–5.1)
Sodium: 139 mmol/L (ref 135–145)

## 2015-02-26 LAB — CBC
HCT: 32.4 % — ABNORMAL LOW (ref 35.0–47.0)
Hemoglobin: 10.9 g/dL — ABNORMAL LOW (ref 12.0–16.0)
MCH: 31.2 pg (ref 26.0–34.0)
MCHC: 33.6 g/dL (ref 32.0–36.0)
MCV: 92.7 fL (ref 80.0–100.0)
PLATELETS: 200 10*3/uL (ref 150–440)
RBC: 3.49 MIL/uL — ABNORMAL LOW (ref 3.80–5.20)
RDW: 16 % — AB (ref 11.5–14.5)
WBC: 7 10*3/uL (ref 3.6–11.0)

## 2015-02-26 LAB — PROTIME-INR
INR: 1.13
Prothrombin Time: 14.7 seconds (ref 11.4–15.0)

## 2015-02-26 MED ORDER — WARFARIN SODIUM 2.5 MG PO TABS: 5.0000 mg | ORAL_TABLET | Freq: Every day | ORAL | Status: DC

## 2015-02-26 MED ORDER — METHYLPREDNISOLONE SODIUM SUCC 125 MG IJ SOLR
60.0000 mg | Freq: Three times a day (TID) | INTRAMUSCULAR | Status: DC
  Administered 2015-02-26 – 2015-02-28 (×6): 60 mg via INTRAVENOUS
  Filled 2015-02-26 (×6): qty 2

## 2015-02-26 MED ORDER — WARFARIN - PHARMACIST DOSING INPATIENT
Freq: Every day | Status: DC
  Administered 2015-02-27 – 2015-02-28 (×2)

## 2015-02-26 MED ORDER — WARFARIN SODIUM 4 MG PO TABS
8.0000 mg | ORAL_TABLET | Freq: Every day | ORAL | Status: DC
  Administered 2015-02-26: 8 mg via ORAL
  Filled 2015-02-26: qty 2

## 2015-02-26 MED ORDER — ARIPIPRAZOLE 2 MG PO TABS
2.0000 mg | ORAL_TABLET | Freq: Every day | ORAL | Status: DC
  Administered 2015-02-26 – 2015-03-03 (×6): 2 mg via ORAL
  Filled 2015-02-26 (×6): qty 1

## 2015-02-26 MED ORDER — WARFARIN SODIUM 2 MG PO TABS: 1.0000 mg | ORAL_TABLET | Freq: Every day | ORAL | Status: DC

## 2015-02-26 MED ORDER — ACETAMINOPHEN 325 MG PO TABS: 650.0000 mg | ORAL_TABLET | Freq: Four times a day (QID) | ORAL | Status: DC | PRN

## 2015-02-26 MED ORDER — DULOXETINE HCL 60 MG PO CPEP
60.0000 mg | ORAL_CAPSULE | Freq: Every day | ORAL | Status: DC
  Administered 2015-02-26 – 2015-03-02 (×5): 60 mg via ORAL
  Filled 2015-02-26 (×6): qty 1

## 2015-02-26 MED ORDER — ZOLPIDEM TARTRATE 5 MG PO TABS
5.0000 mg | ORAL_TABLET | Freq: Every evening | ORAL | Status: DC | PRN
  Administered 2015-02-26 – 2015-02-27 (×2): 5 mg via ORAL
  Filled 2015-02-26 (×3): qty 1

## 2015-02-26 MED ORDER — METHYLPREDNISOLONE SODIUM SUCC 125 MG IJ SOLR
125.0000 mg | Freq: Once | INTRAMUSCULAR | Status: AC
  Administered 2015-02-26: 125 mg via INTRAVENOUS
  Filled 2015-02-26: qty 2

## 2015-02-26 MED ORDER — BUDESONIDE-FORMOTEROL FUMARATE 160-4.5 MCG/ACT IN AERO
2.0000 | INHALATION_SPRAY | Freq: Two times a day (BID) | RESPIRATORY_TRACT | Status: DC
  Administered 2015-02-26 – 2015-03-03 (×10): 2 via RESPIRATORY_TRACT
  Filled 2015-02-26: qty 6

## 2015-02-26 MED ORDER — SENNOSIDES-DOCUSATE SODIUM 8.6-50 MG PO TABS
1.0000 | ORAL_TABLET | Freq: Every evening | ORAL | Status: DC | PRN
  Administered 2015-03-02: 1 via ORAL
  Filled 2015-02-26 (×2): qty 1

## 2015-02-26 MED ORDER — BACLOFEN 10 MG PO TABS
10.0000 mg | ORAL_TABLET | Freq: Three times a day (TID) | ORAL | Status: DC
  Administered 2015-02-26 – 2015-03-03 (×14): 10 mg via ORAL
  Filled 2015-02-26 (×14): qty 1

## 2015-02-26 MED ORDER — CALCIUM CARBONATE-VITAMIN D 500-200 MG-UNIT PO TABS
1.0000 | ORAL_TABLET | Freq: Two times a day (BID) | ORAL | Status: DC
  Administered 2015-02-26 – 2015-03-03 (×10): 1 via ORAL
  Filled 2015-02-26 (×10): qty 1

## 2015-02-26 MED ORDER — ONDANSETRON HCL 4 MG/2ML IJ SOLN: 4.0000 mg | Freq: Four times a day (QID) | INTRAMUSCULAR | Status: DC | PRN

## 2015-02-26 MED ORDER — ACETAMINOPHEN 650 MG RE SUPP: 650.0000 mg | Freq: Four times a day (QID) | RECTAL | Status: DC | PRN

## 2015-02-26 MED ORDER — ALBUTEROL SULFATE (2.5 MG/3ML) 0.083% IN NEBU
5.0000 mg | INHALATION_SOLUTION | Freq: Once | RESPIRATORY_TRACT | Status: AC
  Administered 2015-02-26: 5 mg via RESPIRATORY_TRACT
  Filled 2015-02-26: qty 6

## 2015-02-26 MED ORDER — ALBUTEROL SULFATE HFA 108 (90 BASE) MCG/ACT IN AERS: 1.0000 | INHALATION_SPRAY | Freq: Four times a day (QID) | RESPIRATORY_TRACT | Status: DC | PRN

## 2015-02-26 MED ORDER — HYDROCODONE-ACETAMINOPHEN 5-325 MG PO TABS
1.0000 | ORAL_TABLET | ORAL | Status: DC | PRN
  Administered 2015-02-26: 2 via ORAL
  Filled 2015-02-26 (×2): qty 2

## 2015-02-26 MED ORDER — ESCITALOPRAM OXALATE 10 MG PO TABS: 20.0000 mg | ORAL_TABLET | Freq: Every day | ORAL | Status: DC

## 2015-02-26 MED ORDER — ALBUTEROL SULFATE (2.5 MG/3ML) 0.083% IN NEBU
2.5000 mg | INHALATION_SOLUTION | Freq: Four times a day (QID) | RESPIRATORY_TRACT | Status: DC | PRN
Start: 2015-02-26 — End: 2015-02-28

## 2015-02-26 MED ORDER — DOCUSATE SODIUM 100 MG PO CAPS
100.0000 mg | ORAL_CAPSULE | Freq: Every day | ORAL | Status: DC
  Administered 2015-02-26 – 2015-03-03 (×6): 100 mg via ORAL
  Filled 2015-02-26 (×6): qty 1

## 2015-02-26 MED ORDER — IPRATROPIUM-ALBUTEROL 0.5-2.5 (3) MG/3ML IN SOLN
RESPIRATORY_TRACT | Status: AC
  Administered 2015-02-26: 3 mL via RESPIRATORY_TRACT
  Filled 2015-02-26: qty 3

## 2015-02-26 MED ORDER — IPRATROPIUM-ALBUTEROL 0.5-2.5 (3) MG/3ML IN SOLN
3.0000 mL | RESPIRATORY_TRACT | Status: DC
  Administered 2015-02-26 – 2015-02-28 (×10): 3 mL via RESPIRATORY_TRACT
  Filled 2015-02-26 (×10): qty 3

## 2015-02-26 MED ORDER — PREGABALIN 75 MG PO CAPS
200.0000 mg | ORAL_CAPSULE | Freq: Two times a day (BID) | ORAL | Status: DC
  Administered 2015-02-26 – 2015-03-02 (×7): 200 mg via ORAL
  Filled 2015-02-26 (×8): qty 2

## 2015-02-26 MED ORDER — METHYLPREDNISOLONE SODIUM SUCC 125 MG IJ SOLR: 125.0000 mg | Freq: Once | INTRAMUSCULAR | Status: DC

## 2015-02-26 MED ORDER — ALUM & MAG HYDROXIDE-SIMETH 200-200-20 MG/5ML PO SUSP: 30.0000 mL | Freq: Four times a day (QID) | ORAL | Status: DC | PRN

## 2015-02-26 MED ORDER — OXYBUTYNIN CHLORIDE 5 MG PO TABS
5.0000 mg | ORAL_TABLET | Freq: Two times a day (BID) | ORAL | Status: DC
  Administered 2015-02-28 – 2015-03-02 (×3): 5 mg via ORAL
  Filled 2015-02-26 (×7): qty 1

## 2015-02-26 MED ORDER — PANTOPRAZOLE SODIUM 40 MG PO TBEC
40.0000 mg | DELAYED_RELEASE_TABLET | Freq: Every day | ORAL | Status: DC
  Administered 2015-02-26 – 2015-03-03 (×6): 40 mg via ORAL
  Filled 2015-02-26 (×6): qty 1

## 2015-02-26 MED ORDER — CLONAZEPAM 1 MG PO TABS
1.0000 mg | ORAL_TABLET | Freq: Three times a day (TID) | ORAL | Status: DC | PRN
  Administered 2015-02-27: 1 mg via ORAL
  Filled 2015-02-26: qty 1

## 2015-02-26 MED ORDER — TIOTROPIUM BROMIDE MONOHYDRATE 18 MCG IN CAPS
18.0000 ug | ORAL_CAPSULE | Freq: Every day | RESPIRATORY_TRACT | Status: DC
  Administered 2015-02-26 – 2015-03-03 (×6): 18 ug via RESPIRATORY_TRACT
  Filled 2015-02-26 (×2): qty 5

## 2015-02-26 MED ORDER — ONDANSETRON HCL 4 MG PO TABS: 4.0000 mg | ORAL_TABLET | Freq: Four times a day (QID) | ORAL | Status: DC | PRN

## 2015-02-26 MED ORDER — IPRATROPIUM-ALBUTEROL 0.5-2.5 (3) MG/3ML IN SOLN: 6.0000 mL | Freq: Four times a day (QID) | RESPIRATORY_TRACT | Status: DC

## 2015-02-26 MED ORDER — BISACODYL 5 MG PO TBEC
5.0000 mg | DELAYED_RELEASE_TABLET | Freq: Every day | ORAL | Status: DC | PRN
  Administered 2015-03-01: 5 mg via ORAL
  Filled 2015-02-26: qty 1

## 2015-02-26 MED ORDER — IPRATROPIUM-ALBUTEROL 0.5-2.5 (3) MG/3ML IN SOLN
3.0000 mL | Freq: Once | RESPIRATORY_TRACT | Status: AC
  Administered 2015-02-26: 3 mL via RESPIRATORY_TRACT

## 2015-02-26 MED ORDER — TIOTROPIUM BROMIDE MONOHYDRATE 18 MCG IN CAPS: 18.0000 ug | ORAL_CAPSULE | Freq: Every day | RESPIRATORY_TRACT | Status: AC

## 2015-02-26 NOTE — ED Notes (Signed)
Lab notified to add PTT-INR, spoke with Ki, states will add.

## 2015-02-26 NOTE — Consult Note (Signed)
ANTICOAGULATION CONSULT NOTE - Initial Consult  Pharmacy Consult for warfarin Indication: VTE prophylaxis  Allergies  Allergen Reactions  . Sulfa Antibiotics Hives    Patient Measurements: Height: '5\' 6"'$  (167.6 cm) Weight: 192 lb (87.091 kg) IBW/kg (Calculated) : 59.3 Heparin Dosing Weight:   Vital Signs: Temp: 98.7 F (37.1 C) (02/20 1733) Temp Source: Oral (02/20 1733) BP: 123/64 mmHg (02/20 1733) Pulse Rate: 87 (02/20 1733)  Labs:  Recent Labs  02/26/15 1229 02/26/15 1230  HGB 10.9*  --   HCT 32.4*  --   PLT 200  --   LABPROT  --  14.7  INR  --  1.13  CREATININE 0.80  --     Estimated Creatinine Clearance: 85.2 mL/min (by C-G formula based on Cr of 0.8).   Medical History: Past Medical History  Diagnosis Date  . Schizophrenia ([place])   . Asthma   . GERD (gastroesophageal reflux disease)   . Anxiety   . Depression   . Bipolar disorder ([place])   . COPD (chronic obstructive pulmonary disease) ([place])   . Occasional tremors     right hand  . PTSD (post-traumatic stress disorder)   . Shortness of breath dyspnea   . Fibromyalgia   . DVT (deep venous thrombosis) ([place]) 2011    RUE  . Thyroid nodule   . DDD (degenerative disc disease), lumbar   . Spinal stenosis   . Peripheral neuropathy ([place])   . Rotator cuff tear     right  . Pneumonia 2011  . Hypothyroidism     no meds currently  . Anemia     during pregnancy only  . Hypertension     Off meds x 15 years-well controlled now per pt  . Squamous cell cancer, anus (H[facility])   . Severe obstructive sleep apnea 06/27/2014  . DVT of upper extremity (deep vein thrombosis) ([place]) 10/14/2014    Medications:  Scheduled:  . ARIPiprazole  2 mg Oral Daily  . baclofen  10 mg Oral TID  . budesonide-formoterol  2 puff Inhalation BID  . calcium-vitamin D  1 tablet Oral BID  . docusate sodium  100 mg Oral Daily  . DULoxetine  60 mg Oral Daily  . escitalopram  20 mg Oral Daily  . ipratropium-albuterol  3 mL Nebulization  Q4H  . methylPREDNISolone (SOLU-MEDROL) injection  60 mg Intravenous 3 times per day  . oxybutynin  5 mg Oral BID  . pantoprazole  40 mg Oral Daily  . pregabalin  200 mg Oral BID  . tiotropium  18 mcg Inhalation Daily  . warfarin  1 mg Oral Daily  . warfarin  5 mg Oral Daily    Assessment: Pt is a 60 year old female with a recent DVT taking warfarin at home. Home dose per patient is '8mg'$  daily. Pt had held her warfarin from 2/10 to 2/17 to have an edoscopy/colonoscopy but was unable to have the procedure. Pt resumed warfarin on the 17th. Today her INR is 1.13.  Goal of Therapy:  INR 2-3 Monitor platelets by anticoagulation protocol: Yes   Plan:  Will resume pt home dose of '8mg'$  daily. Will recheck INR in the AM. Pharmacy to continue to monitor. Thank you for the consult.  Ramond Dial, Pharm.D Clinical Pharmacist   02/26/2015,6:16 PM

## 2015-02-26 NOTE — ED Provider Notes (Signed)
[facility] Emergency Department Provider Note  ____________________________________________  Time seen: Approximately 12:25 PM  I have reviewed the triage vital signs and the nursing notes.   HISTORY  Chief Complaint Shortness of Breath    HPI [name] is a 60 y.o. female with a history of COPD who is presenting today for shortness of breath. She says that over the past month she has had 2 courses of antibiotics including clindamycin and then doxycycline. She is also on one course of steroids. She is also been using an inhaler. She says she finished her steroids about a week ago and has worsening shortness of breath with cough with clear sputum production ever since. She says that she has not smoked in 2 days and plans on not starting up again. She says she has quit smoking multiple times in the past and the longest she has not smoked for 1 year. She currently has a pulmonologist and has follow-up scheduled for March 2.Patient was given a DuoNeb en route with improvement in her oxygen saturation. Was initially found in the 62s. Does not use home oxygen.   Past Medical History  Diagnosis Date  . Schizophrenia ([place])   . Asthma   . GERD (gastroesophageal reflux disease)   . Anxiety   . Depression   . Bipolar disorder ([place])   . COPD (chronic obstructive pulmonary disease) ([place])   . Occasional tremors     right hand  . PTSD (post-traumatic stress disorder)   . Shortness of breath dyspnea   . Fibromyalgia   . DVT (deep venous thrombosis) ([place]) 2011    RUE  . Thyroid nodule   . DDD (degenerative disc disease), lumbar   . Spinal stenosis   . Peripheral neuropathy ([place])   . Rotator cuff tear     right  . Pneumonia 2011  . Hypothyroidism     no meds currently  . Anemia     during pregnancy only  . Hypertension     Off meds x 15 years-well controlled now per pt  . Squamous cell cancer, anus (HCC)   . Severe obstructive sleep apnea 06/27/2014  .  DVT of upper extremity (deep vein thrombosis) ([facility]) 10/14/2014    Patient Active Problem List   Diagnosis Date Noted  . COPD, very severe ([place]) 12/12/2014  . Fibromyalgia 12/12/2014  . DVT of upper extremity (deep vein thrombosis) ([place]) 10/14/2014  . B12 deficiency 09/28/2014  . Folate deficiency 09/28/2014  . Macrocytosis 09/21/2014  . Depression   . Hypokalemia 07/20/2014  . Anal cancer ([place]) 06/27/2014  . Anal fissure 06/27/2014  . Anxiety 06/27/2014  . Airway hyperreactivity 06/27/2014  . H/O manic depressive disorder 06/27/2014  . CAFL (chronic airflow limitation) ([place]) 06/27/2014  . Clinical depression 06/27/2014  . Deep vein thrombosis ([place]) 06/27/2014  . External hemorrhoid 06/27/2014  . Myalgia and myositis 06/27/2014  . Acid reflux 06/27/2014  . Hemorrhoid 06/27/2014  . BP (high blood pressure) 06/27/2014  . HLD (hyperlipidemia) 06/27/2014  . Adult hypothyroidism 06/27/2014  . LBP (low back pain) 06/27/2014  . Mass of perianal area 06/27/2014  . External hemorrhoid, thrombosed 06/27/2014  . 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 Ulcerated hemorrhoid 06/27/2014  . Severe obstructive sleep apnea 06/27/2014  . Squamous cell cancer, anus (HCC)   . Rectal ulcer 05/17/2014    Past Surgical History  Procedure Laterality Date  . Foot surgery Right   . Tubal ligation    . Eye surgery Bilateral   .  Mouth surgery  2002  . Rectal biopsy N/A 05/08/2014    Procedure: BIOPSY RECTAL;  Surgeon: Marlyce Huge, MD;  Location: [facility] ORS;  Service: General;  Laterality: N/A;  . Evaluation under anesthesia with hemorrhoidectomy N/A 05/08/2014    Procedure: EXAM UNDER ANESTHESIA WITH HEMORRHOIDECTOMY;  Surgeon: Marlyce Huge, MD;  Location: [facility] ORS;  Service: General;  Laterality: N/A;  . Portacath placement N/A 05/20/2014    Procedure: INSERTION PORT-A-CATH;  Surgeon: Florene Glen, MD;  Location: [facility] ORS;  Service: General;  Laterality: N/A;  . Laparoscopic diverted  colostomy N/A 05/26/2014    Procedure: LAPAROSCOPIC DIVERTED COLOSTOMY;  Surgeon: Marlyce Huge, MD;  Location: [facility] ORS;  Service: General;  Laterality: N/A;  . Peripheral vascular catheterization Left 10/16/2014    Procedure: Upper Extremity Venography with thrombectomy, port removal;  Surgeon: Algernon Huxley, MD;  Location: [facility];  Service: Cardiovascular;  Laterality: Left;  . Peripheral vascular catheterization  10/16/2014    Procedure: Upper Extremity Intervention;  Surgeon: Algernon Huxley, MD;  Location: [facility];  Service: Cardiovascular;;    Current Outpatient Rx  Name  Route  Sig  Dispense  Refill  . albuterol (PROVENTIL HFA;VENTOLIN HFA) 108 (90 BASE) MCG/ACT inhaler   Inhalation   Inhale 1 puff into the lungs every 6 (six) hours as needed for wheezing or shortness of breath.         . ARIPiprazole (ABILIFY) 2 MG tablet   Oral   Take 1 tablet (2 mg total) by mouth daily.   30 tablet   4   . baclofen (LIORESAL) 10 MG tablet   Oral   Take 1 tablet (10 mg total) by mouth 3 (three) times daily.   90 each   6   . budesonide-formoterol (SYMBICORT) 160-4.5 MCG/ACT inhaler   Inhalation   Inhale 2 puffs into the lungs 2 (two) times daily.   1 Inhaler   12   . Calcium Carb-Cholecalciferol (CALCIUM 600+D) 600-800 MG-UNIT TABS   Oral   Take 1 tablet by mouth 2 (two) times daily.          . Cholecalciferol (VITAMIN D-1000 MAX ST) 1000 units tablet   Oral   Take by mouth.         . clindamycin (CLEOCIN) 300 MG capsule   Oral   Take 1 capsule (300 mg total) by mouth 3 (three) times daily.   21 capsule   0   . clonazePAM (KLONOPIN) 1 MG tablet   Oral   Take 1 tablet (1 mg total) by mouth 3 (three) times daily as needed for anxiety.   90 tablet   4   . dicyclomine (BENTYL) 10 MG capsule   Oral   Take 10 mg by mouth 4 (four) times daily as needed.         . docusate sodium (COLACE) 100 MG capsule   Oral   Take 100 mg by mouth  daily.         Marland Kitchen doxycycline (MONODOX) 100 MG capsule   Oral   Take 1 capsule (100 mg total) by mouth 2 (two) times daily.   14 capsule   0   . DULoxetine (CYMBALTA) 60 MG capsule   Oral   Take 1 capsule (60 mg total) by mouth daily.   30 capsule   4     HOLD UNTIL PATIENT CALLS FOR REFILL   . escitalopram (LEXAPRO) 20 MG tablet   Oral   Take  1 tablet (20 mg total) by mouth daily.   30 tablet   4     HOLD UNTIL PATIENT CALLS FOR REFILL   . etodolac (LODINE) 500 MG tablet   Oral   Take by mouth. Reported on 01/31/2015         . mometasone (ELOCON) 0.1 % cream   Topical   Apply topically. Reported on 01/31/2015         . oxybutynin (DITROPAN) 5 MG tablet   Oral   Take 5 mg by mouth 2 (two) times daily.         Marland Kitchen oxyCODONE-acetaminophen (PERCOCET) 10-325 MG tablet   Oral   Take 1 tablet by mouth every 6 (six) hours as needed for pain.   60 tablet   0   . pantoprazole (PROTONIX) 40 MG tablet   Oral   Take by mouth.         . pregabalin (LYRICA) 200 MG capsule   Oral   Take 1 capsule (200 mg total) by mouth 2 (two) times daily.   90 capsule   6   . tiotropium (SPIRIVA) 18 MCG inhalation capsule   Inhalation   Place 1 capsule (18 mcg total) into inhaler and inhale daily.   30 capsule   12   . warfarin (COUMADIN) 1 MG tablet   Oral   Take 1 tablet (1 mg total) by mouth daily.   90 tablet   0   . warfarin (COUMADIN) 5 MG tablet   Oral   Take 1 tablet (5 mg total) by mouth daily.   30 tablet   0   . zolpidem (AMBIEN) 10 MG tablet   Oral   Take 1 tablet (10 mg total) by mouth at bedtime as needed for sleep.   30 tablet   4     Allergies Sulfa antibiotics  Family History  Problem Relation Age of Onset  . Cancer Maternal Aunt   . Cancer Paternal Uncle   . Diabetes Mother   . Thyroid disease Mother   . Kidney failure Mother   . Hypertension Mother   . Depression Mother   . Mental illness Son   . Aneurysm Maternal Grandmother   .  Thyroid disease Maternal Grandmother   . Stroke Maternal Grandfather   . Hypertension Maternal Grandfather   . Diabetes Maternal Grandfather   . Heart disease Maternal Grandfather     MI  . Nephrolithiasis Daughter     Social History Social History  Substance Use Topics  . Smoking status: Current Every Day Smoker -- 0.25 packs/day for 44 years    Types: Cigarettes    Start date: 10/04/1970  . Smokeless tobacco: Never Used  . Alcohol Use: No     Comment: 1 drink every 2-3 times/year    Review of Systems Constitutional: No fever/chills Eyes: No visual changes. ENT: No sore throat. Cardiovascular: Denies chest pain. Respiratory: As above Gastrointestinal: No abdominal pain.  No nausea, no vomiting.  No diarrhea.  No constipation. Genitourinary: Negative for dysuria. Musculoskeletal: Negative for back pain. Skin: Negative for rash. Neurological: Negative for headaches, focal weakness or numbness.  10-point ROS otherwise negative.  ____________________________________________   PHYSICAL EXAM:  VITAL SIGNS: ED Triage Vitals  Enc Vitals Group     BP 02/26/15 1226 122/100 mmHg     Pulse Rate 02/26/15 1226 51     Resp 02/26/15 1226 15     Temp 02/26/15 1226 98.1 F (36.7 C)  Temp Source 02/26/15 1226 Oral     SpO2 02/26/15 1226 95 %     Weight 02/26/15 1226 191 lb (86.637 kg)     Height --      Head Cir --      Peak Flow --      Pain Score --      Pain Loc --      Pain Edu? --      Excl. in [place]? --     Constitutional: Alert and oriented. Well appearing and in no acute distress. Eyes: Conjunctivae are normal. PERRL. EOMI. Head: Atraumatic. Nose: No congestion/rhinnorhea. Mouth/Throat: Mucous membranes are moist.  Oropharynx non-erythematous. Neck: No stridor.   Cardiovascular: Normal rate, regular rhythm. Grossly normal heart sounds.  Good peripheral circulation. Respiratory: Normal respiratory effort.  No retractions. Speaks in full sentences. No respiratory  distress. Wheezing throughout with good air movement but with slightly prolonged expiratory phase. Gastrointestinal: Soft and nontender. No distention. No abdominal bruits. No CVA tenderness. Musculoskeletal: No lower extremity tenderness nor edema.  No joint effusions. Neurologic:  Normal speech and language. No gross focal neurologic deficits are appreciated.  Skin:  Skin is warm, dry and intact. No rash noted. Psychiatric: Mood and affect are normal. Speech and behavior are normal.  ____________________________________________   LABS (all labs ordered are listed, but only abnormal results are displayed)  Labs Reviewed  CBC - Abnormal; Notable for the following:    RBC 3.49 (*)    Hemoglobin 10.9 (*)    HCT 32.4 (*)    RDW 16.0 (*)    All other components within normal limits  BASIC METABOLIC PANEL   ____________________________________________  EKG  ED ECG REPORT I, Doran Stabler, the attending physician, personally viewed and interpreted this ECG.   Date: 02/26/2015  EKG Time: 1225  Rate: 82  Rhythm: normal sinus rhythm  Axis: Normal axis  Intervals:none  ST&T Change: No ST elevation or depression. No abnormal T-wave inversion.  ____________________________________________  RADIOLOGY  No acute cardiopulmonary process ____________________________________________   PROCEDURES  ____________________________________________   INITIAL IMPRESSION / ASSESSMENT AND PLAN / ED COURSE  Pertinent labs & imaging results that were available during my care of the patient were reviewed by me and considered in my medical decision making (see chart for details).  ----------------------------------------- 3:01 PM on 02/26/2015 -----------------------------------------  Patient resting comfortably when and the room with respiratory rate of 16. However, when I woke her up she said that she is still coughing. When she began talking she began to become tachypneic. I would  listen to her lungs and she is still wheezing diffusely. I think admission to the hospital would be the best course of action at this time. She understands the plan and is willing to comply. Signed out to Dr.Modi of the hospitalist service. ____________________________________________   FINAL CLINICAL IMPRESSION(S) / ED DIAGNOSES  Acute COPD exacerbation.    Orbie Pyo, MD 02/26/15 [phone]

## 2015-02-26 NOTE — H&P (Signed)
[facility] NAME: [name]    MR#:  [id number]  DATE OF BIRTH:  November 16, 1955  DATE OF ADMISSION:  02/26/2015  PRIMARY CARE PHYSICIAN: [name], NP   REQUESTING/REFERRING PHYSICIAN: Dr Dineen Kid  CHIEF COMPLAINT:   Shortness of breath and wheezing HISTORY OF PRESENT ILLNESS:  [name]  is a 61 y.o. female with a known history of asthma/COPD and PTSD who presents with above complaint. Patient reports over the past several weeks she's been treated for upper respiratory infection and COPD exacerbation. She has been treated with clindamycin and Doxycycline along with prednisone. She continues to have shortness of breath and wheezing. In the emergency room she received nebulizer treatments and started IV steroids.  PAST MEDICAL HISTORY:   Past Medical History  Diagnosis Date  . Schizophrenia ([place])   . Asthma   . GERD (gastroesophageal reflux disease)   . Anxiety   . Depression   . Bipolar disorder ([place])   . COPD (chronic obstructive pulmonary disease) ([place])   . Occasional tremors     right hand  . PTSD (post-traumatic stress disorder)   . Shortness of breath dyspnea   . Fibromyalgia   . DVT (deep venous thrombosis) ([place]) 2011    RUE  . Thyroid nodule   . DDD (degenerative disc disease), lumbar   . Spinal stenosis   . Peripheral neuropathy ([place])   . Rotator cuff tear     right  . Pneumonia 2011  . Hypothyroidism     no meds currently  . Anemia     during pregnancy only  . Hypertension     Off meds x 15 years-well controlled now per pt  . Squamous cell cancer, anus (HCC)   . Severe obstructive sleep apnea 06/27/2014  . DVT of upper extremity (deep vein thrombosis) ([place]) 10/14/2014    PAST SURGICAL HISTORY:   Past Surgical History  Procedure Laterality Date  . Foot surgery Right   . Tubal ligation    . Eye surgery Bilateral   . Mouth surgery  2002  . Rectal biopsy N/A 05/08/2014    Procedure:  BIOPSY RECTAL;  Surgeon: Marlyce Huge, MD;  Location: [facility] ORS;  Service: General;  Laterality: N/A;  . Evaluation under anesthesia with hemorrhoidectomy N/A 05/08/2014    Procedure: EXAM UNDER ANESTHESIA WITH HEMORRHOIDECTOMY;  Surgeon: Marlyce Huge, MD;  Location: [facility] ORS;  Service: General;  Laterality: N/A;  . Portacath placement N/A 05/20/2014    Procedure: INSERTION PORT-A-CATH;  Surgeon: Florene Glen, MD;  Location: [facility] ORS;  Service: General;  Laterality: N/A;  . Laparoscopic diverted colostomy N/A 05/26/2014    Procedure: LAPAROSCOPIC DIVERTED COLOSTOMY;  Surgeon: Marlyce Huge, MD;  Location: [facility] ORS;  Service: General;  Laterality: N/A;  . Peripheral vascular catheterization Left 10/16/2014    Procedure: Upper Extremity Venography with thrombectomy, port removal;  Surgeon: Algernon Huxley, MD;  Location: [facility];  Service: Cardiovascular;  Laterality: Left;  . Peripheral vascular catheterization  10/16/2014    Procedure: Upper Extremity Intervention;  Surgeon: Algernon Huxley, MD;  Location: [facility];  Service: Cardiovascular;;    SOCIAL HISTORY:   Social History  Substance Use Topics  . Smoking status: Current Every Day Smoker -- 0.25 packs/day for 44 years    Types: Cigarettes    Start date: 10/04/1970  . Smokeless tobacco: Never Used  . Alcohol Use: No     Comment: 1 drink  every 2-3 times/year    FAMILY HISTORY:   Family History  Problem Relation Age of Onset  . Cancer Maternal Aunt   . Cancer Paternal Uncle   . Diabetes Mother   . Thyroid disease Mother   . Kidney failure Mother   . Hypertension Mother   . Depression Mother   . Mental illness Son   . Aneurysm Maternal Grandmother   . Thyroid disease Maternal Grandmother   . Stroke Maternal Grandfather   . Hypertension Maternal Grandfather   . Diabetes Maternal Grandfather   . Heart disease Maternal Grandfather     MI  . Nephrolithiasis Daughter     DRUG  ALLERGIES:   Allergies  Allergen Reactions  . Sulfa Antibiotics Hives     REVIEW OF SYSTEMS:  CONSTITUTIONAL: No fever, fatigue or weakness.  EYES: No blurred or double vision.  EARS, NOSE, AND THROAT: No tinnitus or ear pain.  RESPIRATORY: ++cough, shortness of breath, wheezing NO hemoptysis.  CARDIOVASCULAR: No chest pain, orthopnea, edema.  GASTROINTESTINAL: No nausea, vomiting, diarrhea or abdominal pain.  GENITOURINARY: No dysuria, hematuria.  ENDOCRINE: No polyuria, nocturia,  HEMATOLOGY: No anemia, easy bruising or bleeding SKIN: No rash or lesion. MUSCULOSKELETAL: No joint pain or arthritis.   NEUROLOGIC: No tingling, numbness, weakness.  PSYCHIATRY: No anxiety or depression.   MEDICATIONS AT HOME:   Prior to Admission medications   Medication Sig Start Date End Date Taking? Authorizing Provider  albuterol (PROVENTIL HFA;VENTOLIN HFA) 108 (90 BASE) MCG/ACT inhaler Inhale 1 puff into the lungs every 6 (six) hours as needed for wheezing or shortness of breath.    Historical Provider, MD  ARIPiprazole (ABILIFY) 2 MG tablet Take 1 tablet (2 mg total) by mouth daily. 01/31/15   Marjie Skiff, MD  baclofen (LIORESAL) 10 MG tablet Take 1 tablet (10 mg total) by mouth 3 (three) times daily. 02/09/15   [name], NP  budesonide-formoterol [facility]) 160-4.5 MCG/ACT inhaler Inhale 2 puffs into the lungs 2 (two) times daily. 01/30/15   [name], NP  Calcium Carb-Cholecalciferol (CALCIUM 600+D) 600-800 MG-UNIT TABS Take 1 tablet by mouth 2 (two) times daily.     Historical Provider, MD  Cholecalciferol (VITAMIN D-1000 MAX ST) 1000 units tablet Take by mouth.    Historical Provider, MD  clindamycin (CLEOCIN) 300 MG capsule Take 1 capsule (300 mg total) by mouth 3 (three) times daily. 01/30/15   [name], NP  clonazePAM (KLONOPIN) 1 MG tablet Take 1 tablet (1 mg total) by mouth 3 (three) times daily as needed for anxiety. 01/31/15   Marjie Skiff, MD  dicyclomine (BENTYL)  10 MG capsule Take 10 mg by mouth 4 (four) times daily as needed. 01/29/15 01/29/16  Historical Provider, MD  docusate sodium (COLACE) 100 MG capsule Take 100 mg by mouth daily.    Historical Provider, MD  doxycycline (MONODOX) 100 MG capsule Take 1 capsule (100 mg total) by mouth 2 (two) times daily. 02/07/15   Wilhelmina Mcardle, MD  DULoxetine (CYMBALTA) 60 MG capsule Take 1 capsule (60 mg total) by mouth daily. 01/31/15   Marjie Skiff, MD  escitalopram (LEXAPRO) 20 MG tablet Take 1 tablet (20 mg total) by mouth daily. 01/31/15   Marjie Skiff, MD  etodolac (LODINE) 500 MG tablet Take by mouth. Reported on 01/31/2015    Historical Provider, MD  mometasone (ELOCON) 0.1 % cream Apply topically. Reported on 01/31/2015    Historical Provider, MD  oxybutynin (DITROPAN) 5 MG tablet Take 5 mg  by mouth 2 (two) times daily.    Historical Provider, MD  oxyCODONE-acetaminophen (PERCOCET) 10-325 MG tablet Take 1 tablet by mouth every 6 (six) hours as needed for pain. 02/22/15   Lequita Asal, MD  pantoprazole (PROTONIX) 40 MG tablet Take by mouth. 01/29/15   Historical Provider, MD  pregabalin (LYRICA) 200 MG capsule Take 1 capsule (200 mg total) by mouth 2 (two) times daily. 01/23/15   [name], NP  tiotropium (SPIRIVA) 18 MCG inhalation capsule Place 1 capsule (18 mcg total) into inhaler and inhale daily. 02/26/15   [name], NP  warfarin (COUMADIN) 1 MG tablet Take 1 tablet (1 mg total) by mouth daily. 01/23/15   [name], NP  warfarin (COUMADIN) 5 MG tablet Take 1 tablet (5 mg total) by mouth daily. 10/22/14   Paulette Blanch, MD  zolpidem (AMBIEN) 10 MG tablet Take 1 tablet (10 mg total) by mouth at bedtime as needed for sleep. 01/31/15   Marjie Skiff, MD      VITAL SIGNS:  Blood pressure 145/81, pulse 90, temperature 98.1 F (36.7 C), temperature source Oral, resp. rate 15, weight 86.637 kg (191 lb), SpO2 100 %.  PHYSICAL EXAMINATION:  GENERAL:  60 y.o.-year-old patient lying in the bed  with no acute distress.  EYES: Pupils equal, round, reactive to light and accommodation. No scleral icterus. Extraocular muscles intact.  HEENT: Head atraumatic, normocephalic. Oropharynx and nasopharynx clear.  NECK:  Supple, no jugular venous distention. No thyroid enlargement, no tenderness.  LUNGS: Diffuse wheezing with prolonged expiration and rhonchi. No use of accessory muscles of respiration.  CARDIOVASCULAR: S1, S2 normal. No murmurs, rubs, or gallops.  ABDOMEN: Soft, nontender, nondistended. Bowel sounds present. No organomegaly or mass.  EXTREMITIES: No pedal edema, cyanosis, or clubbing.  NEUROLOGIC: Cranial nerves II through XII are grossly intact. No focal deficits. PSYCHIATRIC: The patient is alert and oriented x 3.  SKIN: No obvious rash, lesion, or ulcer.   LABORATORY PANEL:   CBC  Recent Labs Lab 02/26/15 1229  WBC 7.0  HGB 10.9*  HCT 32.4*  PLT 200   ------------------------------------------------------------------------------------------------------------------  Chemistries   Recent Labs Lab 02/26/15 1229  NA 139  K 3.6  CL 105  CO2 28  GLUCOSE 102*  BUN 11  CREATININE 0.80  CALCIUM 8.5*   ------------------------------------------------------------------------------------------------------------------  Cardiac Enzymes No results for input(s): TROPONINI in the last 168 hours. ------------------------------------------------------------------------------------------------------------------  RADIOLOGY:  Dg Chest 1 View  02/26/2015  CLINICAL DATA:  60 year old female with shortness of breath EXAM: CHEST 1 VIEW COMPARISON:  Chest CT dated 10/22/2014 FINDINGS: Single portable view of the chest does not demonstrate a focal consolidation. There is no pleural effusion or pneumothorax. The cardiac silhouette is within normal limits with the osseous structures are grossly unremarkable. IMPRESSION: No acute cardiopulmonary process. Electronically Signed   By:  Anner Crete M.D.   On: 02/26/2015 14:41    EKG:   Normal sinus rhythm no ST elevation or depression  IMPRESSION AND PLAN:    60 year old female with a history of COPD and fibromyalgia who presents with COPD exacerbation and who has failed outpatient treatment for COPD.  1. Acute COPD exacerbation: Patient has failed outpatient treatment. She has been on a full course of antibiotics including doxycycline and clindamycin. At this point I do not think she needs antibiotics, however she does need IV steroids and nebulizer treatments. I will also consult her pulmonologist Dr. Jamal Collin.  2. Schizophrenia: Continue Abilify, Lexapro, Cymbalta and clonazepam.  3.  Fibromyalgia: Continue baclofen and Lyrica  4. History of DVT left upper external he: Continue Coumadin and pharmacy to help dose    All the records are reviewed and case discussed with ED provider. Management plans discussed with the patient and she is in agreement.  CODE STATUS: FULL  TOTAL TIME TAKING CARE OF THIS PATIENT: 50 minutes.    Christabella Alvira M.D on 02/26/2015 at 3:16 PM  Between 7am to 6pm - Pager - [phone] After 6pm go to [email or web address] - password [email or web address] [facility]  Office  [phone]  CC: Primary care physician; [name], NP

## 2015-02-26 NOTE — ED Notes (Signed)
Per [facility]:  Called out for increasing shortness of breath, recently went to pulmonologist and was given inhaler.  Inhaler is not helping.  Ems found patient 84% room air, wheezing bilateral, diminished breath sounds, slightly tachypnic, prolonged expiratory phase, with retractions.  duoneb'd and lungs cleared up and are now audible in all fields., still has minor expiratory wheezes, spo2 is now 94%.  152/88, NRS.  Looks better

## 2015-02-26 NOTE — Telephone Encounter (Signed)
Routing to provider  

## 2015-02-26 NOTE — Progress Notes (Signed)
Per protocol, female patients are restricted to '5mg'$  of ambien. Order has been changed.  Ramond Dial, Pharm.D Clinical Pharmacist

## 2015-02-26 NOTE — Telephone Encounter (Signed)
tiotropium (SPIRIVA) 18 MCG inhalation capsule  Patient needs refill on her tiotropium (SPIRIVA) 18 MCG inhalation capsule send to her pharmacy [facility], [place].

## 2015-02-27 ENCOUNTER — Encounter: Payer: Self-pay | Admitting: Radiology

## 2015-02-27 ENCOUNTER — Inpatient Hospital Stay: Payer: Commercial Managed Care - HMO

## 2015-02-27 DIAGNOSIS — R1013 Epigastric pain: Secondary | ICD-10-CM

## 2015-02-27 DIAGNOSIS — I82402 Acute embolism and thrombosis of unspecified deep veins of left lower extremity: Secondary | ICD-10-CM

## 2015-02-27 DIAGNOSIS — G894 Chronic pain syndrome: Secondary | ICD-10-CM

## 2015-02-27 DIAGNOSIS — R739 Hyperglycemia, unspecified: Secondary | ICD-10-CM

## 2015-02-27 DIAGNOSIS — J690 Pneumonitis due to inhalation of food and vomit: Secondary | ICD-10-CM | POA: Insufficient documentation

## 2015-02-27 DIAGNOSIS — K56609 Unspecified intestinal obstruction, unspecified as to partial versus complete obstruction: Secondary | ICD-10-CM | POA: Insufficient documentation

## 2015-02-27 DIAGNOSIS — G8929 Other chronic pain: Secondary | ICD-10-CM

## 2015-02-27 DIAGNOSIS — J441 Chronic obstructive pulmonary disease with (acute) exacerbation: Secondary | ICD-10-CM

## 2015-02-27 LAB — BASIC METABOLIC PANEL
Anion gap: 7 (ref 5–15)
BUN: 13 mg/dL (ref 6–20)
CHLORIDE: 106 mmol/L (ref 101–111)
CO2: 24 mmol/L (ref 22–32)
CREATININE: 0.84 mg/dL (ref 0.44–1.00)
Calcium: 8.5 mg/dL — ABNORMAL LOW (ref 8.9–10.3)
GFR calc non Af Amer: >60 mL/min (ref >60)
GLUCOSE: 135 mg/dL — AB (ref 65–99)
Potassium: 4 mmol/L (ref 3.5–5.1)
Sodium: 137 mmol/L (ref 135–145)

## 2015-02-27 LAB — CBC
HEMATOCRIT: 28.6 % — AB (ref 35.0–47.0)
HEMOGLOBIN: 9.7 g/dL — AB (ref 12.0–16.0)
MCH: 31.7 pg (ref 26.0–34.0)
MCHC: 33.8 g/dL (ref 32.0–36.0)
MCV: 93.9 fL (ref 80.0–100.0)
Platelets: 172 10*3/uL (ref 150–440)
RBC: 3.04 MIL/uL — ABNORMAL LOW (ref 3.80–5.20)
RDW: 15.9 % — ABNORMAL HIGH (ref 11.5–14.5)
WBC: 5.5 10*3/uL (ref 3.6–11.0)

## 2015-02-27 LAB — PROTIME-INR
INR: 1.14
Prothrombin Time: 14.8 seconds (ref 11.4–15.0)

## 2015-02-27 LAB — HEMOGLOBIN A1C: Hgb A1c MFr Bld: 4.7 % (ref 4.0–6.0)

## 2015-02-27 MED ORDER — BISACODYL 5 MG PO TBEC: 5.0000 mg | DELAYED_RELEASE_TABLET | Freq: Every day | ORAL | Status: DC | PRN

## 2015-02-27 MED ORDER — IOHEXOL 300 MG/ML  SOLN
100.0000 mL | Freq: Once | INTRAMUSCULAR | Status: AC | PRN
  Administered 2015-02-27: 14:00:00 100 mL via INTRAVENOUS

## 2015-02-27 MED ORDER — OXYCODONE HCL 5 MG PO TABS
15.0000 mg | ORAL_TABLET | Freq: Four times a day (QID) | ORAL | Status: DC | PRN
  Administered 2015-02-27 – 2015-03-03 (×6): 15 mg via ORAL
  Filled 2015-02-27 (×7): qty 3

## 2015-02-27 MED ORDER — IOHEXOL 240 MG/ML SOLN
25.0000 mL | INTRAMUSCULAR | Status: AC
Start: 2015-02-27 — End: 2015-02-27
  Administered 2015-02-27 (×2): 25 mL via ORAL

## 2015-02-27 MED ORDER — IOHEXOL 240 MG/ML SOLN: 25.0000 mL | INTRAMUSCULAR | Status: DC

## 2015-02-27 MED ORDER — PREDNISONE 10 MG (21) PO TBPK: 10.0000 mg | ORAL_TABLET | Freq: Every day | ORAL | Status: DC

## 2015-02-27 MED ORDER — SODIUM CHLORIDE 0.9 % IV SOLN
3.0000 g | Freq: Three times a day (TID) | INTRAVENOUS | Status: DC
  Administered 2015-02-27 – 2015-02-28 (×3): 3 g via INTRAVENOUS
  Filled 2015-02-27 (×4): qty 3

## 2015-02-27 MED ORDER — OXYCODONE HCL 15 MG PO TABS: 15.0000 mg | ORAL_TABLET | Freq: Four times a day (QID) | ORAL | Status: DC | PRN

## 2015-02-27 MED ORDER — ALBUTEROL SULFATE (2.5 MG/3ML) 0.083% IN NEBU: 2.5000 mg | INHALATION_SOLUTION | RESPIRATORY_TRACT | Status: AC

## 2015-02-27 MED ORDER — LEVOFLOXACIN 750 MG PO TABS: 750.0000 mg | ORAL_TABLET | Freq: Every day | ORAL | Status: DC

## 2015-02-27 MED ORDER — WARFARIN SODIUM 5 MG PO TABS
10.0000 mg | ORAL_TABLET | Freq: Once | ORAL | Status: AC
  Administered 2015-02-27: 19:00:00 10 mg via ORAL
  Filled 2015-02-27: qty 2

## 2015-02-27 MED ORDER — SENNOSIDES-DOCUSATE SODIUM 8.6-50 MG PO TABS: 1.0000 | ORAL_TABLET | Freq: Every evening | ORAL | Status: DC | PRN

## 2015-02-27 MED ORDER — BUDESONIDE-FORMOTEROL FUMARATE 160-4.5 MCG/ACT IN AERO: 2.0000 | INHALATION_SPRAY | Freq: Two times a day (BID) | RESPIRATORY_TRACT | Status: DC

## 2015-02-27 MED ORDER — SODIUM CHLORIDE 0.9 % IV SOLN: INTRAVENOUS | Status: DC

## 2015-02-27 NOTE — Progress Notes (Signed)
[facility] NAME: [name]    MR#:  [id number]  DATE OF BIRTH:  20-Nov-1955  SUBJECTIVE:  CHIEF COMPLAINT:   Chief Complaint  Patient presents with  . Shortness of Breath   patient is 60 year old Caucasian female with past medical history sniff and a history of COPD, asthma, PTSD,  squamous cell cancer of anus, who presents to the hospital with complaints of shortness of breath and wheezing, she was admitted for COPD exacerbation and feels improved now. While in the hospital, she started having epigastric abdominal pain, intermittent, and other than by intensity, there is no radiation or elevating or aggravating factors. CT scan of abdomen and pelvis revealed dilated small bowel loops suggesting ileus or obstruction, left lower quadrant ostomy, pelvic ascites, right pleural effusion, right lower lobe pneumonia, no melena. No metastasis was noted in abdomen and pelvis.  Patient feels better today in regards to her lung disease, she has no significant abdominal pain at present. She was initiated nothing by mouth regimen, but no IV fluids are administered due to pleural effusion and ascites.   Review of Systems  Constitutional: Negative for fever, chills and weight loss.  HENT: Negative for congestion.   Eyes: Negative for blurred vision and double vision.  Respiratory: Positive for cough, shortness of breath and wheezing. Negative for sputum production.   Cardiovascular: Negative for chest pain, palpitations, orthopnea, leg swelling and PND.  Gastrointestinal: Positive for abdominal pain. Negative for nausea, vomiting, diarrhea, constipation and blood in stool.  Genitourinary: Negative for dysuria, urgency, frequency and hematuria.  Musculoskeletal: Negative for falls.  Neurological: Negative for dizziness, tremors, focal weakness and headaches.  Endo/Heme/Allergies: Does not bruise/bleed easily.  Psychiatric/Behavioral:  Negative for depression. The patient does not have insomnia.     VITAL SIGNS: Blood pressure 133/57, pulse 80, temperature 97.7 F (36.5 C), temperature source Oral, resp. rate 19, height '5\' 6"'$  (1.676 m), weight 87.091 kg (192 lb), SpO2 97 %.  PHYSICAL EXAMINATION:   GENERAL:  60 y.o.-year-old patient lying in the bed with no acute distress.  EYES: Pupils equal, round, reactive to light and accommodation. No scleral icterus. Extraocular muscles intact.  HEENT: Head atraumatic, normocephalic. Oropharynx and nasopharynx clear.  NECK:  Supple, no jugular venous distention. No thyroid enlargement, no tenderness.  LUNGS: Diminished breath sounds bilaterally at bases, scattered wheezing, rales,rhonchi , but no crepitations. No use of accessory muscles of respiration.  CARDIOVASCULAR: S1, S2 normal. No murmurs, rubs, or gallops.  ABDOMEN: Soft, mildly tender in epigastric area,, mild distention. Bowel sounds present. No organomegaly or mass.  EXTREMITIES: No pedal edema, cyanosis, or clubbing.  NEUROLOGIC: Cranial nerves II through XII are intact. Muscle strength 5/5 in all extremities. Sensation intact. Gait not checked.  PSYCHIATRIC: The patient is alert and oriented x 3.  SKIN: No obvious rash, lesion, or ulcer.   ORDERS/RESULTS REVIEWED:   CBC  Recent Labs Lab 02/26/15 1229 02/27/15 0725  WBC 7.0 5.5  HGB 10.9* 9.7*  HCT 32.4* 28.6*  PLT 200 172  MCV 92.7 93.9  MCH 31.2 31.7  MCHC 33.6 33.8  RDW 16.0* 15.9*   ------------------------------------------------------------------------------------------------------------------  Chemistries   Recent Labs Lab 02/26/15 1229 02/27/15 0725  NA 139 137  K 3.6 4.0  CL 105 106  CO2 28 24  GLUCOSE 102* 135*  BUN 11 13  CREATININE 0.80 0.84  CALCIUM 8.5* 8.5*   ------------------------------------------------------------------------------------------------------------------ estimated creatinine clearance is 81.1 mL/min (by C-G  formula  based on Cr of 0.84). ------------------------------------------------------------------------------------------------------------------ No results for input(s): TSH, T4TOTAL, T3FREE, THYROIDAB in the last 72 hours.  Invalid input(s): FREET3  Cardiac Enzymes No results for input(s): CKMB, TROPONINI, MYOGLOBIN in the last 168 hours.  Invalid input(s): CK ------------------------------------------------------------------------------------------------------------------ Invalid input(s): POCBNP ---------------------------------------------------------------------------------------------------------------  RADIOLOGY: Dg Chest 1 View  02/26/2015  CLINICAL DATA:  60 year old female with shortness of breath EXAM: CHEST 1 VIEW COMPARISON:  Chest CT dated 10/22/2014 FINDINGS: Single portable view of the chest does not demonstrate a focal consolidation. There is no pleural effusion or pneumothorax. The cardiac silhouette is within normal limits with the osseous structures are grossly unremarkable. IMPRESSION: No acute cardiopulmonary process. Electronically Signed   By: Anner Crete M.D.   On: 02/26/2015 14:41   Ct Abdomen Pelvis W Contrast  02/27/2015  CLINICAL DATA:  60 year old female with epigastric abdominal pain. Initial encounter. Personal history of anal cancer. EXAM: CT ABDOMEN AND PELVIS WITH CONTRAST TECHNIQUE: Multidetector CT imaging of the abdomen and pelvis was performed using the standard protocol following bolus administration of intravenous contrast. CONTRAST:  186m OMNIPAQUE IOHEXOL 300 MG/ML  SOLN COMPARISON:  Portable chest 02/26/2015. Chest CTA 10/22/2014. Restaging chest abdomen and pelvis CT 05/19/2014. FINDINGS: New mildly lobulated, partially loculated appearing small right pleural effusion. Adjacent right lower lobe consolidation with air bronchograms (series 5, image 6). Mild left costophrenic angle opacity more resembles atelectasis. No lung base nodule identified. No  left pleural effusion or pericardial effusion. Widespread osteopenia and lower thoracic compression fractures appear stable since 2016. Chronic right mid sacral fracture. No acute or suspicious osseous lesion identified. No residual rectal or anal verge soft tissue mass. Presacral stranding may reflect sequelae of radiation. Decompressed distal colon. Unremarkable uterus. Bilateral tubal ligation clips. Distended but otherwise negative urinary bladder. New moderate volume pelvic ascites. There is a left abdominal ostomy with parastomal hernia measuring about 10 cm diameter containing both proximal and distal large bowel as well as associated colonic mesentery and free fluid. Upstream descending colon however is decompressed. Transverse colon has a more normal appearance with retained gas and stool. The hepatic flexure and right colon also contain gas and stool but appear indistinct with mild wall thickening. Negative appendix. Mildly dilated terminal ileum and distal small bowel loops containing air in fluid. Oral contrast distends the stomach and duodenum. Proximal jejunum is decompressed but there are oral contrast containing dilated left abdominal small bowel loops measuring up to 3.5 cm diameter. There does appear to be a somewhat gradual transition to decompressed loops anterior to the upper sacrum on series 2, image 64 (also coronal image 72). No abdominal free air. Small volume ascites, primarily in the right abdomen, is new. No liver mass identified. Decompressed gallbladder. Spleen, pancreas and adrenal glands are within normal limits. Bilateral renal enhancement and contrast excretion within normal limits. Portal venous system is patent. Aortoiliac calcified atherosclerosis noted. Chronic infrarenal abdominal aortic aneurysm measuring up to 42 mm diameter does appear mildly increased from 37-38 mm in 2016. Associated calcified atherosclerosis which continues into the bilateral pelvic and femoral arteries.  Major arterial structures remain patent. No lymphadenopathy identified in the abdomen or pelvis. IMPRESSION: 1. Dilated small bowel suggesting ileus or mechanical obstruction. There is a somewhat gradual transition to decompressed small bowel loops along the anterior pelvic inlet. Favor postoperative adhesions. 2. 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 3. New small volume abdominal and moderate volume pelvic  ascites. Indistinct appearance of the right colon could be reactive, but superimposed colitis would be difficult to exclude. 4. New small loculated right pleural effusion with right lower lobe pneumonia. 5. Chronic but increased infrarenal abdominal aortic aneurysm, with up to 5 mm of growth in less than 12 months may indicate an increased risk of aneurysm rupture. Recommend vascular surgery referral/consultation if not already obtained. 6. No metastatic disease identified in the abdomen or pelvis. 7. Chronic osteopenia and widespread thoracic compression fractures. Electronically Signed   By: Genevie Ann M.D.   On: 02/27/2015 14:55    EKG:  Orders placed or performed during the hospital encounter of 02/26/15  . EKG 12-Lead  . EKG 12-Lead  . ED EKG  . ED EKG    ASSESSMENT AND PLAN:  Active Problems:   COPD exacerbation (HCC)   Acute bronchitis   Abdominal pain, chronic, epigastric   COPD (chronic obstructive pulmonary disease) (HCC)   Hyperglycemia   Chronic pain syndrome  #1 COPD exacerbation due to aspiration pneumonitis, continue patient on nebulizers and inhalers as well as steroids. Patient's chest x-ray was unremarkable. However, patient's CT scan revealed right-sided pneumonia, concerning for aspiration pneumonitis, initiate patient on Unasyn #2. Aspiration pneumonitis, initiate Unasyn #3. Small bowel obstruction, likely due to adhesions, keep patient nothing by mouth,  get surgical consultation, no IV fluids due to ascites and fluid in pleural space, place NG tube. If abdominal pain recurs despite nothing by mouth status,  #4 anemia, get Hemoccult    Management plans discussed with the patient, family and they are in agreement.   DRUG ALLERGIES:  Allergies  Allergen Reactions  . Sulfa Antibiotics Hives    CODE STATUS:     Code Status Orders        Start     Ordered   02/26/15 1744  Full code   Continuous     02/26/15 1743    Code Status History    Date Active Date Inactive Code Status Order ID Comments User Context   10/14/2014 10:59 PM 10/17/2014  4:34 PM Full Code [id number]  Lytle Butte, MD ED   05/17/2014  8:38 PM 05/30/2014  4:55 PM Full Code [id number]  Fritzi Mandes, MD Inpatient    Advance Directive Documentation        Most Recent Value   Type of Advance Directive  Healthcare Power of Attorney   Pre-existing out of facility DNR order (yellow form or pink MOST form)     "MOST" Form in Place?        TOTAL TIME TAKING CARE OF THIS PATIENT: 40 minutes.    Theodoro Grist M.D on 02/27/2015 at 6:05 PM  Between 7am to 6pm - Pager - [phone]  After 6pm go to [email or web address] - password [email or web address] [facility]  Office  [phone]  CC: Primary care physician; [name], NP

## 2015-02-27 NOTE — Consult Note (Signed)
ANTICOAGULATION CONSULT NOTE - follow up Maybee for warfarin Indication: VTE prophylaxis, hx DVT upper extremity  Allergies  Allergen Reactions  . Sulfa Antibiotics Hives    Patient Measurements: Height: '5\' 6"'$  (167.6 cm) Weight: 192 lb (87.091 kg) IBW/kg (Calculated) : 59.3 Heparin Dosing Weight:   Vital Signs: Temp: 97.5 F (36.4 C) (02/21 0545) Temp Source: Oral (02/21 0545) BP: 115/55 mmHg (02/21 0545) Pulse Rate: 81 (02/21 0545)  Labs:  Recent Labs  02/26/15 1229 02/26/15 1230 02/27/15 0725  HGB 10.9*  --  9.7*  HCT 32.4*  --  28.6*  PLT 200  --  172  LABPROT  --  14.7 14.8  INR  --  1.13 1.14  CREATININE 0.80  --  0.84    Estimated Creatinine Clearance: 81.1 mL/min (by C-G formula based on Cr of 0.84).   Medical History: Past Medical History  Diagnosis Date  . Schizophrenia ([place])   . Asthma   . GERD (gastroesophageal reflux disease)   . Anxiety   . Depression   . Bipolar disorder ([place])   . COPD (chronic obstructive pulmonary disease) ([place])   . Occasional tremors     right hand  . PTSD (post-traumatic stress disorder)   . Shortness of breath dyspnea   . Fibromyalgia   . DVT (deep venous thrombosis) ([place]) 2011    RUE  . Thyroid nodule   . DDD (degenerative disc disease), lumbar   . Spinal stenosis   . Peripheral neuropathy ([place])   . Rotator cuff tear     right  . Pneumonia 2011  . Hypothyroidism     no meds currently  . Anemia     during pregnancy only  . Hypertension     Off meds x 15 years-well controlled now per pt  . Squamous cell cancer, anus (H[facility])   . Severe obstructive sleep apnea 06/27/2014  . DVT of upper extremity (deep vein thrombosis) ([place]) 10/14/2014    Medications:  Scheduled:  . ARIPiprazole  2 mg Oral Daily  . baclofen  10 mg Oral TID  . budesonide-formoterol  2 puff Inhalation BID  . calcium-vitamin D  1 tablet Oral BID  . docusate sodium  100 mg Oral Daily  . DULoxetine  60 mg Oral Daily  .  iohexol  25 mL Oral Q1 Hr x 2  . ipratropium-albuterol  3 mL Nebulization Q4H  . methylPREDNISolone (SOLU-MEDROL) injection  60 mg Intravenous 3 times per day  . oxybutynin  5 mg Oral BID  . pantoprazole  40 mg Oral Daily  . pregabalin  200 mg Oral BID  . tiotropium  18 mcg Inhalation Daily  . warfarin  10 mg Oral ONCE-1800  . Warfarin - Pharmacist Dosing Inpatient   Does not apply q1800    Assessment: Pt is a 60 year old female with a recent DVT taking warfarin at home. Home dose per patient is '8mg'$  daily. Pt had held her warfarin from 2/10 to 2/17 to have an edoscopy/colonoscopy but was unable to have the procedure. Pt resumed warfarin on the 17th. Today 2/20 her INR is 1.13.  2/20  INR = 1.13   Warfarin 8 mg 2/21  INR = 1.14  Goal of Therapy:  INR 2-3 Monitor platelets by anticoagulation protocol: Yes   Plan:  Will give one time dose of 10 mg Warfarin. Will recheck INR in the AM. Pharmacy to continue to monitor. Thank you for the consult.  Chinita Greenland PharmD Clinical Pharmacist  02/27/2015 10:28 AM

## 2015-02-27 NOTE — Consult Note (Signed)
PULMONARY / CRITICAL CARE MEDICINE   Name: [name] MRN: [id number] DOB: 1955-04-23    ADMISSION DATE:  02/26/2015 CONSULTATION DATE:  02/27/15 REFERRING MD :  Dr. Benjie Karvonen   CHIEF COMPLAINT:   Shortness of breath and COPD   HISTORY OF PRESENT ILLNESS    60 y.o. female with a known history of asthma/COPD and PTSD who presents with shortness of breath and wheezing after recently seen by primary pulmonologist and treated with clindamycin and doxycycline, along with prednisone. She continues to have shortness of breath and wheezing. In the emergency room she received nebulizer treatments and started IV steroids.  She sees Dr. Alva Garnet an outpatient. Currently on Symbicort and Spiriva her COPD.    SIGNIFICANT EVENTS     PAST MEDICAL HISTORY    :  Past Medical History  Diagnosis Date  . Schizophrenia ([place])   . Asthma   . GERD (gastroesophageal reflux disease)   . Anxiety   . Depression   . Bipolar disorder ([place])   . COPD (chronic obstructive pulmonary disease) ([place])   . Occasional tremors     right hand  . PTSD (post-traumatic stress disorder)   . Shortness of breath dyspnea   . Fibromyalgia   . DVT (deep venous thrombosis) ([place]) 2011    RUE  . Thyroid nodule   . DDD (degenerative disc disease), lumbar   . Spinal stenosis   . Peripheral neuropathy ([place])   . Rotator cuff tear     right  . Pneumonia 2011  . Hypothyroidism     no meds currently  . Anemia     during pregnancy only  . Hypertension     Off meds x 15 years-well controlled now per pt  . Squamous cell cancer, anus (HCC)   . Severe obstructive sleep apnea 06/27/2014  . DVT of upper extremity (deep vein thrombosis) ([place]) 10/14/2014   Past Surgical History  Procedure Laterality Date  . Foot surgery Right   . Tubal ligation    . Eye surgery Bilateral   . Mouth surgery  2002  . Rectal biopsy N/A 05/08/2014    Procedure: BIOPSY RECTAL;  Surgeon: Marlyce Huge, MD;  Location: [facility] ORS;  Service:  General;  Laterality: N/A;  . Evaluation under anesthesia with hemorrhoidectomy N/A 05/08/2014    Procedure: EXAM UNDER ANESTHESIA WITH HEMORRHOIDECTOMY;  Surgeon: Marlyce Huge, MD;  Location: [facility] OR[facility];  Service: General;  Laterality: N/A;  . Portacath placement N/A 05/20/2014    Procedure: INSERTION PORT-A-CATH;  Surgeon: Florene Glen, MD;  Location: [facility] ORS;  Service: General;  Laterality: N/A;  . Laparoscopic diverted colostomy N/A 05/26/2014    Procedure: LAPAROSCOPIC DIVERTED COLOSTOMY;  Surgeon: Marlyce Huge, MD;  Location: [facility] OR[facility];  Service: General;  Laterality: N/A;  . Peripheral vascular catheterization Left 10/16/2014    Procedure: Upper Extremity Venography with thrombectomy, port removal;  Surgeon: Algernon Huxley, MD;  Location: [facility];  Service: Cardiovascular;  Laterality: Left;  . Peripheral vascular catheterization  10/16/2014    Procedure: Upper Extremity Intervention;  Surgeon: Algernon Huxley, MD;  Location: [facility];  Service: Cardiovascular;;   Prior to Admission medications   Medication Sig Start Date End Date Taking? Authorizing Provider  albuterol (PROVENTIL HFA;VENTOLIN HFA) 108 (90 BASE) MCG/ACT inhaler Inhale 2 puffs into the lungs every 6 (six) hours as needed for wheezing or shortness of breath.    Yes Historical Provider, MD  ARIPiprazole (ABILIFY) 2 MG tablet Take 1 tablet (  2 mg total) by mouth daily. 01/31/15  Yes Marjie Skiff, MD  baclofen (LIORESAL) 10 MG tablet Take 1 tablet (10 mg total) by mouth 3 (three) times daily. Patient taking differently: Take 10 mg by mouth 3 (three) times daily as needed for muscle spasms.  02/09/15  Yes [name], NP  budesonide-formoterol (SYMBICORT) 160-4.5 MCG/ACT inhaler Inhale 2 puffs into the lungs 2 (two) times daily. 01/30/15  Yes [name], NP  Calcium Carb-Cholecalciferol (CALCIUM 600+D) 600-800 MG-UNIT TABS Take 1 tablet by mouth 2 (two) times daily.    Yes Historical  Provider, MD  clonazePAM (KLONOPIN) 1 MG tablet Take 1 tablet (1 mg total) by mouth 3 (three) times daily as needed for anxiety. 01/31/15  Yes Marjie Skiff, MD  docusate sodium (COLACE) 100 MG capsule Take 100 mg by mouth daily.   Yes Historical Provider, MD  DULoxetine (CYMBALTA) 60 MG capsule Take 60 mg by mouth at bedtime.   Yes Historical Provider, MD  escitalopram (LEXAPRO) 20 MG tablet Take 1 tablet (20 mg total) by mouth daily. 01/31/15  Yes Marjie Skiff, MD  oxyCODONE-acetaminophen (PERCOCET) 10-325 MG tablet Take 1 tablet by mouth every 6 (six) hours as needed for pain. 02/22/15  Yes Lequita Asal, MD  pantoprazole (PROTONIX) 40 MG tablet Take 40 mg by mouth 2 (two) times daily.    Yes Historical Provider, MD  pregabalin (LYRICA) 200 MG capsule Take 1 capsule (200 mg total) by mouth 2 (two) times daily. 01/23/15  Yes [name], NP  tiotropium (SPIRIVA) 18 MCG inhalation capsule Place 1 capsule (18 mcg total) into inhaler and inhale daily. 02/26/15  Yes [name], NP  warfarin (COUMADIN) 3 MG tablet Take 3 mg by mouth every evening. Pt takes with a '5mg'$  tablet.   Yes Historical Provider, MD  warfarin (COUMADIN) 5 MG tablet Take 5 mg by mouth every evening. Pt takes with a '3mg'$  tablet.   Yes Historical Provider, MD  zolpidem (AMBIEN) 10 MG tablet Take 1 tablet (10 mg total) by mouth at bedtime as needed for sleep. 01/31/15  Yes Marjie Skiff, MD  albuterol (PROVENTIL) (2.5 MG/3ML) 0.083% nebulizer solution Take 3 mLs (2.5 mg total) by nebulization every 4 (four) hours. 02/27/15   Theodoro Grist, MD  bisacodyl (DULCOLAX) 5 MG EC tablet Take 1 tablet (5 mg total) by mouth daily as needed for moderate constipation. 02/27/15   Theodoro Grist, MD  levofloxacin (LEVAQUIN) 750 MG tablet Take 1 tablet (750 mg total) by mouth daily. 02/27/15   Theodoro Grist, MD  oxyCODONE (ROXICODONE) 15 MG immediate release tablet Take 1 tablet (15 mg total) by mouth every 6 (six) hours as needed for severe  pain. 02/27/15   Theodoro Grist, MD  predniSONE (STERAPRED UNI-PAK 21 TAB) 10 MG (21) TBPK tablet Take 1 tablet (10 mg total) by mouth daily. Please take 6 pills in the morning on the day 1 and 2, then taper by one pill every 2 days until finished. Thank you 02/27/15   Theodoro Grist, MD  senna-docusate (SENOKOT-S) 8.6-50 MG tablet Take 1 tablet by mouth at bedtime as needed for mild constipation. 02/27/15   Theodoro Grist, MD   Allergies  Allergen Reactions  . Sulfa Antibiotics Hives     FAMILY HISTORY   Family History  Problem Relation Age of Onset  . Cancer Maternal Aunt   . Cancer Paternal Uncle   . Diabetes Mother   . Thyroid disease Mother   . Kidney failure Mother   .  Hypertension Mother   . Depression Mother   . Mental illness Son   . Aneurysm Maternal Grandmother   . Thyroid disease Maternal Grandmother   . Stroke Maternal Grandfather   . Hypertension Maternal Grandfather   . Diabetes Maternal Grandfather   . Heart disease Maternal Grandfather     MI  . Nephrolithiasis Daughter       SOCIAL HISTORY    reports that she has been smoking Cigarettes.  She started smoking about 44 years ago. She has a 11 pack-year smoking history. She has never used smokeless tobacco. She reports that she uses illicit drugs (Marijuana). She reports that she does not drink alcohol.  Review of Systems  Constitutional: Negative for fever, chills, weight loss and malaise/fatigue.  Eyes: Negative for blurred vision and double vision.  Respiratory: Positive for cough, sputum production, shortness of breath and wheezing. Negative for hemoptysis.   Gastrointestinal: Positive for abdominal pain. Negative for heartburn and nausea.  Genitourinary: Negative for dysuria.  Musculoskeletal: Negative for myalgias.  Skin: Negative for itching.  Endo/Heme/Allergies: Does not bruise/bleed easily.  Psychiatric/Behavioral: Positive for depression.      VITAL SIGNS    Temp:  [97.5 F (36.4 C)-98.7 F  (37.1 C)] 97.5 F (36.4 C) (02/21 0545) Pulse Rate:  [51-109] 81 (02/21 0545) Resp:  [15-27] 18 (02/21 0545) BP: (115-145)/(55-100) 115/55 mmHg (02/21 0545) SpO2:  [92 %-100 %] 93 % (02/21 0803) Weight:  [191 lb (86.637 kg)-192 lb (87.091 kg)] 192 lb (87.091 kg) (02/20 1733) HEMODYNAMICS:   VENTILATOR SETTINGS:   INTAKE / OUTPUT:  Intake/Output Summary (Last 24 hours) at 02/27/15 1059 Last data filed at 02/27/15 0900  Gross per 24 hour  Intake    240 ml  Output    300 ml  Net    -60 ml       PHYSICAL EXAM   Physical Exam  Constitutional: She is oriented to person, place, and time. She appears well-developed.  HENT:  Head: Normocephalic and atraumatic.  Right Ear: External ear normal.  Left Ear: External ear normal.  Mouth/Throat: Oropharynx is clear and moist.  Eyes: Conjunctivae and EOM are normal. Pupils are equal, round, and reactive to light.  Neck: Normal range of motion. Neck supple. No thyromegaly present.  Cardiovascular: Normal rate, regular rhythm, normal heart sounds and intact distal pulses.   No murmur heard. Pulmonary/Chest: Effort normal. No respiratory distress. She has wheezes. She has no rales. She exhibits no tenderness.  Abdominal: Soft. Bowel sounds are normal. She exhibits no distension.  Left stoma in place, draining appropriately   Musculoskeletal: Normal range of motion. She exhibits no edema.  Neurological: She is alert and oriented to person, place, and time.  Skin: Skin is warm and dry. No erythema.  Psychiatric: She has a normal mood and affect.  Nursing note and vitals reviewed.      LABS   LABS:  CBC  Recent Labs Lab 02/26/15 1229 02/27/15 0725  WBC 7.0 5.5  HGB 10.9* 9.7*  HCT 32.4* 28.6*  PLT 200 172   Coag's  Recent Labs Lab 02/26/15 1230 02/27/15 0725  INR 1.13 1.14   BMET  Recent Labs Lab 02/26/15 1229 02/27/15 0725  NA 139 137  K 3.6 4.0  CL 105 106  CO2 28 24  BUN 11 13  CREATININE 0.80 0.84   GLUCOSE 102* 135*   Electrolytes  Recent Labs Lab 02/26/15 1229 02/27/15 0725  CALCIUM 8.5* 8.5*   Sepsis Markers No results for  input(s): LATICACIDVEN, PROCALCITON, O2SATVEN in the last 168 hours. ABG No results for input(s): PHART, PCO2ART, PO2ART in the last 168 hours. Liver Enzymes No results for input(s): AST, ALT, ALKPHOS, BILITOT, ALBUMIN in the last 168 hours. Cardiac Enzymes No results for input(s): TROPONINI, PROBNP in the last 168 hours. Glucose No results for input(s): GLUCAP in the last 168 hours.   No results found for this or any previous visit (from the past 240 hour(s)).   Current facility-administered medications:  .  acetaminophen (TYLENOL) tablet 650 mg, 650 mg, Oral, Q6H PRN **OR** acetaminophen (TYLENOL) suppository 650 mg, 650 mg, Rectal, Q6H PRN, Sital Mody, MD .  albuterol (PROVENTIL) (2.5 MG/3ML) 0.083% nebulizer solution 2.5 mg, 2.5 mg, Nebulization, Q6H PRN, Bettey Costa, MD .  alum & mag hydroxide-simeth (MAALOX/MYLANTA) 200-200-20 MG/5ML suspension 30 mL, 30 mL, Oral, Q6H PRN, Bettey Costa, MD .  ARIPiprazole (ABILIFY) tablet 2 mg, 2 mg, Oral, Daily, Bettey Costa, MD, 2 mg at 02/27/15 0914 .  baclofen (LIORESAL) tablet 10 mg, 10 mg, Oral, TID, Bettey Costa, MD, 10 mg at 02/27/15 0913 .  bisacodyl (DULCOLAX) EC tablet 5 mg, 5 mg, Oral, Daily PRN, Bettey Costa, MD .  budesonide-formoterol (SYMBICORT) 160-4.5 MCG/ACT inhaler 2 puff, 2 puff, Inhalation, BID, Bettey Costa, MD, 2 puff at 02/27/15 0914 .  calcium-vitamin D (OSCAL WITH D) 500-200 MG-UNIT per tablet 1 tablet, 1 tablet, Oral, BID, Bettey Costa, MD, 1 tablet at 02/27/15 0914 .  clonazePAM (KLONOPIN) tablet 1 mg, 1 mg, Oral, TID PRN, Bettey Costa, MD .  docusate sodium (COLACE) capsule 100 mg, 100 mg, Oral, Daily, Bettey Costa, MD, 100 mg at 02/27/15 0913 .  DULoxetine (CYMBALTA) DR capsule 60 mg, 60 mg, Oral, Daily, Bettey Costa, MD, 60 mg at 02/27/15 0913 .  HYDROcodone-acetaminophen (NORCO/VICODIN) 5-325 MG  per tablet 1-2 tablet, 1-2 tablet, Oral, Q4H PRN, Bettey Costa, MD, 2 tablet at 02/26/15 2339 .  iohexol (OMNIPAQUE) 240 MG/ML injection 25 mL, 25 mL, Oral, Q1 Hr x 2, Rima Vaickute, MD .  ipratropium-albuterol (DUONEB) 0.5-2.5 (3) MG/3ML nebulizer solution 3 mL, 3 mL, Nebulization, Q4H, Sital Mody, MD, 3 mL at 02/27/15 0803 .  methylPREDNISolone sodium succinate (SOLU-MEDROL) 125 mg/2 mL injection 60 mg, 60 mg, Intravenous, 3 times per day, Bettey Costa, MD, 60 mg at 02/27/15 0550 .  ondansetron (ZOFRAN) tablet 4 mg, 4 mg, Oral, Q6H PRN **OR** ondansetron (ZOFRAN) injection 4 mg, 4 mg, Intravenous, Q6H PRN, Bettey Costa, MD .  oxybutynin (DITROPAN) tablet 5 mg, 5 mg, Oral, BID, Bettey Costa, MD, 5 mg at 02/26/15 2042 .  oxyCODONE (Oxy IR/ROXICODONE) immediate release tablet 15 mg, 15 mg, Oral, Q6H PRN, Theodoro Grist, MD, 15 mg at 02/27/15 0957 .  pantoprazole (PROTONIX) EC tablet 40 mg, 40 mg, Oral, Daily, Bettey Costa, MD, 40 mg at 02/27/15 0913 .  pregabalin (LYRICA) capsule 200 mg, 200 mg, Oral, BID, Bettey Costa, MD, 200 mg at 02/27/15 0913 .  senna-docusate (Senokot-S) tablet 1 tablet, 1 tablet, Oral, QHS PRN, Bettey Costa, MD .  tiotropium (SPIRIVA) inhalation capsule 18 mcg, 18 mcg, Inhalation, Daily, Bettey Costa, MD, 18 mcg at 02/27/15 0914 .  warfarin (COUMADIN) tablet 10 mg, 10 mg, Oral, ONCE-1800, Bettey Costa, MD .  Warfarin - Pharmacist Dosing Inpatient, , Does not apply, q1800, Melissa D Maccia, RPH .  zolpidem (AMBIEN) tablet 5 mg, 5 mg, Oral, QHS PRN, Bettey Costa, MD, 5 mg at 02/26/15 2339  Facility-Administered Medications Ordered in Other Encounters:  .  heparin  lock flush 100 unit/mL, 500 Units, Intravenous, Once, Lequita Asal, MD .  sodium chloride 0.9 % injection 10 mL, 10 mL, Intravenous, PRN, Lequita Asal, MD  IMAGING    Dg Chest 1 View  02/26/2015  CLINICAL DATA:  60 year old female with shortness of breath EXAM: CHEST 1 VIEW COMPARISON:  Chest CT dated 10/22/2014 FINDINGS:  Single portable view of the chest does not demonstrate a focal consolidation. There is no pleural effusion or pneumothorax. The cardiac silhouette is within normal limits with the osseous structures are grossly unremarkable. IMPRESSION: No acute cardiopulmonary process. Electronically Signed   By: Anner Crete M.D.   On: 02/26/2015 14:41      Indwelling Urinary Catheter continued, requirement due to   Reason to continue Indwelling Urinary Catheter for strict Intake/Output monitoring for hemodynamic instability   Central Line continued, requirement due to   Reason to continue [employer] Monitoring of central venous pressure or other hemodynamic parameters   Ventilator continued, requirement due to, resp failure    Ventilator Sedation RASS 0 to -2   Cultures: BCx2  UC  Sputum  Antibiotics: Levaquin 2/21>> Lines:   ASSESSMENT/PLAN  60 year old female with past medical history of COPD, anxiety, depression, colonic stoma, seen in consultation for COPD exacerbation.  AECOPD -Start Levaquin, treat 10 days total -Patient may need a prolonged also steroid taper, may start at 40 mg and taper over 14 days -Maintain oxygen saturation greater than 88% -Avoid tobacco -May need supplemental oxygen to maintain O2 saturations greater than 88% -Given the frequency of her exacerbations, she could have worsening bronchospasms and overlap syndrome (COPD + suspected OSA), she will benefit from BiPAP or CPAP at night, recommend noninvasive positive pressure ventilation (BiPAP or CPAP) at night while inpatient only. Minimum 4 hours -Continue with DuoNeb's -Continue with Spiriva and Symbicort  Hx of DVT (LUE) Cont with coumadin  Abdominal Pain - CT A/P pending.    I have personally obtained a history, examined the patient, evaluated laboratory and imaging results, formulated the assessment and plan and placed orders.  Pulmonary Consult time devoted to patient care services described in  this note is 45 minutes.    Vilinda Boehringer, MD [facility] Pager [phone] (please enter 7-digits) On Call Pager [phone] (please enter 7-digits)     02/27/2015, 10:59 AM  Note: This note was prepared with Dragon dictation along with smaller phrase technology. Any transcriptional errors that result from this process are unintentional.

## 2015-02-28 ENCOUNTER — Inpatient Hospital Stay: Payer: Commercial Managed Care - HMO

## 2015-02-28 DIAGNOSIS — J9620 Acute and chronic respiratory failure, unspecified whether with hypoxia or hypercapnia: Secondary | ICD-10-CM

## 2015-02-28 DIAGNOSIS — K567 Ileus, unspecified: Secondary | ICD-10-CM

## 2015-02-28 DIAGNOSIS — J439 Emphysema, unspecified: Secondary | ICD-10-CM

## 2015-02-28 DIAGNOSIS — G894 Chronic pain syndrome: Secondary | ICD-10-CM

## 2015-02-28 LAB — PROTIME-INR
INR: 1.39
Prothrombin Time: 17.2 seconds — ABNORMAL HIGH (ref 11.4–15.0)

## 2015-02-28 MED ORDER — WARFARIN SODIUM 4 MG PO TABS: 10.0000 mg | ORAL_TABLET | Freq: Every day | ORAL | Status: DC

## 2015-02-28 MED ORDER — IPRATROPIUM-ALBUTEROL 0.5-2.5 (3) MG/3ML IN SOLN
3.0000 mL | Freq: Four times a day (QID) | RESPIRATORY_TRACT | Status: DC
  Administered 2015-02-28: 3 mL via RESPIRATORY_TRACT
  Filled 2015-02-28: qty 3

## 2015-02-28 MED ORDER — POLYETHYLENE GLYCOL 3350 17 G PO PACK
17.0000 g | PACK | Freq: Every day | ORAL | Status: DC
  Administered 2015-02-28 – 2015-03-03 (×4): 17 g via ORAL
  Filled 2015-02-28 (×4): qty 1

## 2015-02-28 MED ORDER — ALBUTEROL SULFATE (2.5 MG/3ML) 0.083% IN NEBU: 2.5000 mg | INHALATION_SOLUTION | RESPIRATORY_TRACT | Status: DC | PRN

## 2015-02-28 MED ORDER — BISACODYL 10 MG RE SUPP
10.0000 mg | Freq: Once | RECTAL | Status: AC
  Administered 2015-02-28: 18:00:00 10 mg via RECTAL
  Filled 2015-02-28: qty 1

## 2015-02-28 MED ORDER — SODIUM CHLORIDE 0.9 % IV SOLN
INTRAVENOUS | Status: DC
  Administered 2015-02-28 – 2015-03-01 (×2): via INTRAVENOUS

## 2015-02-28 MED ORDER — PREDNISONE 20 MG PO TABS
60.0000 mg | ORAL_TABLET | Freq: Every day | ORAL | Status: DC
  Administered 2015-03-01 – 2015-03-03 (×3): 60 mg via ORAL
  Filled 2015-02-28 (×3): qty 3

## 2015-02-28 MED ORDER — WARFARIN SODIUM 4 MG PO TABS
8.0000 mg | ORAL_TABLET | Freq: Every day | ORAL | Status: DC
  Administered 2015-02-28: 8 mg via ORAL
  Filled 2015-02-28: qty 2

## 2015-02-28 MED ORDER — NICOTINE 21 MG/24HR TD PT24
21.0000 mg | MEDICATED_PATCH | Freq: Every day | TRANSDERMAL | Status: DC
  Administered 2015-02-28 – 2015-03-03 (×4): 21 mg via TRANSDERMAL
  Filled 2015-02-28 (×4): qty 1

## 2015-02-28 MED ORDER — DOXYCYCLINE HYCLATE 100 MG PO TABS
100.0000 mg | ORAL_TABLET | Freq: Two times a day (BID) | ORAL | Status: DC
  Administered 2015-02-28 – 2015-03-03 (×5): 100 mg via ORAL
  Filled 2015-02-28 (×6): qty 1

## 2015-02-28 MED ORDER — NICOTINE POLACRILEX 2 MG MT GUM
2.0000 mg | CHEWING_GUM | OROMUCOSAL | Status: DC | PRN
  Filled 2015-02-28: qty 1

## 2015-02-28 NOTE — Consult Note (Signed)
Surgical Consultation  02/28/2015  [name] is an 60 y.o. female.   CC: Abdominal pain  HPI: This a patient well-known to me who is been admitted for abdominal pain and bloating and has had poor ostomy output. She is a difficult historian but describes blood from her ostomy a month ago and in the last 3 days as had no ostomy output she's not vomiting and has no nausea and has been eating a regular diet in the hospital. She denies fevers or chills and has had no peristomal pain.  Past Medical History  Diagnosis Date  . Schizophrenia ([place])   . Asthma   . GERD (gastroesophageal reflux disease)   . Anxiety   . Depression   . Bipolar disorder ([place])   . COPD (chronic obstructive pulmonary disease) ([place])   . Occasional tremors     right hand  . PTSD (post-traumatic stress disorder)   . Shortness of breath dyspnea   . Fibromyalgia   . DVT (deep venous thrombosis) ([place]) 2011    RUE  . Thyroid nodule   . DDD (degenerative disc disease), lumbar   . Spinal stenosis   . Peripheral neuropathy ([place])   . Rotator cuff tear     right  . Pneumonia 2011  . Hypothyroidism     no meds currently  . Anemia     during pregnancy only  . Hypertension     Off meds x 15 years-well controlled now per pt  . Squamous cell cancer, anus (HCC)   . Severe obstructive sleep apnea 06/27/2014  . DVT of upper extremity (deep vein thrombosis) ([place]) 10/14/2014    Past Surgical History  Procedure Laterality Date  . Foot surgery Right   . Tubal ligation    . Eye surgery Bilateral   . Mouth surgery  2002  . Rectal biopsy N/A 05/08/2014    Procedure: BIOPSY RECTAL;  Surgeon: Marlyce Huge, MD;  Location: [facility] ORS;  Service: General;  Laterality: N/A;  . Evaluation under anesthesia with hemorrhoidectomy N/A 05/08/2014    Procedure: EXAM UNDER ANESTHESIA WITH HEMORRHOIDECTOMY;  Surgeon: Marlyce Huge, MD;  Location: [facility] OR[facility];  Service: General;  Laterality: N/A;  . Portacath placement N/A  05/20/2014    Procedure: INSERTION PORT-A-CATH;  Surgeon: Florene Glen, MD;  Location: [facility];  Service: General;  Laterality: N/A;  . Laparoscopic diverted colostomy N/A 05/26/2014    Procedure: LAPAROSCOPIC DIVERTED COLOSTOMY;  Surgeon: Marlyce Huge, MD;  Location: [facility];  Service: General;  Laterality: N/A;  . Peripheral vascular catheterization Left 10/16/2014    Procedure: Upper Extremity Venography with thrombectomy, port removal;  Surgeon: Algernon Huxley, MD;  Location: [facility];  Service: Cardiovascular;  Laterality: Left;  . Peripheral vascular catheterization  10/16/2014    Procedure: Upper Extremity Intervention;  Surgeon: Algernon Huxley, MD;  Location: [facility];  Service: Cardiovascular;;    Family History  Problem Relation Age of Onset  . Cancer Maternal Aunt   . Cancer Paternal Uncle   . Diabetes Mother   . Thyroid disease Mother   . Kidney failure Mother   . Hypertension Mother   . Depression Mother   . Mental illness Son   . Aneurysm Maternal Grandmother   . Thyroid disease Maternal Grandmother   . Stroke Maternal Grandfather   . Hypertension Maternal Grandfather   . Diabetes Maternal Grandfather   . Heart disease Maternal Grandfather     MI  . Nephrolithiasis Daughter  Social History:  reports that she has been smoking Cigarettes.  She started smoking about 44 years ago. She has a 11 pack-year smoking history. She has never used smokeless tobacco. She reports that she uses illicit drugs (Marijuana). She reports that she does not drink alcohol.  Allergies:  Allergies  Allergen Reactions  . Sulfa Antibiotics Hives    Medications reviewed.   Review of Systems:   Review of Systems  Constitutional: Negative.   HENT: Negative.   Eyes: Negative.   Respiratory: Negative.   Cardiovascular: Negative.   Gastrointestinal: Positive for abdominal pain, constipation and blood in stool. Negative for heartburn, nausea, vomiting,  diarrhea and melena.  Genitourinary: Negative.   Musculoskeletal: Negative.   Skin: Negative.   Neurological: Negative.   Endo/Heme/Allergies: Negative.   Psychiatric/Behavioral: Negative.      Physical Exam:  BP 114/78 mmHg  Pulse 76  Temp(Src) 97.8 F (36.6 C) (Oral)  Resp 19  Ht 5' 6" (1.676 m)  Wt 192 lb (87.091 kg)  BMI 31.00 kg/m2  SpO2 93%  Physical Exam  Constitutional: She is oriented to person, place, and time and well-developed, well-nourished, and in no distress. No distress.  HENT:  Head: Normocephalic and atraumatic.  Eyes: Pupils are equal, round, and reactive to light. Right eye exhibits no discharge. Left eye exhibits no discharge. No scleral icterus.  Neck: Normal range of motion.  Cardiovascular: Normal rate, regular rhythm and normal heart sounds.   Pulmonary/Chest: Effort normal and breath sounds normal. No respiratory distress. She has no wheezes. She has no rales.  Abdominal: Soft. She exhibits no distension. There is no tenderness. There is no rebound and no guarding.  Suggestion of peristomal hernia which is soft and nontender ostomy is pink and viable but no stool in the bag there is gas in the bag however  Musculoskeletal: Normal range of motion. She exhibits no edema.  Lymphadenopathy:    She has no cervical adenopathy.  Neurological: She is alert and oriented to person, place, and time.  Skin: Skin is warm and dry. She is not diaphoretic. No erythema.  Psychiatric: Mood and affect normal.      Results for orders placed or performed during the hospital encounter of 02/26/15 (from the past 48 hour(s))  CBC     Status: Abnormal   Collection Time: 02/27/15  7:25 AM  Result Value Ref Range   WBC 5.5 3.6 - 11.0 K/uL   RBC 3.04 (L) 3.80 - 5.20 MIL/uL   Hemoglobin 9.7 (L) 12.0 - 16.0 g/dL   HCT 28.6 (L) 35.0 - 47.0 %   MCV 93.9 80.0 - 100.0 fL   MCH 31.7 26.0 - 34.0 pg   MCHC 33.8 32.0 - 36.0 g/dL   RDW 15.9 (H) 11.5 - 14.5 %   Platelets 172  150 - 440 K/uL  Basic metabolic panel     Status: Abnormal   Collection Time: 02/27/15  7:25 AM  Result Value Ref Range   Sodium 137 135 - 145 mmol/L   Potassium 4.0 3.5 - 5.1 mmol/L   Chloride 106 101 - 111 mmol/L   CO2 24 22 - 32 mmol/L   Glucose, Bld 135 (H) 65 - 99 mg/dL   BUN 13 6 - 20 mg/dL   Creatinine, Ser 0.84 0.44 - 1.00 mg/dL   Calcium 8.5 (L) 8.9 - 10.3 mg/dL   GFR calc non Af Amer >60 >60 mL/min   GFR calc Af Amer >60 >60 mL/min    Comment: (NOTE)  The eGFR has been calculated using the CKD EPI equation. This calculation has not been validated in all clinical situations. eGFR's persistently <60 mL/min signify possible Chronic Kidney Disease.    Anion gap 7 5 - 15  Protime-INR     Status: None   Collection Time: 02/27/15  7:25 AM  Result Value Ref Range   Prothrombin Time 14.8 11.4 - 15.0 seconds   INR 1.14   Hemoglobin A1c     Status: None   Collection Time: 02/27/15  7:25 AM  Result Value Ref Range   Hgb A1c MFr Bld 4.7 4.0 - 6.0 %  Protime-INR     Status: Abnormal   Collection Time: 02/28/15  4:36 AM  Result Value Ref Range   Prothrombin Time 17.2 (H) 11.4 - 15.0 seconds   INR 1.39    Dg Abd 1 View  02/28/2015  CLINICAL DATA:  No bowel movement in several weeks, colostomy since 2016 due to anal carcinoma EXAM: ABDOMEN - 1 VIEW COMPARISON:  06/02/2014, 02/27/2015 FINDINGS: Oral contrast has not reached the transverse colon. There are numerous mildly dilated loops of small bowel in the mid abdomen and left abdomen measuring up to 3.5 cm. IV contrast is within the bladder. IMPRESSION: Dilated small bowel loops are concerning for mild incomplete small bowel obstruction. Contrast has reached the transverse colon. Electronically Signed   By: Skipper Cliche M.D.   On: 02/28/2015 10:09   Ct Abdomen Pelvis W Contrast  02/27/2015  CLINICAL DATA:  60 year old female with epigastric abdominal pain. Initial encounter. Personal history of anal cancer. EXAM: CT ABDOMEN AND  PELVIS WITH CONTRAST TECHNIQUE: Multidetector CT imaging of the abdomen and pelvis was performed using the standard protocol following bolus administration of intravenous contrast. CONTRAST:  117m OMNIPAQUE IOHEXOL 300 MG/ML  SOLN COMPARISON:  Portable chest 02/26/2015. Chest CTA 10/22/2014. Restaging chest abdomen and pelvis CT 05/19/2014. FINDINGS: New mildly lobulated, partially loculated appearing small right pleural effusion. Adjacent right lower lobe consolidation with air bronchograms (series 5, image 6). Mild left costophrenic angle opacity more resembles atelectasis. No lung base nodule identified. No left pleural effusion or pericardial effusion. Widespread osteopenia and lower thoracic compression fractures appear stable since 2016. Chronic right mid sacral fracture. No acute or suspicious osseous lesion identified. No residual rectal or anal verge soft tissue mass. Presacral stranding may reflect sequelae of radiation. Decompressed distal colon. Unremarkable uterus. Bilateral tubal ligation clips. Distended but otherwise negative urinary bladder. New moderate volume pelvic ascites. There is a left abdominal ostomy with parastomal hernia measuring about 10 cm diameter containing both proximal and distal large bowel as well as associated colonic mesentery and free fluid. Upstream descending colon however is decompressed. Transverse colon has a more normal appearance with retained gas and stool. The hepatic flexure and right colon also contain gas and stool but appear indistinct with mild wall thickening. Negative appendix. Mildly dilated terminal ileum and distal small bowel loops containing air in fluid. Oral contrast distends the stomach and duodenum. Proximal jejunum is decompressed but there are oral contrast containing dilated left abdominal small bowel loops measuring up to 3.5 cm diameter. There does appear to be a somewhat gradual transition to decompressed loops anterior to the upper sacrum on  series 2, image 64 (also coronal image 72). No abdominal free air. Small volume ascites, primarily in the right abdomen, is new. No liver mass identified. Decompressed gallbladder. Spleen, pancreas and adrenal glands are within normal limits. Bilateral renal enhancement and contrast excretion within normal  limits. Portal venous system is patent. Aortoiliac calcified atherosclerosis noted. Chronic infrarenal abdominal aortic aneurysm measuring up to 42 mm diameter does appear mildly increased from 37-38 mm in 2016. Associated calcified atherosclerosis which continues into the bilateral pelvic and femoral arteries. Major arterial structures remain patent. No lymphadenopathy identified in the abdomen or pelvis. IMPRESSION: 1. Dilated small bowel suggesting ileus or mechanical obstruction. There is a somewhat gradual transition to decompressed small bowel loops along the anterior pelvic inlet. Favor postoperative adhesions. 2. 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 3. New small volume abdominal and moderate volume pelvic ascites. Indistinct appearance of the right colon could be reactive, but superimposed colitis would be difficult to exclude. 4. New small loculated right pleural effusion with right lower lobe pneumonia. 5. Chronic but increased infrarenal abdominal aortic aneurysm, with up to 5 mm of growth in less than 12 months may indicate an increased risk of aneurysm rupture. Recommend vascular surgery referral/consultation if not already obtained. 6. No metastatic disease identified in the abdomen or pelvis. 7. Chronic osteopenia and widespread thoracic compression fractures. Electronically Signed   By: Genevie Ann M.D.   On: 02/27/2015 14:55    Assessment/Plan:  Is a patient with a soft peristomal hernia and a suggestion of bowel obstruction although the contrast from the CT scan is moved all  the way over to the left side. Peristomal hernia may be the process causing this patient's problem but I will try Dulcolax suppository first as she is not acutely ill's peristomal hernia may require repair or relocation.  Florene Glen, MD, FACS

## 2015-02-28 NOTE — Progress Notes (Signed)
[facility] [facility] [place] NAME: Keianna Signer    MR#:  [id number]  DATE OF BIRTH:  02-22-1955  SUBJECTIVE:  CHIEF COMPLAINT:   Chief Complaint  Patient presents with  . Shortness of Breath   patient is 60 year old Caucasian female with past medical history significant for a history of COPD, asthma, PTSD,  squamous cell cancer of anus, status post colostomy who presents to the hospital with complaints of shortness of breath and wheezing, she was admitted for COPD exacerbation and feels improved now. While in the hospital, she complained of epigastric abdominal pain. CT scan of abdomen and pelvis revealed dilated small bowel loops suggesting ileus or obstruction, left lower quadrant ostomy, pelvic ascites, right pleural effusion, right lower lobe pneumonia. No metastasis was noted in abdomen and pelvis.  Patient feels better today in regards to the single shortness of breath, she is still nothing by mouth, no stool in colostomy pouch.  MiraLAX was tried, however, still no stool  Review of Systems  Constitutional: Negative for fever, chills and weight loss.  HENT: Negative for congestion.   Eyes: Negative for blurred vision and double vision.  Respiratory: Positive for cough, shortness of breath and wheezing. Negative for sputum production.   Cardiovascular: Negative for chest pain, palpitations, orthopnea, leg swelling and PND.  Gastrointestinal: Positive for abdominal pain. Negative for nausea, vomiting, diarrhea, constipation and blood in stool.  Genitourinary: Negative for dysuria, urgency, frequency and hematuria.  Musculoskeletal: Negative for falls.  Neurological: Negative for dizziness, tremors, focal weakness and headaches.  Endo/Heme/Allergies: Does not bruise/bleed easily.  Psychiatric/Behavioral: Negative for depression. The patient does not have insomnia.     VITAL SIGNS: Blood pressure 114/78, pulse 76, temperature 97.8 F (36.6  C), temperature source Oral, resp. rate 19, height '5\' 6"'$  (1.676 m), weight 87.091 kg (192 lb), SpO2 93 %.  PHYSICAL EXAMINATION:   GENERAL:  60 y.o.-year-old patient lying in the bed with no acute distress.  EYES: Pupils equal, round, reactive to light and accommodation. No scleral icterus. Extraocular muscles intact.  HEENT: Head atraumatic, normocephalic. Oropharynx and nasopharynx clear.  NECK:  Supple, no jugular venous distention. No thyroid enlargement, no tenderness.  LUNGS: Better Air entrance bilaterally at bases, scattered wheezing, few rales,rhonchi , but no crepitations. No use of accessory muscles of respiration.  CARDIOVASCULAR: S1, S2 normal. No murmurs, rubs, or gallops.  ABDOMEN: Soft, mildly tender in epigastric area,, mild distention. Bowel sounds present. No organomegaly or mass. Colostomy pouch is  empty EXTREMITIES: No pedal edema, cyanosis, or clubbing.  NEUROLOGIC: Cranial nerves II through XII are intact. Muscle strength 5/5 in all extremities. Sensation intact. Gait not checked.  PSYCHIATRIC: The patient is alert and oriented x 3.  SKIN: No obvious rash, lesion, or ulcer.   ORDERS/RESULTS REVIEWED:   CBC  Recent Labs Lab 02/26/15 1229 02/27/15 0725  WBC 7.0 5.5  HGB 10.9* 9.7*  HCT 32.4* 28.6*  PLT 200 172  MCV 92.7 93.9  MCH 31.2 31.7  MCHC 33.6 33.8  RDW 16.0* 15.9*   ------------------------------------------------------------------------------------------------------------------  Chemistries   Recent Labs Lab 02/26/15 1229 02/27/15 0725  NA 139 137  K 3.6 4.0  CL 105 106  CO2 28 24  GLUCOSE 102* 135*  BUN 11 13  CREATININE 0.80 0.84  CALCIUM 8.5* 8.5*   ------------------------------------------------------------------------------------------------------------------ estimated creatinine clearance is 81.1 mL/min (by C-G formula based on Cr of  0.84). ------------------------------------------------------------------------------------------------------------------ No results for input(s): TSH, T4TOTAL, T3FREE, THYROIDAB in the  last 72 hours.  Invalid input(s): FREET3  Cardiac Enzymes No results for input(s): CKMB, TROPONINI, MYOGLOBIN in the last 168 hours.  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 There are numerous mildly dilated loops of small bowel in the mid abdomen and left abdomen measuring up to 3.5 cm. IV contrast is within the bladder. IMPRESSION: Dilated small bowel loops are concerning for mild incomplete small bowel obstruction. Contrast has reached the transverse colon. Electronically Signed   By: Skipper Cliche M.D.   On: 02/28/2015 10:09   Ct Abdomen Pelvis W Contrast  02/27/2015  CLINICAL DATA:  60 year old female with epigastric abdominal pain. Initial encounter. Personal history of anal cancer. EXAM: CT ABDOMEN AND PELVIS WITH CONTRAST TECHNIQUE: Multidetector CT imaging of the abdomen and pelvis was performed using the standard protocol following bolus administration of intravenous contrast. CONTRAST:  164m OMNIPAQUE IOHEXOL 300 MG/ML  SOLN COMPARISON:  Portable chest 02/26/2015. Chest CTA 10/22/2014. Restaging chest abdomen and pelvis CT 05/19/2014. FINDINGS: New mildly lobulated, partially loculated appearing small right pleural effusion. Adjacent right lower lobe consolidation with air bronchograms (series 5, image 6). Mild left  costophrenic angle opacity more resembles atelectasis. No lung base nodule identified. No left pleural effusion or pericardial effusion. Widespread osteopenia and lower thoracic compression fractures appear stable since 2016. Chronic right mid sacral fracture. No acute or suspicious osseous lesion identified. No residual rectal or anal verge soft tissue mass. Presacral stranding may reflect sequelae of radiation. Decompressed distal colon. Unremarkable uterus. Bilateral tubal ligation clips. Distended but otherwise negative urinary bladder. New moderate volume pelvic ascites. There is a left abdominal ostomy with parastomal hernia measuring about 10 cm diameter containing both proximal and distal large bowel as well as associated colonic mesentery and free fluid. Upstream descending colon however is decompressed. Transverse colon has a more normal appearance with retained gas and stool. The hepatic flexure and right colon also contain gas and stool but appear indistinct with mild wall thickening. Negative appendix. Mildly dilated terminal ileum and distal small bowel loops containing air in fluid. Oral contrast distends the stomach and duodenum. Proximal jejunum is decompressed but there are oral contrast containing dilated left abdominal small bowel loops measuring up to 3.5 cm diameter. There does appear to be a somewhat gradual transition to decompressed loops anterior to the upper sacrum on series 2, image 64 (also coronal image 72). No abdominal free air. Small volume ascites, primarily in the right abdomen, is new. No liver mass identified. Decompressed gallbladder. Spleen, pancreas and adrenal glands are within normal limits. Bilateral renal enhancement and contrast excretion within normal limits. Portal venous system is patent. Aortoiliac calcified atherosclerosis noted. Chronic infrarenal abdominal aortic aneurysm measuring up to 42 mm diameter does appear mildly increased from 37-38 mm in 2016. Associated  calcified atherosclerosis which continues into the bilateral pelvic and femoral arteries. Major arterial structures remain patent. No lymphadenopathy identified in the abdomen or pelvis. IMPRESSION: 1. Dilated small bowel suggesting ileus or mechanical obstruction. There is a somewhat gradual transition to decompressed small bowel loops along the anterior pelvic inlet. Favor postoperative adhesions. 2. 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 3. New small  volume abdominal and moderate volume pelvic ascites. Indistinct appearance of the right colon could be reactive, but superimposed colitis would be difficult to exclude. 4. New small loculated right pleural effusion with right lower lobe pneumonia. 5. Chronic but increased infrarenal abdominal aortic aneurysm, with up to 5 mm of growth in less than 12 months may indicate an increased risk of aneurysm rupture. Recommend vascular surgery referral/consultation if not already obtained. 6. No metastatic disease identified in the abdomen or pelvis. 7. Chronic osteopenia and widespread thoracic compression fractures. Electronically Signed   By: Genevie Ann M.D.   On: 02/27/2015 14:55    EKG:  Orders placed or performed during the hospital encounter of 02/26/15  . EKG 12-Lead  . EKG 12-Lead  . ED EKG  . ED EKG    ASSESSMENT AND PLAN:  Active Problems:   COPD exacerbation (HCC)   Acute bronchitis   Abdominal pain, chronic, epigastric   COPD (chronic obstructive pulmonary disease) (HCC)   Hyperglycemia   Chronic pain syndrome  #1 COPD exacerbation due to aspiration pneumonitis, continue patient on nebulizers and inhalers as well as steroids. Patient's chest x-ray was unremarkable. However, patient's CT scan revealed right-sided pneumonia, concerning for aspiration pneumonitis, continue Unasyn for now, change to Augmentin when able to take  orally #2. Aspiration pneumonitis, continue Unasyn,change to Augmentin when able to take orally #3. Small bowel obstruction, ?due to adhesions versus pseudoobstruction due to constipation, continue patient nothing by mouth, awaiting for surgical input, initiate low rate IV fluids, watch for worsening ascites and fluid collection in pleural space.   #4 anemia, get Hemoccult  #5. Constipation, continue patient on docusate, MiraLAX, no suppositories or enemas  were tried due to ostomy, awaiting for surgical input   Management plans discussed with the patient, family and they are in agreement.   DRUG ALLERGIES:  Allergies  Allergen Reactions  . Sulfa Antibiotics Hives    CODE STATUS:     Code Status Orders        Start     Ordered   02/26/15 1744  Full code   Continuous     02/26/15 1743    Code Status History    Date Active Date Inactive Code Status Order ID Comments User Context   10/14/2014 10:59 PM 10/17/2014  4:34 PM Full Code [id number]  Lytle Butte, MD ED   05/17/2014  8:38 PM 05/30/2014  4:55 PM Full Code [id number]  Fritzi Mandes, MD Inpatient    Advance Directive Documentation        Most Recent Value   Type of Advance Directive  Healthcare Power of Attorney   Pre-existing out of facility DNR order (yellow form or pink MOST form)     "MOST" Form in Place?        TOTAL TIME TAKING CARE OF THIS PATIENT: 35 minutes.    Theodoro Grist M.D on 02/28/2015 at 4:15 PM  Between 7am to 6pm - Pager - [phone]  After 6pm go to [email or web address] - password [email or web address] [facility]  Office  [phone]  CC: Primary care physician; [name], NP

## 2015-02-28 NOTE — Care Management Important Message (Signed)
Important Message  Patient Details  Name: [name] MRN: [id number] Date of Birth: 1955-12-24   Medicare Important Message Given:  Yes    Juliann Pulse A Jannessa Ogden 02/28/2015, 11:00 AM

## 2015-02-28 NOTE — Care Management (Signed)
Admitted to [facility] with the diagnosis of COPD. Lives with the family Quita Skye and Hurley Cisco for the last 2.5 months. Address: [address] Telephone: [phone]. Daughters are April [phone]) and Stanton Kidney [phone]). Last seen [name] 02/06/15. [facility] in May 2016. No skilled facility. Uses a shower chair in the home. Uses a rolling walker to aid in ambulation. Last BM on Sunday. Takes care of all basic activities of daily living herself. Ms. Henrene Pastor helps with the errands. Goes to the [employer] on Sunday for meals. [employer]). No falls. Appetite good/bad. Ms. Henrene Pastor will transport. Shelbie Ammons RN MSN CCM Care Management [phone]

## 2015-02-28 NOTE — Consult Note (Signed)
ANTICOAGULATION CONSULT NOTE - follow up [place] for warfarin Indication: VTE prophylaxis, hx DVT upper extremity  Allergies  Allergen Reactions  . Sulfa Antibiotics Hives    Patient Measurements: Height: '5\' 6"'$  (167.6 cm) Weight: 192 lb (87.091 kg) IBW/kg (Calculated) : 59.3 Heparin Dosing Weight:   Vital Signs: Temp: 97.5 F (36.4 C) (02/22 0507) Temp Source: Oral (02/22 0507) BP: 110/49 mmHg (02/22 0507) Pulse Rate: 84 (02/22 0507)  Labs:  Recent Labs  02/26/15 1229 02/26/15 1230 02/27/15 0725 02/28/15 0436  HGB 10.9*  --  9.7*  --   HCT 32.4*  --  28.6*  --   PLT 200  --  172  --   LABPROT  --  14.7 14.8 17.2*  INR  --  1.13 1.14 1.39  CREATININE 0.80  --  0.84  --     Estimated Creatinine Clearance: 81.1 mL/min (by C-G formula based on Cr of 0.84).   Medical History: Past Medical History  Diagnosis Date  . Schizophrenia ([place])   . Asthma   . GERD (gastroesophageal reflux disease)   . Anxiety   . Depression   . Bipolar disorder ([place])   . COPD (chronic obstructive pulmonary disease) ([place])   . Occasional tremors     right hand  . PTSD (post-traumatic stress disorder)   . Shortness of breath dyspnea   . Fibromyalgia   . DVT (deep venous thrombosis) ([place]) 2011    RUE  . Thyroid nodule   . DDD (degenerative disc disease), lumbar   . Spinal stenosis   . Peripheral neuropathy ([place])   . Rotator cuff tear     right  . Pneumonia 2011  . Hypothyroidism     no meds currently  . Anemia     during pregnancy only  . Hypertension     Off meds x 15 years-well controlled now per pt  . Squamous cell cancer, anus (HCC)   . Severe obstructive sleep apnea 06/27/2014  . DVT of upper extremity (deep vein thrombosis) ([place]) 10/14/2014    Medications:  Scheduled:  . ampicillin-sulbactam (UNASYN) IV  3 g Intravenous Q8H  . ARIPiprazole  2 mg Oral Daily  . baclofen  10 mg Oral TID  . budesonide-formoterol  2 puff Inhalation BID  .  calcium-vitamin D  1 tablet Oral BID  . docusate sodium  100 mg Oral Daily  . DULoxetine  60 mg Oral Daily  . ipratropium-albuterol  3 mL Nebulization Q4H  . methylPREDNISolone (SOLU-MEDROL) injection  60 mg Intravenous 3 times per day  . oxybutynin  5 mg Oral BID  . pantoprazole  40 mg Oral Daily  . polyethylene glycol  17 g Oral Daily  . pregabalin  200 mg Oral BID  . tiotropium  18 mcg Inhalation Daily  . warfarin  10 mg Oral q1800  . Warfarin - Pharmacist Dosing Inpatient   Does not apply q1800    Assessment: Pt is a 60 year old female with a recent DVT taking warfarin at home. Home dose per patient is '8mg'$  daily. Pt had held her warfarin from 2/10 to 2/17 to have an edoscopy/colonoscopy but was unable to have the procedure. Pt resumed warfarin on the 17th. Today 2/20 her INR is 1.13.  2/20  INR = 1.13   Warfarin 8 mg 2/21  INR = 1.14   Warfarin 10 mg 2/22  INR = 1.39  Goal of Therapy:  INR 2-3 Monitor platelets by anticoagulation protocol: Yes  Plan:  INR remains subtherapeutic but increasing. Will continue with warfarin 8 mg daily as warfarin was held for several days and f/u AM INR.   Ulice Dash, PharmD Clinical Pharmacist   02/28/2015 8:53 AM

## 2015-02-28 NOTE — Plan of Care (Signed)
Problem: Bowel/Gastric: Goal: Will not experience complications related to bowel motility Outcome: Not Progressing No BM via colostomy yet. General surgery consult pending.

## 2015-02-28 NOTE — Progress Notes (Signed)
Much improved respiratory status. CTAP findings suggesting SBO noted. No new complaints  Filed Vitals:   02/28/15 0507 02/28/15 0837 02/28/15 1328 02/28/15 1446  BP: 110/49  114/78   Pulse: 84  76   Temp: 97.5 F (36.4 C)  97.8 F (36.6 C)   TempSrc: Oral  Oral   Resp: 18  19   Height:      Weight:      SpO2: 90% 91% 90% 93%    Gen: NAD HEENT: WNL Neck: NO LAN, no JVD noted Lungs: diminished BS, few scattered wheezes Cardiovascular: Reg, no M noted Abdomen: Soft, NT +BS Ext: no C/C/E   BMP Latest Ref Rng 02/27/2015 02/26/2015 12/21/2014  Glucose 65 - 99 mg/dL 135(H) 102(H) 75  BUN 6 - 20 mg/dL '13 11 10  '$ Creatinine 0.44 - 1.00 mg/dL 0.84 0.80 0.72  Sodium 135 - 145 mmol/L 137 139 136  Potassium 3.5 - 5.1 mmol/L 4.0 3.6 3.3(L)  Chloride 101 - 111 mmol/L 106 105 102  CO2 22 - 32 mmol/L '24 28 26  '$ Calcium 8.9 - 10.3 mg/dL 8.5(L) 8.5(L) 8.8(L)    CBC Latest Ref Rng 02/27/2015 02/26/2015 01/23/2015  WBC 3.6 - 11.0 K/uL 5.5 7.0 5.5  Hemoglobin 12.0 - 16.0 g/dL 9.7(L) 10.9(L) 11.8(L)  Hematocrit 35.0 - 47.0 % 28.6(L) 32.4(L) 35.3  Platelets 150 - 440 K/uL 172 200 176    CXR (02/26/15): [facility]  IMPRESSION: Acute hypoxic respiratory failure due to AECOPD, much improved Smoker - she is adamant that she will not smoke again  PLAN/REC: Cont Symbicort (Dulera while in hospital) and Spiriva Cont PRN albuterol Change steroids to prednisone and taper to off over 5-7 days I counseled re: need for abstinence from smoking - whatever it takes She has follow up with me in early March  PCCM will sign off. Please call if we can be of further assistance  Merton Border, MD PCCM service Mobile [phone] Pager [phone]

## 2015-03-01 ENCOUNTER — Ambulatory Visit: Payer: Commercial Managed Care - HMO

## 2015-03-01 ENCOUNTER — Inpatient Hospital Stay: Payer: Commercial Managed Care - HMO

## 2015-03-01 ENCOUNTER — Other Ambulatory Visit: Payer: Self-pay | Admitting: Hematology and Oncology

## 2015-03-01 ENCOUNTER — Inpatient Hospital Stay: Payer: Commercial Managed Care - HMO | Admitting: Hematology and Oncology

## 2015-03-01 DIAGNOSIS — E538 Deficiency of other specified B group vitamins: Secondary | ICD-10-CM

## 2015-03-01 DIAGNOSIS — K435 Parastomal hernia without obstruction or  gangrene: Secondary | ICD-10-CM

## 2015-03-01 DIAGNOSIS — M5136 Other intervertebral disc degeneration, lumbar region: Secondary | ICD-10-CM

## 2015-03-01 DIAGNOSIS — D649 Anemia, unspecified: Secondary | ICD-10-CM | POA: Insufficient documentation

## 2015-03-01 DIAGNOSIS — F319 Bipolar disorder, unspecified: Secondary | ICD-10-CM

## 2015-03-01 DIAGNOSIS — G629 Polyneuropathy, unspecified: Secondary | ICD-10-CM

## 2015-03-01 DIAGNOSIS — G4730 Sleep apnea, unspecified: Secondary | ICD-10-CM

## 2015-03-01 DIAGNOSIS — Z809 Family history of malignant neoplasm, unspecified: Secondary | ICD-10-CM

## 2015-03-01 DIAGNOSIS — E041 Nontoxic single thyroid nodule: Secondary | ICD-10-CM

## 2015-03-01 DIAGNOSIS — M4800 Spinal stenosis, site unspecified: Secondary | ICD-10-CM

## 2015-03-01 DIAGNOSIS — F209 Schizophrenia, unspecified: Secondary | ICD-10-CM

## 2015-03-01 DIAGNOSIS — F418 Other specified anxiety disorders: Secondary | ICD-10-CM

## 2015-03-01 DIAGNOSIS — Z85048 Personal history of other malignant neoplasm of rectum, rectosigmoid junction, and anus: Secondary | ICD-10-CM

## 2015-03-01 DIAGNOSIS — K219 Gastro-esophageal reflux disease without esophagitis: Secondary | ICD-10-CM

## 2015-03-01 DIAGNOSIS — K5660 Unspecified intestinal obstruction: Secondary | ICD-10-CM

## 2015-03-01 DIAGNOSIS — M797 Fibromyalgia: Secondary | ICD-10-CM

## 2015-03-01 DIAGNOSIS — F4310 Post-traumatic stress disorder, unspecified: Secondary | ICD-10-CM

## 2015-03-01 DIAGNOSIS — Z9221 Personal history of antineoplastic chemotherapy: Secondary | ICD-10-CM

## 2015-03-01 DIAGNOSIS — Z86718 Personal history of other venous thrombosis and embolism: Secondary | ICD-10-CM

## 2015-03-01 DIAGNOSIS — K5669 Other intestinal obstruction: Secondary | ICD-10-CM

## 2015-03-01 DIAGNOSIS — J189 Pneumonia, unspecified organism: Secondary | ICD-10-CM

## 2015-03-01 DIAGNOSIS — F17210 Nicotine dependence, cigarettes, uncomplicated: Secondary | ICD-10-CM

## 2015-03-01 DIAGNOSIS — R188 Other ascites: Secondary | ICD-10-CM

## 2015-03-01 DIAGNOSIS — E039 Hypothyroidism, unspecified: Secondary | ICD-10-CM

## 2015-03-01 LAB — PROTIME-INR
INR: 1.49
Prothrombin Time: 18.1 seconds — ABNORMAL HIGH (ref 11.4–15.0)

## 2015-03-01 MED ORDER — BISACODYL 10 MG RE SUPP
10.0000 mg | Freq: Once | RECTAL | Status: AC
  Administered 2015-03-01: 10 mg via RECTAL
  Filled 2015-03-01: qty 1

## 2015-03-01 MED ORDER — WARFARIN SODIUM 4 MG PO TABS
8.0000 mg | ORAL_TABLET | Freq: Every day | ORAL | Status: DC
  Administered 2015-03-01: 8 mg via ORAL
  Filled 2015-03-01: qty 2

## 2015-03-01 MED ORDER — WARFARIN SODIUM 4 MG PO TABS
9.0000 mg | ORAL_TABLET | Freq: Once | ORAL | Status: DC
  Filled 2015-03-01: qty 1

## 2015-03-01 MED ORDER — MAGNESIUM HYDROXIDE 400 MG/5ML PO SUSP
30.0000 mL | Freq: Once | ORAL | Status: AC
  Administered 2015-03-01: 16:00:00 30 mL via ORAL
  Filled 2015-03-01: qty 30

## 2015-03-01 NOTE — Progress Notes (Signed)
CC: Poor ostomy output Subjective: She has no abdominal pain no nausea or vomiting but has not had any output from her ostomy and several days she has had no improvement with a Dulcolax suppository.  Objective: Vital signs in last 24 hours: Temp:  [97.7 F (36.5 C)-98 F (36.7 C)] 98 F (36.7 C) (02/23 0539) Pulse Rate:  [70-86] 70 (02/23 1315) Resp:  [18] 18 (02/23 1315) BP: (113-117)/(61-80) 117/80 mmHg (02/23 1315) SpO2:  [92 %-94 %] 93 % (02/23 1315) Last BM Date: 02/25/15  Intake/Output from previous day: 02/22 0701 - 02/23 0700 In: 600 [I.V.:600] Out: 900 [Urine:900] Intake/Output this shift:    Physical exam:  Abdomen is soft nondistended nontympanitic nontender no gas in bag removed and ostomy is digitalized. Peristomal areas soft nontender without mass.  Lab Results: CBC   Recent Labs  02/27/15 0725  WBC 5.5  HGB 9.7*  HCT 28.6*  PLT 172   BMET  Recent Labs  02/27/15 0725  NA 137  K 4.0  CL 106  CO2 24  GLUCOSE 135*  BUN 13  CREATININE 0.84  CALCIUM 8.5*   PT/INR  Recent Labs  02/28/15 0436 03/01/15 0409  LABPROT 17.2* 18.1*  INR 1.39 1.49   ABG No results for input(s): PHART, HCO3 in the last 72 hours.  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 There are numerous mildly dilated loops of small bowel in the mid abdomen and left abdomen measuring up to 3.5 cm. IV contrast is within the bladder. IMPRESSION: Dilated small bowel loops are concerning for mild incomplete small bowel obstruction. Contrast has reached the transverse colon. Electronically Signed   By: Skipper Cliche M.D.   On: 02/28/2015 10:09    Anti-infectives: Anti-infectives    Start     Dose/Rate Route Frequency Ordered Stop   02/28/15 1345  doxycycline (VIBRA-TABS) tablet 100 mg     100 mg Oral Every 12 hours 02/28/15 1330 03/05/15 1459   02/27/15 1900  Ampicillin-Sulbactam (UNASYN) 3 g in sodium chloride 0.9 % 100 mL IVPB  Status:  Discontinued     3 g 100 mL/hr over 60 Minutes Intravenous Every 8 hours 02/27/15 1813 02/28/15 1330   02/27/15 0000  levofloxacin (LEVAQUIN) 750 MG tablet     750 mg Oral Daily 02/27/15 0936        Assessment/Plan:  No ostomy output appreciable in the last few days abdominal pain nausea vomiting is all resolved she wants to drink liquids and I will give her a clear liquid diet at this point and place another Dulcolax suppository will order films for tomorrow morning. If this continues to be a problem she may need relocation of her ostomy.  Florene Glen, MD, FACS  03/01/2015

## 2015-03-01 NOTE — Progress Notes (Signed)
[name] at [facility] NAME: [name]    MR#:  [id number]  DATE OF BIRTH:  Dec 17, 1955  SUBJECTIVE:  CHIEF COMPLAINT:   Chief Complaint  Patient presents with  . Shortness of Breath   patient is 60 year old Caucasian female with past medical history significant for a history of COPD, asthma, PTSD,  squamous cell cancer of anus, status post colostomy who presents to the hospital with complaints of shortness of breath and wheezing, she was admitted for COPD exacerbation and feels improved now. While in the hospital, she complained of epigastric abdominal pain. CT scan of abdomen and pelvis revealed dilated small bowel loops suggesting ileus or obstruction, left lower quadrant ostomy, pelvic ascites, right pleural effusion, right lower lobe pneumonia. No metastasis was noted in abdomen and pelvis.  Patient feels better today in regards to the single shortness of breath, she is still nothing by mouth, no stool in colostomy pouch.  MiraLAX was tried, however, still no stool. Dr. Burt Knack recommends Dulcolax suppository, which is going to be tried today. If however there is no stool, Dr. Burt Knack feels that that peristomal hernia could be an issue, then stoma may require repair order location. Patient complains of some blood with sputum while coughing, sputum is of yellow color  Review of Systems  Constitutional: Negative for fever, chills and weight loss.  HENT: Negative for congestion.   Eyes: Negative for blurred vision and double vision.  Respiratory: Positive for cough, shortness of breath and wheezing. Negative for sputum production.   Cardiovascular: Negative for chest pain, palpitations, orthopnea, leg swelling and PND.  Gastrointestinal: Positive for abdominal pain. Negative for nausea, vomiting, diarrhea, constipation and blood in stool.  Genitourinary: Negative for dysuria, urgency, frequency and hematuria.  Musculoskeletal: Negative for  falls.  Neurological: Negative for dizziness, tremors, focal weakness and headaches.  Endo/Heme/Allergies: Does not bruise/bleed easily.  Psychiatric/Behavioral: Negative for depression. The patient does not have insomnia.     VITAL SIGNS: Blood pressure 117/80, pulse 70, temperature 98 F (36.7 C), temperature source Oral, resp. rate 18, height '5\' 6"'$  (1.676 m), weight 87.091 kg (192 lb), SpO2 93 %.  PHYSICAL EXAMINATION:   GENERAL:  60 y.o.-year-old patient lying in the bed with no acute distress.  EYES: Pupils equal, round, reactive to light and accommodation. No scleral icterus. Extraocular muscles intact.  HEENT: Head atraumatic, normocephalic. Oropharynx and nasopharynx clear.  NECK:  Supple, no jugular venous distention. No thyroid enlargement, no tenderness.  LUNGS: [place] entrance bilaterally at bases, scattered wheezing, few rales,rhonchi , but no crepitations. No use of accessory muscles of respiration.  CARDIOVASCULAR: S1, S2 normal. No murmurs, rubs, or gallops.  ABDOMEN: Soft, mildly tender in epigastric area,, mild distention. Bowel sounds present. No organomegaly or mass. Colostomy pouch is  empty EXTREMITIES: No pedal edema, cyanosis, or clubbing.  NEUROLOGIC: Cranial nerves II through XII are intact. Muscle strength 5/5 in all extremities. Sensation intact. Gait not checked.  PSYCHIATRIC: The patient is alert and oriented x 3.  SKIN: No obvious rash, lesion, or ulcer.   ORDERS/RESULTS REVIEWED:   CBC  Recent Labs Lab 02/26/15 1229 02/27/15 0725  WBC 7.0 5.5  HGB 10.9* 9.7*  HCT 32.4* 28.6*  PLT 200 172  MCV 92.7 93.9  MCH 31.2 31.7  MCHC 33.6 33.8  RDW 16.0* 15.9*   ------------------------------------------------------------------------------------------------------------------  Chemistries   Recent Labs Lab 02/26/15 1229 02/27/15 0725  NA 139 137  K 3.6 4.0  CL 105  106  CO2 28 24  GLUCOSE 102* 135*  BUN 11 13  CREATININE 0.80 0.84  CALCIUM  8.5* 8.5*   ------------------------------------------------------------------------------------------------------------------ estimated creatinine clearance is 81.1 mL/min (by C-G formula based on Cr of 0.84). ------------------------------------------------------------------------------------------------------------------ No results for input(s): TSH, T4TOTAL, T3FREE, THYROIDAB in the last 72 hours.  Invalid input(s): FREET3  Cardiac Enzymes No results for input(s): CKMB, TROPONINI, MYOGLOBIN in the last 168 hours.  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 There are numerous mildly dilated loops of small bowel in the mid abdomen and left abdomen measuring up to 3.5 cm. IV contrast is within the bladder. IMPRESSION: Dilated small bowel loops are concerning for mild incomplete small bowel obstruction. Contrast has reached the transverse colon. Electronically Signed   By: Skipper Cliche M.D.   On: 02/28/2015 10:09    EKG:  Orders placed or performed during the hospital encounter of 02/26/15  . EKG 12-Lead  . EKG 12-Lead  . ED EKG  . ED EKG    ASSESSMENT AND PLAN:  Active Problems:   COPD exacerbation (HCC)   Acute bronchitis   Abdominal pain, chronic, epigastric   COPD (chronic obstructive pulmonary disease) (HCC)   Hyperglycemia   Chronic pain syndrome   Ileus (HCC)   SBO (small bowel obstruction) (HCC)  #1 COPD exacerbation due to aspiration pneumonitis, continue patient on nebulizers and inhalers  as  tapering steroids. Patient's chest x-ray was unremarkable. However, patient's CT scan revealed right-sided pneumonia, concerning for aspiration pneumonitis, continue Unasyn for now, change to Augmentin when able to take orally #2. Aspiration pneumonitis, continue Unasyn,change to Augmentin when able to take orally #3. Small bowel obstruction, ?due to adhesions versus pseudoobstruction due to constipation versus paristomal hernia, per surgery, continue patient nothing by mouth,  low rate IV fluids, watch for worsening ascites and fluid collection in pleural space, stable for now #4 anemia, get Hemoccult, there is no active bleeding, follow hemoglobin level in the morning  #5. Constipation, continue patient on docusate, MiraLAX, suppositories, following stool output #6. Mild hemoptysis, likely due to aspiration pneumonitis, follow closely. The Patient is on Coumadin, but she is subtherapeutic  Management plans discussed with the patient, family and they are in agreement.   DRUG ALLERGIES:  Allergies  Allergen Reactions  . Sulfa Antibiotics Hives    CODE STATUS:     Code Status Orders        Start     Ordered   02/26/15 1744  Full code   Continuous     02/26/15 1743    Code Status History    Date Active Date Inactive Code Status Order ID Comments User Context   10/14/2014 10:59 PM 10/17/2014  4:34 PM Full Code [id number]  Lytle Butte, MD ED   05/17/2014  8:38 PM 05/30/2014  4:55 PM Full Code [id number]  Fritzi Mandes, MD Inpatient    Advance Directive Documentation        Most Recent Value   Type of Advance Directive  Healthcare Power of Attorney   Pre-existing out of facility DNR order (yellow form or pink MOST form)     "MOST" Form in Place?        TOTAL TIME TAKING CARE OF THIS PATIENT: 35 minutes.  Theodoro Grist M.D on 03/01/2015 at 3:39 PM  Between 7am to 6pm - Pager - [phone]  After 6pm go to [email or web address] - password [email or web address]   Office  [phone]  CC: Primary care physician; [name], NP

## 2015-03-01 NOTE — Consult Note (Addendum)
ANTICOAGULATION CONSULT NOTE - follow up [place] for warfarin Indication: VTE prophylaxis, hx DVT upper extremity  Allergies  Allergen Reactions  . Sulfa Antibiotics Hives    Patient Measurements: Height: '5\' 6"'$  (167.6 cm) Weight: 192 lb (87.091 kg) IBW/kg (Calculated) : 59.3 Heparin Dosing Weight:   Vital Signs: Temp: 98 F (36.7 C) (02/23 0539) Temp Source: Oral (02/23 0539) BP: 113/61 mmHg (02/23 0539) Pulse Rate: 86 (02/23 0539)  Labs:  Recent Labs  02/26/15 1229  02/27/15 0725 02/28/15 0436 03/01/15 0409  HGB 10.9*  --  9.7*  --   --   HCT 32.4*  --  28.6*  --   --   PLT 200  --  172  --   --   LABPROT  --   < > 14.8 17.2* 18.1*  INR  --   < > 1.14 1.39 1.49  CREATININE 0.80  --  0.84  --   --   < > = values in this interval not displayed.  Estimated Creatinine Clearance: 81.1 mL/min (by C-G formula based on Cr of 0.84).   Medical History: Past Medical History  Diagnosis Date  . Schizophrenia ([place])   . Asthma   . GERD (gastroesophageal reflux disease)   . Anxiety   . Depression   . Bipolar disorder ([place])   . COPD (chronic obstructive pulmonary disease) ([place])   . Occasional tremors     right hand  . PTSD (post-traumatic stress disorder)   . Shortness of breath dyspnea   . Fibromyalgia   . DVT (deep venous thrombosis) ([place]) 2011    RUE  . Thyroid nodule   . DDD (degenerative disc disease), lumbar   . Spinal stenosis   . Peripheral neuropathy ([place])   . Rotator cuff tear     right  . Pneumonia 2011  . Hypothyroidism     no meds currently  . Anemia     during pregnancy only  . Hypertension     Off meds x 15 years-well controlled now per pt  . Squamous cell cancer, anus (HCC)   . Severe obstructive sleep apnea 06/27/2014  . DVT of upper extremity (deep vein thrombosis) ([place]) 10/14/2014    Medications:  Scheduled:  . ARIPiprazole  2 mg Oral Daily  . baclofen  10 mg Oral TID  . budesonide-formoterol  2 puff Inhalation BID   . calcium-vitamin D  1 tablet Oral BID  . docusate sodium  100 mg Oral Daily  . doxycycline  100 mg Oral Q12H  . DULoxetine  60 mg Oral Daily  . nicotine  21 mg Transdermal Daily  . oxybutynin  5 mg Oral BID  . pantoprazole  40 mg Oral Daily  . polyethylene glycol  17 g Oral Daily  . predniSONE  60 mg Oral Q breakfast  . pregabalin  200 mg Oral BID  . tiotropium  18 mcg Inhalation Daily  . warfarin (COUMADIN) tablet 9 mg  9 mg Oral ONCE-1800  . Warfarin - Pharmacist Dosing Inpatient   Does not apply q1800    Assessment: Pt is a 60 year old female with a recent DVT taking warfarin at home. Home dose per patient is '8mg'$  daily. Pt had held her warfarin from 2/10 to 2/17 to have an edoscopy/colonoscopy but was unable to have the procedure. Pt resumed warfarin on the 17th. Today 2/20 her INR is 1.13.  2/20  INR = 1.13   Warfarin 8 mg 2/21  INR =  1.14   Warfarin 10 mg 2/22  INR = 1.39   Warfarin 8 mg 2/23  INR = 1.49  Goal of Therapy:  INR 2-3 Monitor platelets by anticoagulation protocol: Yes   Plan:  INR remains subtherapeutic but increasing.  Will continue Warfarin 8 mg and  f/u AM INR.  (Considered increase to 9 mg x 1 but per MD patient had some slight hemoptysis so maintained current regimen)  Chinita Greenland PharmD Clinical Pharmacist 03/01/2015  7:50 AM

## 2015-03-01 NOTE — Progress Notes (Signed)
Pt is refusing to wear BIPAP for sleep. Pt has been refusing for several nights

## 2015-03-02 ENCOUNTER — Inpatient Hospital Stay: Payer: Commercial Managed Care - HMO

## 2015-03-02 DIAGNOSIS — I82729 Chronic embolism and thrombosis of deep veins of unspecified upper extremity: Secondary | ICD-10-CM

## 2015-03-02 DIAGNOSIS — K922 Gastrointestinal hemorrhage, unspecified: Secondary | ICD-10-CM

## 2015-03-02 LAB — CBC
HEMATOCRIT: 31.6 % — AB (ref 35.0–47.0)
Hemoglobin: 10.4 g/dL — ABNORMAL LOW (ref 12.0–16.0)
MCH: 31.5 pg (ref 26.0–34.0)
MCHC: 32.9 g/dL (ref 32.0–36.0)
MCV: 95.6 fL (ref 80.0–100.0)
Platelets: 174 10*3/uL (ref 150–440)
RBC: 3.31 MIL/uL — AB (ref 3.80–5.20)
RDW: 15.8 % — ABNORMAL HIGH (ref 11.5–14.5)
WBC: 6.4 10*3/uL (ref 3.6–11.0)

## 2015-03-02 LAB — PROTIME-INR
INR: 1.76
Prothrombin Time: 20.5 seconds — ABNORMAL HIGH (ref 11.4–15.0)

## 2015-03-02 MED ORDER — DOXYCYCLINE HYCLATE 100 MG PO TABS: 100.0000 mg | ORAL_TABLET | Freq: Two times a day (BID) | ORAL | Status: DC

## 2015-03-02 MED ORDER — NICOTINE POLACRILEX 2 MG MT GUM: 2.0000 mg | CHEWING_GUM | OROMUCOSAL | Status: DC | PRN

## 2015-03-02 MED ORDER — NICOTINE 21 MG/24HR TD PT24: 21.0000 mg | MEDICATED_PATCH | Freq: Every day | TRANSDERMAL | Status: AC

## 2015-03-02 MED ORDER — PREDNISONE 10 MG (21) PO TBPK: 10.0000 mg | ORAL_TABLET | Freq: Every day | ORAL | Status: DC

## 2015-03-02 MED ORDER — POLYETHYLENE GLYCOL 3350 17 G PO PACK: 17.0000 g | PACK | Freq: Every day | ORAL | Status: AC

## 2015-03-02 MED ORDER — ONDANSETRON HCL 4 MG PO TABS: 4.0000 mg | ORAL_TABLET | Freq: Three times a day (TID) | ORAL | Status: DC | PRN

## 2015-03-02 MED ORDER — IPRATROPIUM-ALBUTEROL 0.5-2.5 (3) MG/3ML IN SOLN: 3.0000 mL | RESPIRATORY_TRACT | Status: DC | PRN

## 2015-03-02 NOTE — Care Management Important Message (Signed)
Important Message  Patient Details  Name: [name] MRN: [id number] Date of Birth: 04-20-1955   Medicare Important Message Given:  Yes    Juliann Pulse A Paralee Pendergrass 03/02/2015, 1:37 PM

## 2015-03-02 NOTE — Progress Notes (Signed)
CC: SBO hx anal CA an diverting colostomy  Subjective: Feeling much better, multiple BMs. Taking PO , no N/V. KUB no evidence of obstruction Objective: Vital signs in last 24 hours: Temp:  [97.3 F (36.3 C)-97.5 F (36.4 C)] 97.5 F (36.4 C) (02/24 1440) Pulse Rate:  [38-93] 70 (02/24 1440) Resp:  [18-20] 20 (02/24 1440) BP: (111-163)/(63-140) 163/140 mmHg (02/24 1440) SpO2:  [88 %-100 %] 88 % (02/24 1440) Last BM Date: 03/02/15  Intake/Output from previous day: 02/23 0701 - 02/24 0700 In: 760 [P.O.:760] Out: 1250 [Urine:1250] Intake/Output this shift: Total I/O In: 1360 [P.O.:1360] Out: 750 [Urine:750]  Physical exam: NAD awake alert Chest CTA, Cardiac NSR s2,s2 Abd: Soft, NT, parastomal hernia good ostomy output. Ext: No edema, well perfused   Lab Results: CBC   Recent Labs  03/02/15 0432  WBC 6.4  HGB 10.4*  HCT 31.6*  PLT 174   BMET No results for input(s): NA, K, CL, CO2, GLUCOSE, BUN, CREATININE, CALCIUM in the last 72 hours. PT/INR  Recent Labs  03/01/15 0409 03/02/15 0432  LABPROT 18.1* 20.5*  INR 1.49 1.76   ABG No results for input(s): PHART, HCO3 in the last 72 hours.  Invalid input(s): PCO2, PO2  Studies/Results: US Venous Img Upper Uni Left  03/02/2015  CLINICAL DATA:  Left upper extremity DVT, subsequent counter EXAM: LEFT UPPER EXTREMITY VENOUS DOPPLER ULTRASOUND TECHNIQUE: Gray-scale sonography with graded compression, as well as color Doppler and duplex ultrasound were performed to evaluate the upper extremity deep venous system from the level of the subclavian vein and including the jugular, axillary, basilic, radial, ulnar and upper cephalic vein. Spectral Doppler was utilized to evaluate flow at rest and with distal augmentation maneuvers. COMPARISON:  10/14/2014 FINDINGS: Contralateral Subclavian Vein: Respiratory phasicity is normal and symmetric with the symptomatic side. No evidence of thrombus. Normal compressibility. Internal  Jugular Vein: Previous thrombus has resolved. Normal compressibility, respiratory phasicity and response to augmentation. Subclavian Vein: Previous thrombus has resolved. Normal compressibility, respiratory phasicity and response to augmentation. Axillary Vein: Previous thrombus has resolved. Normal compressibility, respiratory phasicity and response to augmentation. Cephalic Vein: Distal superficial cephalic vein at the level of the antecubital fossa demonstrates a short segment of residual occlusive thrombus with noncompressibility. Very minor residual thrombus burden. Basilic Vein: No evidence of thrombus. Normal compressibility, respiratory phasicity and response to augmentation. Brachial Veins: Brachial veins are paired. Proximal upper arm brachial vein demonstrates a short segment of residual intraluminal thrombus appearing noncompressible and nearly occlusive. Mid and distal brachial veins are now patent. Radial Veins: No evidence of thrombus. Normal compressibility, respiratory phasicity and response to augmentation. Ulnar Veins: No evidence of thrombus. Normal compressibility, respiratory phasicity and response to augmentation. Venous Reflux:  None visualized. Other Findings:  None visualized. IMPRESSION: Resolved thrombus in the right internal jugular, subclavian and axillary veins. Small amount of residual thrombus in the proximal right brachial vein over a short segment. Very low thrombus burden. Residual superficial thrombosis of the distal cephalic vein across the antecubital fossa, also very low residual thrombus burden. Electronically Signed   By: Jerilynn Mages.  Shick M.D.   On: 03/02/2015 13:07   Dg Abd 2 Views  03/02/2015  CLINICAL DATA:  Recent bowel obstruction EXAM: ABDOMEN - 2 VIEW COMPARISON:  February 28, 2015 FINDINGS: Supine and upright images obtained. Contrast is seen throughout the colon. There is no longer appreciable small bowel dilatation. There are no appreciable air-fluid levels. No free  air. There are surgical clips in the pelvis. There are  scattered vascular calcifications in the pelvis. There are scattered left-sided colonic diverticula. IMPRESSION: Bowel gas pattern now considered within normal limits. No demonstrable obstruction or free air. Contrast is seen throughout the colon. There are scattered left-sided colonic diverticula. Electronically Signed   By: Lowella Grip III M.D.   On: 03/02/2015 08:10    Anti-infectives: Anti-infectives    Start     Dose/Rate Route Frequency Ordered Stop   03/02/15 0000  doxycycline (VIBRA-TABS) 100 MG tablet     100 mg Oral Every 12 hours 03/02/15 1508     02/28/15 1345  doxycycline (VIBRA-TABS) tablet 100 mg     100 mg Oral Every 12 hours 02/28/15 1330 03/05/15 1459   02/27/15 1900  Ampicillin-Sulbactam (UNASYN) 3 g in sodium chloride 0.9 % 100 mL IVPB  Status:  Discontinued     3 g 100 mL/hr over 60 Minutes Intravenous Every 8 hours 02/27/15 1813 02/28/15 1330   02/27/15 0000  levofloxacin (LEVAQUIN) 750 MG tablet  Status:  Discontinued     750 mg Oral Daily 02/27/15 0936 03/02/15       Assessment/Plan: Improving SBO,  Advance diet as tolerated Depending on cancer situation may entertain colotomy takedown w hernia repair vs parastomal hernia repair. May see in the office with Dr. Williemae Natter, MD, [facility]  03/02/2015

## 2015-03-02 NOTE — Plan of Care (Signed)
Problem: Tissue Perfusion: Goal: Risk factors for ineffective tissue perfusion will decrease Outcome: Not Progressing sats  Low 80s when ambulated in hall this pm. md notified  Of results and pt s discharge cancelled. 02 1l [place] now. Possible home with home o2 in am. Stool several times via colostomy now

## 2015-03-02 NOTE — Progress Notes (Signed)
[facility]  Date of admission:  02/26/2015  Inpatient day:  03/01/2015  Consulting physician: Dr. Theodoro Grist  Reason for Consultation:  Abdominal pain with what looks small bowel obstruction on CT scan  Chief Complaint: [name] is a 60 y.o. 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  HPI: The patient initially presented with anal cancer in 05/2014.  Chest, abdomen, and pelvic CT scan on 05/19/2014 revealed a 4.7 x 4.1 x 6.0 cm lower rectal and anal mass consistent with anal cancer.  She underwent diverting colostomy on 05/26/2014. She received concurrent chemotherapy (5FU and mitomycin-C) and radiation (06/29/2014 - 07/31/2014).  During her treatment, she was diagnosed with B12 deficiency (169) and folate deficiency (5.2). She receives B12 IM monthly (last 35/32/9924) and oral folic acid.  She developed a left upper extremity DVT on 10/14/2014. She underwent thrombolysis, thrombectomy, and port removal on 10/16/2014. She was on Lovenox and Coumadin (followed by Dr. Lucky Cowboy, vascular surgery). INR has been monitored by Dr Julian Hy.  Per patient, she has had her Coumadin increased recently to 9 mg a day because of a sub-therapeutic INR.  Plans were for follow-up left upper extremity ultrasound and possible discontinuation of Coumadin.  She was last seen in the medical oncology clinic on 01/23/2015.  At that time, she noted mild intermittent abdominal pain and blood in her ostomy.  She was seen by Dr. Candace Cruise.  She states that she was unable to complete the bowel prep on 02/21/2015 secondary to nausea and vomiting.  She did not keep her appointment for endoscopy.  She notes that she began hurting in her chest on 02/25/2015.  She felt tight.  She noted cough, green phlegm, but no fever.  On 02/26/2015, she called 911.  She states that her oxygen saturations  were initially 88% in the ambulance.  She has subsequently been diagnosed with a RLL pneumonia and COPD flare.  She is on Unasyn, prednisone, Proventil, and Spiriva.  She states that she began to have abdominal pain on 02/16 or 02/23/2015.  Pain progressed on 02/25/2015.  She describes eating a lot at home without nausea or vomiting.  She was noted to have no output from her ostomy.  Abdominal and pelvic CT scan on 02/27/2015 revealed dilated small bowel suggesting ileus or mechanical obstruction.  There was a somewhat gradual transition to decompressed small bowel loops along the anterior pelvic inlet (? postoperative adhesions).  The left lower quadrant ostomy has a 10 cm parastomal hernia containing bowel, mesenteric, and ascites.  This does not appear related to the obstruction.  There was new small volume abdominal and moderate volume pelvic ascites.  She states that she has been able to take small amounts of liquid with ice chips.  She has had no output.    Past Medical History  Diagnosis Date  . Schizophrenia ([place])   . Asthma   . GERD (gastroesophageal reflux disease)   . Anxiety   . Depression   . Bipolar disorder ([place])   . COPD (chronic obstructive pulmonary disease) ([place])   . Occasional tremors     right hand  . PTSD (post-traumatic stress disorder)   . Shortness of breath dyspnea   . Fibromyalgia   . DVT (deep venous thrombosis) ([place]) 2011    RUE  . Thyroid nodule   . DDD (degenerative disc disease), lumbar   . Spinal stenosis   .  Peripheral neuropathy ([place])   . Rotator cuff tear     right  . Pneumonia 2011  . Hypothyroidism     no meds currently  . Anemia     during pregnancy only  . Hypertension     Off meds x 15 years-well controlled now per pt  . Squamous cell cancer, anus (HCC)   . Severe obstructive sleep apnea 06/27/2014  . DVT of upper extremity (deep vein thrombosis) ([place]) 10/14/2014    Past Surgical History  Procedure Laterality Date  . Foot surgery Right    . Tubal ligation    . Eye surgery Bilateral   . Mouth surgery  2002  . Rectal biopsy N/A 05/08/2014    Procedure: BIOPSY RECTAL;  Surgeon: Marlyce Huge, MD;  Location: [facility] ORS;  Service: General;  Laterality: N/A;  . Evaluation under anesthesia with hemorrhoidectomy N/A 05/08/2014    Procedure: EXAM UNDER ANESTHESIA WITH HEMORRHOIDECTOMY;  Surgeon: Marlyce Huge, MD;  Location: [facility] ORS;  Service: General;  Laterality: N/A;  . Portacath placement N/A 05/20/2014    Procedure: INSERTION PORT-A-CATH;  Surgeon: Florene [place], MD;  Location: [facility] ORS;  Service: General;  Laterality: N/A;  . Laparoscopic diverted colostomy N/A 05/26/2014    Procedure: LAPAROSCOPIC DIVERTED COLOSTOMY;  Surgeon: Marlyce Huge, MD;  Location: [facility] ORS;  Service: General;  Laterality: N/A;  . Peripheral vascular catheterization Left 10/16/2014    Procedure: Upper Extremity Venography with thrombectomy, port removal;  Surgeon: Algernon Huxley, MD;  Location: [facility];  Service: Cardiovascular;  Laterality: Left;  . Peripheral vascular catheterization  10/16/2014    Procedure: Upper Extremity Intervention;  Surgeon: Algernon Huxley, MD;  Location: [facility];  Service: Cardiovascular;;    Family History  Problem Relation Age of Onset  . Cancer Maternal Aunt   . Cancer Paternal Uncle   . Diabetes Mother   . Thyroid disease Mother   . Kidney failure Mother   . Hypertension Mother   . Depression Mother   . Mental illness Son   . Aneurysm Maternal Grandmother   . Thyroid disease Maternal Grandmother   . Stroke Maternal Grandfather   . Hypertension Maternal Grandfather   . Diabetes Maternal Grandfather   . Heart disease Maternal Grandfather     MI  . Nephrolithiasis Daughter     Social History:  reports that she has been smoking Cigarettes.  She started smoking about 44 years ago. She has a 11 pack-year smoking history. She has never used smokeless tobacco. She reports that  she uses illicit drugs (Marijuana). She reports that she does not drink alcohol.  She is alone today.  Allergies:  Allergies  Allergen Reactions  . Sulfa Antibiotics Hives    Medications Prior to Admission  Medication Sig Dispense Refill  . albuterol (PROVENTIL HFA;VENTOLIN HFA) 108 (90 BASE) MCG/ACT inhaler Inhale 2 puffs into the lungs every 6 (six) hours as needed for wheezing or shortness of breath.     . ARIPiprazole (ABILIFY) 2 MG tablet Take 1 tablet (2 mg total) by mouth daily. 30 tablet 4  . budesonide-formoterol (SYMBICORT) 160-4.5 MCG/ACT inhaler Inhale 2 puffs into the lungs 2 (two) times daily. 1 Inhaler 12  . Calcium Carb-Cholecalciferol (CALCIUM 600+D) 600-800 MG-UNIT TABS Take 1 tablet by mouth 2 (two) times daily.     . clonazePAM (KLONOPIN) 1 MG tablet Take 1 tablet (1 mg total) by mouth 3 (three) times daily as needed for anxiety. 90 tablet 4  .  docusate sodium (COLACE) 100 MG capsule Take 100 mg by mouth daily.    . DULoxetine (CYMBALTA) 60 MG capsule Take 60 mg by mouth at bedtime.    Marland Kitchen escitalopram (LEXAPRO) 20 MG tablet Take 1 tablet (20 mg total) by mouth daily. 30 tablet 4  . oxyCODONE-acetaminophen (PERCOCET) 10-325 MG tablet Take 1 tablet by mouth every 6 (six) hours as needed for pain. 60 tablet 0  . pantoprazole (PROTONIX) 40 MG tablet Take 40 mg by mouth 2 (two) times daily.     . pregabalin (LYRICA) 200 MG capsule Take 1 capsule (200 mg total) by mouth 2 (two) times daily. 90 capsule 6  . tiotropium (SPIRIVA) 18 MCG inhalation capsule Place 1 capsule (18 mcg total) into inhaler and inhale daily. 30 capsule 12  . warfarin (COUMADIN) 3 MG tablet Take 3 mg by mouth every evening. Pt takes with a '5mg'$  tablet.    . warfarin (COUMADIN) 5 MG tablet Take 5 mg by mouth every evening. Pt takes with a '3mg'$  tablet.    Marland Kitchen zolpidem (AMBIEN) 10 MG tablet Take 1 tablet (10 mg total) by mouth at bedtime as needed for sleep. 30 tablet 4  . baclofen (LIORESAL) 10 MG tablet Take 1  tablet (10 mg total) by mouth 3 (three) times daily. (Patient not taking: Reported on 02/28/2015) 90 each 6    Review of Systems: GENERAL:  Feels tired.  No fevers, sweats or weight loss. PERFORMANCE STATUS (ECOG):  1 HEENT:  No visual changes, runny nose, sore throat, mouth sores or tenderness. Lungs: Cough.  Green phlegm.  Shortness of breath , improved.  Scant hemoptysis. Cardiac:  No chest pain, palpitations, orthopnea, or PND. GI:  Abdominal pain above ostomy.  No stool output.  Little nausea.  No vomiting. h/o blood in ostomy. GU:  No urgency, frequency, dysuria, or hematuria. Musculoskeletal:  No back pain.  No joint pain.  No muscle tenderness. Extremities:  No pain or swelling. Skin:  No rashes or skin changes. Neuro:  No headache, numbness or weakness, balance or coordination issues. Endocrine:  No diabetes, thyroid issues, hot flashes or night sweats. Psych:  No mood changes, depression or anxiety. Pain:  No focal pain. Review of systems:  All other systems reviewed and found to be negative.  Physical Exam:  Blood pressure 131/66, pulse 68, temperature 97.4 F (36.3 C), temperature source Oral, resp. rate 19, height '5\' 6"'$  (1.676 m), weight 192 lb (87.091 kg), SpO2 100 %.  GENERAL:  Well developed, well nourished, woman watching TV on the medical unit in no acute distress. MENTAL STATUS:  Alert and oriented to person, place and time. HEAD:  Long gray hair.  Normocephalic, atraumatic, face symmetric, no Cushingoid features. EYES:  Blue eyes.  Pupils equal round and reactive to light and accomodation.  No conjunctivitis or scleral icterus. ENT:  Oropharynx clear without lesion.  Tongue normal. Mucous membranes moist.  RESPIRATORY:  Decreased breath sounds right base.  Soft wheezes.  Crackles RLL on deep inspiration.  No rhonchi. CARDIOVASCULAR:  Regular rate and rhythm without murmur, rub or gallop. ABDOMEN:  Left sided ostomy with tenderness above site.  No stool in bag.  No  guarding or rebound tenderness.  Bowel sounds.  No hepatosplenomegaly.  No masses. SKIN:  No rashes, ulcers or lesions. EXTREMITIES: No edema, no skin discoloration or tenderness.  No palpable cords. LYMPH NODES: No palpable cervical, supraclavicular, axillary or inguinal adenopathy  NEUROLOGICAL: Unremarkable. PSYCH:  Appropriate.  Results for orders placed  or performed during the hospital encounter of 02/26/15 (from the past 48 hour(s))  Protime-INR     Status: Abnormal   Collection Time: 03/01/15  4:09 AM  Result Value Ref Range   Prothrombin Time 18.1 (H) 11.4 - 15.0 seconds   INR 1.49    Dg Abd 1 View  02/28/2015  CLINICAL DATA:  No bowel movement in several weeks, colostomy since 2016 due to anal carcinoma EXAM: ABDOMEN - 1 VIEW COMPARISON:  06/02/2014, 02/27/2015 FINDINGS: Oral contrast has not reached the transverse colon. There are numerous mildly dilated loops of small bowel in the mid abdomen and left abdomen measuring up to 3.5 cm. IV contrast is within the bladder. IMPRESSION: Dilated small bowel loops are concerning for mild incomplete small bowel obstruction. Contrast has reached the transverse colon. Electronically Signed   By: Skipper Cliche M.D.   On: 02/28/2015 10:09    Assessment:  The patient is a 60 y.o. 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  She has a history of left upper extremity DVT on 10/14/2014. She underwent thrombolysis, thrombectomy, and port removal on 10/16/2014. She is on Coumadin.  Plan:   1.  Hematology/Oncology:  Patient had an excellent response to treatment.  Suspect obstruction due to adhesions rather than recurrent disease.  Hopefully, the obstruction will resolve with conservative management.  Otherwise, may need surgery per Dr. Burt Knack.  Patient has K59 and folic acid deficiency on supplimentation.  Recently, she has noticed some  blood in her stool.  Continue supplimentation.  Guaiac stools.  Check iron studies.  Left upper extremity DVT caused by port-a-cath.  Port was removed.  Will repeat ultrasound (planned per Dr Lucky Cowboy) with decision about need for further anticoagulation.  2.  Pulmonary:  COPD flare with associated RLL pneumonia improving.  Continue antibiotics with switch to orals, prednisone taper, Proventil, and Spiriva.  3.  Gastroenterology:  Small bowel obstruction.  If unresolved, may require surgery.   Thank you for allowing me to participate in [name] 's care.  I will follow her closely with you while hospitalized and after discharge in the outpatient department.  Lequita Asal, MD  03/01/2015

## 2015-03-02 NOTE — Consult Note (Signed)
ANTICOAGULATION CONSULT NOTE - follow up [place] for warfarin Indication: VTE prophylaxis, hx DVT upper extremity  Allergies  Allergen Reactions  . Sulfa Antibiotics Hives    Patient Measurements: Height: '5\' 6"'$  (167.6 cm) Weight: 192 lb (87.091 kg) IBW/kg (Calculated) : 59.3 Heparin Dosing Weight:   Vital Signs: Temp: 97.4 F (36.3 C) (02/24 0452) Temp Source: Oral (02/24 0452) BP: 131/66 mmHg (02/24 0452) Pulse Rate: 68 (02/24 0452)  Labs:  Recent Labs  02/28/15 0436 03/01/15 0409 03/02/15 0432  HGB  --   --  10.4*  HCT  --   --  31.6*  PLT  --   --  174  LABPROT 17.2* 18.1* 20.5*  INR 1.39 1.49 1.76    Estimated Creatinine Clearance: 81.1 mL/min (by C-G formula based on Cr of 0.84).   Medical History: Past Medical History  Diagnosis Date  . Schizophrenia ([place])   . Asthma   . GERD (gastroesophageal reflux disease)   . Anxiety   . Depression   . Bipolar disorder ([place])   . COPD (chronic obstructive pulmonary disease) ([place])   . Occasional tremors     right hand  . PTSD (post-traumatic stress disorder)   . Shortness of breath dyspnea   . Fibromyalgia   . DVT (deep venous thrombosis) ([place]) 2011    RUE  . Thyroid nodule   . DDD (degenerative disc disease), lumbar   . Spinal stenosis   . Peripheral neuropathy ([place])   . Rotator cuff tear     right  . Pneumonia 2011  . Hypothyroidism     no meds currently  . Anemia     during pregnancy only  . Hypertension     Off meds x 15 years-well controlled now per pt  . Squamous cell cancer, anus (HCC)   . Severe obstructive sleep apnea 06/27/2014  . DVT of upper extremity (deep vein thrombosis) ([place]) 10/14/2014    Medications:  Scheduled:  . ARIPiprazole  2 mg Oral Daily  . baclofen  10 mg Oral TID  . budesonide-formoterol  2 puff Inhalation BID  . calcium-vitamin D  1 tablet Oral BID  . docusate sodium  100 mg Oral Daily  . doxycycline  100 mg Oral Q12H  . DULoxetine  60 mg Oral Daily   . nicotine  21 mg Transdermal Daily  . oxybutynin  5 mg Oral BID  . pantoprazole  40 mg Oral Daily  . polyethylene glycol  17 g Oral Daily  . predniSONE  60 mg Oral Q breakfast  . pregabalin  200 mg Oral BID  . tiotropium  18 mcg Inhalation Daily  . warfarin  8 mg Oral q1800  . Warfarin - Pharmacist Dosing Inpatient   Does not apply q1800    Assessment: Pt is a 60 year old female with a recent DVT taking warfarin at home. Home dose per patient is '8mg'$  daily. Pt had held her warfarin from 2/10 to 2/17 to have an edoscopy/colonoscopy but was unable to have the procedure. Pt resumed warfarin on the 17th. Today 2/20 her INR is 1.13.  2/20  INR = 1.13   Warfarin 8 mg 2/21  INR = 1.14   Warfarin 10 mg 2/22  INR = 1.39   Warfarin 8 mg 2/23  INR = 1.49   Warfarin 8 mg 2/24  INR = 1.76  Goal of Therapy:  INR 2-3 Monitor platelets by anticoagulation protocol: Yes   Plan:  INR remains subtherapeutic but  increasing.  Will continue Warfarin 8 mg and  f/u AM INR.  Chinita Greenland PharmD Clinical Pharmacist 03/02/2015  7:30 AM

## 2015-03-02 NOTE — Progress Notes (Signed)
Dr Ether Griffins made aware that pt o2 sats 83%-85% with exertion on room air, MD states that pt will need o2 to be set up for home use, will discharge tomorrow with o2

## 2015-03-02 NOTE — Progress Notes (Signed)
Pt is refusing to wear BIPAP

## 2015-03-02 NOTE — Progress Notes (Signed)
[facility] NAME: [name]    MR#:  [id number]  DATE OF BIRTH:  1955-07-25  SUBJECTIVE:  CHIEF COMPLAINT:   Chief Complaint  Patient presents with  . Shortness of Breath   patient is 60 year old Caucasian female with past medical history significant for a history of COPD, asthma, PTSD,  squamous cell cancer of anus, status post colostomy who presents to the hospital with complaints of shortness of breath and wheezing, she was admitted for COPD exacerbation and feels improved now. While in the hospital, she complained of epigastric abdominal pain. CT scan of abdomen and pelvis revealed dilated small bowel loops suggesting ileus or obstruction, left lower quadrant ostomy, pelvic ascites, right pleural effusion, right lower lobe pneumonia. No metastasis was noted in abdomen and pelvis.  Patient feels better overall, was initiated on CLD, had some liquidy stool in colostomy pouch. Diet was advance to FLD, Dr. Burt Knack felt that peristomal hernia could be a problem, relocation of stoma vs colostomy take down was recommended. Patient was noted to be hypoxic today, chest xray pending, will need O2 at home likely.   Review of Systems  Constitutional: Negative for fever, chills and weight loss.  HENT: Negative for congestion.   Eyes: Negative for blurred vision and double vision.  Respiratory: Positive for cough, shortness of breath and wheezing. Negative for sputum production.   Cardiovascular: Negative for chest pain, palpitations, orthopnea, leg swelling and PND.  Gastrointestinal: Positive for abdominal pain. Negative for nausea, vomiting, diarrhea, constipation and blood in stool.  Genitourinary: Negative for dysuria, urgency, frequency and hematuria.  Musculoskeletal: Negative for falls.  Neurological: Negative for dizziness, tremors, focal weakness and headaches.  Endo/Heme/Allergies: Does not bruise/bleed easily.   Psychiatric/Behavioral: Negative for depression. The patient does not have insomnia.     VITAL SIGNS: Blood pressure 163/140, pulse 70, temperature 97.5 F (36.4 C), temperature source Oral, resp. rate 20, height '5\' 6"'$  (1.676 m), weight 87.091 kg (192 lb), SpO2 88 %.  PHYSICAL EXAMINATION:   GENERAL:  60 y.o.-year-old patient lying in the bed with no acute distress.  EYES: Pupils equal, round, reactive to light and accommodation. No scleral icterus. Extraocular muscles intact.  HEENT: Head atraumatic, normocephalic. Oropharynx and nasopharynx clear.  NECK:  Supple, no jugular venous distention. No thyroid enlargement, no tenderness.  LUNGS: [place] entrance bilaterally , scattered wheezing, few rales,rhonchi , but no crepitations. No use of accessory muscles of respiration.  CARDIOVASCULAR: S1, S2 normal. No murmurs, rubs, or gallops.  ABDOMEN: Soft, mildly tender in epigastric area,, mild distention. Bowel sounds present. No organomegaly or mass. Colostomy pouch is  empty EXTREMITIES: No pedal edema, cyanosis, or clubbing.  NEUROLOGIC: Cranial nerves II through XII are intact. Muscle strength 5/5 in all extremities. Sensation intact. Gait not checked.  PSYCHIATRIC: The patient is alert and oriented x 3.  SKIN: No obvious rash, lesion, or ulcer.   ORDERS/RESULTS REVIEWED:   CBC  Recent Labs Lab 02/26/15 1229 02/27/15 0725 03/02/15 0432  WBC 7.0 5.5 6.4  HGB 10.9* 9.7* 10.4*  HCT 32.4* 28.6* 31.6*  PLT 200 172 174  MCV 92.7 93.9 95.6  MCH 31.2 31.7 31.5  MCHC 33.6 33.8 32.9  RDW 16.0* 15.9* 15.8*   ------------------------------------------------------------------------------------------------------------------  Chemistries   Recent Labs Lab 02/26/15 1229 02/27/15 0725  NA 139 137  K 3.6 4.0  CL 105 106  CO2 28 24  GLUCOSE 102* 135*  BUN 11 13  CREATININE 0.80  0.84  CALCIUM 8.5* 8.5*    ------------------------------------------------------------------------------------------------------------------ estimated creatinine clearance is 81.1 mL/min (by C-G formula based on Cr of 0.84). ------------------------------------------------------------------------------------------------------------------ No results for input(s): TSH, T4TOTAL, T3FREE, THYROIDAB in the last 72 hours.  Invalid input(s): FREET3  Cardiac Enzymes No results for input(s): CKMB, TROPONINI, MYOGLOBIN in the last 168 hours.  Invalid input(s): CK ------------------------------------------------------------------------------------------------------------------ Invalid input(s): POCBNP ---------------------------------------------------------------------------------------------------------------  RADIOLOGY: US Venous Img Upper Uni Left  03/02/2015  CLINICAL DATA:  Left upper extremity DVT, subsequent counter EXAM: LEFT UPPER EXTREMITY VENOUS DOPPLER ULTRASOUND TECHNIQUE: Gray-scale sonography with graded compression, as well as color Doppler and duplex ultrasound were performed to evaluate the upper extremity deep venous system from the level of the subclavian vein and including the jugular, axillary, basilic, radial, ulnar and upper cephalic vein. Spectral Doppler was utilized to evaluate flow at rest and with distal augmentation maneuvers. COMPARISON:  10/14/2014 FINDINGS: Contralateral Subclavian Vein: Respiratory phasicity is normal and symmetric with the symptomatic side. No evidence of thrombus. Normal compressibility. Internal Jugular Vein: Previous thrombus has resolved. Normal compressibility, respiratory phasicity and response to augmentation. Subclavian Vein: Previous thrombus has resolved. Normal compressibility, respiratory phasicity and response to augmentation. Axillary Vein: Previous thrombus has resolved. Normal compressibility, respiratory phasicity and response to augmentation. Cephalic Vein: Distal  superficial cephalic vein at the level of the antecubital fossa demonstrates a short segment of residual occlusive thrombus with noncompressibility. Very minor residual thrombus burden. Basilic Vein: No evidence of thrombus. Normal compressibility, respiratory phasicity and response to augmentation. Brachial Veins: Brachial veins are paired. Proximal upper arm brachial vein demonstrates a short segment of residual intraluminal thrombus appearing noncompressible and nearly occlusive. Mid and distal brachial veins are now patent. Radial Veins: No evidence of thrombus. Normal compressibility, respiratory phasicity and response to augmentation. Ulnar Veins: No evidence of thrombus. Normal compressibility, respiratory phasicity and response to augmentation. Venous Reflux:  None visualized. Other Findings:  None visualized. IMPRESSION: Resolved thrombus in the right internal jugular, subclavian and axillary veins. Small amount of residual thrombus in the proximal right brachial vein over a short segment. Very low thrombus burden. Residual superficial thrombosis of the distal cephalic vein across the antecubital fossa, also very low residual thrombus burden. Electronically Signed   By: Jerilynn Mages.  Shick M.D.   On: 03/02/2015 13:07   Dg Abd 2 Views  03/02/2015  CLINICAL DATA:  Recent bowel obstruction EXAM: ABDOMEN - 2 VIEW COMPARISON:  February 28, 2015 FINDINGS: Supine and upright images obtained. Contrast is seen throughout the colon. There is no longer appreciable small bowel dilatation. There are no appreciable air-fluid levels. No free air. There are surgical clips in the pelvis. There are scattered vascular calcifications in the pelvis. There are scattered left-sided colonic diverticula. IMPRESSION: Bowel gas pattern now considered within normal limits. No demonstrable obstruction or free air. Contrast is seen throughout the colon. There are scattered left-sided colonic diverticula. Electronically Signed   By: Lowella Grip III M.D.   On: 03/02/2015 08:10    EKG:  Orders placed or performed during the hospital encounter of 02/26/15  . EKG 12-Lead  . EKG 12-Lead  . ED EKG  . 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 Patient's chest x-ray was unremarkable. However, patient's CT scan revealed right-sided pneumonia, concerning for aspiration pneumonitis, continue doxycycline to complete course #2. Aspiration pneumonitis, continue doxycycline  #3. Small bowel obstruction, ?due to adhesions versus pseudoobstruction due to constipation versus peristomal hernia, per surgery, continue full liquid diet, follow clincally #4 anemia, get Hemoccult, there is no active bleeding, follow hemoglobin level in the morning  #5. Constipation, continue patient on docusate, MiraLAX, suppositories, had stool today #6. Mild hemoptysis, likely due to aspiration pneumonitis, follow closely. The Patient is on Coumadin for LUE DVT, which is imroving, but she is sub therapeutic, follow protime in am #7 acute respiratory failure, off IVF, follow chest xray in am, O2 for home, d/w care manager  Management plans discussed with the patient, family and they are in agreement.   DRUG ALLERGIES:  Allergies  Allergen Reactions  . Sulfa Antibiotics Hives    CODE STATUS:     Code Status Orders        Start     Ordered   02/26/15 1744  Full code   Continuous     02/26/15 1743    Code Status History    Date Active Date Inactive Code Status Order ID Comments User Context   10/14/2014 10:59 PM 10/17/2014  4:34 PM  Full Code [id number]  Lytle Butte, MD ED   05/17/2014  8:38 PM 05/30/2014  4:55 PM Full Code [id number]  Fritzi Mandes, MD Inpatient    Advance Directive Documentation        Most Recent Value   Type of Advance Directive  Healthcare Power of Attorney   Pre-existing out of facility DNR order (yellow form or pink MOST form)     "MOST" Form in Place?        TOTAL TIME TAKING CARE OF THIS PATIENT: 35 minutes.    Theodoro Grist M.D on 03/02/2015 at 6:59 PM  Between 7am to 6pm - Pager - [phone]  After 6pm go to [email or web address] - password [email or web address] [facility]  Office  [phone]  CC: Primary care physician; [name], NP

## 2015-03-03 ENCOUNTER — Inpatient Hospital Stay: Payer: Commercial Managed Care - HMO

## 2015-03-03 LAB — FERRITIN: Ferritin: 136 ng/mL (ref 11–307)

## 2015-03-03 LAB — IRON AND TIBC
Iron: 43 ug/dL (ref 28–170)
Saturation Ratios: 13 % (ref 10.4–31.8)
TIBC: 328 ug/dL (ref 250–450)
UIBC: 285 ug/dL

## 2015-03-03 LAB — RETICULOCYTES
RBC.: 3.11 MIL/uL — ABNORMAL LOW (ref 3.80–5.20)
Retic Count, Absolute: 96.4 10*3/uL (ref 19.0–183.0)
Retic Ct Pct: 3.1 % (ref 0.4–3.1)

## 2015-03-03 LAB — PROTIME-INR
INR: 2.19
PROTHROMBIN TIME: 24.2 s — AB (ref 11.4–15.0)

## 2015-03-03 NOTE — Consult Note (Signed)
ANTICOAGULATION CONSULT NOTE - follow up [place] for warfarin Indication: VTE prophylaxis, hx DVT upper extremity  Allergies  Allergen Reactions  . Sulfa Antibiotics Hives    Patient Measurements: Height: '5\' 6"'$  (167.6 cm) Weight: 192 lb (87.091 kg) IBW/kg (Calculated) : 59.3 Heparin Dosing Weight:   Vital Signs: Temp: 98.1 F (36.7 C) (02/25 0540) Temp Source: Oral (02/25 0540) BP: 131/64 mmHg (02/25 0540) Pulse Rate: 69 (02/25 0540)  Labs:  Recent Labs  03/01/15 0409 03/02/15 0432 03/03/15 0532  HGB  --  10.4*  --   HCT  --  31.6*  --   PLT  --  174  --   LABPROT 18.1* 20.5* 24.2*  INR 1.49 1.76 2.19    Estimated Creatinine Clearance: 81.1 mL/min (by C-G formula based on Cr of 0.84).   Medical History: Past Medical History  Diagnosis Date  . Schizophrenia ([place])   . Asthma   . GERD (gastroesophageal reflux disease)   . Anxiety   . Depression   . Bipolar disorder ([place])   . COPD (chronic obstructive pulmonary disease) ([place])   . Occasional tremors     right hand  . PTSD (post-traumatic stress disorder)   . Shortness of breath dyspnea   . Fibromyalgia   . DVT (deep venous thrombosis) ([place]) 2011    RUE  . Thyroid nodule   . DDD (degenerative disc disease), lumbar   . Spinal stenosis   . Peripheral neuropathy ([place])   . Rotator cuff tear     right  . Pneumonia 2011  . Hypothyroidism     no meds currently  . Anemia     during pregnancy only  . Hypertension     Off meds x 15 years-well controlled now per pt  . Squamous cell cancer, anus (HCC)   . Severe obstructive sleep apnea 06/27/2014  . DVT of upper extremity (deep vein thrombosis) ([place]) 10/14/2014    Medications:  Scheduled:  . ARIPiprazole  2 mg Oral Daily  . baclofen  10 mg Oral TID  . budesonide-formoterol  2 puff Inhalation BID  . calcium-vitamin D  1 tablet Oral BID  . docusate sodium  100 mg Oral Daily  . doxycycline  100 mg Oral Q12H  . DULoxetine  60 mg Oral Daily   . nicotine  21 mg Transdermal Daily  . oxybutynin  5 mg Oral BID  . pantoprazole  40 mg Oral Daily  . polyethylene glycol  17 g Oral Daily  . predniSONE  60 mg Oral Q breakfast  . pregabalin  200 mg Oral BID  . tiotropium  18 mcg Inhalation Daily  . warfarin  8 mg Oral q1800  . Warfarin - Pharmacist Dosing Inpatient   Does not apply q1800    Assessment: Pt is a 60 year old female with a recent DVT taking warfarin at home. Home dose per patient is '8mg'$  daily. Pt had held her warfarin from 2/10 to 2/17 to have an edoscopy/colonoscopy but was unable to have the procedure. Pt resumed warfarin on the 17th. Today 2/20 her INR is 1.13.  2/20  INR = 1.13   Warfarin 8 mg 2/21  INR = 1.14   Warfarin 10 mg 2/22  INR = 1.39   Warfarin 8 mg 2/23  INR = 1.49   Warfarin 8 mg 2/24  INR = 1.76   No dose of warfarin charted 2/25  INR = 2.19  Goal of Therapy:  INR 2-3 Monitor platelets  by anticoagulation protocol: Yes   Plan:  INR therapeutic today, however no dose of warfarin charted on MAR for last night.  Will continue Warfarin 8 mg and  f/u AM INR.  Roe Coombs, PharmD Pharmacy Resident 03/03/2015

## 2015-03-03 NOTE — Care Management Note (Signed)
Case Management Note  Patient Details  Name: [name] MRN: [id number] Date of Birth: 1955-08-20  Subjective/Objective:   A home use nebulizer was provided to [name] by [place] [facility]. [name] reports that she has had a nebulizer in the past and knows how to use it.                Action/Plan:   Expected Discharge Date:  03/01/15               Expected Discharge Plan:     In-House Referral:     Discharge planning Services     Post Acute Care Choice:    Choice offered to:     DME Arranged:    DME Agency:     HH Arranged:    HH Agency:     Status of Service:     Medicare Important Message Given:  Yes Date Medicare IM Given:    Medicare IM give by:    Date Additional Medicare IM Given:    Additional Medicare Important Message give by:     If discussed at [place] of Stay Meetings, dates discussed:    Additional Comments:  Deira Shimer A, RN 03/03/2015, 1:22 PM

## 2015-03-03 NOTE — Progress Notes (Addendum)
MD making rounds. Discharge orders received. Unable to schedule Appointment due to Saturday. IV removed. Prescriptions given to patient. Also, Prescriptions E-Scribed. Discharge paperwork provided, explained, signed and witnessed. Education handouts provided to patient. No unanswered questions. Escorted via wheelchair by nursing staff. All belongings sent with patient and family.

## 2015-03-03 NOTE — Progress Notes (Addendum)
[facility]  Date of admission:  02/26/2015  Inpatient day:  03/02/2015  Consulting physician: Dr. Theodoro Grist  Reason for Consultation:  Abdominal pain with what looks small bowel obstruction on CT scan  Chief Complaint: [name] is a 60 y.o. 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  Subjective:  Feeling much better.  Stool in ostomy emptied several times.  Advancing diet.  Past Medical History  Diagnosis Date  . Schizophrenia ([place])   . Asthma   . GERD (gastroesophageal reflux disease)   . Anxiety   . Depression   . Bipolar disorder ([place])   . COPD (chronic obstructive pulmonary disease) ([place])   . Occasional tremors     right hand  . PTSD (post-traumatic stress disorder)   . Shortness of breath dyspnea   . Fibromyalgia   . DVT (deep venous thrombosis) ([place]) 2011    RUE  . Thyroid nodule   . DDD (degenerative disc disease), lumbar   . Spinal stenosis   . Peripheral neuropathy ([place])   . Rotator cuff tear     right  . Pneumonia 2011  . Hypothyroidism     no meds currently  . Anemia     during pregnancy only  . Hypertension     Off meds x 15 years-well controlled now per pt  . Squamous cell cancer, anus ([facility])   . Severe obstructive sleep apnea 06/27/2014  . DVT of upper extremity (deep vein thrombosis) ([place]) 10/14/2014    Past Surgical History  Procedure Laterality Date  . Foot surgery Right   . Tubal ligation    . Eye surgery Bilateral   . Mouth surgery  2002  . Rectal biopsy N/A 05/08/2014    Procedure: BIOPSY RECTAL;  Surgeon: Marlyce Huge, MD;  Location: [facility] ORS;  Service: General;  Laterality: N/A;  . Evaluation under anesthesia with hemorrhoidectomy N/A 05/08/2014    Procedure: EXAM UNDER ANESTHESIA WITH HEMORRHOIDECTOMY;  Surgeon: Marlyce Huge, MD;  Location: [facility] ORS;  Service: General;   Laterality: N/A;  . Portacath placement N/A 05/20/2014    Procedure: INSERTION PORT-A-CATH;  Surgeon: Florene Glen, MD;  Location: [facility] ORS;  Service: General;  Laterality: N/A;  . Laparoscopic diverted colostomy N/A 05/26/2014    Procedure: LAPAROSCOPIC DIVERTED COLOSTOMY;  Surgeon: Marlyce Huge, MD;  Location: [facility] ORS;  Service: General;  Laterality: N/A;  . Peripheral vascular catheterization Left 10/16/2014    Procedure: Upper Extremity Venography with thrombectomy, port removal;  Surgeon: Algernon Huxley, MD;  Location: [place] [facility];  Service: Cardiovascular;  Laterality: Left;  . Peripheral vascular catheterization  10/16/2014    Procedure: Upper Extremity Intervention;  Surgeon: Algernon Huxley, MD;  Location: [place] [facility];  Service: Cardiovascular;;    Family History  Problem Relation Age of Onset  . Cancer Maternal Aunt   . Cancer Paternal Uncle   . Diabetes Mother   . Thyroid disease Mother   . Kidney failure Mother   . Hypertension Mother   . Depression Mother   . Mental illness Son   . Aneurysm Maternal Grandmother   . Thyroid disease Maternal Grandmother   . Stroke Maternal Grandfather   . Hypertension Maternal Grandfather   . Diabetes Maternal Grandfather   . Heart disease Maternal Grandfather     MI  . Nephrolithiasis Daughter     Social History:  reports that she has been smoking Cigarettes.  She started smoking about 44 years ago. She has a 11 pack-year smoking history. She has never used smokeless tobacco. She reports that she uses illicit drugs (Marijuana). She reports that she does not drink alcohol.  The patient is alone today.  Allergies:  Allergies  Allergen Reactions  . Sulfa Antibiotics Hives    Medications Prior to Admission  Medication Sig Dispense Refill  . albuterol (PROVENTIL HFA;VENTOLIN HFA) 108 (90 BASE) MCG/ACT inhaler Inhale 2 puffs into the lungs every 6 (six) hours as needed for wheezing or shortness of breath.     .  ARIPiprazole (ABILIFY) 2 MG tablet Take 1 tablet (2 mg total) by mouth daily. 30 tablet 4  . budesonide-formoterol (SYMBICORT) 160-4.5 MCG/ACT inhaler Inhale 2 puffs into the lungs 2 (two) times daily. 1 Inhaler 12  . Calcium Carb-Cholecalciferol (CALCIUM 600+D) 600-800 MG-UNIT TABS Take 1 tablet by mouth 2 (two) times daily.     . clonazePAM (KLONOPIN) 1 MG tablet Take 1 tablet (1 mg total) by mouth 3 (three) times daily as needed for anxiety. 90 tablet 4  . docusate sodium (COLACE) 100 MG capsule Take 100 mg by mouth daily.    . DULoxetine (CYMBALTA) 60 MG capsule Take 60 mg by mouth at bedtime.    Marland Kitchen escitalopram (LEXAPRO) 20 MG tablet Take 1 tablet (20 mg total) by mouth daily. 30 tablet 4  . oxyCODONE-acetaminophen (PERCOCET) 10-325 MG tablet Take 1 tablet by mouth every 6 (six) hours as needed for pain. 60 tablet 0  . pantoprazole (PROTONIX) 40 MG tablet Take 40 mg by mouth 2 (two) times daily.     . pregabalin (LYRICA) 200 MG capsule Take 1 capsule (200 mg total) by mouth 2 (two) times daily. 90 capsule 6  . tiotropium (SPIRIVA) 18 MCG inhalation capsule Place 1 capsule (18 mcg total) into inhaler and inhale daily. 30 capsule 12  . warfarin (COUMADIN) 3 MG tablet Take 3 mg by mouth every evening. Pt takes with a '5mg'$  tablet.    . warfarin (COUMADIN) 5 MG tablet Take 5 mg by mouth every evening. Pt takes with a '3mg'$  tablet.    Marland Kitchen zolpidem (AMBIEN) 10 MG tablet Take 1 tablet (10 mg total) by mouth at bedtime as needed for sleep. 30 tablet 4  . baclofen (LIORESAL) 10 MG tablet Take 1 tablet (10 mg total) by mouth 3 (three) times daily. (Patient not taking: Reported on 02/28/2015) 90 each 6    Review of Systems: GENERAL:  Feels much better.  No fevers, sweats or weight loss. PERFORMANCE STATUS (ECOG):  1 HEENT:  No visual changes, runny nose, sore throat, mouth sores or tenderness. Lungs: Cough.  Shortness of breath , improved.  Scant hemoptysis. Cardiac:  No chest pain, palpitations, orthopnea,  or PND. GI:  Abdominal pain above ostomy, slightly better.  Liquid brown stool output.  No nausea or vomiting. h/o blood in ostomy. GU:  No urgency, frequency, dysuria, or hematuria. Musculoskeletal:  No back pain.  No joint pain.  No muscle tenderness. Extremities:  No pain or swelling. Skin:  No rashes or skin changes. Neuro:  No headache, numbness or weakness, balance or coordination issues. Endocrine:  No diabetes, thyroid issues, hot flashes or night sweats. Psych:  No mood changes, depression or anxiety. Pain:  No focal pain. Review of systems:  All other systems reviewed and found to be negative.  Physical Exam:  Blood pressure 131/64, pulse 69, temperature 98.1 F (36.7  C), temperature source Oral, resp. rate 18, height '5\' 6"'$  (1.676 m), weight 192 lb (87.091 kg), SpO2 98 %.  GENERAL:  Well developed, well nourished, woman watching TV on the medical unit with a tray full of liquids. MENTAL STATUS:  Alert and oriented to person, place and time. HEAD:  Long gray hair.  Normocephalic, atraumatic, face symmetric, no Cushingoid features. EYES:  Blue eyes.  Pupils equal round and reactive to light and accomodation.  No conjunctivitis or scleral icterus. ENT:  Oropharynx clear without lesion.  Tongue normal. Mucous membranes moist.  RESPIRATORY:  Decreased breath sounds right base.  No rales, wheezes, or rhonchi. CARDIOVASCULAR:  Regular rate and rhythm without murmur, rub or gallop. ABDOMEN:  Left sided ostomy with slight tenderness above site.  Liquid brown stool in bag.  No guarding or rebound tenderness.  Active bowel sounds.  No hepatosplenomegaly.  No masses. SKIN:  No rashes, ulcers or lesions. EXTREMITIES: No edema, no skin discoloration or tenderness.  No palpable cords. LYMPH NODES: No palpable cervical, supraclavicular, axillary or inguinal adenopathy  NEUROLOGICAL: Unremarkable. PSYCH:  Appropriate.  Results for orders placed or performed during the hospital encounter of  02/26/15 (from the past 48 hour(s))  Protime-INR     Status: Abnormal   Collection Time: 03/02/15  4:32 AM  Result Value Ref Range   Prothrombin Time 20.5 (H) 11.4 - 15.0 seconds   INR 1.76   CBC     Status: Abnormal   Collection Time: 03/02/15  4:32 AM  Result Value Ref Range   WBC 6.4 3.6 - 11.0 K/uL   RBC 3.31 (L) 3.80 - 5.20 MIL/uL   Hemoglobin 10.4 (L) 12.0 - 16.0 g/dL   HCT 31.6 (L) 35.0 - 47.0 %   MCV 95.6 80.0 - 100.0 fL   MCH 31.5 26.0 - 34.0 pg   MCHC 32.9 32.0 - 36.0 g/dL   RDW 15.8 (H) 11.5 - 14.5 %   Platelets 174 150 - 440 K/uL  Protime-INR     Status: Abnormal   Collection Time: 03/03/15  5:32 AM  Result Value Ref Range   Prothrombin Time 24.2 (H) 11.4 - 15.0 seconds   INR 2.19   Ferritin     Status: None   Collection Time: 03/03/15  5:32 AM  Result Value Ref Range   Ferritin 136 11 - 307 ng/mL  Iron and TIBC     Status: None   Collection Time: 03/03/15  5:32 AM  Result Value Ref Range   Iron 43 28 - 170 ug/dL   TIBC 328 250 - 450 ug/dL   Saturation Ratios 13 10.4 - 31.8 %   UIBC 285 ug/dL  Reticulocytes     Status: Abnormal   Collection Time: 03/03/15  5:32 AM  Result Value Ref Range   Retic Ct Pct 3.1 0.4 - 3.1 %   RBC. 3.11 (L) 3.80 - 5.20 MIL/uL   Retic Count, Manual 96.4 19.0 - 183.0 K/uL   US Venous Img Upper Uni Left  03/02/2015  CLINICAL DATA:  Left upper extremity DVT, subsequent counter EXAM: LEFT UPPER EXTREMITY VENOUS DOPPLER ULTRASOUND TECHNIQUE: Gray-scale sonography with graded compression, as well as color Doppler and duplex ultrasound were performed to evaluate the upper extremity deep venous system from the level of the subclavian vein and including the jugular, axillary, basilic, radial, ulnar and upper cephalic vein. Spectral Doppler was utilized to evaluate flow at rest and with distal augmentation maneuvers. COMPARISON:  10/14/2014 FINDINGS: Contralateral Subclavian Vein: Respiratory  phasicity is normal and symmetric with the symptomatic  side. No evidence of thrombus. Normal compressibility. Internal Jugular Vein: Previous thrombus has resolved. Normal compressibility, respiratory phasicity and response to augmentation. Subclavian Vein: Previous thrombus has resolved. Normal compressibility, respiratory phasicity and response to augmentation. Axillary Vein: Previous thrombus has resolved. Normal compressibility, respiratory phasicity and response to augmentation. Cephalic Vein: Distal superficial cephalic vein at the level of the antecubital fossa demonstrates a short segment of residual occlusive thrombus with noncompressibility. Very minor residual thrombus burden. Basilic Vein: No evidence of thrombus. Normal compressibility, respiratory phasicity and response to augmentation. Brachial Veins: Brachial veins are paired. Proximal upper arm brachial vein demonstrates a short segment of residual intraluminal thrombus appearing noncompressible and nearly occlusive. Mid and distal brachial veins are now patent. Radial Veins: No evidence of thrombus. Normal compressibility, respiratory phasicity and response to augmentation. Ulnar Veins: No evidence of thrombus. Normal compressibility, respiratory phasicity and response to augmentation. Venous Reflux:  None visualized. Other Findings:  None visualized. IMPRESSION: Resolved thrombus in the right internal jugular, subclavian and axillary veins. Small amount of residual thrombus in the proximal right brachial vein over a short segment. Very low thrombus burden. Residual superficial thrombosis of the distal cephalic vein across the antecubital fossa, also very low residual thrombus burden. Electronically Signed   By: Jerilynn Mages.  Shick M.D.   On: 03/02/2015 13:07   Dg Chest Port 1 View  03/03/2015  CLINICAL DATA:  60 year old female with aspiration pneumonia. EXAM: PORTABLE CHEST 1 VIEW COMPARISON:  02/26/2015 and prior chest radiographs FINDINGS: This is a low volume film. Subsegmental atelectasis in the right  perihilar region and left lung base noted. There is no evidence of edema, pneumothorax or definite pleural effusion. No acute bony abnormalities are present. IMPRESSION: Low volume film with new subsegmental atelectasis in the right perihilar and left basilar regions. Electronically Signed   By: Margarette Canada M.D.   On: 03/03/2015 08:14   Dg Abd 2 Views  03/02/2015  CLINICAL DATA:  Recent bowel obstruction EXAM: ABDOMEN - 2 VIEW COMPARISON:  February 28, 2015 FINDINGS: Supine and upright images obtained. Contrast is seen throughout the colon. There is no longer appreciable small bowel dilatation. There are no appreciable air-fluid levels. No free air. There are surgical clips in the pelvis. There are scattered vascular calcifications in the pelvis. There are scattered left-sided colonic diverticula. IMPRESSION: Bowel gas pattern now considered within normal limits. No demonstrable obstruction or free air. Contrast is seen throughout the colon. There are scattered left-sided colonic diverticula. Electronically Signed   By: Lowella Grip III M.D.   On: 03/02/2015 08:10    Assessment:  The patient is a 60 y.o. 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  She has a history of left upper extremity DVT on 10/14/2014. She underwent thrombolysis, thrombectomy, and port removal on 10/16/2014. She is on Coumadin.  Plan:   1.  Hematology/Oncology:  Patient had an excellent response to treatment.  No evidence of residual disease.  Patient has K27 and folic acid deficiency on supplimentation.  She is on folic acid 1 mg a day as an outpatient (recently ran out).  She receives B12 monthly in clinic.  Iron studies adequate.  Left upper extremity DVT caused by port-a-cath.  Port was removed.  Repeat ultrasound revealed resolved thrombus in the right internal jugular, subclavian and axillary veins.   There  was a  small amount of residual thrombus in a short segment of the proximal right brachial vein (low residual thrombus burden).  There was superficial thrombus of the disal cephalic vein across the antecubital fossa (very low thrombus burden).  Patient has been on anticoagulation x 4 months.  Suspect anticoagulation can be discontinued if ok'd by Dr. Lucky Cowboy (message left).   Anticipate follow-up in clinic early next week.  2.  Pulmonary:  COPD flare with associated RLL pneumonia, improved.  Patient on doxycycline, oral prednisone taper, and Proventil.  3.  Gastroenterology:  Small bowel obstruction, resolved.  Stool output.  KUB normal.  Advancing diet.  Surgery considering elective ostomy reversal or correction of parastomal hernia in future.  4.  Disposition:  Anticipate discharge in next 1-2 days.   Thank you for allowing me to participate in [name] 's care.  I will follow her closely with you while hospitalized and after discharge in the outpatient department.  Lequita Asal, MD  03/02/2015

## 2015-03-03 NOTE — Progress Notes (Signed)
[facility]  Date of admission:  02/26/2015  Inpatient day:  03/02/2015  Consulting physician: Dr. Theodoro Grist  Reason for Consultation:  Abdominal pain with what looks small bowel obstruction on CT scan  Chief Complaint: [name] is a 60 y.o. 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  Subjective: Patient feels significantly improved and nearly back to her baseline. She is tolerating a clear liquid diet and no longer complains of shortness of breath. Patient offers no specific complaints today and expresses a desire to go home.  Past Medical History  Diagnosis Date  . Schizophrenia ([place])   . Asthma   . GERD (gastroesophageal reflux disease)   . Anxiety   . Depression   . Bipolar disorder ([place])   . COPD (chronic obstructive pulmonary disease) ([place])   . Occasional tremors     right hand  . PTSD (post-traumatic stress disorder)   . Shortness of breath dyspnea   . Fibromyalgia   . DVT (deep venous thrombosis) ([place]) 2011    RUE  . Thyroid nodule   . DDD (degenerative disc disease), lumbar   . Spinal stenosis   . Peripheral neuropathy ([place])   . Rotator cuff tear     right  . Pneumonia 2011  . Hypothyroidism     no meds currently  . Anemia     during pregnancy only  . Hypertension     Off meds x 15 years-well controlled now per pt  . Squamous cell cancer, anus (H[facility])   . Severe obstructive sleep apnea 06/27/2014  . DVT of upper extremity (deep vein thrombosis) ([place]) 10/14/2014    Past Surgical History  Procedure Laterality Date  . Foot surgery Right   . Tubal ligation    . Eye surgery Bilateral   . Mouth surgery  2002  . Rectal biopsy N/A 05/08/2014    Procedure: BIOPSY RECTAL;  Surgeon: Marlyce Huge, MD;  Location: [facility] ORS;  Service: General;  Laterality: N/A;  . Evaluation under anesthesia with hemorrhoidectomy N/A  05/08/2014    Procedure: EXAM UNDER ANESTHESIA WITH HEMORRHOIDECTOMY;  Surgeon: Marlyce Huge, MD;  Location: [facility] ORS;  Service: General;  Laterality: N/A;  . Portacath placement N/A 05/20/2014    Procedure: INSERTION PORT-A-CATH;  Surgeon: Florene Glen, MD;  Location: [facility] ORS;  Service: General;  Laterality: N/A;  . Laparoscopic diverted colostomy N/A 05/26/2014    Procedure: LAPAROSCOPIC DIVERTED COLOSTOMY;  Surgeon: Marlyce Huge, MD;  Location: [facility] ORS;  Service: General;  Laterality: N/A;  . Peripheral vascular catheterization Left 10/16/2014    Procedure: Upper Extremity Venography with thrombectomy, port removal;  Surgeon: Algernon Huxley, MD;  Location: [place] [facility];  Service: Cardiovascular;  Laterality: Left;  . Peripheral vascular catheterization  10/16/2014    Procedure: Upper Extremity Intervention;  Surgeon: Algernon Huxley, MD;  Location: [place] [facility];  Service: Cardiovascular;;    Family History  Problem Relation Age of Onset  . Cancer Maternal Aunt   . Cancer Paternal Uncle   . Diabetes Mother   . Thyroid disease Mother   . Kidney failure Mother   . Hypertension Mother   . Depression Mother   . Mental illness Son   . Aneurysm Maternal Grandmother   . Thyroid disease Maternal Grandmother   . Stroke Maternal Grandfather   . Hypertension Maternal Grandfather   . Diabetes Maternal  Grandfather   . Heart disease Maternal Grandfather     MI  . Nephrolithiasis Daughter     Social History:  reports that she has been smoking Cigarettes.  She started smoking about 44 years ago. She has a 11 pack-year smoking history. She has never used smokeless tobacco. She reports that she uses illicit drugs (Marijuana). She reports that she does not drink alcohol.  The patient is alone today.  Allergies:  Allergies  Allergen Reactions  . Sulfa Antibiotics Hives    Medications Prior to Admission  Medication Sig Dispense Refill  . albuterol (PROVENTIL  HFA;VENTOLIN HFA) 108 (90 BASE) MCG/ACT inhaler Inhale 2 puffs into the lungs every 6 (six) hours as needed for wheezing or shortness of breath.     . ARIPiprazole (ABILIFY) 2 MG tablet Take 1 tablet (2 mg total) by mouth daily. 30 tablet 4  . budesonide-formoterol (SYMBICORT) 160-4.5 MCG/ACT inhaler Inhale 2 puffs into the lungs 2 (two) times daily. 1 Inhaler 12  . Calcium Carb-Cholecalciferol (CALCIUM 600+D) 600-800 MG-UNIT TABS Take 1 tablet by mouth 2 (two) times daily.     . clonazePAM (KLONOPIN) 1 MG tablet Take 1 tablet (1 mg total) by mouth 3 (three) times daily as needed for anxiety. 90 tablet 4  . docusate sodium (COLACE) 100 MG capsule Take 100 mg by mouth daily.    . DULoxetine (CYMBALTA) 60 MG capsule Take 60 mg by mouth at bedtime.    Marland Kitchen escitalopram (LEXAPRO) 20 MG tablet Take 1 tablet (20 mg total) by mouth daily. 30 tablet 4  . oxyCODONE-acetaminophen (PERCOCET) 10-325 MG tablet Take 1 tablet by mouth every 6 (six) hours as needed for pain. 60 tablet 0  . pantoprazole (PROTONIX) 40 MG tablet Take 40 mg by mouth 2 (two) times daily.     . pregabalin (LYRICA) 200 MG capsule Take 1 capsule (200 mg total) by mouth 2 (two) times daily. 90 capsule 6  . tiotropium (SPIRIVA) 18 MCG inhalation capsule Place 1 capsule (18 mcg total) into inhaler and inhale daily. 30 capsule 12  . warfarin (COUMADIN) 3 MG tablet Take 3 mg by mouth every evening. Pt takes with a '5mg'$  tablet.    . warfarin (COUMADIN) 5 MG tablet Take 5 mg by mouth every evening. Pt takes with a '3mg'$  tablet.    Marland Kitchen zolpidem (AMBIEN) 10 MG tablet Take 1 tablet (10 mg total) by mouth at bedtime as needed for sleep. 30 tablet 4  . baclofen (LIORESAL) 10 MG tablet Take 1 tablet (10 mg total) by mouth 3 (three) times daily. (Patient not taking: Reported on 02/28/2015) 90 each 6    Review of Systems: GENERAL:  Feels much better.  No fevers, sweats or weight loss. PERFORMANCE STATUS (ECOG):  0 HEENT:  No visual changes, runny nose, sore  throat, mouth sores or tenderness. Lungs: Cough.  Shortness of breath , improved.  Scant hemoptysis. Cardiac:  No chest pain, palpitations, orthopnea, or PND. GI:  Abdominal pain above ostomy, slightly better.  Liquid brown stool output.  No nausea or vomiting. h/o blood in ostomy. GU:  No urgency, frequency, dysuria, or hematuria. Musculoskeletal:  No back pain.  No joint pain.  No muscle tenderness. Extremities:  No pain or swelling. Skin:  No rashes or skin changes. Neuro:  No headache, numbness or weakness, balance or coordination issues. Endocrine:  No diabetes, thyroid issues, hot flashes or night sweats. Psych:  No mood changes, depression or anxiety. Pain:  No focal pain. Review of  systems:  All other systems reviewed and found to be negative.  Physical Exam:  Blood pressure 107/52, pulse 73, temperature 98 F (36.7 C), temperature source Oral, resp. rate 18, height '5\' 6"'$  (1.676 m), weight 192 lb (87.091 kg), SpO2 100 %.  GENERAL:  Well developed, well nourished, woman watching TV on the medical unit with a tray full of liquids. MENTAL STATUS:  Alert and oriented to person, place and time. HEAD:  Long gray hair.  Normocephalic, atraumatic, face symmetric, no Cushingoid features. RESPIRATORY:  Decreased breath sounds right base.  No rales, wheezes, or rhonchi. CARDIOVASCULAR:  Regular rate and rhythm without murmur, rub or gallop. ABDOMEN:  Left sided ostomy with slight tenderness above site.  Liquid brown stool in bag.  No guarding or rebound tenderness.  Active bowel sounds.  No hepatosplenomegaly.  No masses. SKIN:  No rashes, ulcers or lesions. EXTREMITIES: No edema, no skin discoloration or tenderness.  No palpable cords. NEUROLOGICAL: Unremarkable. PSYCH:  Appropriate.  Results for orders placed or performed during the hospital encounter of 02/26/15 (from the past 48 hour(s))  Protime-INR     Status: Abnormal   Collection Time: 03/02/15  4:32 AM  Result Value Ref Range    Prothrombin Time 20.5 (H) 11.4 - 15.0 seconds   INR 1.76   CBC     Status: Abnormal   Collection Time: 03/02/15  4:32 AM  Result Value Ref Range   WBC 6.4 3.6 - 11.0 K/uL   RBC 3.31 (L) 3.80 - 5.20 MIL/uL   Hemoglobin 10.4 (L) 12.0 - 16.0 g/dL   HCT 31.6 (L) 35.0 - 47.0 %   MCV 95.6 80.0 - 100.0 fL   MCH 31.5 26.0 - 34.0 pg   MCHC 32.9 32.0 - 36.0 g/dL   RDW 15.8 (H) 11.5 - 14.5 %   Platelets 174 150 - 440 K/uL  Protime-INR     Status: Abnormal   Collection Time: 03/03/15  5:32 AM  Result Value Ref Range   Prothrombin Time 24.2 (H) 11.4 - 15.0 seconds   INR 2.19   Ferritin     Status: None   Collection Time: 03/03/15  5:32 AM  Result Value Ref Range   Ferritin 136 11 - 307 ng/mL  Iron and TIBC     Status: None   Collection Time: 03/03/15  5:32 AM  Result Value Ref Range   Iron 43 28 - 170 ug/dL   TIBC 328 250 - 450 ug/dL   Saturation Ratios 13 10.4 - 31.8 %   UIBC 285 ug/dL  Reticulocytes     Status: Abnormal   Collection Time: 03/03/15  5:32 AM  Result Value Ref Range   Retic Ct Pct 3.1 0.4 - 3.1 %   RBC. 3.11 (L) 3.80 - 5.20 MIL/uL   Retic Count, Manual 96.4 19.0 - 183.0 K/uL   US Venous Img Upper Uni Left  03/02/2015  CLINICAL DATA:  Left upper extremity DVT, subsequent counter EXAM: LEFT UPPER EXTREMITY VENOUS DOPPLER ULTRASOUND TECHNIQUE: Gray-scale sonography with graded compression, as well as color Doppler and duplex ultrasound were performed to evaluate the upper extremity deep venous system from the level of the subclavian vein and including the jugular, axillary, basilic, radial, ulnar and upper cephalic vein. Spectral Doppler was utilized to evaluate flow at rest and with distal augmentation maneuvers. COMPARISON:  10/14/2014 FINDINGS: Contralateral Subclavian Vein: Respiratory phasicity is normal and symmetric with the symptomatic side. No evidence of thrombus. Normal compressibility. Internal Jugular Vein: Previous thrombus  has resolved. Normal compressibility,  respiratory phasicity and response to augmentation. Subclavian Vein: Previous thrombus has resolved. Normal compressibility, respiratory phasicity and response to augmentation. Axillary Vein: Previous thrombus has resolved. Normal compressibility, respiratory phasicity and response to augmentation. Cephalic Vein: Distal superficial cephalic vein at the level of the antecubital fossa demonstrates a short segment of residual occlusive thrombus with noncompressibility. Very minor residual thrombus burden. Basilic Vein: No evidence of thrombus. Normal compressibility, respiratory phasicity and response to augmentation. Brachial Veins: Brachial veins are paired. Proximal upper arm brachial vein demonstrates a short segment of residual intraluminal thrombus appearing noncompressible and nearly occlusive. Mid and distal brachial veins are now patent. Radial Veins: No evidence of thrombus. Normal compressibility, respiratory phasicity and response to augmentation. Ulnar Veins: No evidence of thrombus. Normal compressibility, respiratory phasicity and response to augmentation. Venous Reflux:  None visualized. Other Findings:  None visualized. IMPRESSION: Resolved thrombus in the right internal jugular, subclavian and axillary veins. Small amount of residual thrombus in the proximal right brachial vein over a short segment. Very low thrombus burden. Residual superficial thrombosis of the distal cephalic vein across the antecubital fossa, also very low residual thrombus burden. Electronically Signed   By: Jerilynn Mages.  Shick M.D.   On: 03/02/2015 13:07   Dg Chest Port 1 View  03/03/2015  CLINICAL DATA:  60 year old female with aspiration pneumonia. EXAM: PORTABLE CHEST 1 VIEW COMPARISON:  02/26/2015 and prior chest radiographs FINDINGS: This is a low volume film. Subsegmental atelectasis in the right perihilar region and left lung base noted. There is no evidence of edema, pneumothorax or definite pleural effusion. No acute bony  abnormalities are present. IMPRESSION: Low volume film with new subsegmental atelectasis in the right perihilar and left basilar regions. Electronically Signed   By: Margarette Canada M.D.   On: 03/03/2015 08:14   Dg Abd 2 Views  03/02/2015  CLINICAL DATA:  Recent bowel obstruction EXAM: ABDOMEN - 2 VIEW COMPARISON:  February 28, 2015 FINDINGS: Supine and upright images obtained. Contrast is seen throughout the colon. There is no longer appreciable small bowel dilatation. There are no appreciable air-fluid levels. No free air. There are surgical clips in the pelvis. There are scattered vascular calcifications in the pelvis. There are scattered left-sided colonic diverticula. IMPRESSION: Bowel gas pattern now considered within normal limits. No demonstrable obstruction or free air. Contrast is seen throughout the colon. There are scattered left-sided colonic diverticula. Electronically Signed   By: Lowella Grip III M.D.   On: 03/02/2015 08:10    Assessment:  The patient is a 60 y.o. 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  She has a history of left upper extremity DVT on 10/14/2014. She underwent thrombolysis, thrombectomy, and port removal on 10/16/2014. She is on Coumadin.  Plan:   1.  Hematology/Oncology:  Patient had an excellent response to treatment.  No evidence of residual disease.  She has been instructed to keep her follow-up appointment with Dr. Mike Gip as previously scheduled.  Patient has O03 and folic acid deficiency on supplimentation.  She is on folic acid 1 mg a day as an outpatient (recently ran out).  She receives B12 monthly in clinic.  Iron studies adequate.  Left upper extremity DVT caused by port-a-cath.  Port was removed.  Repeat ultrasound revealed resolved thrombus in the right internal jugular, subclavian and axillary veins.   There was a small amount of  residual  thrombus in a short segment of the proximal right brachial vein (low residual thrombus burden).  There was superficial thrombus of the disal cephalic vein across the antecubital fossa (very low thrombus burden).  Patient has been on anticoagulation x 4 months.  Suspect anticoagulation can be discontinued if ok'd by Dr. Lucky Cowboy (message left).   2.  Pulmonary:  COPD flare with associated RLL pneumonia, improved.  Patient on doxycycline, oral prednisone taper, and Proventil. 3.  Gastroenterology:  Appreciate surgical input.  Small bowel obstruction, resolved.  Stool output.  KUB normal.  Advancing diet.  Surgery considering elective ostomy reversal or correction of parastomal hernia in future. 4.  Disposition:  Okay from an oncology standpoint to discharge today.  Thank you for allowing me to participate in SHANTRICE RODENBERG 's care.  I will follow her closely with you while hospitalized and after discharge in the outpatient department.  Lloyd Huger, MD  03/02/2015

## 2015-03-04 NOTE — Progress Notes (Signed)
CC: Abdominal pain Subjective: Patient reports that her abdominal pain is much improved and that she's had good ostomy output over the last 24 hours. Tolerating regular diet and states she wishes to go home.  Objective: Vital signs in last 24 hours: Temp:  [98.9 F (37.2 C)] 98.9 F (37.2 C) (02/25 1238) Pulse Rate:  [88] 88 (02/25 1238) Resp:  [22] 22 (02/25 1238) BP: (121)/(62) 121/62 mmHg (02/25 1238) SpO2:  [96 %-98 %] 96 % (02/25 1240) Last BM Date: 03/03/15  Intake/Output from previous day: 02/25 0701 - 02/26 0700 In: 1000 [P.O.:1000] Out: 1180 [Urine:1150; Stool:30] Intake/Output this shift: Total I/O In: 2000 [P.O.:2000] Out: 2630 [Urine:2600; Stool:30]  Physical exam:  Gen.: No acute distress Abdomen: Soft, nontender, nondistended. There is an easily reducible peristomal hernia and a functioning ostomy present on the abdomen.  Lab Results: CBC   Recent Labs  03/02/15 0432  WBC 6.4  HGB 10.4*  HCT 31.6*  PLT 174   BMET No results for input(s): NA, K, CL, CO2, GLUCOSE, BUN, CREATININE, CALCIUM in the last 72 hours. PT/INR  Recent Labs  03/02/15 0432 03/03/15 0532  LABPROT 20.5* 24.2*  INR 1.76 2.19   ABG No results for input(s): PHART, HCO3 in the last 72 hours.  Invalid input(s): PCO2, PO2  Studies/Results: US Venous Img Upper Uni Left  03/02/2015  CLINICAL DATA:  Left upper extremity DVT, subsequent counter EXAM: LEFT UPPER EXTREMITY VENOUS DOPPLER ULTRASOUND TECHNIQUE: Gray-scale sonography with graded compression, as well as color Doppler and duplex ultrasound were performed to evaluate the upper extremity deep venous system from the level of the subclavian vein and including the jugular, axillary, basilic, radial, ulnar and upper cephalic vein. Spectral Doppler was utilized to evaluate flow at rest and with distal augmentation maneuvers. COMPARISON:  10/14/2014 FINDINGS: Contralateral Subclavian Vein: Respiratory phasicity is normal and symmetric  with the symptomatic side. No evidence of thrombus. Normal compressibility. Internal Jugular Vein: Previous thrombus has resolved. Normal compressibility, respiratory phasicity and response to augmentation. Subclavian Vein: Previous thrombus has resolved. Normal compressibility, respiratory phasicity and response to augmentation. Axillary Vein: Previous thrombus has resolved. Normal compressibility, respiratory phasicity and response to augmentation. Cephalic Vein: Distal superficial cephalic vein at the level of the antecubital fossa demonstrates a short segment of residual occlusive thrombus with noncompressibility. Very minor residual thrombus burden. Basilic Vein: No evidence of thrombus. Normal compressibility, respiratory phasicity and response to augmentation. Brachial Veins: Brachial veins are paired. Proximal upper arm brachial vein demonstrates a short segment of residual intraluminal thrombus appearing noncompressible and nearly occlusive. Mid and distal brachial veins are now patent. Radial Veins: No evidence of thrombus. Normal compressibility, respiratory phasicity and response to augmentation. Ulnar Veins: No evidence of thrombus. Normal compressibility, respiratory phasicity and response to augmentation. Venous Reflux:  None visualized. Other Findings:  None visualized. IMPRESSION: Resolved thrombus in the right internal jugular, subclavian and axillary veins. Small amount of residual thrombus in the proximal right brachial vein over a short segment. Very low thrombus burden. Residual superficial thrombosis of the distal cephalic vein across the antecubital fossa, also very low residual thrombus burden. Electronically Signed   By: Jerilynn Mages.  Shick M.D.   On: 03/02/2015 13:07   Dg Chest Port 1 View  03/03/2015  CLINICAL DATA:  60 year old female with aspiration pneumonia. EXAM: PORTABLE CHEST 1 VIEW COMPARISON:  02/26/2015 and prior chest radiographs FINDINGS: This is a low volume film. Subsegmental  atelectasis in the right perihilar region and left lung base noted. There is  no evidence of edema, pneumothorax or definite pleural effusion. No acute bony abnormalities are present. IMPRESSION: Low volume film with new subsegmental atelectasis in the right perihilar and left basilar regions. Electronically Signed   By: Margarette Canada M.D.   On: 03/03/2015 08:14    Anti-infectives: Anti-infectives    Start     Dose/Rate Route Frequency Ordered Stop   03/02/15 0000  doxycycline (VIBRA-TABS) 100 MG tablet     100 mg Oral Every 12 hours 03/02/15 1508     02/28/15 1345  doxycycline (VIBRA-TABS) tablet 100 mg  Status:  Discontinued     100 mg Oral Every 12 hours 02/28/15 1330 03/03/15 1854   02/27/15 1900  Ampicillin-Sulbactam (UNASYN) 3 g in sodium chloride 0.9 % 100 mL IVPB  Status:  Discontinued     3 g 100 mL/hr over 60 Minutes Intravenous Every 8 hours 02/27/15 1813 02/28/15 1330   02/27/15 0000  levofloxacin (LEVAQUIN) 750 MG tablet  Status:  Discontinued     750 mg Oral Daily 02/27/15 0936 03/02/15       Assessment/Plan:  60 year old female with a peristomal hernia. Her possible bowel obstruction appears to completely resolved at this time. Okay for discharge home. She'll follow up with Dr. Burt Knack or assessment of either peristomal hernia repair versus ostomy takedown and reversal.  Addylin Manke T. Adonis Huguenin, MD, FACS  03/04/2015

## 2015-03-04 NOTE — Discharge Summary (Signed)
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 Schizophrenia ([place])   . Asthma   . GERD (gastroesophageal reflux disease)   . Anxiety   . Depression   . Bipolar disorder ([place])   . COPD (chronic obstructive pulmonary disease) ([place])   . Occasional tremors     right hand  . PTSD (post-traumatic stress disorder)   . Shortness of breath dyspnea   . Fibromyalgia   . DVT (deep venous thrombosis) ([place]) 2011    RUE  . Thyroid nodule   . DDD (degenerative disc disease), lumbar   . Spinal stenosis   . Peripheral neuropathy ([place])   . Rotator cuff tear     right  . Pneumonia 2011  . Hypothyroidism     no meds currently  . Anemia     during pregnancy only  . Hypertension     Off meds x 15 years-well controlled now per pt  . Squamous cell cancer, anus (HCC)   . Severe obstructive sleep apnea 06/27/2014  . DVT of upper extremity (deep vein thrombosis) ([place]) 10/14/2014    HOSPITAL COURSE:   60 year old female with past medical history of COPD, asthma, PTSD, rectal cancer status post colostomy, history of previous DVT, fibromyalgia, chronic anemia, hypertension, obstructive sleep apnea who presented to the hospital due to shortness of breath and wheezing and noted to be in COPD  exacerbation.  #1 COPD exacerbation-this was secondary to aspiration pneumonitis. -Patient was treated aggressively with IV steroids, oral doxycycline. She was also given duo nebs around-the-clock and also Pulmicort nebs. Patient is clinically improved and has less wheezing and bronchospasm. She is not being discharged on oral prednisone taper, doxycycline and also maintenance of her inhalers including Symbicort and Spiriva.  #2 aspiration pneumonitis-patient was started on oral doxycycline and will finished a course.  #3 small bowel obstruction-patient has some abdominal pain and was noted to be constipated. She does have a history of rectal cancer status post colostomy. A surgical consult was obtained and patient was given a suppository and also started on some laxatives. Her abdominal pain after getting laxatives and suppository is improved and she has significant stool in her colostomy. -She will continue follow-up with surgery as an outpatient.  #4 history of previous DVT-patient will resume her warfarin.  #5 chronic pain/fibromyalgia-patient will continue her Lyrica and oxycodone.  #6 depression-patient will resume her Lexapro and Cymbalta.  DISCHARGE CONDITIONS:   Stable  CONSULTS OBTAINED:  Treatment Team:  Florene Glen, MD Lequita Asal, MD Henreitta Leber, MD  DRUG ALLERGIES:   Allergies  Allergen Reactions  . Sulfa Antibiotics Hives    DISCHARGE MEDICATIONS:   Discharge Medication List as of 03/03/2015  3:32 PM  START taking these medications   Details  albuterol (PROVENTIL) (2.5 MG/3ML) 0.083% nebulizer solution Take 3 mLs (2.5 mg total) by nebulization every 4 (four) hours., Starting 02/27/2015, Until Discontinued, Normal    bisacodyl (DULCOLAX) 5 MG EC tablet Take 1 tablet (5 mg total) by mouth daily as needed for moderate constipation., Starting 02/27/2015, Until Discontinued, Normal    doxycycline (VIBRA-TABS) 100 MG tablet Take 1 tablet (100 mg  total) by mouth every 12 (twelve) hours., Starting 03/02/2015, Until Discontinued, Normal    ipratropium-albuterol (DUONEB) 0.5-2.5 (3) MG/3ML SOLN Take 3 mLs by nebulization every 4 (four) hours as needed., Starting 03/02/2015, Until Discontinued, Normal    nicotine (NICODERM CQ - DOSED IN MG/24 HOURS) 21 mg/24hr patch Place 1 patch (21 mg total) onto the skin daily., Starting 03/02/2015, Until Discontinued, Normal    nicotine polacrilex (NICORETTE) 2 MG gum Take 1 each (2 mg total) by mouth as needed for smoking cessation., Starting 03/02/2015, Until Discontinued, Normal    ondansetron (ZOFRAN) 4 MG tablet Take 1 tablet (4 mg total) by mouth every 8 (eight) hours as needed for nausea or vomiting., Starting 03/02/2015, Until Discontinued, Normal    oxyCODONE (ROXICODONE) 15 MG immediate release tablet Take 1 tablet (15 mg total) by mouth every 6 (six) hours as needed for severe pain., Starting 02/27/2015, Until Discontinued, Print    polyethylene glycol (MIRALAX / GLYCOLAX) packet Take 17 g by mouth daily., Starting 03/02/2015, Until Discontinued, Normal    predniSONE (STERAPRED UNI-PAK 21 TAB) 10 MG (21) TBPK tablet Take 1 tablet (10 mg total) by mouth daily. Please take 6 pills in the morning on the day 1 and 2, then taper by one pill every 2 days until finished. Thank you, Starting 02/27/2015, Until Discontinued, Normal    senna-docusate (SENOKOT-S) 8.6-50 MG tablet Take 1 tablet by mouth at bedtime as needed for mild constipation., Starting 02/27/2015, Until Discontinued, Normal      CONTINUE these medications which have NOT CHANGED   Details  albuterol (PROVENTIL HFA;VENTOLIN HFA) 108 (90 BASE) MCG/ACT inhaler Inhale 2 puffs into the lungs every 6 (six) hours as needed for wheezing or shortness of breath. , Until Discontinued, Historical Med    ARIPiprazole (ABILIFY) 2 MG tablet Take 1 tablet (2 mg total) by mouth daily., Starting 01/31/2015, Until Discontinued, Normal    budesonide-formoterol  (SYMBICORT) 160-4.5 MCG/ACT inhaler Inhale 2 puffs into the lungs 2 (two) times daily., Starting 01/30/2015, Until Discontinued, Normal    Calcium Carb-Cholecalciferol (CALCIUM 600+D) 600-800 MG-UNIT TABS Take 1 tablet by mouth 2 (two) times daily. , Until Discontinued, Historical Med    clonazePAM (KLONOPIN) 1 MG tablet Take 1 tablet (1 mg total) by mouth 3 (three) times daily as needed for anxiety., Starting 01/31/2015, Until Discontinued, Print    docusate sodium (COLACE) 100 MG capsule Take 100 mg by mouth daily., Until Discontinued, Historical Med    DULoxetine (CYMBALTA) 60 MG capsule Take 60 mg by mouth at bedtime., Until Discontinued, Historical Med    escitalopram (LEXAPRO) 20 MG tablet Take 1 tablet (20 mg total) by mouth daily., Starting 01/31/2015, Until Discontinued, Normal    pantoprazole (PROTONIX) 40 MG tablet Take 40 mg by mouth 2 (two) times daily. , Until Discontinued, Historical Med    pregabalin (LYRICA) 200 MG capsule Take 1 capsule (200 mg total) by mouth 2 (two) times daily., Starting 01/23/2015, Until Discontinued, Print    tiotropium (SPIRIVA) 18 MCG inhalation capsule Place 1 capsule (18 mcg total) into inhaler and inhale  daily., Starting 02/26/2015, Until Discontinued, Normal    !! warfarin (COUMADIN) 3 MG tablet Take 3 mg by mouth every evening. Pt takes with a '5mg'$  tablet., Until Discontinued, Historical Med    !! warfarin (COUMADIN) 5 MG tablet Take 5 mg by mouth every evening. Pt takes with a '3mg'$  tablet., Until Discontinued, Historical Med    zolpidem (AMBIEN) 10 MG tablet Take 1 tablet (10 mg total) by mouth at bedtime as needed for sleep., Starting 01/31/2015, Until Discontinued, Print    baclofen (LIORESAL) 10 MG tablet Take 1 tablet (10 mg total) by mouth 3 (three) times daily., Starting 02/09/2015, Until Discontinued, Normal     !! - Potential duplicate medications found. Please discuss with provider.    STOP taking these medications      oxyCODONE-acetaminophen (PERCOCET) 10-325 MG tablet      oxybutynin (DITROPAN) 5 MG tablet          DISCHARGE INSTRUCTIONS:   DIET:  Cardiac diet  DISCHARGE CONDITION:  Stable  ACTIVITY:  Activity as tolerated  OXYGEN:  Home Oxygen: No.   Oxygen Delivery: room air  DISCHARGE LOCATION:  home   If you experience worsening of your admission symptoms, develop shortness of breath, life threatening emergency, suicidal or homicidal thoughts you must seek medical attention immediately by calling 911 or calling your MD immediately  if symptoms less severe.  You Must read complete instructions/literature along with all the possible adverse reactions/side effects for all the Medicines you take and that have been prescribed to you. Take any new Medicines after you have completely understood and accpet all the possible adverse reactions/side effects.   Please note  You were cared for by a hospitalist during your hospital stay. If you have any questions about your discharge medications or the care you received while you were in the hospital after you are discharged, you can call the unit and asked to speak with the hospitalist on call if the hospitalist that took care of you is not available. Once you are discharged, your primary care physician will handle any further medical issues. Please note that NO REFILLS for any discharge medications will be authorized once you are discharged, as it is imperative that you return to your primary care physician (or establish a relationship with a primary care physician if you do not have one) for your aftercare needs so that they can reassess your need for medications and monitor your lab values.     Today   Constipation is resolved. No abdominal pain, nausea, vomiting. No shortness of breath. Ambulated on room air and did not desaturate below 90% and does not qualify for oxygen.  VITAL SIGNS:  Blood pressure 121/62, pulse 88, temperature 98.9 F  (37.2 C), temperature source Oral, resp. rate 22, height '5\' 6"'$  (1.676 m), weight 87.091 kg (192 lb), SpO2 96 %.  I/O:  No intake or output data in the 24 hours ending 03/04/15 1601  PHYSICAL EXAMINATION:  GENERAL:  60 y.o.-year-old patient lying in the bed with no acute distress.  EYES: Pupils equal, round, reactive to light and accommodation. No scleral icterus. Extraocular muscles intact.  HEENT: Head atraumatic, normocephalic. Oropharynx and nasopharynx clear.  NECK:  Supple, no jugular venous distention. No thyroid enlargement, no tenderness.  LUNGS: Normal breath sounds bilaterally, no wheezing, rales,rhonchi. No use of accessory muscles of respiration.  CARDIOVASCULAR: S1, S2 normal. No murmurs, rubs, or gallops.  ABDOMEN: Soft, non-tender, non-distended. Bowel sounds present. No organomegaly or mass. Positive colostomy in  place with stool. EXTREMITIES: No pedal edema, cyanosis, or clubbing.  NEUROLOGIC: Cranial nerves II through XII are intact. No focal motor or sensory defecits b/l.  PSYCHIATRIC: The patient is alert and oriented x 3. Good affect.  SKIN: No obvious rash, lesion, or ulcer.   DATA REVIEW:   CBC  Recent Labs Lab 03/02/15 0432  WBC 6.4  HGB 10.4*  HCT 31.6*  PLT 174    Chemistries   Recent Labs Lab 02/27/15 0725  NA 137  K 4.0  CL 106  CO2 24  GLUCOSE 135*  BUN 13  CREATININE 0.84  CALCIUM 8.5*    Cardiac Enzymes No results for input(s): TROPONINI in the last 168 hours.  Microbiology Results  No results found for this or any previous visit.  RADIOLOGY:  Dg Chest Port 1 View  03/03/2015  CLINICAL DATA:  60 year old female with aspiration pneumonia. EXAM: PORTABLE CHEST 1 VIEW COMPARISON:  02/26/2015 and prior chest radiographs FINDINGS: This is a low volume film. Subsegmental atelectasis in the right perihilar region and left lung base noted. There is no evidence of edema, pneumothorax or definite pleural effusion. No acute bony abnormalities  are present. IMPRESSION: Low volume film with new subsegmental atelectasis in the right perihilar and left basilar regions. Electronically Signed   By: Margarette Canada M.D.   On: 03/03/2015 08:14      Management plans discussed with the patient, family and they are in agreement.  CODE STATUS:  Code Status History    Date Active Date Inactive Code Status Order ID Comments User Context   02/26/2015  5:43 PM 03/03/2015  6:54 PM Full Code [id number]  Bettey Costa, MD Inpatient   10/14/2014 10:59 PM 10/17/2014  4:34 PM Full Code [id number]  Lytle Butte, MD ED   05/17/2014  8:38 PM 05/30/2014  4:55 PM Full Code [id number]  Fritzi Mandes, MD Inpatient    Advance Directive Documentation        Most Recent Value   Type of Advance Directive  Healthcare Power of Attorney   Pre-existing out of facility DNR order (yellow form or pink MOST form)     "MOST" Form in Place?        TOTAL TIME TAKING CARE OF THIS PATIENT: 40 minutes.    Henreitta Leber M.D on 03/04/2015 at 4:01 PM  Between 7am to 6pm - Pager - [phone]  After 6pm go to [email or web address] - password [email or web address]  Office  [phone]  CC: Primary care physician; [name], NP

## 2015-03-05 ENCOUNTER — Emergency Department: Payer: Commercial Managed Care - HMO

## 2015-03-05 ENCOUNTER — Inpatient Hospital Stay
Admission: EM | Admit: 2015-03-05 | Discharge: 2015-03-07 | DRG: 194 | Disposition: A | Discharge status: Home / Self Care | Payer: Commercial Managed Care - HMO | Attending: Internal Medicine | Admitting: Internal Medicine

## 2015-03-05 DIAGNOSIS — F209 Schizophrenia, unspecified: Secondary | ICD-10-CM | POA: Diagnosis present

## 2015-03-05 DIAGNOSIS — G4733 Obstructive sleep apnea (adult) (pediatric): Secondary | ICD-10-CM | POA: Diagnosis present

## 2015-03-05 DIAGNOSIS — I82509 Chronic embolism and thrombosis of unspecified deep veins of unspecified lower extremity: Secondary | ICD-10-CM | POA: Diagnosis present

## 2015-03-05 DIAGNOSIS — R0600 Dyspnea, unspecified: Secondary | ICD-10-CM | POA: Diagnosis present

## 2015-03-05 DIAGNOSIS — J45909 Unspecified asthma, uncomplicated: Secondary | ICD-10-CM | POA: Diagnosis present

## 2015-03-05 DIAGNOSIS — F319 Bipolar disorder, unspecified: Secondary | ICD-10-CM | POA: Diagnosis present

## 2015-03-05 DIAGNOSIS — Z7901 Long term (current) use of anticoagulants: Secondary | ICD-10-CM | POA: Diagnosis not present

## 2015-03-05 DIAGNOSIS — F1290 Cannabis use, unspecified, uncomplicated: Secondary | ICD-10-CM | POA: Diagnosis present

## 2015-03-05 DIAGNOSIS — J440 Chronic obstructive pulmonary disease with acute lower respiratory infection: Secondary | ICD-10-CM | POA: Diagnosis present

## 2015-03-05 DIAGNOSIS — F17210 Nicotine dependence, cigarettes, uncomplicated: Secondary | ICD-10-CM | POA: Diagnosis present

## 2015-03-05 DIAGNOSIS — G629 Polyneuropathy, unspecified: Secondary | ICD-10-CM | POA: Diagnosis present

## 2015-03-05 DIAGNOSIS — I10 Essential (primary) hypertension: Secondary | ICD-10-CM | POA: Diagnosis present

## 2015-03-05 DIAGNOSIS — F4310 Post-traumatic stress disorder, unspecified: Secondary | ICD-10-CM | POA: Diagnosis present

## 2015-03-05 DIAGNOSIS — R0902 Hypoxemia: Secondary | ICD-10-CM | POA: Diagnosis present

## 2015-03-05 DIAGNOSIS — J189 Pneumonia, unspecified organism: Principal | ICD-10-CM | POA: Diagnosis present

## 2015-03-05 LAB — BLOOD GAS, VENOUS
Acid-Base Excess: 3.7 mmol/L — ABNORMAL HIGH (ref 0.0–3.0)
BICARBONATE: 30.5 meq/L — AB (ref 21.0–28.0)
O2 Saturation: 76.4 %
Patient temperature: 37
pCO2, Ven: 54 mmHg (ref 44.0–60.0)
pH, Ven: 7.36 (ref 7.320–7.430)

## 2015-03-05 LAB — COMPREHENSIVE METABOLIC PANEL
ALT: 27 U/L (ref 14–54)
ANION GAP: 5 (ref 5–15)
AST: 34 U/L (ref 15–41)
Albumin: 3 g/dL — ABNORMAL LOW (ref 3.5–5.0)
Alkaline Phosphatase: 83 U/L (ref 38–126)
BILIRUBIN TOTAL: 0.8 mg/dL (ref 0.3–1.2)
BUN: 19 mg/dL (ref 6–20)
CO2: 29 mmol/L (ref 22–32)
Calcium: 8.3 mg/dL — ABNORMAL LOW (ref 8.9–10.3)
Chloride: 105 mmol/L (ref 101–111)
Creatinine, Ser: 1.03 mg/dL — ABNORMAL HIGH (ref 0.44–1.00)
GFR calc Af Amer: >60 mL/min (ref >60)
GFR, EST NON AFRICAN AMERICAN: 59 mL/min — AB (ref >60)
Glucose, Bld: 107 mg/dL — ABNORMAL HIGH (ref 65–99)
POTASSIUM: 3.9 mmol/L (ref 3.5–5.1)
Sodium: 139 mmol/L (ref 135–145)
TOTAL PROTEIN: 6.1 g/dL — AB (ref 6.5–8.1)

## 2015-03-05 LAB — CBC WITH DIFFERENTIAL/PLATELET
Basophils Absolute: 0 10*3/uL (ref 0–0.1)
Basophils Relative: 1 %
Eosinophils Absolute: 0.3 10*3/uL (ref 0–0.7)
Eosinophils Relative: 4 %
HEMATOCRIT: 34.2 % — AB (ref 35.0–47.0)
Hemoglobin: 11.6 g/dL — ABNORMAL LOW (ref 12.0–16.0)
LYMPHS ABS: 0.7 10*3/uL — AB (ref 1.0–3.6)
LYMPHS PCT: 7 %
MCH: 31.5 pg (ref 26.0–34.0)
MCHC: 33.9 g/dL (ref 32.0–36.0)
MCV: 92.9 fL (ref 80.0–100.0)
MONO ABS: 0.6 10*3/uL (ref 0.2–0.9)
MONOS PCT: 6 %
NEUTROS ABS: 7.7 10*3/uL — AB (ref 1.4–6.5)
Neutrophils Relative %: 82 %
Platelets: 202 10*3/uL (ref 150–440)
RBC: 3.68 MIL/uL — ABNORMAL LOW (ref 3.80–5.20)
RDW: 16.2 % — AB (ref 11.5–14.5)
WBC: 9.3 10*3/uL (ref 3.6–11.0)

## 2015-03-05 LAB — TROPONIN I

## 2015-03-05 LAB — TSH: TSH: 5.229 u[IU]/mL — ABNORMAL HIGH (ref 0.350–4.500)

## 2015-03-05 LAB — HEMOGLOBIN A1C: Hgb A1c MFr Bld: 4.4 % (ref 4.0–6.0)

## 2015-03-05 MED ORDER — SENNOSIDES-DOCUSATE SODIUM 8.6-50 MG PO TABS: 1.0000 | ORAL_TABLET | Freq: Every evening | ORAL | Status: DC | PRN

## 2015-03-05 MED ORDER — ACETAMINOPHEN 325 MG PO TABS
650.0000 mg | ORAL_TABLET | Freq: Four times a day (QID) | ORAL | Status: DC | PRN
Start: 2015-03-05 — End: 2015-03-07

## 2015-03-05 MED ORDER — PANTOPRAZOLE SODIUM 40 MG PO TBEC
40.0000 mg | DELAYED_RELEASE_TABLET | Freq: Two times a day (BID) | ORAL | Status: DC
  Administered 2015-03-05 – 2015-03-07 (×5): 40 mg via ORAL
  Filled 2015-03-05 (×5): qty 1

## 2015-03-05 MED ORDER — ALBUTEROL SULFATE (2.5 MG/3ML) 0.083% IN NEBU
2.5000 mg | INHALATION_SOLUTION | RESPIRATORY_TRACT | Status: DC
  Administered 2015-03-05 – 2015-03-07 (×15): 2.5 mg via RESPIRATORY_TRACT
  Filled 2015-03-05 (×15): qty 3

## 2015-03-05 MED ORDER — BACLOFEN 10 MG PO TABS
10.0000 mg | ORAL_TABLET | Freq: Three times a day (TID) | ORAL | Status: DC
  Administered 2015-03-05 – 2015-03-07 (×7): 10 mg via ORAL
  Filled 2015-03-05 (×7): qty 1

## 2015-03-05 MED ORDER — ALBUTEROL SULFATE (2.5 MG/3ML) 0.083% IN NEBU
5.0000 mg | INHALATION_SOLUTION | Freq: Once | RESPIRATORY_TRACT | Status: AC
  Administered 2015-03-05: 5 mg via RESPIRATORY_TRACT
  Filled 2015-03-05: qty 6

## 2015-03-05 MED ORDER — ARIPIPRAZOLE 2 MG PO TABS
2.0000 mg | ORAL_TABLET | Freq: Every day | ORAL | Status: DC
  Administered 2015-03-05 – 2015-03-07 (×3): 2 mg via ORAL
  Filled 2015-03-05 (×3): qty 1

## 2015-03-05 MED ORDER — MORPHINE SULFATE (PF) 4 MG/ML IV SOLN
4.0000 mg | Freq: Once | INTRAVENOUS | Status: AC
  Administered 2015-03-05: 4 mg via INTRAVENOUS
  Filled 2015-03-05: qty 1

## 2015-03-05 MED ORDER — OXYCODONE-ACETAMINOPHEN 7.5-325 MG PO TABS
1.0000 | ORAL_TABLET | Freq: Four times a day (QID) | ORAL | Status: DC | PRN
  Administered 2015-03-05 – 2015-03-07 (×5): 1 via ORAL
  Filled 2015-03-05 (×5): qty 1

## 2015-03-05 MED ORDER — SODIUM CHLORIDE 0.9% FLUSH
3.0000 mL | Freq: Two times a day (BID) | INTRAVENOUS | Status: DC
  Administered 2015-03-05 – 2015-03-06 (×4): 3 mL via INTRAVENOUS

## 2015-03-05 MED ORDER — CALCIUM CARBONATE-VITAMIN D 500-200 MG-UNIT PO TABS
1.0000 | ORAL_TABLET | Freq: Two times a day (BID) | ORAL | Status: DC
  Administered 2015-03-05 – 2015-03-07 (×5): 1 via ORAL
  Filled 2015-03-05 (×5): qty 1

## 2015-03-05 MED ORDER — ESCITALOPRAM OXALATE 10 MG PO TABS
20.0000 mg | ORAL_TABLET | Freq: Every day | ORAL | Status: DC
  Administered 2015-03-05 – 2015-03-07 (×3): 20 mg via ORAL
  Filled 2015-03-05 (×3): qty 2

## 2015-03-05 MED ORDER — WARFARIN SODIUM 2 MG PO TABS: 3.0000 mg | ORAL_TABLET | Freq: Every evening | ORAL | Status: DC

## 2015-03-05 MED ORDER — DOCUSATE SODIUM 100 MG PO CAPS
100.0000 mg | ORAL_CAPSULE | Freq: Every day | ORAL | Status: DC
  Administered 2015-03-05 – 2015-03-07 (×3): 100 mg via ORAL
  Filled 2015-03-05 (×3): qty 1

## 2015-03-05 MED ORDER — PREDNISONE 20 MG PO TABS
50.0000 mg | ORAL_TABLET | Freq: Every day | ORAL | Status: DC
  Administered 2015-03-05 – 2015-03-07 (×3): 50 mg via ORAL
  Filled 2015-03-05 (×3): qty 2

## 2015-03-05 MED ORDER — ACETAMINOPHEN 650 MG RE SUPP: 650.0000 mg | Freq: Four times a day (QID) | RECTAL | Status: DC | PRN

## 2015-03-05 MED ORDER — ZOLPIDEM TARTRATE 5 MG PO TABS: 10.0000 mg | ORAL_TABLET | Freq: Every evening | ORAL | Status: DC | PRN

## 2015-03-05 MED ORDER — ALBUTEROL SULFATE (2.5 MG/3ML) 0.083% IN NEBU: 2.5000 mg | INHALATION_SOLUTION | RESPIRATORY_TRACT | Status: DC

## 2015-03-05 MED ORDER — ONDANSETRON HCL 4 MG PO TABS: 4.0000 mg | ORAL_TABLET | Freq: Four times a day (QID) | ORAL | Status: DC | PRN

## 2015-03-05 MED ORDER — IOHEXOL 350 MG/ML SOLN
75.0000 mL | Freq: Once | INTRAVENOUS | Status: AC | PRN
  Administered 2015-03-05: 75 mL via INTRAVENOUS

## 2015-03-05 MED ORDER — WARFARIN - PHARMACIST DOSING INPATIENT
Freq: Every day | Status: DC
  Administered 2015-03-05: 18:00:00

## 2015-03-05 MED ORDER — ONDANSETRON HCL 4 MG/2ML IJ SOLN
4.0000 mg | Freq: Once | INTRAMUSCULAR | Status: AC
  Administered 2015-03-05: 4 mg via INTRAVENOUS
  Filled 2015-03-05: qty 2

## 2015-03-05 MED ORDER — WARFARIN SODIUM 4 MG PO TABS: 5.0000 mg | ORAL_TABLET | Freq: Every evening | ORAL | Status: DC

## 2015-03-05 MED ORDER — CLONAZEPAM 1 MG PO TABS: 1.0000 mg | ORAL_TABLET | Freq: Three times a day (TID) | ORAL | Status: DC | PRN

## 2015-03-05 MED ORDER — MORPHINE SULFATE (PF) 2 MG/ML IV SOLN: 2.0000 mg | INTRAVENOUS | Status: DC | PRN

## 2015-03-05 MED ORDER — SODIUM CHLORIDE 0.9 % IV SOLN
INTRAVENOUS | Status: DC
  Administered 2015-03-05 – 2015-03-07 (×5): via INTRAVENOUS

## 2015-03-05 MED ORDER — ONDANSETRON HCL 4 MG/2ML IJ SOLN: 4.0000 mg | Freq: Four times a day (QID) | INTRAMUSCULAR | Status: DC | PRN

## 2015-03-05 MED ORDER — PIPERACILLIN-TAZOBACTAM 3.375 G IVPB 30 MIN
3.3750 g | Freq: Once | INTRAVENOUS | Status: AC
  Administered 2015-03-05: 3.375 g via INTRAVENOUS
  Filled 2015-03-05: qty 50

## 2015-03-05 MED ORDER — LEVOFLOXACIN IN D5W 750 MG/150ML IV SOLN
750.0000 mg | Freq: Once | INTRAVENOUS | Status: AC
  Administered 2015-03-05: 750 mg via INTRAVENOUS
  Filled 2015-03-05: qty 150

## 2015-03-05 MED ORDER — PREGABALIN 75 MG PO CAPS
200.0000 mg | ORAL_CAPSULE | Freq: Two times a day (BID) | ORAL | Status: DC
  Administered 2015-03-05 – 2015-03-07 (×5): 200 mg via ORAL
  Filled 2015-03-05 (×5): qty 2

## 2015-03-05 MED ORDER — DULOXETINE HCL 60 MG PO CPEP
60.0000 mg | ORAL_CAPSULE | Freq: Every day | ORAL | Status: DC
  Administered 2015-03-05 – 2015-03-06 (×2): 60 mg via ORAL
  Filled 2015-03-05 (×3): qty 1

## 2015-03-05 MED ORDER — NICOTINE 21 MG/24HR TD PT24
21.0000 mg | MEDICATED_PATCH | Freq: Every day | TRANSDERMAL | Status: DC
  Administered 2015-03-05 – 2015-03-07 (×3): 21 mg via TRANSDERMAL
  Filled 2015-03-05 (×3): qty 1

## 2015-03-05 MED ORDER — TIOTROPIUM BROMIDE MONOHYDRATE 18 MCG IN CAPS
18.0000 ug | ORAL_CAPSULE | Freq: Every day | RESPIRATORY_TRACT | Status: DC
  Administered 2015-03-05 – 2015-03-07 (×3): 18 ug via RESPIRATORY_TRACT
  Filled 2015-03-05: qty 5

## 2015-03-05 MED ORDER — MOMETASONE FURO-FORMOTEROL FUM 200-5 MCG/ACT IN AERO
2.0000 | INHALATION_SPRAY | Freq: Two times a day (BID) | RESPIRATORY_TRACT | Status: DC
  Administered 2015-03-05 – 2015-03-07 (×5): 2 via RESPIRATORY_TRACT
  Filled 2015-03-05: qty 8.8

## 2015-03-05 MED ORDER — BISACODYL 5 MG PO TBEC: 5.0000 mg | DELAYED_RELEASE_TABLET | Freq: Every day | ORAL | Status: DC | PRN

## 2015-03-05 MED ORDER — CALCIUM CARB-CHOLECALCIFEROL 600-800 MG-UNIT PO TABS: 1.0000 | ORAL_TABLET | Freq: Two times a day (BID) | ORAL | Status: DC

## 2015-03-05 MED ORDER — WARFARIN SODIUM 4 MG PO TABS
8.0000 mg | ORAL_TABLET | Freq: Every day | ORAL | Status: DC
  Administered 2015-03-05 – 2015-03-06 (×2): 8 mg via ORAL
  Filled 2015-03-05 (×2): qty 2

## 2015-03-05 NOTE — ED Notes (Signed)
Family at bedside. 

## 2015-03-05 NOTE — H&P (Signed)
[name] is an 60 y.o. female.   Chief Complaint: Pain HPI: The patient states that she presents emergency department today due to "pain all over". He was discharged 2 days ago following a COPD exacerbation secondary to pneumonitis. She had been taking doxycycline and steroids but became more short of breath while at home. The patient admits to nonproductive cough. In the emergency department she was found to have an oxygen requirement of 2 L/m via nasal cannula. Chest x-ray revealed right bibasilar opacity concerning for pneumonia.  Past Medical History  Diagnosis Date  . Schizophrenia ([place])   . Asthma   . GERD (gastroesophageal reflux disease)   . Anxiety   . Depression   . Bipolar disorder ([place])   . COPD (chronic obstructive pulmonary disease) ([place])   . Occasional tremors     right hand  . PTSD (post-traumatic stress disorder)   . Shortness of breath dyspnea   . Fibromyalgia   . DVT (deep venous thrombosis) ([place]) 2011    RUE  . Thyroid nodule   . DDD (degenerative disc disease), lumbar   . Spinal stenosis   . Peripheral neuropathy ([place])   . Rotator cuff tear     right  . Pneumonia 2011  . Hypothyroidism     no meds currently  . Anemia     during pregnancy only  . Hypertension     Off meds x 15 years-well controlled now per pt  . Squamous cell cancer, anus ([facility])   . Severe obstructive sleep apnea 06/27/2014  . DVT of upper extremity (deep vein thrombosis) ([place]) 10/14/2014    Past Surgical History  Procedure Laterality Date  . Foot surgery Right   . Tubal ligation    . Eye surgery Bilateral   . Mouth surgery  2002  . Rectal biopsy N/A 05/08/2014    Procedure: BIOPSY RECTAL;  Surgeon: Marlyce Huge, MD;  Location: [facility] ORS;  Service: General;  Laterality: N/A;  . Evaluation under anesthesia with hemorrhoidectomy N/A 05/08/2014    Procedure: EXAM UNDER ANESTHESIA WITH HEMORRHOIDECTOMY;  Surgeon: Marlyce Huge, MD;  Location: [facility] OR[facility];  Service:  General;  Laterality: N/A;  . Portacath placement N/A 05/20/2014    Procedure: INSERTION PORT-A-CATH;  Surgeon: Florene Glen, MD;  Location: [facility] ORS;  Service: General;  Laterality: N/A;  . Laparoscopic diverted colostomy N/A 05/26/2014    Procedure: LAPAROSCOPIC DIVERTED COLOSTOMY;  Surgeon: Marlyce Huge, MD;  Location: [facility] OR[facility];  Service: General;  Laterality: N/A;  . Peripheral vascular catheterization Left 10/16/2014    Procedure: Upper Extremity Venography with thrombectomy, port removal;  Surgeon: Algernon Huxley, MD;  Location: [place] [place] [facility];  Service: Cardiovascular;  Laterality: Left;  . Peripheral vascular catheterization  10/16/2014    Procedure: Upper Extremity Intervention;  Surgeon: Algernon Huxley, MD;  Location: [place] [facility];  Service: Cardiovascular;;    Family History  Problem Relation Age of Onset  . Cancer Maternal Aunt   . Cancer Paternal Uncle   . Diabetes Mother   . Thyroid disease Mother   . Kidney failure Mother   . Hypertension Mother   . Depression Mother   . Mental illness Son   . Aneurysm Maternal Grandmother   . Thyroid disease Maternal Grandmother   . Stroke Maternal Grandfather   . Hypertension Maternal Grandfather   . Diabetes Maternal Grandfather   . Heart disease Maternal Grandfather     MI  . Nephrolithiasis Daughter    Social History:  reports that she has been smoking Cigarettes.  She started smoking about 44 years ago. She has a 11 pack-year smoking history. She has never used smokeless tobacco. She reports that she uses illicit drugs (Marijuana). She reports that she does not drink alcohol.  Allergies:  Allergies  Allergen Reactions  . Sulfa Antibiotics Hives    Medications Prior to Admission  Medication Sig Dispense Refill  . albuterol (PROVENTIL HFA;VENTOLIN HFA) 108 (90 BASE) MCG/ACT inhaler Inhale 2 puffs into the lungs every 6 (six) hours as needed for wheezing or shortness of breath.     Marland Kitchen albuterol (PROVENTIL)  (2.5 MG/3ML) 0.083% nebulizer solution Take 3 mLs (2.5 mg total) by nebulization every 4 (four) hours. 360 vial 12  . ARIPiprazole (ABILIFY) 2 MG tablet Take 1 tablet (2 mg total) by mouth daily. 30 tablet 4  . bisacodyl (DULCOLAX) 5 MG EC tablet Take 1 tablet (5 mg total) by mouth daily as needed for moderate constipation. 30 tablet 0  . budesonide-formoterol (SYMBICORT) 160-4.5 MCG/ACT inhaler Inhale 2 puffs into the lungs 2 (two) times daily. 1 Inhaler 12  . Calcium Carb-Cholecalciferol (CALCIUM 600+D) 600-800 MG-UNIT TABS Take 1 tablet by mouth 2 (two) times daily.     . clonazePAM (KLONOPIN) 1 MG tablet Take 1 tablet (1 mg total) by mouth 3 (three) times daily as needed for anxiety. 90 tablet 4  . docusate sodium (COLACE) 100 MG capsule Take 100 mg by mouth daily.    Marland Kitchen doxycycline (VIBRA-TABS) 100 MG tablet Take 1 tablet (100 mg total) by mouth every 12 (twelve) hours. 18 tablet 0  . DULoxetine (CYMBALTA) 60 MG capsule Take 60 mg by mouth at bedtime.    Marland Kitchen escitalopram (LEXAPRO) 20 MG tablet Take 1 tablet (20 mg total) by mouth daily. 30 tablet 4  . ipratropium-albuterol (DUONEB) 0.5-2.5 (3) MG/3ML SOLN Take 3 mLs by nebulization every 4 (four) hours as needed. 360 mL 6  . nicotine (NICODERM CQ - DOSED IN MG/24 HOURS) 21 mg/24hr patch Place 1 patch (21 mg total) onto the skin daily. 28 patch 0  . nicotine polacrilex (NICORETTE) 2 MG gum Take 1 each (2 mg total) by mouth as needed for smoking cessation. 100 tablet 0  . ondansetron (ZOFRAN) 4 MG tablet Take 1 tablet (4 mg total) by mouth every 8 (eight) hours as needed for nausea or vomiting. 20 tablet 0  . oxyCODONE (ROXICODONE) 15 MG immediate release tablet Take 1 tablet (15 mg total) by mouth every 6 (six) hours as needed for severe pain. 30 tablet 0  . pantoprazole (PROTONIX) 40 MG tablet Take 40 mg by mouth 2 (two) times daily.     . polyethylene glycol (MIRALAX / GLYCOLAX) packet Take 17 g by mouth daily. 14 each 0  . predniSONE (STERAPRED  UNI-PAK 21 TAB) 10 MG (21) TBPK tablet Take 1 tablet (10 mg total) by mouth daily. Please take 6 pills in the morning on the day 1 and 2, then taper by one pill every 2 days until finished. Thank you 42 tablet 0  . pregabalin (LYRICA) 200 MG capsule Take 1 capsule (200 mg total) by mouth 2 (two) times daily. 90 capsule 6  . senna-docusate (SENOKOT-S) 8.6-50 MG tablet Take 1 tablet by mouth at bedtime as needed for mild constipation. 30 tablet 5  . tiotropium (SPIRIVA) 18 MCG inhalation capsule Place 1 capsule (18 mcg total) into inhaler and inhale daily. 30 capsule 12  . warfarin (COUMADIN) 3 MG tablet Take 3 mg  by mouth every evening. Pt takes with a 6m tablet.    . warfarin (COUMADIN) 5 MG tablet Take 5 mg by mouth every evening. Pt takes with a 336mtablet.    . Marland Kitchenolpidem (AMBIEN) 10 MG tablet Take 1 tablet (10 mg total) by mouth at bedtime as needed for sleep. 30 tablet 4  . baclofen (LIORESAL) 10 MG tablet Take 1 tablet (10 mg total) by mouth 3 (three) times daily. (Patient not taking: Reported on 02/28/2015) 90 each 6    Results for orders placed or performed during the hospital encounter of 03/05/15 (from the past 48 hour(s))  CBC with Differential     Status: Abnormal   Collection Time: 03/05/15  1:00 AM  Result Value Ref Range   WBC 9.3 3.6 - 11.0 K/uL   RBC 3.68 (L) 3.80 - 5.20 MIL/uL   Hemoglobin 11.6 (L) 12.0 - 16.0 g/dL   HCT 34.2 (L) 35.0 - 47.0 %   MCV 92.9 80.0 - 100.0 fL   MCH 31.5 26.0 - 34.0 pg   MCHC 33.9 32.0 - 36.0 g/dL   RDW 16.2 (H) 11.5 - 14.5 %   Platelets 202 150 - 440 K/uL   Neutrophils Relative % 82 %   Neutro Abs 7.7 (H) 1.4 - 6.5 K/uL   Lymphocytes Relative 7 %   Lymphs Abs 0.7 (L) 1.0 - 3.6 K/uL   Monocytes Relative 6 %   Monocytes Absolute 0.6 0.2 - 0.9 K/uL   Eosinophils Relative 4 %   Eosinophils Absolute 0.3 0 - 0.7 K/uL   Basophils Relative 1 %   Basophils Absolute 0.0 0 - 0.1 K/uL  Comprehensive metabolic panel     Status: Abnormal   Collection  Time: 03/05/15  1:00 AM  Result Value Ref Range   Sodium 139 135 - 145 mmol/L   Potassium 3.9 3.5 - 5.1 mmol/L   Chloride 105 101 - 111 mmol/L   CO2 29 22 - 32 mmol/L   Glucose, Bld 107 (H) 65 - 99 mg/dL   BUN 19 6 - 20 mg/dL   Creatinine, Ser 1.03 (H) 0.44 - 1.00 mg/dL   Calcium 8.3 (L) 8.9 - 10.3 mg/dL   Total Protein 6.1 (L) 6.5 - 8.1 g/dL   Albumin 3.0 (L) 3.5 - 5.0 g/dL   AST 34 15 - 41 U/L   ALT 27 14 - 54 U/L   Alkaline Phosphatase 83 38 - 126 U/L   Total Bilirubin 0.8 0.3 - 1.2 mg/dL   GFR calc non Af Amer 59 (L) >60 mL/min   GFR calc Af Amer >60 >60 mL/min    Comment: (NOTE) The eGFR has been calculated using the CKD EPI equation. This calculation has not been validated in all clinical situations. eGFR's persistently <60 mL/min signify possible Chronic Kidney Disease.    Anion gap 5 5 - 15  Troponin I     Status: None   Collection Time: 03/05/15  1:00 AM  Result Value Ref Range   Troponin I <0.03 <0.031 ng/mL    Comment:        NO INDICATION OF MYOCARDIAL INJURY.   Blood gas, venous     Status: Abnormal (Preliminary result)   Collection Time: 03/05/15  2:12 AM  Result Value Ref Range   pH, Ven 7.36 7.320 - 7.430   pCO2, Ven 54 44.0 - 60.0 mmHg   pO2, Ven PENDING 30.0 - 45.0 mmHg   Bicarbonate 30.5 (H) 21.0 - 28.0 mEq/L  Acid-Base Excess 3.7 (H) 0.0 - 3.0 mmol/L   O2 Saturation 76.4 %   Patient temperature 37.0    Collection site LINE    Sample type VENOUS    Dg Chest 2 View  03/05/2015  CLINICAL DATA:  60 year old female with shortness of breath. Patient was recently discharged from the hospital with admission diagnosis of pneumonia. EXAM: CHEST  2 VIEW COMPARISON:  Radiograph dated 03/03/2015 and CT dated 10/22/2014 FINDINGS: Two views of the chest demonstrate bibasilar subsegmental densities, likely atelectasis/scarring. Pneumonia is not excluded. Clinical correlation is recommended. There is blunting of the right costophrenic angle which may be related to  underlying atelectasis/scarring or represent a small right pleural effusion. No pneumothorax. Stable cardiac silhouette. Multiple lower thoracic compression fractures as seen on the prior CT. IMPRESSION: Bibasilar subsegmental atelectasis/ scarring versus pneumonia. Clinical correlation and follow-up recommended. Electronically Signed   By: Anner Crete M.D.   On: 03/05/2015 01:56   Ct Angio Chest Pe W/cm &/or Wo Cm  03/05/2015  CLINICAL DATA:  Acute onset of shortness of breath and right-sided chest pain. Initial encounter. EXAM: CT ANGIOGRAPHY CHEST WITH CONTRAST TECHNIQUE: Multidetector CT imaging of the chest was performed using the standard protocol during bolus administration of intravenous contrast. Multiplanar CT image reconstructions and MIPs were obtained to evaluate the vascular anatomy. CONTRAST:  51m OMNIPAQUE IOHEXOL 350 MG/ML SOLN COMPARISON:  Chest radiograph performed earlier today at 1:24 a.m., and CTA of the chest performed 10/22/2014 FINDINGS: There is no evidence of pulmonary embolus. A small right pleural effusion is noted, somewhat loculated in appearance, with right basilar airspace opacification, concerning for pneumonia. Mild left basilar atelectasis is noted. There is no evidence of pneumothorax. No masses are identified; no abnormal focal contrast enhancement is seen. The mediastinum is unremarkable in appearance. No mediastinal lymphadenopathy is seen. No pericardial effusion is identified. The great vessels are grossly unremarkable. No axillary lymphadenopathy is seen. The visualized portions of the thyroid gland are unremarkable in appearance. A small amount of ascites is noted within the upper abdomen, surrounding the liver and spleen. The visualized portions of the gallbladder, pancreas and adrenal glands are unremarkable. No acute osseous abnormalities are seen. Chronic compression deformities are noted at T6, T9, T10 and T12. Review of the MIP images confirms the above  findings. IMPRESSION: 1. No evidence of pulmonary embolus. 2. Small right pleural effusion, with right basilar airspace opacification, concerning for pneumonia. 3. Mild left basilar atelectasis noted. 4. Small amount of ascites within the upper abdomen. 5. Chronic compression deformities at T6, T9, T10 and T12, grossly stable from the prior study. Electronically Signed   By: JGarald BaldingM.D.   On: 03/05/2015 03:46    Review of Systems  Constitutional: Negative for fever and chills.  HENT: Negative for sore throat and tinnitus.   Eyes: Negative for blurred vision and redness.  Respiratory: Positive for cough and shortness of breath.   Cardiovascular: Positive for chest pain (Right chest wall). Negative for palpitations, orthopnea and PND.  Gastrointestinal: Negative for nausea, vomiting, abdominal pain and diarrhea.  Genitourinary: Negative for dysuria, urgency and frequency.  Musculoskeletal: Negative for myalgias and joint pain.  Skin: Negative for rash.       No lesions  Neurological: Negative for speech change, focal weakness and weakness.  Endo/Heme/Allergies: Does not bruise/bleed easily.       No temperature intolerance  Psychiatric/Behavioral: Negative for depression and suicidal ideas.    Blood pressure 136/73, pulse 79, temperature 97.6 F (36.4 C),  temperature source Oral, resp. rate 17, height 5' 6"  (1.676 m), weight 93.078 kg (205 lb 3.2 oz), SpO2 92 %. Physical Exam  Vitals reviewed. Constitutional: She is oriented to person, place, and time. She appears well-developed and well-nourished. No distress.  HENT:  Head: Normocephalic and atraumatic.  Mouth/Throat: Oropharynx is clear and moist.  Eyes: Conjunctivae and EOM are normal. Pupils are equal, round, and reactive to light. No scleral icterus.  Neck: Normal range of motion. Neck supple. No JVD present. No tracheal deviation present. No thyromegaly present.  Cardiovascular: Normal rate, regular rhythm and normal heart  sounds.  Exam reveals no gallop and no friction rub.   No murmur heard. Respiratory: Effort normal. She has decreased breath sounds in the right lower field and the left lower field. She exhibits tenderness.  GI: Soft. Bowel sounds are normal. She exhibits no distension. There is no tenderness.  Genitourinary:  Deferred  Musculoskeletal: Normal range of motion. She exhibits no edema.  Lymphadenopathy:    She has no cervical adenopathy.  Neurological: She is alert and oriented to person, place, and time. No cranial nerve deficit. She exhibits normal muscle tone.  Skin: Skin is warm and dry.  Psychiatric: She has a normal mood and affect. Her behavior is normal. Judgment and thought content normal.     Assessment/Plan This is a 60 year old female with COPD admitted for healthcare associated pneumonia. 1. Pneumonia: Healthcare associated. The patient has received Zosyn and Levaquin in the emergency department. I have started the patient on non-tapering dose of steroid. Convert to oral antibiotics as the patient does not have signs or symptoms of sepsis 2. COPD: Continue inhalers per home regimen 3. Essential hypertension: Controlled 4. Mood disorder: Continue Abilify, Cymbalta and Lexapro 5. Chronic DVT: Continue warfarin 6. GI prophylaxis: Pantoprazole per home regimen The patient is a full code. Time spent on admission was inpatient care possibly 45 minutes  [name], MD 03/05/2015, 7:31 AM

## 2015-03-05 NOTE — Care Management Note (Signed)
Case Management Note  Patient Details  Name: [name] MRN: [id number] Date of Birth: [birth year]  Subjective/Objective:                  Met with patient to discuss discharge planning. She is not on home O2. She has a nebulizer at home. Her PCP is Dr. [name]. She has history of [facility] for home health.  She lives with her friend Selena Batten and her husband Hurley Cisco. She depends on [place] for transportation. She states she has not problem obtaining Rx or getting to doctor appointments.   Action/Plan: List of home health agencies left with patient. RNCM will follow for home health needs.   Expected Discharge Date:                  Expected Discharge Plan:     In-House Referral:     Discharge planning Services  CM Consult  Post Acute Care Choice:    Choice offered to:  Patient  DME Arranged:    DME Agency:     HH Arranged:    [place] Agency:     Status of Service:     Medicare Important Message Given:    Date Medicare IM Given:    Medicare IM give by:    Date Additional Medicare IM Given:    Additional Medicare Important Message give by:     If discussed at [place] of Stay Meetings, dates discussed:    Additional Comments:  Marshell Garfinkel, RN 03/05/2015, 2:04 PM

## 2015-03-05 NOTE — Plan of Care (Signed)
Problem: Education: Goal: Knowledge of [facility] Education information/materials will improve Outcome: Progressing Oriented to Unit and Room. Room handout at bedside. Reviewed plan of care involving medications, nebulizers, oxygen and pain management. Smoking Cessation Provided. States, " I have not smoked in a week." Encouragement provided.  Problem: Safety: Goal: Ability to remain free from injury will improve Outcome: Progressing Moderated Fall Risk. Standby Assist. Tolerated Well.  Problem: Pain Managment: Goal: General experience of comfort will improve Outcome: Progressing During A.M. , sleeping, but arousable with verbal stimuli. Awakened to take morning medication, but fell back to sleep. Resting comfortably.  Problem: Physical Regulation: Goal: Will remain free from infection Outcome: Progressing Received IV Antibiotics. WBC's within Normal limits.  Problem: Activity: Goal: Risk for activity intolerance will decrease Outcome: Progressing Out of Bed with Stand by assist. Tolerated Well.  Problem: Bowel/Gastric: Goal: Will not experience complications related to bowel motility Outcome: Progressing LUQ colostomy with soft brown stool noted. Patient provides her own colostomy care.

## 2015-03-05 NOTE — ED Provider Notes (Signed)
[facility] Emergency Department Provider Note  ____________________________________________  Time seen: Approximately 150 AM  I have reviewed the triage vital signs and the nursing notes.   HISTORY  Chief Complaint Shortness of Breath  The patient is a poor historian and does not fully answer questions, she is falling asleep during the exam  HPI [name] is a 60 y.o. female who comes into the hospital today with shortness of breath. The patient reports that she discovered the hospital yesterday and started feeling short of breath today around 3 PM. She reports that it was really bad and reports is due to her aspiration. She reports that she was breathing very rough. She also is having pain on her right side that she reports is worse when she takes a deep breath. The patient denies any trauma. She denies fevers but reports that she was here for 5 days. The patient has a history of COPD and has been using her inhalers which she reports helped but she kept feeling short of breath. The patient denies any anterior chest pain.The patient reports her pain as a 10 out of 10 in intensity   Past Medical History  Diagnosis Date  . Schizophrenia ([place])   . Asthma   . GERD (gastroesophageal reflux disease)   . Anxiety   . Depression   . Bipolar disorder ([place])   . COPD (chronic obstructive pulmonary disease) ([place])   . Occasional tremors     right hand  . PTSD (post-traumatic stress disorder)   . Shortness of breath dyspnea   . Fibromyalgia   . DVT (deep venous thrombosis) ([place]) 2011    RUE  . Thyroid nodule   . DDD (degenerative disc disease), lumbar   . Spinal stenosis   . Peripheral neuropathy ([place])   . Rotator cuff tear     right  . Pneumonia 2011  . Hypothyroidism     no meds currently  . Anemia     during pregnancy only  . Hypertension     Off meds x 15 years-well controlled now per pt  . Squamous cell cancer, anus (HCC)   . Severe obstructive  sleep apnea 06/27/2014  . DVT of upper extremity (deep vein thrombosis) ([place]) 10/14/2014    Patient Active Problem List   Diagnosis Date Noted  . 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 GI bleed 03/02/2015  . SBO (small bowel obstruction) ([place])   . Anemia   . COPD exacerbation ([place]) 02/27/2015  . Aspiration pneumonitis ([place]) 02/27/2015  . Small bowel obstruction ([place]) 02/27/2015  . Hyperglycemia 02/27/2015  . Chronic pain syndrome 02/27/2015  . COPD (chronic obstructive pulmonary disease) ([place]) 02/26/2015  . COPD, very severe ([place]) 12/12/2014  . Fibromyalgia 12/12/2014  . DVT of upper extremity (deep vein thrombosis) ([place]) 10/14/2014  . B12 deficiency 09/28/2014  . Folate deficiency 09/28/2014  . Macrocytosis 09/21/2014  . Depression   . Hypokalemia 07/20/2014  . Anal cancer ([place]) 06/27/2014  . Anal fissure 06/27/2014  . Anxiety 06/27/2014  . Airway hyperreactivity 06/27/2014  . H/O manic depressive disorder 06/27/2014  . CAFL (chronic airflow limitation) ([place]) 06/27/2014  . Clinical depression 06/27/2014  . Deep vein thrombosis ([place]) 06/27/2014  . External hemorrhoid 06/27/2014  . Myalgia and myositis 06/27/2014  . Acid reflux 06/27/2014  . Hemorrhoid 06/27/2014  . BP (high blood pressure) 06/27/2014  . HLD (hyperlipidemia) 06/27/2014  . Adult hypothyroidism 06/27/2014  . LBP (low back pain) 06/27/2014  .  Mass of perianal area 06/27/2014  . External hemorrhoid, thrombosed 06/27/2014  . 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 Ulcerated hemorrhoid 06/27/2014  . Severe obstructive sleep apnea 06/27/2014  . Squamous cell cancer, anus (HCC)   . Rectal ulcer 05/17/2014    Past Surgical History  Procedure Laterality Date  . Foot surgery Right   . Tubal ligation    . Eye surgery Bilateral   . Mouth surgery  2002  . Rectal biopsy N/A 05/08/2014    Procedure: BIOPSY RECTAL;  Surgeon: Marlyce Huge, MD;  Location: [facility] ORS;  Service:  General;  Laterality: N/A;  . Evaluation under anesthesia with hemorrhoidectomy N/A 05/08/2014    Procedure: EXAM UNDER ANESTHESIA WITH HEMORRHOIDECTOMY;  Surgeon: Marlyce Huge, MD;  Location: [facility] ORS;  Service: General;  Laterality: N/A;  . Portacath placement N/A 05/20/2014    Procedure: INSERTION PORT-A-CATH;  Surgeon: Florene Glen, MD;  Location: [facility] ORS;  Service: General;  Laterality: N/A;  . Laparoscopic diverted colostomy N/A 05/26/2014    Procedure: LAPAROSCOPIC DIVERTED COLOSTOMY;  Surgeon: Marlyce Huge, MD;  Location: [facility] ORS;  Service: General;  Laterality: N/A;  . Peripheral vascular catheterization Left 10/16/2014    Procedure: Upper Extremity Venography with thrombectomy, port removal;  Surgeon: Algernon Huxley, MD;  Location: [place] [facility];  Service: Cardiovascular;  Laterality: Left;  . Peripheral vascular catheterization  10/16/2014    Procedure: Upper Extremity Intervention;  Surgeon: Algernon Huxley, MD;  Location: [place] [facility];  Service: Cardiovascular;;    Current Outpatient Rx  Name  Route  Sig  Dispense  Refill  . albuterol (PROVENTIL HFA;VENTOLIN HFA) 108 (90 BASE) MCG/ACT inhaler   Inhalation   Inhale 2 puffs into the lungs every 6 (six) hours as needed for wheezing or shortness of breath.          Marland Kitchen albuterol (PROVENTIL) (2.5 MG/3ML) 0.083% nebulizer solution   Nebulization   Take 3 mLs (2.5 mg total) by nebulization every 4 (four) hours.   360 vial   12   . ARIPiprazole (ABILIFY) 2 MG tablet   Oral   Take 1 tablet (2 mg total) by mouth daily.   30 tablet   4   . bisacodyl (DULCOLAX) 5 MG EC tablet   Oral   Take 1 tablet (5 mg total) by mouth daily as needed for moderate constipation.   30 tablet   0   . budesonide-formoterol (SYMBICORT) 160-4.5 MCG/ACT inhaler   Inhalation   Inhale 2 puffs into the lungs 2 (two) times daily.   1 Inhaler   12   . Calcium Carb-Cholecalciferol (CALCIUM 600+D) 600-800 MG-UNIT TABS    Oral   Take 1 tablet by mouth 2 (two) times daily.          . clonazePAM (KLONOPIN) 1 MG tablet   Oral   Take 1 tablet (1 mg total) by mouth 3 (three) times daily as needed for anxiety.   90 tablet   4   . docusate sodium (COLACE) 100 MG capsule   Oral   Take 100 mg by mouth daily.         Marland Kitchen doxycycline (VIBRA-TABS) 100 MG tablet   Oral   Take 1 tablet (100 mg total) by mouth every 12 (twelve) hours.   18 tablet   0   . DULoxetine (CYMBALTA) 60 MG capsule   Oral   Take 60 mg by mouth at bedtime.         Marland Kitchen escitalopram (  LEXAPRO) 20 MG tablet   Oral   Take 1 tablet (20 mg total) by mouth daily.   30 tablet   4     HOLD UNTIL PATIENT CALLS FOR REFILL   . ipratropium-albuterol (DUONEB) 0.5-2.5 (3) MG/3ML SOLN   Nebulization   Take 3 mLs by nebulization every 4 (four) hours as needed.   360 mL   6   . nicotine (NICODERM CQ - DOSED IN MG/24 HOURS) 21 mg/24hr patch   Transdermal   Place 1 patch (21 mg total) onto the skin daily.   28 patch   0   . nicotine polacrilex (NICORETTE) 2 MG gum   Oral   Take 1 each (2 mg total) by mouth as needed for smoking cessation.   100 tablet   0   . ondansetron (ZOFRAN) 4 MG tablet   Oral   Take 1 tablet (4 mg total) by mouth every 8 (eight) hours as needed for nausea or vomiting.   20 tablet   0   . oxyCODONE (ROXICODONE) 15 MG immediate release tablet   Oral   Take 1 tablet (15 mg total) by mouth every 6 (six) hours as needed for severe pain.   30 tablet   0   . pantoprazole (PROTONIX) 40 MG tablet   Oral   Take 40 mg by mouth 2 (two) times daily.          . polyethylene glycol (MIRALAX / GLYCOLAX) packet   Oral   Take 17 g by mouth daily.   14 each   0   . predniSONE (STERAPRED UNI-PAK 21 TAB) 10 MG (21) TBPK tablet   Oral   Take 1 tablet (10 mg total) by mouth daily. Please take 6 pills in the morning on the day 1 and 2, then taper by one pill every 2 days until finished. Thank you   42 tablet   0   .  pregabalin (LYRICA) 200 MG capsule   Oral   Take 1 capsule (200 mg total) by mouth 2 (two) times daily.   90 capsule   6   . senna-docusate (SENOKOT-S) 8.6-50 MG tablet   Oral   Take 1 tablet by mouth at bedtime as needed for mild constipation.   30 tablet   5   . tiotropium (SPIRIVA) 18 MCG inhalation capsule   Inhalation   Place 1 capsule (18 mcg total) into inhaler and inhale daily.   30 capsule   12   . warfarin (COUMADIN) 3 MG tablet   Oral   Take 3 mg by mouth every evening. Pt takes with a '5mg'$  tablet.         . warfarin (COUMADIN) 5 MG tablet   Oral   Take 5 mg by mouth every evening. Pt takes with a '3mg'$  tablet.         Marland Kitchen zolpidem (AMBIEN) 10 MG tablet   Oral   Take 1 tablet (10 mg total) by mouth at bedtime as needed for sleep.   30 tablet   4   . baclofen (LIORESAL) 10 MG tablet   Oral   Take 1 tablet (10 mg total) by mouth 3 (three) times daily. Patient not taking: Reported on 02/28/2015   90 each   6     Allergies Sulfa antibiotics  Family History  Problem Relation Age of Onset  . Cancer Maternal Aunt   . Cancer Paternal Uncle   . Diabetes Mother   . Thyroid disease Mother   .  Kidney failure Mother   . Hypertension Mother   . Depression Mother   . Mental illness Son   . Aneurysm Maternal Grandmother   . Thyroid disease Maternal Grandmother   . Stroke Maternal Grandfather   . Hypertension Maternal Grandfather   . Diabetes Maternal Grandfather   . Heart disease Maternal Grandfather     MI  . Nephrolithiasis Daughter     Social History Social History  Substance Use Topics  . Smoking status: Current Every Day Smoker -- 0.25 packs/day for 44 years    Types: Cigarettes    Start date: 10/04/1970  . Smokeless tobacco: Never Used  . Alcohol Use: No     Comment: 1 drink every 2-3 times/year    Review of Systems Constitutional: No fever/chills Eyes: No visual changes. ENT: No sore throat. Cardiovascular: Right, lateral chest  pain Respiratory: shortness of breath. Gastrointestinal: No abdominal pain.  No nausea, no vomiting.  No diarrhea.  No constipation. Genitourinary: Negative for dysuria. Musculoskeletal: Negative for back pain. Skin: Negative for rash. Neurological: Negative for headaches, focal weakness or numbness.  10-point ROS otherwise negative.  ____________________________________________   PHYSICAL EXAM:  VITAL SIGNS: ED Triage Vitals  Enc Vitals Group     BP 03/05/15 0055 128/68 mmHg     Pulse Rate 03/05/15 0055 85     Resp 03/05/15 0055 24     Temp 03/05/15 0055 100.1 F (37.8 C)     Temp Source 03/05/15 0055 Oral     SpO2 03/05/15 0055 92 %     Weight 03/05/15 0055 190 lb (86.183 kg)     Height 03/05/15 0055 '5\' 6"'$  (1.676 m)     Head Cir --      Peak Flow --      Pain Score 03/05/15 0056 10     Pain Loc --      Pain Edu? --      Excl. in [place]? --     Constitutional: Alert and oriented. Well appearing and in moderate distress. Eyes: Conjunctivae are normal. PERRL. EOMI. Head: Atraumatic. Nose: No congestion/rhinnorhea. Mouth/Throat: Mucous membranes are moist.  Oropharynx non-erythematous. Cardiovascular: Normal rate, regular rhythm. Grossly normal heart sounds.  Good peripheral circulation. Respiratory: Increased respiratory effort.  No retractions. Initial breath sounds in bilateral bases. Gastrointestinal: Soft and nontender. No distention. Positive bowel sounds Musculoskeletal: No lower extremity tenderness nor edema.   Neurologic:  Normal speech and language. No gross focal neurologic deficits are appreciated.  Skin:  Skin is warm, dry and intact.  Psychiatric: Mood and affect are normal.   ____________________________________________   LABS (all labs ordered are listed, but only abnormal results are displayed)  Labs Reviewed  CBC WITH DIFFERENTIAL/PLATELET - Abnormal; Notable for the following:    RBC 3.68 (*)    Hemoglobin 11.6 (*)    HCT 34.2 (*)    RDW 16.2  (*)    Neutro Abs 7.7 (*)    Lymphs Abs 0.7 (*)    All other components within normal limits  COMPREHENSIVE METABOLIC PANEL - Abnormal; Notable for the following:    Glucose, Bld 107 (*)    Creatinine, Ser 1.03 (*)    Calcium 8.3 (*)    Total Protein 6.1 (*)    Albumin 3.0 (*)    GFR calc non Af Amer 59 (*)    All other components within normal limits  BLOOD GAS, VENOUS - Abnormal; Notable for the following:    Bicarbonate 30.5 (*)    Acid-Base  Excess 3.7 (*)    All other components within normal limits  CULTURE, BLOOD (ROUTINE X 2)  CULTURE, BLOOD (ROUTINE X 2)  TROPONIN I   ____________________________________________  EKG  ED ECG REPORT I, Loney Hering, the attending physician, personally viewed and interpreted this ECG.   Date: 03/05/2015  EKG Time: 120  Rate: 84  Rhythm: normal sinus rhythm  Axis: none  Intervals:none  ST&T Change: none  ____________________________________________  RADIOLOGY  CXR: Bibasilar, subsegmental atelectasis/scarring versus pneumonia  CT angio chest: No evidence of pulmonary embolus, small right pleural effusion with right basilar airspace opacification concerning for pneumonia. Mild left basilar atelectasis noted, small amount of ascites within the upper abdomen. Chronic compression deformities at T6, T9, T10 and T12 grossly stable from prior study. ____________________________________________   PROCEDURES  Procedure(s) performed: None  Critical Care performed: No  ____________________________________________   INITIAL IMPRESSION / ASSESSMENT AND PLAN / ED COURSE  Pertinent labs & imaging results that were available during my care of the patient were reviewed by me and considered in my medical decision making (see chart for details).  This is a 60 year old female who comes into the hospital today with shortness of breath. The patient was seen in the hospital previously. She does appear to have some effusions bilaterally  on her chest x-ray. Since she is having pleuritic chest pain Ebony Hail her for a CT angio to evaluate for pulmonary embolus. I will reassess the patient once I received her results. Patient did have some hypoxia to 88% in the emergency department. He was placed on 2 L with some improvement in her oxygen saturation.  Since the patient was recently in the hospital or treat her with some levofloxacin and some Zosyn. I will admit the patient as she is hypoxic here in the emergency department to 88% on room air. I discussed her case with the admitting doctors and they will accept her to the hospital.  ___________________________________________   FINAL CLINICAL IMPRESSION(S) / ED DIAGNOSES  Final diagnoses:  Dyspnea  Hypoxia  Healthcare-associated pneumonia      Loney Hering, MD 03/05/15 [phone]

## 2015-03-05 NOTE — Care Management (Signed)
Per previous RNCM note--O2 assessment needed for home O2 if necessary: Admitted to [facility] with the diagnosis of COPD. Lives with the family [name] and [name] for the last 2.5 months. Address: [address] Telephone: [phone]. Daughters are [name] [phone]) and [name] [phone]). Last seen [name] 02/06/15. [facility] in May 2016. No skilled facility. Uses a shower chair in the home. Uses a rolling walker to aid in ambulation. Last BM on Sunday. Takes care of all basic activities of daily living herself. Ms. [name] helps with the errands. Goes to the [employer] on Sunday for meals. [employer]). No falls. Appetite good/bad. Ms. [name] will transport. Shelbie Ammons RN MSN CCM Care Management [phone].

## 2015-03-05 NOTE — ED Notes (Signed)
Patient denies pain and is resting comfortably.  

## 2015-03-05 NOTE — ED Notes (Signed)
Patient is resting comfortably. 

## 2015-03-05 NOTE — ED Notes (Signed)
Pt was discharged from the hospital yesterday, pt was admitted with pneumonia, pt is here with sob and rt sided chest pain, states that she is taking her pain medication but cont to hurt, pt is pale, ems reports o2 sat on ra at 88-90%

## 2015-03-05 NOTE — Consult Note (Signed)
ANTICOAGULATION CONSULT NOTE - Initial Consult  Pharmacy Consult for Warfarin Indication: H/o chronic DVT  Allergies  Allergen Reactions  . Sulfa Antibiotics Hives    Patient Measurements: Height: '5\' 6"'$  (167.6 cm) Weight: 205 lb 3.2 oz (93.078 kg) IBW/kg (Calculated) : 59.3  Vital Signs: Temp: 99.1 F (37.3 C) (02/27 1352) Temp Source: Oral (02/27 1352) BP: 107/60 mmHg (02/27 1352) Pulse Rate: 73 (02/27 1352)  Labs:  Recent Labs  03/03/15 0532 03/05/15 0100  HGB  --  11.6*  HCT  --  34.2*  PLT  --  202  LABPROT 24.2*  --   INR 2.19  --   CREATININE  --  1.03*  TROPONINI  --  <0.03    Estimated Creatinine Clearance: 68.4 mL/min (by C-G formula based on Cr of 1.03).   Medical History: Past Medical History  Diagnosis Date  . Schizophrenia ([place])   . Asthma   . GERD (gastroesophageal reflux disease)   . Anxiety   . Depression   . Bipolar disorder ([place])   . COPD (chronic obstructive pulmonary disease) ([place])   . Occasional tremors     right hand  . PTSD (post-traumatic stress disorder)   . Shortness of breath dyspnea   . Fibromyalgia   . DVT (deep venous thrombosis) ([place]) 2011    RUE  . Thyroid nodule   . DDD (degenerative disc disease), lumbar   . Spinal stenosis   . Peripheral neuropathy ([place])   . Rotator cuff tear     right  . Pneumonia 2011  . Hypothyroidism     no meds currently  . Anemia     during pregnancy only  . Hypertension     Off meds x 15 years-well controlled now per pt  . Squamous cell cancer, anus (HCC)   . Severe obstructive sleep apnea 06/27/2014  . DVT of upper extremity (deep vein thrombosis) ([place]) 10/14/2014    Medications:  Scheduled:  . albuterol  2.5 mg Nebulization Q4H  . ARIPiprazole  2 mg Oral Daily  . baclofen  10 mg Oral TID  . calcium-vitamin D  1 tablet Oral BID  . docusate sodium  100 mg Oral Daily  . DULoxetine  60 mg Oral QHS  . escitalopram  20 mg Oral Daily  . mometasone-formoterol  2 puff Inhalation BID   . nicotine  21 mg Transdermal Daily  . pantoprazole  40 mg Oral BID  . predniSONE  50 mg Oral Q breakfast  . pregabalin  200 mg Oral BID  . sodium chloride flush  3 mL Intravenous Q12H  . tiotropium  18 mcg Inhalation Daily  . warfarin  3 mg Oral QPM  . warfarin  5 mg Oral QPM   Infusions:  . sodium chloride 100 mL/hr at 03/05/15 0645    Assessment: AR is a 60 yo female admitted after recent hospital discharge with SOB. Pharmacy consulted to manage warfarin in this patient with a history of chronic DVT.  Goal of Therapy:  INR 2-3 Monitor platelets by anticoagulation protocol: Yes   Plan:  Patient on warfarin '8mg'$  nightly PTA. Will continue home dose for now, follow up on INR tomorrow AM.   Of note, patient received Levaquin in ED which could affect INR.  Vena Rua 03/05/2015,2:43 PM

## 2015-03-05 NOTE — Progress Notes (Signed)
[facility] [place] NAME: [name]    MR#:  [id number]  DATE OF BIRTH:  [birth year]  SUBJECTIVE:  CHIEF COMPLAINT:  Patient is resting comfortably. Denies any  shortness of breath but reports right pleuritic chest pain with deep inspiration. CT chest with no pulmonary embolism  REVIEW OF SYSTEMS:  CONSTITUTIONAL: No fever, fatigue or weakness.  EYES: No blurred or double vision.  EARS, NOSE, AND THROAT: No tinnitus or ear pain.  RESPIRATORY: Reporting cough, shortness of breath, denies wheezing or hemoptysis.  CARDIOVASCULAR: No chest pain, orthopnea, edema.  GASTROINTESTINAL: No nausea, vomiting, diarrhea or abdominal pain.  GENITOURINARY: No dysuria, hematuria.  ENDOCRINE: No polyuria, nocturia,  HEMATOLOGY: No anemia, easy bruising or bleeding SKIN: No rash or lesion. MUSCULOSKELETAL: No joint pain or arthritis.   NEUROLOGIC: No tingling, numbness, weakness.  PSYCHIATRY: No anxiety or depression.   DRUG ALLERGIES:   Allergies  Allergen Reactions  . Sulfa Antibiotics Hives    VITALS:  Blood pressure 107/60, pulse 73, temperature 99.1 F (37.3 C), temperature source Oral, resp. rate 20, height '5\' 6"'$  (1.676 m), weight 93.078 kg (205 lb 3.2 oz), SpO2 95 %.  PHYSICAL EXAMINATION:  GENERAL:  60 y.o.-year-old patient lying in the bed with no acute distress.  EYES: Pupils equal, round, reactive to light and accommodation. No scleral icterus. Extraocular muscles intact.  HEENT: Head atraumatic, normocephalic. Oropharynx and nasopharynx clear.  NECK:  Supple, no jugular venous distention. No thyroid enlargement, no tenderness.  LUNGS: Diminished right-sided breath sounds , has some crackles, no wheezing, rales,rhonchi or crepitation. No use of accessory muscles of respiration.  CARDIOVASCULAR: S1, S2 normal. No murmurs, rubs, or gallops.  ABDOMEN: Soft, nontender, nondistended. Bowel sounds present. No organomegaly or mass.   EXTREMITIES: No pedal edema, cyanosis, or clubbing.  NEUROLOGIC: Cranial nerves II through XII are intact. Muscle strength 5/5 in all extremities. Sensation intact. Gait not checked.  PSYCHIATRIC: The patient is alert and oriented x 3.  SKIN: No obvious rash, lesion, or ulcer.    LABORATORY PANEL:   CBC  Recent Labs Lab 03/05/15 0100  WBC 9.3  HGB 11.6*  HCT 34.2*  PLT 202   ------------------------------------------------------------------------------------------------------------------  Chemistries   Recent Labs Lab 03/05/15 0100  NA 139  K 3.9  CL 105  CO2 29  GLUCOSE 107*  BUN 19  CREATININE 1.03*  CALCIUM 8.3*  AST 34  ALT 27  ALKPHOS 83  BILITOT 0.8   ------------------------------------------------------------------------------------------------------------------  Cardiac Enzymes  Recent Labs Lab 03/05/15 0100  TROPONINI <0.03   ------------------------------------------------------------------------------------------------------------------  RADIOLOGY:  Dg Chest 2 View  03/05/2015  CLINICAL DATA:  60 year old female with shortness of breath. Patient was recently discharged from the hospital with admission diagnosis of pneumonia. EXAM: CHEST  2 VIEW COMPARISON:  Radiograph dated 03/03/2015 and CT dated 10/22/2014 FINDINGS: Two views of the chest demonstrate bibasilar subsegmental densities, likely atelectasis/scarring. Pneumonia is not excluded. Clinical correlation is recommended. There is blunting of the right costophrenic angle which may be related to underlying atelectasis/scarring or represent a small right pleural effusion. No pneumothorax. Stable cardiac silhouette. Multiple lower thoracic compression fractures as seen on the prior CT. IMPRESSION: Bibasilar subsegmental atelectasis/ scarring versus pneumonia. Clinical correlation and follow-up recommended. Electronically Signed   By: Anner Crete M.D.   On: 03/05/2015 01:56   Ct Angio Chest Pe  W/cm &/or Wo Cm  03/05/2015  CLINICAL DATA:  Acute onset of shortness of breath and right-sided chest pain.  Initial encounter. EXAM: CT ANGIOGRAPHY CHEST WITH CONTRAST TECHNIQUE: Multidetector CT imaging of the chest was performed using the standard protocol during bolus administration of intravenous contrast. Multiplanar CT image reconstructions and MIPs were obtained to evaluate the vascular anatomy. CONTRAST:  44m OMNIPAQUE IOHEXOL 350 MG/ML SOLN COMPARISON:  Chest radiograph performed earlier today at 1:24 a.m., and CTA of the chest performed 10/22/2014 FINDINGS: There is no evidence of pulmonary embolus. A small right pleural effusion is noted, somewhat loculated in appearance, with right basilar airspace opacification, concerning for pneumonia. Mild left basilar atelectasis is noted. There is no evidence of pneumothorax. No masses are identified; no abnormal focal contrast enhancement is seen. The mediastinum is unremarkable in appearance. No mediastinal lymphadenopathy is seen. No pericardial effusion is identified. The great vessels are grossly unremarkable. No axillary lymphadenopathy is seen. The visualized portions of the thyroid gland are unremarkable in appearance. A small amount of ascites is noted within the upper abdomen, surrounding the liver and spleen. The visualized portions of the gallbladder, pancreas and adrenal glands are unremarkable. No acute osseous abnormalities are seen. Chronic compression deformities are noted at T6, T9, T10 and T12. Review of the MIP images confirms the above findings. IMPRESSION: 1. No evidence of pulmonary embolus. 2. Small right pleural effusion, with right basilar airspace opacification, concerning for pneumonia. 3. Mild left basilar atelectasis noted. 4. Small amount of ascites within the upper abdomen. 5. Chronic compression deformities at T6, T9, T10 and T12, grossly stable from the prior study. Electronically Signed   By: JGarald BaldingM.D.   On: 03/05/2015  03:46    EKG:   Orders placed or performed during the hospital encounter of 03/05/15  . EKG 12-Lead  . EKG 12-Lead  . ED EKG  . ED EKG    ASSESSMENT AND PLAN:    This is a 60year old female with COPD admitted for healthcare associated pneumonia. 1. Pneumonia: Healthcare associated.  Continue Zosyn and Levaquin   on non-tapering dose of steroid.   2. COPD: Continue inhalers per home regimen  3. Essential hypertension: Controlled  4. Mood disorder: Continue Abilify, Cymbalta and Lexapro  5. Chronic DVT: Continue warfarin, consult pharmacy regarding Coumadin management  6. GI prophylaxis: Pantoprazole     All the records are reviewed and case discussed with Care Management/Social Workerr. Management plans discussed with the patient, family and they are in agreement.  CODE STATUS: fc  TOTAL TIME TAKING CARE OF THIS PATIENT: 35 minutes.   POSSIBLE D/C IN 2-3  DAYS, DEPENDING ON CLINICAL CONDITION.   GNicholes MangoM.D on 03/05/2015 at 3:02 PM  Between 7am to 6pm - Pager - [phone] 6pm go to [email or web address] - password [email or web address]  Office  [phone] CC: Primary care physician; CKathrine Haddock NP

## 2015-03-06 ENCOUNTER — Inpatient Hospital Stay: Payer: Commercial Managed Care - HMO | Admitting: Hematology and Oncology

## 2015-03-06 ENCOUNTER — Inpatient Hospital Stay: Payer: Commercial Managed Care - HMO

## 2015-03-06 LAB — BASIC METABOLIC PANEL
ANION GAP: 6 (ref 5–15)
BUN: 17 mg/dL (ref 6–20)
CO2: 28 mmol/L (ref 22–32)
Calcium: 8.1 mg/dL — ABNORMAL LOW (ref 8.9–10.3)
Chloride: 106 mmol/L (ref 101–111)
Creatinine, Ser: 0.88 mg/dL (ref 0.44–1.00)
GFR calc Af Amer: >60 mL/min (ref >60)
Glucose, Bld: 122 mg/dL — ABNORMAL HIGH (ref 65–99)
POTASSIUM: 4 mmol/L (ref 3.5–5.1)
SODIUM: 140 mmol/L (ref 135–145)

## 2015-03-06 LAB — CBC
HCT: 28.2 % — ABNORMAL LOW (ref 35.0–47.0)
Hemoglobin: 9.4 g/dL — ABNORMAL LOW (ref 12.0–16.0)
MCH: 31.3 pg (ref 26.0–34.0)
MCHC: 33.4 g/dL (ref 32.0–36.0)
MCV: 93.9 fL (ref 80.0–100.0)
PLATELETS: 117 10*3/uL — AB (ref 150–440)
RBC: 3 MIL/uL — AB (ref 3.80–5.20)
RDW: 16.1 % — ABNORMAL HIGH (ref 11.5–14.5)
WBC: 5.2 10*3/uL (ref 3.6–11.0)

## 2015-03-06 LAB — PROTIME-INR
INR: 1.59
PROTHROMBIN TIME: 19 s — AB (ref 11.4–15.0)

## 2015-03-06 NOTE — Consult Note (Signed)
ANTICOAGULATION CONSULT NOTE - Initial Consult  Pharmacy Consult for Warfarin Indication: H/o chronic DVT  Allergies  Allergen Reactions  . Sulfa Antibiotics Hives    Patient Measurements: Height: '5\' 6"'$  (167.6 cm) Weight: 207 lb 4.8 oz (94.031 kg) IBW/kg (Calculated) : 59.3  Vital Signs: Temp: 98.1 F (36.7 C) (02/28 0434) Temp Source: Oral (02/28 0434) BP: 100/48 mmHg (02/28 0434) Pulse Rate: 75 (02/28 0434)  Labs:  Recent Labs  03/05/15 0100 03/06/15 0552  HGB 11.6* 9.4*  HCT 34.2* 28.2*  PLT 202 117*  LABPROT  --  19.0*  INR  --  1.59  CREATININE 1.03* 0.88  TROPONINI <0.03  --     Estimated Creatinine Clearance: 80.5 mL/min (by C-G formula based on Cr of 0.88).   Medical History: Past Medical History  Diagnosis Date  . Schizophrenia ([place])   . Asthma   . GERD (gastroesophageal reflux disease)   . Anxiety   . Depression   . Bipolar disorder ([place])   . COPD (chronic obstructive pulmonary disease) ([place])   . Occasional tremors     right hand  . PTSD (post-traumatic stress disorder)   . Shortness of breath dyspnea   . Fibromyalgia   . DVT (deep venous thrombosis) ([place]) 2011    RUE  . Thyroid nodule   . DDD (degenerative disc disease), lumbar   . Spinal stenosis   . Peripheral neuropathy ([place])   . Rotator cuff tear     right  . Pneumonia 2011  . Hypothyroidism     no meds currently  . Anemia     during pregnancy only  . Hypertension     Off meds x 15 years-well controlled now per pt  . Squamous cell cancer, anus ([facility])   . Severe obstructive sleep apnea 06/27/2014  . DVT of upper extremity (deep vein thrombosis) ([place]) 10/14/2014    Medications:  Scheduled:  . albuterol  2.5 mg Nebulization Q4H  . ARIPiprazole  2 mg Oral Daily  . baclofen  10 mg Oral TID  . calcium-vitamin D  1 tablet Oral BID  . docusate sodium  100 mg Oral Daily  . DULoxetine  60 mg Oral QHS  . escitalopram  20 mg Oral Daily  . mometasone-formoterol  2 puff Inhalation BID   . nicotine  21 mg Transdermal Daily  . pantoprazole  40 mg Oral BID  . predniSONE  50 mg Oral Q breakfast  . pregabalin  200 mg Oral BID  . sodium chloride flush  3 mL Intravenous Q12H  . tiotropium  18 mcg Inhalation Daily  . warfarin  8 mg Oral q1800  . Warfarin - Pharmacist Dosing Inpatient   Does not apply q1800   Infusions:  . sodium chloride 100 mL/hr at 03/06/15 0141    Assessment: AR is a 60 yo female admitted after recent hospital discharge with SOB. Pharmacy consulted to manage warfarin in this patient with a history of chronic DVT. INR on admit 2.19  Goal of Therapy:  INR 2-3 Monitor platelets by anticoagulation protocol: Yes   Plan:  Patient on warfarin '8mg'$  nightly PTA.   Of note, patient received Levaquin in ED which could affect INR.  2/28 INR 1.59, continue home regimen of warfarin '8mg'$  daily. Follow up on AM labs.  Vena Rua 03/06/2015,7:21 AM

## 2015-03-06 NOTE — Progress Notes (Signed)
[place] [place] NAME: [name]    MR#:  [id number]  DATE OF BIRTH:  Oct 20, 1955  SUBJECTIVE:  CHIEF COMPLAINT:  Patient reports right pleuritic chest pain with deep inspiration. CT chest with no pulmonary embolism  REVIEW OF SYSTEMS:  CONSTITUTIONAL: No fever, fatigue or weakness.  EYES: No blurred or double vision.  EARS, NOSE, AND THROAT: No tinnitus or ear pain.  RESPIRATORY: Reporting cough, shortness of breath, denies wheezing or hemoptysis.  CARDIOVASCULAR: No chest pain, orthopnea, edema.  GASTROINTESTINAL: No nausea, vomiting, diarrhea or abdominal pain.  GENITOURINARY: No dysuria, hematuria.  ENDOCRINE: No polyuria, nocturia,  HEMATOLOGY: No anemia, easy bruising or bleeding SKIN: No rash or lesion. MUSCULOSKELETAL: No joint pain or arthritis.   NEUROLOGIC: No tingling, numbness, weakness.  PSYCHIATRY: No anxiety or depression.   DRUG ALLERGIES:   Allergies  Allergen Reactions  . Sulfa Antibiotics Hives    VITALS:  Blood pressure 108/54, pulse 70, temperature 97.5 F (36.4 C), temperature source Oral, resp. rate 20, height '5\' 6"'$  (1.676 m), weight 94.031 kg (207 lb 4.8 oz), SpO2 100 %.  PHYSICAL EXAMINATION:  GENERAL:  60 y.o.-year-old patient lying in the bed with no acute distress.  EYES: Pupils equal, round, reactive to light and accommodation. No scleral icterus. Extraocular muscles intact.  HEENT: Head atraumatic, normocephalic. Oropharynx and nasopharynx clear.  NECK:  Supple, no jugular venous distention. No thyroid enlargement, no tenderness.  LUNGS: Diminished right-sided breath sounds , has some crackles, no wheezing, rales,rhonchi or crepitation. No use of accessory muscles of respiration.  CARDIOVASCULAR: S1, S2 normal. No murmurs, rubs, or gallops.  ABDOMEN: Soft, nontender, nondistended. Bowel sounds present. Colostomy bag is intact  EXTREMITIES: No pedal edema, cyanosis, or clubbing.   NEUROLOGIC: Cranial nerves II through XII are intact. Muscle strength 5/5 in all extremities. Sensation intact. Gait not checked.  PSYCHIATRIC: The patient is alert and oriented x 3.  SKIN: No obvious rash, lesion, or ulcer.    LABORATORY PANEL:   CBC  Recent Labs Lab 03/06/15 0552  WBC 5.2  HGB 9.4*  HCT 28.2*  PLT 117*   ------------------------------------------------------------------------------------------------------------------  Chemistries   Recent Labs Lab 03/05/15 0100 03/06/15 0552  NA 139 140  K 3.9 4.0  CL 105 106  CO2 29 28  GLUCOSE 107* 122*  BUN 19 17  CREATININE 1.03* 0.88  CALCIUM 8.3* 8.1*  AST 34  --   ALT 27  --   ALKPHOS 83  --   BILITOT 0.8  --    ------------------------------------------------------------------------------------------------------------------  Cardiac Enzymes  Recent Labs Lab 03/05/15 0100  TROPONINI <0.03   ------------------------------------------------------------------------------------------------------------------  RADIOLOGY:  Dg Chest 2 View  03/05/2015  CLINICAL DATA:  60 year old female with shortness of breath. Patient was recently discharged from the hospital with admission diagnosis of pneumonia. EXAM: CHEST  2 VIEW COMPARISON:  Radiograph dated 03/03/2015 and CT dated 10/22/2014 FINDINGS: Two views of the chest demonstrate bibasilar subsegmental densities, likely atelectasis/scarring. Pneumonia is not excluded. Clinical correlation is recommended. There is blunting of the right costophrenic angle which may be related to underlying atelectasis/scarring or represent a small right pleural effusion. No pneumothorax. Stable cardiac silhouette. Multiple lower thoracic compression fractures as seen on the prior CT. IMPRESSION: Bibasilar subsegmental atelectasis/ scarring versus pneumonia. Clinical correlation and follow-up recommended. Electronically Signed   By: Anner Crete M.D.   On: 03/05/2015 01:56   Ct  Angio Chest Pe W/cm &/or Wo Cm  03/05/2015  CLINICAL  DATA:  Acute onset of shortness of breath and right-sided chest pain. Initial encounter. EXAM: CT ANGIOGRAPHY CHEST WITH CONTRAST TECHNIQUE: Multidetector CT imaging of the chest was performed using the standard protocol during bolus administration of intravenous contrast. Multiplanar CT image reconstructions and MIPs were obtained to evaluate the vascular anatomy. CONTRAST:  31m OMNIPAQUE IOHEXOL 350 MG/ML SOLN COMPARISON:  Chest radiograph performed earlier today at 1:24 a.m., and CTA of the chest performed 10/22/2014 FINDINGS: There is no evidence of pulmonary embolus. A small right pleural effusion is noted, somewhat loculated in appearance, with right basilar airspace opacification, concerning for pneumonia. Mild left basilar atelectasis is noted. There is no evidence of pneumothorax. No masses are identified; no abnormal focal contrast enhancement is seen. The mediastinum is unremarkable in appearance. No mediastinal lymphadenopathy is seen. No pericardial effusion is identified. The great vessels are grossly unremarkable. No axillary lymphadenopathy is seen. The visualized portions of the thyroid gland are unremarkable in appearance. A small amount of ascites is noted within the upper abdomen, surrounding the liver and spleen. The visualized portions of the gallbladder, pancreas and adrenal glands are unremarkable. No acute osseous abnormalities are seen. Chronic compression deformities are noted at T6, T9, T10 and T12. Review of the MIP images confirms the above findings. IMPRESSION: 1. No evidence of pulmonary embolus. 2. Small right pleural effusion, with right basilar airspace opacification, concerning for pneumonia. 3. Mild left basilar atelectasis noted. 4. Small amount of ascites within the upper abdomen. 5. Chronic compression deformities at T6, T9, T10 and T12, grossly stable from the prior study. Electronically Signed   By: JGarald BaldingM.D.    On: 03/05/2015 03:46    EKG:   Orders placed or performed during the hospital encounter of 03/05/15  . EKG 12-Lead  . EKG 12-Lead  . ED EKG  . ED EKG    ASSESSMENT AND PLAN:    This is a 60year old female with COPD admitted for healthcare associated pneumonia. 1. Pneumonia: Healthcare associated.  Clinically improving Pain management as needed for pleuritic chest pain from pneumonia Continue Zosyn and Levaquin   on non-tapering dose of steroid.  Check a.m. labs  2. COPD: Continue inhalers per home regimen  3. Essential hypertension: Controlled  4. Mood disorder: Continue Abilify, Cymbalta and Lexapro  5. Chronic DVT: Continue warfarin, consult pharmacy regarding Coumadin management  6. GI prophylaxis: Pantoprazole     All the records are reviewed and case discussed with Care Management/Social Workerr. Management plans discussed with the patient, family and they are in agreement.  CODE STATUS: fc  TOTAL TIME TAKING CARE OF THIS PATIENT: 35 minutes.   POSSIBLE D/C IN 2-3  DAYS, DEPENDING ON CLINICAL CONDITION.   GNicholes MangoM.D on 03/06/2015 at 3:49 PM  Between 7am to 6pm - Pager - [phone] 6pm go to [email or web address] - password [email or web address]  Office  [phone] CC: Primary care physician; CKathrine Haddock NP

## 2015-03-07 LAB — PROTIME-INR
INR: 2.02
PROTHROMBIN TIME: 22.7 s — AB (ref 11.4–15.0)

## 2015-03-07 MED ORDER — PIPERACILLIN-TAZOBACTAM 3.375 G IVPB
3.3750 g | Freq: Three times a day (TID) | INTRAVENOUS | Status: DC
  Administered 2015-03-07: 3.375 g via INTRAVENOUS
  Filled 2015-03-07 (×6): qty 50

## 2015-03-07 MED ORDER — LEVOFLOXACIN IN D5W 750 MG/150ML IV SOLN
750.0000 mg | INTRAVENOUS | Status: DC
  Administered 2015-03-07: 09:00:00 750 mg via INTRAVENOUS
  Filled 2015-03-07 (×2): qty 150

## 2015-03-07 MED ORDER — WARFARIN SODIUM 4 MG PO TABS: 7.0000 mg | ORAL_TABLET | Freq: Once | ORAL | Status: DC

## 2015-03-07 MED ORDER — LEVOFLOXACIN 750 MG PO TABS
750.0000 mg | ORAL_TABLET | Freq: Every day | ORAL | Status: DC
Start: 2015-03-07 — End: 2015-03-14

## 2015-03-07 MED ORDER — OXYCODONE-ACETAMINOPHEN 10-325 MG PO TABS: 1.0000 | ORAL_TABLET | Freq: Four times a day (QID) | ORAL | Status: DC | PRN

## 2015-03-07 MED ORDER — PREDNISONE 10 MG (21) PO TBPK: 10.0000 mg | ORAL_TABLET | Freq: Every day | ORAL | Status: DC

## 2015-03-07 NOTE — Progress Notes (Signed)
Pt for discharge home. Alert. No resp distress. sats 93 %on room air , instructions discussed wit pt. presc given and discussed.  Home meds discussed. Diet/ activity and f/u discussed. Verbalized understanding.  Sl  D/cd.  Up and about in room and to br.  Waiting for friend to pick her up.

## 2015-03-07 NOTE — Discharge Instructions (Signed)
Activity as tolerated Diet low-salt Follow-up with primary care physician in a week Follow-up with oncology Dr. Mike Gip in 5 days Stop taking Coumadin

## 2015-03-07 NOTE — Discharge Summary (Signed)
[name] [place] NAME: [name]    MR#:  [id number]  DATE OF BIRTH:  12-10-1955  DATE OF ADMISSION:  03/05/2015 ADMITTING PHYSICIAN: [name], MD  DATE OF DISCHARGE: 03/07/2015  PRIMARY CARE PHYSICIAN: [name], NP    ADMISSION DIAGNOSIS:  Dyspnea [R06.00] Hypoxia [R09.02] Healthcare-associated pneumonia [J18.9]  DISCHARGE DIAGNOSIS:  Active Problems:   HCAP (healthcare-associated pneumonia)   SECONDARY DIAGNOSIS:   Past Medical History  Diagnosis Date  . Schizophrenia ([name])   . Asthma   . GERD (gastroesophageal reflux disease)   . Anxiety   . Depression   . Bipolar disorder ([name])   . COPD (chronic obstructive pulmonary disease) ([name])   . Occasional tremors     right hand  . PTSD (post-traumatic stress disorder)   . Shortness of breath dyspnea   . Fibromyalgia   . DVT (deep venous thrombosis) ([name]) 2011    RUE  . Thyroid nodule   . DDD (degenerative disc disease), lumbar   . Spinal stenosis   . Peripheral neuropathy ([name])   . Rotator cuff tear     right  . Pneumonia 2011  . Hypothyroidism     no meds currently  . Anemia     during pregnancy only  . Hypertension     Off meds x 15 years-well controlled now per pt  . Squamous cell cancer, anus (HCC)   . Severe obstructive sleep apnea 06/27/2014  . DVT of upper extremity (deep vein thrombosis) ([name]) 10/14/2014    HOSPITAL COURSE:   This is a 60 year old female with COPD admitted for healthcare associated pneumonia. 1. Pneumonia: Healthcare associated.  Clinically improving Pain management as needed for pleuritic chest pain from pneumonia Given Zosyn and Levaquin. Discharge pt with levaquin , prednisone Patient is hypoxic and needs 2 L of oxygen via nasal cannula to maintain sats at 90% or greater  2. COPD: Continue inhalers per home regimen  3. Essential hypertension: Controlled  4. Mood disorder: Continue Abilify, Cymbalta and  Lexapro  5. Chronic DVT: From Port-A-Cath placement  discontinue warfarin, the patient's oncologist Dr. Mike Gip has discussed with Dr. dew and they both are agreeable to discontinue Coumadin as the patient's clot is completely dissolved  6. GI prophylaxis: Pantoprazole    DISCHARGE CONDITIONS:   fair  CONSULTS OBTAINED:      PROCEDURES none  DRUG ALLERGIES:   Allergies  Allergen Reactions  . Sulfa Antibiotics Hives    DISCHARGE MEDICATIONS:   Current Discharge Medication List    START taking these medications   Details  levofloxacin (LEVAQUIN) 750 MG tablet Take 1 tablet (750 mg total) by mouth daily. Qty: 7 tablet, Refills: 0    oxyCODONE-acetaminophen (PERCOCET) 10-325 MG tablet Take 1 tablet by mouth every 6 (six) hours as needed for pain. Qty: 30 tablet, Refills: 0      CONTINUE these medications which have CHANGED   Details  predniSONE (STERAPRED UNI-PAK 21 TAB) 10 MG (21) TBPK tablet Take 1 tablet (10 mg total) by mouth daily. Take 6 tablets by mouth for 1 day followed by  5 tablets by mouth for 1 day followed by  4 tablets by mouth for 1 day followed by  3 tablets by mouth for 1 day followed by  2 tablets by mouth for 1 day followed by  1 tablet by mouth for a day and stop Qty: 21 tablet, Refills: 0      CONTINUE these medications  which have NOT CHANGED   Details  albuterol (PROVENTIL HFA;VENTOLIN HFA) 108 (90 BASE) MCG/ACT inhaler Inhale 2 puffs into the lungs every 6 (six) hours as needed for wheezing or shortness of breath.     albuterol (PROVENTIL) (2.5 MG/3ML) 0.083% nebulizer solution Take 3 mLs (2.5 mg total) by nebulization every 4 (four) hours. Qty: 360 vial, Refills: 12    ARIPiprazole (ABILIFY) 2 MG tablet Take 1 tablet (2 mg total) by mouth daily. Qty: 30 tablet, Refills: 4    bisacodyl (DULCOLAX) 5 MG EC tablet Take 1 tablet (5 mg total) by mouth daily as needed for moderate constipation. Qty: 30 tablet, Refills: 0     budesonide-formoterol (SYMBICORT) 160-4.5 MCG/ACT inhaler Inhale 2 puffs into the lungs 2 (two) times daily. Qty: 1 Inhaler, Refills: 12    Calcium Carb-Cholecalciferol (CALCIUM 600+D) 600-800 MG-UNIT TABS Take 1 tablet by mouth 2 (two) times daily.     clonazePAM (KLONOPIN) 1 MG tablet Take 1 tablet (1 mg total) by mouth 3 (three) times daily as needed for anxiety. Qty: 90 tablet, Refills: 4    docusate sodium (COLACE) 100 MG capsule Take 100 mg by mouth daily.    doxycycline (VIBRA-TABS) 100 MG tablet Take 1 tablet (100 mg total) by mouth every 12 (twelve) hours. Qty: 18 tablet, Refills: 0    DULoxetine (CYMBALTA) 60 MG capsule Take 60 mg by mouth at bedtime.    escitalopram (LEXAPRO) 20 MG tablet Take 1 tablet (20 mg total) by mouth daily. Qty: 30 tablet, Refills: 4    ipratropium-albuterol (DUONEB) 0.5-2.5 (3) MG/3ML SOLN Take 3 mLs by nebulization every 4 (four) hours as needed. Qty: 360 mL, Refills: 6    nicotine (NICODERM CQ - DOSED IN MG/24 HOURS) 21 mg/24hr patch Place 1 patch (21 mg total) onto the skin daily. Qty: 28 patch, Refills: 0    nicotine polacrilex (NICORETTE) 2 MG gum Take 1 each (2 mg total) by mouth as needed for smoking cessation. Qty: 100 tablet, Refills: 0    ondansetron (ZOFRAN) 4 MG tablet Take 1 tablet (4 mg total) by mouth every 8 (eight) hours as needed for nausea or vomiting. Qty: 20 tablet, Refills: 0    pantoprazole (PROTONIX) 40 MG tablet Take 40 mg by mouth 2 (two) times daily.     polyethylene glycol (MIRALAX / GLYCOLAX) packet Take 17 g by mouth daily. Qty: 14 each, Refills: 0    pregabalin (LYRICA) 200 MG capsule Take 1 capsule (200 mg total) by mouth 2 (two) times daily. Qty: 90 capsule, Refills: 6    senna-docusate (SENOKOT-S) 8.6-50 MG tablet Take 1 tablet by mouth at bedtime as needed for mild constipation. Qty: 30 tablet, Refills: 5    tiotropium (SPIRIVA) 18 MCG inhalation capsule Place 1 capsule (18 mcg total) into inhaler and  inhale daily. Qty: 30 capsule, Refills: 12    zolpidem (AMBIEN) 10 MG tablet Take 1 tablet (10 mg total) by mouth at bedtime as needed for sleep. Qty: 30 tablet, Refills: 4    baclofen (LIORESAL) 10 MG tablet Take 1 tablet (10 mg total) by mouth 3 (three) times daily. Qty: 90 each, Refills: 6      STOP taking these medications     oxyCODONE (ROXICODONE) 15 MG immediate release tablet      warfarin (COUMADIN) 3 MG tablet      warfarin (COUMADIN) 5 MG tablet          DISCHARGE INSTRUCTIONS:   Activity as tolerated Diet low-salt Follow-up  with primary care physician in a week Follow-up with oncology Dr. Mike Gip in 5 days Stop taking Coumadin   DIET:  Low salt  DISCHARGE CONDITION:  Fair  ACTIVITY:  Activity as tolerated  OXYGEN:  Home Oxygen: Yes.     Oxygen Delivery: 2 liters/min via Patient connected to nasal cannula oxygen  DISCHARGE LOCATION:  home   If you experience worsening of your admission symptoms, develop shortness of breath, life threatening emergency, suicidal or homicidal thoughts you must seek medical attention immediately by calling 911 or calling your MD immediately  if symptoms less severe.  You Must read complete instructions/literature along with all the possible adverse reactions/side effects for all the Medicines you take and that have been prescribed to you. Take any new Medicines after you have completely understood and accpet all the possible adverse reactions/side effects.   Please note  You were cared for by a hospitalist during your hospital stay. If you have any questions about your discharge medications or the care you received while you were in the hospital after you are discharged, you can call the unit and asked to speak with the hospitalist on call if the hospitalist that took care of you is not available. Once you are discharged, your primary care physician will handle any further medical issues. Please note that NO REFILLS for  any discharge medications will be authorized once you are discharged, as it is imperative that you return to your primary care physician (or establish a relationship with a primary care physician if you do not have one) for your aftercare needs so that they can reassess your need for medications and monitor your lab values.     Today  Chief Complaint  Patient presents with  . Shortness of Breath   patient is doing fine. No chest pain or shortness of breath.    ROS:  CONSTITUTIONAL: Denies fevers, chills. Denies any fatigue, weakness.  EYES: Denies blurry vision, double vision, eye pain. EARS, NOSE, THROAT: Denies tinnitus, ear pain, hearing loss. RESPIRATORY: Denies cough, wheeze, shortness of breath.  CARDIOVASCULAR: Denies chest pain, palpitations, edema.  GASTROINTESTINAL: Denies nausea, vomiting, diarrhea, abdominal pain. Denies bright red blood per rectum. GENITOURINARY: Denies dysuria, hematuria. ENDOCRINE: Denies nocturia or thyroid problems. HEMATOLOGIC AND LYMPHATIC: Denies easy bruising or bleeding. SKIN: Denies rash or lesion. MUSCULOSKELETAL: Denies pain in neck, back, shoulder, knees, hips or arthritic symptoms.  NEUROLOGIC: Denies paralysis, paresthesias.  PSYCHIATRIC: Denies anxiety or depressive symptoms.   VITAL SIGNS:  Blood pressure 117/61, pulse 79, temperature 98.1 F (36.7 C), temperature source Oral, resp. rate 20, height '5\' 6"'$  (1.676 m), weight 94.031 kg (207 lb 4.8 oz), SpO2 97 %.  I/O:   Intake/Output Summary (Last 24 hours) at 03/07/15 1446 Last data filed at 03/07/15 1300  Gross per 24 hour  Intake   1920 ml  Output    200 ml  Net   1720 ml    PHYSICAL EXAMINATION:  GENERAL:  60 y.o.-year-old patient lying in the bed with no acute distress.  EYES: Pupils equal, round, reactive to light and accommodation. No scleral icterus. Extraocular muscles intact.  HEENT: Head atraumatic, normocephalic. Oropharynx and nasopharynx clear.  NECK:  Supple, no  jugular venous distention. No thyroid enlargement, no tenderness.  LUNGS: Normal breath sounds bilaterally, no wheezing, rales,rhonchi or crepitation. No use of accessory muscles of respiration.  CARDIOVASCULAR: S1, S2 normal. No murmurs, rubs, or gallops.  ABDOMEN: Soft, non-tender, non-distended. Bowel sounds present. No organomegaly or mass.  EXTREMITIES:  No pedal edema, cyanosis, or clubbing.  NEUROLOGIC: Cranial nerves II through XII are intact. Muscle strength 5/5 in all extremities. Sensation intact. Gait not checked.  PSYCHIATRIC: The patient is alert and oriented x 3.  SKIN: No obvious rash, lesion, or ulcer.   DATA REVIEW:   CBC  Recent Labs Lab 03/06/15 0552  WBC 5.2  HGB 9.4*  HCT 28.2*  PLT 117*    Chemistries   Recent Labs Lab 03/05/15 0100 03/06/15 0552  NA 139 140  K 3.9 4.0  CL 105 106  CO2 29 28  GLUCOSE 107* 122*  BUN 19 17  CREATININE 1.03* 0.88  CALCIUM 8.3* 8.1*  AST 34  --   ALT 27  --   ALKPHOS 83  --   BILITOT 0.8  --     Cardiac Enzymes  Recent Labs Lab 03/05/15 0100  TROPONINI <0.03    Microbiology Results  Results for orders placed or performed during the hospital encounter of 03/05/15  Blood culture (routine x 2)     Status: None (Preliminary result)   Collection Time: 03/05/15  4:10 AM  Result Value Ref Range Status   Specimen Description BLOOD LEFT ASSIST CONTROL  Final   Special Requests   Final    BOTTLES DRAWN AEROBIC AND ANAEROBIC 11CCAERO,10CCANA   Culture NO GROWTH 2 DAYS  Final   Report Status PENDING  Incomplete  Blood culture (routine x 2)     Status: None (Preliminary result)   Collection Time: 03/05/15  4:10 AM  Result Value Ref Range Status   Specimen Description BLOOD RIGHT ASSIST CONTROL  Final   Special Requests   Final    BOTTLES DRAWN AEROBIC AND ANAEROBIC 11CCAERO,12CCANA   Culture NO GROWTH 2 DAYS  Final   Report Status PENDING  Incomplete    RADIOLOGY:  Dg Chest 2 View  03/05/2015  CLINICAL  DATA:  60 year old female with shortness of breath. Patient was recently discharged from the hospital with admission diagnosis of pneumonia. EXAM: CHEST  2 VIEW COMPARISON:  Radiograph dated 03/03/2015 and CT dated 10/22/2014 FINDINGS: Two views of the chest demonstrate bibasilar subsegmental densities, likely atelectasis/scarring. Pneumonia is not excluded. Clinical correlation is recommended. There is blunting of the right costophrenic angle which may be related to underlying atelectasis/scarring or represent a small right pleural effusion. No pneumothorax. Stable cardiac silhouette. Multiple lower thoracic compression fractures as seen on the prior CT. IMPRESSION: Bibasilar subsegmental atelectasis/ scarring versus pneumonia. Clinical correlation and follow-up recommended. Electronically Signed   By: Anner Crete M.D.   On: 03/05/2015 01:56   Ct Angio Chest Pe W/cm &/or Wo Cm  03/05/2015  CLINICAL DATA:  Acute onset of shortness of breath and right-sided chest pain. Initial encounter. EXAM: CT ANGIOGRAPHY CHEST WITH CONTRAST TECHNIQUE: Multidetector CT imaging of the chest was performed using the standard protocol during bolus administration of intravenous contrast. Multiplanar CT image reconstructions and MIPs were obtained to evaluate the vascular anatomy. CONTRAST:  65m OMNIPAQUE IOHEXOL 350 MG/ML SOLN COMPARISON:  Chest radiograph performed earlier today at 1:24 a.m., and CTA of the chest performed 10/22/2014 FINDINGS: There is no evidence of pulmonary embolus. A small right pleural effusion is noted, somewhat loculated in appearance, with right basilar airspace opacification, concerning for pneumonia. Mild left basilar atelectasis is noted. There is no evidence of pneumothorax. No masses are identified; no abnormal focal contrast enhancement is seen. The mediastinum is unremarkable in appearance. No mediastinal lymphadenopathy is seen. No pericardial effusion is identified. The  great vessels are  grossly unremarkable. No axillary lymphadenopathy is seen. The visualized portions of the thyroid gland are unremarkable in appearance. A small amount of ascites is noted within the upper abdomen, surrounding the liver and spleen. The visualized portions of the gallbladder, pancreas and adrenal glands are unremarkable. No acute osseous abnormalities are seen. Chronic compression deformities are noted at T6, T9, T10 and T12. Review of the MIP images confirms the above findings. IMPRESSION: 1. No evidence of pulmonary embolus. 2. Small right pleural effusion, with right basilar airspace opacification, concerning for pneumonia. 3. Mild left basilar atelectasis noted. 4. Small amount of ascites within the upper abdomen. 5. Chronic compression deformities at T6, T9, T10 and T12, grossly stable from the prior study. Electronically Signed   By: Garald Balding M.D.   On: 03/05/2015 03:46    EKG:   Orders placed or performed during the hospital encounter of 03/05/15  . EKG 12-Lead  . EKG 12-Lead  . ED EKG  . ED EKG      Management plans discussed with the patient, family and they are in agreement.  CODE STATUS:     Code Status Orders        Start     Ordered   03/05/15 0618  Full code   Continuous     03/05/15 0617    Code Status History    Date Active Date Inactive Code Status Order ID Comments User Context   02/26/2015  5:43 PM 03/03/2015  6:54 PM Full Code [id number]  Bettey Costa, MD Inpatient   10/14/2014 10:59 PM 10/17/2014  4:34 PM Full Code [id number]  Lytle Butte, MD ED   05/17/2014  8:38 PM 05/30/2014  4:55 PM Full Code [id number]  Fritzi Mandes, MD Inpatient    Advance Directive Documentation        Most Recent Value   Type of Advance Directive  Healthcare Power of Attorney   Pre-existing out of facility DNR order (yellow form or pink MOST form)     "MOST" Form in Place?        TOTAL TIME TAKING CARE OF THIS PATIENT: 45  minutes.    '@[facility]'$ @  on 03/07/2015 at 2:46 PM  Between 7am to  6pm - Pager - [phone]  After 6pm go to [email or web address] - password [email or web address] [facility]  Office  [phone]  CC: Primary care physician; [name], NP

## 2015-03-07 NOTE — Consult Note (Signed)
ANTICOAGULATION CONSULT NOTE - Follow up [facility] for Warfarin Indication: H/o chronic DVT  Allergies  Allergen Reactions  . Sulfa Antibiotics Hives    Patient Measurements: Height: '5\' 6"'$  (167.6 cm) Weight: 207 lb 4.8 oz (94.031 kg) IBW/kg (Calculated) : 59.3  Vital Signs: Temp: 97.8 F (36.6 C) (03/01 0556) Temp Source: Oral (03/01 0556) BP: 100/51 mmHg (03/01 0556) Pulse Rate: 71 (03/01 0556)  Labs:  Recent Labs  03/05/15 0100 03/06/15 0552 03/07/15 0614  HGB 11.6* 9.4*  --   HCT 34.2* 28.2*  --   PLT 202 117*  --   LABPROT  --  19.0* 22.7*  INR  --  1.59 2.02  CREATININE 1.03* 0.88  --   TROPONINI <0.03  --   --     Estimated Creatinine Clearance: 80.5 mL/min (by C-G formula based on Cr of 0.88).   Assessment: 60 yo female admitted after recent hospital discharge with SOB. Pharmacy consulted to manage warfarin in this patient with a history of chronic DVT. Home dose noted to be warfarin '8mg'$  nightly PTA.   Pt also on levofloxacin  2/27 no INR - warfarin 8 mg 2/28  INR 1.59 - warfarin 8 mg 3/1  INR 2.02   Goal of Therapy:  INR 2-3 Monitor platelets by anticoagulation protocol: Yes   Plan:  INR therapeutic today with significant increase in INR. (Previous admission, INR uptrended on warfarin 8 mg, even despite potentially missing one dose.)  On levofloxacin as well. Will decrease to warfarin 7 mg PO x1 for today as INR borderline low end of therapeutic range. May need further decreases in dose depending on INR results.  Follow up on AM labs.  Rocky Morel 03/07/2015,8:09 AM

## 2015-03-07 NOTE — Progress Notes (Signed)
SATURATION QUALIFICATIONS: (This note is used to comply with regulatory documentation for home oxygen)  Patient Saturations on Room Air at Rest = 92%  Patient Saturations on Room Air while Ambulating =92%  Patient Saturations on1 Liters of oxygen while Ambulating =92%  Please briefly explain why patient needs home oxygen:

## 2015-03-08 ENCOUNTER — Ambulatory Visit: Payer: Commercial Managed Care - HMO | Admitting: Pulmonary Disease

## 2015-03-08 ENCOUNTER — Encounter: Payer: Self-pay | Admitting: *Deleted

## 2015-03-09 ENCOUNTER — Telehealth: Payer: Self-pay

## 2015-03-09 MED ORDER — NYSTATIN 100000 UNIT/ML MT SUSP: 5.0000 mL | Freq: Four times a day (QID) | OROMUCOSAL | Status: AC

## 2015-03-09 NOTE — Telephone Encounter (Signed)
Patient states that she has "thrush" on her tongue and that everything she puts in her mouth burns. Patient is requesting a medication that she can use as a mouth rinse.  Patient's pharmacy is [facility] [phone] [id number].

## 2015-03-10 LAB — CULTURE, BLOOD (ROUTINE X 2)
CULTURE: NO GROWTH
CULTURE: NO GROWTH

## 2015-03-11 ENCOUNTER — Emergency Department: Payer: Commercial Managed Care - HMO

## 2015-03-11 ENCOUNTER — Encounter: Payer: Self-pay | Admitting: Emergency Medicine

## 2015-03-11 ENCOUNTER — Emergency Department
Admission: EM | Admit: 2015-03-11 | Discharge: 2015-03-11 | Disposition: A | Discharge status: Home / Self Care | Payer: Commercial Managed Care - HMO | Attending: Emergency Medicine | Admitting: Emergency Medicine

## 2015-03-11 DIAGNOSIS — Z7951 Long term (current) use of inhaled steroids: Secondary | ICD-10-CM | POA: Insufficient documentation

## 2015-03-11 DIAGNOSIS — R600 Localized edema: Secondary | ICD-10-CM | POA: Insufficient documentation

## 2015-03-11 DIAGNOSIS — I10 Essential (primary) hypertension: Secondary | ICD-10-CM | POA: Diagnosis not present

## 2015-03-11 DIAGNOSIS — F17210 Nicotine dependence, cigarettes, uncomplicated: Secondary | ICD-10-CM | POA: Insufficient documentation

## 2015-03-11 DIAGNOSIS — Z79899 Other long term (current) drug therapy: Secondary | ICD-10-CM | POA: Insufficient documentation

## 2015-03-11 DIAGNOSIS — R609 Edema, unspecified: Secondary | ICD-10-CM

## 2015-03-11 DIAGNOSIS — R2243 Localized swelling, mass and lump, lower limb, bilateral: Secondary | ICD-10-CM | POA: Diagnosis present

## 2015-03-11 DIAGNOSIS — Z792 Long term (current) use of antibiotics: Secondary | ICD-10-CM | POA: Diagnosis not present

## 2015-03-11 LAB — BASIC METABOLIC PANEL
ANION GAP: 6 (ref 5–15)
BUN: 15 mg/dL (ref 6–20)
CALCIUM: 8.2 mg/dL — AB (ref 8.9–10.3)
CO2: 28 mmol/L (ref 22–32)
CREATININE: 1.07 mg/dL — AB (ref 0.44–1.00)
Chloride: 108 mmol/L (ref 101–111)
GFR calc Af Amer: >60 mL/min (ref >60)
GFR, EST NON AFRICAN AMERICAN: 56 mL/min — AB (ref >60)
GLUCOSE: 111 mg/dL — AB (ref 65–99)
Potassium: 4.4 mmol/L (ref 3.5–5.1)
Sodium: 142 mmol/L (ref 135–145)

## 2015-03-11 LAB — CBC WITH DIFFERENTIAL/PLATELET
BLASTS: 0 %
Band Neutrophils: 1 %
Basophils Absolute: 0 10*3/uL (ref 0–0.1)
Basophils Relative: 0 %
EOS PCT: 0 %
Eosinophils Absolute: 0 10*3/uL (ref 0–0.7)
HEMATOCRIT: 30.5 % — AB (ref 35.0–47.0)
Hemoglobin: 10.2 g/dL — ABNORMAL LOW (ref 12.0–16.0)
LYMPHS ABS: 0.8 10*3/uL — AB (ref 1.0–3.6)
LYMPHS PCT: 10 %
MCH: 31.7 pg (ref 26.0–34.0)
MCHC: 33.4 g/dL (ref 32.0–36.0)
MCV: 94.8 fL (ref 80.0–100.0)
MONOS PCT: 2 %
Metamyelocytes Relative: 0 %
Monocytes Absolute: 0.2 10*3/uL (ref 0.2–0.9)
Myelocytes: 1 %
NEUTROS ABS: 7 10*3/uL — AB (ref 1.4–6.5)
NRBC: 0 /100{WBCs}
Neutrophils Relative %: 86 %
OTHER: 0 %
PLATELETS: 125 10*3/uL — AB (ref 150–440)
Promyelocytes Absolute: 0 %
RBC: 3.21 MIL/uL — AB (ref 3.80–5.20)
RDW: 16.7 % — AB (ref 11.5–14.5)
WBC: 8 10*3/uL (ref 3.6–11.0)

## 2015-03-11 MED ORDER — FUROSEMIDE 20 MG PO TABS: 20.0000 mg | ORAL_TABLET | Freq: Every day | ORAL | Status: DC

## 2015-03-11 NOTE — Discharge Instructions (Signed)
Please seek medical attention for any high fevers, chest pain, shortness of breath, change in behavior, persistent vomiting, bloody stool or any other new or concerning symptoms. ° ° °Peripheral Edema °You have swelling in your legs (peripheral edema). This swelling is due to excess accumulation of salt and water in your body. Edema may be a sign of heart, kidney or liver disease, or a side effect of a medication. It may also be due to problems in the leg veins. Elevating your legs and using special support stockings may be very helpful, if the cause of the swelling is due to poor venous circulation. Avoid long periods of standing, whatever the cause. °Treatment of edema depends on identifying the cause. Chips, pretzels, pickles and other salty foods should be avoided. Restricting salt in your diet is almost always needed. Water pills (diuretics) are often used to remove the excess salt and water from your body via urine. These medicines prevent the kidney from reabsorbing sodium. This increases urine flow. °Diuretic treatment may also result in lowering of potassium levels in your body. Potassium supplements may be needed if you have to use diuretics daily. Daily weights can help you keep track of your progress in clearing your edema. You should call your caregiver for follow up care as recommended. °SEEK IMMEDIATE MEDICAL CARE IF:  °· You have increased swelling, pain, redness, or heat in your legs. °· You develop shortness of breath, especially when lying down. °· You develop chest or abdominal pain, weakness, or fainting. °· You have a fever. °  °This information is not intended to replace advice given to you by your health care provider. Make sure you discuss any questions you have with your health care provider. °  °Document Released: 01/31/2004 Document Revised: 03/17/2011 Document Reviewed: 07/05/2014 °Elsevier Interactive Patient Education ©2016 [employer] ° °

## 2015-03-11 NOTE — ED Notes (Signed)
Pt to ED via EMS c/o BLE pitting edema +3/+4. Pt discharged from [facility] on the 2nd following admission for PNA and states leg swelling started the day she was discharged. Pt reports trying to elevate legs without relief. Reports 8/10 pain from foot to knee on both sides.

## 2015-03-11 NOTE — ED Provider Notes (Signed)
[facility] Emergency Department Provider Note   ____________________________________________  Time seen: ~2000  I have reviewed the triage vital signs and the nursing notes.   HISTORY  Chief Complaint Leg Swelling   History limited by: Not Limited   HPI [name] is a 60 y.o. female with history of DVTs and recent hospitalization for pneumonia who presents to the emergency department today because of concerns for bilateral lower extremity swelling. The patient states that the swelling started after discharge from the hospital. It is gradually gotten worse. She states it is worse in the morning. It has been accompanied by some pain and some difficulty with ambulation. The patient states that she does have a history of DVTs in the past however is not currently on any anticoagulation. No associated fevers, significant chest pain or shortness breath.    Past Medical History  Diagnosis Date  . Schizophrenia ([place])   . Asthma   . GERD (gastroesophageal reflux disease)   . Anxiety   . Depression   . Bipolar disorder ([place])   . COPD (chronic obstructive pulmonary disease) ([place])   . Occasional tremors     right hand  . PTSD (post-traumatic stress disorder)   . Shortness of breath dyspnea   . Fibromyalgia   . DVT (deep venous thrombosis) ([place]) 2011    RUE  . Thyroid nodule   . DDD (degenerative disc disease), lumbar   . Spinal stenosis   . Peripheral neuropathy ([place])   . Rotator cuff tear     right  . Pneumonia 2011  . Hypothyroidism     no meds currently  . Anemia     during pregnancy only  . Hypertension     Off meds x 15 years-well controlled now per pt  . Squamous cell cancer, anus (HCC)   . Severe obstructive sleep apnea 06/27/2014  . DVT of upper extremity (deep vein thrombosis) ([place]) 10/14/2014    Patient Active Problem List   Diagnosis Date Noted  . HCAP (healthcare-associated pneumonia) 03/05/2015  . 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 GI bleed 03/02/2015  . SBO (small bowel obstruction) ([place])   . Anemia   . COPD exacerbation ([place]) 02/27/2015  . Aspiration pneumonitis ([place]) 02/27/2015  . Small bowel obstruction ([place]) 02/27/2015  . Hyperglycemia 02/27/2015  . Chronic pain syndrome 02/27/2015  . COPD (chronic obstructive pulmonary disease) ([place]) 02/26/2015  . COPD, very severe ([place]) 12/12/2014  . Fibromyalgia 12/12/2014  . DVT of upper extremity (deep vein thrombosis) ([place]) 10/14/2014  . B12 deficiency 09/28/2014  . Folate deficiency 09/28/2014  . Macrocytosis 09/21/2014  . Depression   . Hypokalemia 07/20/2014  . Anal cancer ([place]) 06/27/2014  . Anal fissure 06/27/2014  . Anxiety 06/27/2014  . Airway hyperreactivity 06/27/2014  . H/O manic depressive disorder 06/27/2014  . CAFL (chronic airflow limitation) ([place]) 06/27/2014  . Clinical depression 06/27/2014  . Deep vein thrombosis ([place]) 06/27/2014  . External hemorrhoid 06/27/2014  . Myalgia and myositis 06/27/2014  . Acid reflux 06/27/2014  . Hemorrhoid 06/27/2014  . BP (high blood pressure) 06/27/2014  . HLD (hyperlipidemia) 06/27/2014  . Adult hypothyroidism 06/27/2014  . LBP (low back pain) 06/27/2014  . Mass of perianal area 06/27/2014  . External hemorrhoid, thrombosed 06/27/2014  . 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 Ulcerated hemorrhoid 06/27/2014  . Severe obstructive sleep apnea 06/27/2014  . Squamous cell cancer, anus (HCC)   . Rectal ulcer 05/17/2014  Past Surgical History  Procedure Laterality Date  . Foot surgery Right   . Tubal ligation    . Eye surgery Bilateral   . Mouth surgery  2002  . Rectal biopsy N/A 05/08/2014    Procedure: BIOPSY RECTAL;  Surgeon: Marlyce Huge, MD;  Location: [facility] ORS;  Service: General;  Laterality: N/A;  . Evaluation under anesthesia with hemorrhoidectomy N/A 05/08/2014    Procedure: EXAM UNDER ANESTHESIA WITH HEMORRHOIDECTOMY;  Surgeon: Marlyce Huge, MD;  Location: [facility] ORS;  Service: General;  Laterality: N/A;  . Portacath placement N/A 05/20/2014    Procedure: INSERTION PORT-A-CATH;  Surgeon: Florene Glen, MD;  Location: [facility] ORS;  Service: General;  Laterality: N/A;  . Laparoscopic diverted colostomy N/A 05/26/2014    Procedure: LAPAROSCOPIC DIVERTED COLOSTOMY;  Surgeon: Marlyce Huge, MD;  Location: [facility] ORS;  Service: General;  Laterality: N/A;  . Peripheral vascular catheterization Left 10/16/2014    Procedure: Upper Extremity Venography with thrombectomy, port removal;  Surgeon: Algernon Huxley, MD;  Location: [place] [facility];  Service: Cardiovascular;  Laterality: Left;  . Peripheral vascular catheterization  10/16/2014    Procedure: Upper Extremity Intervention;  Surgeon: Algernon Huxley, MD;  Location: [place] [facility];  Service: Cardiovascular;;    Current Outpatient Rx  Name  Route  Sig  Dispense  Refill  . albuterol (PROVENTIL HFA;VENTOLIN HFA) 108 (90 BASE) MCG/ACT inhaler   Inhalation   Inhale 2 puffs into the lungs every 6 (six) hours as needed for wheezing or shortness of breath.          Marland Kitchen albuterol (PROVENTIL) (2.5 MG/3ML) 0.083% nebulizer solution   Nebulization   Take 3 mLs (2.5 mg total) by nebulization every 4 (four) hours.   360 vial   12   . ARIPiprazole (ABILIFY) 2 MG tablet   Oral   Take 1 tablet (2 mg total) by mouth daily.   30 tablet   4   . baclofen (LIORESAL) 10 MG tablet   Oral   Take 1 tablet (10 mg total) by mouth 3 (three) times daily. Patient not taking: Reported on 02/28/2015   90 each   6   . bisacodyl (DULCOLAX) 5 MG EC tablet   Oral   Take 1 tablet (5 mg total) by mouth daily as needed for moderate constipation.   30 tablet   0   . budesonide-formoterol (SYMBICORT) 160-4.5 MCG/ACT inhaler   Inhalation   Inhale 2 puffs into the lungs 2 (two) times daily.   1 Inhaler   12   . Calcium Carb-Cholecalciferol (CALCIUM 600+D) 600-800 MG-UNIT TABS   Oral    Take 1 tablet by mouth 2 (two) times daily.          . clonazePAM (KLONOPIN) 1 MG tablet   Oral   Take 1 tablet (1 mg total) by mouth 3 (three) times daily as needed for anxiety.   90 tablet   4   . docusate sodium (COLACE) 100 MG capsule   Oral   Take 100 mg by mouth daily.         Marland Kitchen doxycycline (VIBRA-TABS) 100 MG tablet   Oral   Take 1 tablet (100 mg total) by mouth every 12 (twelve) hours.   18 tablet   0   . DULoxetine (CYMBALTA) 60 MG capsule   Oral   Take 60 mg by mouth at bedtime.         Marland Kitchen escitalopram (LEXAPRO) 20 MG tablet   Oral  Take 1 tablet (20 mg total) by mouth daily.   30 tablet   4     HOLD UNTIL PATIENT CALLS FOR REFILL   . ipratropium-albuterol (DUONEB) 0.5-2.5 (3) MG/3ML SOLN   Nebulization   Take 3 mLs by nebulization every 4 (four) hours as needed.   360 mL   6   . levofloxacin (LEVAQUIN) 750 MG tablet   Oral   Take 1 tablet (750 mg total) by mouth daily.   7 tablet   0   . nicotine (NICODERM CQ - DOSED IN MG/24 HOURS) 21 mg/24hr patch   Transdermal   Place 1 patch (21 mg total) onto the skin daily.   28 patch   0   . nicotine polacrilex (NICORETTE) 2 MG gum   Oral   Take 1 each (2 mg total) by mouth as needed for smoking cessation.   100 tablet   0   . nystatin (MYCOSTATIN) 100000 UNIT/ML suspension   Oral   Take 5 mLs (500,000 Units total) by mouth 4 (four) times daily.   60 mL   2   . ondansetron (ZOFRAN) 4 MG tablet   Oral   Take 1 tablet (4 mg total) by mouth every 8 (eight) hours as needed for nausea or vomiting.   20 tablet   0   . oxyCODONE-acetaminophen (PERCOCET) 10-325 MG tablet   Oral   Take 1 tablet by mouth every 6 (six) hours as needed for pain.   30 tablet   0   . pantoprazole (PROTONIX) 40 MG tablet   Oral   Take 40 mg by mouth 2 (two) times daily.          . polyethylene glycol (MIRALAX / GLYCOLAX) packet   Oral   Take 17 g by mouth daily.   14 each   0   . predniSONE (STERAPRED  UNI-PAK 21 TAB) 10 MG (21) TBPK tablet   Oral   Take 1 tablet (10 mg total) by mouth daily. Take 6 tablets by mouth for 1 day followed by  5 tablets by mouth for 1 day followed by  4 tablets by mouth for 1 day followed by  3 tablets by mouth for 1 day followed by  2 tablets by mouth for 1 day followed by  1 tablet by mouth for a day and stop   21 tablet   0   . pregabalin (LYRICA) 200 MG capsule   Oral   Take 1 capsule (200 mg total) by mouth 2 (two) times daily.   90 capsule   6   . senna-docusate (SENOKOT-S) 8.6-50 MG tablet   Oral   Take 1 tablet by mouth at bedtime as needed for mild constipation.   30 tablet   5   . tiotropium (SPIRIVA) 18 MCG inhalation capsule   Inhalation   Place 1 capsule (18 mcg total) into inhaler and inhale daily.   30 capsule   12   . zolpidem (AMBIEN) 10 MG tablet   Oral   Take 1 tablet (10 mg total) by mouth at bedtime as needed for sleep.   30 tablet   4     Allergies Sulfa antibiotics  Family History  Problem Relation Age of Onset  . Cancer Maternal Aunt   . Cancer Paternal Uncle   . Diabetes Mother   . Thyroid disease Mother   . Kidney failure Mother   . Hypertension Mother   . Depression Mother   . Mental illness  Son   . Aneurysm Maternal Grandmother   . Thyroid disease Maternal Grandmother   . Stroke Maternal Grandfather   . Hypertension Maternal Grandfather   . Diabetes Maternal Grandfather   . Heart disease Maternal Grandfather     MI  . Nephrolithiasis Daughter     Social History Social History  Substance Use Topics  . Smoking status: Current Some Day Smoker -- 0.10 packs/day for 44 years    Types: Cigarettes    Start date: 10/04/1970  . Smokeless tobacco: Never Used  . Alcohol Use: No     Comment: 1 drink every 2-3 times/year    Review of Systems  Constitutional: Negative for fever. Cardiovascular: Negative for chest pain. Respiratory: Negative for shortness of breath. Gastrointestinal: Negative for  abdominal pain, vomiting and diarrhea. Neurological: Negative for headaches, focal weakness or numbness.  10-point ROS otherwise negative.  ____________________________________________   PHYSICAL EXAM:  VITAL SIGNS: ED Triage Vitals  Enc Vitals Group     BP 03/11/15 1750 122/64 mmHg     Pulse Rate 03/11/15 1750 70     Resp 03/11/15 1750 18     Temp 03/11/15 1750 97.9 F (36.6 C)     Temp Source 03/11/15 1750 Oral     SpO2 03/11/15 1750 100 %     Weight 03/11/15 1750 232 lb 2.3 oz (105.3 kg)     Height 03/11/15 1750 5' 6.5" (1.689 m)     Head Cir --      Peak Flow --      Pain Score 03/11/15 1752 8   Constitutional: Alert and oriented. Well appearing and in no distress. Eyes: Conjunctivae are normal. PERRL. Normal extraocular movements. ENT   Head: Normocephalic and atraumatic.   Nose: No congestion/rhinnorhea.   Mouth/Throat: Mucous membranes are moist.   Neck: No stridor. Hematological/Lymphatic/Immunilogical: No cervical lymphadenopathy. Cardiovascular: Normal rate, regular rhythm.  No murmurs, rubs, or gallops. Respiratory: Normal respiratory effort without tachypnea nor retractions. Breath sounds are clear and equal bilaterally. No wheezes/rales/rhonchi. Gastrointestinal: Soft and nontender. No distention. Genitourinary: Deferred Musculoskeletal: Normal range of motion in all extremities. No joint effusions.  Bilateral lower extremity 2+ edema. Neurologic:  Normal speech and language. No gross focal neurologic deficits are appreciated.  Skin:  Skin is warm, dry and intact. No rash noted. Psychiatric: Mood and affect are normal. Speech and behavior are normal. Patient exhibits appropriate insight and judgment.  ____________________________________________    LABS (pertinent positives/negatives)  Labs Reviewed  CBC WITH DIFFERENTIAL/PLATELET - Abnormal; Notable for the following:    RBC 3.21 (*)    Hemoglobin 10.2 (*)    HCT 30.5 (*)    RDW 16.7 (*)     Platelets 125 (*)    Neutro Abs 7.0 (*)    Lymphs Abs 0.8 (*)    All other components within normal limits  BASIC METABOLIC PANEL - Abnormal; Notable for the following:    Glucose, Bld 111 (*)    Creatinine, Ser 1.07 (*)    Calcium 8.2 (*)    GFR calc non Af Amer 56 (*)    All other components within normal limits     ____________________________________________   EKG  None  ____________________________________________    RADIOLOGY  US venous lower extremities - pending  ____________________________________________   PROCEDURES  Procedure(s) performed: None  Critical Care performed: No  ____________________________________________   INITIAL IMPRESSION / ASSESSMENT AND PLAN / ED COURSE  Pertinent labs & imaging results that were available during my care of  the patient were reviewed by me and considered in my medical decision making (see chart for details).  Patient presented to the emergency department today because of concerns for bilateral lower extremity swelling. Patient did have a recent hospitalization during which time she received IV fluids. She does however have a history of DVTs and is no longer on anticoagulation. Because of this will obtain ultrasound of the lower extremities.  ____________________________________________   FINAL CLINICAL IMPRESSION(S) / ED DIAGNOSES  Final diagnoses:  Peripheral edema     Nance Pear, MD 03/13/15 1515

## 2015-03-12 ENCOUNTER — Telehealth: Payer: Self-pay

## 2015-03-12 ENCOUNTER — Inpatient Hospital Stay: Payer: Commercial Managed Care - HMO

## 2015-03-12 ENCOUNTER — Inpatient Hospital Stay: Payer: Commercial Managed Care - HMO | Attending: Hematology and Oncology | Admitting: Hematology and Oncology

## 2015-03-12 ENCOUNTER — Other Ambulatory Visit: Payer: Self-pay | Admitting: Hematology and Oncology

## 2015-03-12 ENCOUNTER — Ambulatory Visit: Payer: Commercial Managed Care - HMO | Admitting: Pulmonary Disease

## 2015-03-12 ENCOUNTER — Other Ambulatory Visit: Payer: Self-pay | Admitting: *Deleted

## 2015-03-12 VITALS — BP 145/86 | HR 89 | Temp 98.1°F | Resp 18 | Ht 66.5 in | Wt 224.0 lb

## 2015-03-12 DIAGNOSIS — G4733 Obstructive sleep apnea (adult) (pediatric): Secondary | ICD-10-CM

## 2015-03-12 DIAGNOSIS — Z79899 Other long term (current) drug therapy: Secondary | ICD-10-CM

## 2015-03-12 DIAGNOSIS — Z86718 Personal history of other venous thrombosis and embolism: Secondary | ICD-10-CM | POA: Insufficient documentation

## 2015-03-12 DIAGNOSIS — E538 Deficiency of other specified B group vitamins: Secondary | ICD-10-CM

## 2015-03-12 DIAGNOSIS — I10 Essential (primary) hypertension: Secondary | ICD-10-CM

## 2015-03-12 DIAGNOSIS — G894 Chronic pain syndrome: Secondary | ICD-10-CM

## 2015-03-12 DIAGNOSIS — R7989 Other specified abnormal findings of blood chemistry: Secondary | ICD-10-CM

## 2015-03-12 DIAGNOSIS — C210 Malignant neoplasm of anus, unspecified: Secondary | ICD-10-CM | POA: Diagnosis not present

## 2015-03-12 DIAGNOSIS — J449 Chronic obstructive pulmonary disease, unspecified: Secondary | ICD-10-CM

## 2015-03-12 DIAGNOSIS — E039 Hypothyroidism, unspecified: Secondary | ICD-10-CM

## 2015-03-12 DIAGNOSIS — R109 Unspecified abdominal pain: Secondary | ICD-10-CM | POA: Insufficient documentation

## 2015-03-12 DIAGNOSIS — K219 Gastro-esophageal reflux disease without esophagitis: Secondary | ICD-10-CM | POA: Insufficient documentation

## 2015-03-12 DIAGNOSIS — Z8701 Personal history of pneumonia (recurrent): Secondary | ICD-10-CM

## 2015-03-12 DIAGNOSIS — F419 Anxiety disorder, unspecified: Secondary | ICD-10-CM

## 2015-03-12 DIAGNOSIS — G629 Polyneuropathy, unspecified: Secondary | ICD-10-CM | POA: Insufficient documentation

## 2015-03-12 DIAGNOSIS — F17210 Nicotine dependence, cigarettes, uncomplicated: Secondary | ICD-10-CM | POA: Insufficient documentation

## 2015-03-12 DIAGNOSIS — J45909 Unspecified asthma, uncomplicated: Secondary | ICD-10-CM | POA: Insufficient documentation

## 2015-03-12 DIAGNOSIS — F319 Bipolar disorder, unspecified: Secondary | ICD-10-CM

## 2015-03-12 DIAGNOSIS — F329 Major depressive disorder, single episode, unspecified: Secondary | ICD-10-CM

## 2015-03-12 DIAGNOSIS — R600 Localized edema: Secondary | ICD-10-CM

## 2015-03-12 DIAGNOSIS — F209 Schizophrenia, unspecified: Secondary | ICD-10-CM

## 2015-03-12 DIAGNOSIS — M797 Fibromyalgia: Secondary | ICD-10-CM

## 2015-03-12 DIAGNOSIS — R1084 Generalized abdominal pain: Secondary | ICD-10-CM

## 2015-03-12 DIAGNOSIS — F4310 Post-traumatic stress disorder, unspecified: Secondary | ICD-10-CM

## 2015-03-12 MED ORDER — OXYCODONE-ACETAMINOPHEN 10-325 MG PO TABS: 1.0000 | ORAL_TABLET | Freq: Four times a day (QID) | ORAL | Status: DC | PRN

## 2015-03-12 MED ORDER — FOLIC ACID 1 MG PO TABS
1.0000 mg | ORAL_TABLET | Freq: Every day | ORAL | Status: AC
Start: 2015-03-12 — End: ?

## 2015-03-12 MED ORDER — CYANOCOBALAMIN 1000 MCG/ML IJ SOLN
1000.0000 ug | Freq: Once | INTRAMUSCULAR | Status: AC
  Administered 2015-03-12: 1000 ug via INTRAMUSCULAR
  Filled 2015-03-12: qty 1

## 2015-03-12 NOTE — Telephone Encounter (Signed)
Called and left patient a voicemail asking if she could please return my call.

## 2015-03-12 NOTE — Telephone Encounter (Signed)
I can't do this iwthout a face to face encounter

## 2015-03-12 NOTE — Progress Notes (Signed)
[name] day:  03/12/2015  Chief Complaint: [name] is a 60 y.o. 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  HPI:  The patient was last seen in the medical oncology [name] on 01/23/2015.  At that time, she noted some interval abdominal pain and blood in her ostomy.  She denied any bruising or other bleeding.  She had chronic rectal pain.  She had not gotten an appointment with the pain [name].  She was referred to GI.  Amylase and lipase were normal.  INR was 1.12.  Coumadin was adjusted.  She was to continue her monthly E36 and daily folic acid.   She was admitted to [facility] from 02/26/2015 - 03/03/2015 with a COPD exacerbation, RLL pneumonia, and small bowel obstruction.  She was treated with antibiotics and steroids.  Her small bowel obstruction resolved with conservative management.  Surgery was considering elective ostomy reversal or correction of parastomal hernia in future.    Left upper extremity duplex on 03/02/2015 revealed resolved thrombus in the right internal jugular, subclavian and axillary veins.  There was a small amount of residual thrombus in the proximal right brachial vein over a short segment (very low thrombus burden).  There was residual superficial thrombosis of the distal cephalic vein across the antecubital fossa (very low residual thrombus burden).  She was admitted to [facility] from 03/05/2015 - 03/07/2015 with health care associated pneumonia.  She was treated with Zosyn and Levaquin and discharged on Levaquin and prednisone.  She was discharged on oxygen 2 liters/min vi nasal cannula to maintain sats > 90%.  Her Coumadin was discontinued by me after consultation with Dr. Lucky Cowboy.  She states that she has follow-up with  Dr. Burt Knack, surgeon, on 03/15/2015 to discuss her colostomy. She has a follow-up with Dr. Lucky Cowboy on 03/27/2015.    She  notes ongoing abdominal discomfort.  She has ongoing leakage from her rectum.  She states that GI tried to do an endoscopy.  She was seen in the emergency room last evening for lower extremity edema.  Bilateral lower extremity duplex on 03/11/2015 revealed no evidence of DVT.  She was started on Lasix.  Past Medical History  Diagnosis Date  . Schizophrenia ([place])   . Asthma   . GERD (gastroesophageal reflux disease)   . Anxiety   . Depression   . Bipolar disorder ([place])   . COPD (chronic obstructive pulmonary disease) ([place])   . Occasional tremors     right hand  . PTSD (post-traumatic stress disorder)   . Shortness of breath dyspnea   . Fibromyalgia   . DVT (deep venous thrombosis) ([place]) 2011    RUE  . Thyroid nodule   . DDD (degenerative disc disease), lumbar   . Spinal stenosis   . Peripheral neuropathy ([place])   . Rotator cuff tear     right  . Pneumonia 2011  . Hypothyroidism     no meds currently  . Anemia     during pregnancy only  . Hypertension     Off meds x 15 years-well controlled now per pt  . Squamous cell cancer, anus (H[facility])   . Severe obstructive sleep apnea 06/27/2014  . DVT of upper extremity (deep vein thrombosis) ([place]) 10/14/2014    Past Surgical History  Procedure Laterality Date  . Foot surgery Right   . Tubal ligation    .  Eye surgery Bilateral   . Mouth surgery  2002  . Rectal biopsy N/A 05/08/2014    Procedure: BIOPSY RECTAL;  Surgeon: Marlyce Huge, MD;  Location: [facility] ORS;  Service: General;  Laterality: N/A;  . Evaluation under anesthesia with hemorrhoidectomy N/A 05/08/2014    Procedure: EXAM UNDER ANESTHESIA WITH HEMORRHOIDECTOMY;  Surgeon: Marlyce Huge, MD;  Location: [facility] ORS;  Service: General;  Laterality: N/A;  . Portacath placement N/A 05/20/2014    Procedure: INSERTION PORT-A-CATH;  Surgeon: Florene Glen, MD;  Location: [facility] ORS;  Service: General;  Laterality: N/A;  . Laparoscopic diverted colostomy N/A 05/26/2014     Procedure: LAPAROSCOPIC DIVERTED COLOSTOMY;  Surgeon: Marlyce Huge, MD;  Location: [facility] ORS;  Service: General;  Laterality: N/A;  . Peripheral vascular catheterization Left 10/16/2014    Procedure: Upper Extremity Venography with thrombectomy, port removal;  Surgeon: Algernon Huxley, MD;  Location: [facility];  Service: Cardiovascular;  Laterality: Left;  . Peripheral vascular catheterization  10/16/2014    Procedure: Upper Extremity Intervention;  Surgeon: Algernon Huxley, MD;  Location: [facility];  Service: Cardiovascular;;    Family History  Problem Relation Age of Onset  . Cancer Maternal Aunt   . Cancer Paternal Uncle   . Diabetes Mother   . Thyroid disease Mother   . Kidney failure Mother   . Hypertension Mother   . Depression Mother   . Mental illness Son   . Aneurysm Maternal Grandmother   . Thyroid disease Maternal Grandmother   . Stroke Maternal Grandfather   . Hypertension Maternal Grandfather   . Diabetes Maternal Grandfather   . Heart disease Maternal Grandfather     MI  . Nephrolithiasis Daughter     Social History:  reports that she has been smoking Cigarettes.  She started smoking about 44 years ago. She has a 4.4 pack-year smoking history. She has never used smokeless tobacco. She reports that she uses illicit drugs (Marijuana). She reports that she does not drink alcohol.  The patient is alone today.  Allergies:  Allergies  Allergen Reactions  . Sulfa Antibiotics Hives    Current Medications: Current Outpatient Prescriptions  Medication Sig Dispense Refill  . albuterol (PROVENTIL HFA;VENTOLIN HFA) 108 (90 BASE) MCG/ACT inhaler Inhale 2 puffs into the lungs every 6 (six) hours as needed for wheezing or shortness of breath.     Marland Kitchen albuterol (PROVENTIL) (2.5 MG/3ML) 0.083% nebulizer solution Take 3 mLs (2.5 mg total) by nebulization every 4 (four) hours. 360 vial 12  . ARIPiprazole (ABILIFY) 2 MG tablet Take 1 tablet (2 mg total) by mouth  daily. 30 tablet 4  . baclofen (LIORESAL) 10 MG tablet Take 1 tablet (10 mg total) by mouth 3 (three) times daily. 90 each 6  . bisacodyl (DULCOLAX) 5 MG EC tablet Take 1 tablet (5 mg total) by mouth daily as needed for moderate constipation. 30 tablet 0  . budesonide-formoterol (SYMBICORT) 160-4.5 MCG/ACT inhaler Inhale 2 puffs into the lungs 2 (two) times daily. 1 Inhaler 12  . Calcium Carb-Cholecalciferol (CALCIUM 600+D) 600-800 MG-UNIT TABS Take 1 tablet by mouth 2 (two) times daily.     . clonazePAM (KLONOPIN) 1 MG tablet Take 1 tablet (1 mg total) by mouth 3 (three) times daily as needed for anxiety. 90 tablet 4  . docusate sodium (COLACE) 100 MG capsule Take 100 mg by mouth daily.    . DULoxetine (CYMBALTA) 60 MG capsule Take 60 mg by mouth at bedtime.    Marland Kitchen  escitalopram (LEXAPRO) 20 MG tablet Take 1 tablet (20 mg total) by mouth daily. 30 tablet 4  . furosemide (LASIX) 20 MG tablet Take 1 tablet (20 mg total) by mouth daily. 14 tablet 11  . ipratropium-albuterol (DUONEB) 0.5-2.5 (3) MG/3ML SOLN Take 3 mLs by nebulization every 4 (four) hours as needed. 360 mL 6  . levofloxacin (LEVAQUIN) 750 MG tablet Take 1 tablet (750 mg total) by mouth daily. 7 tablet 0  . nicotine (NICODERM CQ - DOSED IN MG/24 HOURS) 21 mg/24hr patch Place 1 patch (21 mg total) onto the skin daily. 28 patch 0  . nystatin (MYCOSTATIN) 100000 UNIT/ML suspension Take 5 mLs (500,000 Units total) by mouth 4 (four) times daily. 60 mL 2  . oxyCODONE-acetaminophen (PERCOCET) 10-325 MG tablet Take 1 tablet by mouth every 6 (six) hours as needed for pain. 30 tablet 0  . pantoprazole (PROTONIX) 40 MG tablet Take 40 mg by mouth 2 (two) times daily.     . polyethylene glycol (MIRALAX / GLYCOLAX) packet Take 17 g by mouth daily. 14 each 0  . predniSONE (STERAPRED UNI-PAK 21 TAB) 10 MG (21) TBPK tablet Take 1 tablet (10 mg total) by mouth daily. Take 6 tablets by mouth for 1 day followed by  5 tablets by mouth for 1 day followed by  4  tablets by mouth for 1 day followed by  3 tablets by mouth for 1 day followed by  2 tablets by mouth for 1 day followed by  1 tablet by mouth for a day and stop 21 tablet 0  . pregabalin (LYRICA) 200 MG capsule Take 1 capsule (200 mg total) by mouth 2 (two) times daily. 90 capsule 6  . senna-docusate (SENOKOT-S) 8.6-50 MG tablet Take 1 tablet by mouth at bedtime as needed for mild constipation. 30 tablet 5  . tiotropium (SPIRIVA) 18 MCG inhalation capsule Place 1 capsule (18 mcg total) into inhaler and inhale daily. 30 capsule 12  . zolpidem (AMBIEN) 10 MG tablet Take 1 tablet (10 mg total) by mouth at bedtime as needed for sleep. 30 tablet 4  . [DISCONTINUED] apixaban (ELIQUIS) 5 MG TABS tablet Take 1 tablet (5 mg total) by mouth 2 (two) times daily. 60 tablet 2   No current facility-administered medications for this visit.   Facility-Administered Medications Ordered in Other Visits  Medication Dose Route Frequency Provider Last Rate Last Dose  . heparin lock flush 100 unit/mL  500 Units Intravenous Once Lequita Asal, MD      . sodium chloride 0.9 % injection 10 mL  10 mL Intravenous PRN Lequita Asal, MD        Review of Systems:  GENERAL:  Feels "ok"  No fevers or sweats.  Weight up 20 pounds. PERFORMANCE STATUS (ECOG):  2 HEENT:  No visual changes, runny nose, mouth sores or sore throat. Lungs: No shortness of breath or cough.  No hemoptysis. Cardiac:  No chest pain, palpitations, orthopnea, or PND. GI:  Colostomy.  Blood in ostomy.  Rectal leakage.  Abdominal pain (chronic).  No nausea, vomiting, constipation, or melena. GU:  No urgency, frequency, dysuria, or hematuria. Musculoskeletal:  Back pain with 2 fractures (old).  No joint pain.  No muscle tenderness. Extremities:  Lower extremity swelling. Skin:  Peri-rectal breakdown resolved. Neuro:  No headache, numbness or weakness, balance or coordination issues. Endocrine:  No diabetes, thyroid issues, hot flashes or  night sweats. Psych:  No mood changes or depression.  Pain:  Chronic anal pain.  Using oxycodone prn.  Awaiting pain [name] appointment. Review of systems:  All other systems reviewed and found to be negative.   Physical Exam: Blood pressure 145/86, pulse 89, temperature 98.1 F (36.7 C), temperature source Tympanic, resp. rate 18, height 5' 6.5" (1.689 m), weight 223 lb 15.8 oz (101.6 kg).  GENERAL:  Chronically ill appearing woman sitting comfortably in the exam room in no acute distress.  MENTAL STATUS:  Alert and oriented to person, place and time. HEAD:  Long blonde/graying hair.  Normocephalic, atraumatic, face symmetric, no Cushingoid features. EYES:  Blue eyes.  Pupils equal round and reactive to light and accomodation.  No conjunctivitis or scleral icterus. RESPIRATORY:  Clear to auscultation without rales, wheezes or rhonchi. CARDIOVASCULAR:  Regular rate and rhythm without murmur, rub or gallop. ABDOMEN:  Ostomy.  Stool liquid brown (no obvious blood).  Soft, slightly tender without guarding or rebound tenderness.  Active bowel sounds and no hepatosplenomegaly.  No masses. RECTUM:  Perirectal area with radiation changes.  Pink healthy tissue.  No breakdown. LYMPH NODES:  No palpable cervical, supraclavicular, axillary or inguinal adenopathy. SKIN:  Upper extremity bruises.  No other rashes, ulcers or lesions. EXTREMITIES: Bilateral 2-3+ lower extremity edema.  No skin discoloration or tenderness.  No palpable cords. NEUROLOGICAL: Unremarkable. PSYCH:  Appropriate.   Admission on 03/11/2015, Discharged on 03/11/2015  Component Date Value Ref Range Status  . WBC 03/11/2015 8.0  3.6 - 11.0 K/uL Final  . RBC 03/11/2015 3.21* 3.80 - 5.20 MIL/uL Final  . Hemoglobin 03/11/2015 10.2* 12.0 - 16.0 g/dL Final  . HCT 03/11/2015 30.5* 35.0 - 47.0 % Final  . MCV 03/11/2015 94.8  80.0 - 100.0 fL Final  . MCH 03/11/2015 31.7  26.0 - 34.0 pg Final  . MCHC 03/11/2015 33.4  32.0 - 36.0 g/dL  Final  . RDW 03/11/2015 16.7* 11.5 - 14.5 % Final  . Platelets 03/11/2015 125* 150 - 440 K/uL Final  . Neutrophils Relative % 03/11/2015 86   Final  . Lymphocytes Relative 03/11/2015 10   Final  . Monocytes Relative 03/11/2015 2   Final  . Eosinophils Relative 03/11/2015 0   Final  . Basophils Relative 03/11/2015 0   Final  . Band Neutrophils 03/11/2015 1   Final  . Metamyelocytes Relative 03/11/2015 0   Final  . Myelocytes 03/11/2015 1   Final  . Promyelocytes Absolute 03/11/2015 0   Final  . Blasts 03/11/2015 0   Final  . nRBC 03/11/2015 0  0 /100 WBC Final  . Other 03/11/2015 0   Final  . Neutro Abs 03/11/2015 7.0* 1.4 - 6.5 K/uL Final  . Lymphs Abs 03/11/2015 0.8* 1.0 - 3.6 K/uL Final  . Monocytes Absolute 03/11/2015 0.2  0.2 - 0.9 K/uL Final  . Eosinophils Absolute 03/11/2015 0.0  0 - 0.7 K/uL Final  . Basophils Absolute 03/11/2015 0.0  0 - 0.1 K/uL Final  . Smear Review 03/11/2015 MORPHOLOGY UNREMARKABLE   Final  . Sodium 03/11/2015 142  135 - 145 mmol/L Final  . Potassium 03/11/2015 4.4  3.5 - 5.1 mmol/L Final  . Chloride 03/11/2015 108  101 - 111 mmol/L Final  . CO2 03/11/2015 28  22 - 32 mmol/L Final  . Glucose, Bld 03/11/2015 111* 65 - 99 mg/dL Final  . BUN 03/11/2015 15  6 - 20 mg/dL Final  . Creatinine, Ser 03/11/2015 1.07* 0.44 - 1.00 mg/dL Final  . Calcium 03/11/2015 8.2* 8.9 - 10.3 mg/dL Final  . GFR calc non  Af Amer 03/11/2015 56* >60 mL/min Final  . GFR calc Af Amer 03/11/2015 >60  >60 mL/min Final   Comment: (NOTE) The eGFR has been calculated using the CKD EPI equation. This calculation has not been validated in all clinical situations. eGFR's persistently <60 mL/min signify possible Chronic Kidney Disease.   . Anion gap 03/11/2015 6  5 - 15 Final    Assessment:  [name] is a 60 y.o. female with moderately differentiated squamous cell carcinoma of the anus presenting with a 6 month history of an 80 pound weight and a 2 month history of fecal  incontinence and progressive pain.  Chest, abdomen, and pelvic CT scan on 05/19/2014 revealed no evidence of metastatic disease.  There was a 4 mm RUL pulmonary nodule.  There were borderline (13 mm) enlarged iliac nodes.  There was a 4.7 x 4.1 x 6.0 cm lower rectal and anal mass consistent with anal cancer.  She underwent diverting colostomy on 05/26/2014.    She received concurrent chemotherapy (5FU and mitomycin-C) and radiation (06/29/2014 - 07/31/2014).  She missed 3 days of radiation (07/04-07/06/2014).  She has been off radiation since 08/23/2014 secondary to perirectal breakdown.  She received hyperbaric oxygen.  She was seen by Dr. Phoebe Perch on 01/02/2015.  Closure of her ostomy was not recommended for at least 6 months.    She has macrocytic RBC indices.  Work-up on 09/28/2014 revealed a B12 deficiency (169) and folate deficiency (5.2).  TSH was normal.  She receives B12 (last 12/21/2014).  She is supposed to take folic acid (initially not taking secondary to costs).  She developed a left upper extremity DVT on 10/14/2014.  She underwent thrombolysis, thrombectomy, and port removal on 10/16/2014.  She was on Coumadin.  Left upper extremity duplex on 03/02/2015 revealed resolved thrombus in the right internal jugular, subclavian and axillary veins.  There was a small amount of residual thrombus in the proximal right brachial vein over a short segment (very low thrombus burden).  Coumadin was discontinued during her 03/05/2015 admission.  She was admitted to [facility] from 02/26/2015 - 03/03/2015 with a COPD exacerbation, RLL pneumonia, and small bowel obstruction.  She was treated with antibiotics and steroids.  Her small bowel obstruction resolved with conservative management.    She was admitted to [facility] from 03/05/2015 - 03/07/2015 with health care associated pneumonia.  She was treated with Zosyn and Levaquin and discharged on Levaquin and prednisone.  She was discharged on oxygen 2  liters/min vi nasal cannula to maintain sats > 90%.    She was seen in the emergency room last evening for lower extremity edema.  Bilateral lower extremity duplex on 03/11/2015 revealed no evidence of DVT.  She was started on Lasix.  Symptomatically, she notes chronic abdominal pain and blood in her ostomy.  She has new 2-3+ bilateral lower extremity edema.  Plan: 1.  Review interim admissions, ER visits, and labs. 2.  B12 today and monthly.  Continue folic acid daily. 3.  Abdominal and pelvic CT scan ASAP UE:AVWU cancer, diffuse abominal pain, blood in ostomy, unexplained bilateral lower extremity edema. 4.  Labs prior to scan:  creatinine. 5.  Referral to pain [name]. 6.  RTC in 1 week for  MD assesment.   Lequita Asal, MD  03/12/2015

## 2015-03-12 NOTE — Telephone Encounter (Signed)
Patient called and stated that the hospital told her that she needs a referral for [place] from her PCP.

## 2015-03-13 ENCOUNTER — Other Ambulatory Visit: Payer: Self-pay | Admitting: *Deleted

## 2015-03-13 ENCOUNTER — Telehealth: Payer: Self-pay

## 2015-03-13 NOTE — Telephone Encounter (Signed)
Patient returned call and I was not at my desk. I tried to call her back and left a voicemail asking for her to please return my call.

## 2015-03-13 NOTE — Telephone Encounter (Signed)
Patient returned call. I let her know that Malachy Mood said she has to do a face to face encounter with the patient for the referral. Patient has a physical scheduled for tomorrow so I told her we would handle all of that at her appointment tomorrow.

## 2015-03-13 NOTE — Telephone Encounter (Signed)
Called and left patient a voicemail asking for her to please return my call.  

## 2015-03-13 NOTE — Telephone Encounter (Signed)
Pt was returning your call.

## 2015-03-14 ENCOUNTER — Encounter: Payer: Self-pay | Admitting: Unknown Physician Specialty

## 2015-03-14 ENCOUNTER — Inpatient Hospital Stay: Payer: Commercial Managed Care - HMO

## 2015-03-14 ENCOUNTER — Ambulatory Visit (INDEPENDENT_AMBULATORY_CARE_PROVIDER_SITE_OTHER): Payer: Commercial Managed Care - HMO | Admitting: Unknown Physician Specialty

## 2015-03-14 VITALS — BP 114/69 | HR 93 | Temp 98.9°F | Ht 65.5 in | Wt 219.0 lb

## 2015-03-14 DIAGNOSIS — G894 Chronic pain syndrome: Secondary | ICD-10-CM

## 2015-03-14 DIAGNOSIS — K5669 Other intestinal obstruction: Secondary | ICD-10-CM

## 2015-03-14 DIAGNOSIS — Z7409 Other reduced mobility: Secondary | ICD-10-CM

## 2015-03-14 DIAGNOSIS — G4700 Insomnia, unspecified: Secondary | ICD-10-CM

## 2015-03-14 DIAGNOSIS — G252 Other specified forms of tremor: Secondary | ICD-10-CM | POA: Diagnosis not present

## 2015-03-14 DIAGNOSIS — K56609 Unspecified intestinal obstruction, unspecified as to partial versus complete obstruction: Secondary | ICD-10-CM

## 2015-03-14 DIAGNOSIS — M545 Low back pain, unspecified: Secondary | ICD-10-CM

## 2015-03-14 DIAGNOSIS — Z433 Encounter for attention to colostomy: Secondary | ICD-10-CM

## 2015-03-14 DIAGNOSIS — R0902 Hypoxemia: Secondary | ICD-10-CM | POA: Diagnosis not present

## 2015-03-14 NOTE — Assessment & Plan Note (Signed)
Taking 4 10 mg Percocet/day.  I will not take this over

## 2015-03-14 NOTE — Assessment & Plan Note (Signed)
Refer to neurology, however non-urgent referral as I suspect a combination of causes that include medications and current physical pain

## 2015-03-14 NOTE — Assessment & Plan Note (Signed)
Will be assessed by home health.  Consider home PT

## 2015-03-14 NOTE — Assessment & Plan Note (Signed)
Discuss with psychiatry

## 2015-03-14 NOTE — Assessment & Plan Note (Signed)
Per [facility] surgical

## 2015-03-14 NOTE — Progress Notes (Signed)
BP 114/69 mmHg  Pulse 93  Temp(Src) 98.9 F (37.2 C)  Ht 5' 5.5" (1.664 m)  Wt 219 lb (99.338 kg)  BMI 35.88 kg/m2  SpO2 98%   Subjective:    Patient ID: [name], female    DOB: Jul 01, 1955, 60 y.o.   MRN: [id number]  HPI: [name] is a 60 y.o. female  Chief Complaint  Patient presents with  . Hospitalization Follow-up  . Referral    pt states she needs a referral to [place] and [facility] following up for abnormal abdominal CT and bowel blockage. Bilateral ankle and foot swelling and abdominal swelling, possibly related to GI issues. Lives with a couple who are good friends and like unofficial caregivers. Home health  nurses need to assess ostomy and lungs/oxygen level due to excess fluid. Patient uses walker and cane and sleeps on pull out couch.    Has advanced directive with daughter, Stanton Kidney will make decisions. Patient states she trusts her daughter with this. Patient wants chest compressions if heart stops. Education on sequelae of chest compressions given, and patient states she is not quite ready to update status as DNR.   Patient states she needs referral to [place] due to fractured vertebrae in back and spinal stenosis.  Pt states that [employer] wanted to send her to [facility] for back surgery.  Back pain doesn't radiate to legs. Pt would like a refill of pain medications given through another physician who does not want to prescribe anymore.  I will make a referral to the pain clinic but I won't take over pain management due to multiple prescribers   Having trouble with balance and difficulty getting up from couch and toilet. Medicaid is sending someone out to house to give assistance with this.    Insomnia is bothering her.  States she wakes frequently through the night.    Relevant past medical, surgical, family and social history reviewed and updated as indicated. Interim medical history  since our last visit reviewed. Allergies and medications reviewed and updated.  Review of Systems  Constitutional: Negative.   HENT: Negative.   Eyes: Negative.   Respiratory: Negative.        Low SpO2  Cardiovascular: Negative.   Gastrointestinal:       Abdominal swelling. Small bowel obstruction.  Endocrine: Negative.   Genitourinary: Negative.   Musculoskeletal: Positive for back pain.  Allergic/Immunologic: Negative.   Neurological: Positive for tremors.       Balance issues  Psychiatric/Behavioral: Positive for sleep disturbance.    Per HPI unless specifically indicated above     Objective:    BP 114/69 mmHg  Pulse 93  Temp(Src) 98.9 F (37.2 C)  Ht 5' 5.5" (1.664 m)  Wt 219 lb (99.338 kg)  BMI 35.88 kg/m2  SpO2 98%  Wt Readings from Last 3 Encounters:  03/14/15 219 lb (99.338 kg)  03/12/15 223 lb 15.8 oz (101.6 kg)  03/11/15 232 lb 2.3 oz (105.3 kg)    Physical Exam  Constitutional: She is oriented to person, place, and time. She appears well-developed and well-nourished.  HENT:  Head: Normocephalic and atraumatic.  Eyes: Pupils are equal, round, and reactive to light. Right eye exhibits no discharge. Left eye exhibits no discharge. No scleral icterus.  Neck: Normal range of motion. Neck supple. Carotid bruit is not present. No thyromegaly present.  Cardiovascular: Normal rate, regular rhythm and normal heart sounds.  Exam reveals no gallop  and no friction rub.   No murmur heard. Pulmonary/Chest: Effort normal and breath sounds normal. No respiratory distress. She has no wheezes. She has no rales.  Abdominal:  Not performed. Followed by GI doctor.   Genitourinary: No breast swelling, tenderness or discharge.  Musculoskeletal: Normal range of motion.  Lymphadenopathy:    She has no cervical adenopathy.  Neurological: She is alert and oriented to person, place, and time.  Skin: Skin is warm, dry and intact. Ecchymosis noted. No rash noted.     4+ edema  bilateral ankles and feet  Psychiatric: She has a normal mood and affect. Her speech is normal and behavior is normal. Judgment and thought content normal. Cognition and memory are normal.    Results for orders placed or performed during the hospital encounter of 03/11/15  CBC with Differential  Result Value Ref Range   WBC 8.0 3.6 - 11.0 K/uL   RBC 3.21 (L) 3.80 - 5.20 MIL/uL   Hemoglobin 10.2 (L) 12.0 - 16.0 g/dL   HCT 30.5 (L) 35.0 - 47.0 %   MCV 94.8 80.0 - 100.0 fL   MCH 31.7 26.0 - 34.0 pg   MCHC 33.4 32.0 - 36.0 g/dL   RDW 16.7 (H) 11.5 - 14.5 %   Platelets 125 (L) 150 - 440 K/uL   Neutrophils Relative % 86 %   Lymphocytes Relative 10 %   Monocytes Relative 2 %   Eosinophils Relative 0 %   Basophils Relative 0 %   Band Neutrophils 1 %   Metamyelocytes Relative 0 %   Myelocytes 1 %   Promyelocytes Absolute 0 %   Blasts 0 %   nRBC 0 0 /100 WBC   Other 0 %   Neutro Abs 7.0 (H) 1.4 - 6.5 K/uL   Lymphs Abs 0.8 (L) 1.0 - 3.6 K/uL   Monocytes Absolute 0.2 0.2 - 0.9 K/uL   Eosinophils Absolute 0.0 0 - 0.7 K/uL   Basophils Absolute 0.0 0 - 0.1 K/uL   Smear Review MORPHOLOGY UNREMARKABLE   Basic metabolic panel  Result Value Ref Range   Sodium 142 135 - 145 mmol/L   Potassium 4.4 3.5 - 5.1 mmol/L   Chloride 108 101 - 111 mmol/L   CO2 28 22 - 32 mmol/L   Glucose, Bld 111 (H) 65 - 99 mg/dL   BUN 15 6 - 20 mg/dL   Creatinine, Ser 1.07 (H) 0.44 - 1.00 mg/dL   Calcium 8.2 (L) 8.9 - 10.3 mg/dL   GFR calc non Af Amer 56 (L) >60 mL/min   GFR calc Af Amer >60 >60 mL/min   Anion gap 6 5 - 15      Assessment & Plan:   Problem List Items Addressed This Visit      Unprioritized   LBP (low back pain) - Primary    Pt would like a refill of pain medications given through another physician who does not want to prescribe anymore.  I will make a referral to the pain clinic but I won't take over pain management due to multiple prescribers.  She states other doctors have already  referred her.  Defer Orthopedic referral until abdominal issues are diagnosed.  I doubt she is a candidate for back surgery       Relevant Orders   Ambulatory referral to Pain Clinic   Small bowel obstruction [facility])    Per [facility] surgical      Chronic pain syndrome    Taking 4 10 mg  Percocet/day.  I will not take this over      Relevant Orders   Ambulatory referral to Pain Clinic   Coarse tremors    Refer to neurology, however non-urgent referral as I suspect a combination of causes that include medications and current physical pain      Relevant Orders   Ambulatory referral to Neurology   Insomnia    Discuss with psychiatry      Poor mobility    Will be assessed by home health.  Consider home PT       Other Visit Diagnoses    Colostomy care [facility])        Relevant Orders    Ambulatory referral to [place]    Hypoxia        good control today.  Home health will monitor    Relevant Orders    Ambulatory referral to [place]        Follow up plan: Return in about 6 weeks (around 04/25/2015).

## 2015-03-14 NOTE — Assessment & Plan Note (Addendum)
Pt would like a refill of pain medications given through another physician who does not want to prescribe anymore.  I will make a referral to the pain clinic but I won't take over pain management due to multiple prescribers.  She states other doctors have already referred her.  Defer Orthopedic referral until abdominal issues are diagnosed.  I doubt she is a candidate for back surgery

## 2015-03-15 ENCOUNTER — Encounter: Payer: Self-pay | Admitting: Surgery

## 2015-03-15 ENCOUNTER — Emergency Department: Payer: Commercial Managed Care - HMO

## 2015-03-15 ENCOUNTER — Encounter: Payer: Self-pay | Admitting: *Deleted

## 2015-03-15 ENCOUNTER — Ambulatory Visit (INDEPENDENT_AMBULATORY_CARE_PROVIDER_SITE_OTHER): Payer: Commercial Managed Care - HMO | Admitting: Surgery

## 2015-03-15 ENCOUNTER — Inpatient Hospital Stay
Admission: EM | Admit: 2015-03-15 | Discharge: 2015-03-18 | DRG: 871 | Disposition: A | Discharge status: Home / Self Care | Payer: Commercial Managed Care - HMO | Attending: Internal Medicine | Admitting: Internal Medicine

## 2015-03-15 VITALS — BP 119/76 | HR 101 | Temp 102.3°F | Ht 66.0 in | Wt 219.6 lb

## 2015-03-15 DIAGNOSIS — Z8701 Personal history of pneumonia (recurrent): Secondary | ICD-10-CM

## 2015-03-15 DIAGNOSIS — Z86718 Personal history of other venous thrombosis and embolism: Secondary | ICD-10-CM | POA: Diagnosis not present

## 2015-03-15 DIAGNOSIS — A419 Sepsis, unspecified organism: Secondary | ICD-10-CM | POA: Diagnosis present

## 2015-03-15 DIAGNOSIS — R109 Unspecified abdominal pain: Secondary | ICD-10-CM | POA: Diagnosis not present

## 2015-03-15 DIAGNOSIS — R509 Fever, unspecified: Secondary | ICD-10-CM | POA: Diagnosis not present

## 2015-03-15 DIAGNOSIS — J189 Pneumonia, unspecified organism: Secondary | ICD-10-CM | POA: Diagnosis present

## 2015-03-15 DIAGNOSIS — J440 Chronic obstructive pulmonary disease with acute lower respiratory infection: Secondary | ICD-10-CM | POA: Diagnosis present

## 2015-03-15 DIAGNOSIS — I10 Essential (primary) hypertension: Secondary | ICD-10-CM | POA: Diagnosis present

## 2015-03-15 DIAGNOSIS — C210 Malignant neoplasm of anus, unspecified: Secondary | ICD-10-CM

## 2015-03-15 DIAGNOSIS — L03311 Cellulitis of abdominal wall: Secondary | ICD-10-CM | POA: Diagnosis present

## 2015-03-15 DIAGNOSIS — E785 Hyperlipidemia, unspecified: Secondary | ICD-10-CM | POA: Diagnosis present

## 2015-03-15 DIAGNOSIS — G4733 Obstructive sleep apnea (adult) (pediatric): Secondary | ICD-10-CM | POA: Diagnosis present

## 2015-03-15 DIAGNOSIS — J90 Pleural effusion, not elsewhere classified: Secondary | ICD-10-CM | POA: Diagnosis not present

## 2015-03-15 DIAGNOSIS — B9789 Other viral agents as the cause of diseases classified elsewhere: Secondary | ICD-10-CM | POA: Diagnosis present

## 2015-03-15 DIAGNOSIS — J441 Chronic obstructive pulmonary disease with (acute) exacerbation: Secondary | ICD-10-CM | POA: Diagnosis present

## 2015-03-15 DIAGNOSIS — F419 Anxiety disorder, unspecified: Secondary | ICD-10-CM | POA: Diagnosis present

## 2015-03-15 DIAGNOSIS — R1011 Right upper quadrant pain: Secondary | ICD-10-CM | POA: Insufficient documentation

## 2015-03-15 LAB — CBC
HEMATOCRIT: 31.8 % — AB (ref 35.0–47.0)
Hemoglobin: 10.6 g/dL — ABNORMAL LOW (ref 12.0–16.0)
MCH: 31.2 pg (ref 26.0–34.0)
MCHC: 33.5 g/dL (ref 32.0–36.0)
MCV: 93.1 fL (ref 80.0–100.0)
PLATELETS: 103 10*3/uL — AB (ref 150–440)
RBC: 3.41 MIL/uL — AB (ref 3.80–5.20)
RDW: 17.3 % — ABNORMAL HIGH (ref 11.5–14.5)
WBC: 9.7 10*3/uL (ref 3.6–11.0)

## 2015-03-15 LAB — URINALYSIS COMPLETE WITH MICROSCOPIC (ARMC ONLY)
BILIRUBIN URINE: NEGATIVE
Glucose, UA: NEGATIVE mg/dL
Ketones, ur: NEGATIVE mg/dL
LEUKOCYTES UA: NEGATIVE
Nitrite: NEGATIVE
PH: 8 (ref 5.0–8.0)
PROTEIN: NEGATIVE mg/dL
Specific Gravity, Urine: 1.013 (ref 1.005–1.030)

## 2015-03-15 LAB — COMPREHENSIVE METABOLIC PANEL
ALK PHOS: 64 U/L (ref 38–126)
ALT: 14 U/L (ref 14–54)
AST: 27 U/L (ref 15–41)
Albumin: 2.7 g/dL — ABNORMAL LOW (ref 3.5–5.0)
Anion gap: 5 (ref 5–15)
BUN: 15 mg/dL (ref 6–20)
CHLORIDE: 106 mmol/L (ref 101–111)
CO2: 25 mmol/L (ref 22–32)
CREATININE: 1.13 mg/dL — AB (ref 0.44–1.00)
Calcium: 8.1 mg/dL — ABNORMAL LOW (ref 8.9–10.3)
GFR calc non Af Amer: 52 mL/min — ABNORMAL LOW (ref >60)
Glucose, Bld: 92 mg/dL (ref 65–99)
POTASSIUM: 4.3 mmol/L (ref 3.5–5.1)
Sodium: 136 mmol/L (ref 135–145)
Total Bilirubin: 1 mg/dL (ref 0.3–1.2)
Total Protein: 5.9 g/dL — ABNORMAL LOW (ref 6.5–8.1)

## 2015-03-15 LAB — LIPASE, BLOOD: LIPASE: 17 U/L (ref 11–51)

## 2015-03-15 MED ORDER — OXYCODONE HCL 5 MG PO TABS
5.0000 mg | ORAL_TABLET | Freq: Once | ORAL | Status: AC
  Administered 2015-03-15: 5 mg via ORAL
  Filled 2015-03-15: qty 1

## 2015-03-15 MED ORDER — OXYCODONE-ACETAMINOPHEN 5-325 MG PO TABS
1.0000 | ORAL_TABLET | Freq: Once | ORAL | Status: AC
  Administered 2015-03-15: 1 via ORAL
  Filled 2015-03-15: qty 1

## 2015-03-15 MED ORDER — VANCOMYCIN HCL IN DEXTROSE 1-5 GM/200ML-% IV SOLN
1000.0000 mg | Freq: Once | INTRAVENOUS | Status: AC
  Administered 2015-03-15: 1000 mg via INTRAVENOUS
  Filled 2015-03-15: qty 200

## 2015-03-15 MED ORDER — IOHEXOL 300 MG/ML  SOLN
100.0000 mL | Freq: Once | INTRAMUSCULAR | Status: AC | PRN
  Administered 2015-03-15: 100 mL via INTRAVENOUS

## 2015-03-15 MED ORDER — IOHEXOL 240 MG/ML SOLN
25.0000 mL | Freq: Once | INTRAMUSCULAR | Status: AC | PRN
  Administered 2015-03-15: 25 mL via ORAL

## 2015-03-15 MED ORDER — PIPERACILLIN-TAZOBACTAM 3.375 G IVPB 30 MIN
3.3750 g | Freq: Once | INTRAVENOUS | Status: AC
  Administered 2015-03-15: 3.375 g via INTRAVENOUS
  Filled 2015-03-15: qty 50

## 2015-03-15 NOTE — ED Provider Notes (Signed)
[facility] Emergency Department Provider Note    ____________________________________________  Time seen: ~1805  I have reviewed the triage vital signs and the nursing notes.   HISTORY  Chief Complaint Fever   History limited by: Not Limited   HPI [name] is a 60 y.o. female who presents to the emergency department today from surgeon's office because of concerns for either and potentially recurrent pneumonia. The patient was recently hospitalized with a pneumonia. I personally evaluated the patient roughly 5 days agowhen she presented to the emergency department for concerns for lower extremity swelling. At that time patient had not been having any fevers. I did think the swelling could've been due to the IV fluids she received during her hospitalization. She states that for the past 2-3 days however she started having chills. She has felt some increasing shortness of breath. In addition she is complaining of abdominal pain. She states that it comes and goes. It will become severe. It feels like a knife into sharp. She states it is worse with movement. In addition she noticed redness around her ostomy site.    Past Medical History  Diagnosis Date  . Schizophrenia ([place])   . Asthma   . GERD (gastroesophageal reflux disease)   . Anxiety   . Depression   . Bipolar disorder ([place])   . COPD (chronic obstructive pulmonary disease) ([place])   . Occasional tremors     right hand  . PTSD (post-traumatic stress disorder)   . Shortness of breath dyspnea   . Fibromyalgia   . DVT (deep venous thrombosis) ([place]) 2011    RUE  . Thyroid nodule   . DDD (degenerative disc disease), lumbar   . Spinal stenosis   . Peripheral neuropathy ([place])   . Rotator cuff tear     right  . Pneumonia 2011  . Hypothyroidism     no meds currently  . Anemia     during pregnancy only  . Hypertension     Off meds x 15 years-well controlled now per pt  . Squamous cell  cancer, anus (HCC)   . Severe obstructive sleep apnea 06/27/2014  . DVT of upper extremity (deep vein thrombosis) ([place]) 10/14/2014    Patient Active Problem List   Diagnosis Date Noted  . Coarse tremors 03/14/2015  . Insomnia 03/14/2015  . Poor mobility 03/14/2015  . HCAP (healthcare-associated pneumonia) 03/05/2015  . 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 GI bleed 03/02/2015  . SBO (small bowel obstruction) ([place])   . Anemia   . COPD exacerbation ([place]) 02/27/2015  . Aspiration pneumonitis ([place]) 02/27/2015  . Small bowel obstruction ([place]) 02/27/2015  . Hyperglycemia 02/27/2015  . Chronic pain syndrome 02/27/2015  . COPD (chronic obstructive pulmonary disease) ([place]) 02/26/2015  . COPD, very severe ([place]) 12/12/2014  . Fibromyalgia 12/12/2014  . DVT of upper extremity (deep vein thrombosis) ([place]) 10/14/2014  . B12 deficiency 09/28/2014  . Folate deficiency 09/28/2014  . Macrocytosis 09/21/2014  . Depression   . Hypokalemia 07/20/2014  . Anal cancer ([place]) 06/27/2014  . Anal fissure 06/27/2014  . Anxiety 06/27/2014  . Airway hyperreactivity 06/27/2014  . H/O manic depressive disorder 06/27/2014  . CAFL (chronic airflow limitation) ([place]) 06/27/2014  . Clinical depression 06/27/2014  . Deep vein thrombosis ([place]) 06/27/2014  . External hemorrhoid 06/27/2014  . Myalgia and myositis 06/27/2014  . Acid reflux 06/27/2014  . Hemorrhoid 06/27/2014  . BP (high blood pressure) 06/27/2014  . HLD (  hyperlipidemia) 06/27/2014  . Adult hypothyroidism 06/27/2014  . LBP (low back pain) 06/27/2014  . Mass of perianal area 06/27/2014  . External hemorrhoid, thrombosed 06/27/2014  . 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 Ulcerated hemorrhoid 06/27/2014  . Severe obstructive sleep apnea 06/27/2014  . Squamous cell cancer, anus (HCC)   . Rectal ulcer 05/17/2014    Past Surgical History  Procedure Laterality Date  . Foot surgery Right   . Tubal ligation    .  Eye surgery Bilateral   . Mouth surgery  2002  . Rectal biopsy N/A 05/08/2014    Procedure: BIOPSY RECTAL;  Surgeon: Marlyce Huge, MD;  Location: [facility] ORS;  Service: General;  Laterality: N/A;  . Evaluation under anesthesia with hemorrhoidectomy N/A 05/08/2014    Procedure: EXAM UNDER ANESTHESIA WITH HEMORRHOIDECTOMY;  Surgeon: Marlyce Huge, MD;  Location: [facility] ORS;  Service: General;  Laterality: N/A;  . Portacath placement N/A 05/20/2014    Procedure: INSERTION PORT-A-CATH;  Surgeon: Florene Glen, MD;  Location: [facility] ORS;  Service: General;  Laterality: N/A;  . Laparoscopic diverted colostomy N/A 05/26/2014    Procedure: LAPAROSCOPIC DIVERTED COLOSTOMY;  Surgeon: Marlyce Huge, MD;  Location: [facility] ORS;  Service: General;  Laterality: N/A;  . Peripheral vascular catheterization Left 10/16/2014    Procedure: Upper Extremity Venography with thrombectomy, port removal;  Surgeon: Algernon Huxley, MD;  Location: [place] [facility];  Service: Cardiovascular;  Laterality: Left;  . Peripheral vascular catheterization  10/16/2014    Procedure: Upper Extremity Intervention;  Surgeon: Algernon Huxley, MD;  Location: [place] [facility];  Service: Cardiovascular;;    Current Outpatient Rx  Name  Route  Sig  Dispense  Refill  . albuterol (PROVENTIL HFA;VENTOLIN HFA) 108 (90 BASE) MCG/ACT inhaler   Inhalation   Inhale 2 puffs into the lungs every 6 (six) hours as needed for wheezing or shortness of breath.          Marland Kitchen albuterol (PROVENTIL) (2.5 MG/3ML) 0.083% nebulizer solution   Nebulization   Take 3 mLs (2.5 mg total) by nebulization every 4 (four) hours.   360 vial   12   . ARIPiprazole (ABILIFY) 2 MG tablet   Oral   Take 1 tablet (2 mg total) by mouth daily.   30 tablet   4   . baclofen (LIORESAL) 10 MG tablet   Oral   Take 1 tablet (10 mg total) by mouth 3 (three) times daily.   90 each   6   . budesonide-formoterol (SYMBICORT) 160-4.5 MCG/ACT inhaler    Inhalation   Inhale 2 puffs into the lungs 2 (two) times daily.   1 Inhaler   12   . Calcium Carb-Cholecalciferol (CALCIUM 600+D) 600-800 MG-UNIT TABS   Oral   Take 1 tablet by mouth 2 (two) times daily.          . clonazePAM (KLONOPIN) 1 MG tablet   Oral   Take 1 tablet (1 mg total) by mouth 3 (three) times daily as needed for anxiety.   90 tablet   4   . DULoxetine (CYMBALTA) 60 MG capsule   Oral   Take 60 mg by mouth at bedtime.         Marland Kitchen escitalopram (LEXAPRO) 20 MG tablet   Oral   Take 1 tablet (20 mg total) by mouth daily.   30 tablet   4     HOLD UNTIL PATIENT CALLS FOR REFILL   . folic acid (FOLVITE) 1 MG tablet  Oral   Take 1 tablet (1 mg total) by mouth daily.   30 tablet   3   . furosemide (LASIX) 20 MG tablet   Oral   Take 1 tablet (20 mg total) by mouth daily.   14 tablet   11   . ipratropium-albuterol (DUONEB) 0.5-2.5 (3) MG/3ML SOLN   Nebulization   Take 3 mLs by nebulization every 4 (four) hours as needed.   360 mL   6   . nicotine (NICODERM CQ - DOSED IN MG/24 HOURS) 21 mg/24hr patch   Transdermal   Place 1 patch (21 mg total) onto the skin daily.   28 patch   0   . nystatin (MYCOSTATIN) 100000 UNIT/ML suspension   Oral   Take 5 mLs (500,000 Units total) by mouth 4 (four) times daily.   60 mL   2   . oxyCODONE-acetaminophen (PERCOCET) 10-325 MG tablet   Oral   Take 1 tablet by mouth every 6 (six) hours as needed for pain.   30 tablet   0   . pantoprazole (PROTONIX) 40 MG tablet   Oral   Take 40 mg by mouth 2 (two) times daily.          . polyethylene glycol (MIRALAX / GLYCOLAX) packet   Oral   Take 17 g by mouth daily.   14 each   0   . pregabalin (LYRICA) 200 MG capsule   Oral   Take 1 capsule (200 mg total) by mouth 2 (two) times daily.   90 capsule   6   . senna-docusate (SENOKOT-S) 8.6-50 MG tablet   Oral   Take 1 tablet by mouth at bedtime as needed for mild constipation.   30 tablet   5   . tiotropium  (SPIRIVA) 18 MCG inhalation capsule   Inhalation   Place 1 capsule (18 mcg total) into inhaler and inhale daily.   30 capsule   12   . zolpidem (AMBIEN) 10 MG tablet   Oral   Take 1 tablet (10 mg total) by mouth at bedtime as needed for sleep.   30 tablet   4     Allergies Sulfa antibiotics  Family History  Problem Relation Age of Onset  . Cancer Maternal Aunt   . Cancer Paternal Uncle   . Diabetes Mother   . Thyroid disease Mother   . Kidney failure Mother   . Hypertension Mother   . Depression Mother   . Mental illness Son   . Aneurysm Maternal Grandmother   . Thyroid disease Maternal Grandmother   . Stroke Maternal Grandfather   . Hypertension Maternal Grandfather   . Diabetes Maternal Grandfather   . Heart disease Maternal Grandfather     MI  . Nephrolithiasis Daughter     Social History Social History  Substance Use Topics  . Smoking status: Current Some Day Smoker -- 0.10 packs/day for 44 years    Types: Cigarettes    Start date: 10/04/1970  . Smokeless tobacco: Never Used  . Alcohol Use: No     Comment: 1 drink every 2-3 times/year    Review of Systems  Constitutional: Negative for fever. Positive for chills. Cardiovascular: Negative for chest pain. Respiratory: Positive for shortness of breath. Gastrointestinal: Negative for abdominal pain, vomiting and diarrhea. Skin: Redness around her ostomy site Neurological: Negative for headaches, focal weakness or numbness.  10-point ROS otherwise negative.  ____________________________________________   PHYSICAL EXAM:  VITAL SIGNS: ED Triage Vitals  Enc Vitals  Group     BP 03/15/15 1435 124/65 mmHg     Pulse Rate 03/15/15 1435 62     Resp 03/15/15 1435 18     Temp 03/15/15 1435 100.2 F (37.9 C)     Temp Source 03/15/15 1435 Oral     SpO2 03/15/15 1435 95 %     Weight 03/15/15 1435 221 lb (100.245 kg)     Height 03/15/15 1435 '5\' 6"'$  (1.676 m)     Head Cir --      Peak Flow --      Pain Score  03/15/15 1458 9   Constitutional: Alert and oriented. Well appearing and in no distress. Eyes: Conjunctivae are normal. PERRL. Normal extraocular movements. ENT   Head: Normocephalic and atraumatic.   Nose: No congestion/rhinnorhea.   Mouth/Throat: Mucous membranes are moist.   Neck: No stridor. Hematological/Lymphatic/Immunilogical: No cervical lymphadenopathy. Cardiovascular: Normal rate, regular rhythm.  No murmurs, rubs, or gallops. Respiratory: Normal respiratory effort without tachypnea nor retractions. Breath sounds are clear and equal bilaterally. No wheezes/rales/rhonchi. Gastrointestinal: Soft and slightly tender diffusely. Ostomy in place. Some redness and warmth to skin around ostomy  Site.  Genitourinary: Deferred Musculoskeletal: Normal range of motion in all extremities. No joint effusions.  No lower extremity tenderness nor edema. Neurologic:  Normal speech and language. No gross focal neurologic deficits are appreciated.  Skin:  Erythema and warmth around ostomy site.  Psychiatric: Mood and affect are normal. Speech and behavior are normal. Patient exhibits appropriate insight and judgment.  ____________________________________________    LABS (pertinent positives/negatives)  Labs Reviewed  COMPREHENSIVE METABOLIC PANEL - Abnormal; Notable for the following:    Creatinine, Ser 1.13 (*)    Calcium 8.1 (*)    Total Protein 5.9 (*)    Albumin 2.7 (*)    GFR calc non Af Amer 52 (*)    All other components within normal limits  CBC - Abnormal; Notable for the following:    RBC 3.41 (*)    Hemoglobin 10.6 (*)    HCT 31.8 (*)    RDW 17.3 (*)    Platelets 103 (*)    All other components within normal limits  LIPASE, BLOOD  URINALYSIS COMPLETEWITH MICROSCOPIC ([facility] ONLY)     ____________________________________________   EKG  None  ____________________________________________    RADIOLOGY  CXR IMPRESSION: 1. Dense opacity at the right  lung base, posteriorly based on the lateral projection. This could represent atelectasis or pneumonia. A similar consolidation was seen at the right lung base on the recent chest CT of 03/05/2015, also suspected to be pneumonia at that time. 2. Chronic compression fracture deformities within the lower thoracic spine.  CT abd/pel  IMPRESSION: 1. The ostomy site appears relatively similar to prior, likely a side ostomy site involving the sigmoid colon given the presence of a fair it in the efferent loops, and containing some of the ascites from the abdomen. There is increase in subcutaneous edema diffusely attributed to third spacing of fluid, and underlying ascites is similar to prior. Much of this is likely attributable to the patient's hypoproteinemia and hypoalbuminemia. 2. The loculated right basilar pleural effusion has nodular it densities along the pleural margin concerning for possible pleural tumor. 3. There is some thickened loops of jejunum with scattered air- fluid levels possibly from ileus or inflammation. However, some of this appearance may also be due to the patient's hypoproteinemia and hypoalbuminemia. 4. Infrarenal abdominal aortic aneurysm, 4.0 by 4.5 cm. Recommend followup by abdomen and pelvis  CTA in 6 months, and vascular surgery referral/consultation if not already obtained. This recommendation follows ACR consensus guidelines: White Paper of the ACR Incidental Findings Committee II on Vascular Findings. J Am Coll Radiol 2013; 10:789-794. 5. Stable compression fractures at T9, T10, and T12.   ____________________________________________   PROCEDURES  Procedure(s) performed: None  Critical Care performed: No  ____________________________________________   INITIAL IMPRESSION / ASSESSMENT AND PLAN / ED COURSE  Pertinent labs & imaging results that were available during my care of the patient were reviewed by me and considered in my medical decision  making (see chart for details).  Patient presented to the emergency department today because of concerns for fevers. Patient recently had a hospitalization for pneumonia. Was at surgeon's office today when the fever was noted. Chest x-ray here is concerning for potential pneumonia. Patient was febrile tachycardic on initial vital signs. On exam patient did have some concerning signs for cellulitis from the side of the ostomy. Because of the surgery was consulted. They felt that likely the fever is more related to pneumonia. Will plan on admission to the hospital service.  ____________________________________________   FINAL CLINICAL IMPRESSION(S) / ED DIAGNOSES  Final diagnoses:  Fever, unspecified fever cause  Recurrent pneumonia     Nance Pear, MD 03/15/15 2352

## 2015-03-15 NOTE — Consult Note (Signed)
Patient ID: [name], female   DOB: 1955-02-01, 60 y.o.   MRN: [id number]  CC: Fevers  HPI [name] is a 60 y.o. female was seen earlier today in clinic by my partner Dr. Burt Knack. From clinic she was sent to the ER secondary to recurrence of fevers. She was recently discharged from the medical service for pneumonia and COPD exacerbation. Per the patient, and the note from Dr. Burt Knack earlier, patient has been having occasional crampy abdominal pain that comes and goes. She has continued to have ostomy output. Patient denies any nausea or vomiting and has been having ostomy output. She does complain of some shortness of breath but states this appears like her usual COPD. Patient has had a feeling of weakness as well as malaise.  HPI  Past Medical History  Diagnosis Date  . Schizophrenia ([place])   . Asthma   . GERD (gastroesophageal reflux disease)   . Anxiety   . Depression   . Bipolar disorder ([place])   . COPD (chronic obstructive pulmonary disease) ([place])   . Occasional tremors     right hand  . PTSD (post-traumatic stress disorder)   . Shortness of breath dyspnea   . Fibromyalgia   . DVT (deep venous thrombosis) ([place]) 2011    RUE  . Thyroid nodule   . DDD (degenerative disc disease), lumbar   . Spinal stenosis   . Peripheral neuropathy ([place])   . Rotator cuff tear     right  . Pneumonia 2011  . Hypothyroidism     no meds currently  . Anemia     during pregnancy only  . Hypertension     Off meds x 15 years-well controlled now per pt  . Squamous cell cancer, anus ([facility])   . Severe obstructive sleep apnea 06/27/2014  . DVT of upper extremity (deep vein thrombosis) ([place]) 10/14/2014    Past Surgical History  Procedure Laterality Date  . Foot surgery Right   . Tubal ligation    . Eye surgery Bilateral   . Mouth surgery  2002  . Rectal biopsy N/A 05/08/2014    Procedure: BIOPSY RECTAL;  Surgeon: Marlyce Huge, MD;  Location: [facility] ORS;  Service: General;   Laterality: N/A;  . Evaluation under anesthesia with hemorrhoidectomy N/A 05/08/2014    Procedure: EXAM UNDER ANESTHESIA WITH HEMORRHOIDECTOMY;  Surgeon: Marlyce Huge, MD;  Location: [facility] OR[facility];  Service: General;  Laterality: N/A;  . Portacath placement N/A 05/20/2014    Procedure: INSERTION PORT-A-CATH;  Surgeon: Florene Glen, MD;  Location: [facility] OR[facility];  Service: General;  Laterality: N/A;  . Laparoscopic diverted colostomy N/A 05/26/2014    Procedure: LAPAROSCOPIC DIVERTED COLOSTOMY;  Surgeon: Marlyce Huge, MD;  Location: [facility] OR[facility];  Service: General;  Laterality: N/A;  . Peripheral vascular catheterization Left 10/16/2014    Procedure: Upper Extremity Venography with thrombectomy, port removal;  Surgeon: Algernon Huxley, MD;  Location: [place] [facility];  Service: Cardiovascular;  Laterality: Left;  . Peripheral vascular catheterization  10/16/2014    Procedure: Upper Extremity Intervention;  Surgeon: Algernon Huxley, MD;  Location: [place] [facility];  Service: Cardiovascular;;    Family History  Problem Relation Age of Onset  . Cancer Maternal Aunt   . Cancer Paternal Uncle   . Diabetes Mother   . Thyroid disease Mother   . Kidney failure Mother   . Hypertension Mother   . Depression Mother   . Mental illness Son   . Aneurysm Maternal Grandmother   .  Thyroid disease Maternal Grandmother   . Stroke Maternal Grandfather   . Hypertension Maternal Grandfather   . Diabetes Maternal Grandfather   . Heart disease Maternal Grandfather     MI  . Nephrolithiasis Daughter     Social History Social History  Substance Use Topics  . Smoking status: Current Some Day Smoker -- 0.10 packs/day for 44 years    Types: Cigarettes    Start date: 10/04/1970  . Smokeless tobacco: Never Used  . Alcohol Use: No     Comment: 1 drink every 2-3 times/year    Allergies  Allergen Reactions  . Sulfa Antibiotics Hives    Current Facility-Administered Medications  Medication Dose  Route Frequency Provider Last Rate Last Dose  . vancomycin (VANCOCIN) IVPB 1000 mg/200 mL premix  1,000 mg Intravenous Once Nance Pear, MD 200 mL/hr at 03/15/15 2046 1,000 mg at 03/15/15 2046   Current Outpatient Prescriptions  Medication Sig Dispense Refill  . albuterol (PROVENTIL HFA;VENTOLIN HFA) 108 (90 BASE) MCG/ACT inhaler Inhale 2 puffs into the lungs every 6 (six) hours as needed for wheezing or shortness of breath.     Marland Kitchen albuterol (PROVENTIL) (2.5 MG/3ML) 0.083% nebulizer solution Take 3 mLs (2.5 mg total) by nebulization every 4 (four) hours. 360 vial 12  . ARIPiprazole (ABILIFY) 2 MG tablet Take 1 tablet (2 mg total) by mouth daily. 30 tablet 4  . baclofen (LIORESAL) 10 MG tablet Take 1 tablet (10 mg total) by mouth 3 (three) times daily. 90 each 6  . budesonide-formoterol (SYMBICORT) 160-4.5 MCG/ACT inhaler Inhale 2 puffs into the lungs 2 (two) times daily. 1 Inhaler 12  . Calcium Carb-Cholecalciferol (CALCIUM 600+D) 600-800 MG-UNIT TABS Take 1 tablet by mouth 2 (two) times daily.     . clonazePAM (KLONOPIN) 1 MG tablet Take 1 tablet (1 mg total) by mouth 3 (three) times daily as needed for anxiety. 90 tablet 4  . DULoxetine (CYMBALTA) 60 MG capsule Take 60 mg by mouth at bedtime.    Marland Kitchen escitalopram (LEXAPRO) 20 MG tablet Take 1 tablet (20 mg total) by mouth daily. 30 tablet 4  . folic acid (FOLVITE) 1 MG tablet Take 1 tablet (1 mg total) by mouth daily. 30 tablet 3  . furosemide (LASIX) 20 MG tablet Take 1 tablet (20 mg total) by mouth daily. 14 tablet 11  . ipratropium-albuterol (DUONEB) 0.5-2.5 (3) MG/3ML SOLN Take 3 mLs by nebulization every 4 (four) hours as needed. 360 mL 6  . nicotine (NICODERM CQ - DOSED IN MG/24 HOURS) 21 mg/24hr patch Place 1 patch (21 mg total) onto the skin daily. 28 patch 0  . nystatin (MYCOSTATIN) 100000 UNIT/ML suspension Take 5 mLs (500,000 Units total) by mouth 4 (four) times daily. 60 mL 2  . [START ON 03/16/2015] oxyCODONE-acetaminophen (PERCOCET)  10-325 MG tablet Take 1 tablet by mouth every 6 (six) hours as needed for pain. 30 tablet 0  . pantoprazole (PROTONIX) 40 MG tablet Take 40 mg by mouth 2 (two) times daily.     . polyethylene glycol (MIRALAX / GLYCOLAX) packet Take 17 g by mouth daily. 14 each 0  . pregabalin (LYRICA) 200 MG capsule Take 1 capsule (200 mg total) by mouth 2 (two) times daily. (Patient taking differently: Take 200 mg by mouth 2 (two) times daily as needed. ) 90 capsule 6  . senna-docusate (SENOKOT-S) 8.6-50 MG tablet Take 1 tablet by mouth at bedtime as needed for mild constipation. 30 tablet 5  . tiotropium (SPIRIVA) 18 MCG inhalation capsule  Place 1 capsule (18 mcg total) into inhaler and inhale daily. 30 capsule 12  . zolpidem (AMBIEN) 10 MG tablet Take 1 tablet (10 mg total) by mouth at bedtime as needed for sleep. 30 tablet 4  . [DISCONTINUED] apixaban (ELIQUIS) 5 MG TABS tablet Take 1 tablet (5 mg total) by mouth 2 (two) times daily. 60 tablet 2   Facility-Administered Medications Ordered in Other Encounters  Medication Dose Route Frequency Provider Last Rate Last Dose  . heparin lock flush 100 unit/mL  500 Units Intravenous Once Lequita Asal, MD      . sodium chloride 0.9 % injection 10 mL  10 mL Intravenous PRN Lequita Asal, MD         Review of Systems A Multi-point review of systems was asked and was negative except for the findings documented in the history of present illness  Physical Exam Blood pressure 116/54, pulse 79, temperature 98.6 F (37 C), temperature source Oral, resp. rate 16, height 5' 6.5" (1.689 m), weight 100.245 kg (221 lb), SpO2 100 %. CONSTITUTIONAL: No acute distress. EYES: Pupils are equal, round, and reactive to light, Sclera are non-icteric. EARS, NOSE, MOUTH AND THROAT: The oropharynx is clear. The oral mucosa is pink and moist. Hearing is intact to voice. LYMPH NODES:  Lymph nodes in the neck are normal. RESPIRATORY:  Lungs are clear. There is normal  respiratory effort, with equal breath sounds bilaterally, and without pathologic use of accessory muscles. CARDIOVASCULAR: Heart is regular without murmurs, gallops, or rubs. GI: The abdomen is soft, nondistended. Ostomy present that is functional. 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 Diffuse tenderness across the abdomen but without any peritoneal signs. There is some erythema of the abdominal wall with rubor that extends across the midline but without any evidence of fluctuance or induration. GU: Rectal deferred.   MUSCULOSKELETAL: Bilateral lower extremity edema.   SKIN: Turgor is good and there are no pathologic skin lesions or ulcers. NEUROLOGIC: Motor and sensation is grossly normal. Cranial nerves are grossly intact. PSYCH:  Oriented to person, place and time. Affect is normal.  Data Reviewed Images and labs reviewed. CT scan of the abdomen shows the known parastomal hernia but without any abscess or obstruction. The does appear to be anasarca throughout. On the CT scan and the chest x-ray there appears to be a pneumonia versus pleural effusion. Labs show a mildly elevated creatinine of 1.13, hypoalbuminemia at 2.7, mild anemia at 10.6 over 31.8. Normal white blood cell count of 9.7. I have personally reviewed the patient's imaging, laboratory findings and medical records.    Assessment    60 year old female with fevers likely secondary to pneumonia    Plan    60 year old female with fevers that appear to be related to a pneumonia or other pulmonary source. She also has a parastomal hernia but this is not obstructing or bowel containing. She has a functioning ostomy at this time. Mild erythema across her abdomen is of unclear origin or significance. Recommend internal medicine admission for continued evaluation and treatment of her fevers, possible pneumonia, COPD. Surgery will follow at this time however no plans or indications for any surgical  intervention at this time.     Time spent with the patient was 30 minutes, with more than 50% of the time spent in face-to-face education, counseling and care coordination.     Clayburn Pert, MD FACS General Surgeon 03/15/2015, 9:42 PM

## 2015-03-15 NOTE — ED Notes (Signed)
pts colostomy bag emptied.

## 2015-03-15 NOTE — ED Notes (Signed)
Pt sent from Dr Antionette Char office for redness, pain around colostomy, and fever

## 2015-03-15 NOTE — ED Notes (Signed)
Dr. Adonis Huguenin in room to evaluate patient.

## 2015-03-15 NOTE — Patient Instructions (Signed)
We are sending you to the Emergency Room at this time. I will make a call to the charge nurse to let them know that you are on your way.

## 2015-03-15 NOTE — H&P (Signed)
[facility] [place] NAME: [name]    MR#:  [id number]  DATE OF BIRTH:  September 15, 1955  DATE OF ADMISSION:  03/15/2015  PRIMARY CARE PHYSICIAN: [name], NP   REQUESTING/REFERRING PHYSICIAN: Archie Balboa, M.D.  CHIEF COMPLAINT:   Chief Complaint  Patient presents with  . Fever    redness at surgery site    HISTORY OF PRESENT ILLNESS:  [name]  is a 60 y.o. female who presents with fever, dyspnea. Patient was recently admitted here with pneumonia. Shortly prior that she had abdominal surgery with colostomy. She was in her surgeon's office for follow-up visit today and was stating that she felt like she may be developing something of a cellulitis. She also mentioned that she has been increasingly more dyspneic recently. Her surgeon on physical exam felt like she had some lung findings and that she might be developing fluid cellulitis around her ostomy site, and so sent her to the ED for evaluation and possible admission. Patient was febrile and tachycardic on arrival. Chest x-ray here was consistent with pneumonia. Hospitalists were called for admission  PAST MEDICAL HISTORY:   Past Medical History  Diagnosis Date  . Schizophrenia ([place])   . Asthma   . GERD (gastroesophageal reflux disease)   . Anxiety   . Depression   . Bipolar disorder ([place])   . COPD (chronic obstructive pulmonary disease) ([place])   . Occasional tremors     right hand  . PTSD (post-traumatic stress disorder)   . Shortness of breath dyspnea   . Fibromyalgia   . DVT (deep venous thrombosis) ([place]) 2011    RUE  . Thyroid nodule   . DDD (degenerative disc disease), lumbar   . Spinal stenosis   . Peripheral neuropathy ([place])   . Rotator cuff tear     right  . Pneumonia 2011  . Hypothyroidism     no meds currently  . Anemia     during pregnancy only  . Hypertension     Off meds x 15 years-well controlled now per pt  . Squamous cell cancer, anus  (H[facility])   . Severe obstructive sleep apnea 06/27/2014  . DVT of upper extremity (deep vein thrombosis) ([place]) 10/14/2014    PAST SURGICAL HISTORY:   Past Surgical History  Procedure Laterality Date  . Foot surgery Right   . Tubal ligation    . Eye surgery Bilateral   . Mouth surgery  2002  . Rectal biopsy N/A 05/08/2014    Procedure: BIOPSY RECTAL;  Surgeon: Marlyce Huge, MD;  Location: [facility]S;  Service: General;  Laterality: N/A;  . Evaluation under anesthesia with hemorrhoidectomy N/A 05/08/2014    Procedure: EXAM UNDER ANESTHESIA WITH HEMORRHOIDECTOMY;  Surgeon: Marlyce Huge, MD;  Location: [facility];  Service: General;  Laterality: N/A;  . Portacath placement N/A 05/20/2014    Procedure: INSERTION PORT-A-CATH;  Surgeon: Florene Glen, MD;  Location: [facility];  Service: General;  Laterality: N/A;  . Laparoscopic diverted colostomy N/A 05/26/2014    Procedure: LAPAROSCOPIC DIVERTED COLOSTOMY;  Surgeon: Marlyce Huge, MD;  Location: [facility];  Service: General;  Laterality: N/A;  . Peripheral vascular catheterization Left 10/16/2014    Procedure: Upper Extremity Venography with thrombectomy, port removal;  Surgeon: Algernon Huxley, MD;  Location: [place] [facility];  Service: Cardiovascular;  Laterality: Left;  . Peripheral vascular catheterization  10/16/2014    Procedure: Upper Extremity Intervention;  Surgeon: Algernon Huxley, MD;  Location: [place] [facility];  Service: Cardiovascular;;    SOCIAL HISTORY:   Social History  Substance Use Topics  . Smoking status: Current Some Day Smoker -- 0.10 packs/day for 44 years    Types: Cigarettes    Start date: 10/04/1970  . Smokeless tobacco: Never Used  . Alcohol Use: No     Comment: 1 drink every 2-3 times/year    FAMILY HISTORY:   Family History  Problem Relation Age of Onset  . Cancer Maternal Aunt   . Cancer Paternal Uncle   . Diabetes Mother   . Thyroid disease Mother   . Kidney failure Mother   .  Hypertension Mother   . Depression Mother   . Mental illness Son   . Aneurysm Maternal Grandmother   . Thyroid disease Maternal Grandmother   . Stroke Maternal Grandfather   . Hypertension Maternal Grandfather   . Diabetes Maternal Grandfather   . Heart disease Maternal Grandfather     MI  . Nephrolithiasis Daughter     DRUG ALLERGIES:   Allergies  Allergen Reactions  . Sulfa Antibiotics Hives    MEDICATIONS AT HOME:   Prior to Admission medications   Medication Sig Start Date End Date Taking? Authorizing Provider  albuterol (PROVENTIL HFA;VENTOLIN HFA) 108 (90 BASE) MCG/ACT inhaler Inhale 2 puffs into the lungs every 6 (six) hours as needed for wheezing or shortness of breath.    Yes Historical Provider, MD  albuterol (PROVENTIL) (2.5 MG/3ML) 0.083% nebulizer solution Take 3 mLs (2.5 mg total) by nebulization every 4 (four) hours. 02/27/15  Yes Theodoro Grist, MD  ARIPiprazole (ABILIFY) 2 MG tablet Take 1 tablet (2 mg total) by mouth daily. 01/31/15  Yes Marjie Skiff, MD  baclofen (LIORESAL) 10 MG tablet Take 1 tablet (10 mg total) by mouth 3 (three) times daily. 02/09/15  Yes [name], NP  budesonide-formoterol (SYMBICORT) 160-4.5 MCG/ACT inhaler Inhale 2 puffs into the lungs 2 (two) times daily. 01/30/15  Yes [name], NP  Calcium Carb-Cholecalciferol (CALCIUM 600+D) 600-800 MG-UNIT TABS Take 1 tablet by mouth 2 (two) times daily.    Yes Historical Provider, MD  clonazePAM (KLONOPIN) 1 MG tablet Take 1 tablet (1 mg total) by mouth 3 (three) times daily as needed for anxiety. 01/31/15  Yes Marjie Skiff, MD  DULoxetine (CYMBALTA) 60 MG capsule Take 60 mg by mouth at bedtime.   Yes Historical Provider, MD  escitalopram (LEXAPRO) 20 MG tablet Take 1 tablet (20 mg total) by mouth daily. 01/31/15  Yes Marjie Skiff, MD  folic acid (FOLVITE) 1 MG tablet Take 1 tablet (1 mg total) by mouth daily. 03/12/15  Yes Lequita Asal, MD  furosemide (LASIX) 20 MG tablet Take 1  tablet (20 mg total) by mouth daily. 03/11/15 03/10/16 Yes Lavonia Drafts, MD  ipratropium-albuterol (DUONEB) 0.5-2.5 (3) MG/3ML SOLN Take 3 mLs by nebulization every 4 (four) hours as needed. 03/02/15  Yes Theodoro Grist, MD  nicotine (NICODERM CQ - DOSED IN MG/24 HOURS) 21 mg/24hr patch Place 1 patch (21 mg total) onto the skin daily. 03/02/15  Yes Theodoro Grist, MD  nystatin (MYCOSTATIN) 100000 UNIT/ML suspension Take 5 mLs (500,000 Units total) by mouth 4 (four) times daily. 03/09/15  Yes [name], NP  oxyCODONE-acetaminophen (PERCOCET) 10-325 MG tablet Take 1 tablet by mouth every 6 (six) hours as needed for pain. 03/16/15  Yes Lequita Asal, MD  pantoprazole (PROTONIX) 40 MG tablet Take 40 mg by mouth 2 (two) times daily.  Yes Historical Provider, MD  polyethylene glycol (MIRALAX / GLYCOLAX) packet Take 17 g by mouth daily. 03/02/15  Yes Theodoro Grist, MD  pregabalin (LYRICA) 200 MG capsule Take 1 capsule (200 mg total) by mouth 2 (two) times daily. Patient taking differently: Take 200 mg by mouth 2 (two) times daily as needed.  01/23/15  Yes [name], NP  senna-docusate (SENOKOT-S) 8.6-50 MG tablet Take 1 tablet by mouth at bedtime as needed for mild constipation. 02/27/15  Yes Theodoro Grist, MD  tiotropium (SPIRIVA) 18 MCG inhalation capsule Place 1 capsule (18 mcg total) into inhaler and inhale daily. 02/26/15  Yes [name], NP  zolpidem (AMBIEN) 10 MG tablet Take 1 tablet (10 mg total) by mouth at bedtime as needed for sleep. 01/31/15  Yes Marjie Skiff, MD    REVIEW OF SYSTEMS:  Review of Systems  Constitutional: Positive for fever and malaise/fatigue. Negative for chills and weight loss.  HENT: Negative for ear pain, hearing loss and tinnitus.   Eyes: Negative for blurred vision, double vision, pain and redness.  Respiratory: Positive for cough, shortness of breath and wheezing. Negative for hemoptysis.   Cardiovascular: Negative for chest pain, palpitations, orthopnea and  leg swelling.  Gastrointestinal: Negative for nausea, vomiting, abdominal pain, diarrhea and constipation.  Genitourinary: Negative for dysuria, frequency and hematuria.  Musculoskeletal: Negative for back pain, joint pain and neck pain.  Skin:       Erythema and tenderness around ostomy site No acne, rash, or lesions  Neurological: Negative for dizziness, tremors, focal weakness and weakness.  Endo/Heme/Allergies: Negative for polydipsia. Does not bruise/bleed easily.  Psychiatric/Behavioral: Negative for depression. The patient is not nervous/anxious and does not have insomnia.      VITAL SIGNS:   Filed Vitals:   03/15/15 2215 03/15/15 2238 03/15/15 2330 03/16/15 0030  BP: 107/98 120/42 115/66 110/76  Pulse: 82 77 78 86  Temp:  100.4 F (38 C)    TempSrc:  Oral    Resp: 16 16    Height:      Weight:      SpO2: 100% 99% 100% 95%   Wt Readings from Last 3 Encounters:  03/15/15 100.245 kg (221 lb)  03/15/15 99.61 kg (219 lb 9.6 oz)  03/14/15 99.338 kg (219 lb)    PHYSICAL EXAMINATION:  Physical Exam  Vitals reviewed. Constitutional: She is oriented to person, place, and time. She appears well-developed and well-nourished. No distress.  HENT:  Head: Normocephalic and atraumatic.  Mouth/Throat: Oropharynx is clear and moist.  Eyes: Conjunctivae and EOM are normal. Pupils are equal, round, and reactive to light. No scleral icterus.  Neck: Normal range of motion. Neck supple. No JVD present. No thyromegaly present.  Cardiovascular: Normal rate, regular rhythm and intact distal pulses.  Exam reveals no gallop and no friction rub.   No murmur heard. Respiratory: Effort normal. No respiratory distress. She has wheezes (diffuse bilateral). She has no rales.  GI: Soft. Bowel sounds are normal. She exhibits no distension. There is no tenderness.  Musculoskeletal: Normal range of motion. She exhibits edema.  No arthritis, no gout  Lymphadenopathy:    She has no cervical adenopathy.   Neurological: She is alert and oriented to person, place, and time. No cranial nerve deficit.  No dysarthria, no aphasia  Skin: Skin is warm and dry. No rash noted. There is erythema (And tenderness around ostomy site).  Psychiatric: She has a normal mood and affect. Her behavior is normal. Judgment and thought content normal.  LABORATORY PANEL:   CBC  Recent Labs Lab 03/15/15 1558  WBC 9.7  HGB 10.6*  HCT 31.8*  PLT 103*   ------------------------------------------------------------------------------------------------------------------  Chemistries   Recent Labs Lab 03/15/15 1558  NA 136  K 4.3  CL 106  CO2 25  GLUCOSE 92  BUN 15  CREATININE 1.13*  CALCIUM 8.1*  AST 27  ALT 14  ALKPHOS 64  BILITOT 1.0   ------------------------------------------------------------------------------------------------------------------  Cardiac Enzymes No results for input(s): TROPONINI in the last 168 hours. ------------------------------------------------------------------------------------------------------------------  RADIOLOGY:  Dg Chest 2 View  03/15/2015  CLINICAL DATA:  Fever, back pain, abdominal pain. EXAM: CHEST  2 VIEW COMPARISON:  Chest x-ray and chest CT dated 03/05/2015. Comparison is also made to chest x-ray dated 07/24/2014. FINDINGS: There is a dense opacity at the right lung base, pneumonia versus atelectasis. Left lung is clear. Heart size is normal. Overall cardiomediastinal silhouette is stable in size and configuration. Severe compression fracture deformities are again noted within the lower thoracic spine, stable. No acute- appearing osseous abnormality. IMPRESSION: 1. Dense opacity at the right lung base, posteriorly based on the lateral projection. This could represent atelectasis or pneumonia. A similar consolidation was seen at the right lung base on the recent chest CT of 03/05/2015, also suspected to be pneumonia at that time. 2. Chronic compression  fracture deformities within the lower thoracic spine. Electronically Signed   By: Franki Cabot M.D.   On: 03/15/2015 17:47   Ct Abdomen Pelvis W Contrast  03/15/2015  CLINICAL DATA:  Redness and fever at colostomy site. Abdominal pain and fever. Colostomy because of rectal cancer. EXAM: CT ABDOMEN AND PELVIS WITH CONTRAST TECHNIQUE: Multidetector CT imaging of the abdomen and pelvis was performed using the standard protocol following bolus administration of intravenous contrast. CONTRAST:  180m OMNIPAQUE IOHEXOL 300 MG/ML  SOLN COMPARISON:  03/02/2015 and 02/27/2015 FINDINGS: Lower chest: Nodular pleural thickening on the right along the loculated pleural effusion, suspicious for malignant involvement of the pleural space on the right. Adjacent passive atelectasis noted. Hepatobiliary: Contracted gallbladder. Pancreas: Unremarkable Spleen: Unremarkable Adrenals/Urinary Tract: Unremarkable Stomach/Bowel: Scattered air- fluid levels in mildly thickened loops of ileum in the pelvis. Jejunal folds unremarkable. No dilated bowel. Left lower quadrant ostomy contains a loop of sigmoid colon possibly with a side anastomosis for the ostomy given the efferent in the efferent loops. Vascular/Lymphatic: Aortoiliac atherosclerotic vascular disease. Infrarenal abdominal aortic aneurysm 4.5 by 4.0 cm, image 48 series 2. No pathologic adenopathy. Reproductive: Unremarkable Other: Considerable ascites. Scattered mesenteric edema. Considerable subcutaneous edema. There is ascites in the peristomal hernia which also contains adipose tissue from the omentum. I do not see obvious caking of tumor along the ascites or omentum to further suggest peritoneal spread of tumor. Musculoskeletal: We again observed compression fractures at T9, T10, and T12, with the T10 and T12 vertebral fractures demonstrating severe loss of height. IMPRESSION: 1. The ostomy site appears relatively similar to prior, likely a side ostomy site involving the  sigmoid colon given the presence of a fair it in the efferent loops, and containing some of the ascites from the abdomen. There is increase in subcutaneous edema diffusely attributed to third spacing of fluid, and underlying ascites is similar to prior. Much of this is likely attributable to the patient's hypoproteinemia and hypoalbuminemia. 2. The loculated right basilar pleural effusion has nodular it densities along the pleural margin concerning for possible pleural tumor. 3. There is some thickened loops of jejunum with scattered air- fluid levels possibly from ileus  or inflammation. However, some of this appearance may also be due to the patient's hypoproteinemia and hypoalbuminemia. 4. Infrarenal abdominal aortic aneurysm, 4.0 by 4.5 cm. Recommend followup by abdomen and pelvis CTA in 6 months, and vascular surgery referral/consultation if not already obtained. This recommendation follows ACR consensus guidelines: White Paper of the ACR Incidental Findings Committee II on Vascular Findings. J Am Coll Radiol 2013; 10:789-794. 5. Stable compression fractures at T9, T10, and T12. Electronically Signed   By: Van Clines M.D.   On: 03/15/2015 19:40    EKG:   Orders placed or performed during the hospital encounter of 03/05/15  . EKG 12-Lead  . EKG 12-Lead  . ED EKG  . ED EKG    IMPRESSION AND PLAN:  Principal Problem:   Sepsis ([place]) - broad spectrum antibiotics started in the ED. Cultures sent. Patient is hemodynamically stable. We'll check a lactic acid. Active Problems:   COPD exacerbation ([place]) - likely due to her pneumonia, will use IV steroids in addition to antibiotics. When necessary nebs.   HCAP (healthcare-associated pneumonia) - antibiotics, cultures, nebs, when necessary antitussive   Anxiety - home dose anxiolytics   HTN (hypertension) - stable, continue home meds   Severe obstructive sleep apnea - BiPAP daily at bedtime   HLD (hyperlipidemia) - continue home meds  All the  records are reviewed and case discussed with ED provider. Management plans discussed with the patient and/or family.  DVT PROPHYLAXIS: SubQ lovenox  GI PROPHYLAXIS: None  ADMISSION STATUS: Inpatient  CODE STATUS: Full Code Status History    Date Active Date Inactive Code Status Order ID Comments User Context   03/05/2015  6:17 AM 03/07/2015  8:09 PM Full Code [id number]  [name], MD Inpatient   02/26/2015  5:43 PM 03/03/2015  6:54 PM Full Code [id number]  Bettey Costa, MD Inpatient   10/14/2014 10:59 PM 10/17/2014  4:34 PM Full Code [id number]  Lytle Butte, MD ED   05/17/2014  8:38 PM 05/30/2014  4:55 PM Full Code [id number]  Fritzi Mandes, MD Inpatient      TOTAL TIME TAKING CARE OF THIS PATIENT: 45 minutes.    Seretha Estabrooks Dawson 03/16/2015, 1:00 AM  [facility]  Office  [phone]  CC: Primary care physician; [name], NP

## 2015-03-15 NOTE — ED Notes (Signed)
Redness and fever at colostomy site. Sent in by dr cooper for admission per pt

## 2015-03-15 NOTE — ED Notes (Signed)
Double checked 2x that pt is not a direct admit. Pt does not want to go through the emergency room and "be charged". Wants me to stop triaging and get her a phone to call dr. Burt Knack.

## 2015-03-15 NOTE — ED Notes (Signed)
Patient transported to CT 

## 2015-03-15 NOTE — Progress Notes (Signed)
Outpatient Surgical Follow Up  03/15/2015  [name] is an 60 y.o. female.   CC fevers and abdominal pain  HPI: this patient was just discharged from the hospital with a diagnosis of pneumonia and possible DVT who has a known parastomal hernia. She has had good output from her ostomy but has had the occasional crampy abdominal pain that lasts 3-4 minutes and goes away completely in between episodes. She is had fevers at home and has a fever here in the office. Is had no nausea or vomiting but is starting to have chills as well.  Continues to smoke in spite of her severe COPD and recent pneumonia  Past Medical History  Diagnosis Date  . Schizophrenia ([place])   . Asthma   . GERD (gastroesophageal reflux disease)   . Anxiety   . Depression   . Bipolar disorder ([place])   . COPD (chronic obstructive pulmonary disease) ([place])   . Occasional tremors     right hand  . PTSD (post-traumatic stress disorder)   . Shortness of breath dyspnea   . Fibromyalgia   . DVT (deep venous thrombosis) ([place]) 2011    RUE  . Thyroid nodule   . DDD (degenerative disc disease), lumbar   . Spinal stenosis   . Peripheral neuropathy ([place])   . Rotator cuff tear     right  . Pneumonia 2011  . Hypothyroidism     no meds currently  . Anemia     during pregnancy only  . Hypertension     Off meds x 15 years-well controlled now per pt  . Squamous cell cancer, anus (H[facility])   . Severe obstructive sleep apnea 06/27/2014  . DVT of upper extremity (deep vein thrombosis) ([place]) 10/14/2014    Past Surgical History  Procedure Laterality Date  . Foot surgery Right   . Tubal ligation    . Eye surgery Bilateral   . Mouth surgery  2002  . Rectal biopsy N/A 05/08/2014    Procedure: BIOPSY RECTAL;  Surgeon: Marlyce Huge, MD;  Location: [facility];  Service: General;  Laterality: N/A;  . Evaluation under anesthesia with hemorrhoidectomy N/A 05/08/2014    Procedure: EXAM UNDER ANESTHESIA WITH HEMORRHOIDECTOMY;   Surgeon: Marlyce Huge, MD;  Location: [facility];  Service: General;  Laterality: N/A;  . Portacath placement N/A 05/20/2014    Procedure: INSERTION PORT-A-CATH;  Surgeon: Florene Glen, MD;  Location: [facility];  Service: General;  Laterality: N/A;  . Laparoscopic diverted colostomy N/A 05/26/2014    Procedure: LAPAROSCOPIC DIVERTED COLOSTOMY;  Surgeon: Marlyce Huge, MD;  Location: [facility];  Service: General;  Laterality: N/A;  . Peripheral vascular catheterization Left 10/16/2014    Procedure: Upper Extremity Venography with thrombectomy, port removal;  Surgeon: Algernon Huxley, MD;  Location: [place] [facility];  Service: Cardiovascular;  Laterality: Left;  . Peripheral vascular catheterization  10/16/2014    Procedure: Upper Extremity Intervention;  Surgeon: Algernon Huxley, MD;  Location: [place] [facility];  Service: Cardiovascular;;    Family History  Problem Relation Age of Onset  . Cancer Maternal Aunt   . Cancer Paternal Uncle   . Diabetes Mother   . Thyroid disease Mother   . Kidney failure Mother   . Hypertension Mother   . Depression Mother   . Mental illness Son   . Aneurysm Maternal Grandmother   . Thyroid disease Maternal Grandmother   . Stroke Maternal Grandfather   . Hypertension Maternal Grandfather   .  Diabetes Maternal Grandfather   . Heart disease Maternal Grandfather     MI  . Nephrolithiasis Daughter     Social History:  reports that she has been smoking Cigarettes.  She started smoking about 44 years ago. She has a 4.4 pack-year smoking history. She has never used smokeless tobacco. She reports that she uses illicit drugs (Marijuana). She reports that she does not drink alcohol.  Allergies:  Allergies  Allergen Reactions  . Sulfa Antibiotics Hives    Medications reviewed.   Review of Systems:   Review of Systems  Constitutional: Positive for fever, chills and malaise/fatigue. Negative for diaphoresis.  HENT: Negative.   Eyes:  Negative.   Respiratory: Positive for shortness of breath. Negative for cough, hemoptysis, sputum production and wheezing.   Cardiovascular: Positive for leg swelling. Negative for chest pain, palpitations and orthopnea.  Gastrointestinal: Positive for abdominal pain. Negative for heartburn, nausea, vomiting, diarrhea, constipation, blood in stool and melena.       Cramping abdominal pain lasting 3-4 minutes and then completely resolving in between cramps  Genitourinary: Negative.   Musculoskeletal: Negative.   Skin: Negative.   Neurological: Positive for weakness.  Endo/Heme/Allergies: Negative.   Psychiatric/Behavioral: Negative.      Physical Exam:  BP 119/76 mmHg  Pulse 101  Temp(Src) 102.3 F (39.1 C) (Oral)  Ht '5\' 6"'$  (1.676 m)  Wt 219 lb 9.6 oz (99.61 kg)  BMI 35.46 kg/m2  Physical Exam  Constitutional: She is oriented to person, place, and time. She appears distressed.  Appears quite ill and is tachycardic and febrile  HENT:  Head: Normocephalic and atraumatic.  Eyes: Pupils are equal, round, and reactive to light. Right eye exhibits no discharge. Left eye exhibits no discharge. No scleral icterus.  Neck: Normal range of motion.  Cardiovascular: Regular rhythm and normal heart sounds.   Tachycardic  Pulmonary/Chest: Effort normal. No respiratory distress. She has no wheezes. She has rales. She exhibits no tenderness.  Abdominal: Soft. She exhibits no distension. There is tenderness. There is no rebound and no guarding.  Functional ostomy without melena or hematochezia A stomal hernia which is minimally tender without peritoneal signs Diffuse tenderness in the abdomen without peritoneal signs Minimal faint erythema of the abdominal wall extending across the midline  Musculoskeletal: Normal range of motion. She exhibits edema. She exhibits no tenderness.  Massive lower extremity edema with pitting  Lymphadenopathy:    She has no cervical adenopathy.  Neurological: She  is alert and oriented to person, place, and time.  Skin: Skin is warm. No rash noted. She is diaphoretic. There is erythema. No pallor.  Psychiatric: Mood and affect normal.  Vitals reviewed.     No results found for this or any previous visit (from the past 48 hour(s)). No results found.  Assessment/Plan:   recent admission and discharge from the medical service for pneumonia and COPD.  This may be the etiology of her fevers as she has just finished a course of antibiotics and her fever started when she finished the antibiotics. She is being sent to the emergency room for evaluation and will likely require chest x-ray and possibly CT scan. I suspect she will require admission to the medical service again.   Parastomal hernia with intermittent abdominal pain. She does have some edema to her abdominal wall and some mild erythema which is somewhat diffuse across her abdomen of unclear etiology her abdomen is fairly soft and minimally tender without peritoneal signs ostomy function is good.  She also has considerable lower extremity edema and both legs which will need to be addressed. I will call the emergency room and discussed these findings with the charge nurse.  Florene Glen, MD, FACS

## 2015-03-15 NOTE — ED Notes (Addendum)
Assisted pt up to bathroom and back in to bed with steady gait.  Pt colostomy bag emptied.

## 2015-03-16 DIAGNOSIS — R1011 Right upper quadrant pain: Secondary | ICD-10-CM | POA: Insufficient documentation

## 2015-03-16 DIAGNOSIS — J189 Pneumonia, unspecified organism: Secondary | ICD-10-CM | POA: Insufficient documentation

## 2015-03-16 DIAGNOSIS — R509 Fever, unspecified: Secondary | ICD-10-CM

## 2015-03-16 LAB — CBC
HEMATOCRIT: 28.8 % — AB (ref 35.0–47.0)
HEMOGLOBIN: 9.5 g/dL — AB (ref 12.0–16.0)
MCH: 30.8 pg (ref 26.0–34.0)
MCHC: 33 g/dL (ref 32.0–36.0)
MCV: 93.2 fL (ref 80.0–100.0)
Platelets: 92 10*3/uL — ABNORMAL LOW (ref 150–440)
RBC: 3.09 MIL/uL — AB (ref 3.80–5.20)
RDW: 16.5 % — AB (ref 11.5–14.5)
WBC: 7.8 10*3/uL (ref 3.6–11.0)

## 2015-03-16 LAB — BASIC METABOLIC PANEL
ANION GAP: 4 — AB (ref 5–15)
BUN: 14 mg/dL (ref 6–20)
CHLORIDE: 105 mmol/L (ref 101–111)
CO2: 26 mmol/L (ref 22–32)
Calcium: 7.9 mg/dL — ABNORMAL LOW (ref 8.9–10.3)
Creatinine, Ser: 1.06 mg/dL — ABNORMAL HIGH (ref 0.44–1.00)
GFR, EST NON AFRICAN AMERICAN: 56 mL/min — AB (ref >60)
Glucose, Bld: 119 mg/dL — ABNORMAL HIGH (ref 65–99)
POTASSIUM: 4 mmol/L (ref 3.5–5.1)
SODIUM: 135 mmol/L (ref 135–145)

## 2015-03-16 LAB — LACTIC ACID, PLASMA: LACTIC ACID, VENOUS: 0.8 mmol/L (ref 0.5–2.0)

## 2015-03-16 MED ORDER — CLONAZEPAM 1 MG PO TABS: 1.0000 mg | ORAL_TABLET | Freq: Three times a day (TID) | ORAL | Status: DC | PRN

## 2015-03-16 MED ORDER — ACETAMINOPHEN 650 MG RE SUPP
650.0000 mg | Freq: Four times a day (QID) | RECTAL | Status: DC | PRN
Start: 2015-03-16 — End: 2015-03-18

## 2015-03-16 MED ORDER — ESCITALOPRAM OXALATE 10 MG PO TABS
20.0000 mg | ORAL_TABLET | Freq: Every day | ORAL | Status: DC
  Administered 2015-03-16 – 2015-03-18 (×3): 20 mg via ORAL
  Filled 2015-03-16 (×3): qty 2

## 2015-03-16 MED ORDER — NICOTINE 21 MG/24HR TD PT24
21.0000 mg | MEDICATED_PATCH | Freq: Every day | TRANSDERMAL | Status: DC
  Administered 2015-03-16 – 2015-03-18 (×3): 21 mg via TRANSDERMAL
  Filled 2015-03-16 (×3): qty 1

## 2015-03-16 MED ORDER — VANCOMYCIN HCL IN DEXTROSE 1-5 GM/200ML-% IV SOLN
1000.0000 mg | Freq: Two times a day (BID) | INTRAVENOUS | Status: DC
  Administered 2015-03-16 – 2015-03-17 (×3): 1000 mg via INTRAVENOUS
  Filled 2015-03-16 (×4): qty 200

## 2015-03-16 MED ORDER — OXYCODONE-ACETAMINOPHEN 10-325 MG PO TABS: 1.0000 | ORAL_TABLET | Freq: Four times a day (QID) | ORAL | Status: DC | PRN

## 2015-03-16 MED ORDER — PIPERACILLIN-TAZOBACTAM 4.5 G IVPB
4.5000 g | Freq: Three times a day (TID) | INTRAVENOUS | Status: DC
  Administered 2015-03-16 – 2015-03-17 (×4): 4.5 g via INTRAVENOUS
  Filled 2015-03-16 (×6): qty 100

## 2015-03-16 MED ORDER — METHYLPREDNISOLONE SODIUM SUCC 125 MG IJ SOLR
60.0000 mg | Freq: Four times a day (QID) | INTRAMUSCULAR | Status: DC
  Administered 2015-03-16 – 2015-03-17 (×6): 60 mg via INTRAVENOUS
  Filled 2015-03-16 (×7): qty 2

## 2015-03-16 MED ORDER — IPRATROPIUM-ALBUTEROL 0.5-2.5 (3) MG/3ML IN SOLN: 3.0000 mL | RESPIRATORY_TRACT | Status: DC | PRN

## 2015-03-16 MED ORDER — ONDANSETRON HCL 4 MG/2ML IJ SOLN: 4.0000 mg | Freq: Four times a day (QID) | INTRAMUSCULAR | Status: DC | PRN

## 2015-03-16 MED ORDER — MOMETASONE FURO-FORMOTEROL FUM 200-5 MCG/ACT IN AERO
2.0000 | INHALATION_SPRAY | Freq: Two times a day (BID) | RESPIRATORY_TRACT | Status: DC
  Administered 2015-03-16 – 2015-03-18 (×5): 2 via RESPIRATORY_TRACT
  Filled 2015-03-16: qty 8.8

## 2015-03-16 MED ORDER — BACLOFEN 10 MG PO TABS
10.0000 mg | ORAL_TABLET | Freq: Three times a day (TID) | ORAL | Status: DC
  Administered 2015-03-16 – 2015-03-18 (×7): 10 mg via ORAL
  Filled 2015-03-16 (×7): qty 1

## 2015-03-16 MED ORDER — OXYCODONE HCL 5 MG PO TABS
5.0000 mg | ORAL_TABLET | Freq: Four times a day (QID) | ORAL | Status: DC | PRN
Start: 2015-03-16 — End: 2015-03-17
  Administered 2015-03-17: 5 mg via ORAL
  Filled 2015-03-16: qty 1

## 2015-03-16 MED ORDER — FUROSEMIDE 20 MG PO TABS
20.0000 mg | ORAL_TABLET | Freq: Every day | ORAL | Status: DC
  Administered 2015-03-16 – 2015-03-18 (×3): 20 mg via ORAL
  Filled 2015-03-16 (×3): qty 1

## 2015-03-16 MED ORDER — PANTOPRAZOLE SODIUM 40 MG PO TBEC
40.0000 mg | DELAYED_RELEASE_TABLET | Freq: Two times a day (BID) | ORAL | Status: DC
  Administered 2015-03-16 – 2015-03-18 (×6): 40 mg via ORAL
  Filled 2015-03-16 (×6): qty 1

## 2015-03-16 MED ORDER — ARIPIPRAZOLE 2 MG PO TABS
2.0000 mg | ORAL_TABLET | Freq: Every day | ORAL | Status: DC
  Administered 2015-03-16 – 2015-03-18 (×3): 2 mg via ORAL
  Filled 2015-03-16 (×3): qty 1

## 2015-03-16 MED ORDER — ENOXAPARIN SODIUM 40 MG/0.4ML ~~LOC~~ SOLN
40.0000 mg | SUBCUTANEOUS | Status: DC
Start: 2015-03-16 — End: 2015-03-18
  Administered 2015-03-16 – 2015-03-17 (×2): 40 mg via SUBCUTANEOUS
  Filled 2015-03-16 (×2): qty 0.4

## 2015-03-16 MED ORDER — SODIUM CHLORIDE 0.9% FLUSH
3.0000 mL | Freq: Two times a day (BID) | INTRAVENOUS | Status: DC
  Administered 2015-03-16 – 2015-03-18 (×6): 3 mL via INTRAVENOUS

## 2015-03-16 MED ORDER — ONDANSETRON HCL 4 MG PO TABS: 4.0000 mg | ORAL_TABLET | Freq: Four times a day (QID) | ORAL | Status: DC | PRN

## 2015-03-16 MED ORDER — TIOTROPIUM BROMIDE MONOHYDRATE 18 MCG IN CAPS
18.0000 ug | ORAL_CAPSULE | Freq: Every day | RESPIRATORY_TRACT | Status: DC
  Administered 2015-03-16 – 2015-03-17 (×2): 18 ug via RESPIRATORY_TRACT
  Filled 2015-03-16: qty 5

## 2015-03-16 MED ORDER — ACETAMINOPHEN 325 MG PO TABS
650.0000 mg | ORAL_TABLET | Freq: Four times a day (QID) | ORAL | Status: DC | PRN
Start: 2015-03-16 — End: 2015-03-18

## 2015-03-16 MED ORDER — DULOXETINE HCL 60 MG PO CPEP
60.0000 mg | ORAL_CAPSULE | Freq: Every day | ORAL | Status: DC
  Administered 2015-03-16 – 2015-03-17 (×3): 60 mg via ORAL
  Filled 2015-03-16 (×3): qty 1

## 2015-03-16 MED ORDER — OXYCODONE-ACETAMINOPHEN 5-325 MG PO TABS
1.0000 | ORAL_TABLET | Freq: Four times a day (QID) | ORAL | Status: DC | PRN
  Administered 2015-03-16 – 2015-03-17 (×4): 1 via ORAL
  Filled 2015-03-16 (×4): qty 1

## 2015-03-16 NOTE — Progress Notes (Signed)
Pharmacy Antibiotic Note  [name] is a 60 y.o. female admitted on 03/15/2015 with pneumonia/?HCAP.  Pharmacy has been consulted for vancomycin and Zosyn dosing.  Plan: DW 77kg  Vd 54L kei 0.058 hr-1  t1/2 12 hours Vancomycin 1 gram q 12 hours ordered with stacked dosing. Level before 5th dose. Goal 15-20.  Zosyn 4.5 grams q 8 hours ordered for Pseudomonas risk of abx received during recent admission (Levaquin.)  Patient's 3rd hospital admission since 02/26/15.  Height: 5' 6.5" (168.9 cm) Weight: 221 lb (100.245 kg) IBW/kg (Calculated) : 60.45  Temp (24hrs), Avg:99 F (37.2 C), Min:97.6 F (36.4 C), Max:101.1 F (38.4 C)   Recent Labs Lab 03/11/15 1904 03/15/15 1558 03/16/15 0123 03/16/15 0522  WBC 8.0 9.7  --  7.8  CREATININE 1.07* 1.13*  --  1.06*  LATICACIDVEN  --   --  0.8  --     Estimated Creatinine Clearance: 68.9 mL/min (by C-G formula based on Cr of 1.06).    Allergies  Allergen Reactions  . Sulfa Antibiotics Hives    Antimicrobials this admission: Vanc  3/9 >>   Zosyn  3/9 >>   Dose adjustments this admission:   Microbiology results: 3/9 BCx: pending 3/9 UCx: pending  3/9  Sputum: pending   UA: (-) CXR: R base opacity  Thank you for allowing pharmacy to be a part of this patient's care.  Stephenia Vogan A 03/16/2015 2:45 PM

## 2015-03-16 NOTE — Progress Notes (Signed)
Patient ID: [name], female   DOB: [birth year], 60 y.o.   MRN: [id number] [facility] NAME: [name]    MR#:  [id number]  DATE OF BIRTH:  03-28-1955  SUBJECTIVE:  Came in with fever, cough,sob and redness over the abdomen  REVIEW OF SYSTEMS:   Review of Systems  Constitutional: Positive for fever. Negative for chills and diaphoresis.  HENT: Negative for congestion, ear pain, hearing loss, nosebleeds and sore throat.   Eyes: Negative for blurred vision, double vision, photophobia and pain.  Respiratory: Positive for cough and shortness of breath. Negative for hemoptysis, sputum production, wheezing and stridor.   Cardiovascular: Negative for orthopnea, claudication and leg swelling.  Gastrointestinal: Positive for abdominal pain. Negative for heartburn.  Genitourinary: Negative for dysuria and frequency.  Musculoskeletal: Negative for back pain, joint pain and neck pain.  Skin: Negative for rash.  Neurological: Positive for weakness. Negative for tingling, sensory change, speech change, focal weakness, seizures and headaches.  Endo/Heme/Allergies: Does not bruise/bleed easily.  Psychiatric/Behavioral: Negative for suicidal ideas, memory loss and substance abuse. The patient is not nervous/anxious.   All other systems reviewed and are negative.  Tolerating Diet:yes Tolerating PT: pending  DRUG ALLERGIES:   Allergies  Allergen Reactions  . Sulfa Antibiotics Hives    VITALS:  Blood pressure 113/58, pulse 71, temperature 97.7 F (36.5 C), temperature source Oral, resp. rate 28, height 5' 6.5" (1.689 m), weight 100.245 kg (221 lb), SpO2 84 %.  PHYSICAL EXAMINATION:   Physical Exam  GENERAL:  60 y.o.-year-old patient lying in the bed with no acute distress.  EYES: Pupils equal, round, reactive to light and accommodation. No scleral icterus. Extraocular muscles intact.  HEENT: Head atraumatic,  normocephalic. Oropharynx and nasopharynx clear.  NECK:  Supple, no jugular venous distention. No thyroid enlargement, no tenderness.  LUNGS: Normal breath sounds bilaterally, no wheezing, rales, rhonchi. No use of accessory muscles of respiration.  CARDIOVASCULAR: S1, S2 normal. No murmurs, rubs, or gallops.  ABDOMEN: Soft, nontender, nondistended. Bowel sounds present. No organomegaly or mass. Mild redness around colostomy area EXTREMITIES: No cyanosis, clubbing or edema b/l.    NEUROLOGIC: Cranial nerves II through XII are intact. No focal Motor or sensory deficits b/l.   PSYCHIATRIC:  patient is alert and oriented x 3.  SKIN: No obvious rash, lesion, or ulcer.   LABORATORY PANEL:  CBC  Recent Labs Lab 03/16/15 0522  WBC 7.8  HGB 9.5*  HCT 28.8*  PLT 92*    Chemistries   Recent Labs Lab 03/15/15 1558 03/16/15 0522  NA 136 135  K 4.3 4.0  CL 106 105  CO2 25 26  GLUCOSE 92 119*  BUN 15 14  CREATININE 1.13* 1.06*  CALCIUM 8.1* 7.9*  AST 27  --   ALT 14  --   ALKPHOS 64  --   BILITOT 1.0  --    Cardiac Enzymes No results for input(s): TROPONINI in the last 168 hours. RADIOLOGY:  Dg Chest 2 View  03/15/2015  CLINICAL DATA:  Fever, back pain, abdominal pain. EXAM: CHEST  2 VIEW COMPARISON:  Chest x-ray and chest CT dated 03/05/2015. Comparison is also made to chest x-ray dated 07/24/2014. FINDINGS: There is a dense opacity at the right lung base, pneumonia versus atelectasis. Left lung is clear. Heart size is normal. Overall cardiomediastinal silhouette is stable in size and configuration. Severe compression fracture deformities are again noted within the lower thoracic spine, stable.  No acute- appearing osseous abnormality. IMPRESSION: 1. Dense opacity at the right lung base, posteriorly based on the lateral projection. This could represent atelectasis or pneumonia. A similar consolidation was seen at the right lung base on the recent chest CT of 03/05/2015, also suspected  to be pneumonia at that time. 2. Chronic compression fracture deformities within the lower thoracic spine. Electronically Signed   By: Franki Cabot M.D.   On: 03/15/2015 17:47   Ct Abdomen Pelvis W Contrast  03/15/2015  CLINICAL DATA:  Redness and fever at colostomy site. Abdominal pain and fever. Colostomy because of rectal cancer. EXAM: CT ABDOMEN AND PELVIS WITH CONTRAST TECHNIQUE: Multidetector CT imaging of the abdomen and pelvis was performed using the standard protocol following bolus administration of intravenous contrast. CONTRAST:  147m OMNIPAQUE IOHEXOL 300 MG/ML  SOLN COMPARISON:  03/02/2015 and 02/27/2015 FINDINGS: Lower chest: Nodular pleural thickening on the right along the loculated pleural effusion, suspicious for malignant involvement of the pleural space on the right. Adjacent passive atelectasis noted. Hepatobiliary: Contracted gallbladder. Pancreas: Unremarkable Spleen: Unremarkable Adrenals/Urinary Tract: Unremarkable Stomach/Bowel: Scattered air- fluid levels in mildly thickened loops of ileum in the pelvis. Jejunal folds unremarkable. No dilated bowel. Left lower quadrant ostomy contains a loop of sigmoid colon possibly with a side anastomosis for the ostomy given the efferent in the efferent loops. Vascular/Lymphatic: Aortoiliac atherosclerotic vascular disease. Infrarenal abdominal aortic aneurysm 4.5 by 4.0 cm, image 48 series 2. No pathologic adenopathy. Reproductive: Unremarkable Other: Considerable ascites. Scattered mesenteric edema. Considerable subcutaneous edema. There is ascites in the peristomal hernia which also contains adipose tissue from the omentum. I do not see obvious caking of tumor along the ascites or omentum to further suggest peritoneal spread of tumor. Musculoskeletal: We again observed compression fractures at T9, T10, and T12, with the T10 and T12 vertebral fractures demonstrating severe loss of height. IMPRESSION: 1. The ostomy site appears relatively similar  to prior, likely a side ostomy site involving the sigmoid colon given the presence of a fair it in the efferent loops, and containing some of the ascites from the abdomen. There is increase in subcutaneous edema diffusely attributed to third spacing of fluid, and underlying ascites is similar to prior. Much of this is likely attributable to the patient's hypoproteinemia and hypoalbuminemia. 2. The loculated right basilar pleural effusion has nodular it densities along the pleural margin concerning for possible pleural tumor. 3. There is some thickened loops of jejunum with scattered air- fluid levels possibly from ileus or inflammation. However, some of this appearance may also be due to the patient's hypoproteinemia and hypoalbuminemia. 4. Infrarenal abdominal aortic aneurysm, 4.0 by 4.5 cm. Recommend followup by abdomen and pelvis CTA in 6 months, and vascular surgery referral/consultation if not already obtained. This recommendation follows ACR consensus guidelines: White Paper of the ACR Incidental Findings Committee II on Vascular Findings. J Am Coll Radiol 2013; 10:789-794. 5. Stable compression fractures at T9, T10, and T12. Electronically Signed   By: WVan ClinesM.D.   On: 03/15/2015 19:40   ASSESSMENT AND PLAN:  *AGalit Urichis a 60y.o. female who presents with fever, dyspnea. Patient was recently admitted here with pneumonia. Shortly prior that she had abdominal surgery with colostomy. She was in her surgeon's office for follow-up visit today and was stating that she felt like she may be developing something of a cellulitis. She also mentioned that she has been increasingly more dyspneic recently  1. Sepsis ([place] - broad spectrum antibiotics Vanc and zosyn -f/u  blood Cultures  - Patient is hemodynamically stable -IVF.  2. COPD exacerbation ([place]) - likely due to her pneumonia, will use IV steroids in addition to antibiotics. When necessary nebs.  3.HCAP (healthcare-associated  pneumonia) - antibiotics, cultures, nebs, when necessary antitussive -CT chest shows possible loculated right effusion with ?pleural mass.  -Pulmonary consultation  4. Anxiety - home dose anxiolytics  5. HTN (hypertension) - stable, continue home meds  6.Severe obstructive sleep apnea - BiPAP daily at bedtime  7. HLD (hyperlipidemia) - continue home meds   Case discussed with Care Management/Social Worker. Management plans discussed with the patient, family and they are in agreement.  CODE STATUS: full  DVT Prophylaxis: loveonx  TOTAL TIME TAKING CARE OF THIS PATIENT: 35 minutes.  >50% time spent on counselling and coordination of care  POSSIBLE D/C IN *1-2 DAYS, DEPENDING ON CLINICAL CONDITION.  Note: This dictation was prepared with Dragon dictation along with smaller phrase technology. Any transcriptional errors that result from this process are unintentional.  [name] M.D on 03/16/2015 at 5:32 PM  Between 7am to 6pm - Pager - [phone]  After 6pm go to [email or web address] - password [email or web address] [facility]  Office  [phone]  CC: Primary care physician; [name], NP

## 2015-03-16 NOTE — Progress Notes (Signed)
Pharmacy Antibiotic Note  [name] is a 60 y.o. female admitted on 03/15/2015 with pneumonia.  Pharmacy has been consulted for vancomycin and Zosyn dosing.  Plan: DW 77kg  Vd 54L kei 0.058 hr-1  t1/2 12 hours Vancomycin 1 gram q 12 hours ordered with stacked dosing. Level before 5th dose. Goal 15-20.  Zosyn 4.5 grams q 8 hours ordered for Pseudomonas risk of abx received during recent admission (Levaquin.)  Height: 5' 6.5" (168.9 cm) Weight: 221 lb (100.245 kg) IBW/kg (Calculated) : 60.45  Temp (24hrs), Avg:100 F (37.8 C), Min:98.4 F (36.9 C), Max:102.3 F (39.1 C)   Recent Labs Lab 03/11/15 1904 03/15/15 1558  WBC 8.0 9.7  CREATININE 1.07* 1.13*    Estimated Creatinine Clearance: 64.7 mL/min (by C-G formula based on Cr of 1.13).    Allergies  Allergen Reactions  . Sulfa Antibiotics Hives    Antimicrobials this admission:   >>    >>   Dose adjustments this admission:   Microbiology results: 3/9 BCx: pending 3/9 UCx: pending  3/9  Sputum: pending   UA: (-) CXR: R base opacity  Thank you for allowing pharmacy to be a part of this patient's care.  Jonta Gastineau S 03/16/2015 1:55 AM

## 2015-03-16 NOTE — Progress Notes (Signed)
60 yr old female with pneumonia.  Patient also has a parastomal hernia. Patient has a functioning ostomy as well.  Still some mild intermittent pains in RUQ and chest pain.   Filed Vitals:   03/16/15 0914 03/16/15 1200  BP: 111/60 113/58  Pulse:  71  Temp: 97.6 F (36.4 C) 97.7 F (36.5 C)  Resp:  28   PE:  Gen; NAD Res: dimished on right Cardio: RRR Abd: soft minmal tenderness in RUQ, no erythema on abdomen per my exam, functional ostomy Ext: 2+ pulses, no edema  CBC Latest Ref Rng 03/16/2015 03/15/2015 03/11/2015  WBC 3.6 - 11.0 K/uL 7.8 9.7 8.0  Hemoglobin 12.0 - 16.0 g/dL 9.5(L) 10.6(L) 10.2(L)  Hematocrit 35.0 - 47.0 % 28.8(L) 31.8(L) 30.5(L)  Platelets 150 - 440 K/uL 92(L) 103(L) 125(L)   CMP Latest Ref Rng 03/16/2015 03/15/2015 03/11/2015  Glucose 65 - 99 mg/dL 119(H) 92 111(H)  BUN 6 - 20 mg/dL '14 15 15  '$ Creatinine 0.44 - 1.00 mg/dL 1.06(H) 1.13(H) 1.07(H)  Sodium 135 - 145 mmol/L 135 136 142  Potassium 3.5 - 5.1 mmol/L 4.0 4.3 4.4  Chloride 101 - 111 mmol/L 105 106 108  CO2 22 - 32 mmol/L '26 25 28  '$ Calcium 8.9 - 10.3 mg/dL 7.9(L) 8.1(L) 8.2(L)  Total Protein 6.5 - 8.1 g/dL - 5.9(L) -  Total Bilirubin 0.3 - 1.2 mg/dL - 1.0 -  Alkaline Phos 38 - 126 U/L - 64 -  AST 15 - 41 U/L - 27 -  ALT 14 - 54 U/L - 14 -    A/P:  60 yr old female with pneumonia which is likely the cause of her fever, malasie and RUQ/Right chest pain.  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

## 2015-03-16 NOTE — ED Notes (Signed)
Hospitalist at bedside 

## 2015-03-17 DIAGNOSIS — J90 Pleural effusion, not elsewhere classified: Secondary | ICD-10-CM

## 2015-03-17 DIAGNOSIS — J441 Chronic obstructive pulmonary disease with (acute) exacerbation: Secondary | ICD-10-CM

## 2015-03-17 MED ORDER — DIPHENHYDRAMINE HCL 12.5 MG/5ML PO ELIX
12.5000 mg | ORAL_SOLUTION | Freq: Four times a day (QID) | ORAL | Status: DC | PRN
  Filled 2015-03-17: qty 5

## 2015-03-17 MED ORDER — METHYLPREDNISOLONE SODIUM SUCC 40 MG IJ SOLR: 40.0000 mg | Freq: Two times a day (BID) | INTRAMUSCULAR | Status: DC

## 2015-03-17 MED ORDER — IPRATROPIUM-ALBUTEROL 0.5-2.5 (3) MG/3ML IN SOLN
3.0000 mL | RESPIRATORY_TRACT | Status: DC | PRN
  Administered 2015-03-17 – 2015-03-18 (×4): 3 mL via RESPIRATORY_TRACT
  Filled 2015-03-17 (×5): qty 3

## 2015-03-17 MED ORDER — DIPHENHYDRAMINE HCL 50 MG/ML IJ SOLN
12.5000 mg | Freq: Four times a day (QID) | INTRAMUSCULAR | Status: DC | PRN
  Administered 2015-03-17: 12.5 mg via INTRAVENOUS
  Filled 2015-03-17: qty 1

## 2015-03-17 MED ORDER — OXYCODONE-ACETAMINOPHEN 5-325 MG PO TABS
1.0000 | ORAL_TABLET | Freq: Four times a day (QID) | ORAL | Status: DC | PRN
  Administered 2015-03-17 (×2): 1 via ORAL
  Filled 2015-03-17 (×2): qty 1

## 2015-03-17 MED ORDER — TIOTROPIUM BROMIDE MONOHYDRATE 18 MCG IN CAPS
18.0000 ug | ORAL_CAPSULE | Freq: Every day | RESPIRATORY_TRACT | Status: DC
  Administered 2015-03-18: 08:00:00 18 ug via RESPIRATORY_TRACT
  Filled 2015-03-17: qty 5

## 2015-03-17 MED ORDER — IPRATROPIUM-ALBUTEROL 0.5-2.5 (3) MG/3ML IN SOLN
3.0000 mL | Freq: Four times a day (QID) | RESPIRATORY_TRACT | Status: DC
  Administered 2015-03-17: 09:00:00 3 mL via RESPIRATORY_TRACT
  Filled 2015-03-17: qty 3

## 2015-03-17 MED ORDER — PIPERACILLIN-TAZOBACTAM 3.375 G IVPB
3.3750 g | Freq: Three times a day (TID) | INTRAVENOUS | Status: DC
  Administered 2015-03-17 – 2015-03-18 (×3): 3.375 g via INTRAVENOUS
  Filled 2015-03-17 (×4): qty 50

## 2015-03-17 MED ORDER — TRAZODONE HCL 50 MG PO TABS
150.0000 mg | ORAL_TABLET | Freq: Every day | ORAL | Status: DC
  Administered 2015-03-17: 22:00:00 150 mg via ORAL
  Filled 2015-03-17: qty 1

## 2015-03-17 MED ORDER — OXYCODONE HCL 5 MG PO TABS
5.0000 mg | ORAL_TABLET | Freq: Four times a day (QID) | ORAL | Status: DC | PRN
  Administered 2015-03-17 (×2): 5 mg via ORAL
  Filled 2015-03-17 (×2): qty 1

## 2015-03-17 NOTE — Progress Notes (Addendum)
Patient ID: [name], female   DOB: Apr 06, 1955, 60 y.o.   MRN: [id number] [facility] [place] NAME: [name]    MR#:  [id number]  DATE OF BIRTH:  [birth year]  SUBJECTIVE:   Pedal edema and abdominal wall cellulitis much better today Still some wheezing on exam, pulm consult pending for the loculated pleural effusion  REVIEW OF SYSTEMS:   Review of Systems  Constitutional: Negative for fever, chills and diaphoresis.  HENT: Negative for congestion, ear pain, hearing loss and nosebleeds.   Eyes: Negative for blurred vision, double vision, photophobia and pain.  Respiratory: Positive for cough. Negative for hemoptysis, sputum production, shortness of breath and wheezing.   Cardiovascular: Positive for leg swelling. Negative for chest pain, orthopnea and claudication.  Gastrointestinal: Negative for heartburn, nausea, vomiting and abdominal pain.  Genitourinary: Negative for dysuria and frequency.  Musculoskeletal: Negative for back pain and neck pain.  Skin: Negative for rash.  Neurological: Positive for weakness. Negative for dizziness, tingling, sensory change, speech change and headaches.  Endo/Heme/Allergies: Does not bruise/bleed easily.  Psychiatric/Behavioral: Negative for suicidal ideas and memory loss. The patient is not nervous/anxious.   All other systems reviewed and are negative.  Tolerating Diet:yes Tolerating PT: pending  DRUG ALLERGIES:   Allergies  Allergen Reactions  . Sulfa Antibiotics Hives    VITALS:  Blood pressure 106/50, pulse 62, temperature 97.7 F (36.5 C), temperature source Oral, resp. rate 20, height 5' 6.5" (1.689 m), weight 100.245 kg (221 lb), SpO2 96 %.  PHYSICAL EXAMINATION:   Physical Exam  GENERAL:  60 y.o.-year-old patient lying in the bed with no acute distress.  EYES: Pupils equal, round, reactive to light and accommodation. No scleral icterus. Extraocular muscles  intact.  HEENT: Head atraumatic, normocephalic. Oropharynx and nasopharynx clear.  NECK:  Supple, no jugular venous distention. No thyroid enlargement, no tenderness.  LUNGS: diffuse scattered expiratory wheezing, No rales, rhonchi. No use of accessory muscles of respiration.  CARDIOVASCULAR: S1, S2 normal. No murmurs, rubs, or gallops.  ABDOMEN: Soft, nontender, nondistended. Bowel sounds present. No organomegaly or mass.  LLQ colostomy bag with stool in it, no erythema of the abd wall noted. EXTREMITIES: No cyanosis, clubbing.   1+ bilateral pedal edema noted NEUROLOGIC: Cranial nerves II through XII are intact. No focal Motor or sensory deficits b/l.   PSYCHIATRIC:  patient is alert and oriented x 3.  SKIN: No obvious rash, lesion, or ulcer.   LABORATORY PANEL:  CBC  Recent Labs Lab 03/16/15 0522  WBC 7.8  HGB 9.5*  HCT 28.8*  PLT 92*    Chemistries   Recent Labs Lab 03/15/15 1558 03/16/15 0522  NA 136 135  K 4.3 4.0  CL 106 105  CO2 25 26  GLUCOSE 92 119*  BUN 15 14  CREATININE 1.13* 1.06*  CALCIUM 8.1* 7.9*  AST 27  --   ALT 14  --   ALKPHOS 64  --   BILITOT 1.0  --    Cardiac Enzymes No results for input(s): TROPONINI in the last 168 hours. RADIOLOGY:  Dg Chest 2 View  03/15/2015  CLINICAL DATA:  Fever, back pain, abdominal pain. EXAM: CHEST  2 VIEW COMPARISON:  Chest x-ray and chest CT dated 03/05/2015. Comparison is also made to chest x-ray dated 07/24/2014. FINDINGS: There is a dense opacity at the right lung base, pneumonia versus atelectasis. Left lung is clear. Heart size is normal. Overall cardiomediastinal silhouette is stable in  size and configuration. Severe compression fracture deformities are again noted within the lower thoracic spine, stable. No acute- appearing osseous abnormality. IMPRESSION: 1. Dense opacity at the right lung base, posteriorly based on the lateral projection. This could represent atelectasis or pneumonia. A similar consolidation  was seen at the right lung base on the recent chest CT of 03/05/2015, also suspected to be pneumonia at that time. 2. Chronic compression fracture deformities within the lower thoracic spine. Electronically Signed   By: Franki Cabot M.D.   On: 03/15/2015 17:47   Ct Abdomen Pelvis W Contrast  03/15/2015  CLINICAL DATA:  Redness and fever at colostomy site. Abdominal pain and fever. Colostomy because of rectal cancer. EXAM: CT ABDOMEN AND PELVIS WITH CONTRAST TECHNIQUE: Multidetector CT imaging of the abdomen and pelvis was performed using the standard protocol following bolus administration of intravenous contrast. CONTRAST:  157m OMNIPAQUE IOHEXOL 300 MG/ML  SOLN COMPARISON:  03/02/2015 and 02/27/2015 FINDINGS: Lower chest: Nodular pleural thickening on the right along the loculated pleural effusion, suspicious for malignant involvement of the pleural space on the right. Adjacent passive atelectasis noted. Hepatobiliary: Contracted gallbladder. Pancreas: Unremarkable Spleen: Unremarkable Adrenals/Urinary Tract: Unremarkable Stomach/Bowel: Scattered air- fluid levels in mildly thickened loops of ileum in the pelvis. Jejunal folds unremarkable. No dilated bowel. Left lower quadrant ostomy contains a loop of sigmoid colon possibly with a side anastomosis for the ostomy given the efferent in the efferent loops. Vascular/Lymphatic: Aortoiliac atherosclerotic vascular disease. Infrarenal abdominal aortic aneurysm 4.5 by 4.0 cm, image 48 series 2. No pathologic adenopathy. Reproductive: Unremarkable Other: Considerable ascites. Scattered mesenteric edema. Considerable subcutaneous edema. There is ascites in the peristomal hernia which also contains adipose tissue from the omentum. I do not see obvious caking of tumor along the ascites or omentum to further suggest peritoneal spread of tumor. Musculoskeletal: We again observed compression fractures at T9, T10, and T12, with the T10 and T12 vertebral fractures  demonstrating severe loss of height. IMPRESSION: 1. The ostomy site appears relatively similar to prior, likely a side ostomy site involving the sigmoid colon given the presence of a fair it in the efferent loops, and containing some of the ascites from the abdomen. There is increase in subcutaneous edema diffusely attributed to third spacing of fluid, and underlying ascites is similar to prior. Much of this is likely attributable to the patient's hypoproteinemia and hypoalbuminemia. 2. The loculated right basilar pleural effusion has nodular it densities along the pleural margin concerning for possible pleural tumor. 3. There is some thickened loops of jejunum with scattered air- fluid levels possibly from ileus or inflammation. However, some of this appearance may also be due to the patient's hypoproteinemia and hypoalbuminemia. 4. Infrarenal abdominal aortic aneurysm, 4.0 by 4.5 cm. Recommend followup by abdomen and pelvis CTA in 6 months, and vascular surgery referral/consultation if not already obtained. This recommendation follows ACR consensus guidelines: White Paper of the ACR Incidental Findings Committee II on Vascular Findings. J Am Coll Radiol 2013; 10:789-794. 5. Stable compression fractures at T9, T10, and T12. Electronically Signed   By: WVan ClinesM.D.   On: 03/15/2015 19:40   ASSESSMENT AND PLAN:  *ALayan Zalenskiis a 60y.o. female who presents with fever, dyspnea. Patient was recently admitted here with pneumonia. Shortly prior that she had abdominal surgery with colostomy. She was in her surgeon's office for follow-up visit today and was stating that she felt like she may be developing something of a cellulitis. She also mentioned that she has been  increasingly more dyspneic recently  1. Sepsis ([place]) - secondary to RLL pneumonia- recurrent and also abd wall cellulitis - Improving, cont  zosyn for now. Discontinue vancomycin as blood cultures are negative - Patient is  hemodynamically stable -Improving symptoms  2. COPD exacerbation ([place]) - likely due to her pneumonia, cont steroids, add nebs - ongoing smoking- counseled. Cont nicotine patch  3.HCAP (healthcare-associated pneumonia) - RLL with possible loculated right pleural effusion - cont antibiotics,  -CT chest shows possible loculated right effusion with ?pleural based mass.  -Pulmonary consultation for the same today-  4. Anxiety - home dose anxiolytics  5. HTN (hypertension) - stable, continue home meds  6. obstructive sleep apnea - couldn't tolerate cpap  7. 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 - patient's oncologist Dr. Mike Gip has discussed with Dr. dew and they both were agreeable to discontinue Coumadin as the patient's clot is completely dissolved. So only on prophylaxis now.   Case discussed with Care Management/Social Worker. Management plans discussed with the patient, family and they are in agreement.  CODE STATUS: full  DVT Prophylaxis: on loveonx  TOTAL TIME TAKING CARE OF THIS PATIENT: 35 minutes.  >50% time spent on counselling and coordination of care  POSSIBLE D/C IN 1-2 DAYS, DEPENDING ON CLINICAL CONDITION.  Note: This dictation was prepared with Dragon dictation along with smaller phrase technology. Any transcriptional errors that result from this process are unintentional.  Mylissa Lambe M.D on 03/17/2015 at 8:52 AM  Between 7am to 6pm - Pager - [phone]  After 6pm go to [email or web address] - password [email or web address] [facility]  Office  [phone]  CC: Primary care physician; [name], NP

## 2015-03-17 NOTE — Progress Notes (Signed)
PHARMACIST - PHYSICIAN COMMUNICATION  CONCERNING: IV to Oral Route Change Policy  RECOMMENDATION: This patient is receiving DIPHENHYDRAMINE by the intravenous route.  Based on criteria approved by the Pharmacy and Therapeutics Committee, the intravenous medication(s) is/are being converted to the equivalent oral dose form(s).   DESCRIPTION: These criteria include:  The patient is eating (either orally or via tube) and/or has been taking other orally administered medications for a least 24 hours  The patient has no evidence of active gastrointestinal bleeding or impaired GI absorption (gastrectomy, short bowel, patient on TNA or NPO).  If you have questions about this conversion, please contact the Pharmacy Department  '[]'$   [phone] )  [facility] '[x]'$   [phone] )  [facility] '[]'$   [phone] )  [facility] '[]'$   [phone] )  [facility] '[]'$   [phone] )  [place], [facility] 03/17/2015 11:06 AM

## 2015-03-17 NOTE — Progress Notes (Signed)
Pharmacy Antibiotic Note  [name] is a 60 y.o. female admitted on 03/15/2015 with pneumonia/?HCAP.  Pharmacy has been consulted for vancomycin and Zosyn dosing. Patient's 3rd hospital admission since 02/26/15. Vancomycin d/c on 3/11.  Plan: Zosyn 4.5 grams q 8 hours ordered for Pseudomonas risk of abx received during recent admission (Levaquin.)  3/11: Will de-escalate Zosyn to 3.375gm IV Q8h.   Height: 5' 6.5" (168.9 cm) Weight: 221 lb (100.245 kg) IBW/kg (Calculated) : 60.45  Temp (24hrs), Avg:97.8 F (36.6 C), Min:97.7 F (36.5 C), Max:97.9 F (36.6 C)   Recent Labs Lab 03/11/15 1904 03/15/15 1558 03/16/15 0123 03/16/15 0522  WBC 8.0 9.7  --  7.8  CREATININE 1.07* 1.13*  --  1.06*  LATICACIDVEN  --   --  0.8  --     Estimated Creatinine Clearance: 68.9 mL/min (by C-G formula based on Cr of 1.06).    Allergies  Allergen Reactions  . Sulfa Antibiotics Hives    Antimicrobials this admission: Vanc  3/9 >>   Zosyn  3/9 >>   Dose adjustments this admission:   Microbiology results: 3/9 BCx: NGTD 3/9 UCx: pending  3/9  Sputum: pending   UA: (-) CXR: R base opacity  Thank you for allowing pharmacy to be a part of this patient's care.  Trase Bunda A 03/17/2015 11:14 AM

## 2015-03-17 NOTE — Consult Note (Signed)
PULMONARY CONSULT NOTE  Requesting MD/Service: Tressia Miners Date of initial consultation: 03/17/15 Reason for consultation: R pleural effusion  HPI:  Pt is known to me from office where I have seen her for COPD. She has history of anal cancer resection and ostomy formation in 05/2014. She was hospitalized in 02/2014 for PNA and COPD. Her CT chest revealed R pleural effusion, partially loculated, with RLL parenchymal opacity c/w PNA. She was readmitted 03/09 with sepsis, concern for cellulitis and possible PNA. She reports that she had severe LE edema and increasing abdominal girth. She has been diuresed with improvement in edema. She has been treated with pip-tazo with resolution of fevers. She has been treated with nebulized bronchodilators and reports no dyspnea presently.   Past Medical History  Diagnosis Date  . Schizophrenia ([place])   . Asthma   . GERD (gastroesophageal reflux disease)   . Anxiety   . Depression   . Bipolar disorder ([place])   . COPD (chronic obstructive pulmonary disease) ([place])   . Occasional tremors     right hand  . PTSD (post-traumatic stress disorder)   . Shortness of breath dyspnea   . Fibromyalgia   . DVT (deep venous thrombosis) ([place]) 2011    RUE  . Thyroid nodule   . DDD (degenerative disc disease), lumbar   . Spinal stenosis   . Peripheral neuropathy ([place])   . Rotator cuff tear     right  . Pneumonia 2011  . Hypothyroidism     no meds currently  . Anemia     during pregnancy only  . Hypertension     Off meds x 15 years-well controlled now per pt  . Squamous cell cancer, anus (HCC)   . Severe obstructive sleep apnea 06/27/2014  . DVT of upper extremity (deep vein thrombosis) ([place]) 10/14/2014    Past Surgical History  Procedure Laterality Date  . Foot surgery Right   . Tubal ligation    . Eye surgery Bilateral   . Mouth surgery  2002  . Rectal biopsy N/A 05/08/2014    Procedure: BIOPSY RECTAL;  Surgeon: Marlyce Huge, MD;  Location: [facility] ORS;   Service: General;  Laterality: N/A;  . Evaluation under anesthesia with hemorrhoidectomy N/A 05/08/2014    Procedure: EXAM UNDER ANESTHESIA WITH HEMORRHOIDECTOMY;  Surgeon: Marlyce Huge, MD;  Location: [facility] OR[facility];  Service: General;  Laterality: N/A;  . Portacath placement N/A 05/20/2014    Procedure: INSERTION PORT-A-CATH;  Surgeon: Florene Glen, MD;  Location: [facility] ORS;  Service: General;  Laterality: N/A;  . Laparoscopic diverted colostomy N/A 05/26/2014    Procedure: LAPAROSCOPIC DIVERTED COLOSTOMY;  Surgeon: Marlyce Huge, MD;  Location: [facility] OR[facility];  Service: General;  Laterality: N/A;  . Peripheral vascular catheterization Left 10/16/2014    Procedure: Upper Extremity Venography with thrombectomy, port removal;  Surgeon: Algernon Huxley, MD;  Location: [place] [facility];  Service: Cardiovascular;  Laterality: Left;  . Peripheral vascular catheterization  10/16/2014    Procedure: Upper Extremity Intervention;  Surgeon: Algernon Huxley, MD;  Location: [place] [facility];  Service: Cardiovascular;;    MEDICATIONS: I have reviewed all medications and confirmed regimen as documented  Social History   Social History  . Marital Status: Legally Separated    Spouse Name: N/A  . Number of Children: N/A  . Years of Education: N/A   Occupational History  . Not on file.   Social History Main Topics  . Smoking status: Current Some Day Smoker -- 0.10 packs/day  for 44 years    Types: Cigarettes    Start date: 10/04/1970  . Smokeless tobacco: Never Used  . Alcohol Use: No     Comment: 1 drink every 2-3 times/year  . Drug Use: Yes    Special: Marijuana     Comment: Pt states she uses marijuana every once in a while to help with appetite  . Sexual Activity: Yes   Other Topics Concern  . Not on file   Social History Narrative    Family History  Problem Relation Age of Onset  . Cancer Maternal Aunt   . Cancer Paternal Uncle   . Diabetes Mother   . Thyroid disease Mother    . Kidney failure Mother   . Hypertension Mother   . Depression Mother   . Mental illness Son   . Aneurysm Maternal Grandmother   . Thyroid disease Maternal Grandmother   . Stroke Maternal Grandfather   . Hypertension Maternal Grandfather   . Diabetes Maternal Grandfather   . Heart disease Maternal Grandfather     MI  . Nephrolithiasis Daughter     ROS: No myalgias/arthralgias, unexplained weight loss or weight gain No new focal weakness or sensory deficits No otalgia, hearing loss, visual changes, nasal and sinus symptoms, mouth and throat problems No neck pain or adenopathy No abdominal pain, N/V/D, diarrhea, change in bowel pattern No dysuria, change in urinary pattern No calf tenderness   Filed Vitals:   03/16/15 1115 03/16/15 1200 03/16/15 2141 03/17/15 0517  BP:  113/58 97/45 106/50  Pulse:  71 65 62  Temp:  97.7 F (36.5 C) 97.9 F (36.6 C) 97.7 F (36.5 C)  TempSrc:  Oral Oral Oral  Resp:  28 20   Height:      Weight:      SpO2: 92% 84% 95% 96%     EXAM:  Gen: WDWN, No overt respiratory distress HEENT: NCAT, oropharynx normal Neck: Supple without LAN, thyromegaly, JVD Lungs: breath sounds markedly diminished, percussion note normal, No wheezes Cardiovascular: Normal rate, reg rhythm, no murmurs noted Abdomen: Soft, nontender, normal BS Ext: without clubbing, cyanosis, edema Neuro: CNs grossly intact, motor and sensory intact, DTRs symmetric  DATA:   BMP Latest Ref Rng 03/16/2015 03/15/2015 03/11/2015  Glucose 65 - 99 mg/dL 119(H) 92 111(H)  BUN 6 - 20 mg/dL '14 15 15  '$ Creatinine 0.44 - 1.00 mg/dL 1.06(H) 1.13(H) 1.07(H)  Sodium 135 - 145 mmol/L 135 136 142  Potassium 3.5 - 5.1 mmol/L 4.0 4.3 4.4  Chloride 101 - 111 mmol/L 105 106 108  CO2 22 - 32 mmol/L '26 25 28  '$ Calcium 8.9 - 10.3 mg/dL 7.9(L) 8.1(L) 8.2(L)    CBC Latest Ref Rng 03/16/2015 03/15/2015 03/11/2015  WBC 3.6 - 11.0 K/uL 7.8 9.7 8.0  Hemoglobin 12.0 - 16.0 g/dL 9.5(L) 10.6(L) 10.2(L)   Hematocrit 35.0 - 47.0 % 28.8(L) 31.8(L) 30.5(L)  Platelets 150 - 440 K/uL 92(L) 103(L) 125(L)    CXR 03/15/15:  R pleural effusion +/- infiltrate or atelectasis  CTAP 03/15/15: There is increase in subcutaneous edema diffusely attributed to third spacing of fluid, and underlying ascites is similar to prior. Much of this is likely attributable to the patient's hypoproteinemia and hypoalbuminemia. The loculated right basilar pleural effusion has nodular it densities along the pleural margin concerning for possible pleural tumor.  IMPRESSION:   1) Febrile illness on admission - unclear source. Now resolved. I doubt PNA as explanation for her fever 2) Persistent R pleural effusion -  On the CTAP performed 03/09 this appears partially loculated and not significantly increased since CT chest performed 02/27. The RLL parenchymal opacity seen previously has improved. Although the official report suggests possible metastatic pleural disease, I am not convinced of this interpretation 3) severe COPD without acute bronchospasm presently  PLAN/REC:  1) will leave decisions re: antibiotics to primary team 2) I do not think a thoracentesis is necessary right now as the pleural effusion is not new and not obviously progressing. 3) I have [place] her systemic steroids in the absence of wheezing 4) continue Dulera 5) resume Spiriva inhaler daily 6) change nebulized bronchodilators to PRN only 7) She should be discharged on Symbicort, tiotropium and PRN albuterol (her previous COPD regimen) 8) she already has F/U scheduled with me in the office. I will make sure that a CXR is obtained @ that time   Wilhelmina Mcardle, MD [facility] Pul[facility], Critical Care Medicine

## 2015-03-17 NOTE — Progress Notes (Signed)
60 yr old female with pneumonia.  Patient also has a parastomal hernia. Patient has a functioning ostomy as well.  Patients pain much improved today, states she is feeling better   Filed Vitals:   03/17/15 0517 03/17/15 1300  BP: 106/50 107/61  Pulse: 62 71  Temp: 97.7 F (36.5 C) 97.9 F (36.6 C)  Resp:  22   PE:  Gen; NAD Res: dimished on right Cardio: RRR Abd: soft minmal tenderness in RUQ, no erythema on abdomen per my exam, functional ostomy Ext: 2+ pulses, no edema  CBC Latest Ref Rng 03/16/2015 03/15/2015 03/11/2015  WBC 3.6 - 11.0 K/uL 7.8 9.7 8.0  Hemoglobin 12.0 - 16.0 g/dL 9.5(L) 10.6(L) 10.2(L)  Hematocrit 35.0 - 47.0 % 28.8(L) 31.8(L) 30.5(L)  Platelets 150 - 440 K/uL 92(L) 103(L) 125(L)   CMP Latest Ref Rng 03/16/2015 03/15/2015 03/11/2015  Glucose 65 - 99 mg/dL 119(H) 92 111(H)  BUN 6 - 20 mg/dL '14 15 15  '$ Creatinine 0.44 - 1.00 mg/dL 1.06(H) 1.13(H) 1.07(H)  Sodium 135 - 145 mmol/L 135 136 142  Potassium 3.5 - 5.1 mmol/L 4.0 4.3 4.4  Chloride 101 - 111 mmol/L 105 106 108  CO2 22 - 32 mmol/L '26 25 28  '$ Calcium 8.9 - 10.3 mg/dL 7.9(L) 8.1(L) 8.2(L)  Total Protein 6.5 - 8.1 g/dL - 5.9(L) -  Total Bilirubin 0.3 - 1.2 mg/dL - 1.0 -  Alkaline Phos 38 - 126 U/L - 64 -  AST 15 - 41 U/L - 27 -  ALT 14 - 54 U/L - 14 -    A/P:  60 yr old female with pneumonia which is likely the cause of her fever, malasie and RUQ/Right chest pain.  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

## 2015-03-18 LAB — BASIC METABOLIC PANEL
ANION GAP: 6 (ref 5–15)
BUN: 17 mg/dL (ref 6–20)
CO2: 27 mmol/L (ref 22–32)
Calcium: 8.4 mg/dL — ABNORMAL LOW (ref 8.9–10.3)
Chloride: 108 mmol/L (ref 101–111)
Creatinine, Ser: 1.1 mg/dL — ABNORMAL HIGH (ref 0.44–1.00)
GFR calc non Af Amer: 54 mL/min — ABNORMAL LOW (ref >60)
GLUCOSE: 89 mg/dL (ref 65–99)
POTASSIUM: 3.5 mmol/L (ref 3.5–5.1)
Sodium: 141 mmol/L (ref 135–145)

## 2015-03-18 LAB — CBC
HEMATOCRIT: 28 % — AB (ref 35.0–47.0)
HEMOGLOBIN: 9.3 g/dL — AB (ref 12.0–16.0)
MCH: 31.2 pg (ref 26.0–34.0)
MCHC: 33.3 g/dL (ref 32.0–36.0)
MCV: 93.7 fL (ref 80.0–100.0)
Platelets: 128 10*3/uL — ABNORMAL LOW (ref 150–440)
RBC: 2.99 MIL/uL — AB (ref 3.80–5.20)
RDW: 16.8 % — ABNORMAL HIGH (ref 11.5–14.5)
WBC: 8.1 10*3/uL (ref 3.6–11.0)

## 2015-03-18 MED ORDER — AMOXICILLIN-POT CLAVULANATE 875-125 MG PO TABS: 1.0000 | ORAL_TABLET | Freq: Two times a day (BID) | ORAL | Status: DC

## 2015-03-18 MED ORDER — PREGABALIN 200 MG PO CAPS: 200.0000 mg | ORAL_CAPSULE | Freq: Two times a day (BID) | ORAL | Status: DC | PRN

## 2015-03-18 MED ORDER — TRAZODONE HCL 150 MG PO TABS: 150.0000 mg | ORAL_TABLET | Freq: Every day | ORAL | Status: DC

## 2015-03-18 NOTE — Discharge Summary (Signed)
[facility] NAME: [name]    MR#:  [id number]  DATE OF BIRTH:  [birth year]  DATE OF ADMISSION:  03/15/2015 ADMITTING PHYSICIAN: Lance Coon, MD  DATE OF DISCHARGE: 03/18/2015  PRIMARY CARE PHYSICIAN: [name], NP    ADMISSION DIAGNOSIS:  Recurrent pneumonia [J18.9] Fever, unspecified fever cause [R50.9]  DISCHARGE DIAGNOSIS:  Principal Problem:   Sepsis ([place]) Active Problems:   Anxiety   HTN (hypertension)   HLD (hyperlipidemia)   Severe obstructive sleep apnea   COPD exacerbation (HCC)   HCAP (healthcare-associated pneumonia)   Recurrent pneumonia   RUQ abdominal pain   SECONDARY DIAGNOSIS:   Past Medical History  Diagnosis Date  . Schizophrenia ([place])   . Asthma   . GERD (gastroesophageal reflux disease)   . Anxiety   . Depression   . Bipolar disorder ([place])   . COPD (chronic obstructive pulmonary disease) ([place])   . Occasional tremors     right hand  . PTSD (post-traumatic stress disorder)   . Shortness of breath dyspnea   . Fibromyalgia   . DVT (deep venous thrombosis) ([place]) 2011    RUE  . Thyroid nodule   . DDD (degenerative disc disease), lumbar   . Spinal stenosis   . Peripheral neuropathy ([place])   . Rotator cuff tear     right  . Pneumonia 2011  . Hypothyroidism     no meds currently  . Anemia     during pregnancy only  . Hypertension     Off meds x 15 years-well controlled now per pt  . Squamous cell cancer, anus (HCC)   . Severe obstructive sleep apnea 06/27/2014  . DVT of upper extremity (deep vein thrombosis) ([place]) 10/14/2014    HOSPITAL COURSE:   *[name] is a 60 y.o. female who presents with fever, dyspnea. Patient was recently admitted here with pneumonia. Shortly prior that she had abdominal surgery with colostomy. She was in her surgeon's office for follow-up visit and was stating that she felt like she may be developing something of a cellulitis.   1.  Sepsis ([place]) - secondary to RLL pneumonia- recurrent and also abd wall cellulitis - Improving, blood cultures are negative - Patient is hemodynamically stable -Improving symptoms - off o2, on room air. Being discharged on oral augmentin  2. COPD exacerbation ([place]) - likely due to her pneumonia, off steroids, cont nebs - ongoing smoking- counseled. Cont nicotine patch  3.HCAP (healthcare-associated pneumonia) - RLL with possible minimal right pleural effusion, may be loculated - cont antibiotics- being discharged on augmentin  -CT chest shows possible loculated right effusion with ?pleural based mass.  -Pulmonary consult appreciated- CT effusion appears chronic per Dr. Alva Garnet- outpatient follow up recommended and no further testing recommended  4. Anxiety - home dose anxiolytics  5. HTN (hypertension) - stable, continue home meds  6. obstructive sleep apnea - CPAP as tolerated. Not on home o2.  7. 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 - patient's oncologist Dr. Mike Gip has discussed with Dr. dew and they both were agreeable to discontinue Coumadin as the patient's clot is completely dissolved.  Discharge home today  DISCHARGE CONDITIONS:   Stable  CONSULTS OBTAINED:  Treatment Team:  Wilhelmina Mcardle, MD  DRUG ALLERGIES:   Allergies  Allergen Reactions  . Sulfa Antibiotics  Hives    DISCHARGE MEDICATIONS:   Current Discharge Medication List    START taking these medications   Details  amoxicillin-clavulanate (AUGMENTIN) 875-125 MG tablet Take 1 tablet by mouth 2 (two) times daily. X 5 more days Qty: 10 tablet, Refills: 0    traZODone (DESYREL) 150 MG tablet Take 1 tablet (150 mg total) by mouth at bedtime. Qty: 30 tablet, Refills: 1      CONTINUE these medications which have CHANGED   Details  pregabalin  (LYRICA) 200 MG capsule Take 1 capsule (200 mg total) by mouth 2 (two) times daily as needed. Qty: 90 capsule, Refills: 6      CONTINUE these medications which have NOT CHANGED   Details  albuterol (PROVENTIL HFA;VENTOLIN HFA) 108 (90 BASE) MCG/ACT inhaler Inhale 2 puffs into the lungs every 6 (six) hours as needed for wheezing or shortness of breath.     albuterol (PROVENTIL) (2.5 MG/3ML) 0.083% nebulizer solution Take 3 mLs (2.5 mg total) by nebulization every 4 (four) hours. Qty: 360 vial, Refills: 12    ARIPiprazole (ABILIFY) 2 MG tablet Take 1 tablet (2 mg total) by mouth daily. Qty: 30 tablet, Refills: 4    baclofen (LIORESAL) 10 MG tablet Take 1 tablet (10 mg total) by mouth 3 (three) times daily. Qty: 90 each, Refills: 6    budesonide-formoterol (SYMBICORT) 160-4.5 MCG/ACT inhaler Inhale 2 puffs into the lungs 2 (two) times daily. Qty: 1 Inhaler, Refills: 12    Calcium Carb-Cholecalciferol (CALCIUM 600+D) 600-800 MG-UNIT TABS Take 1 tablet by mouth 2 (two) times daily.     clonazePAM (KLONOPIN) 1 MG tablet Take 1 tablet (1 mg total) by mouth 3 (three) times daily as needed for anxiety. Qty: 90 tablet, Refills: 4    DULoxetine (CYMBALTA) 60 MG capsule Take 60 mg by mouth at bedtime.    escitalopram (LEXAPRO) 20 MG tablet Take 1 tablet (20 mg total) by mouth daily. Qty: 30 tablet, Refills: 4    folic acid (FOLVITE) 1 MG tablet Take 1 tablet (1 mg total) by mouth daily. Qty: 30 tablet, Refills: 3    furosemide (LASIX) 20 MG tablet Take 1 tablet (20 mg total) by mouth daily. Qty: 14 tablet, Refills: 11    nicotine (NICODERM CQ - DOSED IN MG/24 HOURS) 21 mg/24hr patch Place 1 patch (21 mg total) onto the skin daily. Qty: 28 patch, Refills: 0    nystatin (MYCOSTATIN) 100000 UNIT/ML suspension Take 5 mLs (500,000 Units total) by mouth 4 (four) times daily. Qty: 60 mL, Refills: 2    oxyCODONE-acetaminophen (PERCOCET) 10-325 MG tablet Take 1 tablet by mouth every 6 (six)  hours as needed for pain. Qty: 30 tablet, Refills: 0    pantoprazole (PROTONIX) 40 MG tablet Take 40 mg by mouth 2 (two) times daily.     polyethylene glycol (MIRALAX / GLYCOLAX) packet Take 17 g by mouth daily. Qty: 14 each, Refills: 0    senna-docusate (SENOKOT-S) 8.6-50 MG tablet Take 1 tablet by mouth at bedtime as needed for mild constipation. Qty: 30 tablet, Refills: 5    tiotropium (SPIRIVA) 18 MCG inhalation capsule Place 1 capsule (18 mcg total) into inhaler and inhale daily. Qty: 30 capsule, Refills: 12    zolpidem (AMBIEN) 10 MG tablet Take 1 tablet (10 mg total) by mouth at bedtime as needed for sleep. Qty: 30 tablet, Refills: 4      STOP taking these medications     ipratropium-albuterol (DUONEB) 0.5-2.5 (3) MG/3ML SOLN  DISCHARGE INSTRUCTIONS:   1. PCP f/u in 1-2 weeks 2. Pulmonary follow up in 2 weeks   If you experience worsening of your admission symptoms, develop shortness of breath, life threatening emergency, suicidal or homicidal thoughts you must seek medical attention immediately by calling 911 or calling your MD immediately  if symptoms less severe.  You Must read complete instructions/literature along with all the possible adverse reactions/side effects for all the Medicines you take and that have been prescribed to you. Take any new Medicines after you have completely understood and accept all the possible adverse reactions/side effects.   Please note  You were cared for by a hospitalist during your hospital stay. If you have any questions about your discharge medications or the care you received while you were in the hospital after you are discharged, you can call the unit and asked to speak with the hospitalist on call if the hospitalist that took care of you is not available. Once you are discharged, your primary care physician will handle any further medical issues. Please note that NO REFILLS for any discharge medications will be authorized  once you are discharged, as it is imperative that you return to your primary care physician (or establish a relationship with a primary care physician if you do not have one) for your aftercare needs so that they can reassess your need for medications and monitor your lab values.    Today   CHIEF COMPLAINT:   Chief Complaint  Patient presents with  . Fever    redness at surgery site    VITAL SIGNS:  Blood pressure 121/52, pulse 70, temperature 97.9 F (36.6 C), temperature source Oral, resp. rate 20, height 5' 6.5" (1.689 m), weight 100.245 kg (221 lb), SpO2 98 %.  I/O:   Intake/Output Summary (Last 24 hours) at 03/18/15 1450 Last data filed at 03/18/15 1300  Gross per 24 hour  Intake    600 ml  Output      0 ml  Net    600 ml    PHYSICAL EXAMINATION:   Physical Exam  GENERAL: 60 y.o.-year-old patient lying in the bed with no acute distress.  EYES: Pupils equal, round, reactive to light and accommodation. No scleral icterus. Extraocular muscles intact.  HEENT: Head atraumatic, normocephalic. Oropharynx and nasopharynx clear.  NECK: Supple, no jugular venous distention. No thyroid enlargement, no tenderness.  LUNGS: diffuse scattered expiratory wheezing, No rales, rhonchi. No use of accessory muscles of respiration.  CARDIOVASCULAR: S1, S2 normal. No murmurs, rubs, or gallops.  ABDOMEN: Soft, nontender, nondistended. Bowel sounds present. No organomegaly or mass.  LLQ colostomy bag with stool in it, no erythema of the abd wall noted. EXTREMITIES: No cyanosis, clubbing. 1+ bilateral pedal edema noted NEUROLOGIC: Cranial nerves II through XII are intact. No focal Motor or sensory deficits b/l.  PSYCHIATRIC: patient is alert and oriented x 3.  SKIN: No obvious rash, lesion, or ulcer.   DATA REVIEW:   CBC  Recent Labs Lab 03/18/15 0508  WBC 8.1  HGB 9.3*  HCT 28.0*  PLT 128*    Chemistries   Recent Labs Lab 03/15/15 1558  03/18/15 0508  NA 136   < > 141  K 4.3  < > 3.5  CL 106  < > 108  CO2 25  < > 27  GLUCOSE 92  < > 89  BUN 15  < > 17  CREATININE 1.13*  < > 1.10*  CALCIUM 8.1*  < > 8.4*  AST  27  --   --   ALT 14  --   --   ALKPHOS 64  --   --   BILITOT 1.0  --   --   < > = values in this interval not displayed.  Cardiac Enzymes No results for input(s): TROPONINI in the last 168 hours.  Microbiology Results  Results for orders placed or performed during the hospital encounter of 03/15/15  Blood culture (routine x 2)     Status: None (Preliminary result)   Collection Time: 03/15/15  8:21 PM  Result Value Ref Range Status   Specimen Description BLOOD LEFT ASSIST CONTROL  Final   Special Requests BOTTLES DRAWN AEROBIC AND ANAEROBIC 8CC  Final   Culture NO GROWTH 3 DAYS  Final   Report Status PENDING  Incomplete  Blood culture (routine x 2)     Status: None (Preliminary result)   Collection Time: 03/15/15  8:21 PM  Result Value Ref Range Status   Specimen Description BLOOD RIGHT ASSIST CONTROL  Final   Special Requests BOTTLES DRAWN AEROBIC AND ANAEROBIC 8CC  Final   Culture NO GROWTH 3 DAYS  Final   Report Status PENDING  Incomplete    RADIOLOGY:  No results found.  EKG:   Orders placed or performed during the hospital encounter of 03/05/15  . EKG 12-Lead  . EKG 12-Lead  . ED EKG  . ED EKG      Management plans discussed with the patient, family and they are in agreement.  CODE STATUS:     Code Status Orders        Start     Ordered   03/16/15 0142  Full code   Continuous     03/16/15 0141    Code Status History    Date Active Date Inactive Code Status Order ID Comments User Context   03/05/2015  6:17 AM 03/07/2015  8:09 PM Full Code [id number]  [name], MD Inpatient   02/26/2015  5:43 PM 03/03/2015  6:54 PM Full Code [id number]  Bettey Costa, MD Inpatient   10/14/2014 10:59 PM 10/17/2014  4:34 PM Full Code [id number]  Lytle Butte, MD ED   05/17/2014  8:38 PM 05/30/2014  4:55 PM Full Code  [id number]  Fritzi Mandes, MD Inpatient    Advance Directive Documentation        Most Recent Value   Type of Advance Directive  Living will   Pre-existing out of facility DNR order (yellow form or pink MOST form)     "MOST" Form in Place?        TOTAL TIME TAKING CARE OF THIS PATIENT: 37 minutes.    Gladstone Lighter M.D on 03/18/2015 at 2:50 PM  Between 7am to 6pm - Pager - [phone]  After 6pm go to [email or web address] - password [email or web address] [facility]  Office  [phone]  CC: Primary care physician; [name], NP

## 2015-03-18 NOTE — Progress Notes (Signed)
60 yr old female with pneumonia.  Patient also has a parastomal hernia. Patient slightly confused this AM, keeps talking about cleaning things.  But states no abdominal pain and she has had good ostomy output.   Filed Vitals:   03/18/15 0450 03/18/15 0823  BP: 125/59 111/59  Pulse: 89 87  Temp: 97.7 F (36.5 C)   Resp:  20   PE:  Gen; NAD Res: dimished on right Cardio: RRR Abd: soft minmal tenderness in RUQ, no erythema on abdomen per my exam, functional ostomy Ext: 2+ pulses, no edema  CBC Latest Ref Rng 03/18/2015 03/16/2015 03/15/2015  WBC 3.6 - 11.0 K/uL 8.1 7.8 9.7  Hemoglobin 12.0 - 16.0 g/dL 9.3(L) 9.5(L) 10.6(L)  Hematocrit 35.0 - 47.0 % 28.0(L) 28.8(L) 31.8(L)  Platelets 150 - 440 K/uL 128(L) 92(L) 103(L)   CMP Latest Ref Rng 03/18/2015 03/16/2015 03/15/2015  Glucose 65 - 99 mg/dL 89 119(H) 92  BUN 6 - 20 mg/dL '17 14 15  '$ Creatinine 0.44 - 1.00 mg/dL 1.10(H) 1.06(H) 1.13(H)  Sodium 135 - 145 mmol/L 141 135 136  Potassium 3.5 - 5.1 mmol/L 3.5 4.0 4.3  Chloride 101 - 111 mmol/L 108 105 106  CO2 22 - 32 mmol/L '27 26 25  '$ Calcium 8.9 - 10.3 mg/dL 8.4(L) 7.9(L) 8.1(L)  Total Protein 6.5 - 8.1 g/dL - - 5.9(L)  Total Bilirubin 0.3 - 1.2 mg/dL - - 1.0  Alkaline Phos 38 - 126 U/L - - 64  AST 15 - 41 U/L - - 27  ALT 14 - 54 U/L - - 14    A/P:  60 yr old female with pneumonia which is likely the cause of her fever, malasie and RUQ/Right chest pain.  She had improved and having ostomy output.  Will sign off please call with ?s or concerns

## 2015-03-18 NOTE — Progress Notes (Signed)
Pt has been dischrged home. Discharge instructions given and explained to pt. Pt verbalized understanding. Follow up appointments and meds reviewed with pt. RX given to pt.

## 2015-03-19 ENCOUNTER — Other Ambulatory Visit: Payer: Self-pay | Admitting: *Deleted

## 2015-03-19 ENCOUNTER — Telehealth: Payer: Self-pay | Admitting: *Deleted

## 2015-03-19 DIAGNOSIS — J90 Pleural effusion, not elsewhere classified: Secondary | ICD-10-CM

## 2015-03-19 MED ORDER — OXYCODONE-ACETAMINOPHEN 10-325 MG PO TABS: 1.0000 | ORAL_TABLET | Freq: Four times a day (QID) | ORAL | Status: DC | PRN

## 2015-03-19 NOTE — Telephone Encounter (Signed)
I have called the pain clinic again and was told they will call her now, I called the floor and asked what rx pt was sent home with and was told no Oxycodone was given to her. Pt called again and she stated she has enough med to last until the 24 th and that the pain clinic has given her an appt for 4/24. I explained to her that per Dr Mike Gip, her giving her pain med is a temporary thing as her pain has nothing to do with her cancer. She stated "oh ,ok" I also explained to her that she cannot pick up rx before 3/15 and will only get #30 not # 60 as requested

## 2015-03-19 NOTE — Telephone Encounter (Signed)
-----   Message from Wilhelmina Mcardle, MD sent at 03/17/2015 12:23 PM EST ----- Please make sure she has a CXR ordered to be done same day as her follow up with me (She thinks it is scheduled for 4/11)  Thanks  Waunita Schooner

## 2015-03-19 NOTE — Telephone Encounter (Signed)
Rx was filled on 3/10

## 2015-03-19 NOTE — Telephone Encounter (Signed)
LMOM for pt to call me back. Her appt is 04/23/15 '@10'$ :15am. Pt will need CXR prio to appt. Will await call back.

## 2015-03-20 ENCOUNTER — Ambulatory Visit: Payer: Commercial Managed Care - HMO | Admitting: Hematology and Oncology

## 2015-03-20 ENCOUNTER — Telehealth: Payer: Self-pay | Admitting: Surgery

## 2015-03-20 LAB — CULTURE, BLOOD (ROUTINE X 2)
CULTURE: NO GROWTH
Culture: NO GROWTH

## 2015-03-20 NOTE — Telephone Encounter (Signed)
Patient left a voice message that where she had her colostomy is hurting so much that when she coughs she has to hold the bag. It was mentioned to her that she might have a hernia. Should I go ahead and schedule her for that or do you need to talk to her about the pain she is having first?

## 2015-03-20 NOTE — Addendum Note (Signed)
Addended by: Oscar La R on: 03/20/2015 12:10 PM   Modules accepted: Orders

## 2015-03-20 NOTE — Telephone Encounter (Signed)
Pt called in regards to sharp pain she is having on both sides at her lungs when she breaths and coughs. Per DR, pt informed to go get a STAT CXR today or either go to ER. Pt states she can't go today because she has no transportation. Ask pt if a family member could take her and she states her son nor daughter have a vehicle. Informed pt again of the recommendation and pt states she would go get the CXR on Thursday. Informed pt CXR would still be placed.   Pt also informed of her f/u appt per DS. Nothing further needed.

## 2015-03-20 NOTE — Telephone Encounter (Signed)
LM on daughter's Vm asking for someone to call back in regards to pt's up coming appt.

## 2015-03-21 ENCOUNTER — Other Ambulatory Visit: Payer: Self-pay

## 2015-03-21 ENCOUNTER — Inpatient Hospital Stay: Payer: Commercial Managed Care - HMO

## 2015-03-21 DIAGNOSIS — F419 Anxiety disorder, unspecified: Secondary | ICD-10-CM | POA: Diagnosis not present

## 2015-03-21 DIAGNOSIS — Z86718 Personal history of other venous thrombosis and embolism: Secondary | ICD-10-CM | POA: Diagnosis not present

## 2015-03-21 DIAGNOSIS — C210 Malignant neoplasm of anus, unspecified: Secondary | ICD-10-CM

## 2015-03-21 DIAGNOSIS — F209 Schizophrenia, unspecified: Secondary | ICD-10-CM | POA: Diagnosis not present

## 2015-03-21 DIAGNOSIS — Z79899 Other long term (current) drug therapy: Secondary | ICD-10-CM | POA: Diagnosis not present

## 2015-03-21 DIAGNOSIS — F319 Bipolar disorder, unspecified: Secondary | ICD-10-CM | POA: Diagnosis not present

## 2015-03-21 DIAGNOSIS — R109 Unspecified abdominal pain: Secondary | ICD-10-CM | POA: Diagnosis not present

## 2015-03-21 DIAGNOSIS — E039 Hypothyroidism, unspecified: Secondary | ICD-10-CM | POA: Diagnosis not present

## 2015-03-21 DIAGNOSIS — G4733 Obstructive sleep apnea (adult) (pediatric): Secondary | ICD-10-CM | POA: Diagnosis not present

## 2015-03-21 DIAGNOSIS — M797 Fibromyalgia: Secondary | ICD-10-CM | POA: Diagnosis not present

## 2015-03-21 DIAGNOSIS — F4310 Post-traumatic stress disorder, unspecified: Secondary | ICD-10-CM | POA: Diagnosis not present

## 2015-03-21 DIAGNOSIS — J449 Chronic obstructive pulmonary disease, unspecified: Secondary | ICD-10-CM | POA: Diagnosis not present

## 2015-03-21 DIAGNOSIS — F329 Major depressive disorder, single episode, unspecified: Secondary | ICD-10-CM | POA: Diagnosis not present

## 2015-03-21 DIAGNOSIS — Z8701 Personal history of pneumonia (recurrent): Secondary | ICD-10-CM | POA: Diagnosis not present

## 2015-03-21 DIAGNOSIS — J45909 Unspecified asthma, uncomplicated: Secondary | ICD-10-CM | POA: Diagnosis not present

## 2015-03-21 DIAGNOSIS — K219 Gastro-esophageal reflux disease without esophagitis: Secondary | ICD-10-CM | POA: Diagnosis not present

## 2015-03-21 DIAGNOSIS — G629 Polyneuropathy, unspecified: Secondary | ICD-10-CM | POA: Diagnosis not present

## 2015-03-21 DIAGNOSIS — F17210 Nicotine dependence, cigarettes, uncomplicated: Secondary | ICD-10-CM | POA: Diagnosis not present

## 2015-03-21 DIAGNOSIS — I10 Essential (primary) hypertension: Secondary | ICD-10-CM | POA: Diagnosis not present

## 2015-03-21 LAB — CREATININE, SERUM
Creatinine, Ser: 0.98 mg/dL (ref 0.44–1.00)
GFR calc Af Amer: >60 mL/min (ref >60)
GFR calc non Af Amer: >60 mL/min (ref >60)

## 2015-03-21 MED ORDER — OXYCODONE-ACETAMINOPHEN 10-325 MG PO TABS: 1.0000 | ORAL_TABLET | Freq: Four times a day (QID) | ORAL | Status: DC | PRN

## 2015-03-21 NOTE — Telephone Encounter (Signed)
Returned phone call to patient. No answer. Left voicemail for return phone call.

## 2015-03-22 ENCOUNTER — Emergency Department: Payer: Commercial Managed Care - HMO

## 2015-03-22 ENCOUNTER — Emergency Department
Admission: EM | Admit: 2015-03-22 | Discharge: 2015-03-22 | Disposition: A | Discharge status: Home / Self Care | Payer: Commercial Managed Care - HMO | Attending: Emergency Medicine | Admitting: Emergency Medicine

## 2015-03-22 ENCOUNTER — Ambulatory Visit: Admission: RE | Admit: 2015-03-22 | Payer: Commercial Managed Care - HMO | Source: Ambulatory Visit

## 2015-03-22 DIAGNOSIS — F17210 Nicotine dependence, cigarettes, uncomplicated: Secondary | ICD-10-CM | POA: Diagnosis not present

## 2015-03-22 DIAGNOSIS — R0600 Dyspnea, unspecified: Secondary | ICD-10-CM | POA: Insufficient documentation

## 2015-03-22 DIAGNOSIS — J90 Pleural effusion, not elsewhere classified: Secondary | ICD-10-CM | POA: Diagnosis not present

## 2015-03-22 DIAGNOSIS — I10 Essential (primary) hypertension: Secondary | ICD-10-CM | POA: Diagnosis not present

## 2015-03-22 DIAGNOSIS — Z9889 Other specified postprocedural states: Secondary | ICD-10-CM

## 2015-03-22 DIAGNOSIS — R0602 Shortness of breath: Secondary | ICD-10-CM | POA: Diagnosis present

## 2015-03-22 LAB — BASIC METABOLIC PANEL
Anion gap: 7 (ref 5–15)
BUN: 11 mg/dL (ref 6–20)
CHLORIDE: 105 mmol/L (ref 101–111)
CO2: 21 mmol/L — AB (ref 22–32)
Calcium: 8 mg/dL — ABNORMAL LOW (ref 8.9–10.3)
Creatinine, Ser: 0.79 mg/dL (ref 0.44–1.00)
GFR calc Af Amer: >60 mL/min (ref >60)
GFR calc non Af Amer: >60 mL/min (ref >60)
GLUCOSE: 93 mg/dL (ref 65–99)
POTASSIUM: 3.8 mmol/L (ref 3.5–5.1)
Sodium: 133 mmol/L — ABNORMAL LOW (ref 135–145)

## 2015-03-22 LAB — CBC WITH DIFFERENTIAL/PLATELET
Basophils Absolute: 0 10*3/uL (ref 0–0.1)
Basophils Relative: 0 %
Eosinophils Absolute: 0.3 10*3/uL (ref 0–0.7)
Eosinophils Relative: 4 %
HEMATOCRIT: 32.4 % — AB (ref 35.0–47.0)
HEMOGLOBIN: 10.7 g/dL — AB (ref 12.0–16.0)
LYMPHS ABS: 0.5 10*3/uL — AB (ref 1.0–3.6)
LYMPHS PCT: 7 %
MCH: 30.6 pg (ref 26.0–34.0)
MCHC: 33 g/dL (ref 32.0–36.0)
MCV: 92.9 fL (ref 80.0–100.0)
Monocytes Absolute: 0.4 10*3/uL (ref 0.2–0.9)
Monocytes Relative: 6 %
NEUTROS PCT: 83 %
Neutro Abs: 5.5 10*3/uL (ref 1.4–6.5)
Platelets: 185 10*3/uL (ref 150–440)
RBC: 3.49 MIL/uL — AB (ref 3.80–5.20)
RDW: 16.8 % — ABNORMAL HIGH (ref 11.5–14.5)
WBC: 6.7 10*3/uL (ref 3.6–11.0)

## 2015-03-22 LAB — BODY FLUID CELL COUNT WITH DIFFERENTIAL
EOS FL: 20 %
LYMPHS FL: 8 %
Monocyte-Macrophage-Serous Fluid: 11 %
NEUTROPHIL FLUID: 61 %
OTHER CELLS FL: 0 %
WBC FLUID: 874 uL

## 2015-03-22 LAB — LACTATE DEHYDROGENASE, PLEURAL OR PERITONEAL FLUID: LD FL: 456 U/L — AB (ref 3–23)

## 2015-03-22 LAB — BRAIN NATRIURETIC PEPTIDE: B NATRIURETIC PEPTIDE 5: 80 pg/mL (ref 0.0–100.0)

## 2015-03-22 LAB — GLUCOSE, SEROUS FLUID: Glucose, Fluid: 94 mg/dL

## 2015-03-22 LAB — TROPONIN I: Troponin I: <0.03 ng/mL (ref <0.031)

## 2015-03-22 LAB — PROTEIN, BODY FLUID: Total protein, fluid: <3 g/dL

## 2015-03-22 MED ORDER — OXYCODONE-ACETAMINOPHEN 5-325 MG PO TABS: 2.0000 | ORAL_TABLET | Freq: Four times a day (QID) | ORAL | Status: DC | PRN

## 2015-03-22 MED ORDER — OXYCODONE-ACETAMINOPHEN 5-325 MG PO TABS
2.0000 | ORAL_TABLET | Freq: Once | ORAL | Status: AC
  Administered 2015-03-22: 2 via ORAL
  Filled 2015-03-22: qty 2

## 2015-03-22 MED ORDER — IPRATROPIUM-ALBUTEROL 0.5-2.5 (3) MG/3ML IN SOLN
3.0000 mL | Freq: Once | RESPIRATORY_TRACT | Status: AC
  Administered 2015-03-22: 3 mL via RESPIRATORY_TRACT
  Filled 2015-03-22: qty 3

## 2015-03-22 MED ORDER — METHYLPREDNISOLONE SODIUM SUCC 125 MG IJ SOLR
125.0000 mg | Freq: Once | INTRAMUSCULAR | Status: AC
  Administered 2015-03-22: 125 mg via INTRAVENOUS
  Filled 2015-03-22: qty 2

## 2015-03-22 NOTE — ED Notes (Signed)
Pt comes into the ED via EMS from home with c/op SOB, EMS reports 83% on RA before they gave her a duoneb, pt states she took one at home this morning around 5am.. States recently here and dx with pneumonia.. Pt is a/ox4 on arrival.. Pt is c/o pain around colostomy that radiates around the abd and into the back.

## 2015-03-22 NOTE — ED Provider Notes (Addendum)
[facility] Emergency Department Provider Note     Time seen: ----------------------------------------- 10:40 AM on 03/22/2015 -----------------------------------------    I have reviewed the triage vital signs and the nursing notes.   HISTORY  Chief Complaint Shortness of Breath    HPI [name] is a 60 y.o. female who presents ER for shortness of breath and pain when she coughs. Patient has right-sided breast and chest pain is worse when she coughs, states the pain radiates in to her colostomy. Patient states she was recently admitted for pneumonia, states she still has somewhat of a productive cough. She received 2 breathing treatments prior to arrival which is helped some.   Past Medical History  Diagnosis Date  . Schizophrenia ([place])   . Asthma   . GERD (gastroesophageal reflux disease)   . Anxiety   . Depression   . Bipolar disorder ([place])   . COPD (chronic obstructive pulmonary disease) ([place])   . Occasional tremors     right hand  . PTSD (post-traumatic stress disorder)   . Shortness of breath dyspnea   . Fibromyalgia   . DVT (deep venous thrombosis) ([place]) 2011    RUE  . Thyroid nodule   . DDD (degenerative disc disease), lumbar   . Spinal stenosis   . Peripheral neuropathy ([place])   . Rotator cuff tear     right  . Pneumonia 2011  . Hypothyroidism     no meds currently  . Anemia     during pregnancy only  . Hypertension     Off meds x 15 years-well controlled now per pt  . Squamous cell cancer, anus (HCC)   . Severe obstructive sleep apnea 06/27/2014  . DVT of upper extremity (deep vein thrombosis) ([place]) 10/14/2014    Patient Active Problem List   Diagnosis Date Noted  . Recurrent pneumonia   . RUQ abdominal pain   . Sepsis ([place]) 03/15/2015  . CAP (community acquired pneumonia) 03/15/2015  . Pyrexia   . Coarse tremors 03/14/2015  . Insomnia 03/14/2015  . Poor mobility 03/14/2015  . HCAP (healthcare-associated  pneumonia) 03/05/2015  . 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 GI bleed 03/02/2015  . SBO (small bowel obstruction) ([place])   . Anemia   . COPD exacerbation ([place]) 02/27/2015  . Aspiration pneumonitis ([place]) 02/27/2015  . Small bowel obstruction ([place]) 02/27/2015  . Hyperglycemia 02/27/2015  . Chronic pain syndrome 02/27/2015  . COPD (chronic obstructive pulmonary disease) ([place]) 02/26/2015  . COPD, very severe ([place]) 12/12/2014  . Fibromyalgia 12/12/2014  . DVT of upper extremity (deep vein thrombosis) ([place]) 10/14/2014  . B12 deficiency 09/28/2014  . Folate deficiency 09/28/2014  . Macrocytosis 09/21/2014  . Depression   . Hypokalemia 07/20/2014  . Anal cancer ([place]) 06/27/2014  . Anal fissure 06/27/2014  . Anxiety 06/27/2014  . Airway hyperreactivity 06/27/2014  . H/O manic depressive disorder 06/27/2014  . CAFL (chronic airflow limitation) ([place]) 06/27/2014  . Clinical depression 06/27/2014  . Deep vein thrombosis ([place]) 06/27/2014  . External hemorrhoid 06/27/2014  . Myalgia and myositis 06/27/2014  . Acid reflux 06/27/2014  . Hemorrhoid 06/27/2014  . HTN (hypertension) 06/27/2014  . HLD (hyperlipidemia) 06/27/2014  . Adult hypothyroidism 06/27/2014  . LBP (low back pain) 06/27/2014  . Mass of perianal area 06/27/2014  . External hemorrhoid, thrombosed 06/27/2014  . 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 Ulcerated hemorrhoid 06/27/2014  . Severe obstructive sleep apnea 06/27/2014  . Squamous cell cancer, anus (  [place])   . Rectal ulcer 05/17/2014    Past Surgical History  Procedure Laterality Date  . Foot surgery Right   . Tubal ligation    . Eye surgery Bilateral   . Mouth surgery  2002  . Rectal biopsy N/A 05/08/2014    Procedure: BIOPSY RECTAL;  Surgeon: Marlyce Huge, MD;  Location: [facility] ORS;  Service: General;  Laterality: N/A;  . Evaluation under anesthesia with hemorrhoidectomy N/A 05/08/2014    Procedure: EXAM UNDER ANESTHESIA  WITH HEMORRHOIDECTOMY;  Surgeon: Marlyce Huge, MD;  Location: [facility] ORS;  Service: General;  Laterality: N/A;  . Portacath placement N/A 05/20/2014    Procedure: INSERTION PORT-A-CATH;  Surgeon: Florene Glen, MD;  Location: [facility] ORS;  Service: General;  Laterality: N/A;  . Laparoscopic diverted colostomy N/A 05/26/2014    Procedure: LAPAROSCOPIC DIVERTED COLOSTOMY;  Surgeon: Marlyce Huge, MD;  Location: [facility] ORS;  Service: General;  Laterality: N/A;  . Peripheral vascular catheterization Left 10/16/2014    Procedure: Upper Extremity Venography with thrombectomy, port removal;  Surgeon: Algernon Huxley, MD;  Location: [place] [facility];  Service: Cardiovascular;  Laterality: Left;  . Peripheral vascular catheterization  10/16/2014    Procedure: Upper Extremity Intervention;  Surgeon: Algernon Huxley, MD;  Location: [place] [facility];  Service: Cardiovascular;;    Allergies Sulfa antibiotics  Social History Social History  Substance Use Topics  . Smoking status: Current Some Day Smoker -- 0.10 packs/day for 44 years    Types: Cigarettes    Start date: 10/04/1970  . Smokeless tobacco: Never Used  . Alcohol Use: No     Comment: 1 drink every 2-3 times/year    Review of Systems Constitutional: Negative for fever. Eyes: Negative for visual changes. ENT: Negative for sore throat. Cardiovascular: Negative for chest pain. Respiratory: Positive shortness of breath and cough Gastrointestinal: Negative for abdominal pain, vomiting and diarrhea. Genitourinary: Negative for dysuria. Musculoskeletal: Negative for back pain. Skin: Negative for rash. Neurological: Negative for headaches, positive for weakness  10-point ROS otherwise negative.  ____________________________________________   PHYSICAL EXAM:  VITAL SIGNS: ED Triage Vitals  Enc Vitals Group     BP 03/22/15 1019 134/81 mmHg     Pulse Rate 03/22/15 1019 73     Resp 03/22/15 1019 22     Temp 03/22/15 1019  98.8 F (37.1 C)     Temp Source 03/22/15 1019 Oral     SpO2 03/22/15 1024 96 %     Weight 03/22/15 1019 209 lb (94.802 kg)     Height 03/22/15 1019 '5\' 6"'$  (1.676 m)     Head Cir --      Peak Flow --      Pain Score 03/22/15 1021 10     Pain Loc --      Pain Edu? --      Excl. in [place]? --     Constitutional: Alert and oriented. Mild distress Eyes: Conjunctivae are normal. PERRL. Normal extraocular movements. ENT   Head: Normocephalic and atraumatic.   Nose: No congestion/rhinnorhea.   Mouth/Throat: Mucous membranes are moist.   Neck: No stridor. Cardiovascular: Normal rate, regular rhythm. Normal and symmetric distal pulses are present in all extremities. No murmurs, rubs, or gallops. Respiratory: Normal respiratory effort with bilateral wheezing Gastrointestinal: Soft and nontender. No distention. No abdominal bruits.  Musculoskeletal: Nontender with normal range of motion in all extremities.  Neurologic:  Normal speech and language. No gross focal neurologic deficits are appreciated.  Skin:  Skin  is warm, dry and intact. No rash noted. ____________________________________________  EKG: Interpreted by me. Sinus rhythm with a rate of 81 bpm, normal PR interval, normal QRS, normal QT interval. Low voltage.  ____________________________________________  ED COURSE:  Pertinent labs & imaging results that were available during my care of the patient were reviewed by me and considered in my medical decision making (see chart for details). Patient with dyspnea, recent pneumonia. I will check basic labs and chest x-ray. ____________________________________________    LABS (pertinent positives/negatives)  Labs Reviewed  CBC WITH DIFFERENTIAL/PLATELET - Abnormal; Notable for the following:    RBC 3.49 (*)    Hemoglobin 10.7 (*)    HCT 32.4 (*)    RDW 16.8 (*)    Lymphs Abs 0.5 (*)    All other components within normal limits  BASIC METABOLIC PANEL - Abnormal; Notable  for the following:    Sodium 133 (*)    CO2 21 (*)    Calcium 8.0 (*)    All other components within normal limits  BRAIN NATRIURETIC PEPTIDE  TROPONIN I    RADIOLOGY Images were viewed by me  Chest x-ray IMPRESSION: Increasing right-sided pleural effusion.  Repeat chest x-ray reveals improved right pleural effusion. ____________________________________________  FINAL ASSESSMENT AND PLAN  Dyspnea, Pleural effusion status post thoracentesis  Plan: Patient with labs and imaging as dictated above. I discussed with pulmonology and interventional radiology who will arrange to have a thoracentesis for the patient. We have sent pleural fluid for cytology and other testing. She has outpatient follow-up scheduled with pulmonology. Patient agrees with plan.   Earleen Newport, MD   Earleen Newport, MD 03/22/15 [place], MD 03/22/15 [phone]

## 2015-03-22 NOTE — Telephone Encounter (Signed)
Patient called this morning returning Amber's call. But since Amber was not in yet, I took the call. I asked patient what was going on and she stated that she having difficulty breathing. She stated that she wheezes, stays out breath, has low grade fever (99.9 F) and when she coughs her abdomen hurts. Due to her symptoms, I recommended for her to go to the emergency room to be seen. Patient stated that she would call the ambulance for them to bring her to the hospital. I told her that I would keep an eye on her chart to make sure she was taken care of.

## 2015-03-22 NOTE — ED Notes (Signed)
Pt returned from u/s

## 2015-03-22 NOTE — Discharge Instructions (Signed)
Pleural Effusion A pleural effusion is an abnormal buildup of fluid in the layers of tissue between your lungs and the inside of your chest (pleural space). These two layers of tissue that line both your lungs and the inside of your chest are called pleura. Usually, there is no air in the space between the pleura, only a thin layer of fluid. If left untreated, a large amount of fluid can build up and cause the lung to collapse. A pleural effusion is usually caused by another disease that requires treatment. The two main types of pleural effusion are:  Transudative pleural effusion. This happens when fluid leaks into the pleural space because of a low protein count in your blood or high blood pressure in your vessels. Heart failure often causes this.  Exudative infusion. This occurs when fluid collects in the pleural space from blocked blood vessels or lymph vessels. Some lung diseases, injuries, and cancers can cause this type of effusion. CAUSES Pleural effusion can be caused by:  Heart failure.  A blood clot in the lung (pulmonary embolism).  Pneumonia.  Cancer.  Liver failure (cirrhosis).  Kidney disease.  Complications from surgery, such as from open heart surgery. SIGNS AND SYMPTOMS In some cases, pleural effusion may cause no symptoms. Symptoms can include:  Shortness of breath, especially when lying down.  Chest pain, often worse when taking a deep breath.  Fever.  Dry cough that is lasting (chronic).  Hiccups.  Rapid breathing. An underlying condition that is causing the pleural effusion (such as heart failure, pneumonia, blood clots, tuberculosis, or cancer) may also cause additional symptoms. DIAGNOSIS Your health care provider may suspect pleural effusion based on your symptoms and medical history. Your health care provider will also do a physical exam and a chest X-ray. If the X-ray shows there is fluid in your chest, you may need to have this fluid removed using a  needle (thoracentesis) so it can be tested. You may also have:  Imaging studies of the chest, such as:  Ultrasound.  CT scan.  Blood tests for kidney and liver function. TREATMENT Treatment depends on the cause of the pleural effusion. Treatment may include:  Taking antibiotic medicines to clear up an infection that is causing the pleural effusion.  Placing a tube in the chest to drain the effusion (tube thoracostomy). This procedure is often used when there is an infection in the fluid.  Surgery to remove the fibrous outer layer of tissue from the pleural space (decortication).  Thoracentesis, which can improve cough and shortness of breath.  A procedure to put medicine into the chest cavity to seal the pleural space to prevent fluid buildup (pleurodesis).  Chemotherapy and radiation therapy. These may be required in the case of cancerous (malignant) pleural effusion. HOME CARE INSTRUCTIONS  Take medicines only as directed by your health care provider.  Keep track of how long you can gently exercise before you get short of breath. Try simply walking at first.  Do not use any tobacco products, including cigarettes, chewing tobacco, or electronic cigarettes. If you need help quitting, ask your health care provider.  Keep all follow-up visits as directed by your health care provider. This is important. SEEK MEDICAL CARE IF:  The amount of time that you are able to exercise decreases or does not improve with time.  You have pain or signs of infection at the puncture site if you had thoracentesis. Watch for:  Drainage.  Redness.  Swelling.  You have a fever.  SEEK IMMEDIATE MEDICAL CARE IF:  You are short of breath.  You develop chest pain.  You develop a new cough. MAKE SURE YOU:  Understand these instructions.  Will watch your condition.  Will get help right away if you are not doing well or get worse.   This information is not intended to replace advice  given to you by your health care provider. Make sure you discuss any questions you have with your health care provider.   Document Released: 12/23/2004 Document Revised: 01/13/2014 Document Reviewed: 05/18/2013 Elsevier Interactive Patient Education [employer].

## 2015-03-22 NOTE — ED Notes (Signed)
Assisted pt with using the bathroom. She used a bedpan. A sample of urine was set on the counter incase they need it.

## 2015-03-22 NOTE — ED Notes (Signed)
Pt to u/s for thoracentesis.

## 2015-03-22 NOTE — Procedures (Signed)
US guided right thoracentesis.  Removed 1600 ml of bloody fluid. No immediate complication. CXR to follow.

## 2015-03-23 ENCOUNTER — Inpatient Hospital Stay: Payer: Commercial Managed Care - HMO | Admitting: Hematology and Oncology

## 2015-03-23 LAB — CYTOLOGY - NON PAP

## 2015-03-26 LAB — CULTURE, BODY FLUID-BOTTLE: CULTURE: NO GROWTH

## 2015-03-26 LAB — CULTURE, BODY FLUID W GRAM STAIN -BOTTLE

## 2015-03-29 ENCOUNTER — Telehealth: Payer: Self-pay

## 2015-03-29 NOTE — Telephone Encounter (Signed)
Patient called asking if [name] could write her a prescription for pain medication.  Patient stated that since her pain isn't coming from the cancer, it's coming from a fractured vertebra, her Oncologist isn't prescribing the pain medication anymore.  She has an appointment with Dr. Dossie Arbour 04/30/2015 at [facility].   Patient wants to know if [name] could prescribe her enough pain medication to get her through till her appointment with Dr. Dossie Arbour.  Patient states she is in a lot of pain with her back.   Routing to provider and [place].

## 2015-03-30 NOTE — Telephone Encounter (Signed)
What is she taking and how many times a day?

## 2015-03-30 NOTE — Telephone Encounter (Signed)
Patient returned call and stated she has been taking Oxycodone 10/325 every 4 to 6 hours for back pain. She states that the previous doctor will not write the pain medication for her anymore because the pain is not associated with cancer. She states she has multiple fractured vertebra. I let the patient know that if Malachy Mood chooses to write this, that she would have to come by to pick it up and take to her pharmacy to get filled. Patient stated she understands this.

## 2015-03-30 NOTE — Telephone Encounter (Signed)
Called and left patient a voicemail asking if she could please return my call.

## 2015-03-30 NOTE — Telephone Encounter (Signed)
Called and let patient know what [name] said. I confirmed with patient that she has an appointment with [name] Tuesday. I told the patient that this could be discussed at that appointment and patient stated she would see [name] then.

## 2015-03-30 NOTE — Telephone Encounter (Signed)
Routing to provider  

## 2015-03-30 NOTE — Telephone Encounter (Signed)
If I was to prescribe this, it would have to be a face to face visit.  I would not prescribe anymore that 3 a day.

## 2015-04-01 ENCOUNTER — Encounter: Payer: Self-pay | Admitting: Hematology and Oncology

## 2015-04-03 ENCOUNTER — Encounter: Payer: Self-pay | Admitting: Unknown Physician Specialty

## 2015-04-03 ENCOUNTER — Ambulatory Visit (INDEPENDENT_AMBULATORY_CARE_PROVIDER_SITE_OTHER): Payer: Commercial Managed Care - HMO | Admitting: Unknown Physician Specialty

## 2015-04-03 VITALS — BP 111/78 | HR 109 | Temp 99.3°F | Ht 65.1 in | Wt 190.2 lb

## 2015-04-03 DIAGNOSIS — M545 Low back pain: Secondary | ICD-10-CM | POA: Diagnosis not present

## 2015-04-03 DIAGNOSIS — Z09 Encounter for follow-up examination after completed treatment for conditions other than malignant neoplasm: Secondary | ICD-10-CM | POA: Diagnosis not present

## 2015-04-03 DIAGNOSIS — G4700 Insomnia, unspecified: Secondary | ICD-10-CM

## 2015-04-03 DIAGNOSIS — R630 Anorexia: Secondary | ICD-10-CM | POA: Diagnosis not present

## 2015-04-03 MED ORDER — TRAZODONE HCL 50 MG PO TABS: 50.0000 mg | ORAL_TABLET | Freq: Every evening | ORAL | Status: DC | PRN

## 2015-04-03 MED ORDER — OXYCODONE-ACETAMINOPHEN 10-325 MG PO TABS: 1.0000 | ORAL_TABLET | Freq: Three times a day (TID) | ORAL | Status: DC | PRN

## 2015-04-03 NOTE — Assessment & Plan Note (Addendum)
Pt was given 150 mg trazodone prescription from ER visitation and expressed the medication was too strong for her. Given a new prescription for 50 mg Trazodone.

## 2015-04-03 NOTE — Patient Instructions (Addendum)
Please call [name] at [place] at [phone] to set up home health care.

## 2015-04-03 NOTE — Assessment & Plan Note (Addendum)
Pt no longer receiving percocet from her Oncologist for persistent pain. Pt is schedule with the pain clinic later in April to initiate appropriate pain management regimen. Pt was given a prescription as a bridge until follow-up with the pain clinic.   [place] Percocet #60 to take no more that TID.

## 2015-04-03 NOTE — Assessment & Plan Note (Addendum)
Pt presented for follow-up from ER visitation 03/22/15. Pt is doing well and reports improvement in respiratory symptoms. SOB controlled with inhalers. Pt has a pending follow-up with pulmonology.  home health has been trying to contact her.  Gave her the number to call.

## 2015-04-03 NOTE — Progress Notes (Signed)
BP 111/78 mmHg  Pulse 109  Temp(Src) 99.3 F (37.4 C)  Ht 5' 5.1" (1.654 m)  Wt 190 lb 3.2 oz (86.274 kg)  BMI 31.54 kg/m2  SpO2 94%   Subjective:    Patient ID: [name], female    DOB: 01-24-1955, 60 y.o.   MRN: [id number]  HPI: [name] is a 60 y.o. female  Chief Complaint  Patient presents with  . Hospitalization Follow-up    pt states she was seen at ED for SOB and had fluid drained off lungs  . Back Pain    pt states she has been having a lot of back pain, states she has some fractured vertebra   Hospital Follow-Up: Pt states that she was seen in the local ER (03/22/15) for right chest pain and axillary pain with SOB.  Pt described the pain as a sharp shooting pain radiating to her back and abdomen.  Pt was told she had "massive fluid" around her lungs and 2 liters of fluid were removed from the right lung.  Pt reports residual pain. Pt states the pain has coupled with her back pain Pt states that the pain is worsened with coughing.  Pt denies difficulty with breathing with the use of daily inhalers.  Pt scheduled for a follow-up with pulmonology.   Back Pain: Pt is having severe back pain, constant, 8/10, and worsened with movement.  Pt states that the pain starts at the right scapula radiates down the right side and then radiates over to the left flank. Reports reduced appetite over the last 4-5 days due to her pain and discomfort. Pt is primarily taking in fluids to stay hydrated.   Pt no longer receives Percocet from her Oncologist. States her last Percocet was taken 4 days ago.  Pt was instructed to follow-up with a pain clinic for persistent pain. Pt is scheduled with the pain clinic April 24   Insomnia: Pt reports insomnia and difficulty with sleeping due to her pain level. Pt was prescribed Trazodone 150 mg from hospital ER visitation. Per pt the amount is too strong for her; makes her sleep throughout the day. Pt requested to have the dose  lowered.   Relevant past medical, surgical, family and social history reviewed and updated as indicated. Interim medical history since our last visit reviewed. Allergies and medications reviewed and updated.  Review of Systems  Constitutional: Positive for activity change (reduced physical activity due to back pain), appetite change and fatigue. Negative for unexpected weight change.  HENT: Negative.   Eyes: Negative.   Respiratory: Positive for cough (productive with white sputum). Negative for chest tightness, shortness of breath and wheezing.   Cardiovascular: Negative.  Negative for chest pain, palpitations and leg swelling.  Gastrointestinal: Negative.  Negative for abdominal pain, constipation and rectal pain.  Genitourinary: Negative.  Negative for urgency, frequency and difficulty urinating.  Musculoskeletal: Positive for back pain.  Skin: Negative.   Neurological: Negative.  Negative for dizziness, weakness and light-headedness.  Psychiatric/Behavioral: Positive for sleep disturbance.   Per HPI unless specifically indicated above     Objective:    BP 111/78 mmHg  Pulse 109  Temp(Src) 99.3 F (37.4 C)  Ht 5' 5.1" (1.654 m)  Wt 190 lb 3.2 oz (86.274 kg)  BMI 31.54 kg/m2  SpO2 94%  Wt Readings from Last 3 Encounters:  04/03/15 190 lb 3.2 oz (86.274 kg)  03/22/15 209 lb (94.802 kg)  03/15/15 221 lb (100.245 kg)  Physical Exam  Constitutional: She is oriented to person, place, and time. She appears well-developed and well-nourished. No distress.  HENT:  Head: Normocephalic and atraumatic.  Eyes: Conjunctivae and EOM are normal. Pupils are equal, round, and reactive to light.  Neck: Normal range of motion. Neck supple.  Cardiovascular: Normal rate, regular rhythm and normal heart sounds.   Pulmonary/Chest: Effort normal. No respiratory distress. She has wheezes (noted wheezing on the right upper and middle lobe ). She exhibits no tenderness.  Abdominal: Soft. Bowel  sounds are normal. There is no tenderness.  Musculoskeletal: Normal range of motion. She exhibits tenderness (back pain).  Neurological: She is alert and oriented to person, place, and time.  Skin: Skin is warm and dry.  Psychiatric: She has a normal mood and affect. Her behavior is normal. Judgment and thought content normal.  Nursing note and vitals reviewed.   Results for orders placed or performed during the hospital encounter of 03/22/15  Culture, body fluid-bottle  Result Value Ref Range   Specimen Description PLEURAL    Special Requests NONE    Gram Stain      MODERATE RED BLOOD CELLS RARE WBC SEEN NO ORGANISMS SEEN    Culture NO GROWTH 4 DAYS    Report Status 03/26/2015 FINAL   CBC with Differential  Result Value Ref Range   WBC 6.7 3.6 - 11.0 K/uL   RBC 3.49 (L) 3.80 - 5.20 MIL/uL   Hemoglobin 10.7 (L) 12.0 - 16.0 g/dL   HCT 32.4 (L) 35.0 - 47.0 %   MCV 92.9 80.0 - 100.0 fL   MCH 30.6 26.0 - 34.0 pg   MCHC 33.0 32.0 - 36.0 g/dL   RDW 16.8 (H) 11.5 - 14.5 %   Platelets 185 150 - 440 K/uL   Neutrophils Relative % 83 %   Neutro Abs 5.5 1.4 - 6.5 K/uL   Lymphocytes Relative 7 %   Lymphs Abs 0.5 (L) 1.0 - 3.6 K/uL   Monocytes Relative 6 %   Monocytes Absolute 0.4 0.2 - 0.9 K/uL   Eosinophils Relative 4 %   Eosinophils Absolute 0.3 0 - 0.7 K/uL   Basophils Relative 0 %   Basophils Absolute 0.0 0 - 0.1 K/uL  Basic metabolic panel  Result Value Ref Range   Sodium 133 (L) 135 - 145 mmol/L   Potassium 3.8 3.5 - 5.1 mmol/L   Chloride 105 101 - 111 mmol/L   CO2 21 (L) 22 - 32 mmol/L   Glucose, Bld 93 65 - 99 mg/dL   BUN 11 6 - 20 mg/dL   Creatinine, Ser 0.79 0.44 - 1.00 mg/dL   Calcium 8.0 (L) 8.9 - 10.3 mg/dL   GFR calc non Af Amer >60 >60 mL/min   GFR calc Af Amer >60 >60 mL/min   Anion gap 7 5 - 15  Brain natriuretic peptide  Result Value Ref Range   B Natriuretic Peptide 80.0 0.0 - 100.0 pg/mL  Troponin I  Result Value Ref Range   Troponin I <0.03 <0.031  ng/mL  Lactate dehydrogenase (CSF, pleural or peritoneal fluid)  Result Value Ref Range   LD, Fluid 456 (H) 3 - 23 U/L   Fluid Type-FLDH CYTOPLEU   Protein, pleural or peritoneal fluid  Result Value Ref Range   Total protein, fluid <3.0 g/dL   Fluid Type-FTP CYTOPLEU   Body fluid cell count with differential  Result Value Ref Range   Fluid Type-FCT CYTOPLEU    Color, Fluid RED (A) YELLOW  Appearance, Fluid CLOUDY (A) CLEAR   WBC, Fluid 874 cu mm   Neutrophil Count, Fluid 61 %   Lymphs, Fluid 8 %   Monocyte-Macrophage-Serous Fluid 11 %   Eos, Fluid 20 %   Other Cells, Fluid 0 %  Glucose, pleural or peritoneal fluid  Result Value Ref Range   Glucose, Fluid 94 mg/dL   Fluid Type-FGLU CYTOPLEU   PH, Body Fluid  Result Value Ref Range   pH, Body Fluid 7.9 Not Estab.   Source of Sample PENDING   Cytology - Non PAP;  Result Value Ref Range   CYTOLOGY - NON GYN      Cytology - Non PAP CASE: [id number] PATIENT: Gertude Antonellis Non-Gyn Cytology Report     SPECIMEN SUBMITTED: A. Pleural fluid  CLINICAL HISTORY: None Provided  PRE-OPERATIVE DIAGNOSIS: None provided  POST-OPERATIVE DIAGNOSIS: None provided.     DIAGNOSIS: A. RIGHT PLEURAL FLUID; ULTRASOUND-GUIDED THORACENTESIS: - NEGATIVE FOR MALIGNANT CELLS. - BLOOD AND NONSPECIFIC INFLAMMATION.  Note: A cell block was included the interpretation of this case.   GROSS DESCRIPTION: A. Site: Right pleural fluid      Volume: 600 mL      Description: Hemorrhagic fluid      Submitted for:           ThinPrep           Cell block(s): 1   Final Diagnosis performed by Delorse Lek, MD.  Electronically signed 03/23/2015 3:33:22PM    The electronic signature indicates that the named Attending Pathologist has evaluated the specimen  Technical component performed at [facility], [address] [place], [place] [address] Lab: [phone] Dir: Darrick Penna. Evette Doffing, MD  Pr ofessional component performed at [facility],  [facility], [place], [place], [place] [address] Lab: [phone] Dir: Dellia Nims. Rubinas, MD        Assessment & Plan:   Problem List Items Addressed This Visit      Unprioritized   LBP (low back pain) - Primary    Pt no longer receiving percocet from her Oncologist for persistent pain. Pt is schedule with the pain clinic later in April to initiate appropriate pain management regimen. Pt was given a prescription as a bridge until follow-up with the pain clinic.   Wrot Percocet #60 to take no more that TID.        Relevant Medications   oxyCODONE-acetaminophen (PERCOCET) 10-325 MG tablet   Insomnia    Pt was given 150 mg trazodone prescription from ER visitation and expressed the medication was too strong for her. Given a new prescription for 50 mg Trazodone.       Hospital discharge follow-up    Pt presented for follow-up from ER visitation 03/22/15. Pt is doing well and reports improvement in respiratory symptoms. SOB controlled with inhalers. Pt has a pending follow-up with pulmonology.  home health has been trying to contact her.  Gave her the number to call.       Other Visit Diagnoses    Loss of appetite        Discussed diet.          Follow up plan: Return if symptoms worsen or fail to improve.

## 2015-04-05 ENCOUNTER — Telehealth: Payer: Self-pay | Admitting: *Deleted

## 2015-04-05 NOTE — Telephone Encounter (Signed)
Patietn never picked up her prescription for Oxycodone dated 3/13, it has been shredded

## 2015-04-06 ENCOUNTER — Encounter: Payer: Self-pay | Admitting: Internal Medicine

## 2015-04-06 ENCOUNTER — Ambulatory Visit
Admission: RE | Admit: 2015-04-06 | Discharge: 2015-04-06 | Disposition: A | Discharge status: Home / Self Care | Payer: Commercial Managed Care - HMO | Source: Ambulatory Visit | Attending: Pulmonary Disease | Admitting: Pulmonary Disease

## 2015-04-06 ENCOUNTER — Ambulatory Visit
Admission: RE | Admit: 2015-04-06 | Discharge: 2015-04-06 | Disposition: A | Discharge status: Home / Self Care | Payer: Commercial Managed Care - HMO | Source: Ambulatory Visit | Attending: Internal Medicine | Admitting: Internal Medicine

## 2015-04-06 ENCOUNTER — Telehealth: Payer: Self-pay

## 2015-04-06 ENCOUNTER — Ambulatory Visit (INDEPENDENT_AMBULATORY_CARE_PROVIDER_SITE_OTHER): Payer: Commercial Managed Care - HMO | Admitting: Internal Medicine

## 2015-04-06 ENCOUNTER — Encounter (INDEPENDENT_AMBULATORY_CARE_PROVIDER_SITE_OTHER): Payer: Self-pay

## 2015-04-06 VITALS — BP 142/88 | HR 86 | Ht 65.0 in | Wt 193.0 lb

## 2015-04-06 DIAGNOSIS — R0602 Shortness of breath: Secondary | ICD-10-CM | POA: Diagnosis not present

## 2015-04-06 DIAGNOSIS — J9811 Atelectasis: Secondary | ICD-10-CM

## 2015-04-06 DIAGNOSIS — J90 Pleural effusion, not elsewhere classified: Secondary | ICD-10-CM

## 2015-04-06 DIAGNOSIS — C782 Secondary malignant neoplasm of pleura: Secondary | ICD-10-CM | POA: Diagnosis not present

## 2015-04-06 DIAGNOSIS — C210 Malignant neoplasm of anus, unspecified: Secondary | ICD-10-CM | POA: Diagnosis not present

## 2015-04-06 NOTE — Progress Notes (Signed)
PULMONARY CONSULT NOTE  Requesting MD/Service: [name], NP Date of initial consultation: 02/07/15 Reason for consultation: COPD with recent exacerbation and persistent dyspnea  PT PROFILE: 60 y.o. F smoker with hx of COPD initially evaluated in [place] clinic 02/07/15 for persistent increase in dyspnea after recent AECOPD CC: SOB with exertion  HPI:  60 y.o. F with COPD who has class 2 dyspnea @ baseline referred for increased SOB/DOE of 3 wks duration.  Patient has pleural effusion drained by [name] approx 1.6 L bloody fluid on 3/16. WBC count was around 800, LDH count was 456 c/w exudative effusion CT chest on 10/22/14 at the time of her RUE DVT and this revealed no PE. There was an irregular, pleural based RLL opacity.  Patient with CXR today that  shows recurrent effusion on RT side  PATIENT REFUSES TO HAVE THORACENTESIS TODAY,   Patient states that she feels fine, no fever, chills, NVD. Patient breathing and talking comfortably at rest, her main complaint is with exertion   ROS: No fever, myalgias/arthralgias, unexplained weight loss or weight gain No new focal weakness or sensory deficits No otalgia, hearing loss, visual changes, nasal and sinus symptoms, mouth and throat problems No neck pain or adenopathy No abdominal pain, N/V/D, diarrhea, change in bowel pattern No dysuria, change in urinary pattern No LE edema or calf tenderness +DOE  BP 142/88 mmHg  Pulse 86  Ht '5\' 5"'$  (1.651 m)  Wt 193 lb (87.544 kg)  BMI 32.12 kg/m2  SpO2 93%   EXAM:  Gen: Appears much older than documented age, No overt respiratory distress HEENT: NCAT, TMs and canals normal, sclera white, nares and nasal mucosa normal, oropharynx normal Neck: Supple without LAN, thyromegaly, JVD Thorax, Lungs: moderate kyphosis, breath sounds markedly diminished RT side, decreased BS Cardiovascular: Normal rate, reg rhythm, no murmurs noted Abdomen: Soft, nontender, normal BS Ext: tobacco-stained nails. No  clubbing, cyanosis, edema Neuro: CNs grossly intact, motor and sensory intact, DTRs symmetric Skin: Limited exam, no lesions noted   CXR:  MODERATE SIZED PLEURAL EFFUSION RT SIDE  CT chest 10/22/14: irregular, pleural based RLL opacity  IMPRESSION:  60 yo white female with COPD with recurrent pleural effusion, After further evaluation, patient will need further assessment of the effusion. AT this time, etiology is unclear for this bloody exudative effusion. Must assess for malignancy in the setting of previous h/o ANAL cancer  I HAVE DISCUSSED CASE TH DR. Genevive Bi AND I RECOMMEND THOROCOSCOPY WITH PLEURAL BIOPSY     PLAN:  1. Cont Spiriva and Symbicort. Continue PRN albuterol 2. CT surgical Evaluation for recurrent  Pleural effusion  I have personally obtained a history, examined the patient, evaluated Pertinent laboratory and Radio[employer]ic/imaging results, and  formulated the assessment and plan   The Patient requires high complexity decision making for assessment and support, frequent evaluation and titration of therapies.  Patient   satisfied with Plan of action and management. All questions answered  Corrin Parker, M.D.  [facility]  Medical Director [place] Director [facility] Cardio-Pulmonary Department

## 2015-04-06 NOTE — Patient Instructions (Signed)
Smoking Cessation, Tips for Success If you are ready to quit smoking, congratulations! You have chosen to help yourself be healthier. Cigarettes bring nicotine, tar, carbon monoxide, and other irritants into your body. Your lungs, heart, and blood vessels will be able to work better without these poisons. There are many different ways to quit smoking. Nicotine gum, nicotine patches, a nicotine inhaler, or nicotine nasal spray can help with physical craving. Hypnosis, support groups, and medicines help break the habit of smoking. WHAT THINGS CAN I DO TO MAKE QUITTING EASIER?  Here are some tips to help you quit for good:  Pick a date when you will quit smoking completely. Tell all of your friends and family about your plan to quit on that date.  Do not try to slowly cut down on the number of cigarettes you are smoking. Pick a quit date and quit smoking completely starting on that day.  Throw away all cigarettes.   Clean and remove all ashtrays from your home, work, and car.  On a card, write down your reasons for quitting. Carry the card with you and read it when you get the urge to smoke.  Cleanse your body of nicotine. Drink enough water and fluids to keep your urine clear or pale yellow. Do this after quitting to flush the nicotine from your body.  Learn to predict your moods. Do not let a bad situation be your excuse to have a cigarette. Some situations in your life might tempt you into wanting a cigarette.  Never have "just one" cigarette. It leads to wanting another and another. Remind yourself of your decision to quit.  Change habits associated with smoking. If you smoked while driving or when feeling stressed, try other activities to replace smoking. Stand up when drinking your coffee. Brush your teeth after eating. Sit in a different chair when you read the paper. Avoid alcohol while trying to quit, and try to drink fewer caffeinated beverages. Alcohol and caffeine may urge you to  smoke.  Avoid foods and drinks that can trigger a desire to smoke, such as sugary or spicy foods and alcohol.  Ask people who smoke not to smoke around you.  Have something planned to do right after eating or having a cup of coffee. For example, plan to take a walk or exercise.  Try a relaxation exercise to calm you down and decrease your stress. Remember, you may be tense and nervous for the first 2 weeks after you quit, but this will pass.  Find new activities to keep your hands busy. Play with a pen, coin, or rubber band. Doodle or draw things on paper.  Brush your teeth right after eating. This will help cut down on the craving for the taste of tobacco after meals. You can also try mouthwash.   Use oral substitutes in place of cigarettes. Try using lemon drops, carrots, cinnamon sticks, or chewing gum. Keep them handy so they are available when you have the urge to smoke.  When you have the urge to smoke, try deep breathing.  Designate your home as a nonsmoking area.  If you are a heavy smoker, ask your health care provider about a prescription for nicotine chewing gum. It can ease your withdrawal from nicotine.  Reward yourself. Set aside the cigarette money you save and buy yourself something nice.  Look for support from others. Join a support group or smoking cessation program. Ask someone at home or at work to help you with your plan   to quit smoking.  Always ask yourself, "Do I need this cigarette or is this just a reflex?" Tell yourself, "Today, I choose not to smoke," or "I do not want to smoke." You are reminding yourself of your decision to quit.  Do not replace cigarette smoking with electronic cigarettes (commonly called e-cigarettes). The safety of e-cigarettes is unknown, and some may contain harmful chemicals.  If you relapse, do not give up! Plan ahead and think about what you will do the next time you get the urge to smoke. HOW WILL I FEEL WHEN I QUIT SMOKING? You  may have symptoms of withdrawal because your body is used to nicotine (the addictive substance in cigarettes). You may crave cigarettes, be irritable, feel very hungry, cough often, get headaches, or have difficulty concentrating. The withdrawal symptoms are only temporary. They are strongest when you first quit but will go away within 10-14 days. When withdrawal symptoms occur, stay in control. Think about your reasons for quitting. Remind yourself that these are signs that your body is healing and getting used to being without cigarettes. Remember that withdrawal symptoms are easier to treat than the major diseases that smoking can cause.  Even after the withdrawal is over, expect periodic urges to smoke. However, these cravings are generally short lived and will go away whether you smoke or not. Do not smoke! WHAT RESOURCES ARE AVAILABLE TO HELP ME QUIT SMOKING? Your health care provider can direct you to community resources or hospitals for support, which may include:  Group support.  Education.  Hypnosis.  Therapy.   This information is not intended to replace advice given to you by your health care provider. Make sure you discuss any questions you have with your health care provider.   Document Released: 09/21/2003 Document Revised: 01/13/2014 Document Reviewed: 06/10/2012 Elsevier Interactive Patient Education 2016 Elsevier Inc. Chronic Obstructive Pulmonary Disease Chronic obstructive pulmonary disease (COPD) is a common lung condition in which airflow from the lungs is limited. COPD is a general term that can be used to describe many different lung problems that limit airflow, including both chronic bronchitis and emphysema. If you have COPD, your lung function will probably never return to normal, but there are measures you can take to improve lung function and make yourself feel better. CAUSES   Smoking (common).  Exposure to secondhand smoke.  Genetic problems.  Chronic  inflammatory lung diseases or recurrent infections. SYMPTOMS  Shortness of breath, especially with physical activity.  Deep, persistent (chronic) cough with a large amount of thick mucus.  Wheezing.  Rapid breaths (tachypnea).  Gray or bluish discoloration (cyanosis) of the skin, especially in your fingers, toes, or lips.  Fatigue.  Weight loss.  Frequent infections or episodes when breathing symptoms become much worse (exacerbations).  Chest tightness. DIAGNOSIS Your health care provider will take a medical history and perform a physical examination to diagnose COPD. Additional tests for COPD may include:  Lung (pulmonary) function tests.  Chest X-ray.  CT scan.  Blood tests. TREATMENT  Treatment for COPD may include:  Inhaler and nebulizer medicines. These help manage the symptoms of COPD and make your breathing more comfortable.  Supplemental oxygen. Supplemental oxygen is only helpful if you have a low oxygen level in your blood.  Exercise and physical activity. These are beneficial for nearly all people with COPD.  Lung surgery or transplant.  Nutrition therapy to gain weight, if you are underweight.  Pulmonary rehabilitation. This may involve working with a team of health  care providers and specialists, such as respiratory, occupational, and physical therapists. HOME CARE INSTRUCTIONS  Take all medicines (inhaled or pills) as directed by your health care provider.  Avoid over-the-counter medicines or cough syrups that dry up your airway (such as antihistamines) and slow down the elimination of secretions unless instructed otherwise by your health care provider.  If you are a smoker, the most important thing that you can do is stop smoking. Continuing to smoke will cause further lung damage and breathing trouble. Ask your health care provider for help with quitting smoking. He or she can direct you to community resources or hospitals that provide  support.  Avoid exposure to irritants such as smoke, chemicals, and fumes that aggravate your breathing.  Use oxygen therapy and pulmonary rehabilitation if directed by your health care provider. If you require home oxygen therapy, ask your health care provider whether you should purchase a pulse oximeter to measure your oxygen level at home.  Avoid contact with individuals who have a contagious illness.  Avoid extreme temperature and humidity changes.  Eat healthy foods. Eating smaller, more frequent meals and resting before meals may help you maintain your strength.  Stay active, but balance activity with periods of rest. Exercise and physical activity will help you maintain your ability to do things you want to do.  Preventing infection and hospitalization is very important when you have COPD. Make sure to receive all the vaccines your health care provider recommends, especially the pneumococcal and influenza vaccines. Ask your health care provider whether you need a pneumonia vaccine.  Learn and use relaxation techniques to manage stress.  Learn and use controlled breathing techniques as directed by your health care provider. Controlled breathing techniques include:  Pursed lip breathing. Start by breathing in (inhaling) through your nose for 1 second. Then, purse your lips as if you were going to whistle and breathe out (exhale) through the pursed lips for 2 seconds.  Diaphragmatic breathing. Start by putting one hand on your abdomen just above your waist. Inhale slowly through your nose. The hand on your abdomen should move out. Then purse your lips and exhale slowly. You should be able to feel the hand on your abdomen moving in as you exhale.  Learn and use controlled coughing to clear mucus from your lungs. Controlled coughing is a series of short, progressive coughs. The steps of controlled coughing are: 1. Lean your head slightly forward. 2. Breathe in deeply using diaphragmatic  breathing. 3. Try to hold your breath for 3 seconds. 4. Keep your mouth slightly open while coughing twice. 5. Spit any mucus out into a tissue. 6. Rest and repeat the steps once or twice as needed. SEEK MEDICAL CARE IF:  You are coughing up more mucus than usual.  There is a change in the color or thickness of your mucus.  Your breathing is more labored than usual.  Your breathing is faster than usual. SEEK IMMEDIATE MEDICAL CARE IF:  You have shortness of breath while you are resting.  You have shortness of breath that prevents you from:  Being able to talk.  Performing your usual physical activities.  You have chest pain lasting longer than 5 minutes.  Your skin color is more cyanotic than usual.  You measure low oxygen saturations for longer than 5 minutes with a pulse oximeter. MAKE SURE YOU:  Understand these instructions.  Will watch your condition.  Will get help right away if you are not doing well or get worse.  This information is not intended to replace advice given to you by your health care provider. Make sure you discuss any questions you have with your health care provider.   Document Released: 10/02/2004 Document Revised: 01/13/2014 Document Reviewed: 08/19/2012 Elsevier Interactive Patient Education [employer].

## 2015-04-06 NOTE — Telephone Encounter (Signed)
OK.  Also, can we get a referral to [facility] for her?

## 2015-04-06 NOTE — Telephone Encounter (Signed)
Mickell from [facility] called and wanted to know if she could get a verbal for home health nursing orders since the patient was just recently in the hospital and for medication management.

## 2015-04-06 NOTE — Telephone Encounter (Signed)
Called and let Mickell know that Malachy Mood gave the OK for home health nursing orders. [facility] referral sheet filled out and faxed.

## 2015-04-08 ENCOUNTER — Inpatient Hospital Stay
Admission: EM | Admit: 2015-04-08 | Discharge: 2015-04-17 | DRG: 167 | Disposition: A | Discharge status: Home Health | Payer: Commercial Managed Care - HMO | Attending: Internal Medicine | Admitting: Internal Medicine

## 2015-04-08 ENCOUNTER — Encounter: Payer: Self-pay | Admitting: Emergency Medicine

## 2015-04-08 ENCOUNTER — Emergency Department: Payer: Commercial Managed Care - HMO

## 2015-04-08 DIAGNOSIS — J90 Pleural effusion, not elsewhere classified: Secondary | ICD-10-CM | POA: Diagnosis not present

## 2015-04-08 DIAGNOSIS — M797 Fibromyalgia: Secondary | ICD-10-CM | POA: Diagnosis present

## 2015-04-08 DIAGNOSIS — J441 Chronic obstructive pulmonary disease with (acute) exacerbation: Secondary | ICD-10-CM | POA: Diagnosis present

## 2015-04-08 DIAGNOSIS — G4733 Obstructive sleep apnea (adult) (pediatric): Secondary | ICD-10-CM | POA: Diagnosis present

## 2015-04-08 DIAGNOSIS — R188 Other ascites: Secondary | ICD-10-CM | POA: Diagnosis present

## 2015-04-08 DIAGNOSIS — G8929 Other chronic pain: Secondary | ICD-10-CM | POA: Diagnosis present

## 2015-04-08 DIAGNOSIS — Z79899 Other long term (current) drug therapy: Secondary | ICD-10-CM

## 2015-04-08 DIAGNOSIS — E876 Hypokalemia: Secondary | ICD-10-CM | POA: Diagnosis present

## 2015-04-08 DIAGNOSIS — D509 Iron deficiency anemia, unspecified: Secondary | ICD-10-CM | POA: Diagnosis present

## 2015-04-08 DIAGNOSIS — Z85048 Personal history of other malignant neoplasm of rectum, rectosigmoid junction, and anus: Secondary | ICD-10-CM

## 2015-04-08 DIAGNOSIS — Z9889 Other specified postprocedural states: Secondary | ICD-10-CM | POA: Diagnosis not present

## 2015-04-08 DIAGNOSIS — C782 Secondary malignant neoplasm of pleura: Secondary | ICD-10-CM | POA: Diagnosis present

## 2015-04-08 DIAGNOSIS — Z818 Family history of other mental and behavioral disorders: Secondary | ICD-10-CM | POA: Diagnosis not present

## 2015-04-08 DIAGNOSIS — Z8249 Family history of ischemic heart disease and other diseases of the circulatory system: Secondary | ICD-10-CM

## 2015-04-08 DIAGNOSIS — Z833 Family history of diabetes mellitus: Secondary | ICD-10-CM

## 2015-04-08 DIAGNOSIS — J449 Chronic obstructive pulmonary disease, unspecified: Secondary | ICD-10-CM | POA: Diagnosis not present

## 2015-04-08 DIAGNOSIS — Z923 Personal history of irradiation: Secondary | ICD-10-CM | POA: Diagnosis not present

## 2015-04-08 DIAGNOSIS — I10 Essential (primary) hypertension: Secondary | ICD-10-CM | POA: Diagnosis present

## 2015-04-08 DIAGNOSIS — Z09 Encounter for follow-up examination after completed treatment for conditions other than malignant neoplasm: Secondary | ICD-10-CM

## 2015-04-08 DIAGNOSIS — Z9221 Personal history of antineoplastic chemotherapy: Secondary | ICD-10-CM

## 2015-04-08 DIAGNOSIS — J9589 Other postprocedural complications and disorders of respiratory system, not elsewhere classified: Secondary | ICD-10-CM | POA: Diagnosis present

## 2015-04-08 DIAGNOSIS — R0602 Shortness of breath: Secondary | ICD-10-CM

## 2015-04-08 DIAGNOSIS — Z809 Family history of malignant neoplasm, unspecified: Secondary | ICD-10-CM | POA: Diagnosis not present

## 2015-04-08 DIAGNOSIS — Z86718 Personal history of other venous thrombosis and embolism: Secondary | ICD-10-CM | POA: Diagnosis not present

## 2015-04-08 DIAGNOSIS — J910 Malignant pleural effusion: Secondary | ICD-10-CM | POA: Diagnosis present

## 2015-04-08 DIAGNOSIS — F17200 Nicotine dependence, unspecified, uncomplicated: Secondary | ICD-10-CM | POA: Diagnosis present

## 2015-04-08 DIAGNOSIS — F319 Bipolar disorder, unspecified: Secondary | ICD-10-CM | POA: Diagnosis present

## 2015-04-08 DIAGNOSIS — Z8701 Personal history of pneumonia (recurrent): Secondary | ICD-10-CM

## 2015-04-08 DIAGNOSIS — J45909 Unspecified asthma, uncomplicated: Secondary | ICD-10-CM | POA: Diagnosis present

## 2015-04-08 DIAGNOSIS — Z933 Colostomy status: Secondary | ICD-10-CM

## 2015-04-08 DIAGNOSIS — Z9851 Tubal ligation status: Secondary | ICD-10-CM | POA: Diagnosis not present

## 2015-04-08 DIAGNOSIS — K219 Gastro-esophageal reflux disease without esophagitis: Secondary | ICD-10-CM | POA: Diagnosis present

## 2015-04-08 DIAGNOSIS — Z841 Family history of disorders of kidney and ureter: Secondary | ICD-10-CM | POA: Diagnosis not present

## 2015-04-08 DIAGNOSIS — R0902 Hypoxemia: Secondary | ICD-10-CM | POA: Diagnosis present

## 2015-04-08 DIAGNOSIS — C7800 Secondary malignant neoplasm of unspecified lung: Secondary | ICD-10-CM | POA: Diagnosis not present

## 2015-04-08 DIAGNOSIS — Z823 Family history of stroke: Secondary | ICD-10-CM

## 2015-04-08 DIAGNOSIS — C20 Malignant neoplasm of rectum: Secondary | ICD-10-CM | POA: Diagnosis not present

## 2015-04-08 DIAGNOSIS — R079 Chest pain, unspecified: Secondary | ICD-10-CM

## 2015-04-08 DIAGNOSIS — Z716 Tobacco abuse counseling: Secondary | ICD-10-CM

## 2015-04-08 DIAGNOSIS — Z882 Allergy status to sulfonamides status: Secondary | ICD-10-CM

## 2015-04-08 DIAGNOSIS — E871 Hypo-osmolality and hyponatremia: Secondary | ICD-10-CM | POA: Diagnosis present

## 2015-04-08 DIAGNOSIS — I82729 Chronic embolism and thrombosis of deep veins of unspecified upper extremity: Secondary | ICD-10-CM | POA: Diagnosis present

## 2015-04-08 LAB — COMPREHENSIVE METABOLIC PANEL
ALT: 15 U/L (ref 14–54)
ANION GAP: 6 (ref 5–15)
AST: 43 U/L — ABNORMAL HIGH (ref 15–41)
Albumin: 2.1 g/dL — ABNORMAL LOW (ref 3.5–5.0)
Alkaline Phosphatase: 135 U/L — ABNORMAL HIGH (ref 38–126)
BUN: 8 mg/dL (ref 6–20)
CHLORIDE: 100 mmol/L — AB (ref 101–111)
CO2: 27 mmol/L (ref 22–32)
Calcium: 7.7 mg/dL — ABNORMAL LOW (ref 8.9–10.3)
Creatinine, Ser: 0.76 mg/dL (ref 0.44–1.00)
GFR calc non Af Amer: >60 mL/min (ref >60)
Glucose, Bld: 116 mg/dL — ABNORMAL HIGH (ref 65–99)
Potassium: 3 mmol/L — ABNORMAL LOW (ref 3.5–5.1)
SODIUM: 133 mmol/L — AB (ref 135–145)
Total Bilirubin: 0.9 mg/dL (ref 0.3–1.2)
Total Protein: 5.7 g/dL — ABNORMAL LOW (ref 6.5–8.1)

## 2015-04-08 LAB — CBC WITH DIFFERENTIAL/PLATELET
Basophils Absolute: 0 10*3/uL (ref 0–0.1)
Basophils Relative: 1 %
EOS ABS: 0.7 10*3/uL (ref 0–0.7)
EOS PCT: 10 %
HCT: 27.1 % — ABNORMAL LOW (ref 35.0–47.0)
Hemoglobin: 9 g/dL — ABNORMAL LOW (ref 12.0–16.0)
LYMPHS ABS: 0.6 10*3/uL — AB (ref 1.0–3.6)
Lymphocytes Relative: 9 %
MCH: 29.1 pg (ref 26.0–34.0)
MCHC: 33.1 g/dL (ref 32.0–36.0)
MCV: 88.1 fL (ref 80.0–100.0)
Monocytes Absolute: 0.7 10*3/uL (ref 0.2–0.9)
Monocytes Relative: 9 %
Neutro Abs: 5.3 10*3/uL (ref 1.4–6.5)
Neutrophils Relative %: 71 %
PLATELETS: 246 10*3/uL (ref 150–440)
RBC: 3.08 MIL/uL — AB (ref 3.80–5.20)
RDW: 16.8 % — ABNORMAL HIGH (ref 11.5–14.5)
WBC: 7.3 10*3/uL (ref 3.6–11.0)

## 2015-04-08 LAB — TROPONIN I: Troponin I: <0.03 ng/mL (ref <0.031)

## 2015-04-08 LAB — PROTIME-INR
INR: 1.13
Prothrombin Time: 14.7 seconds (ref 11.4–15.0)

## 2015-04-08 LAB — APTT: APTT: 35 s (ref 24–36)

## 2015-04-08 MED ORDER — TIOTROPIUM BROMIDE MONOHYDRATE 18 MCG IN CAPS
18.0000 ug | ORAL_CAPSULE | Freq: Every day | RESPIRATORY_TRACT | Status: DC
  Administered 2015-04-10 – 2015-04-17 (×8): 18 ug via RESPIRATORY_TRACT
  Filled 2015-04-08 (×2): qty 5

## 2015-04-08 MED ORDER — OXYCODONE-ACETAMINOPHEN 10-325 MG PO TABS: 1.0000 | ORAL_TABLET | Freq: Three times a day (TID) | ORAL | Status: DC | PRN

## 2015-04-08 MED ORDER — METHYLPREDNISOLONE SODIUM SUCC 125 MG IJ SOLR
125.0000 mg | Freq: Once | INTRAMUSCULAR | Status: AC
Start: 2015-04-08 — End: 2015-04-08
  Administered 2015-04-08: 125 mg via INTRAVENOUS
  Filled 2015-04-08: qty 2

## 2015-04-08 MED ORDER — ACETAMINOPHEN 650 MG RE SUPP: 650.0000 mg | Freq: Four times a day (QID) | RECTAL | Status: DC | PRN

## 2015-04-08 MED ORDER — HYDROMORPHONE HCL 1 MG/ML IJ SOLN
1.0000 mg | INTRAMUSCULAR | Status: DC | PRN
  Administered 2015-04-10 – 2015-04-15 (×12): 1 mg via INTRAVENOUS
  Filled 2015-04-08 (×13): qty 1

## 2015-04-08 MED ORDER — BACLOFEN 10 MG PO TABS: 10.0000 mg | ORAL_TABLET | Freq: Three times a day (TID) | ORAL | Status: DC | PRN

## 2015-04-08 MED ORDER — BISACODYL 10 MG RE SUPP: 10.0000 mg | Freq: Every day | RECTAL | Status: DC | PRN

## 2015-04-08 MED ORDER — OXYCODONE HCL 5 MG PO TABS
5.0000 mg | ORAL_TABLET | Freq: Three times a day (TID) | ORAL | Status: DC | PRN
  Administered 2015-04-08 – 2015-04-17 (×7): 5 mg via ORAL
  Filled 2015-04-08 (×7): qty 1

## 2015-04-08 MED ORDER — PREGABALIN 75 MG PO CAPS: 200.0000 mg | ORAL_CAPSULE | Freq: Two times a day (BID) | ORAL | Status: DC

## 2015-04-08 MED ORDER — OXYCODONE-ACETAMINOPHEN 5-325 MG PO TABS
1.0000 | ORAL_TABLET | Freq: Three times a day (TID) | ORAL | Status: DC | PRN
  Administered 2015-04-08 – 2015-04-16 (×11): 1 via ORAL
  Filled 2015-04-08 (×11): qty 1

## 2015-04-08 MED ORDER — PIPERACILLIN-TAZOBACTAM 3.375 G IVPB
3.3750 g | Freq: Three times a day (TID) | INTRAVENOUS | Status: DC
  Administered 2015-04-08 – 2015-04-10 (×6): 3.375 g via INTRAVENOUS
  Filled 2015-04-08 (×8): qty 50

## 2015-04-08 MED ORDER — POTASSIUM CHLORIDE IN NACL 40-0.9 MEQ/L-% IV SOLN
INTRAVENOUS | Status: DC
  Administered 2015-04-08 – 2015-04-09 (×4): 75 mL/h via INTRAVENOUS
  Filled 2015-04-08 (×6): qty 1000

## 2015-04-08 MED ORDER — MOMETASONE FURO-FORMOTEROL FUM 200-5 MCG/ACT IN AERO
2.0000 | INHALATION_SPRAY | Freq: Two times a day (BID) | RESPIRATORY_TRACT | Status: DC
  Administered 2015-04-08 – 2015-04-17 (×17): 2 via RESPIRATORY_TRACT
  Filled 2015-04-08: qty 8.8

## 2015-04-08 MED ORDER — HEPARIN SODIUM (PORCINE) 5000 UNIT/ML IJ SOLN
5000.0000 [IU] | Freq: Three times a day (TID) | INTRAMUSCULAR | Status: DC
  Administered 2015-04-08 – 2015-04-09 (×3): 5000 [IU] via SUBCUTANEOUS
  Filled 2015-04-08 (×3): qty 1

## 2015-04-08 MED ORDER — IPRATROPIUM-ALBUTEROL 0.5-2.5 (3) MG/3ML IN SOLN
3.0000 mL | Freq: Four times a day (QID) | RESPIRATORY_TRACT | Status: DC
  Administered 2015-04-08 – 2015-04-11 (×11): 3 mL via RESPIRATORY_TRACT
  Filled 2015-04-08 (×12): qty 3

## 2015-04-08 MED ORDER — ASPIRIN EC 81 MG PO TBEC
81.0000 mg | DELAYED_RELEASE_TABLET | Freq: Every day | ORAL | Status: DC
  Administered 2015-04-09 – 2015-04-17 (×8): 81 mg via ORAL
  Filled 2015-04-08 (×10): qty 1

## 2015-04-08 MED ORDER — TRAZODONE HCL 50 MG PO TABS: 50.0000 mg | ORAL_TABLET | Freq: Every evening | ORAL | Status: DC | PRN

## 2015-04-08 MED ORDER — ACETAMINOPHEN 325 MG PO TABS: 650.0000 mg | ORAL_TABLET | Freq: Four times a day (QID) | ORAL | Status: DC | PRN

## 2015-04-08 MED ORDER — PANTOPRAZOLE SODIUM 40 MG PO TBEC
40.0000 mg | DELAYED_RELEASE_TABLET | Freq: Two times a day (BID) | ORAL | Status: DC
  Administered 2015-04-08 – 2015-04-17 (×18): 40 mg via ORAL
  Filled 2015-04-08 (×18): qty 1

## 2015-04-08 MED ORDER — ONDANSETRON HCL 4 MG PO TABS: 4.0000 mg | ORAL_TABLET | Freq: Four times a day (QID) | ORAL | Status: DC | PRN

## 2015-04-08 MED ORDER — ESCITALOPRAM OXALATE 10 MG PO TABS
20.0000 mg | ORAL_TABLET | Freq: Every day | ORAL | Status: DC
  Administered 2015-04-08 – 2015-04-17 (×8): 20 mg via ORAL
  Filled 2015-04-08 (×9): qty 2

## 2015-04-08 MED ORDER — NICOTINE 21 MG/24HR TD PT24
21.0000 mg | MEDICATED_PATCH | Freq: Every day | TRANSDERMAL | Status: DC
  Administered 2015-04-08 – 2015-04-17 (×10): 21 mg via TRANSDERMAL
  Filled 2015-04-08 (×10): qty 1

## 2015-04-08 MED ORDER — FOLIC ACID 1 MG PO TABS
1.0000 mg | ORAL_TABLET | Freq: Every day | ORAL | Status: DC
  Administered 2015-04-08 – 2015-04-17 (×9): 1 mg via ORAL
  Filled 2015-04-08 (×10): qty 1

## 2015-04-08 MED ORDER — CLONAZEPAM 1 MG PO TABS: 1.0000 mg | ORAL_TABLET | Freq: Three times a day (TID) | ORAL | Status: DC | PRN

## 2015-04-08 MED ORDER — DOCUSATE SODIUM 100 MG PO CAPS
100.0000 mg | ORAL_CAPSULE | Freq: Two times a day (BID) | ORAL | Status: DC
  Administered 2015-04-08 – 2015-04-13 (×7): 100 mg via ORAL
  Filled 2015-04-08 (×11): qty 1

## 2015-04-08 MED ORDER — SODIUM CHLORIDE 0.9% FLUSH
3.0000 mL | Freq: Two times a day (BID) | INTRAVENOUS | Status: DC
  Administered 2015-04-10: 3 mL via INTRAVENOUS

## 2015-04-08 MED ORDER — PREGABALIN 50 MG PO CAPS
200.0000 mg | ORAL_CAPSULE | Freq: Two times a day (BID) | ORAL | Status: DC
  Administered 2015-04-08 – 2015-04-15 (×11): 200 mg via ORAL
  Filled 2015-04-08 (×16): qty 4

## 2015-04-08 MED ORDER — ONDANSETRON HCL 4 MG/2ML IJ SOLN: 4.0000 mg | Freq: Four times a day (QID) | INTRAMUSCULAR | Status: DC | PRN

## 2015-04-08 MED ORDER — ZOLPIDEM TARTRATE 5 MG PO TABS
10.0000 mg | ORAL_TABLET | Freq: Every evening | ORAL | Status: DC | PRN
  Administered 2015-04-08 – 2015-04-09 (×2): 10 mg via ORAL
  Filled 2015-04-08 (×2): qty 2

## 2015-04-08 MED ORDER — ARIPIPRAZOLE 2 MG PO TABS
2.0000 mg | ORAL_TABLET | Freq: Every day | ORAL | Status: DC
  Administered 2015-04-08 – 2015-04-17 (×8): 2 mg via ORAL
  Filled 2015-04-08 (×9): qty 1

## 2015-04-08 MED ORDER — POLYETHYLENE GLYCOL 3350 17 G PO PACK
17.0000 g | PACK | Freq: Every day | ORAL | Status: DC
  Administered 2015-04-09 – 2015-04-13 (×3): 17 g via ORAL
  Filled 2015-04-08 (×5): qty 1

## 2015-04-08 MED ORDER — DULOXETINE HCL 60 MG PO CPEP
60.0000 mg | ORAL_CAPSULE | ORAL | Status: DC
  Administered 2015-04-09 – 2015-04-16 (×7): 60 mg via ORAL
  Filled 2015-04-08 (×9): qty 1

## 2015-04-08 MED ORDER — VITAMIN D 1000 UNITS PO TABS
1000.0000 [IU] | ORAL_TABLET | Freq: Every day | ORAL | Status: DC
  Administered 2015-04-08 – 2015-04-17 (×9): 1000 [IU] via ORAL
  Filled 2015-04-08 (×10): qty 1

## 2015-04-08 NOTE — ED Provider Notes (Signed)
[facility] Emergency Department Provider Note  ____________________________________________  Time seen: Approximately 9:00 AM  I have reviewed the triage vital signs and the nursing notes.   HISTORY  Chief Complaint Shortness of Breath    HPI [name] is a 60 y.o. female history of COPD, bipolar disorder, fibromyalgia, history of anal cancer status post colostomy who presents for evaluation of several weeks worsening shortness of breath and right-sided chest pain in the setting of recurring right pleural effusion, gradual onset, worse over night, currently moderate to severe and worse with cough. Patient was seen by her pulmonologist 2 days ago and had a chest x-ray that showed recurrence of a large right-sided pleural effusion. She previously had 6 L of fluid drained from the right chest on 03/22/2015. During her doctor's appointment 2 days ago, it was recommended that she have the fluid drained immediately, she was told to come to the emergency department however she reports that she was not feeling particularly bad until yesterday evening. No vomiting, diarrhea, fevers or chills. No abdominal pain. Of note, her effusions previously were  exudative however the underlying cause is not yet known. According to pulmonologist note, the recommendation was for thoracoscopy with lung biopsy as well, Dr. Genevive Bi was consulted.   Past Medical History  Diagnosis Date  . Schizophrenia ([place])   . Asthma   . GERD (gastroesophageal reflux disease)   . Anxiety   . Depression   . Bipolar disorder ([place])   . COPD (chronic obstructive pulmonary disease) ([place])   . Occasional tremors     right hand  . PTSD (post-traumatic stress disorder)   . Shortness of breath dyspnea   . Fibromyalgia   . DVT (deep venous thrombosis) ([place]) 2011    RUE  . Thyroid nodule   . DDD (degenerative disc disease), lumbar   . Spinal stenosis   . Peripheral neuropathy ([place])   . Rotator cuff  tear     right  . Pneumonia 2011  . Hypothyroidism     no meds currently  . Anemia     during pregnancy only  . Hypertension     Off meds x 15 years-well controlled now per pt  . Squamous cell cancer, anus (HCC)   . Severe obstructive sleep apnea 06/27/2014  . DVT of upper extremity (deep vein thrombosis) ([place]) 10/14/2014    Patient Active Problem List   Diagnosis Date Noted  . Recurrent right pleural effusion 04/08/2015  . Hospital discharge follow-up 04/03/2015  . Recurrent pneumonia   . RUQ abdominal pain   . Sepsis ([place]) 03/15/2015  . CAP (community acquired pneumonia) 03/15/2015  . Pyrexia   . Coarse tremors 03/14/2015  . Insomnia 03/14/2015  . Poor mobility 03/14/2015  . HCAP (healthcare-associated pneumonia) 03/05/2015  . 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 GI bleed 03/02/2015  . SBO (small bowel obstruction) ([place])   . Anemia   . COPD exacerbation ([place]) 02/27/2015  . Aspiration pneumonitis ([place]) 02/27/2015  . Small bowel obstruction ([place]) 02/27/2015  . Hyperglycemia 02/27/2015  . Chronic pain syndrome 02/27/2015  . COPD (chronic obstructive pulmonary disease) ([place]) 02/26/2015  . COPD, very severe ([place]) 12/12/2014  . Fibromyalgia 12/12/2014  . DVT of upper extremity (deep vein thrombosis) ([place]) 10/14/2014  . B12 deficiency 09/28/2014  . Folate deficiency 09/28/2014  . Macrocytosis 09/21/2014  . Depression   . Hypokalemia 07/20/2014  . Anal cancer ([place]) 06/27/2014  . Anal fissure 06/27/2014  .  Anxiety 06/27/2014  . Airway hyperreactivity 06/27/2014  . H/O manic depressive disorder 06/27/2014  . CAFL (chronic airflow limitation) ([place]) 06/27/2014  . Clinical depression 06/27/2014  . Deep vein thrombosis ([place]) 06/27/2014  . External hemorrhoid 06/27/2014  . Myalgia and myositis 06/27/2014  . Acid reflux 06/27/2014  . Hemorrhoid 06/27/2014  . HTN (hypertension) 06/27/2014  . HLD (hyperlipidemia) 06/27/2014  . Adult  hypothyroidism 06/27/2014  . LBP (low back pain) 06/27/2014  . Mass of perianal area 06/27/2014  . External hemorrhoid, thrombosed 06/27/2014  . 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 Ulcerated hemorrhoid 06/27/2014  . Severe obstructive sleep apnea 06/27/2014  . Squamous cell cancer, anus (HCC)   . Rectal ulcer 05/17/2014    Past Surgical History  Procedure Laterality Date  . Foot surgery Right   . Tubal ligation    . Eye surgery Bilateral   . Mouth surgery  2002  . Rectal biopsy N/A 05/08/2014    Procedure: BIOPSY RECTAL;  Surgeon: Marlyce Huge, MD;  Location: [facility] ORS;  Service: General;  Laterality: N/A;  . Evaluation under anesthesia with hemorrhoidectomy N/A 05/08/2014    Procedure: EXAM UNDER ANESTHESIA WITH HEMORRHOIDECTOMY;  Surgeon: Marlyce Huge, MD;  Location: [facility] OR[facility];  Service: General;  Laterality: N/A;  . Portacath placement N/A 05/20/2014    Procedure: INSERTION PORT-A-CATH;  Surgeon: Florene Glen, MD;  Location: [facility];  Service: General;  Laterality: N/A;  . Laparoscopic diverted colostomy N/A 05/26/2014    Procedure: LAPAROSCOPIC DIVERTED COLOSTOMY;  Surgeon: Marlyce Huge, MD;  Location: [facility];  Service: General;  Laterality: N/A;  . Peripheral vascular catheterization Left 10/16/2014    Procedure: Upper Extremity Venography with thrombectomy, port removal;  Surgeon: Algernon Huxley, MD;  Location: [facility] [facility];  Service: Cardiovascular;  Laterality: Left;  . Peripheral vascular catheterization  10/16/2014    Procedure: Upper Extremity Intervention;  Surgeon: Algernon Huxley, MD;  Location: [place] [facility];  Service: Cardiovascular;;    No current outpatient prescriptions on file.  Allergies Fish allergy and Sulfa antibiotics  Family History  Problem Relation Age of Onset  . Cancer Maternal Aunt   . Cancer Paternal Uncle   . Diabetes Mother   . Thyroid disease Mother   . Kidney failure Mother   . Hypertension Mother   .  Depression Mother   . Mental illness Son   . Aneurysm Maternal Grandmother   . Thyroid disease Maternal Grandmother   . Stroke Maternal Grandfather   . Hypertension Maternal Grandfather   . Diabetes Maternal Grandfather   . Heart disease Maternal Grandfather     MI  . Nephrolithiasis Daughter     Social History Social History  Substance Use Topics  . Smoking status: Former Smoker -- 0.10 packs/day for 44 years    Types: Cigarettes    Start date: 10/04/1970  . Smokeless tobacco: Never Used  . Alcohol Use: No     Comment: 1 drink every 2-3 times/year    Review of Systems Constitutional: No fever/chills Eyes: No visual changes. ENT: No sore throat. Cardiovascular: +chest pain. Respiratory: +shortness of breath. Gastrointestinal: No abdominal pain.  No nausea, no vomiting.  No diarrhea.  No constipation. Genitourinary: Negative for dysuria. Musculoskeletal: Negative for back pain. Skin: Negative for rash. Neurological: Negative for headaches, focal weakness or numbness.  10-point ROS otherwise negative.  ____________________________________________   PHYSICAL EXAM:  Filed Vitals:   04/08/15 1330 04/08/15 1400 04/08/15 1443 04/08/15 1600  BP: 111/76 139/76 113/53  Pulse:  76 84   Temp:   97.7 F (36.5 C)   TempSrc:   Oral   Resp: '17 20 20   '$ Height:      Weight:      SpO2:  94% 90% 90%    VITAL SIGNS: ED Triage Vitals  Enc Vitals Group     BP --      Pulse --      Resp --      Temp --      Temp src --      SpO2 04/08/15 0856 94 %     Weight --      Height --      Head Cir --      Peak Flow --      Pain Score --      Pain Loc --      Pain Edu? --      Excl. in [place]? --     Constitutional: Alert and oriented. Chronically ill-appearing and in no acute distress. Eyes: Conjunctivae are normal. PERRL. EOMI. Head: Atraumatic. Nose: No congestion/rhinnorhea. Mouth/Throat: Mucous membranes are moist.  Oropharynx non-erythematous. Neck: No stridor. Supple  without meningismus. Cardiovascular: Normal rate, regular rhythm. Grossly normal heart sounds.  Good peripheral circulation. Respiratory: Normal respiratory effort.  No retractions. Coarse breath sounds in the right lung fields. Faint and auditory wheezing the left lung fields. Gastrointestinal: Soft and nontender. + colostomy in the left abdomen draining brown liquid stool. No CVA tenderness. Genitourinary: deferred Musculoskeletal: No lower extremity tenderness nor edema.  No joint effusions. Neurologic:  Normal speech and language. No gross focal neurologic deficits are appreciated. No gait instability. Skin:  Skin is warm, dry and intact. No rash noted. Psychiatric: Mood and affect are normal. Speech and behavior are normal.  ____________________________________________   LABS (all labs ordered are listed, but only abnormal results are displayed)  Labs Reviewed  CBC WITH DIFFERENTIAL/PLATELET - Abnormal; Notable for the following:    RBC 3.08 (*)    Hemoglobin 9.0 (*)    HCT 27.1 (*)    RDW 16.8 (*)    Lymphs Abs 0.6 (*)    All other components within normal limits  COMPREHENSIVE METABOLIC PANEL - Abnormal; Notable for the following:    Sodium 133 (*)    Potassium 3.0 (*)    Chloride 100 (*)    Glucose, Bld 116 (*)    Calcium 7.7 (*)    Total Protein 5.7 (*)    Albumin 2.1 (*)    AST 43 (*)    Alkaline Phosphatase 135 (*)    All other components within normal limits  PROTIME-INR  APTT  TROPONIN I   ____________________________________________  EKG  ED ECG REPORT I, Joanne Gavel, the attending physician, personally viewed and interpreted this ECG.   Date: 04/08/2015  EKG Time: 09:04  Rate: 88  Rhythm: normal sinus rhythm  Axis: normal  Intervals:none  ST&T Change: No acute ST elevation. No acute ST depression.  ____________________________________________  RADIOLOGY  CXR FINDINGS: Stable large right pleural effusion with associated right lower  lobe atelectasis. No interval change. Left lung remains clear. No pneumothorax. Stable heart size and vascularity. Chronic compression fractures of the lower thoracic spine.  IMPRESSION: Stable large right pleural effusion and associated compressive atelectasis. No interval change compared to 04/06/2015 ____________________________________________   PROCEDURES  Procedure(s) performed: None  Critical Care performed: No  ____________________________________________   INITIAL IMPRESSION / ASSESSMENT AND PLAN / ED COURSE  Pertinent  labs & imaging results that were available during my care of the patient were reviewed by me and considered in my medical decision making (see chart for details).  [name] is a 60 y.o. female history of COPD, bipolar disorder, fibromyalgia, history of anal cancer status post colostomy who presents for evaluation of several weeks worsening shortness of breath and right-sided chest pain in the setting of recurring right pleural effusion. She was seen by her troponin WAS 2 days ago and chest x-ray showed a large right pleural effusion and the recommendation was for drainage. On exam, she is nontoxic appearing though chronically ill-appearing. She is in no acute distress. She has diminished breath sounds all throughout the right lung fields. Faint expiratory wheeze on the left. We'll obtain screening labs, repeat chest x-ray and EKG.  ----------------------------------------- 11:08 AM on 04/08/2015 -----------------------------------------  Chest x-ray with large right-sided pleural effusion. The case with Dr. Alva Garnet the pulmonology as well as Dr. Genevive Bi of thoracic surgery who agreed that slightly in the best interest for her to be admitted to the hospital for VATS procedure with biopsy. Case discussed with hospitalist, Dr. Doy Hutching, for admission at this time. ____________________________________________   FINAL CLINICAL IMPRESSION(S) / ED  DIAGNOSES  Final diagnoses:  Pleural effusion  SOB (shortness of breath)      Joanne Gavel, MD 04/08/15 [phone]

## 2015-04-08 NOTE — ED Notes (Signed)
Patient transported to X-ray 

## 2015-04-08 NOTE — ED Notes (Signed)
Pt arrived by EMS from home. Pt was at her PCP on Friday and told she had "fluid on her rt lung and told to seek emergency medical attention".

## 2015-04-08 NOTE — H&P (Signed)
History and Physical    [name] QVZ:[id number] DOB: April 20, 1955 DOA: 04/08/2015  Referring physician: Dr. Edd Fabian PCP: [name], NP  Specialists: Dr. Mortimer Fries  Chief Complaint: worsening SOB  HPI: [name] is a 60 y.o. female has a past medical history significant for COPD and bipolar D/O recently admitted for COPD exacerbation now with progressive SOB and enlarging right pleural effusion. Has had the effusion tapped in the past. Has hx of UE DVT but no hx of PE. No obvious dx of malignancy or infection. She is now admitted for further evaluation. Does c/o worsening SOB with pleurisy on the right. No fever. Does have cough productive of clear sputum  Review of Systems: The patient denies anorexia, fever, weight loss,, vision loss, decreased hearing, hoarseness,  syncope, peripheral edema, balance deficits, hemoptysis, abdominal pain, melena, hematochezia, severe indigestion/heartburn, hematuria, incontinence, genital sores, muscle weakness, suspicious skin lesions, transient blindness, difficulty walking, depression, unusual weight change, abnormal bleeding, enlarged lymph nodes, angioedema, and breast masses.   Past Medical History  Diagnosis Date  . Schizophrenia ([place])   . Asthma   . GERD (gastroesophageal reflux disease)   . Anxiety   . Depression   . Bipolar disorder ([place])   . COPD (chronic obstructive pulmonary disease) ([place])   . Occasional tremors     right hand  . PTSD (post-traumatic stress disorder)   . Shortness of breath dyspnea   . Fibromyalgia   . DVT (deep venous thrombosis) ([place]) 2011    RUE  . Thyroid nodule   . DDD (degenerative disc disease), lumbar   . Spinal stenosis   . Peripheral neuropathy ([place])   . Rotator cuff tear     right  . Pneumonia 2011  . Hypothyroidism     no meds currently  . Anemia     during pregnancy only  . Hypertension     Off meds x 15 years-well controlled now per pt  . Squamous cell cancer, anus (HCC)   . Severe  obstructive sleep apnea 06/27/2014  . DVT of upper extremity (deep vein thrombosis) ([place]) 10/14/2014   Past Surgical History  Procedure Laterality Date  . Foot surgery Right   . Tubal ligation    . Eye surgery Bilateral   . Mouth surgery  2002  . Rectal biopsy N/A 05/08/2014    Procedure: BIOPSY RECTAL;  Surgeon: Marlyce Huge, MD;  Location: [facility];  Service: General;  Laterality: N/A;  . Evaluation under anesthesia with hemorrhoidectomy N/A 05/08/2014    Procedure: EXAM UNDER ANESTHESIA WITH HEMORRHOIDECTOMY;  Surgeon: Marlyce Huge, MD;  Location: [facility];  Service: General;  Laterality: N/A;  . Portacath placement N/A 05/20/2014    Procedure: INSERTION PORT-A-CATH;  Surgeon: Florene Glen, MD;  Location: [facility];  Service: General;  Laterality: N/A;  . Laparoscopic diverted colostomy N/A 05/26/2014    Procedure: LAPAROSCOPIC DIVERTED COLOSTOMY;  Surgeon: Marlyce Huge, MD;  Location: [facility];  Service: General;  Laterality: N/A;  . Peripheral vascular catheterization Left 10/16/2014    Procedure: Upper Extremity Venography with thrombectomy, port removal;  Surgeon: Algernon Huxley, MD;  Location: [facility];  Service: Cardiovascular;  Laterality: Left;  . Peripheral vascular catheterization  10/16/2014    Procedure: Upper Extremity Intervention;  Surgeon: Algernon Huxley, MD;  Location: [facility];  Service: Cardiovascular;;   Social History:  reports that she has quit smoking. Her smoking use included Cigarettes. She started smoking about 44 years ago. She  has a 4.4 pack-year smoking history. She has never used smokeless tobacco. She reports that she does not drink alcohol or use illicit drugs.  Allergies  Allergen Reactions  . Fish Allergy Anaphylaxis    Patient allergic to perch only. She can eat other fish.  . Sulfa Antibiotics Hives    Family History  Problem Relation Age of Onset  . Cancer Maternal Aunt   . Cancer Paternal Uncle   .  Diabetes Mother   . Thyroid disease Mother   . Kidney failure Mother   . Hypertension Mother   . Depression Mother   . Mental illness Son   . Aneurysm Maternal Grandmother   . Thyroid disease Maternal Grandmother   . Stroke Maternal Grandfather   . Hypertension Maternal Grandfather   . Diabetes Maternal Grandfather   . Heart disease Maternal Grandfather     MI  . Nephrolithiasis Daughter     Prior to Admission medications   Medication Sig Start Date End Date Taking? Authorizing Provider  albuterol (PROVENTIL HFA;VENTOLIN HFA) 108 (90 BASE) MCG/ACT inhaler Inhale 2 puffs into the lungs every 6 (six) hours as needed for wheezing or shortness of breath.    Yes Historical Provider, MD  albuterol (PROVENTIL) (2.5 MG/3ML) 0.083% nebulizer solution Take 3 mLs (2.5 mg total) by nebulization every 4 (four) hours. Patient taking differently: Take 2.5 mg by nebulization every 4 (four) hours as needed for wheezing or shortness of breath.  02/27/15  Yes Theodoro Grist, MD  ARIPiprazole (ABILIFY) 2 MG tablet Take 1 tablet (2 mg total) by mouth daily. 01/31/15  Yes Marjie Skiff, MD  baclofen (LIORESAL) 10 MG tablet Take 1 tablet (10 mg total) by mouth 3 (three) times daily. Patient taking differently: Take 10 mg by mouth 3 (three) times daily as needed for muscle spasms.  02/09/15  Yes [name], NP  budesonide-formoterol (SYMBICORT) 160-4.5 MCG/ACT inhaler Inhale 2 puffs into the lungs 2 (two) times daily. 01/30/15  Yes [name], NP  cholecalciferol (VITAMIN D) 1000 units tablet Take 1,000 Units by mouth daily.    Yes Historical Provider, MD  clonazePAM (KLONOPIN) 1 MG tablet Take 1 tablet (1 mg total) by mouth 3 (three) times daily as needed for anxiety. 01/31/15  Yes Marjie Skiff, MD  DULoxetine (CYMBALTA) 60 MG capsule Take 60 mg by mouth every morning.    Yes Historical Provider, MD  escitalopram (LEXAPRO) 20 MG tablet Take 1 tablet (20 mg total) by mouth daily. 01/31/15  Yes Marjie Skiff, MD  folic acid (FOLVITE) 1 MG tablet Take 1 tablet (1 mg total) by mouth daily. 03/12/15  Yes Lequita Asal, MD  nicotine (NICODERM CQ - DOSED IN MG/24 HOURS) 21 mg/24hr patch Place 1 patch (21 mg total) onto the skin daily. 03/02/15  Yes Theodoro Grist, MD  nystatin (MYCOSTATIN) 100000 UNIT/ML suspension Take 5 mLs (500,000 Units total) by mouth 4 (four) times daily. 03/09/15  Yes [name], NP  oxyCODONE-acetaminophen (PERCOCET) 10-325 MG tablet Take 1 tablet by mouth every 8 (eight) hours as needed for pain. 04/03/15  Yes [name], NP  pantoprazole (PROTONIX) 40 MG tablet Take 40 mg by mouth 2 (two) times daily.    Yes Historical Provider, MD  polyethylene glycol (MIRALAX / GLYCOLAX) packet Take 17 g by mouth daily. 03/02/15  Yes Theodoro Grist, MD  senna-docusate (SENOKOT-S) 8.6-50 MG tablet Take 1 tablet by mouth at bedtime as needed for mild constipation. 02/27/15  Yes Theodoro Grist, MD  tiotropium (SPIRIVA) 18 MCG inhalation capsule Place 1 capsule (18 mcg total) into inhaler and inhale daily. 02/26/15  Yes [name], NP  traZODone (DESYREL) 50 MG tablet Take 1 tablet (50 mg total) by mouth at bedtime as needed for sleep. 04/03/15  Yes [name], NP  zolpidem (AMBIEN) 10 MG tablet Take 1 tablet (10 mg total) by mouth at bedtime as needed for sleep. 01/31/15  Yes Marjie Skiff, MD  pregabalin (LYRICA) 200 MG capsule Take 1 capsule (200 mg total) by mouth 2 (two) times daily as needed. 03/18/15   Gladstone Lighter, MD   Physical Exam: Filed Vitals:   04/08/15 0856 04/08/15 0908  BP:  120/78  Pulse:  89  Temp:  98.6 F (37 C)  TempSrc:  Oral  Height:  '5\' 5"'$  (1.651 m)  Weight:  86.637 kg (191 lb)  SpO2: 94% 93%     General:  No apparent distress, WDWN, [place]/AT  Eyes: PERRL, EOMI, no scleral icterus, conjunctiva clear  ENT: moist oropharynx without lesions or exudate, TM's benign, dentition fair  Neck: supple, no lymphadenopathy. No brutis or  thyromegaly  Cardiovascular: regular rate without MRG; 2+ peripheral pulses, no JVD, no peripheral edema  Respiratory: scattered rhonchi without wheezes or rales. Decreased breath sounds with dullness at the right base  Abdomen: soft, non tender to palpation, positive bowel sounds, no guarding, no rebound, no organomegaly  Skin: no rashes or lesions, cool/moist  Musculoskeletal: normal bulk and tone, no joint swelling  Psychiatric: normal mood and affect, A&OX3  Neurologic: CN 2-12 grossly intact, Motor strength 5/5 in all 4 groups with symmetric DTR's and normal sensory exam  Labs on Admission:  Basic Metabolic Panel:  Recent Labs Lab 04/08/15 0929  NA 133*  K 3.0*  CL 100*  CO2 27  GLUCOSE 116*  BUN 8  CREATININE 0.76  CALCIUM 7.7*   Liver Function Tests:  Recent Labs Lab 04/08/15 0929  AST 43*  ALT 15  ALKPHOS 135*  BILITOT 0.9  PROT 5.7*  ALBUMIN 2.1*   No results for input(s): LIPASE, AMYLASE in the last 168 hours. No results for input(s): AMMONIA in the last 168 hours. CBC:  Recent Labs Lab 04/08/15 0929  WBC 7.3  NEUTROABS 5.3  HGB 9.0*  HCT 27.1*  MCV 88.1  PLT 246   Cardiac Enzymes:  Recent Labs Lab 04/08/15 0929  TROPONINI <0.03    BNP (last 3 results)  Recent Labs  03/22/15 1034  BNP 80.0    ProBNP (last 3 results) No results for input(s): PROBNP in the last 8760 hours.  CBG: No results for input(s): GLUCAP in the last 168 hours.  Radiological Exams on Admission: Dg Chest 2 View  04/08/2015  CLINICAL DATA:  Increasing shortness of breath, recurrent right pleural effusion EXAM: CHEST  2 VIEW COMPARISON:  04/06/2015, 03/15/2015 FINDINGS: Stable large right pleural effusion with associated right lower lobe atelectasis. No interval change. Left lung remains clear. No pneumothorax. Stable heart size and vascularity. Chronic compression fractures of the lower thoracic spine. IMPRESSION: Stable large right pleural effusion and  associated compressive atelectasis. No interval change compared to 04/06/2015 Electronically Signed   By: Jerilynn Mages.  Shick M.D.   On: 04/08/2015 09:30    EKG: Independently reviewed.  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 Consult Thoracic Surgery for effusion drainage and cytology. Monitor labs closely. IV Dilaudid for pain control  Diet: low salt Fluids: NS with K+'@75'$  DVT Prophylaxis: SQ Heparin  Code Status: FULL  Family Communication: none  Disposition Plan: home  Time spent: 50 min

## 2015-04-09 ENCOUNTER — Encounter: Payer: Self-pay | Admitting: *Deleted

## 2015-04-09 ENCOUNTER — Other Ambulatory Visit: Payer: Self-pay | Admitting: *Deleted

## 2015-04-09 DIAGNOSIS — J441 Chronic obstructive pulmonary disease with (acute) exacerbation: Secondary | ICD-10-CM

## 2015-04-09 LAB — GLUCOSE, CAPILLARY: GLUCOSE-CAPILLARY: 104 mg/dL — AB (ref 65–99)

## 2015-04-09 LAB — CBC
HCT: 24.3 % — ABNORMAL LOW (ref 35.0–47.0)
HEMOGLOBIN: 8 g/dL — AB (ref 12.0–16.0)
MCH: 29 pg (ref 26.0–34.0)
MCHC: 32.8 g/dL (ref 32.0–36.0)
MCV: 88.4 fL (ref 80.0–100.0)
Platelets: 182 10*3/uL (ref 150–440)
RBC: 2.75 MIL/uL — ABNORMAL LOW (ref 3.80–5.20)
RDW: 16.5 % — ABNORMAL HIGH (ref 11.5–14.5)
WBC: 6.7 10*3/uL (ref 3.6–11.0)

## 2015-04-09 LAB — COMPREHENSIVE METABOLIC PANEL
ALBUMIN: 1.9 g/dL — AB (ref 3.5–5.0)
ALK PHOS: 115 U/L (ref 38–126)
ALT: 12 U/L — ABNORMAL LOW (ref 14–54)
ANION GAP: 2 — AB (ref 5–15)
AST: 30 U/L (ref 15–41)
BUN: 9 mg/dL (ref 6–20)
CALCIUM: 7.9 mg/dL — AB (ref 8.9–10.3)
CO2: 30 mmol/L (ref 22–32)
Chloride: 104 mmol/L (ref 101–111)
Creatinine, Ser: 0.85 mg/dL (ref 0.44–1.00)
GLUCOSE: 136 mg/dL — AB (ref 65–99)
Potassium: 3.1 mmol/L — ABNORMAL LOW (ref 3.5–5.1)
Sodium: 136 mmol/L (ref 135–145)
TOTAL PROTEIN: 5.3 g/dL — AB (ref 6.5–8.1)

## 2015-04-09 LAB — MAGNESIUM: Magnesium: 1.9 mg/dL (ref 1.7–2.4)

## 2015-04-09 MED ORDER — POTASSIUM CHLORIDE CRYS ER 20 MEQ PO TBCR
20.0000 meq | EXTENDED_RELEASE_TABLET | Freq: Once | ORAL | Status: AC
  Administered 2015-04-09: 20 meq via ORAL
  Filled 2015-04-09: qty 1

## 2015-04-09 NOTE — Progress Notes (Signed)
Patient ID: [name], female   DOB: [birth year], 60 y.o.   MRN: [id number]  Chief Complaint  Patient presents with  . Shortness of Breath    Referred By Dr. Louie Bun Reason for Referral right pleural effusion  HPI Location, Quality, Duration, Severity, Timing, Context, Modifying Factors, Associated Signs and Symptoms.  [name] is a 60 y.o. female.  She has been admitted to the hospital 3 times over the last several weeks with complaints of right-sided chest pain and shortness of breath. She's been diagnosed with a recurrent pleural effusion and recently saw Dr. Louie Bun for evaluation. A CT scan performed within the last several weeks that shown multiple nodules along the right pleural surface and a moderate size right pleural effusion. The patient did have a thoracentesis performed at one point and did feel somewhat improved after that. However over the last week or 2 she's experienced increasing right-sided chest wall pain particularly with cough and sneezing. In addition she's noticed increasing shortness of breath. All these symptoms prompted her admission to the hospital through our emergency room. She denied any fevers. She denied any chills. She denied any sputum production. She has seen Dr. Mike Gip and Dr. Donella Stade in the past for her anal carcinoma. She's also been seen multiple times by [facility] for her diverging colostomy and also for potential reversal of her colostomy. Most recently she has seen Dr. Phoebe Perch who has evaluated her for possible colostomy takedown. However the results of the upcoming fluid analysis on her pleural space will certainly dictate this.   Past Medical History  Diagnosis Date  . Schizophrenia ([place])   . Asthma   . GERD (gastroesophageal reflux disease)   . Anxiety   . Depression   . Bipolar disorder ([place])   . COPD (chronic obstructive pulmonary disease) ([place])   . Occasional tremors     right hand  . PTSD  (post-traumatic stress disorder)   . Shortness of breath dyspnea   . Fibromyalgia   . DVT (deep venous thrombosis) ([place]) 2011    RUE  . Thyroid nodule   . DDD (degenerative disc disease), lumbar   . Spinal stenosis   . Peripheral neuropathy ([place])   . Rotator cuff tear     right  . Pneumonia 2011  . Hypothyroidism     no meds currently  . Anemia     during pregnancy only  . Hypertension     Off meds x 15 years-well controlled now per pt  . Squamous cell cancer, anus (HCC)   . Severe obstructive sleep apnea 06/27/2014  . DVT of upper extremity (deep vein thrombosis) ([place]) 10/14/2014    Past Surgical History  Procedure Laterality Date  . Foot surgery Right   . Tubal ligation    . Eye surgery Bilateral   . Mouth surgery  2002  . Rectal biopsy N/A 05/08/2014    Procedure: BIOPSY RECTAL;  Surgeon: Marlyce Huge, MD;  Location: [facility] ORS;  Service: General;  Laterality: N/A;  . Evaluation under anesthesia with hemorrhoidectomy N/A 05/08/2014    Procedure: EXAM UNDER ANESTHESIA WITH HEMORRHOIDECTOMY;  Surgeon: Marlyce Huge, MD;  Location: [facility] ORS;  Service: General;  Laterality: N/A;  . Portacath placement N/A 05/20/2014    Procedure: INSERTION PORT-A-CATH;  Surgeon: Florene Glen, MD;  Location: [facility] ORS;  Service: General;  Laterality: N/A;  . Laparoscopic diverted colostomy N/A 05/26/2014    Procedure: LAPAROSCOPIC DIVERTED COLOSTOMY;  Surgeon: Marlyce Huge, MD;  Location: [facility] ORS;  Service: General;  Laterality: N/A;  . Peripheral vascular catheterization Left 10/16/2014    Procedure: Upper Extremity Venography with thrombectomy, port removal;  Surgeon: Algernon Huxley, MD;  Location: [place] [facility];  Service: Cardiovascular;  Laterality: Left;  . Peripheral vascular catheterization  10/16/2014    Procedure: Upper Extremity Intervention;  Surgeon: Algernon Huxley, MD;  Location: [place] [facility];  Service: Cardiovascular;;    Family History  Problem  Relation Age of Onset  . Cancer Maternal Aunt   . Cancer Paternal Uncle   . Diabetes Mother   . Thyroid disease Mother   . Kidney failure Mother   . Hypertension Mother   . Depression Mother   . Mental illness Son   . Aneurysm Maternal Grandmother   . Thyroid disease Maternal Grandmother   . Stroke Maternal Grandfather   . Hypertension Maternal Grandfather   . Diabetes Maternal Grandfather   . Heart disease Maternal Grandfather     MI  . Nephrolithiasis Daughter     Social History Social History  Substance Use Topics  . Smoking status: Former Smoker -- 0.10 packs/day for 44 years    Types: Cigarettes    Start date: 10/04/1970  . Smokeless tobacco: Never Used  . Alcohol Use: No     Comment: 1 drink every 2-3 times/year    Allergies  Allergen Reactions  . Fish Allergy Anaphylaxis    Patient allergic to perch only. She can eat other fish.  . Sulfa Antibiotics Hives    Current Facility-Administered Medications  Medication Dose Route Frequency Provider Last Rate Last Dose  . 0.9 % NaCl with KCl 40 mEq / L  infusion   Intravenous Continuous Idelle Crouch, MD 75 mL/hr at 04/08/15 2206 75 mL/hr at 04/08/15 2206  . acetaminophen (TYLENOL) tablet 650 mg  650 mg Oral Q6H PRN Idelle Crouch, MD       Or  . acetaminophen (TYLENOL) suppository 650 mg  650 mg Rectal Q6H PRN Idelle Crouch, MD      . ARIPiprazole (ABILIFY) tablet 2 mg  2 mg Oral Daily Idelle Crouch, MD   2 mg at 04/09/15 2423  . aspirin EC tablet 81 mg  81 mg Oral Daily Idelle Crouch, MD   81 mg at 04/09/15 5361  . baclofen (LIORESAL) tablet 10 mg  10 mg Oral TID PRN Idelle Crouch, MD      . bisacodyl (DULCOLAX) suppository 10 mg  10 mg Rectal Daily PRN Idelle Crouch, MD      . cholecalciferol (VITAMIN D) tablet 1,000 Units  1,000 Units Oral Daily Idelle Crouch, MD   1,000 Units at 04/09/15 [phone]  . clonazePAM (KLONOPIN) tablet 1 mg  1 mg Oral TID PRN Idelle Crouch, MD      . docusate sodium  (COLACE) capsule 100 mg  100 mg Oral BID Idelle Crouch, MD   100 mg at 04/09/15 5400  . DULoxetine (CYMBALTA) DR capsule 60 mg  60 mg Oral BH-q7a Idelle Crouch, MD   60 mg at 04/09/15 8676  . escitalopram (LEXAPRO) tablet 20 mg  20 mg Oral Daily Idelle Crouch, MD   20 mg at 04/09/15 [phone]  . folic acid (FOLVITE) tablet 1 mg  1 mg Oral Daily Idelle Crouch, MD   1 mg at 04/09/15 9326  . heparin injection 5,000 Units  5,000 Units Subcutaneous 3 times per day Leonie Douglas  Sparks, MD   5,000 Units at 04/09/15 [phone]  . HYDROmorphone (DILAUDID) injection 1 mg  1 mg Intravenous Q4H PRN Idelle Crouch, MD      . ipratropium-albuterol (DUONEB) 0.5-2.5 (3) MG/3ML nebulizer solution 3 mL  3 mL Nebulization QID Idelle Crouch, MD   3 mL at 04/09/15 0943  . mometasone-formoterol (DULERA) 200-5 MCG/ACT inhaler 2 puff  2 puff Inhalation BID Idelle Crouch, MD   2 puff at 04/08/15 2007  . nicotine (NICODERM CQ - dosed in mg/24 hours) patch 21 mg  21 mg Transdermal Daily Idelle Crouch, MD   21 mg at 04/09/15 0835  . ondansetron (ZOFRAN) tablet 4 mg  4 mg Oral Q6H PRN Idelle Crouch, MD       Or  . ondansetron [facility]) injection 4 mg  4 mg Intravenous Q6H PRN Idelle Crouch, MD      . oxyCODONE-acetaminophen (PERCOCET/ROXICET) 5-325 MG per tablet 1 tablet  1 tablet Oral Q8H PRN Idelle Crouch, MD   1 tablet at 04/09/15 0700   And  . oxyCODONE (Oxy IR/ROXICODONE) immediate release tablet 5 mg  5 mg Oral Q8H PRN Idelle Crouch, MD   5 mg at 04/09/15 0700  . pantoprazole (PROTONIX) EC tablet 40 mg  40 mg Oral BID Idelle Crouch, MD   40 mg at 04/09/15 3295  . piperacillin-tazobactam (ZOSYN) IVPB 3.375 g  3.375 g Intravenous 3 times per day Idelle Crouch, MD   3.375 g at 04/09/15 0641  . polyethylene glycol (MIRALAX / GLYCOLAX) packet 17 g  17 g Oral Daily Idelle Crouch, MD   17 g at 04/09/15 0830  . pregabalin (LYRICA) capsule 200 mg  200 mg Oral BID Idelle Crouch, MD   200 mg at  04/09/15 1884  . sodium chloride flush (NS) 0.9 % injection 3 mL  3 mL Intravenous Q12H Idelle Crouch, MD   3 mL at 04/08/15 1415  . tiotropium (SPIRIVA) inhalation capsule 18 mcg  18 mcg Inhalation Daily Idelle Crouch, MD   18 mcg at 04/08/15 1600  . traZODone (DESYREL) tablet 50 mg  50 mg Oral QHS PRN Idelle Crouch, MD      . zolpidem [facility]) tablet 10 mg  10 mg Oral QHS PRN Idelle Crouch, MD   10 mg at 04/08/15 2205   Facility-Administered Medications Ordered in Other Encounters  Medication Dose Route Frequency Provider Last Rate Last Dose  . heparin lock flush 100 unit/mL  500 Units Intravenous Once Lequita Asal, MD      . sodium chloride 0.9 % injection 10 mL  10 mL Intravenous PRN Lequita Asal, MD          Review of Systems A complete review of systems was asked and was negative except for the following positive findingsIncreasing shortness of breath, right-sided chest pain, occasional blood in her stoma bag.  Blood pressure 112/58, pulse 73, temperature 97.8 F (36.6 C), temperature source Oral, resp. rate 18, height '5\' 5"'$  (1.651 m), weight 199 lb (90.266 kg), SpO2 96 %.  Physical Exam CONSTITUTIONAL:  Pleasant, well-developed, well-nourished, and in no acute distress. EYES: Pupils equal and reactive to light, Sclera non-icteric EARS, NOSE, MOUTH AND THROAT:  The oropharynx was clear.  Dentition is good repair.  Oral mucosa pink and moist. LYMPH NODES:  Lymph nodes in the neck and axillae were normal RESPIRATORY:  Lungs demonstrated rales at the left base and  markedly diminished breath sounds on the right..  Normal respiratory effort without pathologic use of accessory muscles of respiration CARDIOVASCULAR: Heart was regular without murmurs.  There were no carotid bruits. GI: The abdomen was soft, nontender, and nondistended. There were no palpable masses. There was no hepatosplenomegaly. There were normal bowel sounds in all quadrants. GU:  Rectal  deferred.  There is a functioning stoma in the left lower quadrant with brown stool within the bag. MUSCULOSKELETAL:  Normal muscle strength and tone.  No clubbing or cyanosis.   SKIN:  There were no pathologic skin lesions.  There were no nodules on palpation. NEUROLOGIC:  Sensation is normal.  Cranial nerves are grossly intact. PSYCH:  Oriented to person, place and time.  Mood and affect are normal.  Data Reviewed Chest x-rays and CT scans  I have personally reviewed the patient's imaging, laboratory findings and medical records.    Assessment    I have independently reviewed the patient's CT scan. 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 I base this upon the recurrent nature of the fluid and the nodularity seen on the CT scan.    Plan    I discussed with her the options including thoracentesis, Pleurx catheter insertion, and finally we discussed talc pleurodesis with pleural biopsy. I explained her the indications and risks of these procedures. I told her that'll be my intention to perform a thoracoscopic talc pleurodesis with pleural biopsy. She understood and would like to proceed. I did explain to her that there is the possibility that we may put a Pleurx catheter in as well. She understood the ramifications of that. Risks of bleeding, infection and death were all reviewed. She would like to proceed as soon as possible. I will alert Dr. Mike Gip to her presence in the hospital so that she can help [name] coordinate care.       Nestor Lewandowsky, MD 04/09/2015, 11:09 AM

## 2015-04-09 NOTE — Progress Notes (Signed)
Initial Nutrition Assessment  DOCUMENTATION CODES:   Obesity unspecified  INTERVENTION:   -Cater to pt preferences -Will send Mighty Shakes as afternoon snack (each supplement provides approximately 300kcals and 9g protein) -Agree with daily weights, will monitor trend   NUTRITION DIAGNOSIS:   Inadequate oral intake related to acute illness as evidenced by per patient/family report.  GOAL:   Patient will meet greater than or equal to 90% of their needs  MONITOR:   PO intake, Supplement acceptance, Labs, Weight trends, I & O's  REASON FOR ASSESSMENT:   Malnutrition Screening Tool    ASSESSMENT:   Pt admitted with SOB with recurrent pleural effusion per MD note, Dr. Genevive Bi following. Per MD note, pt s/p removal of 6L on 3/16. Pt with h/o anal cancer with colostomy present.  Past Medical History  Diagnosis Date  . Schizophrenia ([place])   . Asthma   . GERD (gastroesophageal reflux disease)   . Anxiety   . Depression   . Bipolar disorder ([place])   . COPD (chronic obstructive pulmonary disease) ([place])   . Occasional tremors     right hand  . PTSD (post-traumatic stress disorder)   . Shortness of breath dyspnea   . Fibromyalgia   . DVT (deep venous thrombosis) ([place]) 2011    RUE  . Thyroid nodule   . DDD (degenerative disc disease), lumbar   . Spinal stenosis   . Peripheral neuropathy ([place])   . Rotator cuff tear     right  . Pneumonia 2011  . Hypothyroidism     no meds currently  . Anemia     during pregnancy only  . Hypertension     Off meds x 15 years-well controlled now per pt  . Squamous cell cancer, anus (HCC)   . Severe obstructive sleep apnea 06/27/2014  . DVT of upper extremity (deep vein thrombosis) ([place]) 10/14/2014     Diet Order:  Diet 2 gram sodium Room service appropriate?: Yes; Fluid consistency:: Thin   Current Nutrition: Pt reports eating very well this morning, 100% of meal ordered. Pt was eating chocolate ice cream on visit, had just finished.    Food/Nutrition-Related History: Pt reports for the past week having very little appetite with the exception of the day before admission. Pt reports the day before admission eating a hamburger for lunch and 2 cheese dogs for dinner. Pt reports for the past week she would skip breakfast and then eat a sandwich/burger or soup for lunch and then usually was not eating at dinner. Pt reports not drinking any kind of supplement usually.    Scheduled Medications:  . ARIPiprazole  2 mg Oral Daily  . aspirin EC  81 mg Oral Daily  . cholecalciferol  1,000 Units Oral Daily  . docusate sodium  100 mg Oral BID  . DULoxetine  60 mg Oral BH-q7a  . escitalopram  20 mg Oral Daily  . folic acid  1 mg Oral Daily  . ipratropium-albuterol  3 mL Nebulization QID  . mometasone-formoterol  2 puff Inhalation BID  . nicotine  21 mg Transdermal Daily  . pantoprazole  40 mg Oral BID  . piperacillin-tazobactam (ZOSYN)  IV  3.375 g Intravenous 3 times per day  . polyethylene glycol  17 g Oral Daily  . pregabalin  200 mg Oral BID  . sodium chloride flush  3 mL Intravenous Q12H  . tiotropium  18 mcg Inhalation Daily    Continuous Medications:  . 0.9 % NaCl with KCl 40  mEq / L 75 mL/hr (04/08/15 2206)    Labs: reviewed, K 3.1 (being supplemented in IVF)  Gastrointestinal Profile: Last BM:  04/09/2015 via ostomy   Nutrition-Focused Physical Exam Findings: Nutrition-Focused physical exam completed. Findings are WDL for fat depletion, muscle depletion, and edema.    Weight Change: Pt reports weight of 213lbs but could not tell writer when she last weighed 213lbs. Pt reports outpatient MD office this past Thursday having weighed 190lbs. Current weight of 199lbs per CH[facility]. RD notes that pt had 6L of fluid removed on 3/16.    Skin:  Reviewed, no issues   Height:   Ht Readings from Last 1 Encounters:  04/08/15 '5\' 5"'$  (1.651 m)    Weight:   Wt Readings from Last 1 Encounters:  04/09/15 199 lb (90.266 kg)     Ideal Body Weight:  56.8 kg  BMI:  Body mass index is 33.12 kg/(m^2).  Estimated Nutritional Needs:   Kcal:  1500-1785kcals  Protein:  45-57g protein  Fluid:  1.4-1.7L  EDUCATION NEEDS:   No education needs identified at this time  Dwyane Luo, RD, LDN Pager [phone] Weekend/On-Call Pager [phone]

## 2015-04-09 NOTE — Progress Notes (Signed)
[place] [facility] [place] NAME: [name]    MR#:  [id number]  DATE OF BIRTH:  1955/07/06  SUBJECTIVE:   Patient here with increasing shortness of breath. Since she's been hospitalized she is feeling better.  REVIEW OF SYSTEMS:    Review of Systems  Constitutional: Negative for fever, chills and malaise/fatigue.  HENT: Negative for ear discharge, ear pain, hearing loss, nosebleeds and sore throat.   Eyes: Negative for blurred vision and pain.  Respiratory: Positive for shortness of breath. Negative for cough, hemoptysis and wheezing.   Cardiovascular: Negative for chest pain, palpitations and leg swelling.  Gastrointestinal: Negative for nausea, vomiting, abdominal pain, diarrhea and blood in stool.  Genitourinary: Negative for dysuria.  Musculoskeletal: Negative for back pain.  Neurological: Negative for dizziness, tremors, speech change, focal weakness, seizures and headaches.  Endo/Heme/Allergies: Does not bruise/bleed easily.  Psychiatric/Behavioral: Negative for depression, suicidal ideas and hallucinations.    Tolerating Diet:yes      DRUG ALLERGIES:   Allergies  Allergen Reactions  . Fish Allergy Anaphylaxis    Patient allergic to perch only. She can eat other fish.  . Sulfa Antibiotics Hives    VITALS:  Blood pressure 112/58, pulse 73, temperature 97.8 F (36.6 C), temperature source Oral, resp. rate 18, height '5\' 5"'$  (1.651 m), weight 90.266 kg (199 lb), SpO2 96 %.  PHYSICAL EXAMINATION:   Physical Exam  Constitutional: She is oriented to person, place, and time and well-developed, well-nourished, and in no distress. No distress.  HENT:  Head: Normocephalic.  Eyes: No scleral icterus.  Neck: Normal range of motion. Neck supple. No JVD present. No tracheal deviation present.  Cardiovascular: Normal rate, regular rhythm and normal heart sounds.  Exam reveals no gallop and no friction rub.   No murmur  heard. Pulmonary/Chest: Effort normal and breath sounds normal. No respiratory distress. She has no wheezes. She has no rales. She exhibits no tenderness.  Decreased breath sounds right base  Abdominal: Soft. Bowel sounds are normal. She exhibits no distension and no mass. There is no tenderness. There is no rebound and no guarding.  Musculoskeletal: Normal range of motion. She exhibits no edema.  Neurological: She is alert and oriented to person, place, and time.  Skin: Skin is warm. No rash noted. No erythema.  Psychiatric: Affect and judgment normal.      LABORATORY PANEL:   CBC  Recent Labs Lab 04/09/15 0502  WBC 6.7  HGB 8.0*  HCT 24.3*  PLT 182   ------------------------------------------------------------------------------------------------------------------  Chemistries   Recent Labs Lab 04/09/15 0502  NA 136  K 3.1*  CL 104  CO2 30  GLUCOSE 136*  BUN 9  CREATININE 0.85  CALCIUM 7.9*  AST 30  ALT 12*  ALKPHOS 115  BILITOT <0.1*   ------------------------------------------------------------------------------------------------------------------  Cardiac Enzymes  Recent Labs Lab 04/08/15 0929  TROPONINI <0.03   ------------------------------------------------------------------------------------------------------------------  RADIOLOGY:  Dg Chest 2 View  04/08/2015  CLINICAL DATA:  Increasing shortness of breath, recurrent right pleural effusion EXAM: CHEST  2 VIEW COMPARISON:  04/06/2015, 03/15/2015 FINDINGS: Stable large right pleural effusion with associated right lower lobe atelectasis. No interval change. Left lung remains clear. No pneumothorax. Stable heart size and vascularity. Chronic compression fractures of the lower thoracic spine. IMPRESSION: Stable large right pleural effusion and associated compressive atelectasis. No interval change compared to 04/06/2015 Electronically Signed   By: Jerilynn Mages.  Shick M.D.   On: 04/08/2015 09:30     ASSESSMENT  AND  PLAN:    60 y.o. female has a past medical history significant for COPD and bipolar D/O recently admitted for COPD exacerbation now with progressive SOB and enlarging right pleural effusion.  1. Recurrent right pleural effusion: Patient needs further assessment of the effusion. Dr. Genevive Bi is been consulted for Thorocoscopy with pleural biopsy.  continue Zosyn for now   consult pulmonary as per the request of patient.   2. COPD: Patient does not appear to be in exacerbation at this time. Continue inhalers, nebulizer 3. Bipolar operative disorder: Continue Abilify clonazepam, Cymbalta and Lexapro   3. Tobacco dependence: Patient has quit for 3 weeks. Continue nicotine patch and encouragement to remain abstinent.     gement plans discussed with the patient and she is in agreement.  CODE STATUS: FULL  TOTAL TIME TAKING CARE OF THIS PATIENT: 30 minutes.     POSSIBLE D/C 3-4 days, DEPENDING ON CLINICAL CONDITION.   Urania Pearlman M.D on 04/09/2015 at 11:02 AM  Between 7am to 6pm - Pager - [phone] After 6pm go to [email or web address] - password [email or web address] [facility]  Office  [phone]  CC: Primary care physician; [name], NP  Note: This dictation was prepared with Dragon dictation along with smaller phrase technology. Any transcriptional errors that result from this process are unintentional.

## 2015-04-09 NOTE — Progress Notes (Signed)
Pharmacy Consult for electrolyte replacement  Recent Labs  04/08/15 0929 04/09/15 0502  NA 133* 136  K 3.0* 3.1*  CL 100* 104  CO2 27 30  GLUCOSE 116* 136*  BUN 8 9  CREATININE 0.76 0.85  CALCIUM 7.7* 7.9*  MG  --  1.9  PROT 5.7* 5.3*  ALBUMIN 2.1* 1.9*  AST 43* 30  ALT 15 12*  ALKPHOS 135* 115  BILITOT 0.9 <0.1*   Estimated Creatinine Clearance: 79.1 mL/min (by C-G formula based on Cr of 0.85).   Plan:  Add on Mg resulted WNL. Will recheck K in the AM   Christinamarie Tall D Quinnetta Roepke, Pharm.D Clinical Pharmacist   04/09/2015,4:04 PM

## 2015-04-09 NOTE — Care Management (Signed)
Admitted to this facility with the diagnosis of COPD. History  of Stage II anus cancer.  Colostomy in place.  Lives with friends Rick & Selena Batten [phone]). Daughter is Stanton Kidney [phone]). Followed by [facility] in the past. Last seen [name] NP 04/03/15. Genevive Bi is Uses a rolling walker to aid in ambulation.  Dr. Genevive Bi is planning on a pleux cath/ talc pleurodesis procedure in the morning. Shelbie Ammons RN MSN CCM Care Management [phone]

## 2015-04-09 NOTE — Consult Note (Signed)
   [facility] CM Inpatient Consult   04/09/2015  HARLA MENSCH 1955/06/29 [id number]   Patient previously active with [facility]. Continues to be eligible with diagnosis of COPD, for post hospital discharge follow up. Patient was evaluated for community based chronic disease management services with [facility] care Management Program as a benefit of patient's [facility] Medicare. Met with the patient at the bedside to re-explain [facility] Care Management services. Patient endorses her primary care provider to be Rozann Lesches NP. Patient states she has a new address: [place], [address]. She stated her best contact number is [phone], and consents for [facility] to contact her friend Ms. Selena Batten @ [phone] in case she can not be reached.Patient states she was previously receiving [place] services from [facility] for skilled nursing visits. Consent form signed. Patient will receive post hospital discharge calls and be evaluated for monthly home visits. [facility] Care Management services does not interfere with or replace any services arranged by the inpatient care management team. RNCM left contact information and [facility] literature at the bedside. Made inpatient RNCM aware that [facility] will be following for care management. For additional questions please contact:   Langdon Crosson RN, [facility] Liaison  [phone]) Business Mobile [phone]) Toll free office

## 2015-04-09 NOTE — Progress Notes (Signed)
Pharmacy Consult for electrolyte replacement  Recent Labs  04/08/15 0929 04/09/15 0502  NA 133* 136  K 3.0* 3.1*  CL 100* 104  CO2 27 30  GLUCOSE 116* 136*  BUN 8 9  CREATININE 0.76 0.85  CALCIUM 7.7* 7.9*  PROT 5.7* 5.3*  ALBUMIN 2.1* 1.9*  AST 43* 30  ALT 15 12*  ALKPHOS 135* 115  BILITOT 0.9 <0.1*   Estimated Creatinine Clearance: 79.1 mL/min (by C-G formula based on Cr of 0.85).   Plan:  K low, currently receiving NS with 94mq of potassium at 7107mhr. Will give an additional 203mKCl PO x1. Check magnesium, repeat electrolytes with AM labs.   Sheyla Zaffino C 04/09/2015,3:17 PM

## 2015-04-10 ENCOUNTER — Inpatient Hospital Stay: Payer: Commercial Managed Care - HMO

## 2015-04-10 ENCOUNTER — Other Ambulatory Visit: Payer: Self-pay | Admitting: *Deleted

## 2015-04-10 ENCOUNTER — Encounter
Admission: EM | Disposition: A | Discharge status: Home Health | Payer: Self-pay | Source: Home / Self Care | Attending: Internal Medicine

## 2015-04-10 ENCOUNTER — Encounter: Payer: Self-pay | Admitting: Hematology and Oncology

## 2015-04-10 ENCOUNTER — Inpatient Hospital Stay: Payer: Commercial Managed Care - HMO | Admitting: Certified Registered Nurse Anesthetist

## 2015-04-10 ENCOUNTER — Encounter: Payer: Self-pay | Admitting: Anesthesiology

## 2015-04-10 ENCOUNTER — Inpatient Hospital Stay: Payer: Commercial Managed Care - HMO | Admitting: Hematology and Oncology

## 2015-04-10 HISTORY — PX: VIDEO ASSISTED THORACOSCOPY (VATS)/THOROCOTOMY: SHX6173

## 2015-04-10 LAB — BASIC METABOLIC PANEL
Anion gap: 3 — ABNORMAL LOW (ref 5–15)
BUN: 10 mg/dL (ref 6–20)
CHLORIDE: 105 mmol/L (ref 101–111)
CO2: 27 mmol/L (ref 22–32)
CREATININE: 0.86 mg/dL (ref 0.44–1.00)
Calcium: 7.9 mg/dL — ABNORMAL LOW (ref 8.9–10.3)
GFR calc Af Amer: >60 mL/min (ref >60)
GFR calc non Af Amer: >60 mL/min (ref >60)
Glucose, Bld: 101 mg/dL — ABNORMAL HIGH (ref 65–99)
Potassium: 4.6 mmol/L (ref 3.5–5.1)
Sodium: 135 mmol/L (ref 135–145)

## 2015-04-10 LAB — CBC
HEMATOCRIT: 25 % — AB (ref 35.0–47.0)
HEMOGLOBIN: 8.2 g/dL — AB (ref 12.0–16.0)
MCH: 29.3 pg (ref 26.0–34.0)
MCHC: 32.7 g/dL (ref 32.0–36.0)
MCV: 89.5 fL (ref 80.0–100.0)
Platelets: 234 10*3/uL (ref 150–440)
RBC: 2.8 MIL/uL — ABNORMAL LOW (ref 3.80–5.20)
RDW: 17.2 % — ABNORMAL HIGH (ref 11.5–14.5)
WBC: 9.1 10*3/uL (ref 3.6–11.0)

## 2015-04-10 LAB — GLUCOSE, CAPILLARY
Glucose-Capillary: 93 mg/dL (ref 65–99)
Glucose-Capillary: 135 mg/dL — ABNORMAL HIGH (ref 65–99)

## 2015-04-10 SURGERY — VIDEO ASSISTED THORACOSCOPY (VATS)/THOROCOTOMY
Anesthesia: General | Laterality: Right

## 2015-04-10 MED ORDER — PHENYLEPHRINE HCL 10 MG/ML IJ SOLN
INTRAMUSCULAR | Status: DC | PRN
  Administered 2015-04-10: 200 ug via INTRAVENOUS
  Administered 2015-04-10: 300 ug via INTRAVENOUS

## 2015-04-10 MED ORDER — STERILE WATER FOR IRRIGATION IR SOLN
Status: DC | PRN
  Administered 2015-04-10: 1700 mL

## 2015-04-10 MED ORDER — DEXTROSE-NACL 5-0.45 % IV SOLN
INTRAVENOUS | Status: DC
  Administered 2015-04-10 – 2015-04-14 (×6): via INTRAVENOUS

## 2015-04-10 MED ORDER — ONDANSETRON HCL 4 MG/2ML IJ SOLN: 4.0000 mg | Freq: Once | INTRAMUSCULAR | Status: DC | PRN

## 2015-04-10 MED ORDER — CEFAZOLIN SODIUM 1 G IJ SOLR
INTRAMUSCULAR | Status: DC | PRN
  Administered 2015-04-10: 1 g via INTRAMUSCULAR

## 2015-04-10 MED ORDER — METHYLPREDNISOLONE SODIUM SUCC 125 MG IJ SOLR
INTRAMUSCULAR | Status: DC | PRN
  Administered 2015-04-10: 125 mg via INTRAVENOUS

## 2015-04-10 MED ORDER — FENTANYL CITRATE (PF) 100 MCG/2ML IJ SOLN: 25.0000 ug | INTRAMUSCULAR | Status: DC | PRN

## 2015-04-10 MED ORDER — DEXAMETHASONE SODIUM PHOSPHATE 4 MG/ML IJ SOLN
INTRAMUSCULAR | Status: DC | PRN
  Administered 2015-04-10: 5 mg via INTRAVENOUS

## 2015-04-10 MED ORDER — MINERAL OIL LIGHT 100 % EX OIL
TOPICAL_OIL | CUTANEOUS | Status: AC
  Filled 2015-04-10: qty 25

## 2015-04-10 MED ORDER — PROPOFOL 10 MG/ML IV BOLUS
INTRAVENOUS | Status: DC | PRN
  Administered 2015-04-10: 120 mg via INTRAVENOUS

## 2015-04-10 MED ORDER — SUCCINYLCHOLINE CHLORIDE 20 MG/ML IJ SOLN
INTRAMUSCULAR | Status: DC | PRN
  Administered 2015-04-10: 100 mg via INTRAVENOUS

## 2015-04-10 MED ORDER — TALC 5 G PL SUSR
INTRAPLEURAL | Status: AC
  Filled 2015-04-10: qty 5

## 2015-04-10 MED ORDER — FENTANYL CITRATE (PF) 100 MCG/2ML IJ SOLN
INTRAMUSCULAR | Status: DC | PRN
  Administered 2015-04-10 (×2): 100 ug via INTRAVENOUS
  Administered 2015-04-10: 150 ug via INTRAVENOUS

## 2015-04-10 MED ORDER — LIDOCAINE HCL (CARDIAC) 20 MG/ML IV SOLN
INTRAVENOUS | Status: DC | PRN
  Administered 2015-04-10: 100 mg via INTRAVENOUS

## 2015-04-10 MED ORDER — BUPIVACAINE LIPOSOME 1.3 % IJ SUSP
INTRAMUSCULAR | Status: AC
  Filled 2015-04-10: qty 20

## 2015-04-10 MED ORDER — DEXTROSE 5 % IV SOLN
1.5000 g | Freq: Two times a day (BID) | INTRAVENOUS | Status: AC
  Administered 2015-04-10 – 2015-04-11 (×2): 1.5 g via INTRAVENOUS
  Filled 2015-04-10 (×2): qty 1.5

## 2015-04-10 MED ORDER — ROCURONIUM BROMIDE 100 MG/10ML IV SOLN
INTRAVENOUS | Status: DC | PRN
  Administered 2015-04-10: 50 mg via INTRAVENOUS
  Administered 2015-04-10: 30 mg via INTRAVENOUS

## 2015-04-10 MED ORDER — SUGAMMADEX SODIUM 500 MG/5ML IV SOLN
INTRAVENOUS | Status: DC | PRN
  Administered 2015-04-10: 350 mg via INTRAVENOUS

## 2015-04-10 MED ORDER — SODIUM CHLORIDE 0.9 % IJ SOLN
INTRAMUSCULAR | Status: AC
  Filled 2015-04-10: qty 50

## 2015-04-10 MED ORDER — ALBUTEROL SULFATE HFA 108 (90 BASE) MCG/ACT IN AERS
INHALATION_SPRAY | RESPIRATORY_TRACT | Status: DC | PRN
  Administered 2015-04-10: 4 via RESPIRATORY_TRACT

## 2015-04-10 MED ORDER — BUPIVACAINE HCL (PF) 0.25 % IJ SOLN
INTRAMUSCULAR | Status: AC
  Filled 2015-04-10: qty 30

## 2015-04-10 MED ORDER — SODIUM CHLORIDE 0.9 % IV SOLN
INTRAVENOUS | Status: DC
  Administered 2015-04-10: 09:00:00 via INTRAVENOUS

## 2015-04-10 MED ORDER — HEPARIN SODIUM (PORCINE) 5000 UNIT/ML IJ SOLN
INTRAMUSCULAR | Status: AC
  Filled 2015-04-10: qty 1

## 2015-04-10 MED ORDER — ONDANSETRON HCL 4 MG/2ML IJ SOLN
INTRAMUSCULAR | Status: DC | PRN
  Administered 2015-04-10: 4 mg via INTRAVENOUS

## 2015-04-10 MED ORDER — PHENYLEPHRINE HCL 10 MG/ML IJ SOLN
10.0000 mg | INTRAVENOUS | Status: DC | PRN
  Administered 2015-04-10: 30 ug/min via INTRAVENOUS

## 2015-04-10 MED ORDER — LACTATED RINGERS IV SOLN
INTRAVENOUS | Status: DC | PRN
  Administered 2015-04-10: 15:00:00 via INTRAVENOUS

## 2015-04-10 MED ORDER — MIDAZOLAM HCL 2 MG/2ML IJ SOLN
INTRAMUSCULAR | Status: DC | PRN
  Administered 2015-04-10: 2 mg via INTRAVENOUS

## 2015-04-10 SURGICAL SUPPLY — 73 items
BENZOIN TINCTURE PRP APPL 2/3 (GAUZE/BANDAGES/DRESSINGS) ×4 IMPLANT
BNDG COHESIVE 4X5 TAN STRL (GAUZE/BANDAGES/DRESSINGS) ×2 IMPLANT
BRONCHOSCOPE PED SLIM DISP (MISCELLANEOUS) ×2 IMPLANT
CANISTER SUCT 1200ML W/VALVE (MISCELLANEOUS) ×2 IMPLANT
CANISTER SUCT 3000ML PPV (MISCELLANEOUS) ×2 IMPLANT
CATH TRAY 16F METER LATEX (MISCELLANEOUS) ×2 IMPLANT
CATH URET ROBINSON 16FR STRL (CATHETERS) IMPLANT
CHLORAPREP W/TINT 26ML (MISCELLANEOUS) ×4 IMPLANT
CNTNR SPEC 2.5X3XGRAD LEK (MISCELLANEOUS) ×3
CONN REDUCER 1/4X3/8 STR (CONNECTOR) ×2
CONNECTOR REDUCER 1/4X3/8 STR (CONNECTOR) ×1 IMPLANT
CONT SPEC 4OZ STER OR WHT (MISCELLANEOUS) ×3
CONTAINER SPEC 2.5X3XGRAD LEK (MISCELLANEOUS) ×3 IMPLANT
CUTTER ECHEON FLEX ENDO 45 340 (ENDOMECHANICALS) IMPLANT
DEFOGGER SCOPE WARMER CLEARIFY (MISCELLANEOUS) ×2 IMPLANT
DRAIN CHANNEL 28F RND 3/8 FF (WOUND CARE) ×2 IMPLANT
DRAIN CHEST DRY SUCT SGL (MISCELLANEOUS) ×2 IMPLANT
DRAPE C-SECTION (MISCELLANEOUS) ×2 IMPLANT
DRAPE MAG INST 16X20 L/F (DRAPES) ×2 IMPLANT
DRESSING TELFA 4X3 1S ST N-ADH (GAUZE/BANDAGES/DRESSINGS) ×4 IMPLANT
DRSG OPSITE POSTOP 3X4 (GAUZE/BANDAGES/DRESSINGS) IMPLANT
DRSG OPSITE POSTOP 4X6 (GAUZE/BANDAGES/DRESSINGS) IMPLANT
DRSG OPSITE POSTOP 4X8 (GAUZE/BANDAGES/DRESSINGS) IMPLANT
DRSG TEGADERM 2-3/8X2-3/4 SM (GAUZE/BANDAGES/DRESSINGS) ×6 IMPLANT
DRSG TEGADERM 4X4.75 (GAUZE/BANDAGES/DRESSINGS) ×2 IMPLANT
DRSG TELFA 3X8 NADH (GAUZE/BANDAGES/DRESSINGS) ×2 IMPLANT
ELECT BLADE 6 FLAT ULTRCLN (ELECTRODE) ×2 IMPLANT
ELECT BLADE 6.5 EXT (BLADE) ×2 IMPLANT
ELECT CAUTERY BLADE TIP 2.5 (TIP) ×2
ELECT REM PT RETURN 9FT ADLT (ELECTROSURGICAL) ×2
ELECTRODE CAUTERY BLDE TIP 2.5 (TIP) ×1 IMPLANT
ELECTRODE REM PT RTRN 9FT ADLT (ELECTROSURGICAL) ×1 IMPLANT
ETHILON ×2 IMPLANT
GAUZE SPONGE 4X4 12PLY STRL (GAUZE/BANDAGES/DRESSINGS) ×2 IMPLANT
GLOVE EXAM LX STRL 7.5 (GLOVE) ×6 IMPLANT
GOWN STRL REUS W/ TWL LRG LVL3 (GOWN DISPOSABLE) ×5 IMPLANT
GOWN STRL REUS W/TWL LRG LVL3 (GOWN DISPOSABLE) ×5
KIT RM TURNOVER STRD PROC AR (KITS) ×2 IMPLANT
LABEL OR SOLS (LABEL) ×2 IMPLANT
LOOP RED MAXI  1X406MM (MISCELLANEOUS)
LOOP VESSEL MAXI 1X406 RED (MISCELLANEOUS) IMPLANT
MARKER SKIN DUAL TIP RULER LAB (MISCELLANEOUS) ×2 IMPLANT
NEEDLE SPNL 20GX3.5 QUINCKE YW (NEEDLE) ×2 IMPLANT
NS IRRIG 500ML POUR BTL (IV SOLUTION) IMPLANT
PACK BASIN MAJOR ARMC (MISCELLANEOUS) ×2 IMPLANT
RELOAD STAPLER LINE PROX 30 GR (STAPLE) IMPLANT
SCISSORS METZENBAUM CVD 33 (INSTRUMENTS) IMPLANT
SPONGE KITTNER 5P (MISCELLANEOUS) ×6 IMPLANT
STAPLER RELOAD LINE PROX 30 GR (STAPLE)
STAPLER SKIN PROX 35W (STAPLE) IMPLANT
STAPLER VASCULAR ECHELON 35 (CUTTER) IMPLANT
STRIP CLOSURE SKIN 1/2X4 (GAUZE/BANDAGES/DRESSINGS) ×2 IMPLANT
SUT ETHILON 4-0 (SUTURE) ×1
SUT ETHILON 4-0 FS2 18XMFL BLK (SUTURE) ×1
SUT MNCRL AB 3-0 PS2 27 (SUTURE) IMPLANT
SUT SILK 0 (SUTURE)
SUT SILK 0 30XBRD TIE 6 (SUTURE) IMPLANT
SUT SILK 1 SH (SUTURE) ×4 IMPLANT
SUT VIC AB 0 CT1 36 (SUTURE) IMPLANT
SUT VIC AB 2-0 CT1 27 (SUTURE) ×1
SUT VIC AB 2-0 CT1 TAPERPNT 27 (SUTURE) ×1 IMPLANT
SUT VIC AB 2-0 CT2 27 (SUTURE) ×2 IMPLANT
SUT VICRYL 2 TP 1 (SUTURE) IMPLANT
SUTURE ETHLN 4-0 FS2 18XMF BLK (SUTURE) ×1 IMPLANT
SYR 10ML SLIP (SYRINGE) ×2 IMPLANT
SYR BULB IRRIG 60ML STRL (SYRINGE) ×4 IMPLANT
TAPE ADH 3 LX (MISCELLANEOUS) ×2 IMPLANT
TAPE TRANSPORE STRL 2 31045 (GAUZE/BANDAGES/DRESSINGS) ×2 IMPLANT
TROCAR FLEXIPATH 20X80 (ENDOMECHANICALS) ×4 IMPLANT
TROCAR FLEXIPATH THORACIC 15MM (ENDOMECHANICALS) IMPLANT
TUBING CONNECTING 10 (TUBING) ×2 IMPLANT
WATER STERILE IRR 1000ML POUR (IV SOLUTION) ×4 IMPLANT
YANKAUER SUCT BULB TIP FLEX NO (MISCELLANEOUS) ×2 IMPLANT

## 2015-04-10 NOTE — Patient Outreach (Signed)
[facility]) Care Management  04/10/2015  [name] 02/01/1955 [id number]  Received in-basket, patient referred to [facility] for transition of care due to recent hospitalization.  Objective: Per Epic case review: Patient hospitalized on 04/08/15.  Assessment: Received MD referral on 04/06/15.   Referral source: [name] FNP.   Diagnosis: COPD, hypertension, and cancer.   Patient will be followed by [facility] for transition of care and no Telephonic RNCM needs at this time.     Plan: No further Telephonic RNCM needs at this time.   Giovanie Lefebre H. Annia Friendly, BSN, [place] Management [facility] Telephonic CM Phone: [phone] Fax: [phone]

## 2015-04-10 NOTE — Care Management Important Message (Signed)
Important Message  Patient Details  Name: ELINE GENG MRN: [id number] Date of Birth: 1955/10/04   Medicare Important Message Given:  Yes    Juliann Pulse A Gaige Fussner 04/10/2015, 10:26 AM

## 2015-04-10 NOTE — Op Note (Signed)
  04/08/2015 - 04/10/2015  4:20 PM  PATIENT:  [name]  60 y.o. female  PRE-OPERATIVE DIAGNOSIS:  Malignant pleural effusion right side  POST-OPERATIVE DIAGNOSIS:  Same  PROCEDURE:  #1 preoperative bronchoscopy to assess endobronchial anatomy #2 right thoracoscopy with drainage of loculated right pleural effusion #3 pleural biopsy #4 talc pleurodesis  SURGEON:  Surgeon(s) and Role:    * Nestor Lewandowsky, MD - Primary    * Diego Sarita Haver, MD - Assisting  ASSISTANTS: Dr. Bea Graff on  ANESTHESIA: Gen.  INDICATIONS FOR PROCEDURE this patient is a 60 year old woman with a history of anal carcinoma. She's had several admissions to the hospital recently for recurrent right pleural effusion. A CT scan confirmed the presence of a loculated pleural effusion with possible pleural metastases. She was apprised of the indications and the risks and she gave her informed consent.  DICTATION: The patient was brought to the operating suite and placed in the supine position. General endotracheal anesthesia was given through a double-lumen tube. Preoperative bronchoscopy was carried out. There is no evidence of tumor obstructing the major airways. On the right side there was some densely adherent mucus that appeared at first glance to represent some submucosal tumor but was able to be removed with the bronchoscope. The patient was then turned for right thoracoscopy.  The patient was prepped and draped in usual sterile fashion. A 3 cm long incision was made at approximately the sixth intercostal space. A 20 mm port was placed after the right lung was deflated. Upon entering the chest was approximately 500 cc of serosanguineous fluid present. A scope was placed and we could see that there is a multiloculated pleural effusion. Using a long curved clamp were able to break up most of these adhesions and drain the pleural fluid. There were several very suspicious areas along the pleural surface for tumor. Biopsies were  obtained using a cupped biopsy forceps. Frozen section returned lesion all cells not otherwise specified. 5 g of sterile talc were then insufflated under direct visualization coating the entire pleural space. The chest was drained with a 28 Blake tube placed along the paravertebral space and brought out through a separate stab wound. It should be noted that we did make an additional port site posteriorly in approximately the same interspace to allow for more complete adhesive lysis.  All the wounds were then closed with 0 Vicryl on the muscle 2-0 Vicryl on the subcutaneous tissues and 4-0 nylon on the skin.   Sterile dressings were then applied. The patient was extubated and taken to the recovery room in stable condition.  Nestor Lewandowsky, MD  04/08/2015 - 04/10/2015  4:21 PM  PATIENT:

## 2015-04-10 NOTE — Progress Notes (Signed)
Pharmacy Consult for electrolyte replacement  Recent Labs  04/08/15 0929 04/09/15 0502 04/10/15 0508  NA 133* 136 135  K 3.0* 3.1* 4.6  CL 100* 104 105  CO2 '27 30 27  '$ GLUCOSE 116* 136* 101*  BUN '8 9 10  '$ CREATININE 0.76 0.85 0.86  CALCIUM 7.7* 7.9* 7.9*  MG  --  1.9  --   PROT 5.7* 5.3*  --   ALBUMIN 2.1* 1.9*  --   AST 43* 30  --   ALT 15 12*  --   ALKPHOS 135* 115  --   BILITOT 0.9 <0.1*  --    Estimated Creatinine Clearance: 78.7 mL/min (by C-G formula based on Cr of 0.86).   Plan:  Potassium WNL,NS with 40k changed to normal saline per Dr. Benjie Karvonen. No supplementation needed at this time, will recheck with AM labs  Jordan Pardini C 04/10/2015,1:22 PM

## 2015-04-10 NOTE — Anesthesia Preprocedure Evaluation (Signed)
Anesthesia Evaluation  Patient identified by MRN, date of birth, ID band Patient awake    Reviewed: Allergy & Precautions, H&P , NPO status , Patient's Chart, lab work & pertinent test results, reviewed documented beta blocker date and time   Airway Mallampati: III  TM Distance: >3 FB Neck ROM: full    Dental no notable dental hx. (+) Edentulous Upper, Upper Dentures, Poor Dentition, Chipped, Missing   Pulmonary neg pulmonary ROS, shortness of breath, with exertion, at rest and lying, asthma , sleep apnea , pneumonia, resolved, COPD,  COPD inhaler, neg recent URI, former smoker,    Pulmonary exam normal breath sounds clear to auscultation       Cardiovascular Exercise Tolerance: Good hypertension, (-) angina+ Peripheral Vascular Disease  (-) CAD, (-) Past MI, (-) Cardiac Stents and (-) CABG Normal cardiovascular exam(-) dysrhythmias (-) Valvular Problems/Murmurs Rhythm:regular Rate:Normal - Diastolic murmurs    Neuro/Psych neg Seizures PSYCHIATRIC DISORDERS (Schizophrenia, bipolar, PTSD)  Neuromuscular disease (fibromyalgia)    GI/Hepatic Neg liver ROS, PUD, GERD  ,  Endo/Other  neg diabetesHypothyroidism   Renal/GU negative Renal ROS  negative genitourinary   Musculoskeletal   Abdominal   Peds  Hematology negative hematology ROS (+)   Anesthesia Other Findings Past Medical History:   Schizophrenia ([place])                                          Asthma                                                       GERD (gastroesophageal reflux disease)                       Anxiety                                                      Depression                                                   Bipolar disorder (HCC)                                       COPD (chronic obstructive pulmonary disease) (*              Occasional tremors                                             Comment:right hand   PTSD (post-traumatic stress  disorder)                        Shortness of breath dyspnea  Fibromyalgia                                                 DVT (deep venous thrombosis) ([place])              2011           Comment:RUE   Thyroid nodule                                               DDD (degenerative disc disease), lumbar                      Spinal stenosis                                              Peripheral neuropathy (HCC)                                  Rotator cuff tear                                              Comment:right   Pneumonia                                       2011         Hypothyroidism                                                 Comment:no meds currently   Anemia                                                         Comment:during pregnancy only   Hypertension                                                   Comment:Off meds x 15 years-well controlled now per pt   Squamous cell cancer, 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[place])                             Severe obstructive sleep apnea                  06/27/2014    DVT of upper extremity (deep vein thrombosis) * 10/14/2014    Reproductive/Obstetrics negative OB ROS  Anesthesia Physical Anesthesia Plan  ASA: IV  Anesthesia Plan: General   Post-op Pain Management:    Induction:   Airway Management Planned:   Additional Equipment:   Intra-op Plan:   Post-operative Plan:   Informed Consent: I have reviewed the patients History and Physical, chart, labs and discussed the procedure including the risks, benefits and alternatives for the proposed anesthesia with the patient or authorized representative who has indicated his/her understanding and acceptance.   Dental Advisory Given  Plan Discussed with: Anesthesiologist, CRNA and Surgeon  Anesthesia Plan Comments:         Anesthesia Quick Evaluation

## 2015-04-10 NOTE — H&P (View-Only) (Signed)
Patient ID: [name], female   DOB: [birth year], 60 y.o.   MRN: [id number]  Chief Complaint  Patient presents with  . Shortness of Breath    Referred By Dr. Louie Bun Reason for Referral right pleural effusion  HPI Location, Quality, Duration, Severity, Timing, Context, Modifying Factors, Associated Signs and Symptoms.  [name] is a 60 y.o. female.  She has been admitted to the hospital 3 times over the last several weeks with complaints of right-sided chest pain and shortness of breath. She's been diagnosed with a recurrent pleural effusion and recently saw Dr. Louie Bun for evaluation. A CT scan performed within the last several weeks that shown multiple nodules along the right pleural surface and a moderate size right pleural effusion. The patient did have a thoracentesis performed at one point and did feel somewhat improved after that. However over the last week or 2 she's experienced increasing right-sided chest wall pain particularly with cough and sneezing. In addition she's noticed increasing shortness of breath. All these symptoms prompted her admission to the hospital through our emergency room. She denied any fevers. She denied any chills. She denied any sputum production. She has seen Dr. Mike Gip and Dr. Donella Stade in the past for her anal carcinoma. She's also been seen multiple times by [facility] for her diverging colostomy and also for potential reversal of her colostomy. Most recently she has seen Dr. Phoebe Perch who has evaluated her for possible colostomy takedown. However the results of the upcoming fluid analysis on her pleural space will certainly dictate this.   Past Medical History  Diagnosis Date  . Schizophrenia ([place])   . Asthma   . GERD (gastroesophageal reflux disease)   . Anxiety   . Depression   . Bipolar disorder ([place])   . COPD (chronic obstructive pulmonary disease) ([place])   . Occasional tremors     right hand  . PTSD  (post-traumatic stress disorder)   . Shortness of breath dyspnea   . Fibromyalgia   . DVT (deep venous thrombosis) ([place]) 2011    RUE  . Thyroid nodule   . DDD (degenerative disc disease), lumbar   . Spinal stenosis   . Peripheral neuropathy ([place])   . Rotator cuff tear     right  . Pneumonia 2011  . Hypothyroidism     no meds currently  . Anemia     during pregnancy only  . Hypertension     Off meds x 15 years-well controlled now per pt  . Squamous cell cancer, anus (HCC)   . Severe obstructive sleep apnea 06/27/2014  . DVT of upper extremity (deep vein thrombosis) ([place]) 10/14/2014    Past Surgical History  Procedure Laterality Date  . Foot surgery Right   . Tubal ligation    . Eye surgery Bilateral   . Mouth surgery  2002  . Rectal biopsy N/A 05/08/2014    Procedure: BIOPSY RECTAL;  Surgeon: Marlyce Huge, MD;  Location: [facility] ORS;  Service: General;  Laterality: N/A;  . Evaluation under anesthesia with hemorrhoidectomy N/A 05/08/2014    Procedure: EXAM UNDER ANESTHESIA WITH HEMORRHOIDECTOMY;  Surgeon: Marlyce Huge, MD;  Location: [facility] ORS;  Service: General;  Laterality: N/A;  . Portacath placement N/A 05/20/2014    Procedure: INSERTION PORT-A-CATH;  Surgeon: Florene Glen, MD;  Location: [facility] ORS;  Service: General;  Laterality: N/A;  . Laparoscopic diverted colostomy N/A 05/26/2014    Procedure: LAPAROSCOPIC DIVERTED COLOSTOMY;  Surgeon: Marlyce Huge, MD;  Location: [facility] ORS;  Service: General;  Laterality: N/A;  . Peripheral vascular catheterization Left 10/16/2014    Procedure: Upper Extremity Venography with thrombectomy, port removal;  Surgeon: Algernon Huxley, MD;  Location: [place] CV LAB;  Service: Cardiovascular;  Laterality: Left;  . Peripheral vascular catheterization  10/16/2014    Procedure: Upper Extremity Intervention;  Surgeon: Algernon Huxley, MD;  Location: [place] [facility];  Service: Cardiovascular;;    Family History  Problem  Relation Age of Onset  . Cancer Maternal Aunt   . Cancer Paternal Uncle   . Diabetes Mother   . Thyroid disease Mother   . Kidney failure Mother   . Hypertension Mother   . Depression Mother   . Mental illness Son   . Aneurysm Maternal Grandmother   . Thyroid disease Maternal Grandmother   . Stroke Maternal Grandfather   . Hypertension Maternal Grandfather   . Diabetes Maternal Grandfather   . Heart disease Maternal Grandfather     MI  . Nephrolithiasis Daughter     Social History Social History  Substance Use Topics  . Smoking status: Former Smoker -- 0.10 packs/day for 44 years    Types: Cigarettes    Start date: 10/04/1970  . Smokeless tobacco: Never Used  . Alcohol Use: No     Comment: 1 drink every 2-3 times/year    Allergies  Allergen Reactions  . Fish Allergy Anaphylaxis    Patient allergic to perch only. She can eat other fish.  . Sulfa Antibiotics Hives    Current Facility-Administered Medications  Medication Dose Route Frequency Provider Last Rate Last Dose  . 0.9 % NaCl with KCl 40 mEq / L  infusion   Intravenous Continuous Idelle Crouch, MD 75 mL/hr at 04/08/15 2206 75 mL/hr at 04/08/15 2206  . acetaminophen (TYLENOL) tablet 650 mg  650 mg Oral Q6H PRN Idelle Crouch, MD       Or  . acetaminophen (TYLENOL) suppository 650 mg  650 mg Rectal Q6H PRN Idelle Crouch, MD      . ARIPiprazole (ABILIFY) tablet 2 mg  2 mg Oral Daily Idelle Crouch, MD   2 mg at 04/09/15 2263  . aspirin EC tablet 81 mg  81 mg Oral Daily Idelle Crouch, MD   81 mg at 04/09/15 3354  . baclofen (LIORESAL) tablet 10 mg  10 mg Oral TID PRN Idelle Crouch, MD      . bisacodyl (DULCOLAX) suppository 10 mg  10 mg Rectal Daily PRN Idelle Crouch, MD      . cholecalciferol (VITAMIN D) tablet 1,000 Units  1,000 Units Oral Daily Idelle Crouch, MD   1,000 Units at 04/09/15 [phone]  . clonazePAM (KLONOPIN) tablet 1 mg  1 mg Oral TID PRN Idelle Crouch, MD      . docusate sodium  (COLACE) capsule 100 mg  100 mg Oral BID Idelle Crouch, MD   100 mg at 04/09/15 6389  . DULoxetine (CYMBALTA) DR capsule 60 mg  60 mg Oral BH-q7a Idelle Crouch, MD   60 mg at 04/09/15 3734  . escitalopram (LEXAPRO) tablet 20 mg  20 mg Oral Daily Idelle Crouch, MD   20 mg at 04/09/15 [phone]  . folic acid (FOLVITE) tablet 1 mg  1 mg Oral Daily Idelle Crouch, MD   1 mg at 04/09/15 8115  . heparin injection 5,000 Units  5,000 Units Subcutaneous 3 times per day Leonie Douglas  Sparks, MD   5,000 Units at 04/09/15 [phone]  . HYDROmorphone (DILAUDID) injection 1 mg  1 mg Intravenous Q4H PRN Idelle Crouch, MD      . ipratropium-albuterol (DUONEB) 0.5-2.5 (3) MG/3ML nebulizer solution 3 mL  3 mL Nebulization QID Idelle Crouch, MD   3 mL at 04/09/15 0943  . mometasone-formoterol (DULERA) 200-5 MCG/ACT inhaler 2 puff  2 puff Inhalation BID Idelle Crouch, MD   2 puff at 04/08/15 2007  . nicotine (NICODERM CQ - dosed in mg/24 hours) patch 21 mg  21 mg Transdermal Daily Idelle Crouch, MD   21 mg at 04/09/15 0835  . ondansetron (ZOFRAN) tablet 4 mg  4 mg Oral Q6H PRN Idelle Crouch, MD       Or  . ondansetron [facility]) injection 4 mg  4 mg Intravenous Q6H PRN Idelle Crouch, MD      . oxyCODONE-acetaminophen (PERCOCET/ROXICET) 5-325 MG per tablet 1 tablet  1 tablet Oral Q8H PRN Idelle Crouch, MD   1 tablet at 04/09/15 0700   And  . oxyCODONE (Oxy IR/ROXICODONE) immediate release tablet 5 mg  5 mg Oral Q8H PRN Idelle Crouch, MD   5 mg at 04/09/15 0700  . pantoprazole (PROTONIX) EC tablet 40 mg  40 mg Oral BID Idelle Crouch, MD   40 mg at 04/09/15 5366  . piperacillin-tazobactam (ZOSYN) IVPB 3.375 g  3.375 g Intravenous 3 times per day Idelle Crouch, MD   3.375 g at 04/09/15 0641  . polyethylene glycol (MIRALAX / GLYCOLAX) packet 17 g  17 g Oral Daily Idelle Crouch, MD   17 g at 04/09/15 0830  . pregabalin (LYRICA) capsule 200 mg  200 mg Oral BID Idelle Crouch, MD   200 mg at  04/09/15 4403  . sodium chloride flush (NS) 0.9 % injection 3 mL  3 mL Intravenous Q12H Idelle Crouch, MD   3 mL at 04/08/15 1415  . tiotropium (SPIRIVA) inhalation capsule 18 mcg  18 mcg Inhalation Daily Idelle Crouch, MD   18 mcg at 04/08/15 1600  . traZODone (DESYREL) tablet 50 mg  50 mg Oral QHS PRN Idelle Crouch, MD      . zolpidem [facility]) tablet 10 mg  10 mg Oral QHS PRN Idelle Crouch, MD   10 mg at 04/08/15 2205   Facility-Administered Medications Ordered in Other Encounters  Medication Dose Route Frequency Provider Last Rate Last Dose  . heparin lock flush 100 unit/mL  500 Units Intravenous Once Lequita Asal, MD      . sodium chloride 0.9 % injection 10 mL  10 mL Intravenous PRN Lequita Asal, MD          Review of Systems A complete review of systems was asked and was negative except for the following positive findingsIncreasing shortness of breath, right-sided chest pain, occasional blood in her stoma bag.  Blood pressure 112/58, pulse 73, temperature 97.8 F (36.6 C), temperature source Oral, resp. rate 18, height '5\' 5"'$  (1.651 m), weight 199 lb (90.266 kg), SpO2 96 %.  Physical Exam CONSTITUTIONAL:  Pleasant, well-developed, well-nourished, and in no acute distress. EYES: Pupils equal and reactive to light, Sclera non-icteric EARS, NOSE, MOUTH AND THROAT:  The oropharynx was clear.  Dentition is good repair.  Oral mucosa pink and moist. LYMPH NODES:  Lymph nodes in the neck and axillae were normal RESPIRATORY:  Lungs demonstrated rales at the left base and  markedly diminished breath sounds on the right..  Normal respiratory effort without pathologic use of accessory muscles of respiration CARDIOVASCULAR: Heart was regular without murmurs.  There were no carotid bruits. GI: The abdomen was soft, nontender, and nondistended. There were no palpable masses. There was no hepatosplenomegaly. There were normal bowel sounds in all quadrants. GU:  Rectal  deferred.  There is a functioning stoma in the left lower quadrant with brown stool within the bag. MUSCULOSKELETAL:  Normal muscle strength and tone.  No clubbing or cyanosis.   SKIN:  There were no pathologic skin lesions.  There were no nodules on palpation. NEUROLOGIC:  Sensation is normal.  Cranial nerves are grossly intact. PSYCH:  Oriented to person, place and time.  Mood and affect are normal.  Data Reviewed Chest x-rays and CT scans  I have personally reviewed the patient's imaging, laboratory findings and medical records.    Assessment    I have independently reviewed the patient's CT scan. 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 I base this upon the recurrent nature of the fluid and the nodularity seen on the CT scan.    Plan    I discussed with her the options including thoracentesis, Pleurx catheter insertion, and finally we discussed talc pleurodesis with pleural biopsy. I explained her the indications and risks of these procedures. I told her that'll be my intention to perform a thoracoscopic talc pleurodesis with pleural biopsy. She understood and would like to proceed. I did explain to her that there is the possibility that we may put a Pleurx catheter in as well. She understood the ramifications of that. Risks of bleeding, infection and death were all reviewed. She would like to proceed as soon as possible. I will alert Dr. Mike Gip to her presence in the hospital so that she can help [name] coordinate care.       Nestor Lewandowsky, MD 04/09/2015, 11:09 AM

## 2015-04-10 NOTE — Interval H&P Note (Signed)
History and Physical Interval Note:  04/10/2015 12:44 PM  [name]  has presented today for surgery, with the diagnosis of A  The various methods of treatment have been discussed with the patient and family. After consideration of risks, benefits and other options for treatment, the patient has consented to  Procedure(s): VIDEO ASSISTED THORACOSCOPY (VATS)/THOROCOTOMY (Right) THORACOTOMY MAJOR / TALC PLEURODESIS (Right) as a surgical intervention .  The patient's history has been reviewed, patient examined, no change in status, stable for surgery.  I have reviewed the patient's chart and labs.  Questions were answered to the patient's satisfaction.     Nestor Lewandowsky

## 2015-04-10 NOTE — Anesthesia Procedure Notes (Signed)
Procedure Name: Intubation Date/Time: 04/10/2015 1:42 PM Performed by: Rosaria Ferries, Aul Mangieri Pre-anesthesia Checklist: Patient identified, Emergency Drugs available, Suction available and Patient being monitored Patient Re-evaluated:Patient Re-evaluated prior to inductionOxygen Delivery Method: Circle system utilized Preoxygenation: Pre-oxygenation with 100% oxygen Intubation Type: IV induction Laryngoscope Size: Mac and 3 Grade View: Grade I Endobronchial tube: Left, Double lumen EBT and EBT position confirmed by fiberoptic bronchoscope and 37 Fr Number of attempts: 1 Placement Confirmation: ETT inserted through vocal cords under direct vision,  positive ETCO2 and breath sounds checked- equal and bilateral Secured at: 20 cm Tube secured with: Tape Dental Injury: Teeth and Oropharynx as per pre-operative assessment

## 2015-04-10 NOTE — Anesthesia Postprocedure Evaluation (Signed)
Anesthesia Post Note  Patient: [name]  Procedure(s) Performed: Procedure(s) (LRB): PRE-OP BRONCHOSCOPY, VIDEO ASSISTED THORACOSCOPY RIGHT WITH PLEURAL BIOPSIES, & PLEURAL DESIS (Right)  Patient location during evaluation: PACU Anesthesia Type: General Level of consciousness: awake and alert and oriented Pain management: pain level controlled Vital Signs Assessment: post-procedure vital signs reviewed and stable Respiratory status: spontaneous breathing Cardiovascular status: blood pressure returned to baseline Anesthetic complications: no    Last Vitals:  Filed Vitals:   04/10/15 1720 04/10/15 1753  BP: 119/75 120/66  Pulse: 95 92  Temp: 36.4 C 36.8 C  Resp: 18 18    Last Pain:  Filed Vitals:   04/10/15 1753  PainSc: 0-No pain                 Tanganyika Bowlds

## 2015-04-10 NOTE — Progress Notes (Signed)
[facility] at [place] NAME: [name]    MR#:  [id number]  DATE OF BIRTH:  1955/03/01  SUBJECTIVE:   No acute events overnight. Patient is scheduled for thorascopic talc pleurodesis with pleural biopsy today  REVIEW OF SYSTEMS:    Review of Systems  Constitutional: Negative for fever, chills and malaise/fatigue.  HENT: Negative for ear discharge, ear pain, hearing loss, nosebleeds and sore throat.   Eyes: Negative for blurred vision and pain.  Respiratory: Negative for cough, hemoptysis, sputum production, shortness of breath and wheezing.   Cardiovascular: Negative for chest pain, palpitations and leg swelling.  Gastrointestinal: Negative for nausea, vomiting, abdominal pain, diarrhea and blood in stool.  Genitourinary: Negative for dysuria.  Musculoskeletal: Negative for back pain.  Neurological: Negative for dizziness, tremors, speech change, focal weakness, seizures and headaches.  Endo/Heme/Allergies: Does not bruise/bleed easily.  Psychiatric/Behavioral: Negative for depression, suicidal ideas and hallucinations.    Tolerating Diet: Currently nothing by mouth      DRUG ALLERGIES:   Allergies  Allergen Reactions  . Fish Allergy Anaphylaxis    Patient allergic to perch only. She can eat other fish.  . Sulfa Antibiotics Hives    VITALS:  Blood pressure 119/64, pulse 94, temperature 98.6 F (37 C), temperature source Oral, resp. rate 16, height '5\' 5"'$  (1.651 m), weight 93.668 kg (206 lb 8 oz), SpO2 93 %.  PHYSICAL EXAMINATION:   Physical Exam  Constitutional: She is oriented to person, place, and time and well-developed, well-nourished, and in no distress. No distress.  HENT:  Head: Normocephalic.  Eyes: No scleral icterus.  Neck: Normal range of motion. Neck supple. No JVD present. No tracheal deviation present.  Cardiovascular: Normal rate, regular rhythm and normal heart sounds.  Exam reveals no gallop  and no friction rub.   No murmur heard. Pulmonary/Chest: Effort normal and breath sounds normal. No respiratory distress. She has no wheezes. She has no rales. She exhibits no tenderness.  Decreased breath sounds right base  Abdominal: Soft. Bowel sounds are normal. She exhibits no distension and no mass. There is no tenderness. There is no rebound and no guarding.  Colostomy without stool  Musculoskeletal: Normal range of motion. She exhibits no edema.  Neurological: She is alert and oriented to person, place, and time.  Skin: Skin is warm. No rash noted. No erythema.  Psychiatric: Affect and judgment normal.      LABORATORY PANEL:   CBC  Recent Labs Lab 04/10/15 0508  WBC 9.1  HGB 8.2*  HCT 25.0*  PLT 234   ------------------------------------------------------------------------------------------------------------------  Chemistries   Recent Labs Lab 04/09/15 0502 04/10/15 0508  NA 136 135  K 3.1* 4.6  CL 104 105  CO2 30 27  GLUCOSE 136* 101*  BUN 9 10  CREATININE 0.85 0.86  CALCIUM 7.9* 7.9*  MG 1.9  --   AST 30  --   ALT 12*  --   ALKPHOS 115  --   BILITOT <0.1*  --    ------------------------------------------------------------------------------------------------------------------  Cardiac Enzymes  Recent Labs Lab 04/08/15 0929  TROPONINI <0.03   ------------------------------------------------------------------------------------------------------------------  RADIOLOGY:  No results found.   ASSESSMENT AND PLAN:    60 y.o. female has a past medical history significant for COPD and bipolar D/O recently admitted for COPD exacerbation now with progressive SOB and enlarging right pleural effusion.  1. 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 Plan for thorascopic talc pleurodesis with pleural biopsy by Dr. Genevive Bi. No need for  antibiotics of this will be discontinue. Pulmonary consult as per the request of patient later this afternoon..   2. COPD: No signs of exacerbation Continue inhalers, nebulizer   3. Bipolar operative disorder: Continue Abilify clonazepam, Cymbalta and Lexapro   4. Tobacco dependence: . Continue nicotine patch and encouragement to remain abstinent.   5. History of anal cancer: Patient currently has a colostomy. There has been discussion with her surgeon for reversal in the future.  gement plans discussed with the patient and she is in agreement.  CODE STATUS: FULL  TOTAL TIME TAKING CARE OF THIS PATIENT: 25 minutes.     POSSIBLE D/C 2-3 days, DEPENDING ON CLINICAL CONDITION.   Jayshun Galentine M.D on 04/10/2015 at 10:25 AM  Between 7am to 6pm - Pager - [phone] After 6pm go to [email or web address] - password [email or web address] [facility]  Office  [phone]  CC: Primary care physician; [name], NP  Note: This dictation was prepared with Dragon dictation along with smaller phrase technology. Any transcriptional errors that result from this process are unintentional.

## 2015-04-10 NOTE — OR Nursing (Signed)
Patient arrived in [facility] without any oxygen on as she stated that she felt good.  Breathing is difficult if she is trying to take a deep breath.  O2 sats without 02 was 82%..  2L NP reinstated with current o2 sats at 97%.

## 2015-04-10 NOTE — Transfer of Care (Signed)
Immediate Anesthesia Transfer of Care Note  Patient: [name]  Procedure(s) Performed: Procedure(s): PRE-OP BRONCHOSCOPY, VIDEO ASSISTED THORACOSCOPY RIGHT WITH PLEURAL BIOPSIES, & PLEURAL DESIS (Right)  Patient Location: PACU  Anesthesia Type:General  Level of Consciousness: awake  Airway & Oxygen Therapy: Patient Spontanous Breathing and Patient connected to face mask oxygen  Post-op Assessment: Report given to RN and Post -op Vital signs reviewed and stable  Post vital signs: stable  Last Vitals:  Filed Vitals:   04/10/15 1235 04/10/15 1620  BP: 130/68 112/50  Pulse: 107 96  Temp: 36.9 C 36.8 C  Resp:  21    Complications: No apparent anesthesia complications

## 2015-04-11 ENCOUNTER — Encounter: Payer: Self-pay | Admitting: Cardiothoracic Surgery

## 2015-04-11 DIAGNOSIS — J449 Chronic obstructive pulmonary disease, unspecified: Secondary | ICD-10-CM

## 2015-04-11 DIAGNOSIS — J90 Pleural effusion, not elsewhere classified: Secondary | ICD-10-CM

## 2015-04-11 MED ORDER — SODIUM CHLORIDE 0.9% FLUSH: 10.0000 mL | INTRAVENOUS | Status: DC | PRN

## 2015-04-11 MED ORDER — IPRATROPIUM-ALBUTEROL 0.5-2.5 (3) MG/3ML IN SOLN
3.0000 mL | RESPIRATORY_TRACT | Status: DC | PRN
  Administered 2015-04-12: 3 mL via RESPIRATORY_TRACT
  Filled 2015-04-11: qty 3

## 2015-04-11 MED ORDER — SODIUM CHLORIDE 0.9% FLUSH
10.0000 mL | Freq: Two times a day (BID) | INTRAVENOUS | Status: DC
  Administered 2015-04-11 – 2015-04-12 (×3): 10 mL
  Administered 2015-04-13: 11:00:00 20 mL
  Administered 2015-04-14 – 2015-04-17 (×4): 10 mL

## 2015-04-11 MED ORDER — LACTULOSE 10 GM/15ML PO SOLN
30.0000 g | Freq: Every day | ORAL | Status: DC
  Administered 2015-04-11 – 2015-04-14 (×3): 30 g via ORAL
  Filled 2015-04-11 (×4): qty 60

## 2015-04-11 NOTE — Progress Notes (Signed)
[facility] at [place] NAME: [name]    MR#:  [id number]  DATE OF BIRTH:  Dec 28, 1955  SUBJECTIVE:   My shortness of breath is improved. I'm having pain from the chest 2.  REVIEW OF SYSTEMS:    Review of Systems  Constitutional: Negative for fever, chills and malaise/fatigue.  HENT: Negative for ear discharge, ear pain, hearing loss, nosebleeds and sore throat.   Eyes: Negative for blurred vision and pain.  Respiratory: Negative for cough, hemoptysis, sputum production, shortness of breath and wheezing.   Cardiovascular: Negative for chest pain, palpitations and leg swelling.  Gastrointestinal: Positive for constipation. Negative for nausea, vomiting, abdominal pain, diarrhea and blood in stool.  Genitourinary: Negative for dysuria.  Musculoskeletal: Negative for back pain.  Skin: Negative for rash.  Neurological: Negative for dizziness, tremors, speech change, focal weakness, seizures and headaches.  Endo/Heme/Allergies: Does not bruise/bleed easily.  Psychiatric/Behavioral: Negative for depression, suicidal ideas and hallucinations. The patient is not nervous/anxious.     Tolerating Diet: Yes      DRUG ALLERGIES:   Allergies  Allergen Reactions  . Fish Allergy Anaphylaxis    Patient allergic to perch only. She can eat other fish.  . Sulfa Antibiotics Hives    VITALS:  Blood pressure 112/63, pulse 86, temperature 98 F (36.7 C), temperature source Oral, resp. rate 18, height 5' 6.5" (1.689 m), weight 93.532 kg (206 lb 3.2 oz), SpO2 94 %.  PHYSICAL EXAMINATION:   Physical Exam  Constitutional: She is oriented to person, place, and time and well-developed, well-nourished, and in no distress. No distress.  HENT:  Head: Normocephalic.  Eyes: No scleral icterus.  Neck: Normal range of motion. Neck supple. No JVD present. No tracheal deviation present.  Cardiovascular: Normal rate, regular rhythm and normal heart  sounds.  Exam reveals no gallop and no friction rub.   No murmur heard. Pulmonary/Chest: Effort normal and breath sounds normal. No respiratory distress. She has no wheezes. She has no rales. She exhibits no tenderness.  Chest tube placed  Abdominal: Soft. Bowel sounds are normal. She exhibits no distension and no mass. There is no tenderness. There is no rebound and no guarding.  Colostomy without stool  Musculoskeletal: Normal range of motion. She exhibits no edema.  Neurological: She is alert and oriented to person, place, and time.  Skin: Skin is warm. No rash noted. No erythema.  Psychiatric: Affect and judgment normal.      LABORATORY PANEL:   CBC  Recent Labs Lab 04/10/15 0508  WBC 9.1  HGB 8.2*  HCT 25.0*  PLT 234   ------------------------------------------------------------------------------------------------------------------  Chemistries   Recent Labs Lab 04/09/15 0502 04/10/15 0508  NA 136 135  K 3.1* 4.6  CL 104 105  CO2 30 27  GLUCOSE 136* 101*  BUN 9 10  CREATININE 0.85 0.86  CALCIUM 7.9* 7.9*  MG 1.9  --   AST 30  --   ALT 12*  --   ALKPHOS 115  --   BILITOT <0.1*  --    ------------------------------------------------------------------------------------------------------------------  Cardiac Enzymes  Recent Labs Lab 04/08/15 0929  TROPONINI <0.03   ------------------------------------------------------------------------------------------------------------------  RADIOLOGY:  Dg Chest 1 View  04/10/2015  CLINICAL DATA:  Post pop RIGHT side drain insertion for pleural effusion EXAM: CHEST 1 VIEW COMPARISON:  Portable exam 1655 hours compared to 04/08/2015 FINDINGS: New RIGHT pleural catheter. Significantly decreased RIGHT pleural effusion since previous exam with persistent atelectasis and effusion at RIGHT base.  Normal heart size, mediastinal contours, and pulmonary vascularity. LEFT lung clear. No pneumothorax. Bones demineralized.  IMPRESSION: Significant decrease in RIGHT pleural effusion and atelectasis post pleural drainage catheter placement. Electronically Signed   By: Lavonia Dana M.D.   On: 04/10/2015 17:03     ASSESSMENT AND PLAN:    60 y.o. female has a past medical history significant for COPD and bipolar D/O recently admitted for COPD exacerbation now with progressive SOB and enlarging right pleural effusion.  1. 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 She is POD #1  for thorascopic talc pleurodesis with pleural biopsy and chest tube placement by Dr. Genevive Bi .  Continue plan as per Dr Genevive Bi and will need to follow up with biopsy results.  2. COPD: No signs of exacerbation Continue inhalers, nebulizer   3. Bipolar disorder: Continue Abilify clonazepam, Cymbalta and Lexapro   4. Tobacco dependence: . Continue nicotine patch and encouragement to remain abstinent.   5. History of anal cancer: Patient currently has a colostomy. There has been discussion with her surgeon for reversal in the future. She has constipation. She is on MiraLAX and Colace. I will add lactulose.  Management plans discussed with the patient and she is in agreement.  CODE STATUS: FULL  TOTAL TIME TAKING CARE OF THIS PATIENT: 26 minutes.     POSSIBLE D/C 2-3 days, DEPENDING ON CLINICAL CONDITION.   [name] M.D on 04/11/2015 at 11:13 AM  Between 7am to 6pm - Pager - [phone] After 6pm go to [email or web address] - password [email or web address] [facility]  Office  [phone]  CC: Primary care physician; [name], NP  Note: This dictation was prepared with Dragon dictation along with smaller phrase technology. Any transcriptional errors that result from this process are unintentional.

## 2015-04-11 NOTE — Progress Notes (Signed)
Peripherally Inserted Central Catheter/Midline Placement  The IV Nurse has discussed with the patient and/or persons authorized to consent for the patient, the purpose of this procedure and the potential benefits and risks involved with this procedure.  The benefits include less needle sticks, lab draws from the catheter and patient may be discharged home with the catheter.  Risks include, but not limited to, infection, bleeding, blood clot (thrombus formation), and puncture of an artery; nerve damage and irregular heat beat.  Alternatives to this procedure were also discussed.  PICC/Midline Placement Documentation  PICC Single Lumen 04/11/15 PICC Right Cephalic 37 cm 0 cm (Active)  Indication for Insertion or Continuance of Line Poor Vasculature-patient has had multiple peripheral attempts or PIVs lasting less than 24 hours 04/11/2015  8:00 AM  Exposed Catheter (cm) 0 cm 04/11/2015  8:00 AM  Dressing Change Due 04/18/15 04/11/2015  8:00 AM       Jule Economy Horton 04/11/2015, 11:42 AM

## 2015-04-11 NOTE — Progress Notes (Signed)
Resting comfortably  Filed Vitals:   04/11/15 0544 04/11/15 0900 04/11/15 1347 04/11/15 1353  BP: 116/68 112/63 137/65   Pulse: 80 86 102 96  Temp: 98 F (36.7 C) 98 F (36.7 C) 98.7 F (37.1 C)   TempSrc: Oral Oral Oral   Resp: '20 18 24 21  '$ Height:      Weight:      SpO2: 98% 94% 82%    NAD HEENT WNL Reg, no M No wheezes NABS, soft Ext warm  BMP Latest Ref Rng 04/10/2015 04/09/2015 04/08/2015  Glucose 65 - 99 mg/dL 101(H) 136(H) 116(H)  BUN 6 - 20 mg/dL '10 9 8  '$ Creatinine 0.44 - 1.00 mg/dL 0.86 0.85 0.76  Sodium 135 - 145 mmol/L 135 136 133(L)  Potassium 3.5 - 5.1 mmol/L 4.6 3.1(L) 3.0(L)  Chloride 101 - 111 mmol/L 105 104 100(L)  CO2 22 - 32 mmol/L '27 30 27  '$ Calcium 8.9 - 10.3 mg/dL 7.9(L) 7.9(L) 7.7(L)    CBC Latest Ref Rng 04/10/2015 04/09/2015 04/08/2015  WBC 3.6 - 11.0 K/uL 9.1 6.7 7.3  Hemoglobin 12.0 - 16.0 g/dL 8.2(L) 8.0(L) 9.0(L)  Hematocrit 35.0 - 47.0 % 25.0(L) 24.3(L) 27.1(L)  Platelets 150 - 440 K/uL 234 182 246    No new CXR  Surgical path pending  IMPRESSION: COPD Persistent R pleural effusion - concern for malignancy S/P VATS, talc pleurodesis  PLAN/REC: Continue Symbicort, Spiriva Change nebulized bronchodilators to PRN Further mgmt of chest tube per thoracic surgery Further mgmt of likely malignancy per Oncology  PCCM will sign off. Please call if we can be of further assistance  Merton Border, MD PCCM service Mobile [phone] Pager [phone] 04/11/2015

## 2015-04-11 NOTE — Progress Notes (Signed)
MD Pyreddie notified for unable to insert an IV to pt. MD stated that he will place in an order a PICC line.

## 2015-04-11 NOTE — Progress Notes (Signed)
Dear Doctor: This patient has been identified as a candidate for PICC for the following reason (s): restarts due to phlebitis and infiltration in 24 hours If you agree, please write an order for the indicated device. For any questions contact the Vascular Access Team at [facility] or at pager [phone] if no answer, please leave a message.  Thank you for supporting the early vascular access assessment program.

## 2015-04-11 NOTE — Progress Notes (Signed)
Complains of some pain at chest tube site.    Chest tube drained about 200 cc overnight.  Redressed all wounds.  Thoracoscopy sites are clean and dry. Some serous drainage around chest tube site  Will check CXRay tomorrow Will allow patient to be placed to water seal during ambulation Check on Path tomorrow - Dr. Mike Gip aware that patient is in hospital  [employer].

## 2015-04-12 ENCOUNTER — Inpatient Hospital Stay: Payer: Commercial Managed Care - HMO

## 2015-04-12 ENCOUNTER — Inpatient Hospital Stay: Admit: 2015-04-12 | Payer: Commercial Managed Care - HMO

## 2015-04-12 LAB — SURGICAL PATHOLOGY

## 2015-04-12 LAB — POTASSIUM: POTASSIUM: 4 mmol/L (ref 3.5–5.1)

## 2015-04-12 MED ORDER — CYCLOBENZAPRINE HCL 10 MG PO TABS: 5.0000 mg | ORAL_TABLET | Freq: Three times a day (TID) | ORAL | Status: DC | PRN

## 2015-04-12 NOTE — Progress Notes (Signed)
[facility] Post-Op Note  Patient ID: [name], female   DOB: 1955-05-19, 60 y.o.   MRN: [id number]  HISTORY: Her only complaint today is that she has some neck pain. She denied any fever. She states that her breathing is improved.   Filed Vitals:   04/11/15 2200 04/12/15 0459  BP: 130/65 133/68  Pulse: 93 92  Temp: 98.2 F (36.8 C) 98 F (36.7 C)  Resp: 20 18     EXAM: Resp: Lungs are clear bilaterally but decreased at both bases particularly on the right.  No respiratory distress, normal effort. Heart:  Regular without murmurs Skin: Skin is warm and dry. No rash noted. No diaphoretic. No erythema. No pallor.  Psychiatric: Normal mood and affect. Normal behavior. Judgment and thought content normal.    ASSESSMENT: Her chest tube has drained about 250 cc over the last 24 hours. There is no air leak. Her chest x-ray today shows some postoperative atelectasis and some hazy consolidation at the right costophrenic angle.   PLAN:   I will remove her chest tube in today and keep it to suction. We will consider waterseal tomorrow. I would encourage the patient to be out of bed and an billet is much as possible.    Nestor Lewandowsky, MD

## 2015-04-12 NOTE — Progress Notes (Signed)
[facility] at [place] NAME: [name]    MR#:  [id number]  DATE OF BIRTH:  1955-05-30  SUBJECTIVE:   Neck pain this morning  REVIEW OF SYSTEMS:    Review of Systems  Constitutional: Negative for fever, chills and malaise/fatigue.  HENT: Negative for ear discharge, ear pain, hearing loss, nosebleeds and sore throat.        Neck pain   Eyes: Negative for blurred vision and pain.  Respiratory: Negative for cough, hemoptysis, sputum production, shortness of breath and wheezing.   Cardiovascular: Negative for chest pain, palpitations and leg swelling.  Gastrointestinal: Negative for nausea, vomiting, abdominal pain, diarrhea, constipation and blood in stool.  Genitourinary: Negative for dysuria.  Musculoskeletal: Negative for back pain.  Skin: Negative for rash.  Neurological: Negative for dizziness, tremors, speech change, focal weakness, seizures and headaches.  Endo/Heme/Allergies: Does not bruise/bleed easily.  Psychiatric/Behavioral: Negative for depression, suicidal ideas and hallucinations. The patient is not nervous/anxious.     Tolerating Diet: Yes      DRUG ALLERGIES:   Allergies  Allergen Reactions  . Fish Allergy Anaphylaxis    Patient allergic to perch only. She can eat other fish.  . Sulfa Antibiotics Hives    VITALS:  Blood pressure 133/68, pulse 92, temperature 98 F (36.7 C), temperature source Oral, resp. rate 18, height 5' 6.5" (1.689 m), weight 98.158 kg (216 lb 6.4 oz), SpO2 94 %.  PHYSICAL EXAMINATION:   Physical Exam  Constitutional: She is oriented to person, place, and time and well-developed, well-nourished, and in no distress. No distress.  HENT:  Head: Normocephalic.  Eyes: No scleral icterus.  Neck: Normal range of motion. Neck supple. No JVD present. No tracheal deviation present.  Cardiovascular: Normal rate, regular rhythm and normal heart sounds.  Exam reveals no gallop and no  friction rub.   No murmur heard. Pulmonary/Chest: Effort normal and breath sounds normal. No respiratory distress. She has no wheezes. She has no rales. She exhibits no tenderness.  Chest tube placed  Abdominal: Soft. Bowel sounds are normal. She exhibits no distension and no mass. There is no tenderness. There is no rebound and no guarding.  Colostomy with some stool  Musculoskeletal: Normal range of motion. She exhibits no edema.  Neurological: She is alert and oriented to person, place, and time.  Skin: Skin is warm. No rash noted. No erythema.  Psychiatric: Affect and judgment normal.      LABORATORY PANEL:   CBC  Recent Labs Lab 04/10/15 0508  WBC 9.1  HGB 8.2*  HCT 25.0*  PLT 234   ------------------------------------------------------------------------------------------------------------------  Chemistries   Recent Labs Lab 04/09/15 0502 04/10/15 0508  NA 136 135  K 3.1* 4.6  CL 104 105  CO2 30 27  GLUCOSE 136* 101*  BUN 9 10  CREATININE 0.85 0.86  CALCIUM 7.9* 7.9*  MG 1.9  --   AST 30  --   ALT 12*  --   ALKPHOS 115  --   BILITOT <0.1*  --    ------------------------------------------------------------------------------------------------------------------  Cardiac Enzymes  Recent Labs Lab 04/08/15 0929  TROPONINI <0.03   ------------------------------------------------------------------------------------------------------------------  RADIOLOGY:  Dg Chest 1 View  04/10/2015  CLINICAL DATA:  Post pop RIGHT side drain insertion for pleural effusion EXAM: CHEST 1 VIEW COMPARISON:  Portable exam 1655 hours compared to 04/08/2015 FINDINGS: New RIGHT pleural catheter. Significantly decreased RIGHT pleural effusion since previous exam with persistent atelectasis and effusion at RIGHT base. Normal  heart size, mediastinal contours, and pulmonary vascularity. LEFT lung clear. No pneumothorax. Bones demineralized. IMPRESSION: Significant decrease in RIGHT  pleural effusion and atelectasis post pleural drainage catheter placement. Electronically Signed   By: Lavonia Dana M.D.   On: 04/10/2015 17:03   Dg Chest 2 View  04/12/2015  CLINICAL DATA:  Right-sided chest tube. Malignant pleural effusion right. EXAM: CHEST  2 VIEW COMPARISON:  04/10/2015 FINDINGS: Right-sided chest tube unchanged. Small right pleural effusion and right lower lobe atelectasis unchanged. Mild left lower lobe atelectasis and small left effusion. Right arm PICC tip in the lower SVC unchanged. No pneumothorax.  Negative for edema. IMPRESSION: Right chest tube with small right pleural effusion unchanged. No pneumothorax. Interval placement of right arm PICC with the tip in the lower SVC. Electronically Signed   By: Franchot Gallo M.D.   On: 04/12/2015 08:22     ASSESSMENT AND PLAN:    60 y.o. female has a past medical history significant for COPD and bipolar D/O recently admitted for COPD exacerbation now with progressive SOB and enlarging right pleural effusion.  1. 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 She is POD #2 for thorascopic talc pleurodesis with pleural biopsy and chest tube placement by Dr. Genevive Bi . Plan to remove chest tube and keep to suction. No signs of air leak. Follow-up with biopsy results. Oncology consulted.   2. COPD: No signs of exacerbation Continue inhalers, nebulizer   3. Bipolar disorder: Continue Abilify clonazepam, Cymbalta and Lexapro   4. Tobacco dependence: . Continue nicotine patch and encouragement to remain abstinent.   5. History of anal cancer: Patient currently has a colostomy. There has been discussion with her surgeon for reversal in the future. She has constipation. She is on MiraLAX and Colace. I will add lactulose.   6. Neck pain: Add Flexeril. Management plans discussed with the patient and she is in agreement.  CODE STATUS: FULL  TOTAL TIME TAKING CARE OF THIS PATIENT: 24  minutes.     POSSIBLE D/C 2-3 days, DEPENDING ON CLINICAL CONDITION.   Kaylise Blakeley M.D on 04/12/2015 at 11:23 AM  Between 7am to 6pm - Pager - [phone] After 6pm go to [email or web address] - password [email or web address] [facility]  Office  [phone]  CC: Primary care physician; [name], NP  Note: This dictation was prepared with Dragon dictation along with smaller phrase technology. Any transcriptional errors that result from this process are unintentional.

## 2015-04-12 NOTE — Progress Notes (Signed)
Was notified this am from the telemetry central that pt has had a couple of runs of SVT's last night at 21:24 pm. A run of 7 beats and a 13 beat. MD notified.

## 2015-04-12 NOTE — Evaluation (Signed)
Physical Therapy Evaluation Patient Details Name: [name] MRN: [id number] DOB: 14-Jun-1955 Today's Date: 04/12/2015   History of Present Illness  Pt is a 60 y.o. female with PMH of cancer, COPD, bipolar disorder, PTSD, UE DVT, and a colostomy (05-26-14).  Pt presented with SOB and right pleural effusion.  Pt was admitted for COPD exacerbation.  Pt is s/p thoracoscopy with chest tube placement (04-10-15)    Clinical Impression  Prior to admission pt was independent.  Pt lives with sister, her sister's husband and her niece.  Pt's sister and her sister's husband work and are unable to be at home for the entire day.  Pt was modified independent with bed mobility, CGA for sit to stand with RW, and CGA for ambulation with RW for 3 feet.  Pt's vitals were O2 saturation: beginning of session: 97% O2, after ambulation: 82% O2, end of session: 93% O2 (all 2 L/min O2 via nasal cannula), HR: 87-95 bpm.  Pt was instructed in diaphragmatic breathing sitting in chair.  Due to aforementioned function and strength deficits, pt is in need of skilled physical therapy.  It is recommended that pt is discharged to SNF pending further assessment concerning O2 needs with household functional mobility when medically appropriate.      Follow Up Recommendations SNF (pending further assessment concerning O2 needs with household functional mobility )    Equipment Recommendations  Rolling walker with 5" wheels    Recommendations for Other Services       Precautions / Restrictions Precautions Precautions: Fall Precaution Comments: R Chest Tube with suction, L lower quadrant colostomy  Restrictions Weight Bearing Restrictions: No      Mobility  Bed Mobility Overal bed mobility: Modified Independent (bed rail )                Transfers Overall transfer level: Needs assistance Equipment used: Rolling walker (2 wheeled) Transfers: Sit to/from Stand Sit to Stand: Min guard         General transfer  comment: increased time   Ambulation/Gait Ambulation/Gait assistance: Min guard Ambulation Distance (Feet): 3 Feet Assistive device: Rolling walker (2 wheeled) Gait Pattern/deviations: Step-to pattern;Decreased step length - left;Decreased step length - right Gait velocity: decreased       Stairs            Wheelchair Mobility    Modified Rankin (Stroke Patients Only)       Balance Overall balance assessment: Needs assistance Sitting-balance support: Feet supported Sitting balance-Leahy Scale: Normal     Standing balance support: Bilateral upper extremity supported (RW ) Standing balance-Leahy Scale: Good                               Pertinent Vitals/Pain Pain Assessment: 0-10 Pain Score: 8  Pain Location: L upper back and neck  Pain Descriptors / Indicators: Aching;Constant;Operative site guarding;Grimacing Pain Intervention(s): Limited activity within patient's tolerance;Monitored during session;Repositioned  See flow sheet for vitals.     Home Living Family/patient expects to be discharged to:: Private residence Living Arrangements: Other relatives Available Help at Discharge: Family   Home Access: Level entry     Home Layout: One level [place]: None      Prior Function Level of Independence: Independent               Hand Dominance        Extremity/Trunk Assessment   Upper Extremity Assessment: Overall WFL for tasks  assessed           Lower Extremity Assessment: Generalized weakness      Cervical / Trunk Assessment: Normal  Communication   Communication: No difficulties  Cognition Arousal/Alertness: Awake/alert Behavior During Therapy: WFL for tasks assessed/performed Overall Cognitive Status: Within Functional Limits for tasks assessed                      General Comments   Nursing was contacted and cleared pt for physical therapy.  Pt was agreeable and session was modified due to O2  desaturation.       Exercises        Assessment/Plan    PT Assessment Patient needs continued PT services  PT Diagnosis Difficulty walking   PT Problem List Decreased strength;Decreased range of motion;Decreased activity tolerance;Decreased mobility;Pain  PT Treatment Interventions DME instruction;Gait training;Functional mobility training;Therapeutic activities;Therapeutic exercise;Patient/family education   PT Goals (Current goals can be found in the Care Plan section) Acute Rehab PT Goals Patient Stated Goal: to go home  PT Goal Formulation: With patient Time For Goal Achievement: 04/26/15 Potential to Achieve Goals: Fair    Frequency Min 2X/week   Barriers to discharge        Co-evaluation               End of Session Equipment Utilized During Treatment: Oxygen (2 L/min via nasal cannula ) Activity Tolerance: Other (comment) (pt limited due to O2 desaturation  ) Patient left: in chair;with chair alarm set;with call bell/phone within reach Nurse Communication: Mobility status;Precautions         Time: 9758-8325 PT Time Calculation (min) (ACUTE ONLY): 29 min   Charges:         PT G Codes:       Mittie Bodo, SPT Mittie Bodo 04/12/2015, 1:12 PM

## 2015-04-12 NOTE — Care Management Important Message (Signed)
Important Message  Patient Details  Name: [name] MRN: [id number] Date of Birth: 09-30-1955   Medicare Important Message Given:  Yes    Juliann Pulse A Reona Zendejas 04/12/2015, 1:46 PM

## 2015-04-13 ENCOUNTER — Inpatient Hospital Stay: Admit: 2015-04-13 | Payer: Commercial Managed Care - HMO

## 2015-04-13 ENCOUNTER — Inpatient Hospital Stay (HOSPITAL_COMMUNITY)
Admit: 2015-04-13 | Discharge: 2015-04-13 | Disposition: A | Discharge status: Home / Self Care | Payer: Commercial Managed Care - HMO | Attending: Internal Medicine | Admitting: Internal Medicine

## 2015-04-13 DIAGNOSIS — C7800 Secondary malignant neoplasm of unspecified lung: Secondary | ICD-10-CM

## 2015-04-13 DIAGNOSIS — R109 Unspecified abdominal pain: Secondary | ICD-10-CM

## 2015-04-13 DIAGNOSIS — J45909 Unspecified asthma, uncomplicated: Secondary | ICD-10-CM

## 2015-04-13 DIAGNOSIS — Z79899 Other long term (current) drug therapy: Secondary | ICD-10-CM

## 2015-04-13 DIAGNOSIS — R197 Diarrhea, unspecified: Secondary | ICD-10-CM

## 2015-04-13 DIAGNOSIS — R251 Tremor, unspecified: Secondary | ICD-10-CM

## 2015-04-13 DIAGNOSIS — F319 Bipolar disorder, unspecified: Secondary | ICD-10-CM

## 2015-04-13 DIAGNOSIS — J90 Pleural effusion, not elsewhere classified: Secondary | ICD-10-CM

## 2015-04-13 DIAGNOSIS — K219 Gastro-esophageal reflux disease without esophagitis: Secondary | ICD-10-CM

## 2015-04-13 DIAGNOSIS — F418 Other specified anxiety disorders: Secondary | ICD-10-CM

## 2015-04-13 DIAGNOSIS — C20 Malignant neoplasm of rectum: Secondary | ICD-10-CM

## 2015-04-13 DIAGNOSIS — E039 Hypothyroidism, unspecified: Secondary | ICD-10-CM

## 2015-04-13 DIAGNOSIS — M797 Fibromyalgia: Secondary | ICD-10-CM

## 2015-04-13 DIAGNOSIS — M5136 Other intervertebral disc degeneration, lumbar region: Secondary | ICD-10-CM

## 2015-04-13 DIAGNOSIS — F209 Schizophrenia, unspecified: Secondary | ICD-10-CM

## 2015-04-13 DIAGNOSIS — Z86718 Personal history of other venous thrombosis and embolism: Secondary | ICD-10-CM

## 2015-04-13 DIAGNOSIS — Z87891 Personal history of nicotine dependence: Secondary | ICD-10-CM

## 2015-04-13 DIAGNOSIS — G629 Polyneuropathy, unspecified: Secondary | ICD-10-CM

## 2015-04-13 DIAGNOSIS — E041 Nontoxic single thyroid nodule: Secondary | ICD-10-CM

## 2015-04-13 DIAGNOSIS — G4700 Insomnia, unspecified: Secondary | ICD-10-CM

## 2015-04-13 DIAGNOSIS — R0602 Shortness of breath: Secondary | ICD-10-CM

## 2015-04-13 DIAGNOSIS — Z931 Gastrostomy status: Secondary | ICD-10-CM

## 2015-04-13 LAB — PREPARE RBC (CROSSMATCH)

## 2015-04-13 LAB — RETICULOCYTES
RBC.: 2.7 MIL/uL — ABNORMAL LOW (ref 3.80–5.20)
RETIC CT PCT: 3.1 % (ref 0.4–3.1)
Retic Count, Absolute: 83.7 10*3/uL (ref 19.0–183.0)

## 2015-04-13 LAB — FERRITIN: FERRITIN: 178 ng/mL (ref 11–307)

## 2015-04-13 LAB — CBC
HCT: 22 % — ABNORMAL LOW (ref 35.0–47.0)
HEMOGLOBIN: 7.1 g/dL — AB (ref 12.0–16.0)
MCH: 28.4 pg (ref 26.0–34.0)
MCHC: 32.2 g/dL (ref 32.0–36.0)
MCV: 88.3 fL (ref 80.0–100.0)
PLATELETS: 205 10*3/uL (ref 150–440)
RBC: 2.49 MIL/uL — AB (ref 3.80–5.20)
RDW: 17.1 % — AB (ref 11.5–14.5)
WBC: 7.3 10*3/uL (ref 3.6–11.0)

## 2015-04-13 LAB — ABO/RH: ABO/RH(D): A POS

## 2015-04-13 LAB — MAGNESIUM: MAGNESIUM: 1.8 mg/dL (ref 1.7–2.4)

## 2015-04-13 LAB — IRON AND TIBC
IRON: 13 ug/dL — AB (ref 28–170)
SATURATION RATIOS: 6 % — AB (ref 10.4–31.8)
TIBC: 219 ug/dL — AB (ref 250–450)
UIBC: 206 ug/dL

## 2015-04-13 LAB — FOLATE: Folate: 11.2 ng/mL (ref >5.9)

## 2015-04-13 LAB — VITAMIN B12: Vitamin B-12: 496 pg/mL (ref 180–914)

## 2015-04-13 MED ORDER — SODIUM CHLORIDE 0.9 % IJ SOLN
INTRAMUSCULAR | Status: AC
  Filled 2015-04-13: qty 10

## 2015-04-13 MED ORDER — POLYETHYLENE GLYCOL 3350 17 G PO PACK: 17.0000 g | PACK | Freq: Every day | ORAL | Status: DC

## 2015-04-13 MED ORDER — SODIUM CHLORIDE 0.9 % IV SOLN
Freq: Once | INTRAVENOUS | Status: AC
  Administered 2015-04-13: 14:00:00 via INTRAVENOUS

## 2015-04-13 MED ORDER — FERROUS SULFATE 325 (65 FE) MG PO TABS
325.0000 mg | ORAL_TABLET | Freq: Every day | ORAL | Status: DC
  Administered 2015-04-14 – 2015-04-16 (×3): 325 mg via ORAL
  Filled 2015-04-13 (×3): qty 1

## 2015-04-13 NOTE — Progress Notes (Signed)
[facility] [place] NAME: Meredyth Hornung    MR#:  [id number]  DATE OF BIRTH:  [birth year]  SUBJECTIVE:   Patient still without much of a bowel movement. Neck pain resolved  REVIEW OF SYSTEMS:    Review of Systems  Constitutional: Negative for fever, chills and malaise/fatigue.  HENT: Negative for ear discharge, ear pain, hearing loss, nosebleeds, sore throat and tinnitus.   Eyes: Negative for blurred vision and pain.  Respiratory: Negative for cough, hemoptysis, sputum production, shortness of breath, wheezing and stridor.   Cardiovascular: Negative for chest pain, palpitations and leg swelling.  Gastrointestinal: Positive for constipation. Negative for nausea, vomiting, abdominal pain, diarrhea and blood in stool.  Genitourinary: Negative for dysuria.  Musculoskeletal: Negative for back pain.  Skin: Negative for rash.  Neurological: Negative for dizziness, tremors, speech change, focal weakness, seizures and headaches.  Endo/Heme/Allergies: Does not bruise/bleed easily.  Psychiatric/Behavioral: Negative for depression, suicidal ideas and hallucinations. The patient is not nervous/anxious.     Tolerating Diet: Yes      DRUG ALLERGIES:   Allergies  Allergen Reactions  . Fish Allergy Anaphylaxis    Patient allergic to perch only. She can eat other fish.  . Sulfa Antibiotics Hives    VITALS:  Blood pressure 135/64, pulse 92, temperature 98 F (36.7 C), temperature source Oral, resp. rate 18, height 5' 6.5" (1.689 m), weight 98.385 kg (216 lb 14.4 oz), SpO2 89 %.  PHYSICAL EXAMINATION:   Physical Exam  Constitutional: She is oriented to person, place, and time and well-developed, well-nourished, and in no distress. No distress.  HENT:  Head: Normocephalic.  Eyes: No scleral icterus.  Neck: Normal range of motion. Neck supple. No JVD present. No tracheal deviation present.  Cardiovascular: Normal rate, regular  rhythm and normal heart sounds.  Exam reveals no gallop and no friction rub.   No murmur heard. Pulmonary/Chest: Effort normal and breath sounds normal. No respiratory distress. She has no wheezes. She has no rales. She exhibits no tenderness.  Chest tube placed  Abdominal: Soft. Bowel sounds are normal. She exhibits no distension and no mass. There is no tenderness. There is no rebound and no guarding.  Colostomy not much stool  Musculoskeletal: Normal range of motion. She exhibits no edema.  Neurological: She is alert and oriented to person, place, and time.  Skin: Skin is warm. No rash noted. No erythema.  Psychiatric: Affect and judgment normal.      LABORATORY PANEL:   CBC  Recent Labs Lab 59/07/17 0500  WBC 7.3  HGB 7.1*  HCT 22.0*  PLT 205   ------------------------------------------------------------------------------------------------------------------  Chemistries   Recent Labs Lab 04/09/15 0502 04/10/15 0508 04/12/15 1132 59/07/17 0500  NA 136 135  --   --   K 3.1* 4.6 4.0  --   CL 104 105  --   --   CO2 30 27  --   --   GLUCOSE 136* 101*  --   --   BUN 9 10  --   --   CREATININE 0.85 0.86  --   --   CALCIUM 7.9* 7.9*  --   --   MG 1.9  --   --  1.8  AST 30  --   --   --   ALT 12*  --   --   --   ALKPHOS 115  --   --   --   BILITOT <0.1*  --   --   --    ------------------------------------------------------------------------------------------------------------------  Cardiac Enzymes  Recent Labs Lab 04/08/15 0929  TROPONINI <0.03   ------------------------------------------------------------------------------------------------------------------  RADIOLOGY:  Dg Chest 2 View  04/12/2015  CLINICAL DATA:  Right-sided chest tube. Malignant pleural effusion right. EXAM: CHEST  2 VIEW COMPARISON:  04/10/2015 FINDINGS: Right-sided chest tube unchanged. Small right pleural effusion and right lower lobe atelectasis unchanged. Mild left lower lobe  atelectasis and small left effusion. Right arm PICC tip in the lower SVC unchanged. No pneumothorax.  Negative for edema. IMPRESSION: Right chest tube with small right pleural effusion unchanged. No pneumothorax. Interval placement of right arm PICC with the tip in the lower SVC. Electronically Signed   By: Franchot Gallo M.D.   On: 04/12/2015 08:22     ASSESSMENT AND PLAN:    60 y.o. female has a past medical history significant for COPD and bipolar D/O recently admitted for COPD exacerbation now with progressive SOB and enlarging right pleural effusion.  1. 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 She is POD #3 for thorascopic talc pleurodesis with pleural biopsy and chest tube placement by Dr. Genevive Bi . Leave chest tube in place until drainage is less than 200 cc per day. Follow-up with biopsy results. Oncology consulted.   2. COPD: No signs of exacerbation Continue inhalers, nebulizer   3. Bipolar disorder: Continue Abilify clonazepam, Cymbalta and Lexapro   4. Tobacco dependence: . Continue nicotine patch and encouragement to remain abstinent.   5. History of anal cancer: Patient currently has a colostomy. There has been discussion with her surgeon for reversal in the future. She has constipation. She is on MiraLAX, lactulose and Colace.  Consults surgery due to constipation.  6. Neck pain: Resolved   7. Acute on chronic iron deficiency anemia: Patient has consented for blood transfusion. Hemoglobin after transfusion. Anemia panel ordered and it appears patient has a low iron and low iron saturation Start iron supplementation, daily for now and then if tolerated twice a day.   Management plans discussed with the patient and she is in agreement.  CODE STATUS: FULL  TOTAL TIME TAKING CARE OF THIS PATIENT: 25 minutes.   Physical therapy is recommended skilled nursing facility at discharge  POSSIBLE D/C 2-3 days, DEPENDING ON  CLINICAL CONDITION.   Charolette Bultman M.D on 04/13/2015 at 10:31 AM  Between 7am to 6pm - Pager - [phone] After 6pm go to [email or web address] - password [email or web address] [facility]  Office  [phone]  CC: Primary care physician; [name], NP  Note: This dictation was prepared with Dragon dictation along with smaller phrase technology. Any transcriptional errors that result from this process are unintentional.

## 2015-04-13 NOTE — Consult Note (Signed)
[facility]  Date of admission:  04/08/2015  Inpatient day:  04/13/2015  Consulting physician: Dr. Nestor Lewandowsky   Reason for Consultation:  Metastatic anal carcinoma  Chief Complaint: [name] is a 60 y.o. 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  HPI:  The patient was last seen in the medical oncology clinic on 03/12/2015.  At that time, she notes chronic abdominal pain and blood in her ostomy. Abdomen and pelvic CT scan on 03/15/2015 revealed an increase in subcutaneous edema diffusely attributed to third spacing of fluid, and underlying ascites. There was a loculated right basilar pleural effusion with nodular densities along the pleural margin concerning for possible pleural tumor.  There was some thickened loops of jejunum with scattered air-fluid levels possibly from ileus or inflammation.  She was admitted to [facility] from 03/15/2015 - 03/18/2015 with recurrent pneumonia.  She presented with fever and dyspnea.  She was treated with antibiotics and discharged on Augmentin. CXR on 04/08/2015 revealed a stable large right sided pleural effusion with compressive atelectasis.  She was seen in consultation by Dr. Genevive Bi.  On 04/10/2015, she underwent right thoracotomy, with drainage of loculated right pleural effusion, right pleural biopsy, and talc pleurodesis.  Pathology revealed fragments of metastatic squamous cell  Carcinoma.  Symptomatically, she notes some right sided chest discomfort associated with the tube.  She was initially constipated and now has significant diarrhea after Miralax and Senna.  Past Medical History  Diagnosis Date  . Schizophrenia ([place])   . Asthma   . GERD (gastroesophageal reflux disease)   . Anxiety   . Depression   . Bipolar disorder ([place])   . COPD (chronic obstructive pulmonary disease) ([place])   . Occasional tremors     right hand  . PTSD  (post-traumatic stress disorder)   . Shortness of breath dyspnea   . Fibromyalgia   . DVT (deep venous thrombosis) ([place]) 2011    RUE  . Thyroid nodule   . DDD (degenerative disc disease), lumbar   . Spinal stenosis   . Peripheral neuropathy ([place])   . Rotator cuff tear     right  . Pneumonia 2011  . Hypothyroidism     no meds currently  . Anemia     during pregnancy only  . Hypertension     Off meds x 15 years-well controlled now per pt  . Squamous cell cancer, anus (H[facility])   . Severe obstructive sleep apnea 06/27/2014  . DVT of upper extremity (deep vein thrombosis) ([place]) 10/14/2014    Past Surgical History  Procedure Laterality Date  . Foot surgery Right   . Tubal ligation    . Eye surgery Bilateral   . Mouth surgery  2002  . Rectal biopsy N/A 05/08/2014    Procedure: BIOPSY RECTAL;  Surgeon: Marlyce Huge, MD;  Location: [facility] ORS;  Service: General;  Laterality: N/A;  . Evaluation under anesthesia with hemorrhoidectomy N/A 05/08/2014    Procedure: EXAM UNDER ANESTHESIA WITH HEMORRHOIDECTOMY;  Surgeon: Marlyce Huge, MD;  Location: [facility] ORS;  Service: General;  Laterality: N/A;  . Portacath placement N/A 05/20/2014    Procedure: INSERTION PORT-A-CATH;  Surgeon: Florene Glen, MD;  Location: [facility] ORS;  Service: General;  Laterality: N/A;  . Laparoscopic diverted colostomy N/A 05/26/2014    Procedure: LAPAROSCOPIC DIVERTED COLOSTOMY;  Surgeon: Marlyce Huge, MD;  Location: [facility] ORS;  Service: General;  Laterality: N/A;  .  Peripheral vascular catheterization Left 10/16/2014    Procedure: Upper Extremity Venography with thrombectomy, port removal;  Surgeon: Algernon Huxley, MD;  Location: [place] [facility];  Service: Cardiovascular;  Laterality: Left;  . Peripheral vascular catheterization  10/16/2014    Procedure: Upper Extremity Intervention;  Surgeon: Algernon Huxley, MD;  Location: [place] [facility];  Service: Cardiovascular;;  . Video assisted thoracoscopy  (vats)/thorocotomy Right 04/10/2015    Procedure: PRE-OP BRONCHOSCOPY, VIDEO ASSISTED THORACOSCOPY RIGHT WITH PLEURAL BIOPSIES, & PLEURAL DESIS;  Surgeon: Nestor Lewandowsky, MD;  Location: [facility] ORS;  Service: Thoracic;  Laterality: Right;    Family History  Problem Relation Age of Onset  . Cancer Maternal Aunt   . Cancer Paternal Uncle   . Diabetes Mother   . Thyroid disease Mother   . Kidney failure Mother   . Hypertension Mother   . Depression Mother   . Mental illness Son   . Aneurysm Maternal Grandmother   . Thyroid disease Maternal Grandmother   . Stroke Maternal Grandfather   . Hypertension Maternal Grandfather   . Diabetes Maternal Grandfather   . Heart disease Maternal Grandfather     MI  . Nephrolithiasis Daughter     Social History:  reports that she has quit smoking. Her smoking use included Cigarettes. She started smoking about 44 years ago. She has a 4.4 pack-year smoking history. She has never used smokeless tobacco. She reports that she does not drink alcohol or use illicit drugs.  The patient is alone today.  Allergies:  Allergies  Allergen Reactions  . Fish Allergy Anaphylaxis    Patient allergic to perch only. She can eat other fish.  . Sulfa Antibiotics Hives    Medications Prior to Admission  Medication Sig Dispense Refill  . albuterol (PROVENTIL HFA;VENTOLIN HFA) 108 (90 BASE) MCG/ACT inhaler Inhale 2 puffs into the lungs every 6 (six) hours as needed for wheezing or shortness of breath.     Marland Kitchen albuterol (PROVENTIL) (2.5 MG/3ML) 0.083% nebulizer solution Take 3 mLs (2.5 mg total) by nebulization every 4 (four) hours. (Patient taking differently: Take 2.5 mg by nebulization every 4 (four) hours as needed for wheezing or shortness of breath. ) 360 vial 12  . ARIPiprazole (ABILIFY) 2 MG tablet Take 1 tablet (2 mg total) by mouth daily. 30 tablet 4  . baclofen (LIORESAL) 10 MG tablet Take 1 tablet (10 mg total) by mouth 3 (three) times daily. (Patient taking  differently: Take 10 mg by mouth 3 (three) times daily as needed for muscle spasms. ) 90 each 6  . budesonide-formoterol (SYMBICORT) 160-4.5 MCG/ACT inhaler Inhale 2 puffs into the lungs 2 (two) times daily. 1 Inhaler 12  . cholecalciferol (VITAMIN D) 1000 units tablet Take 1,000 Units by mouth daily.     . clonazePAM (KLONOPIN) 1 MG tablet Take 1 tablet (1 mg total) by mouth 3 (three) times daily as needed for anxiety. 90 tablet 4  . DULoxetine (CYMBALTA) 60 MG capsule Take 60 mg by mouth every morning.     . escitalopram (LEXAPRO) 20 MG tablet Take 1 tablet (20 mg total) by mouth daily. 30 tablet 4  . folic acid (FOLVITE) 1 MG tablet Take 1 tablet (1 mg total) by mouth daily. 30 tablet 3  . nicotine (NICODERM CQ - DOSED IN MG/24 HOURS) 21 mg/24hr patch Place 1 patch (21 mg total) onto the skin daily. 28 patch 0  . nystatin (MYCOSTATIN) 100000 UNIT/ML suspension Take 5 mLs (500,000 Units total) by mouth  4 (four) times daily. 60 mL 2  . oxyCODONE-acetaminophen (PERCOCET) 10-325 MG tablet Take 1 tablet by mouth every 8 (eight) hours as needed for pain. 60 tablet 0  . pantoprazole (PROTONIX) 40 MG tablet Take 40 mg by mouth 2 (two) times daily.     . polyethylene glycol (MIRALAX / GLYCOLAX) packet Take 17 g by mouth daily. 14 each 0  . senna-docusate (SENOKOT-S) 8.6-50 MG tablet Take 1 tablet by mouth at bedtime as needed for mild constipation. 30 tablet 5  . tiotropium (SPIRIVA) 18 MCG inhalation capsule Place 1 capsule (18 mcg total) into inhaler and inhale daily. 30 capsule 12  . traZODone (DESYREL) 50 MG tablet Take 1 tablet (50 mg total) by mouth at bedtime as needed for sleep. 90 tablet 1  . zolpidem (AMBIEN) 10 MG tablet Take 1 tablet (10 mg total) by mouth at bedtime as needed for sleep. 30 tablet 4  . pregabalin (LYRICA) 200 MG capsule Take 1 capsule (200 mg total) by mouth 2 (two) times daily as needed. 90 capsule 6    Review of Systems: GENERAL: Feels tired. No fevers or sweats.  Weight down 7 pounds. PERFORMANCE STATUS (ECOG): 2 HEENT: No visual changes, runny nose, mouth sores or sore throat. Lungs:  Chest discomfort associated with chest tube.  Shortness of breath.  Cough. No hemoptysis. Cardiac: No chest pain, palpitations, orthopnea, or PND. GI: Colostomy. Diarrhea today after 2 days without stool.  No nausea, vomiting, constipation, or melena. GU: No urgency, frequency, dysuria, or hematuria. Musculoskeletal: Back pain with 2 fractures (old). No joint pain. No muscle tenderness. Extremities: Lower extremity swelling, improved. Skin: Peri-rectal breakdown resolved. Neuro: No headache, numbness or weakness, balance or coordination issues. Endocrine: No diabetes, thyroid issues, hot flashes or night sweats. Psych: No mood changes or depression.  Pain: Chronic anal pain. Using oxycodone prn. Awaiting pain clinic appointment. Review of systems: All other systems reviewed and found to be negative.  Physical Exam:  Blood pressure 112/49, pulse 81, temperature 98.7 F (37.1 C), temperature source Oral, resp. rate 18, height 5' 6.5" (1.689 m), weight 216 lb 14.4 oz (98.385 kg), SpO2 100 %.  GENERAL: Chronically ill appearing woman sitting comfortably on the medical unit in no acute distress.  MENTAL STATUS: Alert and oriented to person, place and time. HEAD: Long graying hair. Normocephalic, atraumatic, face symmetric, no Cushingoid features. EYES: Blue eyes. Pupils equal round and reactive to light and accomodation. No conjunctivitis or scleral icterus. RESPIRATORY: Decreased respiratory excursion.  Decreased breath sounds RLL.  Clear to auscultation without rales, wheezes or rhonchi.  Right sided chest tube in place. CARDIOVASCULAR: Regular rate and rhythm without murmur, rub or gallop. ABDOMEN: Ostomy. Stool liquid brown (no blood). Soft, nontender without guarding or rebound tenderness. Active bowel sounds and no  hepatosplenomegaly. No masses. LYMPH NODES: No palpable cervical, supraclavicular, axillary or inguinal adenopathy. SKIN: Upper extremity bruises. No other rashes, ulcers or lesions. EXTREMITIES: Bilateral 1+ lower extremity edema, improved. No skin discoloration or tenderness. No palpable cords. NEUROLOGICAL: Unremarkable. PSYCH: Appropriate.    Results for orders placed or performed during the hospital encounter of 04/08/15 (from the past 48 hour(s))  Potassium     Status: None   Collection Time: 04/12/15 11:32 AM  Result Value Ref Range   Potassium 4.0 3.5 - 5.1 mmol/L  Magnesium     Status: None   Collection Time: 04/13/15  5:00 AM  Result Value Ref Range   Magnesium 1.8 1.7 - 2.4 mg/dL  CBC  Status: Abnormal   Collection Time: 04/13/15  5:00 AM  Result Value Ref Range   WBC 7.3 3.6 - 11.0 K/uL   RBC 2.49 (L) 3.80 - 5.20 MIL/uL   Hemoglobin 7.1 (L) 12.0 - 16.0 g/dL   HCT 22.0 (L) 35.0 - 47.0 %   MCV 88.3 80.0 - 100.0 fL   MCH 28.4 26.0 - 34.0 pg   MCHC 32.2 32.0 - 36.0 g/dL   RDW 17.1 (H) 11.5 - 14.5 %   Platelets 205 150 - 440 K/uL  Vitamin B12     Status: None   Collection Time: 04/13/15  9:02 AM  Result Value Ref Range   Vitamin B-12 496 180 - 914 pg/mL    Comment: (NOTE) This assay is not validated for testing neonatal or myeloproliferative syndrome specimens for Vitamin B12 levels. Performed at [facility]   Folate     Status: None   Collection Time: 04/13/15  9:02 AM  Result Value Ref Range   Folate 11.2 >5.9 ng/mL  Iron and TIBC     Status: Abnormal   Collection Time: 04/13/15  9:02 AM  Result Value Ref Range   Iron 13 (L) 28 - 170 ug/dL   TIBC 219 (L) 250 - 450 ug/dL   Saturation Ratios 6 (L) 10.4 - 31.8 %   UIBC 206 ug/dL  Ferritin     Status: None   Collection Time: 04/13/15  9:02 AM  Result Value Ref Range   Ferritin 178 11 - 307 ng/mL  Reticulocytes     Status: Abnormal   Collection Time: 04/13/15  9:02 AM  Result Value Ref  Range   Retic Ct Pct 3.1 0.4 - 3.1 %   RBC. 2.70 (L) 3.80 - 5.20 MIL/uL   Retic Count, Manual 83.7 19.0 - 183.0 K/uL  Type and screen [facility]     Status: None (Preliminary result)   Collection Time: 04/13/15 11:00 AM  Result Value Ref Range   ABO/RH(D) A POS    Antibody Screen NEG    Sample Expiration 04/16/2015    Unit Number [id number]    Blood Component Type RED CELLS,LR    Unit division 00    Status of Unit ISSUED    Transfusion Status OK TO TRANSFUSE    Crossmatch Result Compatible   Prepare RBC     Status: None   Collection Time: 04/13/15 11:00 AM  Result Value Ref Range   Order Confirmation ORDER PROCESSED BY BLOOD BANK   ABO/Rh     Status: None   Collection Time: 04/13/15 11:01 AM  Result Value Ref Range   ABO/RH(D) A POS    *Note: Due to a large number of results and/or encounters for the requested time period, some results have not been displayed. A complete set of results can be found in Results Review.   Dg Chest 2 View  04/12/2015  CLINICAL DATA:  Right-sided chest tube. Malignant pleural effusion right. EXAM: CHEST  2 VIEW COMPARISON:  04/10/2015 FINDINGS: Right-sided chest tube unchanged. Small right pleural effusion and right lower lobe atelectasis unchanged. Mild left lower lobe atelectasis and small left effusion. Right arm PICC tip in the lower SVC unchanged. No pneumothorax.  Negative for edema. IMPRESSION: Right chest tube with small right pleural effusion unchanged. No pneumothorax. Interval placement of right arm PICC with the tip in the lower SVC. Electronically Signed   By: Franchot Gallo M.D.   On: 04/12/2015 08:22    Assessment:  The patient is a 60 y.o.  woman with a history of stage II moderately differentiated squauous cell carcinoma of the anus s/p concurrent chemotherapy (5FU and mitomycin-C) and radiation (06/29/2014 - 07/31/2014). She completed radiation on 08/23/2014.  She was admitted to [facility] from 02/26/2015 -  03/03/2015 with a COPD exacerbation, RLL pneumonia, and small bowel obstruction. She was treated with antibiotics and steroids. Her small bowel obstruction resolved with conservative management.   She was admitted to [facility] from 03/05/2015 - 03/07/2015 with health care associated pneumonia. She was treated with Zosyn and Levaquin and discharged on Levaquin and prednisone. She was discharged on oxygen 2 liters/min vi nasal cannula to maintain sats > 90%.   She was admitted to [facility] from 03/15/2015 - 03/18/2015 with recurrent pneumonia.  She presented with fever and dyspnea.  She was treated with antibiotics and discharged on Augmentin. CXR on 04/08/2015 revealed a stable large right sided pleural effusion with compressive atelectasis.  She was admitted on 04/08/2015 with progressive shortness of breath and a large right sided pleural effusion.  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  Symptomatically, she has had diarrhea since receiving Miralax for constipation.  Plan:   1.  Review pleural biopsy results.  Discuss biopsy + squamous cell anal cancer.  Discuss metastatic disease.  Discuss palliative chemotherapy with cisplatin and 5-FU.  Discuss need for port-a-cath with continuous infusion 5-FU.  Discuss side effects associated with chemotherapy.  Discuss waiting 1-2 weeks .until fully recovered. 2.  Discuss sending tumor for PDL-1 testing and Foundation One testing. 3.  RTC after discharge from the hospital.  Thank you for allowing me to participate in [name] 's care.  I will follow her closely with you while hospitalized and after discharge in the outpatient department.   Lequita Asal, MD  04/13/2015, 11:39 PM

## 2015-04-13 NOTE — Progress Notes (Signed)
Patient has had several BM today and therefore SURGERY consult d/c. D.w nurse

## 2015-04-13 NOTE — Progress Notes (Signed)
*  PRELIMINARY RESULTS* Echocardiogram 2D Echocardiogram has been performed.  [name] 04/13/2015, 10:09 PM

## 2015-04-13 NOTE — Progress Notes (Signed)
PT Cancellation Note  Patient Details Name: AMINAT SHELBURNE MRN: [id number] DOB: 1956/01/05   Cancelled Treatment:    Reason Eval/Treat Not Completed: Other (comment).  Nursing reporting preparing to start blood transfusion soon (pt's Hgb 7.1 today) and recommending holding PT until after blood transfusion is finished.  Will re-attempt PT treatment at a later date/time.   Raquel Sarna Maurie Musco 04/13/2015, 1:57 PM Leitha Bleak, [place]

## 2015-04-13 NOTE — Progress Notes (Signed)
Nutrition Follow-up  DOCUMENTATION CODES:   Obesity unspecified  INTERVENTION:   -Cater to pt preferences on regular diet order -Mighty Shake at bedside on visit, continue dialy   NUTRITION DIAGNOSIS:   Inadequate oral intake related to acute illness as evidenced by per patient/family report.  GOAL:   Patient will meet greater than or equal to 90% of their needs  MONITOR:   PO intake, Supplement acceptance, Labs, Weight trends, I & O's  REASON FOR ASSESSMENT:   Malnutrition Screening Tool    ASSESSMENT:   Pt admitted with SOB with recurrent pleural effusion per MD note, Dr. Genevive Bi following. Per MD note, pt s/p removal of 6L on 3/16. Pt with h/o anal cancer with colostomy present.  Pt POD3 thorascopic talc pleurodesis with pleural biopsy and chest tube placement .  RD notes oncology consulted.  Diet Order:  Diet regular Room service appropriate?: Yes; Fluid consistency:: Thin    Current Nutrition: Pt reports eating 100% of breakfast this am including french toast, bacon, eggs and fruit cup with coffee and juice. Pt reports appetite doing better this am.  Mighty shake at bedside. Pt somewhat groggy/sleepy on visit. Recorded po intake 50-80% of meals yesterday, 100% of meals prior to surgery.   Gastrointestinal Profile: Last BM:  04/12/2015 via ostomy  Medications: Protonix, Folic acid, Vitamin D, Lactulose, Miralax D5 0.45%NS at 46m/hr (providing 306kcals in 24 hours  Labs: reviewed, WDL   Weight Trend since Admission: Filed Weights   04/11/15 0500 04/12/15 0457 04/13/15 0500  Weight: 206 lb 3.2 oz (93.532 kg) 216 lb 6.4 oz (98.158 kg) 216 lb 14.4 oz (98.385 kg)  RD notes increase in weight, also noted s/p surgery and chest tube placement.   Skin:  Reviewed, no issues   Ideal Body Weight:  56.8 kg  BMI:  Body mass index is 34.49 kg/(m^2).  Estimated Nutritional Needs:   Kcal:  1500-1785kcals  Protein:  45-57g protein  Fluid:  1.4-1.7L  EDUCATION  NEEDS:   No education needs identified at this time  ADwyane Luo RD, LDN Pager [phone]/On-Call Pager ([phone]

## 2015-04-13 NOTE — Progress Notes (Signed)
Feels better this morning.  Not much pain at chest tube site.  Not short of breath  CT drained 250 cc last 24 hours.    No air leak  Chest tube site and thoracoscopy sites are clean and dry  Would leave chest tube in place until drainage is less than 200 cc per day Would ask Dr. Mike Gip in Oncology to see for metastatic anal carcinoma Would recommend physical therapy to evaluate for progressive ambulation  Tim Katrisha Segall.

## 2015-04-13 NOTE — Clinical Social Work Note (Signed)
Clinical Social Work Assessment  Patient Details  Name: [name] MRN: [id number] Date of Birth: 1955/11/10  Date of referral:  04/13/15               Reason for consult:  Facility Placement                Permission sought to share information with:  Family Supports Permission granted to share information::  Yes, Verbal Permission Granted  Name::     Adoptive sister   Housing/Transportation Living arrangements for the past 2 months:  Single Family Home Source of Information:  Patient Patient Interpreter Needed:  None Criminal Activity/Legal Involvement Pertinent to Current Situation/Hospitalization:  No - Comment as needed Significant Relationships:  Other Family Members Lives with:  Adult Children Do you feel safe going back to the place where you live?  No Need for family participation in patient care:  No (Coment)  Care giving concerns:  No care giving concerns idenitified  Facilities manager / plan:  CSW met with pt to address consult. CSW introduced herself and explained role of social work. CSW explained discharging to SNF. Pt declined as she has had poor experiences at facility. PT is recommending SNF, however pt could discharge safely home with home health and home O2. CSW updated RNCM. CSW will continue to follow.   Employment status:  Disabled (Comment on whether or not currently receiving Disability) Insurance information:   Managed Medicare PT Recommendations:  [place] / Referral to community resources:  [place]  Patient/Family's Response to care:  Pt was appreciative of CSW support.   Patient/Family's Understanding of and Emotional Response to Diagnosis, Current Treatment, and Prognosis:  Pt would benefit from PT.   Emotional Assessment Appearance:  Appears stated age Attitude/Demeanor/Rapport:  Apprehensive Affect (typically observed):  Accepting, Adaptable, Pleasant Orientation:  Oriented to Self,  Oriented to Place, Oriented to  Time, Oriented to Situation Alcohol / Substance use:  Never Used Psych involvement (Current and /or in the community):  No (Comment)  Discharge Needs  Concerns to be addressed:  Adjustment to Illness Readmission within the last 30 days:  No Current discharge risk:  Chronically ill Barriers to Discharge:  Continued Medical Work up   [employer], LCSW 04/13/2015, 5:08 PM

## 2015-04-14 ENCOUNTER — Inpatient Hospital Stay: Payer: Commercial Managed Care - HMO

## 2015-04-14 LAB — BASIC METABOLIC PANEL
ANION GAP: 5 (ref 5–15)
BUN: 11 mg/dL (ref 6–20)
CALCIUM: 7.3 mg/dL — AB (ref 8.9–10.3)
CO2: 26 mmol/L (ref 22–32)
Chloride: 95 mmol/L — ABNORMAL LOW (ref 101–111)
Creatinine, Ser: 0.71 mg/dL (ref 0.44–1.00)
GLUCOSE: 103 mg/dL — AB (ref 65–99)
Potassium: 3.3 mmol/L — ABNORMAL LOW (ref 3.5–5.1)
SODIUM: 126 mmol/L — AB (ref 135–145)

## 2015-04-14 LAB — TYPE AND SCREEN
ABO/RH(D): A POS
Antibody Screen: NEGATIVE
UNIT DIVISION: 0

## 2015-04-14 LAB — MAGNESIUM: MAGNESIUM: 1.7 mg/dL (ref 1.7–2.4)

## 2015-04-14 LAB — CBC
HCT: 27.4 % — ABNORMAL LOW (ref 35.0–47.0)
Hemoglobin: 9.1 g/dL — ABNORMAL LOW (ref 12.0–16.0)
MCH: 29.5 pg (ref 26.0–34.0)
MCHC: 33.2 g/dL (ref 32.0–36.0)
MCV: 89 fL (ref 80.0–100.0)
PLATELETS: 198 10*3/uL (ref 150–440)
RBC: 3.08 MIL/uL — ABNORMAL LOW (ref 3.80–5.20)
RDW: 16.1 % — AB (ref 11.5–14.5)
WBC: 6.8 10*3/uL (ref 3.6–11.0)

## 2015-04-14 LAB — ECHOCARDIOGRAM COMPLETE
Height: 66.5 in
WEIGHTICAEL: 3470.4 [oz_av]

## 2015-04-14 MED ORDER — POLYETHYLENE GLYCOL 3350 17 G PO PACK: 17.0000 g | PACK | Freq: Every day | ORAL | Status: DC | PRN

## 2015-04-14 MED ORDER — DOCUSATE SODIUM 100 MG PO CAPS: 100.0000 mg | ORAL_CAPSULE | Freq: Two times a day (BID) | ORAL | Status: DC | PRN

## 2015-04-14 MED ORDER — LACTULOSE 10 GM/15ML PO SOLN: 30.0000 g | Freq: Every day | ORAL | Status: DC | PRN

## 2015-04-14 MED ORDER — SODIUM CHLORIDE 0.9 % IV SOLN
INTRAVENOUS | Status: DC
  Administered 2015-04-14 – 2015-04-15 (×3): via INTRAVENOUS

## 2015-04-14 MED ORDER — POTASSIUM CHLORIDE CRYS ER 20 MEQ PO TBCR
40.0000 meq | EXTENDED_RELEASE_TABLET | Freq: Once | ORAL | Status: AC
  Administered 2015-04-14: 40 meq via ORAL
  Filled 2015-04-14: qty 2

## 2015-04-14 NOTE — Progress Notes (Signed)
[facility] [place] at [place] NAME: Laurrie Toppin    MR#:  [id number]  DATE OF BIRTH:  [birth year]  SUBJECTIVE:   Chest pain at chest tube site. Had BM yesterday.  REVIEW OF SYSTEMS:    Review of Systems  Constitutional: Negative for fever, chills and malaise/fatigue.  HENT: Negative for ear discharge, ear pain, hearing loss, nosebleeds, sore throat and tinnitus.   Eyes: Negative for blurred vision and pain.  Respiratory: Negative for cough, hemoptysis, sputum production, shortness of breath, wheezing and stridor.   Cardiovascular: Negative for chest pain, palpitations and leg swelling.  Gastrointestinal: Negative for nausea, vomiting, abdominal pain, diarrhea and blood in stool.  Genitourinary: Negative for dysuria.  Musculoskeletal: Negative for back pain.  Skin: Negative for rash.  Neurological: Negative for dizziness, tremors, speech change, focal weakness, seizures and headaches.  Endo/Heme/Allergies: Does not bruise/bleed easily.  Psychiatric/Behavioral: Negative for depression, suicidal ideas and hallucinations. The patient is not nervous/anxious.     Tolerating Diet: Yes      DRUG ALLERGIES:   Allergies  Allergen Reactions  . Fish Allergy Anaphylaxis    Patient allergic to perch only. She can eat other fish.  . Sulfa Antibiotics Hives    VITALS:  Blood pressure 126/61, pulse 84, temperature 99.1 F (37.3 C), temperature source Oral, resp. rate 20, height 5' 6.5" (1.689 m), weight 97.523 kg (215 lb), SpO2 97 %.  PHYSICAL EXAMINATION:   Physical Exam  Constitutional: She is oriented to person, place, and time and well-developed, well-nourished, and in no distress. No distress. obese. HENT:  Head: Normocephalic.  Eyes: No scleral icterus.  Neck: Normal range of motion. Neck supple. No JVD present. No tracheal deviation present.  Cardiovascular: Normal rate, regular rhythm and normal heart sounds.  Exam reveals no  gallop and no friction rub.   No murmur heard. Pulmonary/Chest: Effort normal and breath sounds normal. No respiratory distress. She has no wheezes. She has no rales. No use of accessory muscle to breath.  Chest tube placed on right side with bloody drainage.  Abdominal: Soft. Bowel sounds are normal. She exhibits no distension and no mass. There is no tenderness. There is no rebound and no guarding.  Colostomy in situ. Musculoskeletal: Normal range of motion. She exhibits no edema.  Neurological: She is alert and oriented to person, place, and time.  Skin: Skin is warm. No rash noted. No erythema.  Psychiatric: Affect and judgment normal.      LABORATORY PANEL:   CBC  Recent Labs Lab 04/14/15 0531  WBC 6.8  HGB 9.1*  HCT 27.4*  PLT 198   ------------------------------------------------------------------------------------------------------------------  Chemistries   Recent Labs Lab 04/09/15 0502  04/14/15 0531  NA 136  < > 126*  K 3.1*  < > 3.3*  CL 104  < > 95*  CO2 30  < > 26  GLUCOSE 136*  < > 103*  BUN 9  < > 11  CREATININE 0.85  < > 0.71  CALCIUM 7.9*  < > 7.3*  MG 1.9  < > 1.7  AST 30  --   --   ALT 12*  --   --   ALKPHOS 115  --   --   BILITOT <0.1*  --   --   < > = values in this interval not displayed. ------------------------------------------------------------------------------------------------------------------  Cardiac Enzymes  Recent Labs Lab 04/08/15 0929  TROPONINI <0.03   ------------------------------------------------------------------------------------------------------------------  RADIOLOGY:  Dg Chest 1 View  04/14/2015  CLINICAL DATA:  Followup right pleural effusion with chest tube in place. EXAM: Portable CHEST 1 VIEW COMPARISON:  04/12/2015 and earlier, including CTA chest 03/05/2015. FINDINGS: Markedly suboptimal inspiration with atelectasis in the lung bases, right greater than left, worse than 2 days ago. Right chest tube in  place with no pneumothorax. Residual small loculated right pleural effusion, unchanged. Cardiac silhouette mildly enlarged, unchanged. Pulmonary vascularity normal. Right arm PICC tip projects at the cavoatrial junction. IMPRESSION: 1. Suboptimal inspiration. Bibasilar atelectasis, right greater than left, worse than on the examination 2 days ago. 2. Right chest tube in place with no pneumothorax. Stable small loculated right pleural effusion. Electronically Signed   By: Evangeline Dakin M.D.   On: 04/14/2015 09:28     ASSESSMENT AND PLAN:    60 y.o. female has a past medical history significant for COPD and bipolar D/O recently admitted for COPD exacerbation now with progressive SOB and enlarging right pleural effusion.  1. Recurrent right pleural effusion:  due to malignant pleural effusion. She is POD #4 for thorascopic talc pleurodesis with pleural biopsy and chest tube placement by Dr. Genevive Bi . Leave chest tube in place until drainage is less than 200 cc per day. CXR in am. 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  Per Dr. Mike Gip, palliative chemotherapy with cisplatin and 5-FU after 1-2 weeks.   2. COPD: No signs of exacerbation Continue inhalers, nebulizer   3. Bipolar disorder: Continue Abilify clonazepam, Cymbalta and Lexapro   4. Tobacco dependence: . Continue nicotine patch and encouragement to remain abstinent.   5. History of anal cancer: Patient currently has a colostomy. There has been discussion with her surgeon for reversal in the future. Constipation improved on MiraLAX, lactulose and Colace.   6. Neck pain: Resolved   7. Acute on chronic iron deficiency anemia: Patient has consented for blood transfusion. Hemoglobin increased to 9.1 after transfusion. Anemia panel ordered and it appears patient has a low iron and low iron saturation Started iron supplementation, daily for now and then if tolerated twice a  day.  Hyponatremia. NS iv, f/u BMP. Hypokalemia. KCl, f/u BMP.  Management plans discussed with the patient and she is in agreement.  CODE STATUS: FULL  TOTAL TIME TAKING CARE OF THIS PATIENT: 37 minutes.   Physical therapy is recommended skilled nursing facility at discharge  POSSIBLE D/C 2-3 days, DEPENDING ON CLINICAL CONDITION.   Demetrios Loll M.D on 04/14/2015 at 1:18 PM  Between 7am to 6pm - Pager - [phone] After 6pm go to [email or web address] - password [email or web address] [place]  Office  [phone]  CC: Primary care physician; [name], NP  Note: This dictation was prepared with Dragon dictation along with smaller phrase technology. Any transcriptional errors that result from this process are unintentional.

## 2015-04-14 NOTE — Progress Notes (Signed)
s/ post VATS pleurodesis for malignant pleural effusion 230 cc and no air leak CT  PE NAD LUNGS: decrease BS right  CXR lung up, reasonable expansion  A/P CT water seal CXR in am

## 2015-04-14 NOTE — Progress Notes (Signed)
PT Cancellation Note  Patient Details Name: [name] MRN: [id number] DOB: [birth year]   Cancelled Treatment:    Reason Eval/Treat Not Completed: Other (comment) (States she just doesn't have the energy but agreed to tomorr)ow.   Ramond Dial 04/14/2015, 3:28 PM   Mee Hives, PT MS Acute Rehab Dept. Number: [id number] and [place] [phone]

## 2015-04-15 ENCOUNTER — Inpatient Hospital Stay: Payer: Commercial Managed Care - HMO

## 2015-04-15 LAB — BASIC METABOLIC PANEL
ANION GAP: 4 — AB (ref 5–15)
BUN: 11 mg/dL (ref 6–20)
CALCIUM: 7.6 mg/dL — AB (ref 8.9–10.3)
CO2: 27 mmol/L (ref 22–32)
Chloride: 101 mmol/L (ref 101–111)
Creatinine, Ser: 0.72 mg/dL (ref 0.44–1.00)
GFR calc non Af Amer: >60 mL/min (ref >60)
Glucose, Bld: 102 mg/dL — ABNORMAL HIGH (ref 65–99)
POTASSIUM: 3.9 mmol/L (ref 3.5–5.1)
Sodium: 132 mmol/L — ABNORMAL LOW (ref 135–145)

## 2015-04-15 LAB — MAGNESIUM: Magnesium: 1.7 mg/dL (ref 1.7–2.4)

## 2015-04-15 LAB — TROPONIN I: Troponin I: <0.03 ng/mL (ref <0.031)

## 2015-04-15 MED ORDER — MORPHINE SULFATE (PF) 2 MG/ML IV SOLN
2.0000 mg | Freq: Once | INTRAVENOUS | Status: AC
  Administered 2015-04-15: 2 mg via INTRAVENOUS

## 2015-04-15 MED ORDER — MORPHINE SULFATE (PF) 2 MG/ML IV SOLN
INTRAVENOUS | Status: AC
  Filled 2015-04-15: qty 1

## 2015-04-15 NOTE — Plan of Care (Addendum)
Problem: Physical Regulation: Goal: Ability to maintain clinical measurements within normal limits will improve Outcome: Progressing Pt lethargic today. More awake towards the end of shift. VSS. Pt very weak today and uses the bedpan. Chest tube to water seal continues. 13m output via chest tube througout the shift.

## 2015-04-15 NOTE — Progress Notes (Signed)
[place] at [place] NAME: [name]    MR#:  [id number]  DATE OF BIRTH:  [birth year]  SUBJECTIVE:   Weakness. She had chest pain last night, troponin is negative.  REVIEW OF SYSTEMS:    Review of Systems  Constitutional: Negative for fever, chills, but has malaise/fatigue.  HENT: Negative for ear discharge, ear pain, hearing loss, nosebleeds, sore throat and tinnitus.   Eyes: Negative for blurred vision and pain.  Respiratory: Negative for cough, hemoptysis, sputum production, shortness of breath, wheezing and stridor.   Cardiovascular: Negative for chest pain, palpitations and leg swelling.  Gastrointestinal: Negative for nausea, vomiting, abdominal pain, diarrhea and blood in stool.  Genitourinary: Negative for dysuria.  Musculoskeletal: Negative for back pain.  Skin: Negative for rash.  Neurological: Negative for dizziness, tremors, speech change, focal weakness, seizures and headaches.  Endo/Heme/Allergies: Does not bruise/bleed easily.  Psychiatric/Behavioral: Negative for depression, suicidal ideas and hallucinations. The patient is not nervous/anxious.     Tolerating Diet: Yes      DRUG ALLERGIES:   Allergies  Allergen Reactions  . Fish Allergy Anaphylaxis    Patient allergic to perch only. She can eat other fish.  . Sulfa Antibiotics Hives    VITALS:  Blood pressure 125/65, pulse 91, temperature 98.4 F (36.9 C), temperature source Oral, resp. rate 16, height 5' 6.5" (1.689 m), weight 98.204 kg (216 lb 8 oz), SpO2 97 %.  PHYSICAL EXAMINATION:   Physical Exam  Constitutional: She is oriented to person, place, and time and well-developed, well-nourished, but lethargy.  obese.  HENT:  Head: Normocephalic.  Eyes: No scleral icterus.  Neck: Normal range of motion. Neck supple. No JVD present. No tracheal deviation present.  Cardiovascular: Normal rate, regular rhythm and normal heart sounds.  Exam  reveals no gallop and no friction rub.   No murmur heard. Pulmonary/Chest: Effort normal and breath sounds normal. No respiratory distress. She has no wheezes. She has no rales. No use of accessory muscle to breath.  Chest tube placed on right side with bloody drainage.  Abdominal: Soft. Bowel sounds are normal. She exhibits no distension and no mass. There is no tenderness. There is no rebound and no guarding.  Colostomy in situ. Musculoskeletal: Normal range of motion. She exhibits no edema.  Neurological: She is alert and oriented to person, place, and time.  Skin: Skin is warm. No rash noted. No erythema.  Psychiatric: Affect and judgment normal.      LABORATORY PANEL:   CBC  Recent Labs Lab 04/14/15 0531  WBC 6.8  HGB 9.1*  HCT 27.4*  PLT 198   ------------------------------------------------------------------------------------------------------------------  Chemistries   Recent Labs Lab 04/09/15 0502  04/15/15 0453  NA 136  < > 132*  K 3.1*  < > 3.9  CL 104  < > 101  CO2 30  < > 27  GLUCOSE 136*  < > 102*  BUN 9  < > 11  CREATININE 0.85  < > 0.72  CALCIUM 7.9*  < > 7.6*  MG 1.9  < > 1.7  AST 30  --   --   ALT 12*  --   --   ALKPHOS 115  --   --   BILITOT <0.1*  --   --   < > = values in this interval not displayed. ------------------------------------------------------------------------------------------------------------------  Cardiac Enzymes  Recent Labs Lab 04/15/15 0607  TROPONINI <0.03   ------------------------------------------------------------------------------------------------------------------  RADIOLOGY:  Dg Chest 1 View  04/15/2015  CLINICAL DATA:  Followup right pleural effusion and basilar atelectasis EXAM: Portable CHEST 1 VIEW COMPARISON:  04/14/2015 and earlier. FINDINGS: Markedly suboptimal inspiration. Atelectasis in the lung bases, unchanged. Right chest tube in place with residual small loculated right pleural effusion,  unchanged. No new pulmonary parenchymal abnormalities. Cardiac silhouette mildly enlarged, unchanged. Pulmonary vascularity normal. Right arm PICC tip projects over the lower SVC, unchanged. IMPRESSION: 1.  Support apparatus satisfactory. 2. Markedly suboptimal inspiration with stable bibasilar atelectasis. 3. Stable small loculated right pleural effusion with right chest tube in place. 4. No new abnormalities. Electronically Signed   By: Evangeline Dakin M.D.   On: 04/15/2015 08:36   Dg Chest 1 View  04/14/2015  CLINICAL DATA:  Followup right pleural effusion with chest tube in place. EXAM: Portable CHEST 1 VIEW COMPARISON:  04/12/2015 and earlier, including CTA chest 03/05/2015. FINDINGS: Markedly suboptimal inspiration with atelectasis in the lung bases, right greater than left, worse than 2 days ago. Right chest tube in place with no pneumothorax. Residual small loculated right pleural effusion, unchanged. Cardiac silhouette mildly enlarged, unchanged. Pulmonary vascularity normal. Right arm PICC tip projects at the cavoatrial junction. IMPRESSION: 1. Suboptimal inspiration. Bibasilar atelectasis, right greater than left, worse than on the examination 2 days ago. 2. Right chest tube in place with no pneumothorax. Stable small loculated right pleural effusion. Electronically Signed   By: Evangeline Dakin M.D.   On: 04/14/2015 09:28     ASSESSMENT AND PLAN:    60 y.o. female has a past medical history significant for COPD and bipolar D/O recently admitted for COPD exacerbation now with progressive SOB and enlarging right pleural effusion.  1. Recurrent right pleural effusion:  due to malignant pleural effusion. She is POD #5 for thorascopic talc pleurodesis with pleural biopsy and chest tube placement by Dr. Genevive Bi . Leave chest tube in place until drainage is less than 200 cc per day. 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  Per Dr. Mike Gip,  palliative chemotherapy with cisplatin and 5-FU after 1-2 weeks. Per Dr. Dahlia Byes, surgeon, recommend keeping the tube in given her overall deterioration.   2. COPD: No signs of exacerbation Continue inhalers, nebulizer   3. Bipolar disorder: Continue Abilify clonazepam, Cymbalta and Lexapro   4. Tobacco dependence: . Continue nicotine patch and encouragement to remain abstinent.   5. History of anal cancer: Patient currently has a colostomy. There has been discussion with her surgeon for reversal in the future. Constipation improved with MiraLAX, lactulose and Colace.   6. Neck pain: Resolved   7. Acute on chronic iron deficiency anemia: Patient has consented for blood transfusion. Hemoglobin increased to 9.1 after transfusion. Anemia panel ordered and it appears patient has a low iron and low iron saturation Started iron supplementation, daily for now and then if tolerated twice a day.  Hyponatremia. Improving with NS iv, f/u BMP. Hypokalemia. Improved with KCl.  She has poor prognosis and her condition is declining. Palliative care consult. Called her daughter at [phone], but nobody answered the phone. Management plans discussed with the patient and she is in agreement.  CODE STATUS: FULL  TOTAL TIME TAKING CARE OF THIS PATIENT: 37 minutes.   Physical therapy is recommended skilled nursing facility at discharge  POSSIBLE D/C 2-3 days, DEPENDING ON CLINICAL CONDITION.   Demetrios Loll M.D on 04/15/2015 at 12:56 PM  Between 7am to 6pm - Pager - [phone] After 6pm go to [email or web address] - [place]  [facility]  Office  [phone]  CC: Primary care physician; [name], NP  Note: This dictation was prepared with Dragon dictation along with smaller phrase technology. Any transcriptional errors that result from this process are unintentional.

## 2015-04-15 NOTE — Progress Notes (Signed)
Pt looked like she had a run of vtach on the monitor at about 6:10am. Pt at the time reported sharp chest pain radiating to back. Paged and spoke with Dr. Marcille Blanco who ordered stat CXR for tube placement, EKG, troponin and morphine. Pt also desat to 26s on 2L [place]. Pt O2 increase to 3L but had periods of desat to low 80s. Pt was increased to 4L and has sustained O2 of >90% for more than 10 minutes. Pt report decreased pain after morphine was given. Pt chest tube dressing was wet and so dressing was also replaced. Minimal drainage at the site. Pt troponin was negative and EKG showed sinus rhythm. Pass information unto oncoming nurse.

## 2015-04-15 NOTE — Progress Notes (Signed)
s/post VATS pleurodesis for malignancy Less than 200 cc from the drain although nurses report that the chest tube dressing site was soaked at least 2 times. Therefore the output should be more. She did have a run of V. tach and over the last 24 hours her overall condition has deteriorated  PE , tachypneic and pale Chest: Ct serous, decrease BS on the right  A/ p recommend keeping the tube in given her overall deterioration. The last thing we 1 is for her to have a pneumothorax and complicated her condition. He might be just wise to keep the tube until her overall condition improves

## 2015-04-16 LAB — BASIC METABOLIC PANEL
Anion gap: 4 — ABNORMAL LOW (ref 5–15)
BUN: 13 mg/dL (ref 6–20)
CO2: 27 mmol/L (ref 22–32)
CREATININE: 0.79 mg/dL (ref 0.44–1.00)
Calcium: 7.7 mg/dL — ABNORMAL LOW (ref 8.9–10.3)
Chloride: 104 mmol/L (ref 101–111)
Glucose, Bld: 107 mg/dL — ABNORMAL HIGH (ref 65–99)
POTASSIUM: 3.3 mmol/L — AB (ref 3.5–5.1)
SODIUM: 135 mmol/L (ref 135–145)

## 2015-04-16 LAB — CBC
HEMATOCRIT: 26.6 % — AB (ref 35.0–47.0)
Hemoglobin: 8.8 g/dL — ABNORMAL LOW (ref 12.0–16.0)
MCH: 30 pg (ref 26.0–34.0)
MCHC: 33.2 g/dL (ref 32.0–36.0)
MCV: 90.3 fL (ref 80.0–100.0)
PLATELETS: 160 10*3/uL (ref 150–440)
RBC: 2.94 MIL/uL — ABNORMAL LOW (ref 3.80–5.20)
RDW: 16.7 % — AB (ref 11.5–14.5)
WBC: 8.7 10*3/uL (ref 3.6–11.0)

## 2015-04-16 LAB — MAGNESIUM: Magnesium: 1.7 mg/dL (ref 1.7–2.4)

## 2015-04-16 MED ORDER — ENOXAPARIN SODIUM 40 MG/0.4ML ~~LOC~~ SOLN
40.0000 mg | SUBCUTANEOUS | Status: DC
  Administered 2015-04-16: 40 mg via SUBCUTANEOUS
  Filled 2015-04-16: qty 0.4

## 2015-04-16 MED ORDER — ACETAMINOPHEN 325 MG PO TABS: 650.0000 mg | ORAL_TABLET | Freq: Four times a day (QID) | ORAL | Status: DC | PRN

## 2015-04-16 MED ORDER — FERROUS SULFATE 325 (65 FE) MG PO TABS
325.0000 mg | ORAL_TABLET | Freq: Two times a day (BID) | ORAL | Status: DC
  Administered 2015-04-16 – 2015-04-17 (×3): 325 mg via ORAL
  Filled 2015-04-16 (×3): qty 1

## 2015-04-16 MED ORDER — POTASSIUM CHLORIDE CRYS ER 20 MEQ PO TBCR
40.0000 meq | EXTENDED_RELEASE_TABLET | Freq: Once | ORAL | Status: AC
  Administered 2015-04-16: 40 meq via ORAL
  Filled 2015-04-16: qty 2

## 2015-04-16 MED ORDER — MAGNESIUM SULFATE 2 GM/50ML IV SOLN
2.0000 g | Freq: Once | INTRAVENOUS | Status: AC
  Administered 2015-04-16: 11:00:00 2 g via INTRAVENOUS
  Filled 2015-04-16: qty 50

## 2015-04-16 NOTE — Care Management Important Message (Signed)
Important Message  Patient Details  Name: [name] MRN: [id number] Date of Birth: 1955-12-26   Medicare Important Message Given:  Yes    [name] 04/16/2015, 1:56 PM

## 2015-04-16 NOTE — Progress Notes (Signed)
Seen by Dr. Mike Gip.  May beging chemotherapy in a few weeks.  Afebrile  CT drainage is less than 100 cc and is declining Wounds are clean and dry CXR shows minimal fluid CT removed without incident  Will need to see in one week for suture removal.  Marta Lamas.

## 2015-04-16 NOTE — Progress Notes (Signed)
[place] at [place] NAME: [name]    MR#:  [id number]  DATE OF BIRTH:  [birth year]  SUBJECTIVE:   She feels better. Chest tube was removed this am.  REVIEW OF SYSTEMS:    Review of Systems  Constitutional: Negative for fever, chills, but has malaise/fatigue.  HENT: Negative for ear discharge, ear pain, hearing loss, nosebleeds, sore throat and tinnitus.   Eyes: Negative for blurred vision and pain.  Respiratory: Negative for cough, hemoptysis, sputum production, shortness of breath, wheezing and stridor.   Cardiovascular: Negative for chest pain, palpitations and leg swelling.  Gastrointestinal: Negative for nausea, vomiting, abdominal pain, diarrhea and blood in stool.  Genitourinary: Negative for dysuria.  Musculoskeletal: Negative for back pain.  Skin: Negative for rash.  Neurological: Negative for dizziness, tremors, speech change, focal weakness, seizures and headaches.  Endo/Heme/Allergies: Does not bruise/bleed easily.  Psychiatric/Behavioral: Negative for depression, suicidal ideas and hallucinations. The patient is not nervous/anxious.     Tolerating Diet: Yes      DRUG ALLERGIES:   Allergies  Allergen Reactions  . Fish Allergy Anaphylaxis    Patient allergic to perch only. She can eat other fish.  . Sulfa Antibiotics Hives    VITALS:  Blood pressure 105/49, pulse 77, temperature 97.8 F (36.6 C), temperature source Oral, resp. rate 20, height 5' 6.5" (1.689 m), weight 101.424 kg (223 lb 9.6 oz), SpO2 100 %.  PHYSICAL EXAMINATION:   Physical Exam  Constitutional: She is oriented to person, place, and time and well-developed, well-nourished, but lethargy.  obese.  HENT:  Head: Normocephalic.  Eyes: No scleral icterus.  Neck: Normal range of motion. Neck supple. No JVD present. No tracheal deviation present.  Cardiovascular: Normal rate, regular rhythm and normal heart sounds.  Exam reveals no  gallop and no friction rub.   No murmur heard. Pulmonary/Chest: Effort normal and breath sounds normal. No respiratory distress. She has no wheezes. She has mild crackles on right side. No use of accessory muscle to breath. .  Abdominal: Soft. Bowel sounds are normal. She exhibits no distension and no mass. There is no tenderness. There is no rebound and no guarding.  Colostomy in situ. Musculoskeletal: Normal range of motion. She exhibits no edema.  Neurological: She is alert and oriented to person, place, and time.  Skin: Skin is warm. No rash noted. No erythema.  Psychiatric: Affect and judgment normal.      LABORATORY PANEL:   CBC  Recent Labs Lab 04/16/15 0540  WBC 8.7  HGB 8.8*  HCT 26.6*  PLT 160   ------------------------------------------------------------------------------------------------------------------  Chemistries   Recent Labs Lab 04/16/15 0540  NA 135  K 3.3*  CL 104  CO2 27  GLUCOSE 107*  BUN 13  CREATININE 0.79  CALCIUM 7.7*  MG 1.7   ------------------------------------------------------------------------------------------------------------------  Cardiac Enzymes  Recent Labs Lab 04/15/15 0607  TROPONINI <0.03   ------------------------------------------------------------------------------------------------------------------  RADIOLOGY:  Dg Chest 1 View  04/15/2015  CLINICAL DATA:  Followup right pleural effusion and basilar atelectasis EXAM: Portable CHEST 1 VIEW COMPARISON:  04/14/2015 and earlier. FINDINGS: Markedly suboptimal inspiration. Atelectasis in the lung bases, unchanged. Right chest tube in place with residual small loculated right pleural effusion, unchanged. No new pulmonary parenchymal abnormalities. Cardiac silhouette mildly enlarged, unchanged. Pulmonary vascularity normal. Right arm PICC tip projects over the lower SVC, unchanged. IMPRESSION: 1.  Support apparatus satisfactory. 2. Markedly suboptimal inspiration with  stable bibasilar atelectasis. 3. Stable small loculated right pleural  effusion with right chest tube in place. 4. No new abnormalities. Electronically Signed   By: Evangeline Dakin M.D.   On: 04/15/2015 08:36     ASSESSMENT AND PLAN:    60 y.o. female has a past medical history significant for COPD and bipolar D/O recently admitted for COPD exacerbation now with progressive SOB and enlarging right pleural effusion.  1. Recurrent right pleural effusion:  due to malignant pleural effusion. She is POD #6 for thorascopic talc pleurodesis with pleural biopsy and chest tube placement by Dr. Genevive Bi . 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  Per Dr. Mike Gip, palliative chemotherapy with cisplatin and 5-FU after 1-2 weeks. Chest tube was removed by Dr. Genevive Bi this am.   2. COPD: No signs of exacerbation Continue inhalers, nebulizer   3. Bipolar disorder: Continue Abilify clonazepam, Cymbalta and Lexapro   4. Tobacco dependence: . Continue nicotine patch and encouragement to remain abstinent.   5. History of anal cancer: Patient currently has a colostomy. There has been discussion with her surgeon for reversal in the future. Constipation improved with MiraLAX, lactulose and Colace.   6. Neck pain: Resolved   7. Acute on chronic iron deficiency anemia: Patient has consented for blood transfusion. Hemoglobin increased to 9.1 after transfusion. Anemia panel ordered and it appears patient has a low iron and low iron saturation Started iron supplementation, daily, change to twice a day. Hb 8.8.  Hyponatremia. Improved with NS iv. Hypokalemia. given KCl.  She has poor prognosis and her condition is declining. Palliative care consult.  Management plans discussed with the patient and she is in agreement.  PT  CODE STATUS: FULL  TOTAL TIME TAKING CARE OF THIS PATIENT: 35 minutes.   Physical therapy is recommended skilled nursing  facility at discharge  POSSIBLE D/C 2 days, DEPENDING ON CLINICAL CONDITION.   Demetrios Loll M.D on 04/16/2015 at 12:35 PM  Between 7am to 6pm - Pager - [phone] After 6pm go to [email or web address] - password [email or web address] [place] [facility]  Office  [phone]  CC: Primary care physician; [name], NP  Note: This dictation was prepared with Dragon dictation along with smaller phrase technology. Any transcriptional errors that result from this process are unintentional.

## 2015-04-16 NOTE — Progress Notes (Signed)
Physical Therapy Treatment Patient Details Name: [name] MRN: [id number] DOB: 1955-10-11 Today's Date: 04/16/2015    History of Present Illness Pt is a 60 y.o. female with PMH of cancer, COPD, bipolar disorder, PTSD, UE DVT, and a colostomy (05-26-14).  Pt presented with SOB and right pleural effusion.  Pt was admitted for COPD exacerbation.  Pt is s/p thoracoscopy with chest tube placement (04-10-15)    PT Comments    Pt reports feeling better today despite soreness at left upper back/shoulder due to chest tube removal. Pt participates well with up to edge of bed, short ambulation to chair and seated/long sit exercises. Pt only requires supervision to Min guard assist. Pt does not wish to ambulate further at this time. Pt able to self reposition in chair to avoid increased pressure on sacral sore. Continue PT for progression of strength, endurance and all functional mobility.   Follow Up Recommendations  SNF     Equipment Recommendations  Rolling walker with 5" wheels    Recommendations for Other Services       Precautions / Restrictions Restrictions Weight Bearing Restrictions: No    Mobility  Bed Mobility Overal bed mobility: Modified Independent             General bed mobility comments: Increased time; use of rails  Transfers Overall transfer level: Needs assistance Equipment used: Rolling walker (2 wheeled) Transfers: Sit to/from Stand Sit to Stand: Supervision         General transfer comment: Good use of hands; demonstrates good speed and full upright posture without difficulty  Ambulation/Gait Ambulation/Gait assistance: Min guard Ambulation Distance (Feet): 5 Feet Assistive device: Rolling walker (2 wheeled) Gait Pattern/deviations:  (several steps bed to chair) Gait velocity: decreased  Gait velocity interpretation: Below normal speed for age/gender General Gait Details: Pt did not wish to ambulate further than bed to chair   Stairs             Wheelchair Mobility    Modified Rankin (Stroke Patients Only)       Balance Overall balance assessment: Needs assistance Sitting-balance support: Bilateral upper extremity supported;Feet supported Sitting balance-Leahy Scale: Good     Standing balance support: Bilateral upper extremity supported Standing balance-Leahy Scale: Good                      Cognition Arousal/Alertness: Awake/alert Behavior During Therapy: WFL for tasks assessed/performed Overall Cognitive Status: Within Functional Limits for tasks assessed                      Exercises General Exercises - Lower Extremity Ankle Circles/Pumps: AROM;Both;20 reps (long sit) Quad Sets: Strengthening;Both;20 reps (long sit) Gluteal Sets: Strengthening;Both;20 reps (long sit) Long Arc Quad: AROM;Strengthening;Both;10 reps;Seated (2 sets) Hip ABduction/ADduction: AROM;Strengthening;Both;10 reps;Seated (2 sets with straight leg) Hip Flexion/Marching: AROM;Both;20 reps;Seated Toe Raises: AROM;Both;20 reps;Seated Heel Raises: AROM;Both;20 reps;Seated Other Exercises Other Exercises: adductor squeeze 20x    General Comments        Pertinent Vitals/Pain Pain Assessment: 0-10 Pain Score: 8  Pain Location: L upper back/shoulder Pain Descriptors / Indicators: Aching;Constant Pain Intervention(s): Limited activity within patient's tolerance;Monitored during session;Premedicated before session    Home Living                      Prior Function            PT Goals (current goals can now be found in the care plan section) Progress  towards PT goals: Progressing toward goals    Frequency  Min 2X/week    PT Plan Current plan remains appropriate    Co-evaluation             End of Session Equipment Utilized During Treatment: Oxygen Activity Tolerance: Patient tolerated treatment well Patient left: in chair;with call bell/phone within reach;with chair alarm set     Time:  1962-2297 PT Time Calculation (min) (ACUTE ONLY): 23 min  Charges:  $Gait Training: 8-22 mins $Therapeutic Exercise: 8-22 mins                    G Codes:      Charlaine Dalton, PTA 04/16/2015, 4:40 PM

## 2015-04-17 ENCOUNTER — Emergency Department: Payer: Commercial Managed Care - HMO

## 2015-04-17 ENCOUNTER — Inpatient Hospital Stay
Admission: EM | Admit: 2015-04-17 | Discharge: 2015-04-21 | DRG: 378 | Disposition: A | Discharge status: Home Health | Payer: Commercial Managed Care - HMO | Attending: Internal Medicine | Admitting: Internal Medicine

## 2015-04-17 DIAGNOSIS — B961 Klebsiella pneumoniae [K. pneumoniae] as the cause of diseases classified elsewhere: Secondary | ICD-10-CM | POA: Diagnosis present

## 2015-04-17 DIAGNOSIS — K922 Gastrointestinal hemorrhage, unspecified: Secondary | ICD-10-CM | POA: Diagnosis present

## 2015-04-17 DIAGNOSIS — N3000 Acute cystitis without hematuria: Secondary | ICD-10-CM | POA: Diagnosis present

## 2015-04-17 DIAGNOSIS — Z91013 Allergy to seafood: Secondary | ICD-10-CM

## 2015-04-17 DIAGNOSIS — K921 Melena: Principal | ICD-10-CM | POA: Diagnosis present

## 2015-04-17 DIAGNOSIS — Z933 Colostomy status: Secondary | ICD-10-CM

## 2015-04-17 DIAGNOSIS — M4855XS Collapsed vertebra, not elsewhere classified, thoracolumbar region, sequela of fracture: Secondary | ICD-10-CM | POA: Diagnosis present

## 2015-04-17 DIAGNOSIS — J45909 Unspecified asthma, uncomplicated: Secondary | ICD-10-CM | POA: Diagnosis present

## 2015-04-17 DIAGNOSIS — C210 Malignant neoplasm of anus, unspecified: Secondary | ICD-10-CM | POA: Diagnosis present

## 2015-04-17 DIAGNOSIS — E785 Hyperlipidemia, unspecified: Secondary | ICD-10-CM | POA: Diagnosis present

## 2015-04-17 DIAGNOSIS — Z8249 Family history of ischemic heart disease and other diseases of the circulatory system: Secondary | ICD-10-CM

## 2015-04-17 DIAGNOSIS — C799 Secondary malignant neoplasm of unspecified site: Secondary | ICD-10-CM

## 2015-04-17 DIAGNOSIS — K219 Gastro-esophageal reflux disease without esophagitis: Secondary | ICD-10-CM | POA: Diagnosis present

## 2015-04-17 DIAGNOSIS — M5136 Other intervertebral disc degeneration, lumbar region: Secondary | ICD-10-CM | POA: Diagnosis present

## 2015-04-17 DIAGNOSIS — Z7951 Long term (current) use of inhaled steroids: Secondary | ICD-10-CM

## 2015-04-17 DIAGNOSIS — C7800 Secondary malignant neoplasm of unspecified lung: Secondary | ICD-10-CM | POA: Diagnosis present

## 2015-04-17 DIAGNOSIS — K5730 Diverticulosis of large intestine without perforation or abscess without bleeding: Secondary | ICD-10-CM | POA: Diagnosis present

## 2015-04-17 DIAGNOSIS — M797 Fibromyalgia: Secondary | ICD-10-CM | POA: Diagnosis present

## 2015-04-17 DIAGNOSIS — Z86718 Personal history of other venous thrombosis and embolism: Secondary | ICD-10-CM

## 2015-04-17 DIAGNOSIS — Z882 Allergy status to sulfonamides status: Secondary | ICD-10-CM

## 2015-04-17 DIAGNOSIS — F329 Major depressive disorder, single episode, unspecified: Secondary | ICD-10-CM | POA: Diagnosis present

## 2015-04-17 DIAGNOSIS — E039 Hypothyroidism, unspecified: Secondary | ICD-10-CM | POA: Diagnosis present

## 2015-04-17 DIAGNOSIS — Z7982 Long term (current) use of aspirin: Secondary | ICD-10-CM

## 2015-04-17 DIAGNOSIS — Z85048 Personal history of other malignant neoplasm of rectum, rectosigmoid junction, and anus: Secondary | ICD-10-CM

## 2015-04-17 DIAGNOSIS — I10 Essential (primary) hypertension: Secondary | ICD-10-CM | POA: Diagnosis present

## 2015-04-17 DIAGNOSIS — Z923 Personal history of irradiation: Secondary | ICD-10-CM

## 2015-04-17 DIAGNOSIS — Z79899 Other long term (current) drug therapy: Secondary | ICD-10-CM

## 2015-04-17 DIAGNOSIS — G4733 Obstructive sleep apnea (adult) (pediatric): Secondary | ICD-10-CM | POA: Diagnosis present

## 2015-04-17 DIAGNOSIS — Z9221 Personal history of antineoplastic chemotherapy: Secondary | ICD-10-CM

## 2015-04-17 DIAGNOSIS — Z87891 Personal history of nicotine dependence: Secondary | ICD-10-CM

## 2015-04-17 DIAGNOSIS — Z841 Family history of disorders of kidney and ureter: Secondary | ICD-10-CM

## 2015-04-17 DIAGNOSIS — Z833 Family history of diabetes mellitus: Secondary | ICD-10-CM

## 2015-04-17 DIAGNOSIS — F419 Anxiety disorder, unspecified: Secondary | ICD-10-CM | POA: Diagnosis present

## 2015-04-17 DIAGNOSIS — J449 Chronic obstructive pulmonary disease, unspecified: Secondary | ICD-10-CM | POA: Diagnosis present

## 2015-04-17 DIAGNOSIS — Z823 Family history of stroke: Secondary | ICD-10-CM

## 2015-04-17 DIAGNOSIS — D175 Benign lipomatous neoplasm of intra-abdominal organs: Secondary | ICD-10-CM | POA: Diagnosis present

## 2015-04-17 LAB — URINALYSIS COMPLETE WITH MICROSCOPIC (ARMC ONLY)
BILIRUBIN URINE: NEGATIVE
Glucose, UA: NEGATIVE mg/dL
KETONES UR: NEGATIVE mg/dL
NITRITE: NEGATIVE
PH: 5 (ref 5.0–8.0)
Protein, ur: NEGATIVE mg/dL
SPECIFIC GRAVITY, URINE: 1.018 (ref 1.005–1.030)

## 2015-04-17 LAB — COMPREHENSIVE METABOLIC PANEL
ALBUMIN: 1.9 g/dL — AB (ref 3.5–5.0)
ALK PHOS: 131 U/L — AB (ref 38–126)
ALT: 12 U/L — ABNORMAL LOW (ref 14–54)
ANION GAP: 5 (ref 5–15)
AST: 34 U/L (ref 15–41)
BUN: 11 mg/dL (ref 6–20)
CO2: 26 mmol/L (ref 22–32)
Calcium: 7.9 mg/dL — ABNORMAL LOW (ref 8.9–10.3)
Chloride: 102 mmol/L (ref 101–111)
Creatinine, Ser: 0.72 mg/dL (ref 0.44–1.00)
GFR calc Af Amer: >60 mL/min (ref >60)
GFR calc non Af Amer: >60 mL/min (ref >60)
GLUCOSE: 85 mg/dL (ref 65–99)
POTASSIUM: 3.9 mmol/L (ref 3.5–5.1)
SODIUM: 133 mmol/L — AB (ref 135–145)
Total Bilirubin: 0.5 mg/dL (ref 0.3–1.2)
Total Protein: 5.6 g/dL — ABNORMAL LOW (ref 6.5–8.1)

## 2015-04-17 LAB — PH, BODY FLUID: PH, BODY FLUID: 7.9

## 2015-04-17 LAB — PROTIME-INR
INR: 1.12
Prothrombin Time: 14.6 seconds (ref 11.4–15.0)

## 2015-04-17 LAB — TROPONIN I: Troponin I: <0.03 ng/mL (ref <0.031)

## 2015-04-17 LAB — LIPASE, BLOOD: Lipase: 13 U/L (ref 11–51)

## 2015-04-17 MED ORDER — OXYCODONE-ACETAMINOPHEN 5-325 MG PO TABS: 1.0000 | ORAL_TABLET | Freq: Three times a day (TID) | ORAL | Status: DC | PRN

## 2015-04-17 MED ORDER — FERROUS SULFATE 325 (65 FE) MG PO TABS: 325.0000 mg | ORAL_TABLET | Freq: Two times a day (BID) | ORAL | Status: AC

## 2015-04-17 MED ORDER — ONDANSETRON HCL 4 MG/2ML IJ SOLN
4.0000 mg | Freq: Once | INTRAMUSCULAR | Status: AC
  Administered 2015-04-17: 4 mg via INTRAVENOUS

## 2015-04-17 MED ORDER — ONDANSETRON HCL 4 MG/2ML IJ SOLN
INTRAMUSCULAR | Status: AC
Start: 2015-04-17 — End: 2015-04-17
  Administered 2015-04-17: 4 mg via INTRAVENOUS
  Filled 2015-04-17: qty 2

## 2015-04-17 MED ORDER — MORPHINE SULFATE (PF) 4 MG/ML IV SOLN
4.0000 mg | Freq: Once | INTRAVENOUS | Status: AC
  Administered 2015-04-17: 4 mg via INTRAVENOUS

## 2015-04-17 MED ORDER — ASPIRIN 81 MG PO TBEC: 81.0000 mg | DELAYED_RELEASE_TABLET | Freq: Every day | ORAL | Status: AC

## 2015-04-17 MED ORDER — MORPHINE SULFATE (PF) 4 MG/ML IV SOLN
INTRAVENOUS | Status: AC
  Administered 2015-04-17: 4 mg via INTRAVENOUS
  Filled 2015-04-17: qty 1

## 2015-04-17 NOTE — ED Notes (Signed)
Changed pt's colostomy.  Copious amounts of black stool noted with several large blood-clot appearing places.  After cleaning, no further evidence of bleeding noted.  Stoma pink/red and clean at this time.  No excoration noted.

## 2015-04-17 NOTE — ED Notes (Signed)
Pt states she noticed black blood in colostomy starting at 8pm.  Pt states this has happened before approx 6 months ago.  Pt recently d/c from [facility] and recently dx with lung cancer.  Colostomy has dark dried blood and black stool noted in colostomy at this time.

## 2015-04-17 NOTE — Progress Notes (Signed)
UPDATE: Patient will not be receiving [facility] home health services, she will instead be opened to [facility]. CMRN Hassan Rowan and Staff RN Stanton Kidney made aware, patient notified via phone call from staff RN Earlie Server. Flo Shanks RN, BSN, [facility] Liaison

## 2015-04-17 NOTE — Progress Notes (Signed)
Physical Therapy Treatment Patient Details Name: [name] MRN: [id number] DOB: 12-13-1955 Today's Date: 04/17/2015    History of Present Illness Pt is a 60 y.o. female with PMH of cancer, COPD, bipolar disorder, PTSD, UE DVT, and a colostomy (05-26-14).  Pt presented with SOB and right pleural effusion.  Pt was admitted for COPD exacerbation.  Pt is s/p thoracoscopy with chest tube placement (04-10-15)    PT Comments    Pt performed stand exercises requiring seated rest between exercises. Pt becomes short of breath requiring cues for pursed lip breathing. Pt tends to mouth breath. Pt fatigues quickly. O2 saturation on 3 liters O2 between 92% with stand and 97% with rest. Pt becomes nauseated and weak feeling post stand exercises. BP 127/54, heart rate 90 beats per minute and O2 saturation 96%. Pt returned to reclined seated position with improving symptoms. At time of this document, pt has discharge orders to skilled nursing facility to continue progression of strength, endurance and all functional mobility with safe O2 saturation levels.   Follow Up Recommendations  SNF     Equipment Recommendations  Rolling walker with 5" wheels    Recommendations for Other Services       Precautions / Restrictions Restrictions Weight Bearing Restrictions: No    Mobility  Bed Mobility               General bed mobility comments: Not tested up in chair  Transfers Overall transfer level: Needs assistance Equipment used: Rolling walker (2 wheeled) Transfers: Sit to/from Stand Sit to Stand: Supervision         General transfer comment: Slow to rise/sit  Ambulation/Gait             General Gait Details: unable post stand exercises   Stairs            Wheelchair Mobility    Modified Rankin (Stroke Patients Only)       Balance           Standing balance support: Bilateral upper extremity supported Standing balance-Leahy Scale: Good                      Cognition Arousal/Alertness: Awake/alert Behavior During Therapy: WFL for tasks assessed/performed Overall Cognitive Status: Within Functional Limits for tasks assessed                      Exercises General Exercises - Lower Extremity Hip ABduction/ADduction: Strengthening;Both;10 reps;Standing Straight Leg Raises: Strengthening;Both;10 reps;Standing Hip Flexion/Marching: AROM;Both;15 reps;Standing Other Exercises Other Exercises: B hip extension 10x    General Comments        Pertinent Vitals/Pain Pain Assessment: 0-10 Pain Score: 7  Pain Location: L upper back/abdomen Pain Descriptors / Indicators: Sharp Pain Intervention(s): Limited activity within patient's tolerance;Repositioned    Home Living                      Prior Function            PT Goals (current goals can now be found in the care plan section) Progress towards PT goals: Progressing toward goals    Frequency  Min 2X/week    PT Plan Current plan remains appropriate    Co-evaluation             End of Session Equipment Utilized During Treatment: Oxygen Activity Tolerance: Patient limited by fatigue (nausea. Post stand exercises BP 127/54) Patient left: in chair;with call bell/phone within reach;with  chair alarm set (reclined position)     Time: 4332-9518 PT Time Calculation (min) (ACUTE ONLY): 20 min  Charges:  $Therapeutic Exercise: 8-22 mins                    G Codes:      Charlaine Dalton, PTA 04/17/2015, 11:32 AM

## 2015-04-17 NOTE — Discharge Instructions (Signed)
Heart healthy diet. Activity as tolerated. HHPT. Home O2 [place] 2 L Fall precaution.

## 2015-04-17 NOTE — Plan of Care (Signed)
Pt d/ced home w/home health.  Pt qualified for home O2.  She was 86% on RA at rest and we walked to nurse's stn and she desated to 76% and was on 3L.  She walked further than yesterday when she just got up to the chair. She c/o of pain of 10 on L side - opposite of where chest tube was. Pain relieved w/pain med 1x.  Dressing where chest tube changed - a lot of drainage where there are 2 open wounds.  Cleaned and used vaseline gauze, regular gauze and tegaderm.  PICC line removed - was intact. Nursing student reviewed d/c instructions.  Pt going home w/family.

## 2015-04-17 NOTE — Discharge Summary (Signed)
[facility] [place] NAME: [name]    MR#:  [id number]  DATE OF BIRTH:  [birth year]  DATE OF ADMISSION:  04/08/2015 ADMITTING PHYSICIAN: Idelle Crouch, MD  DATE OF DISCHARGE: 04/17/2015 PRIMARY CARE PHYSICIAN: [name], NP    ADMISSION DIAGNOSIS:  SOB (shortness of breath) [R06.02] Pleural effusion [J90]   DISCHARGE DIAGNOSIS:  Recurrent right pleural effusion: due to malignant pleural effusion.  SECONDARY DIAGNOSIS:   Past Medical History  Diagnosis Date  . Schizophrenia ([place])   . Asthma   . GERD (gastroesophageal reflux disease)   . Anxiety   . Depression   . Bipolar disorder ([place])   . COPD (chronic obstructive pulmonary disease) ([place])   . Occasional tremors     right hand  . PTSD (post-traumatic stress disorder)   . Shortness of breath dyspnea   . Fibromyalgia   . DVT (deep venous thrombosis) ([place]) 2011    RUE  . Thyroid nodule   . DDD (degenerative disc disease), lumbar   . Spinal stenosis   . Peripheral neuropathy ([place])   . Rotator cuff tear     right  . Pneumonia 2011  . Hypothyroidism     no meds currently  . Anemia     during pregnancy only  . Hypertension     Off meds x 15 years-well controlled now per pt  . Squamous cell cancer, anus (H[facility])   . Severe obstructive sleep apnea 06/27/2014  . DVT of upper extremity (deep vein thrombosis) ([place]) 10/14/2014    HOSPITAL COURSE:   60 y.o. female has a past medical history significant for COPD and bipolar D/O recently admitted for COPD exacerbation now with progressive SOB and enlarging right pleural effusion.  1. Recurrent right pleural effusion: due to malignant pleural effusion. She is POD #7 for thorascopic talc pleurodesis with pleural biopsy and chest tube placement by Dr. Genevive Bi . 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  Per Dr. Mike Gip, palliative chemotherapy with cisplatin and  5-FU after 1-2 weeks. Chest tube was removed by Dr. Genevive Bi yesterday.  * Hypoxia. She has been on O2 [place] 3 L, try to wean off but desat at 80s%. She needs home O2 [place].  2. COPD: No signs of exacerbation Continue inhalers, nebulizer   3. Bipolar disorder: Continue Abilify clonazepam, Cymbalta and Lexapro   4. Tobacco dependence: smoking cessation was counseled for 3 min.  Continue nicotine patch.  5. History of anal cancer: Patient currently has a colostomy. There has been discussion with her surgeon for reversal in the future. Constipation improved with MiraLAX, lactulose and Colace.   6. Neck pain: Resolved   7. Acute on chronic iron deficiency anemia: Patient has consented for blood transfusion. Hemoglobin increased to 9.1 after transfusion. Anemia panel ordered and it appears patient has a low iron and low iron saturation Started iron supplementation, daily, changed to twice a day. Hb 8.8.  Hyponatremia. Improved with NS iv. Hypokalemia. given KCl.  She need SNF placement but she declined. DISCHARGE CONDITIONS:   Stable now but has poor prognosis, discharge to home with HHPT.  CONSULTS OBTAINED:  Treatment Team:  Nestor Lewandowsky, MD Lequita Asal, MD  DRUG ALLERGIES:   Allergies  Allergen Reactions  . Fish Allergy Anaphylaxis    Patient allergic to perch only. She can eat other fish.  . Sulfa Antibiotics Hives    DISCHARGE MEDICATIONS:   Current Discharge Medication  List    START taking these medications   Details  aspirin EC 81 MG EC tablet Take 1 tablet (81 mg total) by mouth daily. Qty: 30 tablet, Refills: 1    ferrous sulfate 325 (65 FE) MG tablet Take 1 tablet (325 mg total) by mouth 2 (two) times daily with a meal. Qty: 60 tablet, Refills: 0    oxyCODONE-acetaminophen (PERCOCET/ROXICET) 5-325 MG tablet Take 1 tablet by mouth every 8 (eight) hours as needed for moderate pain. Qty: 15 tablet, Refills: 0      CONTINUE these medications which have NOT  CHANGED   Details  albuterol (PROVENTIL HFA;VENTOLIN HFA) 108 (90 BASE) MCG/ACT inhaler Inhale 2 puffs into the lungs every 6 (six) hours as needed for wheezing or shortness of breath.     albuterol (PROVENTIL) (2.5 MG/3ML) 0.083% nebulizer solution Take 3 mLs (2.5 mg total) by nebulization every 4 (four) hours. Qty: 360 vial, Refills: 12    ARIPiprazole (ABILIFY) 2 MG tablet Take 1 tablet (2 mg total) by mouth daily. Qty: 30 tablet, Refills: 4    baclofen (LIORESAL) 10 MG tablet Take 1 tablet (10 mg total) by mouth 3 (three) times daily. Qty: 90 each, Refills: 6    budesonide-formoterol (SYMBICORT) 160-4.5 MCG/ACT inhaler Inhale 2 puffs into the lungs 2 (two) times daily. Qty: 1 Inhaler, Refills: 12    cholecalciferol (VITAMIN D) 1000 units tablet Take 1,000 Units by mouth daily.     clonazePAM (KLONOPIN) 1 MG tablet Take 1 tablet (1 mg total) by mouth 3 (three) times daily as needed for anxiety. Qty: 90 tablet, Refills: 4    DULoxetine (CYMBALTA) 60 MG capsule Take 60 mg by mouth every morning.     escitalopram (LEXAPRO) 20 MG tablet Take 1 tablet (20 mg total) by mouth daily. Qty: 30 tablet, Refills: 4    folic acid (FOLVITE) 1 MG tablet Take 1 tablet (1 mg total) by mouth daily. Qty: 30 tablet, Refills: 3    nicotine (NICODERM CQ - DOSED IN MG/24 HOURS) 21 mg/24hr patch Place 1 patch (21 mg total) onto the skin daily. Qty: 28 patch, Refills: 0    nystatin (MYCOSTATIN) 100000 UNIT/ML suspension Take 5 mLs (500,000 Units total) by mouth 4 (four) times daily. Qty: 60 mL, Refills: 2    pantoprazole (PROTONIX) 40 MG tablet Take 40 mg by mouth 2 (two) times daily.     polyethylene glycol (MIRALAX / GLYCOLAX) packet Take 17 g by mouth daily. Qty: 14 each, Refills: 0    senna-docusate (SENOKOT-S) 8.6-50 MG tablet Take 1 tablet by mouth at bedtime as needed for mild constipation. Qty: 30 tablet, Refills: 5    tiotropium (SPIRIVA) 18 MCG inhalation capsule Place 1 capsule (18 mcg  total) into inhaler and inhale daily. Qty: 30 capsule, Refills: 12    traZODone (DESYREL) 50 MG tablet Take 1 tablet (50 mg total) by mouth at bedtime as needed for sleep. Qty: 90 tablet, Refills: 1    zolpidem (AMBIEN) 10 MG tablet Take 1 tablet (10 mg total) by mouth at bedtime as needed for sleep. Qty: 30 tablet, Refills: 4    pregabalin (LYRICA) 200 MG capsule Take 1 capsule (200 mg total) by mouth 2 (two) times daily as needed. Qty: 90 capsule, Refills: 6      STOP taking these medications     oxyCODONE-acetaminophen (PERCOCET) 10-325 MG tablet          DISCHARGE INSTRUCTIONS:   If you experience worsening of your admission symptoms,  develop shortness of breath, life threatening emergency, suicidal or homicidal thoughts you must seek medical attention immediately by calling 911 or calling your MD immediately  if symptoms less severe.  You Must read complete instructions/literature along with all the possible adverse reactions/side effects for all the Medicines you take and that have been prescribed to you. Take any new Medicines after you have completely understood and accept all the possible adverse reactions/side effects.   Please note  You were cared for by a hospitalist during your hospital stay. If you have any questions about your discharge medications or the care you received while you were in the hospital after you are discharged, you can call the unit and asked to speak with the hospitalist on call if the hospitalist that took care of you is not available. Once you are discharged, your primary care physician will handle any further medical issues. Please note that NO REFILLS for any discharge medications will be authorized once you are discharged, as it is imperative that you return to your primary care physician (or establish a relationship with a primary care physician if you do not have one) for your aftercare needs so that they can reassess your need for medications and  monitor your lab values.    Today   SUBJECTIVE   Weakness. Feels better.   VITAL SIGNS:  Blood pressure 117/59, pulse 87, temperature 98 F (36.7 C), temperature source Oral, resp. rate 18, height 5' 6.5" (1.689 m), weight 97.977 kg (216 lb), SpO2 98 %.  I/O:   Intake/Output Summary (Last 24 hours) at 04/17/15 1424 Last data filed at 04/17/15 0802  Gross per 24 hour  Intake     10 ml  Output    475 ml  Net   -465 ml    PHYSICAL EXAMINATION:  GENERAL:  60 y.o.-year-old patient lying in the bed with no acute distress. obese. EYES: Pupils equal, round, reactive to light and accommodation. No scleral icterus. Extraocular muscles intact.  HEENT: Head atraumatic, normocephalic. Oropharynx and nasopharynx clear.  NECK:  Supple, no jugular venous distention. No thyroid enlargement, no tenderness.  LUNGS: Normal breath sounds bilaterally, no wheezing, mild crackles on right base. No use of accessory muscles of respiration.  CARDIOVASCULAR: S1, S2 normal. No murmurs, rubs, or gallops.  ABDOMEN: Soft, non-tender, non-distended. Bowel sounds present. No organomegaly or mass.  EXTREMITIES: No pedal edema, cyanosis, or clubbing.  NEUROLOGIC: Cranial nerves II through XII are intact. Muscle strength 4/5 in all extremities. Sensation intact. Gait not checked.  PSYCHIATRIC: The patient is alert and oriented x 3.  SKIN: No obvious rash, lesion, or ulcer.   DATA REVIEW:   CBC  Recent Labs Lab 04/16/15 0540  WBC 8.7  HGB 8.8*  HCT 26.6*  PLT 160    Chemistries   Recent Labs Lab 04/16/15 0540  NA 135  K 3.3*  CL 104  CO2 27  GLUCOSE 107*  BUN 13  CREATININE 0.79  CALCIUM 7.7*  MG 1.7    Cardiac Enzymes  Recent Labs Lab 04/15/15 0607  TROPONINI <0.03    Microbiology Results  Results for orders placed or performed during the hospital encounter of 03/22/15  Culture, body fluid-bottle     Status: None   Collection Time: 03/22/15  2:10 PM  Result Value Ref Range  Status   Specimen Description PLEURAL  Final   Special Requests NONE  Final   Gram Stain   Final    MODERATE RED BLOOD CELLS RARE WBC  SEEN NO ORGANISMS SEEN    Culture NO GROWTH 4 DAYS  Final   Report Status 03/26/2015 FINAL  Final   *Note: Due to a large number of results and/or encounters for the requested time period, some results have not been displayed. A complete set of results can be found in Results Review.    RADIOLOGY:  No results found.      Management plans discussed with the patient, family and they are in agreement.  CODE STATUS:     Code Status Orders        Start     Ordered   04/08/15 1402  Full code   Continuous     04/08/15 1401    Code Status History    Date Active Date Inactive Code Status Order ID Comments User Context   03/16/2015  1:41 AM 03/18/2015  6:23 PM Full Code [id number]  Lance Coon, MD ED   03/05/2015  6:17 AM 03/07/2015  8:09 PM Full Code [id number]  [name], MD Inpatient   02/26/2015  5:43 PM 03/03/2015  6:54 PM Full Code [id number]  Bettey Costa, MD Inpatient   10/14/2014 10:59 PM 10/17/2014  4:34 PM Full Code [id number]  Lytle Butte, MD ED   05/17/2014  8:38 PM 05/30/2014  4:55 PM Full Code [id number]  Fritzi Mandes, MD Inpatient    Advance Directive Documentation        Most Recent Value   Type of Advance Directive  Advance instruction for mental health treatment   Pre-existing out of facility DNR order (yellow form or pink MOST form)     "MOST" Form in Place?        TOTAL TIME TAKING CARE OF THIS PATIENT: 37 minutes.    Demetrios Loll M.D on 04/17/2015 at 2:24 PM  Between 7am to 6pm - Pager - [phone]  After 6pm go to [email or web address] - password [email or web address]  Office  [phone]  CC: Primary care physician; [name], NP

## 2015-04-17 NOTE — Clinical Social Work Note (Signed)
Pt is ready for discharge today. Pt declined SNF and will discharge home. RNCM following for home health services. CSW is signing off as no further needs identified.   Darden Dates, MSW, LCSW  Clinical Social Worker' [phone]

## 2015-04-17 NOTE — Progress Notes (Signed)
New referral for [facility] following discharge received from [facility]. Skilled nursing, physical therapy and aide services have been requested. Ms. Garramone was admitted to [facility] on 4/2 for evaluation of shortness of breath, she was found to have a pleural effusion, requiring placement of a chest tube. She is now requiring oxygen at 4 liters. She has a PMH of anal cancer with colostomy, now lung cancer. Plan is to start chemotherapy. Ms. Limbert lives with her friend Selena Batten and her husband Audry Pili, she has 2 daughters also involved in her care. She has a rolling walker at home, she will require home oxygen Plan is for discharge today after oxygen is delivered, patient plans to discharge via [facility] with [facility]. Thank you for the opportunity to be involved in the care of this patient. Flo Shanks RN, BSN, [facility] Liaison [phone] c

## 2015-04-17 NOTE — Care Management (Signed)
Refuses skilled nursing facility. Wants to go home with home health. Discussed home health agencies. Chose [facility]. Flo Shanks, RN representative for [facility] updated. May need home oxygen.  States family will transport. Discharge to home today per Dr. Bridgett Larsson. Shelbie Ammons RN MSN CCM Care Management [phone]

## 2015-04-17 NOTE — Progress Notes (Signed)
SATURATION QUALIFICATIONS: (This note is used to comply with regulatory documentation for home oxygen)  Patient Saturations on Room Air at Rest = 86%  Patient Saturations on Room Air while Ambulating = N/A  Patient Saturations on 3Liters of oxygen while Ambulating = 76%  Please briefly explain why patient needs home oxygen: Lung Cancer

## 2015-04-18 ENCOUNTER — Telehealth: Payer: Self-pay | Admitting: *Deleted

## 2015-04-18 ENCOUNTER — Telehealth: Payer: Self-pay | Admitting: Unknown Physician Specialty

## 2015-04-18 DIAGNOSIS — Z841 Family history of disorders of kidney and ureter: Secondary | ICD-10-CM | POA: Diagnosis not present

## 2015-04-18 DIAGNOSIS — K921 Melena: Secondary | ICD-10-CM | POA: Diagnosis present

## 2015-04-18 DIAGNOSIS — E785 Hyperlipidemia, unspecified: Secondary | ICD-10-CM | POA: Diagnosis present

## 2015-04-18 DIAGNOSIS — D175 Benign lipomatous neoplasm of intra-abdominal organs: Secondary | ICD-10-CM | POA: Diagnosis present

## 2015-04-18 DIAGNOSIS — Z87891 Personal history of nicotine dependence: Secondary | ICD-10-CM | POA: Diagnosis not present

## 2015-04-18 DIAGNOSIS — B961 Klebsiella pneumoniae [K. pneumoniae] as the cause of diseases classified elsewhere: Secondary | ICD-10-CM | POA: Diagnosis present

## 2015-04-18 DIAGNOSIS — M5136 Other intervertebral disc degeneration, lumbar region: Secondary | ICD-10-CM | POA: Diagnosis present

## 2015-04-18 DIAGNOSIS — Z86718 Personal history of other venous thrombosis and embolism: Secondary | ICD-10-CM | POA: Diagnosis not present

## 2015-04-18 DIAGNOSIS — Z85048 Personal history of other malignant neoplasm of rectum, rectosigmoid junction, and anus: Secondary | ICD-10-CM | POA: Diagnosis not present

## 2015-04-18 DIAGNOSIS — G4733 Obstructive sleep apnea (adult) (pediatric): Secondary | ICD-10-CM | POA: Diagnosis present

## 2015-04-18 DIAGNOSIS — N3000 Acute cystitis without hematuria: Secondary | ICD-10-CM | POA: Diagnosis present

## 2015-04-18 DIAGNOSIS — Z7951 Long term (current) use of inhaled steroids: Secondary | ICD-10-CM | POA: Diagnosis not present

## 2015-04-18 DIAGNOSIS — Z923 Personal history of irradiation: Secondary | ICD-10-CM | POA: Diagnosis not present

## 2015-04-18 DIAGNOSIS — J45909 Unspecified asthma, uncomplicated: Secondary | ICD-10-CM | POA: Diagnosis present

## 2015-04-18 DIAGNOSIS — Z9221 Personal history of antineoplastic chemotherapy: Secondary | ICD-10-CM | POA: Diagnosis not present

## 2015-04-18 DIAGNOSIS — C7800 Secondary malignant neoplasm of unspecified lung: Secondary | ICD-10-CM | POA: Diagnosis present

## 2015-04-18 DIAGNOSIS — Z91013 Allergy to seafood: Secondary | ICD-10-CM | POA: Diagnosis not present

## 2015-04-18 DIAGNOSIS — F419 Anxiety disorder, unspecified: Secondary | ICD-10-CM | POA: Diagnosis present

## 2015-04-18 DIAGNOSIS — E039 Hypothyroidism, unspecified: Secondary | ICD-10-CM | POA: Diagnosis present

## 2015-04-18 DIAGNOSIS — Z8249 Family history of ischemic heart disease and other diseases of the circulatory system: Secondary | ICD-10-CM | POA: Diagnosis not present

## 2015-04-18 DIAGNOSIS — K219 Gastro-esophageal reflux disease without esophagitis: Secondary | ICD-10-CM | POA: Diagnosis present

## 2015-04-18 DIAGNOSIS — Z7982 Long term (current) use of aspirin: Secondary | ICD-10-CM | POA: Diagnosis not present

## 2015-04-18 DIAGNOSIS — Z933 Colostomy status: Secondary | ICD-10-CM | POA: Diagnosis not present

## 2015-04-18 DIAGNOSIS — Z79899 Other long term (current) drug therapy: Secondary | ICD-10-CM | POA: Diagnosis not present

## 2015-04-18 DIAGNOSIS — M4855XS Collapsed vertebra, not elsewhere classified, thoracolumbar region, sequela of fracture: Secondary | ICD-10-CM | POA: Diagnosis present

## 2015-04-18 DIAGNOSIS — Z882 Allergy status to sulfonamides status: Secondary | ICD-10-CM | POA: Diagnosis not present

## 2015-04-18 DIAGNOSIS — J449 Chronic obstructive pulmonary disease, unspecified: Secondary | ICD-10-CM | POA: Diagnosis present

## 2015-04-18 DIAGNOSIS — M797 Fibromyalgia: Secondary | ICD-10-CM | POA: Diagnosis present

## 2015-04-18 DIAGNOSIS — Z833 Family history of diabetes mellitus: Secondary | ICD-10-CM | POA: Diagnosis not present

## 2015-04-18 DIAGNOSIS — Z823 Family history of stroke: Secondary | ICD-10-CM | POA: Diagnosis not present

## 2015-04-18 DIAGNOSIS — K5730 Diverticulosis of large intestine without perforation or abscess without bleeding: Secondary | ICD-10-CM | POA: Diagnosis present

## 2015-04-18 DIAGNOSIS — F329 Major depressive disorder, single episode, unspecified: Secondary | ICD-10-CM | POA: Diagnosis present

## 2015-04-18 LAB — CBC WITH DIFFERENTIAL/PLATELET
BASOS ABS: 0 10*3/uL (ref 0–0.1)
EOS ABS: 0.5 10*3/uL (ref 0–0.7)
Eosinophils Relative: 4 %
HCT: 28.9 % — ABNORMAL LOW (ref 35.0–47.0)
HEMOGLOBIN: 9.4 g/dL — AB (ref 12.0–16.0)
Lymphocytes Relative: 7 %
Lymphs Abs: 0.8 10*3/uL — ABNORMAL LOW (ref 1.0–3.6)
MCH: 28.4 pg (ref 26.0–34.0)
MCHC: 32.5 g/dL (ref 32.0–36.0)
MCV: 87.6 fL (ref 80.0–100.0)
Monocytes Absolute: 0.7 10*3/uL (ref 0.2–0.9)
NEUTROS ABS: 9.9 10*3/uL — AB (ref 1.4–6.5)
Platelets: 192 10*3/uL (ref 150–440)
RBC: 3.3 MIL/uL — ABNORMAL LOW (ref 3.80–5.20)
RDW: 17.1 % — AB (ref 11.5–14.5)
WBC: 12 10*3/uL — ABNORMAL HIGH (ref 3.6–11.0)

## 2015-04-18 LAB — HEMOGLOBIN AND HEMATOCRIT, BLOOD
HCT: 25.3 % — ABNORMAL LOW (ref 35.0–47.0)
HEMATOCRIT: 25.6 % — AB (ref 35.0–47.0)
HEMOGLOBIN: 8.2 g/dL — AB (ref 12.0–16.0)
Hemoglobin: 8.3 g/dL — ABNORMAL LOW (ref 12.0–16.0)

## 2015-04-18 LAB — HEMOGLOBIN A1C: Hgb A1c MFr Bld: 5.1 % (ref 4.0–6.0)

## 2015-04-18 LAB — TSH: TSH: 4.308 u[IU]/mL (ref 0.350–4.500)

## 2015-04-18 MED ORDER — DOCUSATE SODIUM 100 MG PO CAPS
100.0000 mg | ORAL_CAPSULE | Freq: Two times a day (BID) | ORAL | Status: DC
  Administered 2015-04-18 – 2015-04-21 (×5): 100 mg via ORAL
  Filled 2015-04-18 (×6): qty 1

## 2015-04-18 MED ORDER — CIPROFLOXACIN IN D5W 400 MG/200ML IV SOLN
400.0000 mg | Freq: Two times a day (BID) | INTRAVENOUS | Status: DC
  Administered 2015-04-18 – 2015-04-20 (×5): 400 mg via INTRAVENOUS
  Filled 2015-04-18 (×6): qty 200

## 2015-04-18 MED ORDER — VITAMIN D 1000 UNITS PO TABS
1000.0000 [IU] | ORAL_TABLET | Freq: Every day | ORAL | Status: DC
  Administered 2015-04-18 – 2015-04-21 (×4): 1000 [IU] via ORAL
  Filled 2015-04-18 (×4): qty 1

## 2015-04-18 MED ORDER — METRONIDAZOLE IN NACL 5-0.79 MG/ML-% IV SOLN
500.0000 mg | Freq: Three times a day (TID) | INTRAVENOUS | Status: DC
  Administered 2015-04-18 – 2015-04-20 (×8): 500 mg via INTRAVENOUS
  Filled 2015-04-18 (×10): qty 100

## 2015-04-18 MED ORDER — FOLIC ACID 1 MG PO TABS
1.0000 mg | ORAL_TABLET | Freq: Every day | ORAL | Status: DC
  Administered 2015-04-18 – 2015-04-21 (×4): 1 mg via ORAL
  Filled 2015-04-18 (×4): qty 1

## 2015-04-18 MED ORDER — TIOTROPIUM BROMIDE MONOHYDRATE 18 MCG IN CAPS
18.0000 ug | ORAL_CAPSULE | Freq: Every day | RESPIRATORY_TRACT | Status: DC
  Administered 2015-04-18 – 2015-04-21 (×4): 18 ug via RESPIRATORY_TRACT
  Filled 2015-04-18: qty 5

## 2015-04-18 MED ORDER — MORPHINE SULFATE (PF) 2 MG/ML IV SOLN: 2.0000 mg | INTRAVENOUS | Status: DC | PRN

## 2015-04-18 MED ORDER — ONDANSETRON HCL 4 MG PO TABS: 4.0000 mg | ORAL_TABLET | Freq: Four times a day (QID) | ORAL | Status: DC | PRN

## 2015-04-18 MED ORDER — SENNOSIDES-DOCUSATE SODIUM 8.6-50 MG PO TABS: 1.0000 | ORAL_TABLET | Freq: Every evening | ORAL | Status: DC | PRN

## 2015-04-18 MED ORDER — DULOXETINE HCL 60 MG PO CPEP
60.0000 mg | ORAL_CAPSULE | ORAL | Status: DC
  Administered 2015-04-18 – 2015-04-21 (×4): 60 mg via ORAL
  Filled 2015-04-18 (×4): qty 1

## 2015-04-18 MED ORDER — DEXTROSE-NACL 5-0.9 % IV SOLN
INTRAVENOUS | Status: DC
  Administered 2015-04-18 – 2015-04-21 (×7): via INTRAVENOUS

## 2015-04-18 MED ORDER — OXYCODONE-ACETAMINOPHEN 5-325 MG PO TABS
1.0000 | ORAL_TABLET | Freq: Three times a day (TID) | ORAL | Status: DC | PRN
  Administered 2015-04-18 – 2015-04-21 (×3): 1 via ORAL
  Filled 2015-04-18 (×3): qty 1

## 2015-04-18 MED ORDER — ONDANSETRON HCL 4 MG/2ML IJ SOLN: 4.0000 mg | Freq: Four times a day (QID) | INTRAMUSCULAR | Status: DC | PRN

## 2015-04-18 MED ORDER — ZOLPIDEM TARTRATE 5 MG PO TABS: 10.0000 mg | ORAL_TABLET | Freq: Every evening | ORAL | Status: DC | PRN

## 2015-04-18 MED ORDER — POLYETHYLENE GLYCOL 3350 17 G PO PACK
17.0000 g | PACK | Freq: Every day | ORAL | Status: DC
  Administered 2015-04-20: 09:00:00 17 g via ORAL
  Filled 2015-04-18 (×4): qty 1

## 2015-04-18 MED ORDER — MOMETASONE FURO-FORMOTEROL FUM 200-5 MCG/ACT IN AERO
2.0000 | INHALATION_SPRAY | Freq: Two times a day (BID) | RESPIRATORY_TRACT | Status: DC
  Administered 2015-04-18 – 2015-04-21 (×7): 2 via RESPIRATORY_TRACT
  Filled 2015-04-18: qty 8.8

## 2015-04-18 MED ORDER — ARIPIPRAZOLE 2 MG PO TABS
2.0000 mg | ORAL_TABLET | Freq: Every day | ORAL | Status: DC
  Administered 2015-04-18 – 2015-04-21 (×4): 2 mg via ORAL
  Filled 2015-04-18 (×4): qty 1

## 2015-04-18 MED ORDER — ASPIRIN EC 81 MG PO TBEC
81.0000 mg | DELAYED_RELEASE_TABLET | Freq: Every day | ORAL | Status: DC
  Administered 2015-04-18: 08:00:00 81 mg via ORAL
  Filled 2015-04-18: qty 1

## 2015-04-18 MED ORDER — ESCITALOPRAM OXALATE 10 MG PO TABS
20.0000 mg | ORAL_TABLET | Freq: Every day | ORAL | Status: DC
  Administered 2015-04-18 – 2015-04-21 (×4): 20 mg via ORAL
  Filled 2015-04-18 (×4): qty 2
  Filled 2015-04-18: qty 1

## 2015-04-18 MED ORDER — ACETAMINOPHEN 325 MG PO TABS: 650.0000 mg | ORAL_TABLET | Freq: Four times a day (QID) | ORAL | Status: DC | PRN

## 2015-04-18 MED ORDER — PANTOPRAZOLE SODIUM 40 MG IV SOLR
40.0000 mg | Freq: Two times a day (BID) | INTRAVENOUS | Status: DC
  Administered 2015-04-18 – 2015-04-20 (×6): 40 mg via INTRAVENOUS
  Filled 2015-04-18 (×6): qty 40

## 2015-04-18 MED ORDER — ALBUTEROL SULFATE (2.5 MG/3ML) 0.083% IN NEBU: 2.5000 mg | INHALATION_SOLUTION | RESPIRATORY_TRACT | Status: DC | PRN

## 2015-04-18 MED ORDER — NICOTINE 21 MG/24HR TD PT24
21.0000 mg | MEDICATED_PATCH | Freq: Every day | TRANSDERMAL | Status: DC
  Administered 2015-04-18 – 2015-04-21 (×4): 21 mg via TRANSDERMAL
  Filled 2015-04-18 (×4): qty 1

## 2015-04-18 MED ORDER — CLONAZEPAM 1 MG PO TABS: 1.0000 mg | ORAL_TABLET | Freq: Three times a day (TID) | ORAL | Status: DC | PRN

## 2015-04-18 MED ORDER — DEXTROSE 5 % IV SOLN: 1.0000 g | INTRAVENOUS | Status: DC

## 2015-04-18 MED ORDER — TRAZODONE HCL 50 MG PO TABS: 50.0000 mg | ORAL_TABLET | Freq: Every evening | ORAL | Status: DC | PRN

## 2015-04-18 MED ORDER — MORPHINE SULFATE (PF) 4 MG/ML IV SOLN
4.0000 mg | INTRAVENOUS | Status: DC | PRN
  Administered 2015-04-18 (×2): 4 mg via INTRAVENOUS
  Administered 2015-04-18: 2 mg via INTRAVENOUS
  Administered 2015-04-18 – 2015-04-21 (×10): 4 mg via INTRAVENOUS
  Filled 2015-04-18 (×13): qty 1

## 2015-04-18 MED ORDER — POLYETHYLENE GLYCOL 3350 17 GM/SCOOP PO POWD
1.0000 | Freq: Once | ORAL | Status: AC
  Administered 2015-04-18: 20:00:00 255 g via ORAL
  Filled 2015-04-18: qty 255

## 2015-04-18 MED ORDER — FERROUS SULFATE 325 (65 FE) MG PO TABS
325.0000 mg | ORAL_TABLET | Freq: Two times a day (BID) | ORAL | Status: DC
  Administered 2015-04-18 – 2015-04-21 (×8): 325 mg via ORAL
  Filled 2015-04-18 (×8): qty 1

## 2015-04-18 MED ORDER — SODIUM CHLORIDE 0.9% FLUSH
3.0000 mL | Freq: Two times a day (BID) | INTRAVENOUS | Status: DC
  Administered 2015-04-18 – 2015-04-20 (×5): 3 mL via INTRAVENOUS

## 2015-04-18 MED ORDER — PREGABALIN 75 MG PO CAPS: 200.0000 mg | ORAL_CAPSULE | Freq: Two times a day (BID) | ORAL | Status: DC | PRN

## 2015-04-18 MED ORDER — ACETAMINOPHEN 650 MG RE SUPP
650.0000 mg | Freq: Four times a day (QID) | RECTAL | Status: DC | PRN
Start: 2015-04-18 — End: 2015-04-21

## 2015-04-18 MED ORDER — NYSTATIN 100000 UNIT/ML MT SUSP
5.0000 mL | Freq: Four times a day (QID) | OROMUCOSAL | Status: DC
  Administered 2015-04-18 – 2015-04-20 (×5): 500000 [IU] via ORAL
  Filled 2015-04-18 (×10): qty 5

## 2015-04-18 MED ORDER — BACLOFEN 10 MG PO TABS
10.0000 mg | ORAL_TABLET | Freq: Three times a day (TID) | ORAL | Status: DC
  Administered 2015-04-18 – 2015-04-20 (×8): 10 mg via ORAL
  Filled 2015-04-18 (×10): qty 1

## 2015-04-18 NOTE — H&P (Signed)
[name] is an 60 y.o. female.   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 She was discharged from the hospital approximately 6 hours prior to presentation. She took a nap once she got home but awoke to find blood all over her abdomen and bright red blood in her colostomy. At the emergency department there was bright red blood as well as large clots in her colostomy bag. The patient complains of some mild diffuse abdominal pain as well as left shoulder pain. She denies shortness of breath or chest pain. Due to her gastrointestinal bleeding the emergency department staff called for admission.  Past Medical History  Diagnosis Date  . Schizophrenia ([place])   . Asthma   . GERD (gastroesophageal reflux disease)   . Anxiety   . Depression   . Bipolar disorder ([place])   . COPD (chronic obstructive pulmonary disease) ([place])   . Occasional tremors     right hand  . PTSD (post-traumatic stress disorder)   . Shortness of breath dyspnea   . Fibromyalgia   . DVT (deep venous thrombosis) ([place]) 2011    RUE  . Thyroid nodule   . DDD (degenerative disc disease), lumbar   . Spinal stenosis   . Peripheral neuropathy ([place])   . Rotator cuff tear     right  . Pneumonia 2011  . Hypothyroidism     no meds currently  . Anemia     during pregnancy only  . Hypertension     Off meds x 15 years-well controlled now per pt  . Squamous cell cancer, anus ([facility])   . Severe obstructive sleep apnea 06/27/2014  . DVT of upper extremity (deep vein thrombosis) ([place]) 10/14/2014    Past Surgical History  Procedure Laterality Date  . Foot surgery Right   . Tubal ligation    . Eye surgery Bilateral   . Mouth surgery  2002  . Rectal biopsy N/A  05/08/2014    Procedure: BIOPSY RECTAL;  Surgeon: Marlyce Huge, MD;  Location: [facility] ORS;  Service: General;  Laterality: N/A;  . Evaluation under anesthesia with hemorrhoidectomy N/A 05/08/2014    Procedure: EXAM UNDER ANESTHESIA WITH HEMORRHOIDECTOMY;  Surgeon: Marlyce Huge, MD;  Location: [facility] ORS;  Service: General;  Laterality: N/A;  . Portacath placement N/A 05/20/2014    Procedure: INSERTION PORT-A-CATH;  Surgeon: Florene Glen, MD;  Location: [facility] ORS;  Service: General;  Laterality: N/A;  . Laparoscopic diverted colostomy N/A 05/26/2014    Procedure: LAPAROSCOPIC DIVERTED COLOSTOMY;  Surgeon: Marlyce Huge, MD;  Location: [facility] ORS;  Service: General;  Laterality: N/A;  . Peripheral vascular catheterization Left 10/16/2014    Procedure: Upper Extremity Venography with thrombectomy, port removal;  Surgeon: Algernon Huxley, MD;  Location: [facility];  Service: Cardiovascular;  Laterality: Left;  . Peripheral vascular catheterization  10/16/2014    Procedure: Upper Extremity Intervention;  Surgeon: Algernon Huxley, MD;  Location: [facility];  Service: Cardiovascular;;  . Video assisted thoracoscopy (vats)/thorocotomy Right 04/10/2015    Procedure: PRE-OP BRONCHOSCOPY, VIDEO ASSISTED THORACOSCOPY RIGHT WITH PLEURAL BIOPSIES, & PLEURAL DESIS;  Surgeon: Nestor Lewandowsky, MD;  Location: [facility];  Service: Thoracic;  Laterality: Right;    Family History  Problem Relation Age of Onset  . Cancer Maternal  Aunt   . Cancer Paternal Uncle   . Diabetes Mother   . Thyroid disease Mother   . Kidney failure Mother   . Hypertension Mother   . Depression Mother   . Mental illness Son   . Aneurysm Maternal Grandmother   . Thyroid disease Maternal Grandmother   . Stroke Maternal Grandfather   . Hypertension Maternal Grandfather   . Diabetes Maternal Grandfather   . Heart disease Maternal Grandfather     MI  . Nephrolithiasis Daughter    Social History:  reports that  she has quit smoking. Her smoking use included Cigarettes. She started smoking about 44 years ago. She has a 4.4 pack-year smoking history. She has never used smokeless tobacco. She reports that she does not drink alcohol or use illicit drugs.  Allergies:  Allergies  Allergen Reactions  . Fish Allergy Anaphylaxis    Patient allergic to perch only. She can eat other fish.  . Sulfa Antibiotics Hives    Medications Prior to Admission  Medication Sig Dispense Refill  . albuterol (PROVENTIL HFA;VENTOLIN HFA) 108 (90 BASE) MCG/ACT inhaler Inhale 2 puffs into the lungs every 6 (six) hours as needed for wheezing or shortness of breath.     Marland Kitchen albuterol (PROVENTIL) (2.5 MG/3ML) 0.083% nebulizer solution Take 3 mLs (2.5 mg total) by nebulization every 4 (four) hours. (Patient taking differently: Take 2.5 mg by nebulization every 4 (four) hours as needed for wheezing or shortness of breath. ) 360 vial 12  . ARIPiprazole (ABILIFY) 2 MG tablet Take 1 tablet (2 mg total) by mouth daily. 30 tablet 4  . aspirin EC 81 MG EC tablet Take 1 tablet (81 mg total) by mouth daily. 30 tablet 1  . baclofen (LIORESAL) 10 MG tablet Take 1 tablet (10 mg total) by mouth 3 (three) times daily. (Patient taking differently: Take 10 mg by mouth 3 (three) times daily as needed for muscle spasms. ) 90 each 6  . budesonide-formoterol (SYMBICORT) 160-4.5 MCG/ACT inhaler Inhale 2 puffs into the lungs 2 (two) times daily. 1 Inhaler 12  . cholecalciferol (VITAMIN D) 1000 units tablet Take 1,000 Units by mouth daily.     . clonazePAM (KLONOPIN) 1 MG tablet Take 1 tablet (1 mg total) by mouth 3 (three) times daily as needed for anxiety. 90 tablet 4  . DULoxetine (CYMBALTA) 60 MG capsule Take 60 mg by mouth every morning.     . escitalopram (LEXAPRO) 20 MG tablet Take 1 tablet (20 mg total) by mouth daily. 30 tablet 4  . ferrous sulfate 325 (65 FE) MG tablet Take 1 tablet (325 mg total) by mouth 2 (two) times daily with a meal. 60 tablet  0  . folic acid (FOLVITE) 1 MG tablet Take 1 tablet (1 mg total) by mouth daily. 30 tablet 3  . nicotine (NICODERM CQ - DOSED IN MG/24 HOURS) 21 mg/24hr patch Place 1 patch (21 mg total) onto the skin daily. 28 patch 0  . nystatin (MYCOSTATIN) 100000 UNIT/ML suspension Take 5 mLs (500,000 Units total) by mouth 4 (four) times daily. 60 mL 2  . oxyCODONE-acetaminophen (PERCOCET/ROXICET) 5-325 MG tablet Take 1 tablet by mouth every 8 (eight) hours as needed for moderate pain. 15 tablet 0  . pantoprazole (PROTONIX) 40 MG tablet Take 40 mg by mouth 2 (two) times daily.     . polyethylene glycol (MIRALAX / GLYCOLAX) packet Take 17 g by mouth daily. 14 each 0  . pregabalin (LYRICA) 200 MG capsule Take 1 capsule (  200 mg total) by mouth 2 (two) times daily as needed. (Patient taking differently: Take 200 mg by mouth 2 (two) times daily as needed (pain). ) 90 capsule 6  . senna-docusate (SENOKOT-S) 8.6-50 MG tablet Take 1 tablet by mouth at bedtime as needed for mild constipation. 30 tablet 5  . tiotropium (SPIRIVA) 18 MCG inhalation capsule Place 1 capsule (18 mcg total) into inhaler and inhale daily. 30 capsule 12  . traZODone (DESYREL) 50 MG tablet Take 1 tablet (50 mg total) by mouth at bedtime as needed for sleep. 90 tablet 1  . zolpidem (AMBIEN) 10 MG tablet Take 1 tablet (10 mg total) by mouth at bedtime as needed for sleep. 30 tablet 4    Results for orders placed or performed during the hospital encounter of 04/17/15 (from the past 48 hour(s))  CBC with Differential     Status: Abnormal   Collection Time: 04/17/15 10:26 PM  Result Value Ref Range   WBC 12.0 (H) 3.6 - 11.0 K/uL   RBC 3.30 (L) 3.80 - 5.20 MIL/uL   Hemoglobin 9.4 (L) 12.0 - 16.0 g/dL   HCT 28.9 (L) 35.0 - 47.0 %   MCV 87.6 80.0 - 100.0 fL   MCH 28.4 26.0 - 34.0 pg   MCHC 32.5 32.0 - 36.0 g/dL   RDW 17.1 (H) 11.5 - 14.5 %   Platelets 192 150 - 440 K/uL    Comment: PLATELET COUNT CONFIRMED BY SMEAR COUNT MAY BE INACCURATE DUE  TO FIBRIN CLUMPS.    Neutrophils Relative % 83% %   Neutro Abs 9.9 (H) 1.4 - 6.5 K/uL   Lymphocytes Relative 7% %   Lymphs Abs 0.8 (L) 1.0 - 3.6 K/uL   Monocytes Relative 6% %   Monocytes Absolute 0.7 0.2 - 0.9 K/uL   Eosinophils Relative 4% %   Eosinophils Absolute 0.5 0 - 0.7 K/uL   Basophils Relative 0% %   Basophils Absolute 0.0 0 - 0.1 K/uL  Comprehensive metabolic panel     Status: Abnormal   Collection Time: 04/17/15 10:26 PM  Result Value Ref Range   Sodium 133 (L) 135 - 145 mmol/L   Potassium 3.9 3.5 - 5.1 mmol/L   Chloride 102 101 - 111 mmol/L   CO2 26 22 - 32 mmol/L   Glucose, Bld 85 65 - 99 mg/dL   BUN 11 6 - 20 mg/dL   Creatinine, Ser 0.72 0.44 - 1.00 mg/dL   Calcium 7.9 (L) 8.9 - 10.3 mg/dL   Total Protein 5.6 (L) 6.5 - 8.1 g/dL   Albumin 1.9 (L) 3.5 - 5.0 g/dL   AST 34 15 - 41 U/L   ALT 12 (L) 14 - 54 U/L   Alkaline Phosphatase 131 (H) 38 - 126 U/L   Total Bilirubin 0.5 0.3 - 1.2 mg/dL   GFR calc non Af Amer >60 >60 mL/min   GFR calc Af Amer >60 >60 mL/min    Comment: (NOTE) The eGFR has been calculated using the CKD EPI equation. This calculation has not been validated in all clinical situations. eGFR's persistently <60 mL/min signify possible Chronic Kidney Disease.    Anion gap 5 5 - 15  Lipase, blood     Status: None   Collection Time: 04/17/15 10:26 PM  Result Value Ref Range   Lipase 13 11 - 51 U/L  Troponin I     Status: None   Collection Time: 04/17/15 10:26 PM  Result Value Ref Range   Troponin I <0.03 <  0.031 ng/mL    Comment:        NO INDICATION OF MYOCARDIAL INJURY.   Protime-INR     Status: None   Collection Time: 04/17/15 10:26 PM  Result Value Ref Range   Prothrombin Time 14.6 11.4 - 15.0 seconds   INR 1.12   TSH     Status: None   Collection Time: 04/17/15 10:26 PM  Result Value Ref Range   TSH 4.308 0.350 - 4.500 uIU/mL  Urinalysis complete, with microscopic     Status: Abnormal   Collection Time: 04/17/15 10:34 PM  Result  Value Ref Range   Color, Urine YELLOW (A) YELLOW   APPearance CLEAR (A) CLEAR   Glucose, UA NEGATIVE NEGATIVE mg/dL   Bilirubin Urine NEGATIVE NEGATIVE   Ketones, ur NEGATIVE NEGATIVE mg/dL   Specific Gravity, Urine 1.018 1.005 - 1.030   Hgb urine dipstick 2+ (A) NEGATIVE   pH 5.0 5.0 - 8.0   Protein, ur NEGATIVE NEGATIVE mg/dL   Nitrite NEGATIVE NEGATIVE   Leukocytes, UA TRACE (A) NEGATIVE   RBC / HPF 6-30 0 - 5 RBC/hpf   WBC, UA 6-30 0 - 5 WBC/hpf   Bacteria, UA RARE (A) NONE SEEN   Squamous Epithelial / LPF 0-5 (A) NONE SEEN   Mucous PRESENT    Hyaline Casts, UA PRESENT    *Note: Due to a large number of results and/or encounters for the requested time period, some results have not been displayed. A complete set of results can be found in Results Review.   Dg Chest 2 View  04/18/2015  CLINICAL DATA:  Dyspnea and left shoulder pain. Chest tube was removed yesterday. EXAM: CHEST  2 VIEW COMPARISON:  04/15/2015 FINDINGS: The right chest tube and right upper extremity PICC line have been removed. No pneumothorax is evident. Mild linear basilar opacities are present bilaterally, likely atelectatic. These are improved, with partial clearance since 04/15/2015. Pulmonary vasculature is normal. IMPRESSION: Chest tube removal. No pneumothorax. Mild atelectatic appearing basilar opacities. Electronically Signed   By: Andreas Newport M.D.   On: 04/18/2015 01:21    Review of Systems  Constitutional: Negative for fever and chills.  HENT: Negative for sore throat and tinnitus.   Eyes: Negative for blurred vision and redness.  Respiratory: Negative for cough and shortness of breath.   Cardiovascular: Negative for chest pain, palpitations, orthopnea and PND.  Gastrointestinal: Positive for blood in stool. Negative for nausea, vomiting, abdominal pain and diarrhea.  Genitourinary: Negative for dysuria, urgency and frequency.  Musculoskeletal: Positive for back pain (left shoulder). Negative for  myalgias and joint pain.  Skin: Negative for rash.       No lesions  Neurological: Negative for speech change, focal weakness and weakness.  Endo/Heme/Allergies: Does not bruise/bleed easily.       No temperature intolerance  Psychiatric/Behavioral: Negative for depression and suicidal ideas.    Blood pressure 114/62, pulse 87, temperature 98.4 F (36.9 C), temperature source Oral, resp. rate 18, height 5' 6.5" (1.689 m), weight 96.117 kg (211 lb 14.4 oz), SpO2 92 %. Physical Exam  Vitals reviewed. Constitutional: She is oriented to person, place, and time. She appears well-developed and well-nourished.  HENT:  Head: Normocephalic and atraumatic.  Mouth/Throat: Oropharynx is clear and moist.  Eyes: Conjunctivae and EOM are normal. Pupils are equal, round, and reactive to light. No scleral icterus.  Neck: Normal range of motion. Neck supple. No JVD present. No tracheal deviation present. No thyromegaly present.  Cardiovascular: Normal rate, regular  rhythm and normal heart sounds.  Exam reveals no gallop and no friction rub.   No murmur heard. Respiratory: Effort normal and breath sounds normal.  GI: Soft. Bowel sounds are normal. She exhibits no distension. There is no tenderness.  Colostomy in place  Genitourinary:  Deferred  Musculoskeletal: Normal range of motion. She exhibits no edema.  Lymphadenopathy:    She has no cervical adenopathy.  Neurological: She is alert and oriented to person, place, and time. No cranial nerve deficit. She exhibits normal muscle tone.  Skin: Skin is warm and dry. No rash noted. No erythema.  Psychiatric: She has a normal mood and affect. Her behavior is normal. Judgment and thought content normal.     Assessment/Plan This is a 60 year old female admitted for gastrointestinal bleeding. 1. GI bleed: Unclear origin though her bleeding is acute. She is hemodynamically stable. Will recheck hemoglobin and hematocrit. Gastroenterology consulted. The  patient is nothing by mouth except for medications. 2. Lung cancer: Metastatic squamous cell; status post pleurodesis. The patient has very mild chest wall pain at the site of her chest tube incision. Now she complains of some right upper quadrant pain. I suspect this is some diaphragmatic inflammatory pain. Continue oral analgesics. I have ordered IV narcotics for breakthrough pain. 3. Leukocytosis: It appears the patient may have a urinary tract infection but she may also have some inflammatory fluid inferior to the diaphragm or pericolic gutters. I started her on Cipro and Flagyl which will cover both UTI and intra-abdominal infection. 4. Hypothyroidism: Continue Synthroid 5. COPD: Continue Spiriva and inhaled corticosteroid 6. Depression: Continue Abilify 7. DVT prophylaxis: SCDs 8. GI prophylaxis: IV PPI The patient is a full code. Time spent on admission orders and patient care approximate 45 minutes  [name], MD 04/18/2015, 5:06 AM

## 2015-04-18 NOTE — Progress Notes (Signed)
[place] [facility] [place] NAME: [name]    MR#:  [id number]  DATE OF BIRTH:  07/04/1955  SUBJECTIVE:  CHIEF COMPLAINT:  Patient denies any blurred in her colostomy bag since admission. Reporting left-sided back pain. Recently had her right-sided chest tube removal for pneumothorax and wants to see her lung doctor Dr. Jamal Collin. Denies any vomiting patient was supposed to have EGD and colonoscopy in the last week but after taking colonoscopy preparation she started having vomiting and could not get the procedures none .  REVIEW OF SYSTEMS:  CONSTITUTIONAL: No fever, fatigue or weakness.  EYES: No blurred or double vision.  EARS, NOSE, AND THROAT: No tinnitus or ear pain.  RESPIRATORY: No cough, shortness of breath, wheezing or hemoptysis.  CARDIOVASCULAR: No chest pain, orthopnea, edema.  GASTROINTESTINAL: No nausea, vomiting, diarrhea or abdominal pain. colostomy bag  GENITOURINARY: No dysuria, hematuria.  ENDOCRINE: No polyuria, nocturia,  HEMATOLOGY: No anemia, easy bruising or bleeding SKIN: No rash or lesion. MUSCULOSKELETAL: No joint pain or arthritis.   NEUROLOGIC: No tingling, numbness, weakness.  PSYCHIATRY: No anxiety or depression.   DRUG ALLERGIES:   Allergies  Allergen Reactions  . Fish Allergy Anaphylaxis    Patient allergic to perch only. She can eat other fish.  . Sulfa Antibiotics Hives    VITALS:  Blood pressure 109/46, pulse 77, temperature 97.9 F (36.6 C), temperature source Oral, resp. rate 18, height 5' 6.5" (1.689 m), weight 96.117 kg (211 lb 14.4 oz), SpO2 97 %.  PHYSICAL EXAMINATION:  GENERAL:  60 y.o.-year-old patient lying in the bed with no acute distress.  EYES: Pupils equal, round, reactive to light and accommodation. No scleral icterus. Extraocular muscles intact.  HEENT: Head atraumatic, normocephalic. Oropharynx and nasopharynx clear.  NECK:  Supple, no jugular venous distention. No thyroid  enlargement, no tenderness.  LUNGS: Normal breath sounds bilaterally, no wheezing, rales,rhonchi or crepitation. No use of accessory muscles of respiration.  right side of the back with healing chest tube removal site.  CARDIOVASCULAR: S1, S2 normal. No murmurs, rubs, or gallops.  ABDOMEN: Soft, nontender, nondistended. colostomy bag is intact with no blood  Bowel sounds present. No organomegaly or mass. left flank is tender  EXTREMITIES: No pedal edema, cyanosis, or clubbing.  NEUROLOGIC: Cranial nerves II through XII are intact. Muscle strength 5/5 in all extremities. Sensation intact. Gait not checked.  PSYCHIATRIC: The patient is alert and oriented x 3.  SKIN: No obvious rash, lesion, or ulcer.    LABORATORY PANEL:   CBC  Recent Labs Lab 04/17/15 2226  WBC 12.0*  HGB 9.4*  HCT 28.9*  PLT 192   ------------------------------------------------------------------------------------------------------------------  Chemistries   Recent Labs Lab 04/16/15 0540 04/17/15 2226  NA 135 133*  K 3.3* 3.9  CL 104 102  CO2 27 26  GLUCOSE 107* 85  BUN 13 11  CREATININE 0.79 0.72  CALCIUM 7.7* 7.9*  MG 1.7  --   AST  --  34  ALT  --  12*  ALKPHOS  --  131*  BILITOT  --  0.5   ------------------------------------------------------------------------------------------------------------------  Cardiac Enzymes  Recent Labs Lab 04/17/15 2226  TROPONINI <0.03   ------------------------------------------------------------------------------------------------------------------  RADIOLOGY:  Dg Chest 2 View  04/18/2015  CLINICAL DATA:  Dyspnea and left shoulder pain. Chest tube was removed yesterday. EXAM: CHEST  2 VIEW COMPARISON:  04/15/2015 FINDINGS: The right chest tube and right upper extremity PICC line have been removed. No pneumothorax is evident. Mild  linear basilar opacities are present bilaterally, likely atelectatic. These are improved, with partial clearance since  04/15/2015. Pulmonary vasculature is normal. IMPRESSION: Chest tube removal. No pneumothorax. Mild atelectatic appearing basilar opacities. Electronically Signed   By: Andreas Newport M.D.   On: 04/18/2015 01:21    EKG:   Orders placed or performed during the hospital encounter of 04/17/15  . EKG 12-Lead  . EKG 12-Lead   *Note: Due to a large number of results and/or encounters for the requested time period, some results have not been displayed. A complete set of results can be found in Results Review.    ASSESSMENT AND PLAN:    This is a 60 year old female admitted for gastrointestinal bleeding. 1. GI bleed: Unclear origin though her bleeding is acute.  She is hemodynamically stable.  Monitor- hemoglobin and hematocrit.  Gastroenterology consulted. T he patient is nothing by mouth except for medications.  provide PPI with IV fluids and empiric antibiotics Cipro and Flagyl   2. Lung cancer: Metastatic squamous cell; status post pleurodesis . The patient has very mild chest wall pain at the site of her chest tube incision. Now she complains of some right upper quadrant pain. I suspect this is some diaphragmatic inflammatory pain. Continue oral analgesics. I have ordered IV narcotics for breakthrough pain. consult is placed to pulmonology Dr. Jamal Collin at patient's request  3. Leukocytosis: It appears the patient may have a urinary tract infection but she may also have some inflammatory fluid inferior to the diaphragm or pericolic gutters. I started her on Cipro and Flagyl which will cover both UTI and intra-abdominal infection. urine culture is ordered   4. Hypothyroidism: Continue Synthroid  5. COPD: Continue Spiriva and inhaled corticosteroid  6. Depression: Continue Abilify  7. DVT prophylaxis: SCDs  8. GI prophylaxis: IV PPI    All the records are reviewed and case discussed with Care Management/Social Workerr. Management plans discussed with the patient, family and they are  in agreement.  CODE STATUS: fc  TOTAL TIME TAKING CARE OF THIS PATIENT: 35  minutes.   POSSIBLE D/C IN 2-3  DAYS, DEPENDING ON CLINICAL CONDITION.   Nicholes Mango M.D on 04/18/2015 at 12:40 PM  Between 7am to 6pm - Pager - [phone] After 6pm go to [email or web address] - password [email or web address] [facility]  Office  [phone]  CC: Primary care physician; [name], NP

## 2015-04-18 NOTE — ED Provider Notes (Signed)
[facility] Emergency Department Provider Note  ____________________________________________  Time seen: 11:30 PM  I have reviewed the triage vital signs and the nursing notes.   HISTORY  Chief Complaint GI Bleeding      HPI [name] is a 60 y.o. female with history of rectal cancer recently diagnosed lung cancer presents to the emergency department after being discharged yesterday with "bleeding from her colostomy with large clots". Patient denies any abdominal pain or vomiting.    Past Medical History  Diagnosis Date  . Schizophrenia ([place])   . Asthma   . GERD (gastroesophageal reflux disease)   . Anxiety   . Depression   . Bipolar disorder ([place])   . COPD (chronic obstructive pulmonary disease) ([place])   . Occasional tremors     right hand  . PTSD (post-traumatic stress disorder)   . Shortness of breath dyspnea   . Fibromyalgia   . DVT (deep venous thrombosis) ([place]) 2011    RUE  . Thyroid nodule   . DDD (degenerative disc disease), lumbar   . Spinal stenosis   . Peripheral neuropathy ([place])   . Rotator cuff tear     right  . Pneumonia 2011  . Hypothyroidism     no meds currently  . Anemia     during pregnancy only  . Hypertension     Off meds x 15 years-well controlled now per pt  . Squamous cell cancer, anus (HCC)   . Severe obstructive sleep apnea 06/27/2014  . DVT of upper extremity (deep vein thrombosis) ([place]) 10/14/2014    Patient Active Problem List   Diagnosis Date Noted  . Recurrent right pleural effusion 04/08/2015  . Hospital discharge follow-up 04/03/2015  . Recurrent pneumonia   . RUQ abdominal pain   . Sepsis ([place]) 03/15/2015  . CAP (community acquired pneumonia) 03/15/2015  . Pyrexia   . Coarse tremors 03/14/2015  . Insomnia 03/14/2015  . Poor mobility 03/14/2015  . HCAP (healthcare-associated pneumonia) 03/05/2015  . 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 GI bleed  03/02/2015  . SBO (small bowel obstruction) ([place])   . Anemia   . COPD exacerbation ([place]) 02/27/2015  . Aspiration pneumonitis ([place]) 02/27/2015  . Small bowel obstruction ([place]) 02/27/2015  . Hyperglycemia 02/27/2015  . Chronic pain syndrome 02/27/2015  . COPD (chronic obstructive pulmonary disease) ([place]) 02/26/2015  . COPD, very severe ([place]) 12/12/2014  . Fibromyalgia 12/12/2014  . DVT of upper extremity (deep vein thrombosis) ([place]) 10/14/2014  . B12 deficiency 09/28/2014  . Folate deficiency 09/28/2014  . Macrocytosis 09/21/2014  . Depression   . Hypokalemia 07/20/2014  . Anal cancer ([place]) 06/27/2014  . Anal fissure 06/27/2014  . Anxiety 06/27/2014  . Airway hyperreactivity 06/27/2014  . H/O manic depressive disorder 06/27/2014  . CAFL (chronic airflow limitation) ([place]) 06/27/2014  . Clinical depression 06/27/2014  . Deep vein thrombosis ([place]) 06/27/2014  . External hemorrhoid 06/27/2014  . Myalgia and myositis 06/27/2014  . Acid reflux 06/27/2014  . Hemorrhoid 06/27/2014  . HTN (hypertension) 06/27/2014  . HLD (hyperlipidemia) 06/27/2014  . Adult hypothyroidism 06/27/2014  . LBP (low back pain) 06/27/2014  . Mass of perianal area 06/27/2014  . External hemorrhoid, thrombosed 06/27/2014  . 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 Ulcerated hemorrhoid 06/27/2014  . Severe obstructive sleep apnea 06/27/2014  . Squamous cell cancer, anus (HCC)   . Rectal ulcer 05/17/2014    Past Surgical History  Procedure Laterality Date  . Foot  surgery Right   . Tubal ligation    . Eye surgery Bilateral   . Mouth surgery  2002  . Rectal biopsy N/A 05/08/2014    Procedure: BIOPSY RECTAL;  Surgeon: Marlyce Huge, MD;  Location: [facility] ORS;  Service: General;  Laterality: N/A;  . Evaluation under anesthesia with hemorrhoidectomy N/A 05/08/2014    Procedure: EXAM UNDER ANESTHESIA WITH HEMORRHOIDECTOMY;  Surgeon: Marlyce Huge, MD;  Location: [facility] ORS;  Service: General;   Laterality: N/A;  . Portacath placement N/A 05/20/2014    Procedure: INSERTION PORT-A-CATH;  Surgeon: Florene Glen, MD;  Location: [facility] ORS;  Service: General;  Laterality: N/A;  . Laparoscopic diverted colostomy N/A 05/26/2014    Procedure: LAPAROSCOPIC DIVERTED COLOSTOMY;  Surgeon: Marlyce Huge, MD;  Location: [facility] ORS;  Service: General;  Laterality: N/A;  . Peripheral vascular catheterization Left 10/16/2014    Procedure: Upper Extremity Venography with thrombectomy, port removal;  Surgeon: Algernon Huxley, MD;  Location: [place] [facility];  Service: Cardiovascular;  Laterality: Left;  . Peripheral vascular catheterization  10/16/2014    Procedure: Upper Extremity Intervention;  Surgeon: Algernon Huxley, MD;  Location: [place] [facility];  Service: Cardiovascular;;  . Video assisted thoracoscopy (vats)/thorocotomy Right 04/10/2015    Procedure: PRE-OP BRONCHOSCOPY, VIDEO ASSISTED THORACOSCOPY RIGHT WITH PLEURAL BIOPSIES, & PLEURAL DESIS;  Surgeon: Nestor Lewandowsky, MD;  Location: [facility];  Service: Thoracic;  Laterality: Right;    Current Outpatient Rx  Name  Route  Sig  Dispense  Refill  . albuterol (PROVENTIL HFA;VENTOLIN HFA) 108 (90 BASE) MCG/ACT inhaler   Inhalation   Inhale 2 puffs into the lungs every 6 (six) hours as needed for wheezing or shortness of breath.          Marland Kitchen albuterol (PROVENTIL) (2.5 MG/3ML) 0.083% nebulizer solution   Nebulization   Take 3 mLs (2.5 mg total) by nebulization every 4 (four) hours. Patient taking differently: Take 2.5 mg by nebulization every 4 (four) hours as needed for wheezing or shortness of breath.    360 vial   12   . ARIPiprazole (ABILIFY) 2 MG tablet   Oral   Take 1 tablet (2 mg total) by mouth daily.   30 tablet   4   . aspirin EC 81 MG EC tablet   Oral   Take 1 tablet (81 mg total) by mouth daily.   30 tablet   1   . baclofen (LIORESAL) 10 MG tablet   Oral   Take 1 tablet (10 mg total) by mouth 3 (three) times  daily. Patient taking differently: Take 10 mg by mouth 3 (three) times daily as needed for muscle spasms.    90 each   6   . budesonide-formoterol (SYMBICORT) 160-4.5 MCG/ACT inhaler   Inhalation   Inhale 2 puffs into the lungs 2 (two) times daily.   1 Inhaler   12   . cholecalciferol (VITAMIN D) 1000 units tablet   Oral   Take 1,000 Units by mouth daily.          . clonazePAM (KLONOPIN) 1 MG tablet   Oral   Take 1 tablet (1 mg total) by mouth 3 (three) times daily as needed for anxiety.   90 tablet   4   . DULoxetine (CYMBALTA) 60 MG capsule   Oral   Take 60 mg by mouth every morning.          . escitalopram (LEXAPRO) 20 MG tablet   Oral   Take 1  tablet (20 mg total) by mouth daily.   30 tablet   4     HOLD UNTIL PATIENT CALLS FOR REFILL   . ferrous sulfate 325 (65 FE) MG tablet   Oral   Take 1 tablet (325 mg total) by mouth 2 (two) times daily with a meal.   60 tablet   0   . folic acid (FOLVITE) 1 MG tablet   Oral   Take 1 tablet (1 mg total) by mouth daily.   30 tablet   3   . nicotine (NICODERM CQ - DOSED IN MG/24 HOURS) 21 mg/24hr patch   Transdermal   Place 1 patch (21 mg total) onto the skin daily.   28 patch   0   . nystatin (MYCOSTATIN) 100000 UNIT/ML suspension   Oral   Take 5 mLs (500,000 Units total) by mouth 4 (four) times daily.   60 mL   2   . oxyCODONE-acetaminophen (PERCOCET/ROXICET) 5-325 MG tablet   Oral   Take 1 tablet by mouth every 8 (eight) hours as needed for moderate pain.   15 tablet   0   . pantoprazole (PROTONIX) 40 MG tablet   Oral   Take 40 mg by mouth 2 (two) times daily.          . polyethylene glycol (MIRALAX / GLYCOLAX) packet   Oral   Take 17 g by mouth daily.   14 each   0   . pregabalin (LYRICA) 200 MG capsule   Oral   Take 1 capsule (200 mg total) by mouth 2 (two) times daily as needed. Patient taking differently: Take 200 mg by mouth 2 (two) times daily as needed (pain).    90 capsule   6   .  senna-docusate (SENOKOT-S) 8.6-50 MG tablet   Oral   Take 1 tablet by mouth at bedtime as needed for mild constipation.   30 tablet   5   . tiotropium (SPIRIVA) 18 MCG inhalation capsule   Inhalation   Place 1 capsule (18 mcg total) into inhaler and inhale daily.   30 capsule   12   . traZODone (DESYREL) 50 MG tablet   Oral   Take 1 tablet (50 mg total) by mouth at bedtime as needed for sleep.   90 tablet   1   . zolpidem (AMBIEN) 10 MG tablet   Oral   Take 1 tablet (10 mg total) by mouth at bedtime as needed for sleep.   30 tablet   4     Allergies Fish allergy and Sulfa antibiotics  Family History  Problem Relation Age of Onset  . Cancer Maternal Aunt   . Cancer Paternal Uncle   . Diabetes Mother   . Thyroid disease Mother   . Kidney failure Mother   . Hypertension Mother   . Depression Mother   . Mental illness Son   . Aneurysm Maternal Grandmother   . Thyroid disease Maternal Grandmother   . Stroke Maternal Grandfather   . Hypertension Maternal Grandfather   . Diabetes Maternal Grandfather   . Heart disease Maternal Grandfather     MI  . Nephrolithiasis Daughter     Social History Social History  Substance Use Topics  . Smoking status: Former Smoker -- 0.10 packs/day for 44 years    Types: Cigarettes    Start date: 10/04/1970  . Smokeless tobacco: Never Used  . Alcohol Use: No     Comment: 1 drink every 2-3 times/year  Review of Systems  Constitutional: Negative for fever. Eyes: Negative for visual changes. ENT: Negative for sore throat. Cardiovascular: Negative for chest pain. Respiratory: Negative for shortness of breath. Gastrointestinal: Negative for abdominal pain, vomiting and diarrhea.Positive for GI bleed Genitourinary: Negative for dysuria. Musculoskeletal: Negative for back pain. Skin: Negative for rash. Neurological: Negative for headaches, focal weakness or numbness.  10-point ROS otherwise  negative.  ____________________________________________   PHYSICAL EXAM:  VITAL SIGNS: ED Triage Vitals  Enc Vitals Group     BP 04/17/15 2218 130/73 mmHg     Pulse Rate 04/17/15 2218 88     Resp --      Temp 04/17/15 2218 98.7 F (37.1 C)     Temp Source 04/17/15 2218 Oral     SpO2 --      Weight --      Height --      Head Cir --      Peak Flow --      Pain Score 04/17/15 2221 8     Pain Loc --      Pain Edu? --      Excl. in [place]? --      Constitutional: Alert and oriented. Well appearing and in no distress. Eyes: Conjunctivae are normal. PERRL. Normal extraocular movements. ENT   Head: Normocephalic and atraumatic.   Nose: No congestion/rhinnorhea.   Mouth/Throat: Mucous membranes are moist.   Neck: No stridor. Hematological/Lymphatic/Immunilogical: No cervical lymphadenopathy. Cardiovascular: Normal rate, regular rhythm. Normal and symmetric distal pulses are present in all extremities. No murmurs, rubs, or gallops. Respiratory: Normal respiratory effort without tachypnea nor retractions. Breath sounds are clear and equal bilaterally. No wheezes/rales/rhonchi. Gastrointestinal: Soft and nontender. No distention. There is no CVA tenderness.positive for blood and clots in colostomy bag Genitourinary: deferred Musculoskeletal: Nontender with normal range of motion in all extremities. No joint effusions.  No lower extremity tenderness nor edema. Neurologic:  Normal speech and language. No gross focal neurologic deficits are appreciated. Speech is normal.  Skin:  Skin is warm, dry and intact. No rash noted. Psychiatric: Mood and affect are normal. Speech and behavior are normal. Patient exhibits appropriate insight and judgment.  ____________________________________________    LABS (pertinent positives/negatives)  Labs Reviewed  CBC WITH DIFFERENTIAL/PLATELET - Abnormal; Notable for the following:    WBC 12.0 (*)    RBC 3.30 (*)    Hemoglobin 9.4 (*)     HCT 28.9 (*)    RDW 17.1 (*)    Neutro Abs 9.9 (*)    Lymphs Abs 0.8 (*)    All other components within normal limits  COMPREHENSIVE METABOLIC PANEL - Abnormal; Notable for the following:    Sodium 133 (*)    Calcium 7.9 (*)    Total Protein 5.6 (*)    Albumin 1.9 (*)    ALT 12 (*)    Alkaline Phosphatase 131 (*)    All other components within normal limits  URINALYSIS COMPLETEWITH MICROSCOPIC ([facility] ONLY) - Abnormal; Notable for the following:    Color, Urine YELLOW (*)    APPearance CLEAR (*)    Hgb urine dipstick 2+ (*)    Leukocytes, UA TRACE (*)    Bacteria, UA RARE (*)    Squamous Epithelial / LPF 0-5 (*)    All other components within normal limits  LIPASE, BLOOD  TROPONIN I  PROTIME-INR     ____________________________________________   EKG  ED ECG REPORT I, [place] Constanza Mincy, the attending physician, personally viewed and interpreted this  ECG.   Date: 04/18/2015  EKG Time: 10:21PM  Rate: 93  Rhythm: Normal Sinus Rhythm  Axis: Normal   Intervals:Normal   ST&T Change: None     INITIAL IMPRESSION / ASSESSMENT AND PLAN / ED COURSE  Pertinent labs & imaging results that were available during my care of the patient were reviewed by me and considered in my medical decision making (see chart for details).  Patient discussed with Dr. Jannifer Franklin for hospital admission for further evaluation and management  ____________________________________________   FINAL CLINICAL IMPRESSION(S) / ED DIAGNOSES  Final diagnoses:  Gastrointestinal hemorrhage with melena      Gregor Hams, MD 04/18/15 [phone]

## 2015-04-18 NOTE — Consult Note (Signed)
  Pt seen and examined. Please see D. Martin's notes. Hx of anal cancer, treated with chemo/XRT as well as colostomy. Now has lung cancer and 2 L of O2. Was seen in GI in 1/17 and recommended to have both EGD/colonoscopy. Unfortunately, cancelled due to pt not tolerating the bowel prep. Has chronic mid abdominal pain. Woke up from sleep with large blood in colostomy bag. Describes it as almost black. Pt can bleed from colostomy site but blood should be bright in color. Will attempt to repeat EGD/colonoscopy late tomorrow afternoon. Will try miralax prep instead. Thanks.

## 2015-04-18 NOTE — Plan of Care (Signed)
Problem: Bowel/Gastric: Goal: Will not experience complications related to bowel motility Outcome: Not Progressing No s/s of bleeding noted from colostomy site. No output from colostomy at all tonight. Stoma pink/red/moist

## 2015-04-18 NOTE — Telephone Encounter (Signed)
Phone call in error

## 2015-04-18 NOTE — Consult Note (Signed)
Reason for Consult: GI Bleed Referring Physician: Dr. Deatra Norberto [name] is an 60 y.o. female admitted for evaluation of dark blood in her colostomy bag.   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 Completed chemotherapy and radiation in 2016. She was seen in our clinic in Jan 2017 for evaluation of severe periumbilical pain with a large amount of maroon-dark stools noted in her colostomy. She was scheduled for an EGD and Colostomy for evaluation but unfortunately had to cancel stating she couldn't complete the prep because of nausea and vomiting.   She presented to the ER last night after waking up with her colostomy bag filled with dark maroon blood. States it happened once after admission but none since then. She is having some periumbilical pain and pain adjacent to the colostomy site.   Past Medical History  Diagnosis Date  . Schizophrenia ([place])   . Asthma   . GERD (gastroesophageal reflux disease)   . Anxiety   . Depression   . Bipolar disorder ([place])   . COPD (chronic obstructive pulmonary disease) ([place])   . Occasional tremors     right hand  . PTSD (post-traumatic stress disorder)   . Shortness of breath dyspnea   . Fibromyalgia   . DVT (deep venous thrombosis) ([place]) 2011    RUE  . Thyroid nodule   . DDD (degenerative disc disease), lumbar   . Spinal stenosis   . Peripheral neuropathy ([place])   . Rotator cuff tear     right  . Pneumonia 2011  . Hypothyroidism     no meds currently  . Anemia     during pregnancy only  . Hypertension     Off meds x 15 years-well controlled now per pt  . Squamous cell cancer, anus (H[facility])   . Severe obstructive sleep apnea 06/27/2014  . DVT of upper extremity (deep vein thrombosis) ([place]) 10/14/2014    Past Surgical History  Procedure Laterality Date  . Foot surgery Right   . Tubal ligation    . Eye surgery Bilateral   . Mouth surgery  2002  . Rectal biopsy N/A 05/08/2014    Procedure: BIOPSY RECTAL;   Surgeon: Marlyce Huge, MD;  Location: [facility];  Service: General;  Laterality: N/A;  . Evaluation under anesthesia with hemorrhoidectomy N/A 05/08/2014    Procedure: EXAM UNDER ANESTHESIA WITH HEMORRHOIDECTOMY;  Surgeon: Marlyce Huge, MD;  Location: [facility];  Service: General;  Laterality: N/A;  . Portacath placement N/A 05/20/2014    Procedure: INSERTION PORT-A-CATH;  Surgeon: Florene Glen, MD;  Location: [facility];  Service: General;  Laterality: N/A;  . Laparoscopic diverted colostomy N/A 05/26/2014    Procedure: LAPAROSCOPIC DIVERTED COLOSTOMY;  Surgeon: Marlyce Huge, MD;  Location: [facility];  Service: General;  Laterality: N/A;  . Peripheral vascular catheterization Left 10/16/2014    Procedure: Upper Extremity Venography with thrombectomy, port removal;  Surgeon: Algernon Huxley, MD;  Location: [place] [facility];  Service: Cardiovascular;  Laterality: Left;  . Peripheral vascular catheterization  10/16/2014    Procedure: Upper Extremity Intervention;  Surgeon: Algernon Huxley, MD;  Location: [place] [facility];  Service: Cardiovascular;;  . Video assisted thoracoscopy (vats)/thorocotomy Right 04/10/2015    Procedure: PRE-OP BRONCHOSCOPY, VIDEO ASSISTED THORACOSCOPY RIGHT WITH PLEURAL BIOPSIES, & PLEURAL DESIS;  Surgeon: Nestor Lewandowsky, MD;  Location: [facility];  Service: Thoracic;  Laterality: Right;    Family History  Problem Relation Age of  Onset  . Cancer Maternal Aunt   . Cancer Paternal Uncle   . Diabetes Mother   . Thyroid disease Mother   . Kidney failure Mother   . Hypertension Mother   . Depression Mother   . Mental illness Son   . Aneurysm Maternal Grandmother   . Thyroid disease Maternal Grandmother   . Stroke Maternal Grandfather   . Hypertension Maternal Grandfather   . Diabetes Maternal Grandfather   . Heart disease Maternal Grandfather     MI  . Nephrolithiasis Daughter     Social History:  reports that she has quit smoking. Her smoking use  included Cigarettes. She started smoking about 44 years ago. She has a 4.4 pack-year smoking history. She has never used smokeless tobacco. She reports that she does not drink alcohol or use illicit drugs.  Allergies:  Allergies  Allergen Reactions  . Fish Allergy Anaphylaxis    Patient allergic to perch only. She can eat other fish.  . Sulfa Antibiotics Hives    Medications:  I have reviewed the patient's current medications. Prior to Admission:  Prescriptions prior to admission  Medication Sig Dispense Refill Last Dose  . albuterol (PROVENTIL HFA;VENTOLIN HFA) 108 (90 BASE) MCG/ACT inhaler Inhale 2 puffs into the lungs every 6 (six) hours as needed for wheezing or shortness of breath.    prn at prn  . albuterol (PROVENTIL) (2.5 MG/3ML) 0.083% nebulizer solution Take 3 mLs (2.5 mg total) by nebulization every 4 (four) hours. (Patient taking differently: Take 2.5 mg by nebulization every 4 (four) hours as needed for wheezing or shortness of breath. ) 360 vial 12 prn at prn  . ARIPiprazole (ABILIFY) 2 MG tablet Take 1 tablet (2 mg total) by mouth daily. 30 tablet 4 04/17/2015 at Unknown time  . aspirin EC 81 MG EC tablet Take 1 tablet (81 mg total) by mouth daily. 30 tablet 1 04/17/2015 at Unknown time  . baclofen (LIORESAL) 10 MG tablet Take 1 tablet (10 mg total) by mouth 3 (three) times daily. (Patient taking differently: Take 10 mg by mouth 3 (three) times daily as needed for muscle spasms. ) 90 each 6 prn at prn  . budesonide-formoterol (SYMBICORT) 160-4.5 MCG/ACT inhaler Inhale 2 puffs into the lungs 2 (two) times daily. 1 Inhaler 12 04/17/2015 at Unknown time  . cholecalciferol (VITAMIN D) 1000 units tablet Take 1,000 Units by mouth daily.    04/17/2015 at Unknown time  . clonazePAM (KLONOPIN) 1 MG tablet Take 1 tablet (1 mg total) by mouth 3 (three) times daily as needed for anxiety. 90 tablet 4 prn at prn  . DULoxetine (CYMBALTA) 60 MG capsule Take 60 mg by mouth every morning.     04/17/2015 at Unknown time  . escitalopram (LEXAPRO) 20 MG tablet Take 1 tablet (20 mg total) by mouth daily. 30 tablet 4 04/17/2015 at Unknown time  . ferrous sulfate 325 (65 FE) MG tablet Take 1 tablet (325 mg total) by mouth 2 (two) times daily with a meal. 60 tablet 0 04/17/2015 at Unknown time  . folic acid (FOLVITE) 1 MG tablet Take 1 tablet (1 mg total) by mouth daily. 30 tablet 3 04/17/2015 at Unknown time  . nicotine (NICODERM CQ - DOSED IN MG/24 HOURS) 21 mg/24hr patch Place 1 patch (21 mg total) onto the skin daily. 28 patch 0 unknown at unknown  . nystatin (MYCOSTATIN) 100000 UNIT/ML suspension Take 5 mLs (500,000 Units total) by mouth 4 (four) times daily. 60 mL 2 04/17/2015 at  Unknown time  . oxyCODONE-acetaminophen (PERCOCET/ROXICET) 5-325 MG tablet Take 1 tablet by mouth every 8 (eight) hours as needed for moderate pain. 15 tablet 0 prn at prn  . pantoprazole (PROTONIX) 40 MG tablet Take 40 mg by mouth 2 (two) times daily.    04/17/2015 at Unknown time  . polyethylene glycol (MIRALAX / GLYCOLAX) packet Take 17 g by mouth daily. 14 each 0 04/17/2015 at Unknown time  . pregabalin (LYRICA) 200 MG capsule Take 1 capsule (200 mg total) by mouth 2 (two) times daily as needed. (Patient taking differently: Take 200 mg by mouth 2 (two) times daily as needed (pain). ) 90 capsule 6 prn at prn  . senna-docusate (SENOKOT-S) 8.6-50 MG tablet Take 1 tablet by mouth at bedtime as needed for mild constipation. 30 tablet 5 prn at prn  . tiotropium (SPIRIVA) 18 MCG inhalation capsule Place 1 capsule (18 mcg total) into inhaler and inhale daily. 30 capsule 12 04/17/2015 at Unknown time  . traZODone (DESYREL) 50 MG tablet Take 1 tablet (50 mg total) by mouth at bedtime as needed for sleep. 90 tablet 1 prn at prn  . zolpidem (AMBIEN) 10 MG tablet Take 1 tablet (10 mg total) by mouth at bedtime as needed for sleep. 30 tablet 4 prn at prn   Scheduled: . ARIPiprazole  2 mg Oral Daily  . baclofen  10 mg Oral TID  .  cholecalciferol  1,000 Units Oral Daily  . ciprofloxacin  400 mg Intravenous Q12H  . docusate sodium  100 mg Oral BID  . DULoxetine  60 mg Oral [id number]  . escitalopram  20 mg Oral Daily  . ferrous sulfate  325 mg Oral BID WC  . folic acid  1 mg Oral Daily  . metronidazole  500 mg Intravenous Q8H  . mometasone-formoterol  2 puff Inhalation BID  . nicotine  21 mg Transdermal Daily  . nystatin  5 mL Oral QID  . pantoprazole (PROTONIX) IV  40 mg Intravenous Q12H  . polyethylene glycol  17 g Oral Daily  . polyethylene glycol powder  1 Container Oral Once  . sodium chloride flush  3 mL Intravenous Q12H  . tiotropium  18 mcg Inhalation Daily   Continuous: . dextrose 5 % and 0.9% NaCl 125 mL/hr at 04/18/15 1335   NTI:[id number] **OR** acetaminophen, albuterol, clonazePAM, morphine injection, ondansetron **OR** ondansetron (ZOFRAN) IV, oxyCODONE-acetaminophen, pregabalin, senna-docusate, traZODone, zolpidem Anti-infectives    Start     Dose/Rate Route Frequency Ordered Stop   04/18/15 0400  ciprofloxacin (CIPRO) IVPB 400 mg     400 mg 200 mL/hr over 60 Minutes Intravenous Every 12 hours 04/18/15 0250     04/18/15 0245  metroNIDAZOLE (FLAGYL) IVPB 500 mg     500 mg 100 mL/hr over 60 Minutes Intravenous Every 8 hours 04/18/15 0243     04/18/15 0215  cefTRIAXone (ROCEPHIN) 1 g in dextrose 5 % 50 mL IVPB  Status:  Discontinued     1 g 100 mL/hr over 30 Minutes Intravenous Every 24 hours 04/18/15 0201 04/18/15 0220      Results for orders placed or performed during the hospital encounter of 04/17/15 (from the past 48 hour(s))  CBC with Differential     Status: Abnormal   Collection Time: 04/17/15 10:26 PM  Result Value Ref Range   WBC 12.0 (H) 3.6 - 11.0 K/uL   RBC 3.30 (L) 3.80 - 5.20 MIL/uL   Hemoglobin 9.4 (L) 12.0 - 16.0 g/dL   HCT 28.9 (L)  35.0 - 47.0 %   MCV 87.6 80.0 - 100.0 fL   MCH 28.4 26.0 - 34.0 pg   MCHC 32.5 32.0 - 36.0 g/dL   RDW 17.1 (H) 11.5 - 14.5 %   Platelets  192 150 - 440 K/uL    Comment: PLATELET COUNT CONFIRMED BY SMEAR COUNT MAY BE INACCURATE DUE TO FIBRIN CLUMPS.    Neutrophils Relative % 83% %   Neutro Abs 9.9 (H) 1.4 - 6.5 K/uL   Lymphocytes Relative 7% %   Lymphs Abs 0.8 (L) 1.0 - 3.6 K/uL   Monocytes Relative 6% %   Monocytes Absolute 0.7 0.2 - 0.9 K/uL   Eosinophils Relative 4% %   Eosinophils Absolute 0.5 0 - 0.7 K/uL   Basophils Relative 0% %   Basophils Absolute 0.0 0 - 0.1 K/uL  Comprehensive metabolic panel     Status: Abnormal   Collection Time: 04/17/15 10:26 PM  Result Value Ref Range   Sodium 133 (L) 135 - 145 mmol/L   Potassium 3.9 3.5 - 5.1 mmol/L   Chloride 102 101 - 111 mmol/L   CO2 26 22 - 32 mmol/L   Glucose, Bld 85 65 - 99 mg/dL   BUN 11 6 - 20 mg/dL   Creatinine, Ser 0.72 0.44 - 1.00 mg/dL   Calcium 7.9 (L) 8.9 - 10.3 mg/dL   Total Protein 5.6 (L) 6.5 - 8.1 g/dL   Albumin 1.9 (L) 3.5 - 5.0 g/dL   AST 34 15 - 41 U/L   ALT 12 (L) 14 - 54 U/L   Alkaline Phosphatase 131 (H) 38 - 126 U/L   Total Bilirubin 0.5 0.3 - 1.2 mg/dL   GFR calc non Af Amer >60 >60 mL/min   GFR calc Af Amer >60 >60 mL/min    Comment: (NOTE) The eGFR has been calculated using the CKD EPI equation. This calculation has not been validated in all clinical situations. eGFR's persistently <60 mL/min signify possible Chronic Kidney Disease.    Anion gap 5 5 - 15  Lipase, blood     Status: None   Collection Time: 04/17/15 10:26 PM  Result Value Ref Range   Lipase 13 11 - 51 U/L  Troponin I     Status: None   Collection Time: 04/17/15 10:26 PM  Result Value Ref Range   Troponin I <0.03 <0.031 ng/mL    Comment:        NO INDICATION OF MYOCARDIAL INJURY.   Protime-INR     Status: None   Collection Time: 04/17/15 10:26 PM  Result Value Ref Range   Prothrombin Time 14.6 11.4 - 15.0 seconds   INR 1.12   TSH     Status: None   Collection Time: 04/17/15 10:26 PM  Result Value Ref Range   TSH 4.308 0.350 - 4.500 uIU/mL  Hemoglobin  A1c     Status: None   Collection Time: 04/17/15 10:26 PM  Result Value Ref Range   Hgb A1c MFr Bld 5.1 4.0 - 6.0 %  Urinalysis complete, with microscopic     Status: Abnormal   Collection Time: 04/17/15 10:34 PM  Result Value Ref Range   Color, Urine YELLOW (A) YELLOW   APPearance CLEAR (A) CLEAR   Glucose, UA NEGATIVE NEGATIVE mg/dL   Bilirubin Urine NEGATIVE NEGATIVE   Ketones, ur NEGATIVE NEGATIVE mg/dL   Specific Gravity, Urine 1.018 1.005 - 1.030   Hgb urine dipstick 2+ (A) NEGATIVE   pH 5.0 5.0 - 8.0  Protein, ur NEGATIVE NEGATIVE mg/dL   Nitrite NEGATIVE NEGATIVE   Leukocytes, UA TRACE (A) NEGATIVE   RBC / HPF 6-30 0 - 5 RBC/hpf   WBC, UA 6-30 0 - 5 WBC/hpf   Bacteria, UA RARE (A) NONE SEEN   Squamous Epithelial / LPF 0-5 (A) NONE SEEN   Mucous PRESENT    Hyaline Casts, UA PRESENT   Hemoglobin and hematocrit, blood     Status: Abnormal   Collection Time: 04/18/15 12:53 PM  Result Value Ref Range   Hemoglobin 8.3 (L) 12.0 - 16.0 g/dL   HCT 25.6 (L) 35.0 - 47.0 %   *Note: Due to a large number of results and/or encounters for the requested time period, some results have not been displayed. A complete set of results can be found in Results Review.    Dg Chest 2 View  04/18/2015  CLINICAL DATA:  Dyspnea and left shoulder pain. Chest tube was removed yesterday. EXAM: CHEST  2 VIEW COMPARISON:  04/15/2015 FINDINGS: The right chest tube and right upper extremity PICC line have been removed. No pneumothorax is evident. Mild linear basilar opacities are present bilaterally, likely atelectatic. These are improved, with partial clearance since 04/15/2015. Pulmonary vasculature is normal. IMPRESSION: Chest tube removal. No pneumothorax. Mild atelectatic appearing basilar opacities. Electronically Signed   By: Andreas Newport M.D.   On: 04/18/2015 01:21    Review of Systems  Constitutional: Negative.   HENT: Negative.   Eyes: Negative.   Respiratory: Negative.    Cardiovascular: Negative.   Gastrointestinal: Positive for abdominal pain and blood in stool. Negative for heartburn, nausea, vomiting, diarrhea, constipation and melena.       Dark Blood in colostomy bag. Periumbilical pain and to left of ostomy.   Musculoskeletal:       Right upper shoulder pain.   Skin: Negative.   Neurological: Negative.   Endo/Heme/Allergies: Negative.   Psychiatric/Behavioral: Negative.    Blood pressure 109/46, pulse 77, temperature 97.9 F (36.6 C), temperature source Oral, resp. rate 18, height 5' 6.5" (1.689 m), weight 96.117 kg (211 lb 14.4 oz), SpO2 97 %. Physical Exam  Nursing note and vitals reviewed. Constitutional: She is oriented to person, place, and time. She appears well-developed and well-nourished. She appears distressed.  Eyes: No scleral icterus.  Neck: Normal range of motion. Neck supple. No JVD present. No tracheal deviation present. No thyromegaly present.  Cardiovascular: Normal rate, regular rhythm, normal heart sounds and intact distal pulses.  Exam reveals no gallop and no friction rub.   No murmur heard. Respiratory: Effort normal and breath sounds normal. No stridor.  GI: Soft. Bowel sounds are normal. She exhibits no mass. There is tenderness. There is no rebound and no guarding.  Colostomy noted. Stoma appears healthy. Nothing in bag. Mild tenderness in the periumbilical area and left of ostomy. No rebound or guarding.   Musculoskeletal: She exhibits no edema.  Lymphadenopathy:    She has no cervical adenopathy.  Neurological: She is alert and oriented to person, place, and time.  Skin: Skin is warm and dry. She is not diaphoretic.  Psychiatric: She has a normal mood and affect.    Assessment/Plan: GI bleed: Episode of bleeding in ostomy.Etiology isn't clear.  Hx of anal cancer. Completed chemo and radiation in August 2016.  Possible bleeding at anastomosis site. EGD & Colonoscopy to evaluate.  I advised her of the indications,  benefits, limitations and potential complications such as infection, bleeding, perforation or reaction to medications. She agreed  to proceed.   Thank you for allowing [name] to participate in patient's care.  Please call with any questions or concerns.  Patient seen in collaboration with Dr. Verdie Shire.  Mahamed Zalewski Hassell Done, FNP-BC 04/18/2015, 4:27 PM

## 2015-04-18 NOTE — Telephone Encounter (Signed)
[facility] is unable to open her to services until next week, patient has immediate needs so [facility] is opening her to services. Please disregard any orders form [facility] and sign orders for [facility]

## 2015-04-18 NOTE — Progress Notes (Signed)
Pharmacy Antibiotic Note  LABREA ECCLESTON is a 60 y.o. female admitted on 04/17/2015 with IAI.  Pharmacy has been consulted for ciprofloxacin dosing.  Plan: Ciprofloxacin 400 mg IV Q12H     Temp (24hrs), Avg:98.1 F (36.7 C), Min:97.4 F (36.3 C), Max:98.7 F (37.1 C)   Recent Labs Lab 04/13/15 0500 04/14/15 0531 04/15/15 0453 04/16/15 0540 04/17/15 2226  WBC 7.3 6.8  --  8.7 12.0*  CREATININE  --  0.71 0.72 0.79 0.72    Estimated Creatinine Clearance: 90.2 mL/min (by C-G formula based on Cr of 0.72).    Allergies  Allergen Reactions  . Fish Allergy Anaphylaxis    Patient allergic to perch only. She can eat other fish.  . Sulfa Antibiotics Hives    Thank you for allowing pharmacy to be a part of this patient's care.  Laural Benes, Pharm.D., BCPS Clinical Pharmacist 04/18/2015 2:50 AM

## 2015-04-19 ENCOUNTER — Inpatient Hospital Stay: Payer: Commercial Managed Care - HMO

## 2015-04-19 ENCOUNTER — Encounter
Admission: EM | Disposition: A | Discharge status: Home Health | Payer: Self-pay | Source: Home / Self Care | Attending: Internal Medicine

## 2015-04-19 ENCOUNTER — Inpatient Hospital Stay: Payer: Commercial Managed Care - HMO | Admitting: Anesthesiology

## 2015-04-19 ENCOUNTER — Encounter: Payer: Self-pay | Admitting: Anesthesiology

## 2015-04-19 HISTORY — PX: COLONOSCOPY: SHX5424

## 2015-04-19 HISTORY — PX: ESOPHAGOGASTRODUODENOSCOPY: SHX5428

## 2015-04-19 LAB — CBC
HEMATOCRIT: 25.5 % — AB (ref 35.0–47.0)
HEMOGLOBIN: 8.3 g/dL — AB (ref 12.0–16.0)
MCH: 29.2 pg (ref 26.0–34.0)
MCHC: 32.7 g/dL (ref 32.0–36.0)
MCV: 89.3 fL (ref 80.0–100.0)
Platelets: 189 10*3/uL (ref 150–440)
RBC: 2.85 MIL/uL — AB (ref 3.80–5.20)
RDW: 17 % — ABNORMAL HIGH (ref 11.5–14.5)
WBC: 8.3 10*3/uL (ref 3.6–11.0)

## 2015-04-19 LAB — BASIC METABOLIC PANEL
Anion gap: 6 (ref 5–15)
BUN: 8 mg/dL (ref 6–20)
CHLORIDE: 103 mmol/L (ref 101–111)
CO2: 24 mmol/L (ref 22–32)
Calcium: 7.7 mg/dL — ABNORMAL LOW (ref 8.9–10.3)
Creatinine, Ser: 0.7 mg/dL (ref 0.44–1.00)
GFR calc Af Amer: >60 mL/min (ref >60)
GFR calc non Af Amer: >60 mL/min (ref >60)
Glucose, Bld: 114 mg/dL — ABNORMAL HIGH (ref 65–99)
POTASSIUM: 3.2 mmol/L — AB (ref 3.5–5.1)
SODIUM: 133 mmol/L — AB (ref 135–145)

## 2015-04-19 SURGERY — EGD (ESOPHAGOGASTRODUODENOSCOPY)
Anesthesia: General

## 2015-04-19 MED ORDER — PROPOFOL 500 MG/50ML IV EMUL
INTRAVENOUS | Status: DC | PRN
  Administered 2015-04-19: 100 ug/kg/min via INTRAVENOUS

## 2015-04-19 MED ORDER — IPRATROPIUM-ALBUTEROL 0.5-2.5 (3) MG/3ML IN SOLN
RESPIRATORY_TRACT | Status: AC
  Administered 2015-04-19: 3 mL via RESPIRATORY_TRACT
  Filled 2015-04-19: qty 3

## 2015-04-19 MED ORDER — GLYCOPYRROLATE 0.2 MG/ML IJ SOLN
INTRAMUSCULAR | Status: DC | PRN
  Administered 2015-04-19: 0.1 mg via INTRAVENOUS

## 2015-04-19 MED ORDER — ZOLPIDEM TARTRATE 5 MG PO TABS: 5.0000 mg | ORAL_TABLET | Freq: Every evening | ORAL | Status: DC | PRN

## 2015-04-19 MED ORDER — POTASSIUM CHLORIDE 20 MEQ PO PACK
40.0000 meq | PACK | Freq: Once | ORAL | Status: AC
  Administered 2015-04-19: 10:00:00 40 meq via ORAL
  Filled 2015-04-19: qty 2

## 2015-04-19 MED ORDER — SODIUM CHLORIDE 0.9 % IV SOLN
INTRAVENOUS | Status: DC
  Administered 2015-04-19: 13:00:00 via INTRAVENOUS

## 2015-04-19 MED ORDER — MIDAZOLAM HCL 2 MG/2ML IJ SOLN
INTRAMUSCULAR | Status: DC | PRN
  Administered 2015-04-19: 1 mg via INTRAVENOUS

## 2015-04-19 MED ORDER — ONDANSETRON HCL 4 MG PO TABS: 4.0000 mg | ORAL_TABLET | Freq: Four times a day (QID) | ORAL | Status: AC | PRN

## 2015-04-19 MED ORDER — IPRATROPIUM-ALBUTEROL 0.5-2.5 (3) MG/3ML IN SOLN
3.0000 mL | Freq: Once | RESPIRATORY_TRACT | Status: AC
  Administered 2015-04-19: 3 mL via RESPIRATORY_TRACT

## 2015-04-19 MED ORDER — GADOBENATE DIMEGLUMINE 529 MG/ML IV SOLN
20.0000 mL | Freq: Once | INTRAVENOUS | Status: AC | PRN
  Administered 2015-04-19: 20 mL via INTRAVENOUS

## 2015-04-19 MED ORDER — LIDOCAINE HCL (CARDIAC) 20 MG/ML IV SOLN
INTRAVENOUS | Status: DC | PRN
  Administered 2015-04-19: 30 mg via INTRAVENOUS

## 2015-04-19 MED ORDER — FENTANYL CITRATE (PF) 100 MCG/2ML IJ SOLN
INTRAMUSCULAR | Status: DC | PRN
Start: 2015-04-19 — End: 2015-04-19
  Administered 2015-04-19: 50 ug via INTRAVENOUS

## 2015-04-19 MED ORDER — CHOLECALCIFEROL 25 MCG (1000 UT) PO TABS: 1000.0000 [IU] | ORAL_TABLET | Freq: Every day | ORAL | Status: AC

## 2015-04-19 NOTE — Anesthesia Preprocedure Evaluation (Addendum)
Anesthesia Evaluation  Patient identified by MRN, date of birth, ID band Patient awake    Reviewed: Allergy & Precautions, H&P , NPO status , Patient's Chart, lab work & pertinent test results  History of Anesthesia Complications Negative for: history of anesthetic complications  Airway Mallampati: III  TM Distance: >3 FB Neck ROM: limited    Dental  (+) Poor Dentition, Chipped, Missing, Upper Dentures   Pulmonary shortness of breath and with exertion, asthma , sleep apnea , pneumonia, COPD,  oxygen dependent, former smoker,    Pulmonary exam normal breath sounds clear to auscultation       Cardiovascular Exercise Tolerance: Poor hypertension, (-) angina+ Peripheral Vascular Disease and + DOE  (-) Past MI Normal cardiovascular exam Rhythm:regular Rate:Normal     Neuro/Psych PSYCHIATRIC DISORDERS Anxiety Depression Bipolar Disorder Schizophrenia  Neuromuscular disease    GI/Hepatic Neg liver ROS, PUD, GERD  Controlled,  Endo/Other  Hypothyroidism   Renal/GU negative Renal ROS  negative genitourinary   Musculoskeletal  (+) Arthritis , Fibromyalgia -  Abdominal   Peds  Hematology negative hematology ROS (+)   Anesthesia Other Findings Past Medical History:   Schizophrenia (H[facility])                                          Asthma                                                       GERD (gastroesophageal reflux disease)                       Anxiety                                                      Depression                                                   Bipolar disorder (HCC)                                       COPD (chronic obstructive pulmonary disease) (*              Occasional tremors                                             Comment:right hand   PTSD (post-traumatic stress disorder)                        Shortness of breath dyspnea  Fibromyalgia                                                  DVT (deep venous thrombosis) ([place])              2011           Comment:RUE   Thyroid nodule                                               DDD (degenerative disc disease), lumbar                      Spinal stenosis                                              Peripheral neuropathy (HCC)                                  Rotator cuff tear                                              Comment:right   Pneumonia                                       2011         Hypothyroidism                                                 Comment:no meds currently   Anemia                                                         Comment:during pregnancy only   Hypertension                                                   Comment:Off meds x 15 years-well controlled now per pt   Squamous cell cancer, anus ([place])                             Severe obstructive sleep apnea                  06/27/2014    DVT of upper extremity (deep vein thrombosis) * 10/14/2014   Past Surgical History:   FOOT SURGERY  Right              TUBAL LIGATION                                                EYE SURGERY                                     Bilateral              MOUTH SURGERY                                    2002         RECTAL BIOPSY                                   N/A 05/08/2014       Comment:Procedure: BIOPSY RECTAL;  Surgeon: Marlyce Huge, MD;  Location: [facility];  Service:               General;  Laterality: N/A;   EVALUATION UNDER ANESTHESIA WITH HEMORRHOIDECT* N/A 05/08/2014       Comment:Procedure: EXAM UNDER ANESTHESIA WITH               HEMORRHOIDECTOMY;  Surgeon: Marlyce Huge, MD;  Location: [facility];  Service:               General;  Laterality: N/A;   PORTACATH PLACEMENT                             N/A 05/20/2014      Comment:Procedure: INSERTION PORT-A-CATH;  Surgeon:               Florene Glen, MD;   Location: [facility];                Service: General;  Laterality: N/A;   LAPAROSCOPIC DIVERTED COLOSTOMY                 N/A 05/26/2014      Comment:Procedure: LAPAROSCOPIC DIVERTED COLOSTOMY;                Surgeon: Marlyce Huge, MD;  Location:               [facility];  Service: General;  Laterality: N/A;   PERIPHERAL VASCULAR CATHETERIZATION             Left 10/16/2014     Comment:Procedure: Upper Extremity Venography with               thrombectomy, port removal;  Surgeon: Algernon Huxley, MD;  Location: [facility];                Service: Cardiovascular;  Laterality: Left;   PERIPHERAL VASCULAR CATHETERIZATION              10/16/2014  Comment:Procedure: Upper Extremity Intervention;                Surgeon: Algernon Huxley, MD;  Location: [facility];  Service: Cardiovascular;;   VIDEO ASSISTED THORACOSCOPY (VATS)/THOROCOTOMY  Right 04/10/2015       Comment:Procedure: PRE-OP BRONCHOSCOPY, VIDEO ASSISTED               THORACOSCOPY RIGHT WITH PLEURAL BIOPSIES, &               PLEURAL DESIS;  Surgeon: Nestor Lewandowsky, MD;                Location: [facility];  Service: Thoracic;                Laterality: Right;  BMI    Body Mass Index   35.17 kg/m 2      Reproductive/Obstetrics negative OB ROS                             Anesthesia Physical Anesthesia Plan  ASA: III  Anesthesia Plan: General   Post-op Pain Management:    Induction:   Airway Management Planned:   Additional Equipment:   Intra-op Plan:   Post-operative Plan:   Informed Consent: I have reviewed the patients History and Physical, chart, labs and discussed the procedure including the risks, benefits and alternatives for the proposed anesthesia with the patient or authorized representative who has indicated his/her understanding and acceptance.   Dental Advisory Given  Plan Discussed with: Anesthesiologist, CRNA and Surgeon  Anesthesia  Plan Comments:         Anesthesia Quick Evaluation

## 2015-04-19 NOTE — Op Note (Signed)
[facility] Gastroenterology Patient Name: [name] Procedure Date: 04/19/2015 1:28 PM MRN: [id number] Account #: [id number] Date of Birth: Mar 12, 1955 Admit Type: Inpatient Age: 60 Room: [facility] ENDO ROOM 4 Gender: Female Note Status: Finalized Procedure:            Colonoscopy Indications:          Melena Providers:            Lupita Dawn. Candace Cruise, MD Referring MD:         [name], PA (Referring MD) Medicines:            Monitored Anesthesia Care Complications:        No immediate complications. Procedure:            Pre-Anesthesia Assessment:                       - Prior to the procedure, a History and Physical was                        performed, and patient medications, allergies and                        sensitivities were reviewed. The patient's tolerance of                        previous anesthesia was reviewed.                       - The risks and benefits of the procedure and the                        sedation options and risks were discussed with the                        patient. All questions were answered and informed                        consent was obtained.                       - After reviewing the risks and benefits, the patient                        was deemed in satisfactory condition to undergo the                        procedure.                       After obtaining informed consent, the colonoscope was                        passed under direct vision. Throughout the procedure,                        the patient's blood pressure, pulse, and oxygen                        saturations were monitored continuously. The                        Colonoscope was introduced through  the anus and                        advanced to the the cecum, identified by appendiceal                        orifice and ileocecal valve. The colonoscopy was                        performed without difficulty. The patient tolerated the     procedure well. The quality of the bowel preparation                        was fair. Findings:      There was a medium-sized lipoma, in the descending colon.      The exam was otherwise without abnormality.      No bleeding site found      A few small-mouthed diverticula were found in the sigmoid colon. Impression:           - Preparation of the colon was fair.                       - Medium-sized lipoma in the descending colon.                       - The examination was otherwise normal.                       - Diverticulosis in the sigmoid colon.                       - No specimens collected. Recommendation:       - The findings and recommendations were discussed with                        the patient. Procedure Code(s):    --- Professional ---                       [phone], Colonoscopy, flexible; diagnostic, including                        collection of specimen(s) by brushing or washing, when                        performed (separate procedure) Diagnosis Code(s):    --- Professional ---                       D17.5, Benign lipomatous neoplasm of intra-abdominal                        organs                       K92.1, Melena (includes Hematochezia)                       K57.30, Diverticulosis of large intestine without                        perforation or abscess without bleeding CPT copyright 2016 American [employer] Association. All rights reserved. The codes documented in this report are  preliminary and upon coder review may  be revised to meet current compliance requirements. Hulen Luster, MD 04/19/2015 1:57:35 PM This report has been signed electronically. Number of Addenda: 0 Note Initiated On: 04/19/2015 1:28 PM Scope Withdrawal Time: 0 hours 7 minutes 46 seconds  Total Procedure Duration: 0 hours 11 minutes 34 seconds       [facility]

## 2015-04-19 NOTE — Op Note (Signed)
[facility] Gastroenterology Patient Name: [name] Procedure Date: 04/19/2015 1:29 PM MRN: [id number] Account #: [id number] Date of Birth: Jan 29, 1955 Admit Type: Inpatient Age: 60 Room: [facility] ENDO ROOM 4 Gender: Female Note Status: Finalized Procedure:            Upper GI endoscopy Indications:          Melena, Nausea Providers:            Lupita Dawn. Candace Cruise, MD Referring MD:         [name], PA (Referring MD) Medicines:            Monitored Anesthesia Care Complications:        No immediate complications. Procedure:            Pre-Anesthesia Assessment:                       - Prior to the procedure, a History and Physical was                        performed, and patient medications, allergies and                        sensitivities were reviewed. The patient's tolerance of                        previous anesthesia was reviewed.                       - The risks and benefits of the procedure and the                        sedation options and risks were discussed with the                        patient. All questions were answered and informed                        consent was obtained.                       - After reviewing the risks and benefits, the patient                        was deemed in satisfactory condition to undergo the                        procedure.                       After obtaining informed consent, the endoscope was                        passed under direct vision. Throughout the procedure,                        the patient's blood pressure, pulse, and oxygen                        saturations were monitored continuously. The Endoscope  was introduced through the mouth, and advanced to the                        second part of duodenum. The upper GI endoscopy was                        accomplished without difficulty. The patient tolerated                        the procedure well. Findings:      The  examined esophagus was normal.      The entire examined stomach was normal.      The examined duodenum was normal. Impression:           - Normal esophagus.                       - Normal stomach.                       - Normal examined duodenum.                       - No specimens collected. Recommendation:       - Observe patient's clinical course.                       - The findings and recommendations were discussed with                        the patient.                       - Proceed with colonoscopy. Procedure Code(s):    --- Professional ---                       [phone], Esophagogastroduodenoscopy, flexible, transoral;                        diagnostic, including collection of specimen(s) by                        brushing or washing, when performed (separate procedure) Diagnosis Code(s):    --- Professional ---                       K92.1, Melena (includes Hematochezia)                       R11.0, Nausea CPT copyright 2016 American Medical Association. All rights reserved. The codes documented in this report are preliminary and upon coder review may  be revised to meet current compliance requirements. Hulen Luster, MD 04/19/2015 1:41:14 PM This report has been signed electronically. Number of Addenda: 0 Note Initiated On: 04/19/2015 1:29 PM      [facility]

## 2015-04-19 NOTE — Plan of Care (Signed)
Problem: Bowel/Gastric: Goal: Will show no signs and symptoms of gastrointestinal bleeding Outcome: Not Progressing Pt has completed prep for procedure today. NPO except sips medications at this time. Pt has black, watery stools with blood clots from colostomy, continue to assess.

## 2015-04-19 NOTE — Transfer of Care (Signed)
Immediate Anesthesia Transfer of Care Note  Patient: [name]  Procedure(s) Performed: Procedure(s): ESOPHAGOGASTRODUODENOSCOPY (EGD) (N/A) COLONOSCOPY (N/A)  Patient Location: PACU  Anesthesia Type:General  Level of Consciousness: awake, alert  and sedated  Airway & Oxygen Therapy: Patient Spontanous Breathing and Patient connected to nasal cannula oxygen  Post-op Assessment: Report given to RN and Post -op Vital signs reviewed and stable  Post vital signs: Reviewed and stable  Last Vitals:  Filed Vitals:   04/18/15 2055 04/19/15 0516  BP: 121/69 123/62  Pulse: 83 80  Temp: 36.6 C 36.7 C  Resp: 20 20    Complications: No apparent anesthesia complications

## 2015-04-19 NOTE — Progress Notes (Addendum)
[place] [facility] [place] NAME: [name]    MR#:  [id number]  DATE OF BIRTH:  1955/04/20  SUBJECTIVE:  CHIEF COMPLAINT:  Patient Noticed blood and clots in her colostomy bag last night eft-side Recently had her right-sided chest tube removal for pneumothorax and wants to see her lung doctor Dr. Jamal Collin.  for EGD and colonoscopy today .  REVIEW OF SYSTEMS:  CONSTITUTIONAL: No fever, fatigue or weakness.  EYES: No blurred or double vision.  EARS, NOSE, AND THROAT: No tinnitus or ear pain.  RESPIRATORY: No cough, shortness of breath, wheezing or hemoptysis.  CARDIOVASCULAR: No chest pain, orthopnea, edema.  GASTROINTESTINAL: No nausea, vomiting, diarrhea or abdominal pain. colostomy bag  GENITOURINARY: No dysuria, hematuria.  ENDOCRINE: No polyuria, nocturia,  HEMATOLOGY: No anemia, easy bruising or bleeding SKIN: No rash or lesion. MUSCULOSKELETAL: No joint pain or arthritis.   NEUROLOGIC: No tingling, numbness, weakness.  PSYCHIATRY: No anxiety or depression.   DRUG ALLERGIES:   Allergies  Allergen Reactions  . Fish Allergy Anaphylaxis    Patient allergic to perch only. She can eat other fish.  . Sulfa Antibiotics Hives    VITALS:  Blood pressure 107/67, pulse 91, temperature 98.1 F (36.7 C), temperature source Oral, resp. rate 17, height 5' 6.5" (1.689 m), weight 100.336 kg (221 lb 3.2 oz), SpO2 97 %.  PHYSICAL EXAMINATION:  GENERAL:  60 y.o.-year-old patient lying in the bed with no acute distress.  EYES: Pupils equal, round, reactive to light and accommodation. No scleral icterus. Extraocular muscles intact.  HEENT: Head atraumatic, normocephalic. Oropharynx and nasopharynx clear.  NECK:  Supple, no jugular venous distention. No thyroid enlargement, no tenderness.  LUNGS: Normal breath sounds bilaterally, no wheezing, rales,rhonchi or crepitation. No use of accessory muscles of respiration.  right side of the back with  healing chest tube removal site.  CARDIOVASCULAR: S1, S2 normal. No murmurs, rubs, or gallops.  ABDOMEN: Soft, nontender, nondistended. colostomy bag is intact with no blood  Bowel sounds present. No organomegaly or mass. left flank is tender  EXTREMITIES: No pedal edema, cyanosis, or clubbing.  NEUROLOGIC: Cranial nerves II through XII are intact. Muscle strength 5/5 in all extremities. Sensation intact. Gait not checked.  PSYCHIATRIC: The patient is alert and oriented x 3.  SKIN: No obvious rash, lesion, or ulcer.    LABORATORY PANEL:   CBC  Recent Labs Lab 04/19/15 0428  WBC 8.3  HGB 8.3*  HCT 25.5*  PLT 189   ------------------------------------------------------------------------------------------------------------------  Chemistries   Recent Labs Lab 04/16/15 0540 04/17/15 2226 04/19/15 0428  NA 135 133* 133*  K 3.3* 3.9 3.2*  CL 104 102 103  CO2 '27 26 24  '$ GLUCOSE 107* 85 114*  BUN '13 11 8  '$ CREATININE 0.79 0.72 0.70  CALCIUM 7.7* 7.9* 7.7*  MG 1.7  --   --   AST  --  34  --   ALT  --  12*  --   ALKPHOS  --  131*  --   BILITOT  --  0.5  --    ------------------------------------------------------------------------------------------------------------------  Cardiac Enzymes  Recent Labs Lab 04/17/15 2226  TROPONINI <0.03   ------------------------------------------------------------------------------------------------------------------  RADIOLOGY:  Dg Chest 2 View  04/18/2015  CLINICAL DATA:  Dyspnea and left shoulder pain. Chest tube was removed yesterday. EXAM: CHEST  2 VIEW COMPARISON:  04/15/2015 FINDINGS: The right chest tube and right upper extremity PICC line have been removed. No pneumothorax is evident. Mild linear basilar  opacities are present bilaterally, likely atelectatic. These are improved, with partial clearance since 04/15/2015. Pulmonary vasculature is normal. IMPRESSION: Chest tube removal. No pneumothorax. Mild atelectatic appearing  basilar opacities. Electronically Signed   By: Andreas Newport M.D.   On: 04/18/2015 01:21   Mr Thoracic Spine W Wo Contrast  04/19/2015  CLINICAL DATA:  Involuntary body twitches.  Right-sided back pain. EXAM: MRI THORACIC AND LUMBAR SPINE WITHOUT CONTRAST TECHNIQUE: Multiplanar and multiecho pulse sequences of the thoracic and lumbar spine were obtained without intravenous contrast. COMPARISON:  None. FINDINGS: MR THORACIC SPINE FINDINGS There is a chronic T9 vertebral body compression fracture with 50% height loss. There is a chronic T10 vertebral body compression fracture with 80% anterior height loss. There is a T12 anterior vertebral body compression fracture with approximately 90% height loss. No acute vertebral body compression fracture. The alignment is anatomic. There is no acute fracture or static listhesis. The marrow signal is normal. The thoracic spinal cord is normal in size and signal. The disc spaces are maintained. Bilateral pleural effusions. T1-T2: No significant disc protrusion, foraminal stenosis or central canal stenosis. T2-T3: No significant disc protrusion, foraminal stenosis or central canal stenosis. T3-T4: No significant disc protrusion, foraminal stenosis or central canal stenosis. T4-T5: No significant disc protrusion, foraminal stenosis or central canal stenosis. T5-T6: No significant disc protrusion, foraminal stenosis or central canal stenosis. T6-T7: No significant disc protrusion, foraminal stenosis or central canal stenosis. T7-T8: No significant disc protrusion, foraminal stenosis or central canal stenosis. T8-T9: No significant disc protrusion, foraminal stenosis or central canal stenosis. T9-T10: No significant disc protrusion, foraminal stenosis or central canal stenosis. T10-T11: No significant disc protrusion, foraminal stenosis or central canal stenosis. T11-T12: No significant disc protrusion, foraminal stenosis or central canal stenosis. MR LUMBAR SPINE FINDINGS  Segmentation: Conventional anatomy assistant, with the last open disc space designated L5-S1. Alignment: The vertebral bodies of the lumbar spine are normal in alignment. Bones: The vertebral bodies of the lumbar spine are normal in size. There is normal bone marrow signal demonstrated throughout the vertebra. The visualized portions of the SI joints are unremarkable. Conus medullaris: Extends to the L1 level and appears normal. The nerve roots of the cauda equina and the filum terminale are normal. Paraspinal and other soft tissues: There is no focal abnormality. The imaged intra-abdominal contents are unremarkable. Disc levels: Disc spaces: Degenerative disc disease with mild disc height loss at L5-S1. T12-L1: Mild broad-based disc bulge. No evidence of neural foraminal stenosis. No central canal stenosis. L1-L2: No significant disc bulge. No evidence of neural foraminal stenosis. No central canal stenosis. L2-L3: No significant disc bulge. No evidence of neural foraminal stenosis. No central canal stenosis. L3-L4: No significant disc bulge. No evidence of neural foraminal stenosis. No central canal stenosis. Mild bilateral facet arthropathy. L4-L5: Mild broad-based disc bulge. No evidence of neural foraminal stenosis. No central canal stenosis. Mild bilateral facet arthropathy. L5-S1: Mild broad-based disc bulge. No evidence of neural foraminal stenosis. No central canal stenosis. IMPRESSION: MR THORACIC SPINE IMPRESSION 1. No evidence of metastatic disease of the thoracic spine. 2. No acute thoracic spine fracture. 3. Chronic T9, T10 and T12 vertebral body compression fractures. MR LUMBAR SPINE IMPRESSION 1. No evidence of metastatic disease of the lumbar spine. 2. No acute lumbar spine fracture. Electronically Signed   By: Kathreen Devoid   On: 04/19/2015 13:02   Mr Lumbar Spine W Wo Contrast  04/19/2015  CLINICAL DATA:  Involuntary body twitches.  Right-sided back pain. EXAM: MRI THORACIC  AND LUMBAR SPINE  WITHOUT CONTRAST TECHNIQUE: Multiplanar and multiecho pulse sequences of the thoracic and lumbar spine were obtained without intravenous contrast. COMPARISON:  None. FINDINGS: MR THORACIC SPINE FINDINGS There is a chronic T9 vertebral body compression fracture with 50% height loss. There is a chronic T10 vertebral body compression fracture with 80% anterior height loss. There is a T12 anterior vertebral body compression fracture with approximately 90% height loss. No acute vertebral body compression fracture. The alignment is anatomic. There is no acute fracture or static listhesis. The marrow signal is normal. The thoracic spinal cord is normal in size and signal. The disc spaces are maintained. Bilateral pleural effusions. T1-T2: No significant disc protrusion, foraminal stenosis or central canal stenosis. T2-T3: No significant disc protrusion, foraminal stenosis or central canal stenosis. T3-T4: No significant disc protrusion, foraminal stenosis or central canal stenosis. T4-T5: No significant disc protrusion, foraminal stenosis or central canal stenosis. T5-T6: No significant disc protrusion, foraminal stenosis or central canal stenosis. T6-T7: No significant disc protrusion, foraminal stenosis or central canal stenosis. T7-T8: No significant disc protrusion, foraminal stenosis or central canal stenosis. T8-T9: No significant disc protrusion, foraminal stenosis or central canal stenosis. T9-T10: No significant disc protrusion, foraminal stenosis or central canal stenosis. T10-T11: No significant disc protrusion, foraminal stenosis or central canal stenosis. T11-T12: No significant disc protrusion, foraminal stenosis or central canal stenosis. MR LUMBAR SPINE FINDINGS Segmentation: Conventional anatomy assistant, with the last open disc space designated L5-S1. Alignment: The vertebral bodies of the lumbar spine are normal in alignment. Bones: The vertebral bodies of the lumbar spine are normal in size. There is  normal bone marrow signal demonstrated throughout the vertebra. The visualized portions of the SI joints are unremarkable. Conus medullaris: Extends to the L1 level and appears normal. The nerve roots of the cauda equina and the filum terminale are normal. Paraspinal and other soft tissues: There is no focal abnormality. The imaged intra-abdominal contents are unremarkable. Disc levels: Disc spaces: Degenerative disc disease with mild disc height loss at L5-S1. T12-L1: Mild broad-based disc bulge. No evidence of neural foraminal stenosis. No central canal stenosis. L1-L2: No significant disc bulge. No evidence of neural foraminal stenosis. No central canal stenosis. L2-L3: No significant disc bulge. No evidence of neural foraminal stenosis. No central canal stenosis. L3-L4: No significant disc bulge. No evidence of neural foraminal stenosis. No central canal stenosis. Mild bilateral facet arthropathy. L4-L5: Mild broad-based disc bulge. No evidence of neural foraminal stenosis. No central canal stenosis. Mild bilateral facet arthropathy. L5-S1: Mild broad-based disc bulge. No evidence of neural foraminal stenosis. No central canal stenosis. IMPRESSION: MR THORACIC SPINE IMPRESSION 1. No evidence of metastatic disease of the thoracic spine. 2. No acute thoracic spine fracture. 3. Chronic T9, T10 and T12 vertebral body compression fractures. MR LUMBAR SPINE IMPRESSION 1. No evidence of metastatic disease of the lumbar spine. 2. No acute lumbar spine fracture. Electronically Signed   By: Kathreen Devoid   On: 04/19/2015 13:02    EKG:   Orders placed or performed during the hospital encounter of 04/17/15  . EKG 12-Lead  . EKG 12-Lead   *Note: Due to a large number of results and/or encounters for the requested time period, some results have not been displayed. A complete set of results can be found in Results Review.    ASSESSMENT AND PLAN:    This is a 60 year old female admitted for gastrointestinal  bleeding. 1. GI bleed: Unclear origin though her bleeding is acute.  She is hemodynamically  stable. For EGD and colonoscopy today  Monitor- hemoglobin and hematocrit.  8.2-8.3 Gastroenterology consulted.  appreciate Dr. Candace Cruise s recommendations T he patient is nothing by mouth except for medications.  provide PPI with IV fluids and empiric antibiotics Cipro and Flagyl   2. Lung cancer: Metastatic squamous cell; status post pleurodesis . The patient has very mild chest wall pain at the site of her chest tube incision. Now she complains of some right upper quadrant pain. I suspect this is some diaphragmatic inflammatory pain. Continue oral analgesics. I have ordered IV narcotics for breakthrough pain. consult is placed to pulmonology Dr. Jamal Collin at patient's request. MRI of the thoracolumbar spine with no spinal metastases but reveals chronic compression fractures  3. Acute cystitis- urine culture with the greater than 100,000 colonies of gram-negative rods n  on Cipro and Flagyl which will cover both UTI and intra-abdominal infection.   4. Hypothyroidism: Continue Synthroid  5. COPD: Continue Spiriva and inhaled corticosteroid  6. Depression: Continue Abilify  7. DVT prophylaxis: SCDs  8. GI prophylaxis: IV PPI    All the records are reviewed and case discussed with Care Management/Social Workerr. Management plans discussed with the patient  and she is  in agreement.  CODE STATUS: fc  TOTAL TIME TAKING CARE OF THIS PATIENT: 35  minutes.   POSSIBLE D/C IN 2-3  DAYS, DEPENDING ON CLINICAL CONDITION.   Nicholes Mango M.D on 04/19/2015 at 3:05 PM  Between 7am to 6pm - Pager - [phone] After 6pm go to [email or web address] - password [email or web address] [facility]  Office  [phone]  CC: Primary care physician; [name], NP

## 2015-04-19 NOTE — Anesthesia Procedure Notes (Signed)
Performed by: Vaughan Sine Pre-anesthesia Checklist: Patient identified, Emergency Drugs available, Suction available, Patient being monitored and Timeout performed Patient Re-evaluated:Patient Re-evaluated prior to inductionOxygen Delivery Method: Nasal cannula Preoxygenation: Pre-oxygenation with 100% oxygen Intubation Type: IV induction Airway Equipment and Method: Bite block Placement Confirmation: positive ETCO2 and CO2 detector

## 2015-04-19 NOTE — Clinical Documentation Improvement (Signed)
Internal Medicine  Can the diagnosis of anemia be further specified?   Acute Blood Loss Anemia, including the suspected or known cause or associated condition(s)  Acute on chronic blood loss anemia, including the suspected or known cause or associated condition(s)  Chronic blood loss anemia, including the suspected or known cause or associated condition(s)  Precipitous drop in Hematocrit, including the suspected or known cause or associated condition(s)  Other  Clinically Undetermined  Document any associated diagnoses/conditions. Please update your documentation within the medical record to reflect your response to this query. Thank you.   Supporting Information:(As per notes) Pt has a GI bleed  Labs:  Component     Latest Ref Rng 04/17/2015 04/18/2015 04/18/2015 04/19/2015         12:53 PM  6:52 PM   WBC     3.6 - 11.0 K/uL 12.0 (H)   8.3  RBC     3.80 - 5.20 MIL/uL 3.30 (L)   2.85 (L)  Hemoglobin     12.0 - 16.0 g/dL 9.4 (L) 8.3 (L) 8.2 (L) 8.3 (L)    Please exercise your independent, professional judgment when responding. A specific answer is not anticipated or expected.  Thank You, Alessandra Grout, RN, BSN, CCDS,Clinical Documentation Specialist:  [phone]  [phone]=[facility]- [facility] Information Management

## 2015-04-19 NOTE — Op Note (Signed)
EGD was normal. Colonoscopy only showed few sigmoid tics and 1 small lipoma. No bleeding source found. Diet ordered. Will sign off. Thanks.

## 2015-04-19 NOTE — Anesthesia Postprocedure Evaluation (Signed)
Anesthesia Post Note  Patient: [name]  Procedure(s) Performed: Procedure(s) (LRB): ESOPHAGOGASTRODUODENOSCOPY (EGD) (N/A) COLONOSCOPY (N/A)  Patient location during evaluation: Endoscopy Anesthesia Type: General Level of consciousness: awake and alert Pain management: pain level controlled Vital Signs Assessment: post-procedure vital signs reviewed and stable Respiratory status: spontaneous breathing, nonlabored ventilation, respiratory function stable and patient connected to nasal cannula oxygen Cardiovascular status: blood pressure returned to baseline and stable Postop Assessment: no signs of nausea or vomiting Anesthetic complications: no    Last Vitals:  Filed Vitals:   04/19/15 1410 04/19/15 1420  BP: 103/62 106/64  Pulse: 92 86  Temp:    Resp: 16 17    Last Pain:  Filed Vitals:   04/19/15 1428  PainSc: 0-No pain                 Precious Haws Piscitello

## 2015-04-20 LAB — HEMOGLOBIN AND HEMATOCRIT, BLOOD
HCT: 25.2 % — ABNORMAL LOW (ref 35.0–47.0)
HEMATOCRIT: 24.4 % — AB (ref 35.0–47.0)
HEMOGLOBIN: 7.9 g/dL — AB (ref 12.0–16.0)
Hemoglobin: 8.1 g/dL — ABNORMAL LOW (ref 12.0–16.0)

## 2015-04-20 LAB — CBC
HCT: 23.7 % — ABNORMAL LOW (ref 35.0–47.0)
HEMOGLOBIN: 7.8 g/dL — AB (ref 12.0–16.0)
MCH: 29.5 pg (ref 26.0–34.0)
MCHC: 32.7 g/dL (ref 32.0–36.0)
MCV: 90.2 fL (ref 80.0–100.0)
Platelets: 176 10*3/uL (ref 150–440)
RBC: 2.63 MIL/uL — ABNORMAL LOW (ref 3.80–5.20)
RDW: 17.4 % — AB (ref 11.5–14.5)
WBC: 7.2 10*3/uL (ref 3.6–11.0)

## 2015-04-20 MED ORDER — PANTOPRAZOLE SODIUM 40 MG PO TBEC
40.0000 mg | DELAYED_RELEASE_TABLET | Freq: Two times a day (BID) | ORAL | Status: DC
  Administered 2015-04-20 – 2015-04-21 (×3): 40 mg via ORAL
  Filled 2015-04-20 (×3): qty 1

## 2015-04-20 MED ORDER — CIPROFLOXACIN HCL 500 MG PO TABS
500.0000 mg | ORAL_TABLET | Freq: Two times a day (BID) | ORAL | Status: DC
  Administered 2015-04-20 – 2015-04-21 (×2): 500 mg via ORAL
  Filled 2015-04-20 (×2): qty 1

## 2015-04-20 MED ORDER — METRONIDAZOLE 500 MG PO TABS
500.0000 mg | ORAL_TABLET | Freq: Three times a day (TID) | ORAL | Status: DC
  Administered 2015-04-20 – 2015-04-21 (×3): 500 mg via ORAL
  Filled 2015-04-20 (×4): qty 1

## 2015-04-20 NOTE — Progress Notes (Signed)
Pt seen as courtesy as she is seen by our group in the office. She has no particular respiratory complaints at this time, she is admitted for GI bleeding and had a recent admission and underwent a pleural biopsy which showed lung cancer. She had questions about who she will follow up with.   She is being followed by Mike Gip who will be initiating chemotherapy. I explained to her that her oncologist will be following her up and deciding upon when her chemotherapy will start. I will also have our office call her so that she can follow up with Dr. Alva Garnet.   Please call if there are any questions or concerns.  [name], M.D.   04/20/2015

## 2015-04-20 NOTE — Progress Notes (Signed)
[facility] NAME: [name]    MR#:  [id number]  DATE OF BIRTH:  1955/04/09  SUBJECTIVE:  CHIEF COMPLAINT:  Patient is reporting dark liquid stool in the colostomy bag . Had EGD and colonoscopy 04/20/15 which were nml  REVIEW OF SYSTEMS:  CONSTITUTIONAL: No fever, fatigue or weakness.  EYES: No blurred or double vision.  EARS, NOSE, AND THROAT: No tinnitus or ear pain.  RESPIRATORY: No cough, shortness of breath, wheezing or hemoptysis.  CARDIOVASCULAR: No chest pain, orthopnea, edema.  GASTROINTESTINAL: No nausea, vomiting, diarrhea or abdominal pain. colostomy bag  GENITOURINARY: No dysuria, hematuria.  ENDOCRINE: No polyuria, nocturia,  HEMATOLOGY: No anemia, easy bruising or bleeding SKIN: No rash or lesion. MUSCULOSKELETAL: No joint pain or arthritis.   NEUROLOGIC: No tingling, numbness, weakness.  PSYCHIATRY: No anxiety or depression.   DRUG ALLERGIES:   Allergies  Allergen Reactions  . Fish Allergy Anaphylaxis    Patient allergic to perch only. She can eat other fish.  . Sulfa Antibiotics Hives    VITALS:  Blood pressure 101/53, pulse 85, temperature 98.4 F (36.9 C), temperature source Oral, resp. rate 18, height 5' 6.5" (1.689 m), weight 102.014 kg (224 lb 14.4 oz), SpO2 91 %.  PHYSICAL EXAMINATION:  GENERAL:  60 y.o.-year-old patient lying in the bed with no acute distress.  EYES: Pupils equal, round, reactive to light and accommodation. No scleral icterus. Extraocular muscles intact.  HEENT: Head atraumatic, normocephalic. Oropharynx and nasopharynx clear.  NECK:  Supple, no jugular venous distention. No thyroid enlargement, no tenderness.  LUNGS: Normal breath sounds bilaterally, no wheezing, rales,rhonchi or crepitation. No use of accessory muscles of respiration.  right side of the back with healing chest tube removal site.  CARDIOVASCULAR: S1, S2 normal. No murmurs, rubs, or gallops.  ABDOMEN:  Soft, nontender, nondistended. colostomy bag is intact with no blood  Bowel sounds present. No organomegaly or mass. left flank is tender  EXTREMITIES: No pedal edema, cyanosis, or clubbing.  NEUROLOGIC: Cranial nerves II through XII are intact. Muscle strength 5/5 in all extremities. Sensation intact. Gait not checked.  PSYCHIATRIC: The patient is alert and oriented x 3.  SKIN: No obvious rash, lesion, or ulcer.    LABORATORY PANEL:   CBC  Recent Labs Lab 04/20/15 0438  WBC 7.2  HGB 7.8*  HCT 23.7*  PLT 176   ------------------------------------------------------------------------------------------------------------------  Chemistries   Recent Labs Lab 04/16/15 0540 04/17/15 2226 04/19/15 0428  NA 135 133* 133*  K 3.3* 3.9 3.2*  CL 104 102 103  CO2 '27 26 24  '$ GLUCOSE 107* 85 114*  BUN '13 11 8  '$ CREATININE 0.79 0.72 0.70  CALCIUM 7.7* 7.9* 7.7*  MG 1.7  --   --   AST  --  34  --   ALT  --  12*  --   ALKPHOS  --  131*  --   BILITOT  --  0.5  --    ------------------------------------------------------------------------------------------------------------------  Cardiac Enzymes  Recent Labs Lab 04/17/15 2226  TROPONINI <0.03   ------------------------------------------------------------------------------------------------------------------  RADIOLOGY:  Mr Thoracic Spine W Wo Contrast  04/19/2015  CLINICAL DATA:  Involuntary body twitches.  Right-sided back pain. EXAM: MRI THORACIC AND LUMBAR SPINE WITHOUT CONTRAST TECHNIQUE: Multiplanar and multiecho pulse sequences of the thoracic and lumbar spine were obtained without intravenous contrast. COMPARISON:  None. FINDINGS: MR THORACIC SPINE FINDINGS There is a chronic T9 vertebral body compression fracture with 50% height loss. There is  a chronic T10 vertebral body compression fracture with 80% anterior height loss. There is a T12 anterior vertebral body compression fracture with approximately 90% height loss. No acute  vertebral body compression fracture. The alignment is anatomic. There is no acute fracture or static listhesis. The marrow signal is normal. The thoracic spinal cord is normal in size and signal. The disc spaces are maintained. Bilateral pleural effusions. T1-T2: No significant disc protrusion, foraminal stenosis or central canal stenosis. T2-T3: No significant disc protrusion, foraminal stenosis or central canal stenosis. T3-T4: No significant disc protrusion, foraminal stenosis or central canal stenosis. T4-T5: No significant disc protrusion, foraminal stenosis or central canal stenosis. T5-T6: No significant disc protrusion, foraminal stenosis or central canal stenosis. T6-T7: No significant disc protrusion, foraminal stenosis or central canal stenosis. T7-T8: No significant disc protrusion, foraminal stenosis or central canal stenosis. T8-T9: No significant disc protrusion, foraminal stenosis or central canal stenosis. T9-T10: No significant disc protrusion, foraminal stenosis or central canal stenosis. T10-T11: No significant disc protrusion, foraminal stenosis or central canal stenosis. T11-T12: No significant disc protrusion, foraminal stenosis or central canal stenosis. MR LUMBAR SPINE FINDINGS Segmentation: Conventional anatomy assistant, with the last open disc space designated L5-S1. Alignment: The vertebral bodies of the lumbar spine are normal in alignment. Bones: The vertebral bodies of the lumbar spine are normal in size. There is normal bone marrow signal demonstrated throughout the vertebra. The visualized portions of the SI joints are unremarkable. Conus medullaris: Extends to the L1 level and appears normal. The nerve roots of the cauda equina and the filum terminale are normal. Paraspinal and other soft tissues: There is no focal abnormality. The imaged intra-abdominal contents are unremarkable. Disc levels: Disc spaces: Degenerative disc disease with mild disc height loss at L5-S1. T12-L1: Mild  broad-based disc bulge. No evidence of neural foraminal stenosis. No central canal stenosis. L1-L2: No significant disc bulge. No evidence of neural foraminal stenosis. No central canal stenosis. L2-L3: No significant disc bulge. No evidence of neural foraminal stenosis. No central canal stenosis. L3-L4: No significant disc bulge. No evidence of neural foraminal stenosis. No central canal stenosis. Mild bilateral facet arthropathy. L4-L5: Mild broad-based disc bulge. No evidence of neural foraminal stenosis. No central canal stenosis. Mild bilateral facet arthropathy. L5-S1: Mild broad-based disc bulge. No evidence of neural foraminal stenosis. No central canal stenosis. IMPRESSION: MR THORACIC SPINE IMPRESSION 1. No evidence of metastatic disease of the thoracic spine. 2. No acute thoracic spine fracture. 3. Chronic T9, T10 and T12 vertebral body compression fractures. MR LUMBAR SPINE IMPRESSION 1. No evidence of metastatic disease of the lumbar spine. 2. No acute lumbar spine fracture. Electronically Signed   By: Kathreen Devoid   On: 04/19/2015 13:02   Mr Lumbar Spine W Wo Contrast  04/19/2015  CLINICAL DATA:  Involuntary body twitches.  Right-sided back pain. EXAM: MRI THORACIC AND LUMBAR SPINE WITHOUT CONTRAST TECHNIQUE: Multiplanar and multiecho pulse sequences of the thoracic and lumbar spine were obtained without intravenous contrast. COMPARISON:  None. FINDINGS: MR THORACIC SPINE FINDINGS There is a chronic T9 vertebral body compression fracture with 50% height loss. There is a chronic T10 vertebral body compression fracture with 80% anterior height loss. There is a T12 anterior vertebral body compression fracture with approximately 90% height loss. No acute vertebral body compression fracture. The alignment is anatomic. There is no acute fracture or static listhesis. The marrow signal is normal. The thoracic spinal cord is normal in size and signal. The disc spaces are maintained. Bilateral pleural  effusions. T1-T2: No significant disc protrusion, foraminal stenosis or central canal stenosis. T2-T3: No significant disc protrusion, foraminal stenosis or central canal stenosis. T3-T4: No significant disc protrusion, foraminal stenosis or central canal stenosis. T4-T5: No significant disc protrusion, foraminal stenosis or central canal stenosis. T5-T6: No significant disc protrusion, foraminal stenosis or central canal stenosis. T6-T7: No significant disc protrusion, foraminal stenosis or central canal stenosis. T7-T8: No significant disc protrusion, foraminal stenosis or central canal stenosis. T8-T9: No significant disc protrusion, foraminal stenosis or central canal stenosis. T9-T10: No significant disc protrusion, foraminal stenosis or central canal stenosis. T10-T11: No significant disc protrusion, foraminal stenosis or central canal stenosis. T11-T12: No significant disc protrusion, foraminal stenosis or central canal stenosis. MR LUMBAR SPINE FINDINGS Segmentation: Conventional anatomy assistant, with the last open disc space designated L5-S1. Alignment: The vertebral bodies of the lumbar spine are normal in alignment. Bones: The vertebral bodies of the lumbar spine are normal in size. There is normal bone marrow signal demonstrated throughout the vertebra. The visualized portions of the SI joints are unremarkable. Conus medullaris: Extends to the L1 level and appears normal. The nerve roots of the cauda equina and the filum terminale are normal. Paraspinal and other soft tissues: There is no focal abnormality. The imaged intra-abdominal contents are unremarkable. Disc levels: Disc spaces: Degenerative disc disease with mild disc height loss at L5-S1. T12-L1: Mild broad-based disc bulge. No evidence of neural foraminal stenosis. No central canal stenosis. L1-L2: No significant disc bulge. No evidence of neural foraminal stenosis. No central canal stenosis. L2-L3: No significant disc bulge. No evidence of  neural foraminal stenosis. No central canal stenosis. L3-L4: No significant disc bulge. No evidence of neural foraminal stenosis. No central canal stenosis. Mild bilateral facet arthropathy. L4-L5: Mild broad-based disc bulge. No evidence of neural foraminal stenosis. No central canal stenosis. Mild bilateral facet arthropathy. L5-S1: Mild broad-based disc bulge. No evidence of neural foraminal stenosis. No central canal stenosis. IMPRESSION: MR THORACIC SPINE IMPRESSION 1. No evidence of metastatic disease of the thoracic spine. 2. No acute thoracic spine fracture. 3. Chronic T9, T10 and T12 vertebral body compression fractures. MR LUMBAR SPINE IMPRESSION 1. No evidence of metastatic disease of the lumbar spine. 2. No acute lumbar spine fracture. Electronically Signed   By: Kathreen Devoid   On: 04/19/2015 13:02    EKG:   Orders placed or performed during the hospital encounter of 04/17/15  . EKG 12-Lead  . EKG 12-Lead   *Note: Due to a large number of results and/or encounters for the requested time period, some results have not been displayed. A complete set of results can be found in Results Review.    ASSESSMENT AND PLAN:    This is a 60 year old female admitted for gastrointestinal bleeding. 1. GI bleed: Unclear origin though her bleeding is acute.  She is hemodynamically stable.   normal EGD and colonoscopy  Yeterday. Clonoscopy with divrticuosis Monitor- hemoglobin and hematocrit.  8.2-8.3-7.8  appreciate Dr. Candace Cruise s recommendations.  provide PPI with IV fluids and empiric antibiotics Cipro and Flagyl   2. Lung cancer: Metastatic squamous cell; status post pleurodesis . The patient has very mild chest wall pain at the site of her chest tube incision. Now she complains of some right upper quadrant pain. I suspect this is some diaphragmatic inflammatory pain. Continue oral analgesics. I have ordered IV narcotics for breakthrough pain.  Appreciate  pulmonology recommendations . MRI of the  thoracolumbar spine with no spinal metastases but reveals chronic compression fractures  Op fu with dr.Corcorane is recommended   3. Acute cystitis- urine culture with the greater than 100,000 colonies of gram-negative rods- ecoli  n sensitive to   Cipro   4. Hypothyroidism: Continue Synthroid  5. COPD: Continue Spiriva and inhaled corticosteroid  6. Depression: Continue Abilify  7. DVT prophylaxis: SCDs  8. GI prophylaxis: IV PPI  PT consult    All the records are reviewed and case discussed with Care Management/Social Workerr. Management plans discussed with the patient  and she is  in agreement.  CODE STATUS: fc  TOTAL TIME TAKING CARE OF THIS PATIENT: 35  minutes.   POSSIBLE D/C IN 2-3  DAYS, DEPENDING ON CLINICAL CONDITION.   Nicholes Mango M.D on 04/20/2015 at 3:31 PM  Between 7am to 6pm - Pager - [phone] After 6pm go to [email or web address] - password [email or web address]  Office  [phone]  CC: Primary care physician; [name], NP

## 2015-04-20 NOTE — Care Management Important Message (Signed)
Important Message  Patient Details  Name: [name] MRN: [id number] Date of Birth: 02-23-1955   Medicare Important Message Given:  Yes    [name] 04/20/2015, 11:02 AM

## 2015-04-20 NOTE — Progress Notes (Signed)
Pharmacy Antibiotic Note  [name] is a 60 y.o. female admitted on 04/17/2015 with [facility].  Pharmacy has been consulted for ciprofloxacin dosing.  Plan: Ciprofloxacin 400 mg IV Q12H  Height: 5' 6.5" (168.9 cm) Weight: 224 lb 14.4 oz (102.014 kg) IBW/kg (Calculated) : 60.45  Temp (24hrs), Avg:98.3 F (36.8 C), Min:98 F (36.7 C), Max:99 F (37.2 C)   Recent Labs Lab 04/14/15 0531 04/15/15 0453 04/16/15 0540 04/17/15 2226 04/19/15 0428 04/20/15 0438  WBC 6.8  --  8.7 12.0* 8.3 7.2  CREATININE 0.71 0.72 0.79 0.72 0.70  --     Estimated Creatinine Clearance: 92.2 mL/min (by C-G formula based on Cr of 0.7).    Allergies  Allergen Reactions  . Fish Allergy Anaphylaxis    Patient allergic to perch only. She can eat other fish.  . Sulfa Antibiotics Hives    Thank you for allowing pharmacy to be a part of this patient's care.  Roi Jafari G, Pharm.D., BCPS Clinical Pharmacist 04/20/2015 9:42 AM

## 2015-04-21 LAB — URINE CULTURE

## 2015-04-21 LAB — CBC
HCT: 25.9 % — ABNORMAL LOW (ref 35.0–47.0)
HEMOGLOBIN: 8.3 g/dL — AB (ref 12.0–16.0)
MCH: 28.6 pg (ref 26.0–34.0)
MCHC: 32.2 g/dL (ref 32.0–36.0)
MCV: 88.9 fL (ref 80.0–100.0)
PLATELETS: 194 10*3/uL (ref 150–440)
RBC: 2.91 MIL/uL — ABNORMAL LOW (ref 3.80–5.20)
RDW: 17.3 % — AB (ref 11.5–14.5)
WBC: 7.8 10*3/uL (ref 3.6–11.0)

## 2015-04-21 LAB — POTASSIUM: POTASSIUM: 3.1 mmol/L — AB (ref 3.5–5.1)

## 2015-04-21 MED ORDER — POTASSIUM CHLORIDE 20 MEQ PO PACK
40.0000 meq | PACK | Freq: Once | ORAL | Status: AC
  Administered 2015-04-21: 12:00:00 40 meq via ORAL
  Filled 2015-04-21: qty 2

## 2015-04-21 MED ORDER — CIPROFLOXACIN HCL 500 MG PO TABS
500.0000 mg | ORAL_TABLET | Freq: Two times a day (BID) | ORAL | Status: DC
Start: 2015-04-21 — End: 2015-05-01

## 2015-04-21 NOTE — Discharge Instructions (Signed)
Continue 2 L of oxygen via nasal cannula Regular diet Follow-up with primary care physician in a week  follow up with pulmonology in 2 weeks Follow-up with oncology in a week Follow-up with gastroenterology in a week

## 2015-04-21 NOTE — Progress Notes (Signed)
Pt discharged via wheelchair by nursing to the visitor's entrance

## 2015-04-21 NOTE — Care Management Note (Signed)
Case Management Note  Patient Details  Name: [name] MRN: [id number] Date of Birth: 28-Jul-1955  Subjective/Objective:   Home health RN services resumed with [facility] per call to [name] at [facility]                  Action/Plan:   Expected Discharge Date:                  Expected Discharge Plan:     In-House Referral:     Discharge planning Services     Post Acute Care Choice:    Choice offered to:     DME Arranged:    DME Agency:     HH Arranged:    [place] Agency:     Status of Service:     Medicare Important Message Given:  Yes Date Medicare IM Given:    Medicare IM give by:    Date Additional Medicare IM Given:    Additional Medicare Important Message give by:     If discussed at [place] of Stay Meetings, dates discussed:    Additional Comments:  Alyiah Ulloa A, RN 04/21/2015, 11:42 AM

## 2015-04-21 NOTE — Progress Notes (Signed)
MD order received in [facility] to discharge pt home with home health today; Care Management previously reestablished Home Health services that she previously had prior to admission; verbally reviewed AVS with pt including medications/gave Rx for Cipro to pt; diet; activity level; O2 at 2L [place] and follow up appointments/pt to call and schedule appointments as ordered; no questions voiced at this time; pt's discharge pending arrival of her ride home

## 2015-04-21 NOTE — Discharge Summary (Addendum)
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 Schizophrenia ([place])   . Asthma   . GERD (gastroesophageal reflux disease)   . Anxiety   . Depression   . Bipolar disorder ([place])   . COPD (chronic obstructive pulmonary disease) ([place])   . Occasional tremors     right hand  . PTSD (post-traumatic stress disorder)   . Shortness of breath dyspnea   . Fibromyalgia   . DVT (deep venous thrombosis) ([place]) 2011    RUE  . Thyroid nodule   . DDD (degenerative disc disease), lumbar   . Spinal stenosis   . Peripheral neuropathy ([place])   . Rotator cuff tear     right  . Pneumonia 2011  . Hypothyroidism     no meds currently  . Anemia     during pregnancy only  . Hypertension     Off meds x 15 years-well controlled now per pt  . Squamous cell cancer, anus (HCC)   . Severe obstructive sleep apnea 06/27/2014  . DVT of upper extremity (deep vein thrombosis) ([place]) 10/14/2014    HOSPITAL COURSE:   This is a 60 year old female admitted for gastrointestinal bleeding. 1. GI bleed: Unclear origin though her bleeding is acute, currently resolved She is hemodynamically stable.  normal EGD and colonoscopy Yeterday. Clonoscopy with divrticuosis Monitor- hemoglobin and hematocrit. 8.2-8.3-7.8-8.3 today appreciate Dr. Candace Cruise s recommendations. Okay to  discharge patient from his standpoint provided PPI with IV fluids and empiric antibiotics Cipro and Flagyl given. We will discontinue Flagyl and continue Cipro for UTI  2. Lung cancer: Metastatic squamous cell; status post pleurodesis . The patient has very mild chest wall pain at the site of her chest tube incision. Now she complains of some right upper quadrant pain. I suspect this is some diaphragmatic inflammatory pain. Continue oral analgesics. I have ordered IV narcotics for breakthrough pain.  Appreciate pulmonology recommendations . MRI of the thoracolumbar spine with no spinal metastases but reveals chronic compression fractures Op fu with dr.Corcorane is recommended   3. Acute cystitis- urine culture with the greater than 100,000 colonies of gram-negative rods- ecoli n Klebsiella sensitive to Cipro   4. Hypothyroidism: Continue Synthroid  5. COPD: Continue Spiriva and inhaled corticosteroid  6. Depression: Continue Abilify  7. DVT prophylaxis: SCDs  8. GI prophylaxis: IV PPI   PT consult  DISCHARGE CONDITIONS:   Fair  CONSULTS OBTAINED:      PROCEDURES EGD and colonoscopy  DRUG ALLERGIES:   Allergies  Allergen Reactions  . Fish Allergy Anaphylaxis    Patient allergic to perch only. She can eat other fish.  . Sulfa Antibiotics Hives    DISCHARGE MEDICATIONS:   Current Discharge Medication List    START taking these medications   Details  ciprofloxacin (CIPRO) 500 MG tablet Take 1 tablet (500 mg  total) by mouth 2 (two) times daily. Qty: 10 tablet, Refills: 0    ondansetron (ZOFRAN) 4 MG tablet Take 1 tablet (4 mg total) by mouth every 6 (six) hours as needed for nausea. Qty: 20 tablet, Refills: 0      CONTINUE these medications which have CHANGED   Details  !! cholecalciferol 1000 units tablet Take 1 tablet (1,000 Units total) by mouth daily. Qty: 30 tablet, Refills: 0     !! - Potential duplicate medications found. Please discuss with provider.     CONTINUE these medications which have NOT CHANGED   Details  albuterol (PROVENTIL HFA;VENTOLIN HFA) 108 (90 BASE) MCG/ACT inhaler Inhale 2 puffs into the lungs every 6 (six) hours as needed for wheezing or shortness of breath.     albuterol (PROVENTIL) (2.5 MG/3ML) 0.083% nebulizer solution Take 3 mLs (2.5 mg total) by nebulization every 4 (four) hours. Qty: 360 vial, Refills: 12    ARIPiprazole (ABILIFY) 2 MG tablet Take 1 tablet (2 mg total) by mouth daily. Qty: 30 tablet, Refills: 4    aspirin EC 81 MG EC tablet Take 1 tablet (81 mg total) by mouth daily. Qty: 30 tablet, Refills: 1    baclofen (LIORESAL) 10 MG tablet Take 1 tablet (10 mg total) by mouth 3 (three) times daily. Qty: 90 each, Refills: 6    budesonide-formoterol (SYMBICORT) 160-4.5 MCG/ACT inhaler Inhale 2 puffs into the lungs 2 (two) times daily. Qty: 1 Inhaler, Refills: 12    !! cholecalciferol (VITAMIN D) 1000 units tablet Take 1,000 Units by mouth daily.     clonazePAM (KLONOPIN) 1 MG tablet Take 1 tablet (1 mg total) by mouth 3 (three) times daily as needed for anxiety. Qty: 90 tablet, Refills: 4    DULoxetine (CYMBALTA) 60 MG capsule Take 60 mg by mouth every morning.     escitalopram (LEXAPRO) 20 MG tablet Take 1 tablet (20 mg total) by mouth daily. Qty: 30 tablet, Refills: 4    ferrous sulfate 325 (65 FE) MG tablet Take 1 tablet (325 mg total) by mouth 2 (two) times daily with a meal. Qty: 60 tablet, Refills: 0    folic acid (FOLVITE) 1 MG tablet Take 1 tablet (1 mg total) by mouth daily. Qty: 30 tablet, Refills: 3    nicotine (NICODERM CQ - DOSED IN MG/24 HOURS) 21 mg/24hr patch Place 1 patch (21 mg total) onto the skin daily. Qty: 28 patch, Refills: 0    nystatin (MYCOSTATIN) 100000 UNIT/ML suspension Take 5 mLs (500,000 Units total) by mouth 4 (four) times daily. Qty: 60 mL, Refills: 2    oxyCODONE-acetaminophen (PERCOCET/ROXICET) 5-325 MG tablet Take 1 tablet by mouth every 8 (eight) hours as  needed for moderate pain. Qty: 15 tablet, Refills: 0    pantoprazole (PROTONIX) 40 MG tablet Take 40 mg by mouth 2 (two) times daily.     polyethylene glycol (MIRALAX / GLYCOLAX) packet Take 17 g by mouth daily. Qty: 14 each, Refills: 0    pregabalin (LYRICA) 200 MG capsule Take 1 capsule (200 mg total) by mouth 2 (two) times daily as needed. Qty: 90 capsule, Refills: 6    senna-docusate (SENOKOT-S) 8.6-50 MG tablet Take 1 tablet by mouth at bedtime as needed for mild constipation. Qty: 30 tablet, Refills: 5    tiotropium (SPIRIVA) 18 MCG inhalation capsule Place 1 capsule (18 mcg total) into inhaler and inhale daily. Qty: 30 capsule, Refills: 12    traZODone (DESYREL) 50 MG tablet Take 1 tablet (50 mg total)  by mouth at bedtime as needed for sleep. Qty: 90 tablet, Refills: 1    zolpidem (AMBIEN) 10 MG tablet Take 1 tablet (10 mg total) by mouth at bedtime as needed for sleep. Qty: 30 tablet, Refills: 4     !! - Potential duplicate medications found. Please discuss with provider.       DISCHARGE INSTRUCTIONS:   Continue 2 L of oxygen via nasal cannula Regular diet Follow-up with primary care physician in a week  follow up with pulmonology in 2 weeks Follow-up with oncology in a week Follow-up with gastroenterology in a week   DIET:  Regular diet  DISCHARGE CONDITION:  Fair  ACTIVITY:  Activity as tolerated  OXYGEN:  Home Oxygen: Yes.     Oxygen Delivery: 2 liters/min via Patient connected to nasal cannula oxygen  DISCHARGE LOCATION:  home   If you experience worsening of your admission symptoms, develop shortness of breath, life threatening emergency, suicidal or homicidal thoughts you must seek medical attention immediately by calling 911 or calling your MD immediately  if symptoms less severe.  You Must read complete instructions/literature along with all the possible adverse reactions/side effects for all the Medicines you take and that have been prescribed  to you. Take any new Medicines after you have completely understood and accpet all the possible adverse reactions/side effects.   Please note  You were cared for by a hospitalist during your hospital stay. If you have any questions about your discharge medications or the care you received while you were in the hospital after you are discharged, you can call the unit and asked to speak with the hospitalist on call if the hospitalist that took care of you is not available. Once you are discharged, your primary care physician will handle any further medical issues. Please note that NO REFILLS for any discharge medications will be authorized once you are discharged, as it is imperative that you return to your primary care physician (or establish a relationship with a primary care physician if you do not have one) for your aftercare needs so that they can reassess your need for medications and monitor your lab values.     Today  Chief Complaint  Patient presents with  . GI Bleeding   Patient denies any other episodes of bleeding or black stool in the colostomy bag. Resting comfortably. Wants to go home   ROS:  CONSTITUTIONAL: Denies fevers, chills. Denies any fatigue, weakness.  EYES: Denies blurry vision, double vision, eye pain. EARS, NOSE, THROAT: Denies tinnitus, ear pain, hearing loss. RESPIRATORY: Denies cough, wheeze, shortness of breath.  CARDIOVASCULAR: Denies chest pain, palpitations, edema.  GASTROINTESTINAL: Denies nausea, vomiting, diarrhea, abdominal pain. Denies bright red blood per rectum. denies any blood in the colostomy bag  GENITOURINARY: Denies dysuria, hematuria. ENDOCRINE: Denies nocturia or thyroid problems. HEMATOLOGIC AND LYMPHATIC: Denies easy bruising or bleeding. SKIN: Denies rash or lesion. MUSCULOSKELETAL: Denies pain in neck, back, shoulder, knees, hips or arthritic symptoms.  NEUROLOGIC: Denies paralysis, paresthesias.  PSYCHIATRIC: Denies anxiety or  depressive symptoms.   VITAL SIGNS:  Blood pressure 95/71, pulse 76, temperature 98.6 F (37 C), temperature source Oral, resp. rate 18, height 5' 6.5" (1.689 m), weight 100.744 kg (222 lb 1.6 oz), SpO2 100 %.  I/O:    Intake/Output Summary (Last 24 hours) at 04/21/15 1215 Last data filed at 04/21/15 1210  Gross per 24 hour  Intake   9127 ml  Output    450 ml  Net   8677  ml    PHYSICAL EXAMINATION:  GENERAL:  60 y.o.-year-old patient lying in the bed with no acute distress.  EYES: Pupils equal, round, reactive to light and accommodation. No scleral icterus. Extraocular muscles intact.  HEENT: Head atraumatic, normocephalic. Oropharynx and nasopharynx clear.  NECK:  Supple, no jugular venous distention. No thyroid enlargement, no tenderness.  LUNGS: Normal breath sounds bilaterally, no wheezing, rales,rhonchi or crepitation. No use of accessory muscles of respiration.  CARDIOVASCULAR: S1, S2 normal. No murmurs, rubs, or gallops.  ABDOMEN: Soft, non-tender, non-distended. Bowel sounds present. No organomegaly or mass.  colostomy site is intact  EXTREMITIES: No pedal edema, cyanosis, or clubbing.  NEUROLOGIC: Cranial nerves II through XII are intact. Muscle strength 5/5 in all extremities. Sensation intact. Gait not checked.  PSYCHIATRIC: The patient is alert and oriented x 3.  SKIN: No obvious rash, lesion, or ulcer.   DATA REVIEW:   CBC  Recent Labs Lab 04/21/15 0645  WBC 7.8  HGB 8.3*  HCT 25.9*  PLT 194    Chemistries   Recent Labs Lab 04/16/15 0540 04/17/15 2226 04/19/15 0428 04/21/15 0645  NA 135 133* 133*  --   K 3.3* 3.9 3.2* 3.1*  CL 104 102 103  --   CO2 '27 26 24  '$ --   GLUCOSE 107* 85 114*  --   BUN '13 11 8  '$ --   CREATININE 0.79 0.72 0.70  --   CALCIUM 7.7* 7.9* 7.7*  --   MG 1.7  --   --   --   AST  --  34  --   --   ALT  --  12*  --   --   ALKPHOS  --  131*  --   --   BILITOT  --  0.5  --   --     Cardiac Enzymes  Recent Labs Lab  04/17/15 2226  TROPONINI <0.03    Microbiology Results  Results for orders placed or performed during the hospital encounter of 04/17/15  Urine culture     Status: Abnormal   Collection Time: 04/17/15 10:42 PM  Result Value Ref Range Status   Specimen Description URINE, CLEAN CATCH  Final   Special Requests NONE  Final   Culture (A)  Final    >=100,000 COLONIES/mL KLEBSIELLA PNEUMONIAE >=100,000 COLONIES/mL ESCHERICHIA COLI    Report Status 04/21/2015 FINAL  Final   Organism ID, Bacteria ESCHERICHIA COLI (A)  Final   Organism ID, Bacteria KLEBSIELLA PNEUMONIAE (A)  Final      Susceptibility   Escherichia coli - MIC*    AMPICILLIN >=32 RESISTANT Resistant     CEFAZOLIN <=4 SENSITIVE Sensitive     CEFTRIAXONE <=1 SENSITIVE Sensitive     CIPROFLOXACIN 1 SENSITIVE Sensitive     GENTAMICIN <=1 SENSITIVE Sensitive     IMIPENEM <=0.25 SENSITIVE Sensitive     NITROFURANTOIN <=16 SENSITIVE Sensitive     TRIMETH/SULFA <=20 SENSITIVE Sensitive     AMPICILLIN/SULBACTAM >=32 RESISTANT Resistant     PIP/TAZO <=4 SENSITIVE Sensitive     Extended ESBL NEGATIVE Sensitive     * >=100,000 COLONIES/mL ESCHERICHIA COLI   Klebsiella pneumoniae - MIC*    Extended ESBL NEGATIVE Sensitive     AMPICILLIN/SULBACTAM Value in next row Intermediate      INTERMEDIATE16    PIP/TAZO Value in next row Sensitive      SENSITIVE16    CEFTRIAXONE Value in next row Sensitive      SENSITIVE<=1  IMIPENEM Value in next row Sensitive      SENSITIVE1    GENTAMICIN Value in next row Sensitive      SENSITIVE<=1    CIPROFLOXACIN Value in next row Sensitive      SENSITIVE<=0.25    NITROFURANTOIN Value in next row Intermediate      INTERMEDIATE64    TRIMETH/SULFA Value in next row Sensitive      SENSITIVE<=20    * >=100,000 COLONIES/mL KLEBSIELLA PNEUMONIAE   *Note: Due to a large number of results and/or encounters for the requested time period, some results have not been displayed. A complete set of results  can be found in Results Review.    RADIOLOGY:  Dg Chest 2 View  04/18/2015  CLINICAL DATA:  Dyspnea and left shoulder pain. Chest tube was removed yesterday. EXAM: CHEST  2 VIEW COMPARISON:  04/15/2015 FINDINGS: The right chest tube and right upper extremity PICC line have been removed. No pneumothorax is evident. Mild linear basilar opacities are present bilaterally, likely atelectatic. These are improved, with partial clearance since 04/15/2015. Pulmonary vasculature is normal. IMPRESSION: Chest tube removal. No pneumothorax. Mild atelectatic appearing basilar opacities. Electronically Signed   By: Andreas Newport M.D.   On: 04/18/2015 01:21   Mr Thoracic Spine W Wo Contrast  04/19/2015  CLINICAL DATA:  Involuntary body twitches.  Right-sided back pain. EXAM: MRI THORACIC AND LUMBAR SPINE WITHOUT CONTRAST TECHNIQUE: Multiplanar and multiecho pulse sequences of the thoracic and lumbar spine were obtained without intravenous contrast. COMPARISON:  None. FINDINGS: MR THORACIC SPINE FINDINGS There is a chronic T9 vertebral body compression fracture with 50% height loss. There is a chronic T10 vertebral body compression fracture with 80% anterior height loss. There is a T12 anterior vertebral body compression fracture with approximately 90% height loss. No acute vertebral body compression fracture. The alignment is anatomic. There is no acute fracture or static listhesis. The marrow signal is normal. The thoracic spinal cord is normal in size and signal. The disc spaces are maintained. Bilateral pleural effusions. T1-T2: No significant disc protrusion, foraminal stenosis or central canal stenosis. T2-T3: No significant disc protrusion, foraminal stenosis or central canal stenosis. T3-T4: No significant disc protrusion, foraminal stenosis or central canal stenosis. T4-T5: No significant disc protrusion, foraminal stenosis or central canal stenosis. T5-T6: No significant disc protrusion, foraminal stenosis or  central canal stenosis. T6-T7: No significant disc protrusion, foraminal stenosis or central canal stenosis. T7-T8: No significant disc protrusion, foraminal stenosis or central canal stenosis. T8-T9: No significant disc protrusion, foraminal stenosis or central canal stenosis. T9-T10: No significant disc protrusion, foraminal stenosis or central canal stenosis. T10-T11: No significant disc protrusion, foraminal stenosis or central canal stenosis. T11-T12: No significant disc protrusion, foraminal stenosis or central canal stenosis. MR LUMBAR SPINE FINDINGS Segmentation: Conventional anatomy assistant, with the last open disc space designated L5-S1. Alignment: The vertebral bodies of the lumbar spine are normal in alignment. Bones: The vertebral bodies of the lumbar spine are normal in size. There is normal bone marrow signal demonstrated throughout the vertebra. The visualized portions of the SI joints are unremarkable. Conus medullaris: Extends to the L1 level and appears normal. The nerve roots of the cauda equina and the filum terminale are normal. Paraspinal and other soft tissues: There is no focal abnormality. The imaged intra-abdominal contents are unremarkable. Disc levels: Disc spaces: Degenerative disc disease with mild disc height loss at L5-S1. T12-L1: Mild broad-based disc bulge. No evidence of neural foraminal stenosis. No central canal stenosis. L1-L2: No significant  disc bulge. No evidence of neural foraminal stenosis. No central canal stenosis. L2-L3: No significant disc bulge. No evidence of neural foraminal stenosis. No central canal stenosis. L3-L4: No significant disc bulge. No evidence of neural foraminal stenosis. No central canal stenosis. Mild bilateral facet arthropathy. L4-L5: Mild broad-based disc bulge. No evidence of neural foraminal stenosis. No central canal stenosis. Mild bilateral facet arthropathy. L5-S1: Mild broad-based disc bulge. No evidence of neural foraminal stenosis. No  central canal stenosis. IMPRESSION: MR THORACIC SPINE IMPRESSION 1. No evidence of metastatic disease of the thoracic spine. 2. No acute thoracic spine fracture. 3. Chronic T9, T10 and T12 vertebral body compression fractures. MR LUMBAR SPINE IMPRESSION 1. No evidence of metastatic disease of the lumbar spine. 2. No acute lumbar spine fracture. Electronically Signed   By: Kathreen Devoid   On: 04/19/2015 13:02   Mr Lumbar Spine W Wo Contrast  04/19/2015  CLINICAL DATA:  Involuntary body twitches.  Right-sided back pain. EXAM: MRI THORACIC AND LUMBAR SPINE WITHOUT CONTRAST TECHNIQUE: Multiplanar and multiecho pulse sequences of the thoracic and lumbar spine were obtained without intravenous contrast. COMPARISON:  None. FINDINGS: MR THORACIC SPINE FINDINGS There is a chronic T9 vertebral body compression fracture with 50% height loss. There is a chronic T10 vertebral body compression fracture with 80% anterior height loss. There is a T12 anterior vertebral body compression fracture with approximately 90% height loss. No acute vertebral body compression fracture. The alignment is anatomic. There is no acute fracture or static listhesis. The marrow signal is normal. The thoracic spinal cord is normal in size and signal. The disc spaces are maintained. Bilateral pleural effusions. T1-T2: No significant disc protrusion, foraminal stenosis or central canal stenosis. T2-T3: No significant disc protrusion, foraminal stenosis or central canal stenosis. T3-T4: No significant disc protrusion, foraminal stenosis or central canal stenosis. T4-T5: No significant disc protrusion, foraminal stenosis or central canal stenosis. T5-T6: No significant disc protrusion, foraminal stenosis or central canal stenosis. T6-T7: No significant disc protrusion, foraminal stenosis or central canal stenosis. T7-T8: No significant disc protrusion, foraminal stenosis or central canal stenosis. T8-T9: No significant disc protrusion, foraminal stenosis  or central canal stenosis. T9-T10: No significant disc protrusion, foraminal stenosis or central canal stenosis. T10-T11: No significant disc protrusion, foraminal stenosis or central canal stenosis. T11-T12: No significant disc protrusion, foraminal stenosis or central canal stenosis. MR LUMBAR SPINE FINDINGS Segmentation: Conventional anatomy assistant, with the last open disc space designated L5-S1. Alignment: The vertebral bodies of the lumbar spine are normal in alignment. Bones: The vertebral bodies of the lumbar spine are normal in size. There is normal bone marrow signal demonstrated throughout the vertebra. The visualized portions of the SI joints are unremarkable. Conus medullaris: Extends to the L1 level and appears normal. The nerve roots of the cauda equina and the filum terminale are normal. Paraspinal and other soft tissues: There is no focal abnormality. The imaged intra-abdominal contents are unremarkable. Disc levels: Disc spaces: Degenerative disc disease with mild disc height loss at L5-S1. T12-L1: Mild broad-based disc bulge. No evidence of neural foraminal stenosis. No central canal stenosis. L1-L2: No significant disc bulge. No evidence of neural foraminal stenosis. No central canal stenosis. L2-L3: No significant disc bulge. No evidence of neural foraminal stenosis. No central canal stenosis. L3-L4: No significant disc bulge. No evidence of neural foraminal stenosis. No central canal stenosis. Mild bilateral facet arthropathy. L4-L5: Mild broad-based disc bulge. No evidence of neural foraminal stenosis. No central canal stenosis. Mild bilateral facet arthropathy. L5-S1: Mild  broad-based disc bulge. No evidence of neural foraminal stenosis. No central canal stenosis. IMPRESSION: MR THORACIC SPINE IMPRESSION 1. No evidence of metastatic disease of the thoracic spine. 2. No acute thoracic spine fracture. 3. Chronic T9, T10 and T12 vertebral body compression fractures. MR LUMBAR SPINE IMPRESSION  1. No evidence of metastatic disease of the lumbar spine. 2. No acute lumbar spine fracture. Electronically Signed   By: Kathreen Devoid   On: 04/19/2015 13:02    EKG:   Orders placed or performed during the hospital encounter of 04/17/15  . EKG 12-Lead  . EKG 12-Lead   *Note: Due to a large number of results and/or encounters for the requested time period, some results have not been displayed. A complete set of results can be found in Results Review.      Management plans discussed with the patient, family and she is in agreement.  CODE STATUS:     Code Status Orders        Start     Ordered   04/18/15 0244  Full code   Continuous     04/18/15 0243    Code Status History    Date Active Date Inactive Code Status Order ID Comments User Context   04/08/2015  2:01 PM 04/17/2015  7:16 PM Full Code [id number]  Idelle Crouch, MD ED   03/16/2015  1:41 AM 03/18/2015  6:23 PM Full Code [id number]  Lance Coon, MD ED   03/05/2015  6:17 AM 03/07/2015  8:09 PM Full Code [id number]  [name], MD Inpatient   02/26/2015  5:43 PM 03/03/2015  6:54 PM Full Code [id number]  Bettey Costa, MD Inpatient   10/14/2014 10:59 PM 10/17/2014  4:34 PM Full Code [id number]  Lytle Butte, MD ED   05/17/2014  8:38 PM 05/30/2014  4:55 PM Full Code [id number]  Fritzi Mandes, MD Inpatient    Advance Directive Documentation        Most Recent Value   Type of Advance Directive  Advance instruction for mental health treatment   Pre-existing out of facility DNR order (yellow form or pink MOST form)     "MOST" Form in Place?        TOTAL TIME TAKING CARE OF THIS PATIENT: 45 minutes.    '@[facility]'$ @  on 04/21/2015 at 12:15 PM  Between 7am to 6pm - Pager - [phone]  After 6pm go to [email or web address] - password [email or web address]  Office  [phone]  CC: Primary care physician; [name], NP

## 2015-04-22 ENCOUNTER — Encounter: Payer: Self-pay | Admitting: Emergency Medicine

## 2015-04-22 ENCOUNTER — Emergency Department: Payer: Commercial Managed Care - HMO

## 2015-04-22 ENCOUNTER — Observation Stay
Admission: EM | Admit: 2015-04-22 | Discharge: 2015-04-24 | Disposition: A | Discharge status: Skilled Nursing Facility | Payer: Commercial Managed Care - HMO | Attending: Internal Medicine | Admitting: Internal Medicine

## 2015-04-22 DIAGNOSIS — M797 Fibromyalgia: Secondary | ICD-10-CM | POA: Insufficient documentation

## 2015-04-22 DIAGNOSIS — M5136 Other intervertebral disc degeneration, lumbar region: Secondary | ICD-10-CM | POA: Diagnosis not present

## 2015-04-22 DIAGNOSIS — Z86718 Personal history of other venous thrombosis and embolism: Secondary | ICD-10-CM | POA: Diagnosis not present

## 2015-04-22 DIAGNOSIS — G4733 Obstructive sleep apnea (adult) (pediatric): Secondary | ICD-10-CM | POA: Diagnosis not present

## 2015-04-22 DIAGNOSIS — Z7951 Long term (current) use of inhaled steroids: Secondary | ICD-10-CM | POA: Insufficient documentation

## 2015-04-22 DIAGNOSIS — R109 Unspecified abdominal pain: Secondary | ICD-10-CM | POA: Insufficient documentation

## 2015-04-22 DIAGNOSIS — I10 Essential (primary) hypertension: Secondary | ICD-10-CM | POA: Diagnosis not present

## 2015-04-22 DIAGNOSIS — Z85048 Personal history of other malignant neoplasm of rectum, rectosigmoid junction, and anus: Secondary | ICD-10-CM | POA: Diagnosis not present

## 2015-04-22 DIAGNOSIS — M549 Dorsalgia, unspecified: Secondary | ICD-10-CM | POA: Diagnosis not present

## 2015-04-22 DIAGNOSIS — Z91013 Allergy to seafood: Secondary | ICD-10-CM | POA: Diagnosis not present

## 2015-04-22 DIAGNOSIS — Z79899 Other long term (current) drug therapy: Secondary | ICD-10-CM | POA: Diagnosis not present

## 2015-04-22 DIAGNOSIS — R251 Tremor, unspecified: Secondary | ICD-10-CM | POA: Diagnosis not present

## 2015-04-22 DIAGNOSIS — J90 Pleural effusion, not elsewhere classified: Secondary | ICD-10-CM | POA: Insufficient documentation

## 2015-04-22 DIAGNOSIS — G894 Chronic pain syndrome: Secondary | ICD-10-CM | POA: Diagnosis not present

## 2015-04-22 DIAGNOSIS — E876 Hypokalemia: Secondary | ICD-10-CM | POA: Diagnosis not present

## 2015-04-22 DIAGNOSIS — Z87891 Personal history of nicotine dependence: Secondary | ICD-10-CM | POA: Diagnosis not present

## 2015-04-22 DIAGNOSIS — R531 Weakness: Secondary | ICD-10-CM | POA: Diagnosis present

## 2015-04-22 DIAGNOSIS — Z882 Allergy status to sulfonamides status: Secondary | ICD-10-CM | POA: Insufficient documentation

## 2015-04-22 DIAGNOSIS — F419 Anxiety disorder, unspecified: Secondary | ICD-10-CM | POA: Insufficient documentation

## 2015-04-22 DIAGNOSIS — E039 Hypothyroidism, unspecified: Secondary | ICD-10-CM | POA: Insufficient documentation

## 2015-04-22 DIAGNOSIS — G629 Polyneuropathy, unspecified: Secondary | ICD-10-CM | POA: Diagnosis not present

## 2015-04-22 DIAGNOSIS — F319 Bipolar disorder, unspecified: Secondary | ICD-10-CM | POA: Diagnosis not present

## 2015-04-22 DIAGNOSIS — F4310 Post-traumatic stress disorder, unspecified: Secondary | ICD-10-CM | POA: Insufficient documentation

## 2015-04-22 DIAGNOSIS — F209 Schizophrenia, unspecified: Secondary | ICD-10-CM | POA: Diagnosis not present

## 2015-04-22 DIAGNOSIS — R0602 Shortness of breath: Secondary | ICD-10-CM | POA: Insufficient documentation

## 2015-04-22 DIAGNOSIS — J441 Chronic obstructive pulmonary disease with (acute) exacerbation: Secondary | ICD-10-CM | POA: Diagnosis not present

## 2015-04-22 LAB — CBC WITH DIFFERENTIAL/PLATELET
BASOS ABS: 0.1 10*3/uL (ref 0–0.1)
Basophils Relative: 1 %
Eosinophils Absolute: 0.5 10*3/uL (ref 0–0.7)
Eosinophils Relative: 5 %
HEMATOCRIT: 28.9 % — AB (ref 35.0–47.0)
Hemoglobin: 9.3 g/dL — ABNORMAL LOW (ref 12.0–16.0)
LYMPHS ABS: 0.7 10*3/uL — AB (ref 1.0–3.6)
LYMPHS PCT: 7 %
MCH: 28.6 pg (ref 26.0–34.0)
MCHC: 32.2 g/dL (ref 32.0–36.0)
MCV: 89 fL (ref 80.0–100.0)
MONO ABS: 0.6 10*3/uL (ref 0.2–0.9)
Monocytes Relative: 6 %
NEUTROS ABS: 8.7 10*3/uL — AB (ref 1.4–6.5)
Neutrophils Relative %: 81 %
Platelets: 248 10*3/uL (ref 150–440)
RBC: 3.25 MIL/uL — ABNORMAL LOW (ref 3.80–5.20)
RDW: 18.3 % — AB (ref 11.5–14.5)
WBC: 10.6 10*3/uL (ref 3.6–11.0)

## 2015-04-22 LAB — URINALYSIS COMPLETE WITH MICROSCOPIC (ARMC ONLY)
BILIRUBIN URINE: NEGATIVE
GLUCOSE, UA: NEGATIVE mg/dL
Ketones, ur: NEGATIVE mg/dL
Nitrite: NEGATIVE
Protein, ur: NEGATIVE mg/dL
Specific Gravity, Urine: 1.013 (ref 1.005–1.030)
pH: 5 (ref 5.0–8.0)

## 2015-04-22 LAB — COMPREHENSIVE METABOLIC PANEL
ALT: 10 U/L — AB (ref 14–54)
AST: 32 U/L (ref 15–41)
Albumin: 1.8 g/dL — ABNORMAL LOW (ref 3.5–5.0)
Alkaline Phosphatase: 92 U/L (ref 38–126)
Anion gap: 3 — ABNORMAL LOW (ref 5–15)
BILIRUBIN TOTAL: 0.5 mg/dL (ref 0.3–1.2)
CO2: 28 mmol/L (ref 22–32)
CREATININE: 0.76 mg/dL (ref 0.44–1.00)
Calcium: 8.1 mg/dL — ABNORMAL LOW (ref 8.9–10.3)
Chloride: 106 mmol/L (ref 101–111)
Glucose, Bld: 93 mg/dL (ref 65–99)
POTASSIUM: 3.3 mmol/L — AB (ref 3.5–5.1)
Sodium: 137 mmol/L (ref 135–145)
TOTAL PROTEIN: 5.5 g/dL — AB (ref 6.5–8.1)

## 2015-04-22 MED ORDER — SODIUM CHLORIDE 0.9 % IV BOLUS (SEPSIS)
500.0000 mL | Freq: Once | INTRAVENOUS | Status: AC
  Administered 2015-04-22: 500 mL via INTRAVENOUS

## 2015-04-22 MED ORDER — ONDANSETRON HCL 4 MG PO TABS
4.0000 mg | ORAL_TABLET | Freq: Four times a day (QID) | ORAL | Status: DC | PRN
  Administered 2015-04-22: 4 mg via ORAL
  Filled 2015-04-22: qty 1

## 2015-04-22 MED ORDER — ONDANSETRON HCL 4 MG/2ML IJ SOLN: 4.0000 mg | Freq: Four times a day (QID) | INTRAMUSCULAR | Status: DC | PRN

## 2015-04-22 MED ORDER — OXYCODONE-ACETAMINOPHEN 5-325 MG PO TABS
1.0000 | ORAL_TABLET | Freq: Three times a day (TID) | ORAL | Status: DC | PRN
  Administered 2015-04-22 – 2015-04-24 (×5): 1 via ORAL
  Filled 2015-04-22 (×5): qty 1

## 2015-04-22 NOTE — ED Provider Notes (Signed)
Time Seen: Approximately *2005 I have reviewed the triage notes  Chief Complaint: Abdominal Pain   History of Present Illness: [name] is a 60 y.o. female who was just recently discharged from the hospital for a gastrointestinal bleed. She has a history of anal cancer and died was given hydration and was diagnosed also with urinary tract infection. She states she never felt comfortable about being discharged due to generalized weakness. She apparently does have some home help line.there are old she's not sure exactly when the health care providers will arrive at her house. She states the people at her home are not able to take care of her such as getting her up and go the bathroom due to her generalized weakness. She denies any new bleeding from her colostomy bag. She denies any nausea vomiting and she is not aware of any fever. She describes her weakness as being generalized nonfocal but especially in her legs. She describes difficulty ambulating due to the weakness. She denies any lightheadedness or chest pain  Past Medical History  Diagnosis Date  . Schizophrenia ([place])   . Asthma   . GERD (gastroesophageal reflux disease)   . Anxiety   . Depression   . Bipolar disorder ([place])   . COPD (chronic obstructive pulmonary disease) ([place])   . Occasional tremors     right hand  . PTSD (post-traumatic stress disorder)   . Shortness of breath dyspnea   . Fibromyalgia   . DVT (deep venous thrombosis) ([place]) 2011    RUE  . Thyroid nodule   . DDD (degenerative disc disease), lumbar   . Spinal stenosis   . Peripheral neuropathy ([place])   . Rotator cuff tear     right  . Pneumonia 2011  . Hypothyroidism     no meds currently  . Anemia     during pregnancy only  . Hypertension     Off meds x 15 years-well controlled now per pt  . Squamous cell cancer, anus (HCC)   . Severe obstructive sleep apnea 06/27/2014  . DVT of upper extremity (deep vein thrombosis) ([place]) 10/14/2014     Patient Active Problem List   Diagnosis Date Noted  . Recurrent right pleural effusion 04/08/2015  . Hospital discharge follow-up 04/03/2015  . Recurrent pneumonia   . RUQ abdominal pain   . Sepsis ([place]) 03/15/2015  . CAP (community acquired pneumonia) 03/15/2015  . Pyrexia   . Coarse tremors 03/14/2015  . Insomnia 03/14/2015  . Poor mobility 03/14/2015  . HCAP (healthcare-associated pneumonia) 03/05/2015  . 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 GI bleed 03/02/2015  . SBO (small bowel obstruction) ([place])   . Anemia   . COPD exacerbation ([place]) 02/27/2015  . Aspiration pneumonitis ([place]) 02/27/2015  . Small bowel obstruction ([place]) 02/27/2015  . Hyperglycemia 02/27/2015  . Chronic pain syndrome 02/27/2015  . COPD (chronic obstructive pulmonary disease) ([place]) 02/26/2015  . COPD, very severe ([place]) 12/12/2014  . Fibromyalgia 12/12/2014  . DVT of upper extremity (deep vein thrombosis) ([place]) 10/14/2014  . B12 deficiency 09/28/2014  . Folate deficiency 09/28/2014  . Macrocytosis 09/21/2014  . Depression   . Hypokalemia 07/20/2014  . Anal cancer ([place]) 06/27/2014  . Anal fissure 06/27/2014  . Anxiety 06/27/2014  . Airway hyperreactivity 06/27/2014  . H/O manic depressive disorder 06/27/2014  . CAFL (chronic airflow limitation) ([place]) 06/27/2014  . Clinical depression 06/27/2014  . Deep vein thrombosis ([place]) 06/27/2014  . External hemorrhoid 06/27/2014  .  Myalgia and myositis 06/27/2014  . GERD (gastroesophageal reflux disease) 06/27/2014  . Hemorrhoid 06/27/2014  . HTN (hypertension) 06/27/2014  . HLD (hyperlipidemia) 06/27/2014  . Adult hypothyroidism 06/27/2014  . LBP (low back pain) 06/27/2014  . Mass of perianal area 06/27/2014  . External hemorrhoid, thrombosed 06/27/2014  . 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 Ulcerated hemorrhoid 06/27/2014  . Severe obstructive sleep apnea 06/27/2014  . Squamous cell cancer, anus (HCC)   . Rectal  ulcer 05/17/2014    Past Surgical History  Procedure Laterality Date  . Foot surgery Right   . Tubal ligation    . Eye surgery Bilateral   . Mouth surgery  2002  . Rectal biopsy N/A 05/08/2014    Procedure: BIOPSY RECTAL;  Surgeon: Marlyce Huge, MD;  Location: [facility];  Service: General;  Laterality: N/A;  . Evaluation under anesthesia with hemorrhoidectomy N/A 05/08/2014    Procedure: EXAM UNDER ANESTHESIA WITH HEMORRHOIDECTOMY;  Surgeon: Marlyce Huge, MD;  Location: [facility];  Service: General;  Laterality: N/A;  . Portacath placement N/A 05/20/2014    Procedure: INSERTION PORT-A-CATH;  Surgeon: Florene Glen, MD;  Location: [facility];  Service: General;  Laterality: N/A;  . Laparoscopic diverted colostomy N/A 05/26/2014    Procedure: LAPAROSCOPIC DIVERTED COLOSTOMY;  Surgeon: Marlyce Huge, MD;  Location: [facility];  Service: General;  Laterality: N/A;  . Peripheral vascular catheterization Left 10/16/2014    Procedure: Upper Extremity Venography with thrombectomy, port removal;  Surgeon: Algernon Huxley, MD;  Location: [place] [facility];  Service: Cardiovascular;  Laterality: Left;  . Peripheral vascular catheterization  10/16/2014    Procedure: Upper Extremity Intervention;  Surgeon: Algernon Huxley, MD;  Location: [place] [facility];  Service: Cardiovascular;;  . Video assisted thoracoscopy (vats)/thorocotomy Right 04/10/2015    Procedure: PRE-OP BRONCHOSCOPY, VIDEO ASSISTED THORACOSCOPY RIGHT WITH PLEURAL BIOPSIES, & PLEURAL DESIS;  Surgeon: Nestor Lewandowsky, MD;  Location: [facility];  Service: Thoracic;  Laterality: Right;    Past Surgical History  Procedure Laterality Date  . Foot surgery Right   . Tubal ligation    . Eye surgery Bilateral   . Mouth surgery  2002  . Rectal biopsy N/A 05/08/2014    Procedure: BIOPSY RECTAL;  Surgeon: Marlyce Huge, MD;  Location: [facility];  Service: General;  Laterality: N/A;  . Evaluation under anesthesia with  hemorrhoidectomy N/A 05/08/2014    Procedure: EXAM UNDER ANESTHESIA WITH HEMORRHOIDECTOMY;  Surgeon: Marlyce Huge, MD;  Location: [facility];  Service: General;  Laterality: N/A;  . Portacath placement N/A 05/20/2014    Procedure: INSERTION PORT-A-CATH;  Surgeon: Florene Glen, MD;  Location: [facility];  Service: General;  Laterality: N/A;  . Laparoscopic diverted colostomy N/A 05/26/2014    Procedure: LAPAROSCOPIC DIVERTED COLOSTOMY;  Surgeon: Marlyce Huge, MD;  Location: [facility];  Service: General;  Laterality: N/A;  . Peripheral vascular catheterization Left 10/16/2014    Procedure: Upper Extremity Venography with thrombectomy, port removal;  Surgeon: Algernon Huxley, MD;  Location: [place] [facility];  Service: Cardiovascular;  Laterality: Left;  . Peripheral vascular catheterization  10/16/2014    Procedure: Upper Extremity Intervention;  Surgeon: Algernon Huxley, MD;  Location: [place] [facility];  Service: Cardiovascular;;  . Video assisted thoracoscopy (vats)/thorocotomy Right 04/10/2015    Procedure: PRE-OP BRONCHOSCOPY, VIDEO ASSISTED THORACOSCOPY RIGHT WITH PLEURAL BIOPSIES, & PLEURAL DESIS;  Surgeon: Nestor Lewandowsky, MD;  Location: [facility];  Service: Thoracic;  Laterality: Right;    Current Outpatient Rx  Name  Route  Sig  Dispense  Refill  . albuterol (PROVENTIL HFA;VENTOLIN HFA) 108 (90 BASE) MCG/ACT inhaler   Inhalation   Inhale 2 puffs into the lungs every 6 (six) hours as needed for wheezing or shortness of breath.          Marland Kitchen albuterol (PROVENTIL) (2.5 MG/3ML) 0.083% nebulizer solution   Nebulization   Take 3 mLs (2.5 mg total) by nebulization every 4 (four) hours. Patient taking differently: Take 2.5 mg by nebulization every 4 (four) hours as needed for wheezing or shortness of breath.    360 vial   12   . ARIPiprazole (ABILIFY) 2 MG tablet   Oral   Take 1 tablet (2 mg total) by mouth daily.   30 tablet   4   . aspirin EC 81 MG EC tablet   Oral   Take 1  tablet (81 mg total) by mouth daily.   30 tablet   1   . baclofen (LIORESAL) 10 MG tablet   Oral   Take 1 tablet (10 mg total) by mouth 3 (three) times daily. Patient taking differently: Take 10 mg by mouth 3 (three) times daily as needed for muscle spasms.    90 each   6   . budesonide-formoterol (SYMBICORT) 160-4.5 MCG/ACT inhaler   Inhalation   Inhale 2 puffs into the lungs 2 (two) times daily.   1 Inhaler   12   . cholecalciferol (VITAMIN D) 1000 units tablet   Oral   Take 1,000 Units by mouth daily.          . cholecalciferol 1000 units tablet   Oral   Take 1 tablet (1,000 Units total) by mouth daily.   30 tablet   0   . ciprofloxacin (CIPRO) 500 MG tablet   Oral   Take 1 tablet (500 mg total) by mouth 2 (two) times daily.   10 tablet   0   . clonazePAM (KLONOPIN) 1 MG tablet   Oral   Take 1 tablet (1 mg total) by mouth 3 (three) times daily as needed for anxiety.   90 tablet   4   . DULoxetine (CYMBALTA) 60 MG capsule   Oral   Take 60 mg by mouth every morning.          . escitalopram (LEXAPRO) 20 MG tablet   Oral   Take 1 tablet (20 mg total) by mouth daily.   30 tablet   4     HOLD UNTIL PATIENT CALLS FOR REFILL   . ferrous sulfate 325 (65 FE) MG tablet   Oral   Take 1 tablet (325 mg total) by mouth 2 (two) times daily with a meal.   60 tablet   0   . folic acid (FOLVITE) 1 MG tablet   Oral   Take 1 tablet (1 mg total) by mouth daily.   30 tablet   3   . nicotine (NICODERM CQ - DOSED IN MG/24 HOURS) 21 mg/24hr patch   Transdermal   Place 1 patch (21 mg total) onto the skin daily.   28 patch   0   . nystatin (MYCOSTATIN) 100000 UNIT/ML suspension   Oral   Take 5 mLs (500,000 Units total) by mouth 4 (four) times daily.   60 mL   2   . ondansetron (ZOFRAN) 4 MG tablet   Oral   Take 1 tablet (4 mg total) by mouth every 6 (six) hours as needed for nausea.  20 tablet   0   . oxyCODONE-acetaminophen (PERCOCET/ROXICET) 5-325 MG  tablet   Oral   Take 1 tablet by mouth every 8 (eight) hours as needed for moderate pain.   15 tablet   0   . pantoprazole (PROTONIX) 40 MG tablet   Oral   Take 40 mg by mouth 2 (two) times daily.          . polyethylene glycol (MIRALAX / GLYCOLAX) packet   Oral   Take 17 g by mouth daily.   14 each   0   . pregabalin (LYRICA) 200 MG capsule   Oral   Take 1 capsule (200 mg total) by mouth 2 (two) times daily as needed. Patient taking differently: Take 200 mg by mouth 2 (two) times daily as needed (pain).    90 capsule   6   . senna-docusate (SENOKOT-S) 8.6-50 MG tablet   Oral   Take 1 tablet by mouth at bedtime as needed for mild constipation.   30 tablet   5   . tiotropium (SPIRIVA) 18 MCG inhalation capsule   Inhalation   Place 1 capsule (18 mcg total) into inhaler and inhale daily.   30 capsule   12   . traZODone (DESYREL) 50 MG tablet   Oral   Take 1 tablet (50 mg total) by mouth at bedtime as needed for sleep.   90 tablet   1   . zolpidem (AMBIEN) 10 MG tablet   Oral   Take 1 tablet (10 mg total) by mouth at bedtime as needed for sleep.   30 tablet   4     Allergies:  Fish allergy and Sulfa antibiotics  Family History: Family History  Problem Relation Age of Onset  . Cancer Maternal Aunt   . Cancer Paternal Uncle   . Diabetes Mother   . Thyroid disease Mother   . Kidney failure Mother   . Hypertension Mother   . Depression Mother   . Mental illness Son   . Aneurysm Maternal Grandmother   . Thyroid disease Maternal Grandmother   . Stroke Maternal Grandfather   . Hypertension Maternal Grandfather   . Diabetes Maternal Grandfather   . Heart disease Maternal Grandfather     MI  . Nephrolithiasis Daughter     Social History: Social History  Substance Use Topics  . Smoking status: Former Smoker -- 0.10 packs/day for 44 years    Types: Cigarettes    Start date: 10/04/1970  . Smokeless tobacco: Never Used  . Alcohol Use: No     Comment:  1 drink every 2-3 times/year     Review of Systems:   10 point review of systems was performed and was otherwise negative:  Constitutional: No fever Eyes: No visual disturbances ENT: No sore throat, ear pain Cardiac: No chest pain Respiratory: No shortness of breath, wheezing, or stridor Abdomen: Patient describes diffuse abdominal pain which is typical of her chronic abdominal discomfort from her colostomy etc. Endocrine: No weight loss, No night sweats Extremities: No peripheral edema, cyanosis Skin: No rashes, easy bruising Neurologic: No focal weakness, trouble with speech or swollowing Urologic: No dysuria, Hematuria, or urinary frequency   Physical Exam:  ED Triage Vitals  Enc Vitals Group     BP 04/22/15 1941 137/74 mmHg     Pulse Rate 04/22/15 1941 82     Resp 04/22/15 1941 20     Temp 04/22/15 1941 98.3 F (36.8 C)     Temp Source  04/22/15 1941 Oral     SpO2 04/22/15 1941 92 %     Weight 04/22/15 1941 236 lb 6.4 oz (107.23 kg)     Height 04/22/15 1941 5' 6.5" (1.689 m)     Head Cir --      Peak Flow --      Pain Score --      Pain Loc --      Pain Edu? --      Excl. in [place]? --     General: Awake , Alert , and Oriented times 3; GCS 15. Patient has  Accidents and occasions of noncompliance: Head: Normal cephalic , atraumatic Eyes: Pupils equal , round, reactive to light Nose/Throat: No nasal drainage, patent upper airway without erythema or exudate.  Neck: Supple, Full range of motion, No anterior adenopathy or palpable thyroid masses Lungs: Clear to ascultation without wheezes , rhonchi, or rales Heart: Regular rate, regular rhythm without murmurs , gallops , or rubs Abdomen: Obese Soft, tenderness without rebound, guarding , or rigidity; bowel sounds positive and symmetric in all 4 quadrants. Colostomy bag appears to still have drainage and still has diffuse tenderness    Extremities: 2 plus symmetric pulses. No edema, clubbing or cyanosis Neurologic:  normal ambulation, Motor symmetric without deficits, sensory intact Skin: warm, dry, no rashes   Labs:   All laboratory work was reviewed including any pertinent negatives or positives listed below:  Labs Reviewed  CBC WITH DIFFERENTIAL/PLATELET  COMPREHENSIVE METABOLIC PANEL  URINALYSIS COMPLETEWITH MICROSCOPIC ([place])   Review of laboratory work shows some low potassium and calcium. Hemoglobin 9.3 No abnormality seen on urinalysis.    Radiology: CLINICAL DATA: Abdominal pain. Recent hospitalization and talc pleurodesis.  EXAM: DG ABDOMEN ACUTE W/ 1V CHEST  COMPARISON: Most recent chest radiograph 04/17/2015. CT abdomen/pelvis 03/15/2015  FINDINGS: Low lung volumes with bilateral pleural effusions and bibasilar opacities. Pleural effusion on the left is increased from prior exam. Cardiomediastinal contours are unchanged. No pneumothorax.  Prominent air-filled central small bowel loops measuring up to 3.5 cm in diameter. Mild gaseous gastric distention. No evidence of free air. Bilateral tubal ligation clips. No evidence of acute osseous abnormality.  IMPRESSION: 1. Mild gaseous distention of bowel loops in the central abdomen, in a pattern suggestive of enteritis or ileus. There is no evidence of obstruction. 2. Bilateral pleural effusions and bibasilar opacities. The left pleural effusion has increased from chest radiograph 5 days prior. Right pleural thickening/effusion is unchanged.    I personally reviewed the radiologic studies     ED Course:  Patient has had some mild symptomatic improvement with some pain meds and anti-medic therapy. At the time the patient's laboratory work is pending.* Patient's case is reviewed with the hospitalist team and she agrees that she would stay for rehabilitation therapy. She has a very limited social support network at home and states she's had difficulty with ambulation and weakness.   Assessment: * Bilateral  pleural effusions Abdominal ileus Generalized weakness Electrolyte abnormalities     Plan: Inpatient management             Daymon Larsen, MD 04/23/15 1517

## 2015-04-22 NOTE — ED Notes (Signed)
Patient transported to X-ray 

## 2015-04-22 NOTE — H&P (Signed)
[facility] [place] NAME: [name]    MR#:  [id number]  DATE OF BIRTH:  27-Oct-1955  DATE OF ADMISSION:  04/22/2015  PRIMARY CARE PHYSICIAN: [name], NP   REQUESTING/REFERRING PHYSICIAN: Daymon Larsen, MD  CHIEF COMPLAINT:   Chief Complaint  Patient presents with  . Abdominal Pain   HISTORY OF PRESENT ILLNESS:  [name]  is a 60 y.o. female with a known history of anal cancer who was just recently discharged from the hospital yesterday (was admitted for gastrointestinal bleed). She was diagnosed with urinary tract infection and was discharged on ciprofloxacin. She states she never felt comfortable about being discharged due to generalized weakness, but she refused skilled nursing facility on previous admissions and was set up with home health services and now she is now agreeable to go to skilled as she realized that, she is not able to manage herself at home.  She could not even get up to go to her bathroom as she was so weak, could not change her clothes and her colostomy, and her colostomy bag started leaking and busted. She apparently had home health set up on discharge yesterday but, she's not sure exactly when the health care providers will arrive at her house. She states the people at her home are not able to take care for her.  She denies any new bleeding from her colostomy bag. She denies any nausea vomiting and she is not aware of any fever. She describes her weakness as being generalized nonfocal but especially in her legs. She describes difficulty ambulating due to the weakness. She denies any lightheadedness or chest pain. PAST MEDICAL HISTORY:   Past Medical History  Diagnosis Date  . Schizophrenia ([place])   . Asthma   . GERD (gastroesophageal reflux disease)   . Anxiety   . Depression   . Bipolar disorder ([place])   . COPD (chronic obstructive pulmonary disease) ([place])   . Occasional tremors     right  hand  . PTSD (post-traumatic stress disorder)   . Shortness of breath dyspnea   . Fibromyalgia   . DVT (deep venous thrombosis) ([place]) 2011    RUE  . Thyroid nodule   . DDD (degenerative disc disease), lumbar   . Spinal stenosis   . Peripheral neuropathy ([place])   . Rotator cuff tear     right  . Pneumonia 2011  . Hypothyroidism     no meds currently  . Anemia     during pregnancy only  . Hypertension     Off meds x 15 years-well controlled now per pt  . Squamous cell cancer, anus ([facility])   . Severe obstructive sleep apnea 06/27/2014  . DVT of upper extremity (deep vein thrombosis) ([place]) 10/14/2014    PAST SURGICAL HISTORY:   Past Surgical History  Procedure Laterality Date  . Foot surgery Right   . Tubal ligation    . Eye surgery Bilateral   . Mouth surgery  2002  . Rectal biopsy N/A 05/08/2014    Procedure: BIOPSY RECTAL;  Surgeon: Marlyce Huge, MD;  Location: [facility] ORS;  Service: General;  Laterality: N/A;  . Evaluation under anesthesia with hemorrhoidectomy N/A 05/08/2014    Procedure: EXAM UNDER ANESTHESIA WITH HEMORRHOIDECTOMY;  Surgeon: Marlyce Huge, MD;  Location: [facility];  Service: General;  Laterality: N/A;  . Portacath placement N/A 05/20/2014    Procedure: INSERTION PORT-A-CATH;  Surgeon: Florene Glen, MD;  Location: [facility] OR[facility];  Service: General;  Laterality: N/A;  . Laparoscopic diverted colostomy N/A 05/26/2014    Procedure: LAPAROSCOPIC DIVERTED COLOSTOMY;  Surgeon: Marlyce Huge, MD;  Location: [facility];  Service: General;  Laterality: N/A;  . Peripheral vascular catheterization Left 10/16/2014    Procedure: Upper Extremity Venography with thrombectomy, port removal;  Surgeon: Algernon Huxley, MD;  Location: [facility];  Service: Cardiovascular;  Laterality: Left;  . Peripheral vascular catheterization  10/16/2014    Procedure: Upper Extremity Intervention;  Surgeon: Algernon Huxley, MD;  Location: [facility];  Service:  Cardiovascular;;  . Video assisted thoracoscopy (vats)/thorocotomy Right 04/10/2015    Procedure: PRE-OP BRONCHOSCOPY, VIDEO ASSISTED THORACOSCOPY RIGHT WITH PLEURAL BIOPSIES, & PLEURAL DESIS;  Surgeon: Nestor Lewandowsky, MD;  Location: [facility];  Service: Thoracic;  Laterality: Right;    SOCIAL HISTORY:   Social History  Substance Use Topics  . Smoking status: Former Smoker -- 0.10 packs/day for 44 years    Types: Cigarettes    Start date: 10/04/1970  . Smokeless tobacco: Never Used  . Alcohol Use: No     Comment: 1 drink every 2-3 times/year    FAMILY HISTORY:   Family History  Problem Relation Age of Onset  . Cancer Maternal Aunt   . Cancer Paternal Uncle   . Diabetes Mother   . Thyroid disease Mother   . Kidney failure Mother   . Hypertension Mother   . Depression Mother   . Mental illness Son   . Aneurysm Maternal Grandmother   . Thyroid disease Maternal Grandmother   . Stroke Maternal Grandfather   . Hypertension Maternal Grandfather   . Diabetes Maternal Grandfather   . Heart disease Maternal Grandfather     MI  . Nephrolithiasis Daughter     DRUG ALLERGIES:   Allergies  Allergen Reactions  . Fish Allergy Anaphylaxis    Patient allergic to perch only. She can eat other fish.  . Sulfa Antibiotics Hives    REVIEW OF SYSTEMS:   Review of Systems  Constitutional: Positive for malaise/fatigue. Negative for fever, weight loss and diaphoresis.  HENT: Negative for ear discharge, ear pain, hearing loss, nosebleeds, sore throat and tinnitus.   Eyes: Negative for blurred vision and pain.  Respiratory: Negative for cough, hemoptysis, shortness of breath and wheezing.   Cardiovascular: Negative for chest pain, palpitations, orthopnea and leg swelling.  Gastrointestinal: Negative for heartburn, nausea, vomiting, abdominal pain, diarrhea, constipation and blood in stool.  Genitourinary: Negative for dysuria, urgency and frequency.  Musculoskeletal: Positive for falls.  Negative for myalgias and back pain.  Skin: Negative for itching and rash.  Neurological: Positive for weakness. Negative for dizziness, tingling, tremors, focal weakness, seizures and headaches.  Psychiatric/Behavioral: Positive for depression. The patient is not nervous/anxious.     MEDICATIONS AT HOME:   Prior to Admission medications   Medication Sig Start Date End Date Taking? Authorizing Provider  albuterol (PROVENTIL) (2.5 MG/3ML) 0.083% nebulizer solution Take 3 mLs (2.5 mg total) by nebulization every 4 (four) hours. Patient taking differently: Take 2.5 mg by nebulization every 4 (four) hours as needed for wheezing or shortness of breath.  02/27/15  Yes Theodoro Grist, MD  ARIPiprazole (ABILIFY) 2 MG tablet Take 1 tablet (2 mg total) by mouth daily. 01/31/15  Yes Marjie Skiff, MD  aspirin EC 81 MG EC tablet Take 1 tablet (81 mg total) by mouth daily. 04/17/15  Yes Demetrios Loll, MD  budesonide-formoterol [facility]) 160-4.5 MCG/ACT inhaler Inhale 2 puffs into the  lungs 2 (two) times daily. 01/30/15  Yes [name], NP  cholecalciferol 1000 units tablet Take 1 tablet (1,000 Units total) by mouth daily. 04/19/15  Yes Hulen Luster, MD  clonazePAM (KLONOPIN) 1 MG tablet Take 1 tablet (1 mg total) by mouth 3 (three) times daily as needed for anxiety. 01/31/15  Yes Marjie Skiff, MD  DULoxetine (CYMBALTA) 60 MG capsule Take 60 mg by mouth every morning.    Yes Historical Provider, MD  escitalopram (LEXAPRO) 20 MG tablet Take 1 tablet (20 mg total) by mouth daily. 01/31/15  Yes Marjie Skiff, MD  ferrous sulfate 325 (65 FE) MG tablet Take 1 tablet (325 mg total) by mouth 2 (two) times daily with a meal. 04/17/15  Yes Demetrios Loll, MD  folic acid (FOLVITE) 1 MG tablet Take 1 tablet (1 mg total) by mouth daily. 03/12/15  Yes Lequita Asal, MD  nicotine (NICODERM CQ - DOSED IN MG/24 HOURS) 21 mg/24hr patch Place 1 patch (21 mg total) onto the skin daily. 03/02/15  Yes Theodoro Grist, MD  nystatin  (MYCOSTATIN) 100000 UNIT/ML suspension Take 5 mLs (500,000 Units total) by mouth 4 (four) times daily. 03/09/15  Yes [name], NP  ondansetron (ZOFRAN) 4 MG tablet Take 1 tablet (4 mg total) by mouth every 6 (six) hours as needed for nausea. 04/19/15  Yes Hulen Luster, MD  oxyCODONE-acetaminophen (PERCOCET/ROXICET) 5-325 MG tablet Take 1 tablet by mouth every 8 (eight) hours as needed for moderate pain. 04/17/15  Yes Demetrios Loll, MD  pantoprazole (PROTONIX) 40 MG tablet Take 40 mg by mouth 2 (two) times daily.    Yes Historical Provider, MD  polyethylene glycol (MIRALAX / GLYCOLAX) packet Take 17 g by mouth daily. 03/02/15  Yes Theodoro Grist, MD  senna-docusate (SENOKOT-S) 8.6-50 MG tablet Take 1 tablet by mouth at bedtime as needed for mild constipation. 02/27/15  Yes Theodoro Grist, MD  tiotropium (SPIRIVA) 18 MCG inhalation capsule Place 1 capsule (18 mcg total) into inhaler and inhale daily. 02/26/15  Yes [name], NP  traZODone (DESYREL) 50 MG tablet Take 1 tablet (50 mg total) by mouth at bedtime as needed for sleep. 04/03/15  Yes [name], NP  zolpidem (AMBIEN) 10 MG tablet Take 1 tablet (10 mg total) by mouth at bedtime as needed for sleep. 01/31/15  Yes Marjie Skiff, MD  baclofen (LIORESAL) 10 MG tablet Take 1 tablet (10 mg total) by mouth 3 (three) times daily. Patient not taking: Reported on 04/22/2015 02/09/15   [name], NP  ciprofloxacin (CIPRO) 500 MG tablet Take 1 tablet (500 mg total) by mouth 2 (two) times daily. Patient taking differently: Take 500 mg by mouth 2 (two) times daily.  04/21/15   Nicholes Mango, MD  pregabalin (LYRICA) 200 MG capsule Take 1 capsule (200 mg total) by mouth 2 (two) times daily as needed. Patient not taking: Reported on 04/22/2015 03/18/15   Gladstone Lighter, MD      VITAL SIGNS:  Blood pressure 137/74, pulse 82, temperature 98.3 F (36.8 C), temperature source Oral, resp. rate 20, height 5' 6.5" (1.689 m), weight 107.23 kg (236 lb 6.4 oz), SpO2 92  %.  PHYSICAL EXAMINATION:  Physical Exam  Constitutional: She is oriented to person, place, and time and well-developed, well-nourished, and in no distress.  HENT:  Head: Normocephalic and atraumatic.  Eyes: Conjunctivae and EOM are normal. Pupils are equal, round, and reactive to light.  Neck: Normal range of motion. Neck supple. No tracheal deviation  present. No thyromegaly present.  Cardiovascular: Normal rate, regular rhythm and normal heart sounds.   Pulmonary/Chest: Effort normal and breath sounds normal. No respiratory distress. She has no wheezes. She exhibits no tenderness.  Abdominal: Soft. Bowel sounds are normal. She exhibits no distension. There is no tenderness.  Colostomy tube in place draining liquid stool.  don't notice any signs of erythema or leakage around colostomy site  Musculoskeletal: Normal range of motion.  Neurological: She is alert and oriented to person, place, and time. No cranial nerve deficit.  Skin: Skin is warm and dry. No rash noted.  Psychiatric: Mood and affect normal.   LABORATORY PANEL:   CBC  Recent Labs Lab 04/22/15 2102  WBC 10.6  HGB 9.3*  HCT 28.9*  PLT 248   ------------------------------------------------------------------------------------------------------------------  Chemistries   Recent Labs Lab 04/16/15 0540  04/22/15 2102  NA 135  < > 137  K 3.3*  < > 3.3*  CL 104  < > 106  CO2 27  < > 28  GLUCOSE 107*  < > 93  BUN 13  < > <5*  CREATININE 0.79  < > 0.76  CALCIUM 7.7*  < > 8.1*  MG 1.7  --   --   AST  --   < > 32  ALT  --   < > 10*  ALKPHOS  --   < > 92  BILITOT  --   < > 0.5  < > = values in this interval not displayed. ------------------------------------------------------------------------------------------------------------------  Cardiac Enzymes  Recent Labs Lab 04/17/15 2226  TROPONINI <0.03    ------------------------------------------------------------------------------------------------------------------  RADIOLOGY:  Dg Abd Acute W/chest  04/22/2015  CLINICAL DATA:  Abdominal pain. Recent hospitalization and talc pleurodesis. EXAM: DG ABDOMEN ACUTE W/ 1V CHEST COMPARISON:  Most recent chest radiograph 04/17/2015. CT abdomen/pelvis 03/15/2015 FINDINGS: Low lung volumes with bilateral pleural effusions and bibasilar opacities. Pleural effusion on the left is increased from prior exam. Cardiomediastinal contours are unchanged. No pneumothorax. Prominent air-filled central small bowel loops measuring up to 3.5 cm in diameter. Mild gaseous gastric distention. No evidence of free air. Bilateral tubal ligation clips. No evidence of acute osseous abnormality. IMPRESSION: 1. Mild gaseous distention of bowel loops in the central abdomen, in a pattern suggestive of enteritis or ileus. There is no evidence of obstruction. 2. Bilateral pleural effusions and bibasilar opacities. The left pleural effusion has increased from chest radiograph 5 days prior. Right pleural thickening/effusion is unchanged. Electronically Signed   By: Jeb Levering M.D.   On: 04/22/2015 20:46   IMPRESSION AND PLAN:  60 year old female with recent discharge on 4/15 is being readmitted for generalized weakness, likely will require skilled nursing facility placement, please note this is her sixth admission in last 6 month, third admission within last 2 weeks  * Generalized Weakness, likely multifactorial - We will consult physical and occupational therapy - Care management and social worker consult for possible placement - Consult [facility] care management due to multiple readmissions - sixth admission in last 6 month, third admission within last 2 weeks  * Hypokalemia - Replete and recheck  * Recent Escherichia coli UTI - Continue ciprofloxacin for total 4 more days to finish course  * Anxiety/depression/bipolar  disorder - Continue home medication  * Recent admission for GI bleed - EGD and colonoscopy on last admission showing diverticulosis only and stable hemodynamics since then    All the records are reviewed and case discussed with ED provider. Management plans discussed with the patient, family  and they are in agreement.  CODE STATUS: Full code  TOTAL TIME TAKING CARE OF THIS PATIENT: 45 minutes.    [facility], Milo Schreier M.D on 04/22/2015 at 11:08 PM  Between 7am to 6pm - Pager - [phone]  After 6pm go to [email or web address] - password [email or web address] [facility]  Office  [phone]  CC: Primary care physician; [name], NP   Note: This dictation was prepared with Dragon dictation along with smaller phrase technology. Any transcriptional errors that result from this process are unintentional.

## 2015-04-22 NOTE — ED Notes (Signed)
Pt presents to ED via ems from home with c/o abdominal pain, shoulder , back and axillary. Pt was recently discharged for pneumonia.

## 2015-04-23 ENCOUNTER — Encounter
Admission: RE | Admit: 2015-04-23 | Discharge: 2015-04-23 | Disposition: A | Discharge status: Home / Self Care | Payer: Commercial Managed Care - HMO | Source: Ambulatory Visit | Attending: Internal Medicine | Admitting: Internal Medicine

## 2015-04-23 ENCOUNTER — Encounter: Payer: Self-pay | Admitting: *Deleted

## 2015-04-23 DIAGNOSIS — D649 Anemia, unspecified: Secondary | ICD-10-CM | POA: Insufficient documentation

## 2015-04-23 DIAGNOSIS — E876 Hypokalemia: Secondary | ICD-10-CM | POA: Insufficient documentation

## 2015-04-23 DIAGNOSIS — R531 Weakness: Secondary | ICD-10-CM | POA: Diagnosis not present

## 2015-04-23 LAB — CBC
HEMATOCRIT: 25.2 % — AB (ref 35.0–47.0)
HEMOGLOBIN: 8.3 g/dL — AB (ref 12.0–16.0)
MCH: 29.2 pg (ref 26.0–34.0)
MCHC: 32.8 g/dL (ref 32.0–36.0)
MCV: 89 fL (ref 80.0–100.0)
Platelets: 195 10*3/uL (ref 150–440)
RBC: 2.84 MIL/uL — AB (ref 3.80–5.20)
RDW: 18.1 % — AB (ref 11.5–14.5)
WBC: 8.5 10*3/uL (ref 3.6–11.0)

## 2015-04-23 LAB — BASIC METABOLIC PANEL
ANION GAP: 3 — AB (ref 5–15)
BUN: <5 mg/dL — ABNORMAL LOW (ref 6–20)
CHLORIDE: 109 mmol/L (ref 101–111)
CO2: 25 mmol/L (ref 22–32)
CREATININE: 0.72 mg/dL (ref 0.44–1.00)
Calcium: 7.6 mg/dL — ABNORMAL LOW (ref 8.9–10.3)
GFR calc non Af Amer: >60 mL/min (ref >60)
Glucose, Bld: 84 mg/dL (ref 65–99)
POTASSIUM: 3.2 mmol/L — AB (ref 3.5–5.1)
SODIUM: 137 mmol/L (ref 135–145)

## 2015-04-23 LAB — GLUCOSE, CAPILLARY: GLUCOSE-CAPILLARY: 72 mg/dL (ref 65–99)

## 2015-04-23 MED ORDER — ACETAMINOPHEN 325 MG PO TABS
650.0000 mg | ORAL_TABLET | Freq: Four times a day (QID) | ORAL | Status: DC | PRN
Start: 2015-04-23 — End: 2015-04-24

## 2015-04-23 MED ORDER — DOCUSATE SODIUM 100 MG PO CAPS
100.0000 mg | ORAL_CAPSULE | Freq: Two times a day (BID) | ORAL | Status: DC
  Administered 2015-04-23 (×2): 100 mg via ORAL
  Filled 2015-04-23 (×3): qty 1

## 2015-04-23 MED ORDER — MOMETASONE FURO-FORMOTEROL FUM 200-5 MCG/ACT IN AERO
2.0000 | INHALATION_SPRAY | Freq: Two times a day (BID) | RESPIRATORY_TRACT | Status: DC
  Administered 2015-04-23 – 2015-04-24 (×3): 2 via RESPIRATORY_TRACT
  Filled 2015-04-23: qty 8.8

## 2015-04-23 MED ORDER — NICOTINE 21 MG/24HR TD PT24
21.0000 mg | MEDICATED_PATCH | Freq: Every day | TRANSDERMAL | Status: DC
  Administered 2015-04-23 – 2015-04-24 (×2): 21 mg via TRANSDERMAL
  Filled 2015-04-23 (×2): qty 1

## 2015-04-23 MED ORDER — CLONAZEPAM 1 MG PO TABS: 1.0000 mg | ORAL_TABLET | Freq: Three times a day (TID) | ORAL | Status: DC | PRN

## 2015-04-23 MED ORDER — POTASSIUM CHLORIDE CRYS ER 20 MEQ PO TBCR
40.0000 meq | EXTENDED_RELEASE_TABLET | Freq: Once | ORAL | Status: AC
  Administered 2015-04-23: 40 meq via ORAL
  Filled 2015-04-23: qty 2

## 2015-04-23 MED ORDER — SODIUM CHLORIDE 0.9 % IV SOLN
INTRAVENOUS | Status: DC
  Administered 2015-04-23 – 2015-04-24 (×3): via INTRAVENOUS

## 2015-04-23 MED ORDER — ACETAMINOPHEN 650 MG RE SUPP: 650.0000 mg | Freq: Four times a day (QID) | RECTAL | Status: DC | PRN

## 2015-04-23 MED ORDER — SENNOSIDES-DOCUSATE SODIUM 8.6-50 MG PO TABS: 1.0000 | ORAL_TABLET | Freq: Every evening | ORAL | Status: DC | PRN

## 2015-04-23 MED ORDER — BACLOFEN 10 MG PO TABS
10.0000 mg | ORAL_TABLET | Freq: Three times a day (TID) | ORAL | Status: DC
  Administered 2015-04-23: 10 mg via ORAL
  Filled 2015-04-23 (×3): qty 1

## 2015-04-23 MED ORDER — FOLIC ACID 1 MG PO TABS
1.0000 mg | ORAL_TABLET | Freq: Every day | ORAL | Status: DC
  Administered 2015-04-23 – 2015-04-24 (×2): 1 mg via ORAL
  Filled 2015-04-23 (×2): qty 1

## 2015-04-23 MED ORDER — BISACODYL 5 MG PO TBEC
5.0000 mg | DELAYED_RELEASE_TABLET | Freq: Every day | ORAL | Status: DC | PRN
Start: 2015-04-23 — End: 2015-04-24

## 2015-04-23 MED ORDER — CIPROFLOXACIN HCL 500 MG PO TABS
500.0000 mg | ORAL_TABLET | Freq: Two times a day (BID) | ORAL | Status: DC
Start: 2015-04-23 — End: 2015-04-24
  Administered 2015-04-23 – 2015-04-24 (×3): 500 mg via ORAL
  Filled 2015-04-23 (×3): qty 1

## 2015-04-23 MED ORDER — POLYETHYLENE GLYCOL 3350 17 G PO PACK
17.0000 g | PACK | Freq: Every day | ORAL | Status: DC
  Filled 2015-04-23 (×2): qty 1

## 2015-04-23 MED ORDER — ASPIRIN EC 81 MG PO TBEC
81.0000 mg | DELAYED_RELEASE_TABLET | Freq: Every day | ORAL | Status: DC
  Administered 2015-04-23 – 2015-04-24 (×2): 81 mg via ORAL
  Filled 2015-04-23 (×2): qty 1

## 2015-04-23 MED ORDER — TIOTROPIUM BROMIDE MONOHYDRATE 18 MCG IN CAPS
18.0000 ug | ORAL_CAPSULE | Freq: Every day | RESPIRATORY_TRACT | Status: DC
  Administered 2015-04-23 – 2015-04-24 (×2): 18 ug via RESPIRATORY_TRACT
  Filled 2015-04-23: qty 5

## 2015-04-23 MED ORDER — PANTOPRAZOLE SODIUM 40 MG PO TBEC
40.0000 mg | DELAYED_RELEASE_TABLET | Freq: Two times a day (BID) | ORAL | Status: DC
  Administered 2015-04-23 – 2015-04-24 (×3): 40 mg via ORAL
  Filled 2015-04-23 (×3): qty 1

## 2015-04-23 MED ORDER — DULOXETINE HCL 30 MG PO CPEP
60.0000 mg | ORAL_CAPSULE | ORAL | Status: DC
  Administered 2015-04-23 – 2015-04-24 (×2): 60 mg via ORAL
  Filled 2015-04-23 (×2): qty 2

## 2015-04-23 MED ORDER — VITAMIN D 1000 UNITS PO TABS
1000.0000 [IU] | ORAL_TABLET | Freq: Every day | ORAL | Status: DC
  Administered 2015-04-23 – 2015-04-24 (×2): 1000 [IU] via ORAL
  Filled 2015-04-23 (×2): qty 1

## 2015-04-23 MED ORDER — NYSTATIN 100000 UNIT/ML MT SUSP
5.0000 mL | Freq: Four times a day (QID) | OROMUCOSAL | Status: DC
  Administered 2015-04-23 (×3): 500000 [IU] via ORAL
  Filled 2015-04-23 (×5): qty 5

## 2015-04-23 MED ORDER — ARIPIPRAZOLE 2 MG PO TABS
2.0000 mg | ORAL_TABLET | Freq: Every day | ORAL | Status: DC
  Administered 2015-04-23 – 2015-04-24 (×2): 2 mg via ORAL
  Filled 2015-04-23 (×2): qty 1

## 2015-04-23 MED ORDER — ESCITALOPRAM OXALATE 10 MG PO TABS
20.0000 mg | ORAL_TABLET | Freq: Every day | ORAL | Status: DC
  Administered 2015-04-23 – 2015-04-24 (×2): 20 mg via ORAL
  Filled 2015-04-23 (×2): qty 2

## 2015-04-23 MED ORDER — ONDANSETRON HCL 4 MG PO TABS: 4.0000 mg | ORAL_TABLET | Freq: Four times a day (QID) | ORAL | Status: DC | PRN

## 2015-04-23 MED ORDER — ZOLPIDEM TARTRATE 5 MG PO TABS
10.0000 mg | ORAL_TABLET | Freq: Every evening | ORAL | Status: DC | PRN
Start: 2015-04-23 — End: 2015-04-24

## 2015-04-23 MED ORDER — FERROUS SULFATE 325 (65 FE) MG PO TABS
325.0000 mg | ORAL_TABLET | Freq: Two times a day (BID) | ORAL | Status: DC
  Administered 2015-04-23 – 2015-04-24 (×3): 325 mg via ORAL
  Filled 2015-04-23 (×3): qty 1

## 2015-04-23 MED ORDER — TRAZODONE HCL 50 MG PO TABS: 50.0000 mg | ORAL_TABLET | Freq: Every evening | ORAL | Status: DC | PRN

## 2015-04-23 NOTE — Clinical Social Work Note (Signed)
Bed offers extended and patient has chosen [facility]. [facility] notified. Shela Leff MSW,LCSW [phone]

## 2015-04-23 NOTE — Progress Notes (Signed)
[facility] [place] NAME: [name]    MR#:  [id number]  DATE OF BIRTH:  [birth year]  SUBJECTIVE:  CHIEF COMPLAINT:   Chief Complaint  Patient presents with  . Abdominal Pain   Back pain and weakness. REVIEW OF SYSTEMS:  CONSTITUTIONAL: No fever, has weakness.  EYES: No blurred or double vision.  EARS, NOSE, AND THROAT: No tinnitus or ear pain.  RESPIRATORY: No cough, shortness of breath, wheezing or hemoptysis.  CARDIOVASCULAR: No chest pain, orthopnea, edema.  GASTROINTESTINAL: No nausea, vomiting, diarrhea or abdominal pain.  GENITOURINARY: No dysuria, hematuria.  ENDOCRINE: No polyuria, nocturia,  HEMATOLOGY: No anemia, easy bruising or bleeding SKIN: No rash or lesion. MUSCULOSKELETAL: Back pain. NEUROLOGIC: No tingling, numbness, weakness.  PSYCHIATRY: No anxiety or depression.   DRUG ALLERGIES:   Allergies  Allergen Reactions  . Fish Allergy Anaphylaxis    Patient allergic to perch only. She can eat other fish.  . Sulfa Antibiotics Hives    VITALS:  Blood pressure 106/58, pulse 81, temperature 98.4 F (36.9 C), temperature source Oral, resp. rate 17, height 5' 6.5" (1.689 m), weight 103.42 kg (228 lb), SpO2 95 %.  PHYSICAL EXAMINATION:  GENERAL:  60 y.o.-year-old patient lying in the bed with no acute distress. Morbid obese. EYES: Pupils equal, round, reactive to light and accommodation. No scleral icterus. Extraocular muscles intact.  HEENT: Head atraumatic, normocephalic. Oropharynx and nasopharynx clear.  NECK:  Supple, no jugular venous distention. No thyroid enlargement, no tenderness.  LUNGS: Normal breath sounds bilaterally, no wheezing, rales,rhonchi or crepitation. No use of accessory muscles of respiration.  CARDIOVASCULAR: S1, S2 normal. No murmurs, rubs, or gallops.  ABDOMEN: Soft, nontender, nondistended. Bowel sounds present. No organomegaly or mass.  EXTREMITIES: No pedal edema, cyanosis,  or clubbing.  NEUROLOGIC: Cranial nerves II through XII are intact. Muscle strength -34/5 in all extremities. Sensation intact. Gait not checked.  PSYCHIATRIC: The patient is alert and oriented x 3.  SKIN: No obvious rash, lesion, or ulcer.    LABORATORY PANEL:   CBC  Recent Labs Lab 04/23/15 0527  WBC 8.5  HGB 8.3*  HCT 25.2*  PLT 195   ------------------------------------------------------------------------------------------------------------------  Chemistries   Recent Labs Lab 04/22/15 2102 04/23/15 0527  NA 137 137  K 3.3* 3.2*  CL 106 109  CO2 28 25  GLUCOSE 93 84  BUN <5* <5*  CREATININE 0.76 0.72  CALCIUM 8.1* 7.6*  AST 32  --   ALT 10*  --   ALKPHOS 92  --   BILITOT 0.5  --    ------------------------------------------------------------------------------------------------------------------  Cardiac Enzymes  Recent Labs Lab 04/17/15 2226  TROPONINI <0.03   ------------------------------------------------------------------------------------------------------------------  RADIOLOGY:  Dg Abd Acute W/chest  04/22/2015  CLINICAL DATA:  Abdominal pain. Recent hospitalization and talc pleurodesis. EXAM: DG ABDOMEN ACUTE W/ 1V CHEST COMPARISON:  Most recent chest radiograph 04/17/2015. CT abdomen/pelvis 03/15/2015 FINDINGS: Low lung volumes with bilateral pleural effusions and bibasilar opacities. Pleural effusion on the left is increased from prior exam. Cardiomediastinal contours are unchanged. No pneumothorax. Prominent air-filled central small bowel loops measuring up to 3.5 cm in diameter. Mild gaseous gastric distention. No evidence of free air. Bilateral tubal ligation clips. No evidence of acute osseous abnormality. IMPRESSION: 1. Mild gaseous distention of bowel loops in the central abdomen, in a pattern suggestive of enteritis or ileus. There is no evidence of obstruction. 2. Bilateral pleural effusions and bibasilar opacities. The left pleural effusion  has increased from  chest radiograph 5 days prior. Right pleural thickening/effusion is unchanged. Electronically Signed   By: Jeb Levering M.D.   On: 04/22/2015 20:46    EKG:   Orders placed or performed during the hospital encounter of 04/17/15  . EKG 12-Lead  . EKG 12-Lead   *Note: Due to a large number of results and/or encounters for the requested time period, some results have not been displayed. A complete set of results can be found in Results Review.    ASSESSMENT AND PLAN:   60 year old female with recent discharge on 4/15 is being readmitted for generalized weakness, likely will require skilled nursing facility placement, please note this is her sixth admission in last 6 month, third admission within last 2 weeks  * Generalized Weakness, likely multifactorial PT: SNF. She agrees.  * Hypokalemia - Repleted and f/u BMP.  * Recent Escherichia coli UTI - Continue ciprofloxacin for total 4 more days to finish course  * Anxiety/depression/bipolar disorder - Continue home medication  * Recent admission for GI bleed - EGD and colonoscopy on last admission showing diverticulosis only.  Back pain. Chronic. She wants oxycodone 10-325 mg.  All the records are reviewed and case discussed with Care Management/Social Workerr. Management plans discussed with the patient, family and they are in agreement.  CODE STATUS: full code.  TOTAL TIME TAKING CARE OF THIS PATIENT: 32 minutes.  Greater than 50% time was spent on coordination of care and face-to-face counseling.  POSSIBLE D/C IN 1 DAYS, DEPENDING ON CLINICAL CONDITION.   Demetrios Loll M.D on 04/23/2015 at 4:48 PM  Between 7am to 6pm - Pager - [phone]  After 6pm go to [email or web address] - password [email or web address] [place] [facility]  Office  [phone]  CC: Primary care physician; [name], NP

## 2015-04-23 NOTE — Evaluation (Signed)
Occupational Therapy Evaluation Patient Details Name: [name] MRN: [id number] DOB: 1955/01/20 Today's Date: 04/23/2015    History of Present Illness 60 y.o. female recently discharged from hospital on 4/15 presented to ED again on 4/16 for abdominal, shoulder, back and axillary pain. PMH of cancer, COPD, bipolar disorder, PTSD, UE DVT, and a colostomy (05-26-14).  Pt presented with SOB and right pleural effusion on previous admission.  Pt was admitted for COPD exacerbation.  Pt is s/p thoracoscopy with chest tube placement (04-10-15)     Clinical Impression  Pt. Is a 60 y.o. Female who has had multiple hospitalizations over the past several months for weakness and CA. Pt. Presents with decreased activity tolerance, decreased strength, and impaired endurance which hinders her ability to complete ADL and IADL functioning. Pt. Could benefit from skilled OT services to improve ADL and IADL tasks.    Follow Up Recommendations  SNF    Equipment Recommendations       Recommendations for Other Services       Precautions / Restrictions Precautions Precautions: Fall Precaution Comments: L LQ colostomy Restrictions Weight Bearing Restrictions: No      Mobility Bed Mobility Overal bed mobility: Needs Assistance Bed Mobility: Supine to Sit     Supine to sit: Min guard     General bed mobility comments: uses rails  Transfers   Balance                          ADL Overall ADL's : Needs assistance/impaired Eating/Feeding: Set up        Grooming: Set up; min assist.     Upper Body Dressing : Min guard (gown management)   Lower Body Dressing: Maximal assistance            SO2: 94% on 2 LO2  HR: 74 bpms            Vision     Perception     Praxis      Pertinent Vitals/Pain Pain Assessment: 0-10 Pain Score: 8  Pain Location: abdomin, Left shoulder, and back Pain Descriptors / Indicators: Aching Pain Intervention(s): Limited activity  within patient's tolerance;Monitored during session     Hand Dominance     Extremity/Trunk Assessment Upper Extremity Assessment Upper Extremity Assessment: Generalized weakness   Lower Extremity Assessment Lower Extremity Assessment: Generalized weakness       Communication Communication Communication: No difficulties   Cognition Arousal/Alertness: Awake/alert Behavior During Therapy: WFL for tasks assessed/performed Overall Cognitive Status: Within Functional Limits for tasks assessed                     General Comments       Exercises Exercises: Other exercises Other Exercises Other Exercises: B LE supine: ankle pumps and QS x 20 each, heel slides and abd slides x 10 each. Fatigues quickly and requires therapeutic rest breaks for energy conservation. Cues for proper technique.    Shoulder Instructions      Home Living Family/patient expects to be discharged to:: Skilled nursing facility Living Arrangements: Other relatives Available Help at Discharge: Family   Home Access: Stairs to enter Entrance Stairs-Number of Steps: 3 Entrance Stairs-Rails: None Home Layout: One level     Bathroom Shower/Tub: Teacher, early years/pre: Standard     Home Equipment: Environmental consultant - 2 wheels          Prior Functioning/Environment Level of Independence: Independent with assistive device(s)  Comments: Used a FWW, and was Independent with ADLs.    OT Diagnosis: Generalized weakness   OT Problem List: Decreased strength;Decreased activity tolerance;Impaired UE functional use;Decreased safety awareness;Decreased knowledge of use of DME or AE;Impaired balance (sitting and/or standing)   OT Treatment/Interventions: Self-care/ADL training;Therapeutic exercise;Energy conservation;DME and/or AE instruction;Therapeutic activities;Patient/family education    OT Goals(Current goals can be found in the care plan section) Acute Rehab OT Goals Patient Stated  Goal: To get better OT Goal Formulation: With patient  OT Frequency: Min 1X/week   Barriers to D/C:            Co-evaluation              End of Session    Activity Tolerance: Patient tolerated treatment well Patient left: in bed;with call bell/phone within reach;with bed alarm set   Time: 1115-1140 OT Time Calculation (min): 25 min Charges:  OT General Charges $OT Visit: 1 Procedure OT Evaluation $OT Eval Moderate Complexity: 1 Procedure G-Codes:    Harrel Carina, MS, OTR/L Harrel Carina 04/23/2015, 12:23 PM

## 2015-04-23 NOTE — Clinical Social Work Note (Signed)
Clinical Social Work Assessment  Patient Details  Name: [name] MRN: [id number] Date of Birth: 1955-01-27  Date of referral:  04/23/15               Reason for consult:  Facility Placement                Permission sought to share information with:  Facility Art therapist granted to share information::  Yes, Verbal Permission Granted  Name::        Agency::     Relationship::     Contact Information:     Housing/Transportation Living arrangements for the past 2 months:  Single Family Home Source of Information:  Patient Patient Interpreter Needed:  None Criminal Activity/Legal Involvement Pertinent to Current Situation/Hospitalization:    Significant Relationships:  Friend Lives with:  Friends Do you feel safe going back to the place where you live?  Yes Need for family participation in patient care:  Yes (Comment)  Care giving concerns:  Patient resides with a life long friend and her friend's husband.   Social Worker assessment / plan:  CSW informed by PT that patient is recommended for STR. Patient is a readmit from last week in which patient declined STR at that time and went home. Patient is now accepting of STR and admits that she knows that she cannot move around as she would like and cannot rely on her friend or friend's husband to help as they have their own health issues. Patient is agreeable to a bedsearch.  Employment status:  Disabled (Comment on whether or not currently receiving Disability) Insurance information:  Managed Medicare PT Recommendations:  [place] / Referral to community resources:  [place]  Patient/Family's Response to care:  Patient expressed appreciation for CSW assistance.  Patient/Family's Understanding of and Emotional Response to Diagnosis, Current Treatment, and Prognosis:  Patient has insight that she needs rehab at this time.  Emotional Assessment Appearance:   Appears stated age Attitude/Demeanor/Rapport:   (pleasant and cooperative) Affect (typically observed):  Accepting, Calm, Adaptable Orientation:  Oriented to Self, Oriented to Place, Oriented to  Time, Oriented to Situation Alcohol / Substance use:  Not Applicable Psych involvement (Current and /or in the community):  No (Comment)  Discharge Needs  Concerns to be addressed:  Care Coordination Readmission within the last 30 days:  Yes Current discharge risk:  None Barriers to Discharge:  No Barriers Identified   Shela Leff, LCSW 04/23/2015, 11:18 AM

## 2015-04-23 NOTE — Evaluation (Addendum)
Physical Therapy Evaluation Patient Details Name: [name] MRN: [id number] DOB: [birth year] Today's Date: 04/23/2015   History of Present Illness  60 y.o. female recently discharged from hospital on 4/15 presented to ED again on 4/16 for abdominal, shoulder, back and axillary pain. PMH of cancer, COPD, bipolar disorder, PTSD, UE DVT, and a colostomy (05-26-14).  Pt presented with SOB and right pleural effusion on previous admission.  Pt was admitted for COPD exacerbation.  Pt is s/p thoracoscopy with chest tube placement (04-10-15)  Clinical Impression  Pt demonstrated generalized weakness and difficulty walking after multiple hospitalizations recently. She has had 6 admissions in the past 6 months and 3 admissions in the past 2 weeks. She requires min guard for bed mobility. Transfers and ambulation requires min A to min guard with FWW and cues for technique and safety. She fatigues quickly with minimal activity with SpO2 ranging 89 to 92% on 2 L. Cues for pursed lip breathing given during session. After hospital discharge, STR is recommended to increased functional I and progress to PLOF. Pt is in agreement that she is unable to return home at this time. Pt will benefit from skilled PT services to increase functional I and mobility for safe discharge.     Follow Up Recommendations SNF    Equipment Recommendations  None recommended by PT (pt reports she has FWW)    Recommendations for Other Services       Precautions / Restrictions Precautions Precautions: Fall Precaution Comments: L LQ colostomy Restrictions Weight Bearing Restrictions: No      Mobility  Bed Mobility Overal bed mobility: Needs Assistance Bed Mobility: Supine to Sit     Supine to sit: Min guard     General bed mobility comments: uses rails  Transfers Overall transfer level: Needs assistance Equipment used: Rolling walker (2 wheeled) Transfers: Sit to/from [facility] Sit to Stand: Min  assist Stand pivot transfers: Min guard       General transfer comment: Slow to rise/sit  Ambulation/Gait Ambulation/Gait assistance: Min guard;+2 safety/equipment Ambulation Distance (Feet): 30 Feet Assistive device: Rolling walker (2 wheeled) Gait Pattern/deviations: Decreased stride length;Trunk flexed Gait velocity: decreased    General Gait Details: slow speed, SOB, cues for staying close to [employer]            Wheelchair Mobility    Modified Rankin (Stroke Patients Only)       Balance Overall balance assessment: Needs assistance;History of Falls Sitting-balance support: Feet supported;Bilateral upper extremity supported Sitting balance-Leahy Scale: Good     Standing balance support: Bilateral upper extremity supported Standing balance-Leahy Scale: Fair Standing balance comment: static balance steady with no LOB, slightly unsteady with dynamic balance                             Pertinent Vitals/Pain Pain Assessment: 0-10 Pain Score: 8  Pain Location: abdomin, L shoulder and back Pain Descriptors / Indicators: Aching Pain Intervention(s): Limited activity within patient's tolerance;Monitored during session    Home Living Family/patient expects to be discharged to:: Skilled nursing facility Living Arrangements: Other relatives Available Help at Discharge: Family   Home Access: Stairs to enter Entrance Stairs-Rails: None Entrance Stairs-Number of Steps: 3 Home Layout: One level Home Equipment: Walker - 2 wheels      Prior Function Level of Independence: Independent with assistive device(s)         Comments: Pt ambulated limited distances with FWW and I with  ADLs.     Hand Dominance        Extremity/Trunk Assessment   Upper Extremity Assessment: Generalized weakness           Lower Extremity Assessment: Generalized weakness         Communication   Communication: No difficulties  Cognition Arousal/Alertness:  Awake/alert Behavior During Therapy: WFL for tasks assessed/performed Overall Cognitive Status: Within Functional Limits for tasks assessed                      General Comments General comments (skin integrity, edema, etc.): L LQ colostomy    Exercises Other Exercises Other Exercises: B LE supine: ankle pumps and QS x 20 each, heel slides and abd slides x 10 each. Fatigues quickly and requires therapeutic rest breaks for energy conservation. Cues for proper technique.       Assessment/Plan    PT Assessment Patient needs continued PT services  PT Diagnosis Difficulty walking;Generalized weakness   PT Problem List Decreased strength;Decreased range of motion;Decreased activity tolerance;Decreased mobility;Pain  PT Treatment Interventions DME instruction;Gait training;Functional mobility training;Therapeutic activities;Therapeutic exercise;Patient/family education   PT Goals (Current goals can be found in the Care Plan section) Acute Rehab PT Goals Patient Stated Goal: to get better PT Goal Formulation: With patient Time For Goal Achievement: 05/07/15 Potential to Achieve Goals: Fair    Frequency Min 2X/week   Barriers to discharge Inaccessible home environment;Decreased caregiver support no physical help at home, stairs to enter    Co-evaluation               End of Session Equipment Utilized During Treatment: Oxygen;Gait belt Activity Tolerance: Patient limited by fatigue Patient left: in chair;with call bell/phone within reach;with chair alarm set Nurse Communication: Mobility status    Functional Assessment Tool Used: clinical judgement, gait speed Functional Limitation: Mobility: Walking and moving around Mobility: Walking and Moving Around Current Status (G6269): At least 40 percent but less than 60 percent impaired, limited or restricted Mobility: Walking and Moving Around Goal Status [phone]): At least 1 percent but less than 20 percent impaired,  limited or restricted    Time: 0915-0942 PT Time Calculation (min) (ACUTE ONLY): 27 min   Charges:   PT Evaluation $PT Eval Moderate Complexity: 1 Procedure PT Treatments $Therapeutic Exercise: 8-22 mins   PT G Codes:   PT G-Codes **NOT FOR INPATIENT CLASS** Functional Assessment Tool Used: clinical judgement, gait speed Functional Limitation: Mobility: Walking and moving around Mobility: Walking and Moving Around Current Status (E7035): At least 40 percent but less than 60 percent impaired, limited or restricted Mobility: Walking and Moving Around Goal Status [phone]): At least 1 percent but less than 20 percent impaired, limited or restricted    Neoma Laming, PT, DPT  04/23/2015, 11:05 AM [phone]

## 2015-04-23 NOTE — NC FL2 (Signed)
[place] LEVEL OF CARE SCREENING TOOL     IDENTIFICATION  Patient Name: [name] Birthdate: [birth year] Sex: female Admission Date (Current Location): 04/22/2015  [facility] and [place] Number:  Engineering geologist and Address:  [facility], [address], [place], [place] [address]      Provider Number: [id number]  Attending Physician Name and Address:  Demetrios Loll, MD  Relative Name and Phone Number:       Current Level of Care: Hospital Recommended Level of Care: [place] Prior Approval Number:    Date Approved/Denied:   PASRR Number:    Discharge Plan: SNF    Current Diagnoses: Patient Active Problem List   Diagnosis Date Noted  . Generalized weakness 04/22/2015  . Recurrent right pleural effusion 04/08/2015  . Hospital discharge follow-up 04/03/2015  . Recurrent pneumonia   . RUQ abdominal pain   . Sepsis ([place]) 03/15/2015  . CAP (community acquired pneumonia) 03/15/2015  . Pyrexia   . Coarse tremors 03/14/2015  . Insomnia 03/14/2015  . Poor mobility 03/14/2015  . HCAP (healthcare-associated pneumonia) 03/05/2015  . 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 GI bleed 03/02/2015  . SBO (small bowel obstruction) ([place])   . Anemia   . COPD exacerbation ([place]) 02/27/2015  . Aspiration pneumonitis ([place]) 02/27/2015  . Small bowel obstruction ([place]) 02/27/2015  . Hyperglycemia 02/27/2015  . Chronic pain syndrome 02/27/2015  . COPD (chronic obstructive pulmonary disease) ([place]) 02/26/2015  . COPD, very severe ([place]) 12/12/2014  . Fibromyalgia 12/12/2014  . DVT of upper extremity (deep vein thrombosis) ([place]) 10/14/2014  . B12 deficiency 09/28/2014  . Folate deficiency 09/28/2014  . Macrocytosis 09/21/2014  . Depression   . Hypokalemia 07/20/2014  . Anal cancer ([place]) 06/27/2014  . Anal fissure 06/27/2014  . Anxiety 06/27/2014  . Airway hyperreactivity 06/27/2014  . H/O  manic depressive disorder 06/27/2014  . CAFL (chronic airflow limitation) ([place]) 06/27/2014  . Clinical depression 06/27/2014  . Deep vein thrombosis ([place]) 06/27/2014  . External hemorrhoid 06/27/2014  . Myalgia and myositis 06/27/2014  . GERD (gastroesophageal reflux disease) 06/27/2014  . Hemorrhoid 06/27/2014  . HTN (hypertension) 06/27/2014  . HLD (hyperlipidemia) 06/27/2014  . Adult hypothyroidism 06/27/2014  . LBP (low back pain) 06/27/2014  . Mass of perianal area 06/27/2014  . External hemorrhoid, thrombosed 06/27/2014  . 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 Ulcerated hemorrhoid 06/27/2014  . Severe obstructive sleep apnea 06/27/2014  . Squamous cell cancer, anus (HCC)   . Rectal ulcer 05/17/2014    Orientation RESPIRATION BLADDER Height & Weight     Self, Time, Situation, Place  Normal, O2 (2 liters) Continent Weight: 228 lb (103.42 kg) Height:  5' 6.5" (168.9 cm)  BEHAVIORAL SYMPTOMS/MOOD NEUROLOGICAL BOWEL NUTRITION STATUS   (none)  (none) Continent Diet (heart healthy)  AMBULATORY STATUS COMMUNICATION OF NEEDS Skin     Verbally Surgical wounds                       Personal Care Assistance Level of Assistance  Bathing, Dressing Bathing Assistance: Limited assistance   Dressing Assistance: Limited assistance     Functional Limitations Info             SPECIAL CARE FACTORS FREQUENCY  PT (By licensed PT)                    Contractures Contractures Info: Not present    Additional  Factors Info  Code Status Code Status Info: full             Current Medications (04/23/2015):  This is the current hospital active medication list Current Facility-Administered Medications  Medication Dose Route Frequency Provider Last Rate Last Dose  . 0.9 %  sodium chloride infusion   Intravenous Continuous Max Sane, MD 75 mL/hr at 04/23/15 0132    . acetaminophen (TYLENOL) tablet 650 mg  650 mg Oral Q6H PRN Max Sane, MD       Or  . acetaminophen  (TYLENOL) suppository 650 mg  650 mg Rectal Q6H PRN Max Sane, MD      . ARIPiprazole (ABILIFY) tablet 2 mg  2 mg Oral Daily Max Sane, MD   2 mg at 04/23/15 1023  . aspirin EC tablet 81 mg  81 mg Oral Daily Max Sane, MD   81 mg at 04/23/15 1003  . baclofen (LIORESAL) tablet 10 mg  10 mg Oral TID Max Sane, MD   10 mg at 04/23/15 1003  . bisacodyl (DULCOLAX) EC tablet 5 mg  5 mg Oral Daily PRN Max Sane, MD      . cholecalciferol (VITAMIN D) tablet 1,000 Units  1,000 Units Oral Daily Max Sane, MD   1,000 Units at 04/23/15 1004  . ciprofloxacin (CIPRO) tablet 500 mg  500 mg Oral BID Max Sane, MD   500 mg at 04/23/15 1003  . clonazePAM (KLONOPIN) tablet 1 mg  1 mg Oral TID PRN Max Sane, MD      . docusate sodium (COLACE) capsule 100 mg  100 mg Oral BID Max Sane, MD   100 mg at 04/23/15 1004  . DULoxetine (CYMBALTA) DR capsule 60 mg  60 mg Oral Kingsley Callander, MD   60 mg at 04/23/15 0859  . escitalopram (LEXAPRO) tablet 20 mg  20 mg Oral Daily Max Sane, MD   20 mg at 04/23/15 1004  . ferrous sulfate tablet 325 mg  325 mg Oral BID WC Max Sane, MD   325 mg at 04/23/15 0859  . folic acid (FOLVITE) tablet 1 mg  1 mg Oral Daily Vipul Shah, MD   1 mg at 04/23/15 1004  . mometasone-formoterol (DULERA) 200-5 MCG/ACT inhaler 2 puff  2 puff Inhalation BID Max Sane, MD   2 puff at 04/23/15 0900  . nicotine (NICODERM CQ - dosed in mg/24 hours) patch 21 mg  21 mg Transdermal Daily Max Sane, MD   21 mg at 04/23/15 1005  . nystatin (MYCOSTATIN) 100000 UNIT/ML suspension 500,000 Units  5 mL Oral QID Max Sane, MD   500,000 Units at 04/23/15 1004  . ondansetron (ZOFRAN) tablet 4 mg  4 mg Oral Q6H PRN Max Sane, MD   4 mg at 04/22/15 2330   Or  . ondansetron (ZOFRAN) injection 4 mg  4 mg Intravenous Q6H PRN Max Sane, MD      . ondansetron (ZOFRAN) tablet 4 mg  4 mg Oral Q6H PRN Max Sane, MD      . oxyCODONE-acetaminophen (PERCOCET/ROXICET) 5-325 MG per tablet 1 tablet  1 tablet Oral Q8H  PRN Max Sane, MD   1 tablet at 04/23/15 1045  . pantoprazole (PROTONIX) EC tablet 40 mg  40 mg Oral BID AC Max Sane, MD   40 mg at 04/23/15 0859  . polyethylene glycol (MIRALAX / GLYCOLAX) packet 17 g  17 g Oral Daily Vipul Shah, MD   17 g at 04/23/15 1004  . senna-docusate (Senokot-S)  tablet 1 tablet  1 tablet Oral QHS PRN Max Sane, MD      . tiotropium [facility]) inhalation capsule 18 mcg  18 mcg Inhalation Daily Max Sane, MD   18 mcg at 04/23/15 1023  . traZODone (DESYREL) tablet 50 mg  50 mg Oral QHS PRN Max Sane, MD      . zolpidem (AMBIEN) tablet 10 mg  10 mg Oral QHS PRN Max Sane, MD       Facility-Administered Medications Ordered in Other Encounters  Medication Dose Route Frequency Provider Last Rate Last Dose  . heparin lock flush 100 unit/mL  500 Units Intravenous Once Lequita Asal, MD      . sodium chloride 0.9 % injection 10 mL  10 mL Intravenous PRN Lequita Asal, MD         Discharge Medications: Please see discharge summary for a list of discharge medications.  Relevant Imaging Results:  Relevant Lab Results:   Additional Information SS: [id number]  Shela Leff, LCSW

## 2015-04-24 ENCOUNTER — Inpatient Hospital Stay: Payer: Commercial Managed Care - HMO | Admitting: Unknown Physician Specialty

## 2015-04-24 DIAGNOSIS — R531 Weakness: Secondary | ICD-10-CM | POA: Diagnosis not present

## 2015-04-24 LAB — BASIC METABOLIC PANEL
ANION GAP: 2 — AB (ref 5–15)
BUN: <5 mg/dL — ABNORMAL LOW (ref 6–20)
CALCIUM: 7.6 mg/dL — AB (ref 8.9–10.3)
CHLORIDE: 108 mmol/L (ref 101–111)
CO2: 25 mmol/L (ref 22–32)
Creatinine, Ser: 0.77 mg/dL (ref 0.44–1.00)
GFR calc non Af Amer: >60 mL/min (ref >60)
GLUCOSE: 93 mg/dL (ref 65–99)
POTASSIUM: 3.3 mmol/L — AB (ref 3.5–5.1)
Sodium: 135 mmol/L (ref 135–145)

## 2015-04-24 LAB — MAGNESIUM: Magnesium: 1.6 mg/dL — ABNORMAL LOW (ref 1.7–2.4)

## 2015-04-24 LAB — GLUCOSE, CAPILLARY: Glucose-Capillary: 85 mg/dL (ref 65–99)

## 2015-04-24 MED ORDER — POTASSIUM CHLORIDE CRYS ER 20 MEQ PO TBCR
40.0000 meq | EXTENDED_RELEASE_TABLET | Freq: Once | ORAL | Status: AC
  Administered 2015-04-24: 40 meq via ORAL
  Filled 2015-04-24: qty 2

## 2015-04-24 MED ORDER — OXYCODONE-ACETAMINOPHEN 5-325 MG PO TABS: 1.0000 | ORAL_TABLET | Freq: Three times a day (TID) | ORAL | Status: DC | PRN

## 2015-04-24 MED ORDER — MAGNESIUM SULFATE 2 GM/50ML IV SOLN
2.0000 g | Freq: Once | INTRAVENOUS | Status: AC
  Administered 2015-04-24: 2 g via INTRAVENOUS
  Filled 2015-04-24: qty 50

## 2015-04-24 NOTE — Consult Note (Signed)
   [facility] CM Inpatient Consult   04/24/2015  [name] [birth year] [id number]      Patient is already active with [place] Management program. Referral received. Attempted to contact patient via phone. However, no answer. Chart reviewed. Noted patient is supposed to go to [facility] at discharge. Will request for [facility] Licensed CSW to be assigned as it appears disposition is for SNF. Spoke with covering inpatient RNCM to make aware that patient will be followed by [place] Management program.    Marthenia Rolling, MSN-Ed, RN,BSN [facility] Liaison [phone]

## 2015-04-24 NOTE — Discharge Instructions (Signed)
Heart healthy diet. Activity as tolerated. Fall precaution.

## 2015-04-24 NOTE — Progress Notes (Signed)
Pt A and O x 4. VSS. Pt tolerating diet well. No complaints of pain or nausea. IV removed tip intact. Report called to [place] at [facility]. Colostomy clean, dry, and intact. Pt left via EMS.

## 2015-04-24 NOTE — Discharge Summary (Signed)
[facility] [place] NAME: Jalisha Enneking    MR#:  [id number]  DATE OF BIRTH:  [birth year]  DATE OF ADMISSION:  04/22/2015 ADMITTING PHYSICIAN: Max Sane, MD  DATE OF DISCHARGE: 04/24/2015 PRIMARY CARE PHYSICIAN: [name], NP    ADMISSION DIAGNOSIS:  Abdominal Pain   DISCHARGE DIAGNOSIS:  Generalized Weakness Hypokalemia Recent Escherichia coli UTI SECONDARY DIAGNOSIS:   Past Medical History  Diagnosis Date  . Schizophrenia ([place])   . Asthma   . GERD (gastroesophageal reflux disease)   . Anxiety   . Depression   . Bipolar disorder ([place])   . COPD (chronic obstructive pulmonary disease) ([place])   . Occasional tremors     right hand  . PTSD (post-traumatic stress disorder)   . Shortness of breath dyspnea   . Fibromyalgia   . DVT (deep venous thrombosis) ([place]) 2011    RUE  . Thyroid nodule   . DDD (degenerative disc disease), lumbar   . Spinal stenosis   . Peripheral neuropathy ([place])   . Rotator cuff tear     right  . Pneumonia 2011  . Hypothyroidism     no meds currently  . Anemia     during pregnancy only  . Hypertension     Off meds x 15 years-well controlled now per pt  . Squamous cell cancer, anus ([facility])   . Severe obstructive sleep apnea 06/27/2014  . DVT of upper extremity (deep vein thrombosis) ([place]) 10/14/2014    HOSPITAL COURSE:  60 year old female with recent discharge on 4/15 is being readmitted for generalized weakness, likely will require skilled nursing facility placement, please note this is her sixth admission in last 6 month, third admission within last 2 weeks  * Generalized Weakness, likely multifactorial PT: SNF. She agrees.  * Hypokalemia - Repleted and f/u BMP as outpatient.  * Hypomagnesemia. IV mag.  * Recent Escherichia coli UTI - Continue ciprofloxacin for total 4 more days to finish course  * Anxiety/depression/bipolar disorder - Continue home medication  * Recent admission  for GI bleed - EGD and colonoscopy on last admission showing diverticulosis only.  Back pain. Chronic. Pain control prn.   DISCHARGE CONDITIONS:   Stable, discharge to SNF.  CONSULTS OBTAINED:     DRUG ALLERGIES:   Allergies  Allergen Reactions  . Fish Allergy Anaphylaxis    Patient allergic to perch only. She can eat other fish.  . Sulfa Antibiotics Hives    DISCHARGE MEDICATIONS:   Current Discharge Medication List    CONTINUE these medications which have CHANGED   Details  oxyCODONE-acetaminophen (PERCOCET/ROXICET) 5-325 MG tablet Take 1 tablet by mouth every 8 (eight) hours as needed for moderate pain. Qty: 15 tablet, Refills: 0      CONTINUE these medications which have NOT CHANGED   Details  albuterol (PROVENTIL) (2.5 MG/3ML) 0.083% nebulizer solution Take 3 mLs (2.5 mg total) by nebulization every 4 (four) hours. Qty: 360 vial, Refills: 12    ARIPiprazole (ABILIFY) 2 MG tablet Take 1 tablet (2 mg total) by mouth daily. Qty: 30 tablet, Refills: 4    aspirin EC 81 MG EC tablet Take 1 tablet (81 mg total) by mouth daily. Qty: 30 tablet, Refills: 1    budesonide-formoterol (SYMBICORT) 160-4.5 MCG/ACT inhaler Inhale 2 puffs into the lungs 2 (two) times daily. Qty: 1 Inhaler, Refills: 12    cholecalciferol 1000 units tablet Take 1 tablet (1,000 Units total) by mouth daily. Qty: 30 tablet, Refills:  0    clonazePAM (KLONOPIN) 1 MG tablet Take 1 tablet (1 mg total) by mouth 3 (three) times daily as needed for anxiety. Qty: 90 tablet, Refills: 4    DULoxetine (CYMBALTA) 60 MG capsule Take 60 mg by mouth every morning.     escitalopram (LEXAPRO) 20 MG tablet Take 1 tablet (20 mg total) by mouth daily. Qty: 30 tablet, Refills: 4    ferrous sulfate 325 (65 FE) MG tablet Take 1 tablet (325 mg total) by mouth 2 (two) times daily with a meal. Qty: 60 tablet, Refills: 0    folic acid (FOLVITE) 1 MG tablet Take 1 tablet (1 mg total) by mouth daily. Qty: 30 tablet,  Refills: 3    nicotine (NICODERM CQ - DOSED IN MG/24 HOURS) 21 mg/24hr patch Place 1 patch (21 mg total) onto the skin daily. Qty: 28 patch, Refills: 0    nystatin (MYCOSTATIN) 100000 UNIT/ML suspension Take 5 mLs (500,000 Units total) by mouth 4 (four) times daily. Qty: 60 mL, Refills: 2    ondansetron (ZOFRAN) 4 MG tablet Take 1 tablet (4 mg total) by mouth every 6 (six) hours as needed for nausea. Qty: 20 tablet, Refills: 0    pantoprazole (PROTONIX) 40 MG tablet Take 40 mg by mouth 2 (two) times daily.     polyethylene glycol (MIRALAX / GLYCOLAX) packet Take 17 g by mouth daily. Qty: 14 each, Refills: 0    senna-docusate (SENOKOT-S) 8.6-50 MG tablet Take 1 tablet by mouth at bedtime as needed for mild constipation. Qty: 30 tablet, Refills: 5    tiotropium (SPIRIVA) 18 MCG inhalation capsule Place 1 capsule (18 mcg total) into inhaler and inhale daily. Qty: 30 capsule, Refills: 12    traZODone (DESYREL) 50 MG tablet Take 1 tablet (50 mg total) by mouth at bedtime as needed for sleep. Qty: 90 tablet, Refills: 1    zolpidem (AMBIEN) 10 MG tablet Take 1 tablet (10 mg total) by mouth at bedtime as needed for sleep. Qty: 30 tablet, Refills: 4    baclofen (LIORESAL) 10 MG tablet Take 1 tablet (10 mg total) by mouth 3 (three) times daily. Qty: 90 each, Refills: 6    ciprofloxacin (CIPRO) 500 MG tablet Take 1 tablet (500 mg total) by mouth 2 (two) times daily. Qty: 10 tablet, Refills: 0    pregabalin (LYRICA) 200 MG capsule Take 1 capsule (200 mg total) by mouth 2 (two) times daily as needed. Qty: 90 capsule, Refills: 6         DISCHARGE INSTRUCTIONS:     If you experience worsening of your admission symptoms, develop shortness of breath, life threatening emergency, suicidal or homicidal thoughts you must seek medical attention immediately by calling 911 or calling your MD immediately  if symptoms less severe.  You Must read complete instructions/literature along with all  the possible adverse reactions/side effects for all the Medicines you take and that have been prescribed to you. Take any new Medicines after you have completely understood and accept all the possible adverse reactions/side effects.   Please note  You were cared for by a hospitalist during your hospital stay. If you have any questions about your discharge medications or the care you received while you were in the hospital after you are discharged, you can call the unit and asked to speak with the hospitalist on call if the hospitalist that took care of you is not available. Once you are discharged, your primary care physician will handle any further medical issues.  Please note that NO REFILLS for any discharge medications will be authorized once you are discharged, as it is imperative that you return to your primary care physician (or establish a relationship with a primary care physician if you do not have one) for your aftercare needs so that they can reassess your need for medications and monitor your lab values.    Today   SUBJECTIVE   Back and chest pain, exacerbated by cough and movement, want to increase oxycodone to 10 mg.   VITAL SIGNS:  Blood pressure 117/63, pulse 76, temperature 98.1 F (36.7 C), temperature source Oral, resp. rate 20, height 5' 6.5" (1.689 m), weight 105.733 kg (233 lb 1.6 oz), SpO2 93 %.  I/O:   Intake/Output Summary (Last 24 hours) at 04/24/15 1035 Last data filed at 04/24/15 0900  Gross per 24 hour  Intake   2575 ml  Output    750 ml  Net   1825 ml    PHYSICAL EXAMINATION:  GENERAL:  60 y.o.-year-old patient lying in the bed with no acute distress. Morbid obese. EYES: Pupils equal, round, reactive to light and accommodation. No scleral icterus. Extraocular muscles intact.  HEENT: Head atraumatic, normocephalic. Oropharynx and nasopharynx clear.  NECK:  Supple, no jugular venous distention. No thyroid enlargement, no tenderness.  LUNGS: Normal breath  sounds bilaterally, no wheezing, rales,rhonchi or crepitation. No use of accessory muscles of respiration.  CARDIOVASCULAR: S1, S2 normal. No murmurs, rubs, or gallops.  ABDOMEN: Soft, non-tender, non-distended. Bowel sounds present. No organomegaly or mass.  EXTREMITIES: No pedal edema, cyanosis, or clubbing.  NEUROLOGIC: Cranial nerves II through XII are intact. Muscle strength 3-4/5 in all extremities. Sensation intact. Gait not checked.  PSYCHIATRIC: The patient is alert and oriented x 3.  SKIN: No obvious rash, lesion, or ulcer.   DATA REVIEW:   CBC  Recent Labs Lab 04/23/15 0527  WBC 8.5  HGB 8.3*  HCT 25.2*  PLT 195    Chemistries   Recent Labs Lab 04/22/15 2102  04/24/15 0537  NA 137  < > 135  K 3.3*  < > 3.3*  CL 106  < > 108  CO2 28  < > 25  GLUCOSE 93  < > 93  BUN <5*  < > <5*  CREATININE 0.76  < > 0.77  CALCIUM 8.1*  < > 7.6*  MG  --   --  1.6*  AST 32  --   --   ALT 10*  --   --   ALKPHOS 92  --   --   BILITOT 0.5  --   --   < > = values in this interval not displayed.  Cardiac Enzymes  Recent Labs Lab 04/17/15 2226  TROPONINI <0.03    Microbiology Results  Results for orders placed or performed during the hospital encounter of 04/17/15  Urine culture     Status: Abnormal   Collection Time: 04/17/15 10:42 PM  Result Value Ref Range Status   Specimen Description URINE, CLEAN CATCH  Final   Special Requests NONE  Final   Culture (A)  Final    >=100,000 COLONIES/mL KLEBSIELLA PNEUMONIAE >=100,000 COLONIES/mL ESCHERICHIA COLI    Report Status 04/21/2015 FINAL  Final   Organism ID, Bacteria ESCHERICHIA COLI (A)  Final   Organism ID, Bacteria KLEBSIELLA PNEUMONIAE (A)  Final      Susceptibility   Escherichia coli - MIC*    AMPICILLIN >=32 RESISTANT Resistant     CEFAZOLIN <=4 SENSITIVE Sensitive  CEFTRIAXONE <=1 SENSITIVE Sensitive     CIPROFLOXACIN 1 SENSITIVE Sensitive     GENTAMICIN <=1 SENSITIVE Sensitive     IMIPENEM <=0.25  SENSITIVE Sensitive     NITROFURANTOIN <=16 SENSITIVE Sensitive     TRIMETH/SULFA <=20 SENSITIVE Sensitive     AMPICILLIN/SULBACTAM >=32 RESISTANT Resistant     PIP/TAZO <=4 SENSITIVE Sensitive     Extended ESBL NEGATIVE Sensitive     * >=100,000 COLONIES/mL ESCHERICHIA COLI   Klebsiella pneumoniae - MIC*    Extended ESBL NEGATIVE Sensitive     AMPICILLIN/SULBACTAM Value in next row Intermediate      INTERMEDIATE16    PIP/TAZO Value in next row Sensitive      SENSITIVE16    CEFTRIAXONE Value in next row Sensitive      SENSITIVE<=1    IMIPENEM Value in next row Sensitive      SENSITIVE1    GENTAMICIN Value in next row Sensitive      SENSITIVE<=1    CIPROFLOXACIN Value in next row Sensitive      SENSITIVE<=0.25    NITROFURANTOIN Value in next row Intermediate      INTERMEDIATE64    TRIMETH/SULFA Value in next row Sensitive      SENSITIVE<=20    * >=100,000 COLONIES/mL KLEBSIELLA PNEUMONIAE   *Note: Due to a large number of results and/or encounters for the requested time period, some results have not been displayed. A complete set of results can be found in Results Review.    RADIOLOGY:  Dg Abd Acute W/chest  04/22/2015  CLINICAL DATA:  Abdominal pain. Recent hospitalization and talc pleurodesis. EXAM: DG ABDOMEN ACUTE W/ 1V CHEST COMPARISON:  Most recent chest radiograph 04/17/2015. CT abdomen/pelvis 03/15/2015 FINDINGS: Low lung volumes with bilateral pleural effusions and bibasilar opacities. Pleural effusion on the left is increased from prior exam. Cardiomediastinal contours are unchanged. No pneumothorax. Prominent air-filled central small bowel loops measuring up to 3.5 cm in diameter. Mild gaseous gastric distention. No evidence of free air. Bilateral tubal ligation clips. No evidence of acute osseous abnormality. IMPRESSION: 1. Mild gaseous distention of bowel loops in the central abdomen, in a pattern suggestive of enteritis or ileus. There is no evidence of obstruction. 2.  Bilateral pleural effusions and bibasilar opacities. The left pleural effusion has increased from chest radiograph 5 days prior. Right pleural thickening/effusion is unchanged. Electronically Signed   By: Jeb Levering M.D.   On: 04/22/2015 20:46        Management plans discussed with the patient, family and they are in agreement.  CODE STATUS:     Code Status Orders        Start     Ordered   04/23/15 0041  Full code   Continuous     04/23/15 0040    Code Status History    Date Active Date Inactive Code Status Order ID Comments User Context   04/18/2015  2:43 AM 04/21/2015  9:09 PM Full Code [id number]  [name], MD Inpatient   04/08/2015  2:01 PM 04/17/2015  7:16 PM Full Code [id number]  Idelle Crouch, MD ED   03/16/2015  1:41 AM 03/18/2015  6:23 PM Full Code [id number]  Lance Coon, MD ED   03/05/2015  6:17 AM 03/07/2015  8:09 PM Full Code [id number]  [name], MD Inpatient   02/26/2015  5:43 PM 03/03/2015  6:54 PM Full Code [id number]  Bettey Costa, MD Inpatient   10/14/2014 10:59 PM 10/17/2014  4:34 PM Full Code [id number]  Lytle Butte, MD ED   05/17/2014  8:38 PM 05/30/2014  4:55 PM Full Code [id number]  Fritzi Mandes, MD Inpatient    Advance Directive Documentation        Most Recent Value   Type of Advance Directive  Healthcare Power of Attorney   Pre-existing out of facility DNR order (yellow form or pink MOST form)     "MOST" Form in Place?        TOTAL TIME TAKING CARE OF THIS PATIENT: 33 minutes.    Demetrios Loll M.D on 04/24/2015 at 10:35 AM  Between 7am to 6pm - Pager - [phone]  After 6pm go to [email or web address] - password [email or web address] [facility]  Office  [phone]  CC: Primary care physician; [name], NP

## 2015-04-25 ENCOUNTER — Ambulatory Visit: Payer: Commercial Managed Care - HMO | Admitting: Pulmonary Disease

## 2015-04-25 ENCOUNTER — Ambulatory Visit: Payer: Commercial Managed Care - HMO | Admitting: Unknown Physician Specialty

## 2015-04-26 ENCOUNTER — Other Ambulatory Visit: Payer: Self-pay | Admitting: Hematology and Oncology

## 2015-04-26 DIAGNOSIS — E876 Hypokalemia: Secondary | ICD-10-CM | POA: Diagnosis present

## 2015-04-26 DIAGNOSIS — C210 Malignant neoplasm of anus, unspecified: Secondary | ICD-10-CM

## 2015-04-26 DIAGNOSIS — D649 Anemia, unspecified: Secondary | ICD-10-CM | POA: Diagnosis not present

## 2015-04-26 LAB — CBC WITH DIFFERENTIAL/PLATELET
Basophils Absolute: 0.1 10*3/uL (ref 0–0.1)
Basophils Relative: 1 %
Eosinophils Absolute: 0.7 10*3/uL (ref 0–0.7)
Eosinophils Relative: 8 %
HEMATOCRIT: 23.2 % — AB (ref 35.0–47.0)
HEMOGLOBIN: 7.7 g/dL — AB (ref 12.0–16.0)
LYMPHS ABS: 0.7 10*3/uL — AB (ref 1.0–3.6)
LYMPHS PCT: 9 %
MCH: 29.2 pg (ref 26.0–34.0)
MCHC: 33.1 g/dL (ref 32.0–36.0)
MCV: 88.3 fL (ref 80.0–100.0)
MONOS PCT: 7 %
Monocytes Absolute: 0.5 10*3/uL (ref 0.2–0.9)
NEUTROS PCT: 75 %
Neutro Abs: 6 10*3/uL (ref 1.4–6.5)
Platelets: 200 10*3/uL (ref 150–440)
RBC: 2.63 MIL/uL — AB (ref 3.80–5.20)
RDW: 18.5 % — ABNORMAL HIGH (ref 11.5–14.5)
WBC: 8 10*3/uL (ref 3.6–11.0)

## 2015-04-26 LAB — COMPREHENSIVE METABOLIC PANEL
ALK PHOS: 90 U/L (ref 38–126)
ALT: 8 U/L — AB (ref 14–54)
AST: 37 U/L (ref 15–41)
Albumin: 1.7 g/dL — ABNORMAL LOW (ref 3.5–5.0)
Anion gap: 3 — ABNORMAL LOW (ref 5–15)
BILIRUBIN TOTAL: 0.3 mg/dL (ref 0.3–1.2)
BUN: 6 mg/dL (ref 6–20)
CALCIUM: 7.9 mg/dL — AB (ref 8.9–10.3)
CO2: 28 mmol/L (ref 22–32)
CREATININE: 0.84 mg/dL (ref 0.44–1.00)
Chloride: 104 mmol/L (ref 101–111)
Glucose, Bld: 109 mg/dL — ABNORMAL HIGH (ref 65–99)
Potassium: 3.4 mmol/L — ABNORMAL LOW (ref 3.5–5.1)
Sodium: 135 mmol/L (ref 135–145)
TOTAL PROTEIN: 4.9 g/dL — AB (ref 6.5–8.1)

## 2015-04-30 ENCOUNTER — Encounter: Payer: Self-pay | Admitting: Pain Medicine

## 2015-04-30 ENCOUNTER — Ambulatory Visit: Payer: Commercial Managed Care - HMO | Attending: Pain Medicine | Admitting: Pain Medicine

## 2015-04-30 VITALS — BP 120/67 | HR 80 | Temp 97.9°F | Resp 16 | Ht 66.0 in | Wt 233.0 lb

## 2015-04-30 DIAGNOSIS — S22050S Wedge compression fracture of T5-T6 vertebra, sequela: Secondary | ICD-10-CM | POA: Insufficient documentation

## 2015-04-30 DIAGNOSIS — K5660 Unspecified intestinal obstruction: Secondary | ICD-10-CM | POA: Diagnosis not present

## 2015-04-30 DIAGNOSIS — I10 Essential (primary) hypertension: Secondary | ICD-10-CM | POA: Insufficient documentation

## 2015-04-30 DIAGNOSIS — E876 Hypokalemia: Secondary | ICD-10-CM | POA: Diagnosis not present

## 2015-04-30 DIAGNOSIS — G4733 Obstructive sleep apnea (adult) (pediatric): Secondary | ICD-10-CM | POA: Diagnosis not present

## 2015-04-30 DIAGNOSIS — K602 Anal fissure, unspecified: Secondary | ICD-10-CM | POA: Insufficient documentation

## 2015-04-30 DIAGNOSIS — K219 Gastro-esophageal reflux disease without esophagitis: Secondary | ICD-10-CM | POA: Diagnosis not present

## 2015-04-30 DIAGNOSIS — M810 Age-related osteoporosis without current pathological fracture: Secondary | ICD-10-CM | POA: Diagnosis not present

## 2015-04-30 DIAGNOSIS — D7589 Other specified diseases of blood and blood-forming organs: Secondary | ICD-10-CM | POA: Diagnosis not present

## 2015-04-30 DIAGNOSIS — E039 Hypothyroidism, unspecified: Secondary | ICD-10-CM | POA: Insufficient documentation

## 2015-04-30 DIAGNOSIS — S22000S Wedge compression fracture of unspecified thoracic vertebra, sequela: Secondary | ICD-10-CM

## 2015-04-30 DIAGNOSIS — G4700 Insomnia, unspecified: Secondary | ICD-10-CM | POA: Insufficient documentation

## 2015-04-30 DIAGNOSIS — M549 Dorsalgia, unspecified: Secondary | ICD-10-CM | POA: Diagnosis present

## 2015-04-30 DIAGNOSIS — F309 Manic episode, unspecified: Secondary | ICD-10-CM | POA: Diagnosis not present

## 2015-04-30 DIAGNOSIS — G893 Neoplasm related pain (acute) (chronic): Secondary | ICD-10-CM | POA: Insufficient documentation

## 2015-04-30 DIAGNOSIS — Z79899 Other long term (current) drug therapy: Secondary | ICD-10-CM | POA: Diagnosis not present

## 2015-04-30 DIAGNOSIS — M5410 Radiculopathy, site unspecified: Secondary | ICD-10-CM

## 2015-04-30 DIAGNOSIS — M25519 Pain in unspecified shoulder: Secondary | ICD-10-CM | POA: Diagnosis present

## 2015-04-30 DIAGNOSIS — M4854XS Collapsed vertebra, not elsewhere classified, thoracic region, sequela of fracture: Secondary | ICD-10-CM | POA: Insufficient documentation

## 2015-04-30 DIAGNOSIS — F419 Anxiety disorder, unspecified: Secondary | ICD-10-CM | POA: Diagnosis not present

## 2015-04-30 DIAGNOSIS — F4310 Post-traumatic stress disorder, unspecified: Secondary | ICD-10-CM | POA: Insufficient documentation

## 2015-04-30 DIAGNOSIS — E785 Hyperlipidemia, unspecified: Secondary | ICD-10-CM | POA: Insufficient documentation

## 2015-04-30 DIAGNOSIS — E538 Deficiency of other specified B group vitamins: Secondary | ICD-10-CM

## 2015-04-30 DIAGNOSIS — Z86718 Personal history of other venous thrombosis and embolism: Secondary | ICD-10-CM | POA: Insufficient documentation

## 2015-04-30 DIAGNOSIS — J441 Chronic obstructive pulmonary disease with (acute) exacerbation: Secondary | ICD-10-CM | POA: Insufficient documentation

## 2015-04-30 DIAGNOSIS — F1190 Opioid use, unspecified, uncomplicated: Secondary | ICD-10-CM | POA: Diagnosis not present

## 2015-04-30 DIAGNOSIS — R531 Weakness: Secondary | ICD-10-CM | POA: Diagnosis not present

## 2015-04-30 DIAGNOSIS — R1011 Right upper quadrant pain: Secondary | ICD-10-CM | POA: Diagnosis not present

## 2015-04-30 DIAGNOSIS — F209 Schizophrenia, unspecified: Secondary | ICD-10-CM | POA: Diagnosis not present

## 2015-04-30 DIAGNOSIS — M797 Fibromyalgia: Secondary | ICD-10-CM | POA: Insufficient documentation

## 2015-04-30 DIAGNOSIS — K644 Residual hemorrhoidal skin tags: Secondary | ICD-10-CM | POA: Insufficient documentation

## 2015-04-30 DIAGNOSIS — F319 Bipolar disorder, unspecified: Secondary | ICD-10-CM | POA: Diagnosis not present

## 2015-04-30 DIAGNOSIS — C210 Malignant neoplasm of anus, unspecified: Secondary | ICD-10-CM | POA: Insufficient documentation

## 2015-04-30 DIAGNOSIS — F0390 Unspecified dementia without behavioral disturbance: Secondary | ICD-10-CM | POA: Insufficient documentation

## 2015-04-30 DIAGNOSIS — J45909 Unspecified asthma, uncomplicated: Secondary | ICD-10-CM | POA: Insufficient documentation

## 2015-04-30 DIAGNOSIS — C3490 Malignant neoplasm of unspecified part of unspecified bronchus or lung: Secondary | ICD-10-CM | POA: Diagnosis not present

## 2015-04-30 DIAGNOSIS — R630 Anorexia: Secondary | ICD-10-CM | POA: Insufficient documentation

## 2015-04-30 DIAGNOSIS — G8929 Other chronic pain: Secondary | ICD-10-CM | POA: Diagnosis not present

## 2015-04-30 DIAGNOSIS — Z8719 Personal history of other diseases of the digestive system: Secondary | ICD-10-CM | POA: Diagnosis not present

## 2015-04-30 DIAGNOSIS — D649 Anemia, unspecified: Secondary | ICD-10-CM | POA: Diagnosis not present

## 2015-04-30 DIAGNOSIS — Z79891 Long term (current) use of opiate analgesic: Secondary | ICD-10-CM | POA: Diagnosis not present

## 2015-04-30 DIAGNOSIS — G894 Chronic pain syndrome: Secondary | ICD-10-CM

## 2015-04-30 DIAGNOSIS — S22000A Wedge compression fracture of unspecified thoracic vertebra, initial encounter for closed fracture: Secondary | ICD-10-CM | POA: Insufficient documentation

## 2015-04-30 DIAGNOSIS — X58XXXA Exposure to other specified factors, initial encounter: Secondary | ICD-10-CM | POA: Insufficient documentation

## 2015-04-30 DIAGNOSIS — M791 Myalgia: Secondary | ICD-10-CM | POA: Diagnosis not present

## 2015-04-30 DIAGNOSIS — K626 Ulcer of anus and rectum: Secondary | ICD-10-CM | POA: Diagnosis not present

## 2015-04-30 DIAGNOSIS — M1288 Other specific arthropathies, not elsewhere classified, other specified site: Secondary | ICD-10-CM | POA: Diagnosis not present

## 2015-04-30 DIAGNOSIS — M545 Low back pain: Secondary | ICD-10-CM

## 2015-04-30 DIAGNOSIS — G629 Polyneuropathy, unspecified: Secondary | ICD-10-CM | POA: Diagnosis not present

## 2015-04-30 DIAGNOSIS — M5414 Radiculopathy, thoracic region: Secondary | ICD-10-CM

## 2015-04-30 DIAGNOSIS — Z87891 Personal history of nicotine dependence: Secondary | ICD-10-CM | POA: Insufficient documentation

## 2015-04-30 DIAGNOSIS — M47814 Spondylosis without myelopathy or radiculopathy, thoracic region: Secondary | ICD-10-CM | POA: Diagnosis not present

## 2015-04-30 DIAGNOSIS — S22080S Wedge compression fracture of T11-T12 vertebra, sequela: Secondary | ICD-10-CM

## 2015-04-30 DIAGNOSIS — M47816 Spondylosis without myelopathy or radiculopathy, lumbar region: Secondary | ICD-10-CM

## 2015-04-30 DIAGNOSIS — Z5181 Encounter for therapeutic drug level monitoring: Secondary | ICD-10-CM | POA: Insufficient documentation

## 2015-04-30 MED ORDER — OXYCODONE HCL 10 MG PO TABS: 10.0000 mg | ORAL_TABLET | Freq: Four times a day (QID) | ORAL | Status: DC | PRN
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
## 2015-04-30 NOTE — Progress Notes (Signed)
Safety precautions to be maintained throughout the outpatient stay will include: orient to surroundings, keep bed in low position, maintain call bell within reach at all times, provide assistance with transfer out of bed and ambulation. Patient has recently found out that she has Lung cancer.  She is in rehab at [facility] currently due to weakness.  Patient has previously had anal cancer and has a colostomy.

## 2015-05-01 ENCOUNTER — Other Ambulatory Visit: Payer: Self-pay | Admitting: Unknown Physician Specialty

## 2015-05-01 ENCOUNTER — Inpatient Hospital Stay: Payer: Commercial Managed Care - HMO

## 2015-05-01 ENCOUNTER — Other Ambulatory Visit: Payer: Self-pay | Admitting: *Deleted

## 2015-05-01 ENCOUNTER — Encounter: Payer: Self-pay | Admitting: Hematology and Oncology

## 2015-05-01 ENCOUNTER — Ambulatory Visit
Admission: RE | Admit: 2015-05-01 | Discharge: 2015-05-01 | Disposition: A | Discharge status: Home / Self Care | Payer: Commercial Managed Care - HMO | Source: Ambulatory Visit | Attending: Hematology and Oncology | Admitting: Hematology and Oncology

## 2015-05-01 ENCOUNTER — Encounter: Payer: Self-pay | Admitting: Pain Medicine

## 2015-05-01 ENCOUNTER — Inpatient Hospital Stay: Payer: Commercial Managed Care - HMO | Attending: Hematology and Oncology | Admitting: Hematology and Oncology

## 2015-05-01 ENCOUNTER — Telehealth: Payer: Self-pay

## 2015-05-01 ENCOUNTER — Ambulatory Visit: Payer: Self-pay | Admitting: Psychiatry

## 2015-05-01 ENCOUNTER — Ambulatory Visit (INDEPENDENT_AMBULATORY_CARE_PROVIDER_SITE_OTHER): Payer: Commercial Managed Care - HMO | Admitting: Psychiatry

## 2015-05-01 VITALS — BP 146/80 | HR 88 | Temp 96.6°F | Resp 18 | Ht 66.0 in | Wt 221.6 lb

## 2015-05-01 DIAGNOSIS — R600 Localized edema: Secondary | ICD-10-CM | POA: Diagnosis not present

## 2015-05-01 DIAGNOSIS — R5383 Other fatigue: Secondary | ICD-10-CM | POA: Diagnosis not present

## 2015-05-01 DIAGNOSIS — M5414 Radiculopathy, thoracic region: Secondary | ICD-10-CM | POA: Insufficient documentation

## 2015-05-01 DIAGNOSIS — E041 Nontoxic single thyroid nodule: Secondary | ICD-10-CM | POA: Insufficient documentation

## 2015-05-01 DIAGNOSIS — J90 Pleural effusion, not elsewhere classified: Secondary | ICD-10-CM

## 2015-05-01 DIAGNOSIS — F4310 Post-traumatic stress disorder, unspecified: Secondary | ICD-10-CM | POA: Insufficient documentation

## 2015-05-01 DIAGNOSIS — J45909 Unspecified asthma, uncomplicated: Secondary | ICD-10-CM | POA: Diagnosis not present

## 2015-05-01 DIAGNOSIS — M549 Dorsalgia, unspecified: Secondary | ICD-10-CM | POA: Insufficient documentation

## 2015-05-01 DIAGNOSIS — F209 Schizophrenia, unspecified: Secondary | ICD-10-CM | POA: Diagnosis not present

## 2015-05-01 DIAGNOSIS — G4733 Obstructive sleep apnea (adult) (pediatric): Secondary | ICD-10-CM | POA: Diagnosis not present

## 2015-05-01 DIAGNOSIS — F418 Other specified anxiety disorders: Secondary | ICD-10-CM | POA: Insufficient documentation

## 2015-05-01 DIAGNOSIS — K5730 Diverticulosis of large intestine without perforation or abscess without bleeding: Secondary | ICD-10-CM | POA: Insufficient documentation

## 2015-05-01 DIAGNOSIS — J449 Chronic obstructive pulmonary disease, unspecified: Secondary | ICD-10-CM | POA: Diagnosis not present

## 2015-05-01 DIAGNOSIS — G629 Polyneuropathy, unspecified: Secondary | ICD-10-CM | POA: Diagnosis not present

## 2015-05-01 DIAGNOSIS — M545 Low back pain, unspecified: Secondary | ICD-10-CM | POA: Insufficient documentation

## 2015-05-01 DIAGNOSIS — E039 Hypothyroidism, unspecified: Secondary | ICD-10-CM | POA: Diagnosis not present

## 2015-05-01 DIAGNOSIS — D649 Anemia, unspecified: Secondary | ICD-10-CM

## 2015-05-01 DIAGNOSIS — N390 Urinary tract infection, site not specified: Secondary | ICD-10-CM | POA: Diagnosis not present

## 2015-05-01 DIAGNOSIS — F17210 Nicotine dependence, cigarettes, uncomplicated: Secondary | ICD-10-CM | POA: Diagnosis not present

## 2015-05-01 DIAGNOSIS — G8929 Other chronic pain: Secondary | ICD-10-CM | POA: Insufficient documentation

## 2015-05-01 DIAGNOSIS — Z8719 Personal history of other diseases of the digestive system: Secondary | ICD-10-CM | POA: Diagnosis not present

## 2015-05-01 DIAGNOSIS — R2243 Localized swelling, mass and lump, lower limb, bilateral: Secondary | ICD-10-CM

## 2015-05-01 DIAGNOSIS — M7989 Other specified soft tissue disorders: Secondary | ICD-10-CM | POA: Diagnosis not present

## 2015-05-01 DIAGNOSIS — E46 Unspecified protein-calorie malnutrition: Secondary | ICD-10-CM

## 2015-05-01 DIAGNOSIS — M810 Age-related osteoporosis without current pathological fracture: Secondary | ICD-10-CM | POA: Insufficient documentation

## 2015-05-01 DIAGNOSIS — M797 Fibromyalgia: Secondary | ICD-10-CM | POA: Diagnosis not present

## 2015-05-01 DIAGNOSIS — Z809 Family history of malignant neoplasm, unspecified: Secondary | ICD-10-CM | POA: Diagnosis not present

## 2015-05-01 DIAGNOSIS — F319 Bipolar disorder, unspecified: Secondary | ICD-10-CM | POA: Insufficient documentation

## 2015-05-01 DIAGNOSIS — C3490 Malignant neoplasm of unspecified part of unspecified bronchus or lung: Secondary | ICD-10-CM | POA: Insufficient documentation

## 2015-05-01 DIAGNOSIS — Z5181 Encounter for therapeutic drug level monitoring: Secondary | ICD-10-CM

## 2015-05-01 DIAGNOSIS — Z933 Colostomy status: Secondary | ICD-10-CM | POA: Insufficient documentation

## 2015-05-01 DIAGNOSIS — D638 Anemia in other chronic diseases classified elsewhere: Secondary | ICD-10-CM | POA: Diagnosis not present

## 2015-05-01 DIAGNOSIS — C210 Malignant neoplasm of anus, unspecified: Secondary | ICD-10-CM | POA: Diagnosis not present

## 2015-05-01 DIAGNOSIS — E538 Deficiency of other specified B group vitamins: Secondary | ICD-10-CM | POA: Diagnosis not present

## 2015-05-01 DIAGNOSIS — Z8781 Personal history of (healed) traumatic fracture: Secondary | ICD-10-CM | POA: Insufficient documentation

## 2015-05-01 DIAGNOSIS — S22080A Wedge compression fracture of T11-T12 vertebra, initial encounter for closed fracture: Secondary | ICD-10-CM | POA: Insufficient documentation

## 2015-05-01 DIAGNOSIS — Z79899 Other long term (current) drug therapy: Secondary | ICD-10-CM

## 2015-05-01 DIAGNOSIS — M47814 Spondylosis without myelopathy or radiculopathy, thoracic region: Secondary | ICD-10-CM | POA: Insufficient documentation

## 2015-05-01 DIAGNOSIS — Z7982 Long term (current) use of aspirin: Secondary | ICD-10-CM | POA: Insufficient documentation

## 2015-05-01 DIAGNOSIS — M47816 Spondylosis without myelopathy or radiculopathy, lumbar region: Secondary | ICD-10-CM | POA: Insufficient documentation

## 2015-05-01 DIAGNOSIS — Z86718 Personal history of other venous thrombosis and embolism: Secondary | ICD-10-CM | POA: Insufficient documentation

## 2015-05-01 DIAGNOSIS — M4800 Spinal stenosis, site unspecified: Secondary | ICD-10-CM | POA: Diagnosis not present

## 2015-05-01 DIAGNOSIS — B961 Klebsiella pneumoniae [K. pneumoniae] as the cause of diseases classified elsewhere: Secondary | ICD-10-CM

## 2015-05-01 DIAGNOSIS — S22050A Wedge compression fracture of T5-T6 vertebra, initial encounter for closed fracture: Secondary | ICD-10-CM | POA: Insufficient documentation

## 2015-05-01 DIAGNOSIS — Z8701 Personal history of pneumonia (recurrent): Secondary | ICD-10-CM | POA: Diagnosis not present

## 2015-05-01 DIAGNOSIS — E876 Hypokalemia: Secondary | ICD-10-CM | POA: Diagnosis present

## 2015-05-01 DIAGNOSIS — C7800 Secondary malignant neoplasm of unspecified lung: Secondary | ICD-10-CM | POA: Insufficient documentation

## 2015-05-01 DIAGNOSIS — K219 Gastro-esophageal reflux disease without esophagitis: Secondary | ICD-10-CM

## 2015-05-01 DIAGNOSIS — M5136 Other intervertebral disc degeneration, lumbar region: Secondary | ICD-10-CM

## 2015-05-01 LAB — CBC WITH DIFFERENTIAL/PLATELET
BASOS ABS: 0.1 10*3/uL (ref 0–0.1)
BASOS PCT: 1 %
EOS PCT: 13 %
Eosinophils Absolute: 1.1 10*3/uL — ABNORMAL HIGH (ref 0–0.7)
HCT: 23.3 % — ABNORMAL LOW (ref 35.0–47.0)
Hemoglobin: 7.8 g/dL — ABNORMAL LOW (ref 12.0–16.0)
LYMPHS PCT: 8 %
Lymphs Abs: 0.7 10*3/uL — ABNORMAL LOW (ref 1.0–3.6)
MCH: 29.1 pg (ref 26.0–34.0)
MCHC: 33.6 g/dL (ref 32.0–36.0)
MCV: 86.6 fL (ref 80.0–100.0)
MONO ABS: 0.6 10*3/uL (ref 0.2–0.9)
Monocytes Relative: 7 %
Neutro Abs: 6 10*3/uL (ref 1.4–6.5)
Neutrophils Relative %: 71 %
PLATELETS: 251 10*3/uL (ref 150–440)
RBC: 2.69 MIL/uL — AB (ref 3.80–5.20)
RDW: 18.6 % — AB (ref 11.5–14.5)
WBC: 8.4 10*3/uL (ref 3.6–11.0)

## 2015-05-01 LAB — BASIC METABOLIC PANEL
ANION GAP: 7 (ref 5–15)
BUN: 10 mg/dL (ref 6–20)
CALCIUM: 8.4 mg/dL — AB (ref 8.9–10.3)
CO2: 30 mmol/L (ref 22–32)
Chloride: 97 mmol/L — ABNORMAL LOW (ref 101–111)
Creatinine, Ser: 0.86 mg/dL (ref 0.44–1.00)
GFR calc Af Amer: >60 mL/min (ref >60)
GLUCOSE: 83 mg/dL (ref 65–99)
POTASSIUM: 4.1 mmol/L (ref 3.5–5.1)
SODIUM: 134 mmol/L — AB (ref 135–145)

## 2015-05-01 LAB — RETICULOCYTES
RBC.: 2.92 MIL/uL — ABNORMAL LOW (ref 3.80–5.20)
Retic Count, Absolute: 122.6 10*3/uL (ref 19.0–183.0)
Retic Ct Pct: 4.2 % — ABNORMAL HIGH (ref 0.4–3.1)

## 2015-05-01 LAB — IRON AND TIBC
Iron: 25 ug/dL — ABNORMAL LOW (ref 28–170)
Saturation Ratios: 11 % (ref 10.4–31.8)
TIBC: 224 ug/dL — ABNORMAL LOW (ref 250–450)
UIBC: 199 ug/dL

## 2015-05-01 LAB — FOLATE: Folate: 27 ng/mL (ref >5.9)

## 2015-05-01 LAB — FERRITIN: Ferritin: 184 ng/mL (ref 11–307)

## 2015-05-01 MED ORDER — CYANOCOBALAMIN 1000 MCG/ML IJ SOLN
1000.0000 ug | Freq: Once | INTRAMUSCULAR | Status: AC
Start: 2015-05-01 — End: 2015-05-01
  Administered 2015-05-01: 1000 ug via INTRAMUSCULAR
  Filled 2015-05-01: qty 1

## 2015-05-01 NOTE — Telephone Encounter (Signed)
Sherry from the cancer center called and said that we need to put in a referral for this patient to have an audiogram because of the patient has medicaid as a Consulting civil engineer. Judeen Hammans stated that the referral should go to ENT audiology department. They need this within the next 2 weeks.

## 2015-05-01 NOTE — Patient Outreach (Signed)
[facility]) Care Management  [facility] Social Work  05/01/2015  [name] Nov 21, 1955 [id number]  Subjective:  Patient is a 60 year old female, currently in rehabilitation at [place] facility.  Objective:   Encounter Medications:  Outpatient Encounter Prescriptions as of 05/01/2015  Medication Sig Note  . albuterol (PROVENTIL) (2.5 MG/3ML) 0.083% nebulizer solution Take 3 mLs (2.5 mg total) by nebulization every 4 (four) hours. (Patient taking differently: Take 2.5 mg by nebulization every 4 (four) hours as needed for wheezing or shortness of breath. )   . ARIPiprazole (ABILIFY) 2 MG tablet Take 1 tablet (2 mg total) by mouth daily.   Marland Kitchen aspirin EC 81 MG EC tablet Take 1 tablet (81 mg total) by mouth daily.   . baclofen (LIORESAL) 10 MG tablet Take 10 mg by mouth 3 (three) times daily.   . budesonide-formoterol (SYMBICORT) 160-4.5 MCG/ACT inhaler Inhale 2 puffs into the lungs 2 (two) times daily.   . cholecalciferol 1000 units tablet Take 1 tablet (1,000 Units total) by mouth daily.   . clonazePAM (KLONOPIN) 1 MG tablet Take 1 tablet (1 mg total) by mouth 3 (three) times daily as needed for anxiety.   . DULoxetine (CYMBALTA) 60 MG capsule Take 60 mg by mouth every morning.    Marland Kitchen ENSURE (ENSURE) Take 237 mLs by mouth 2 (two) times daily between meals.   Marland Kitchen escitalopram (LEXAPRO) 20 MG tablet Take 1 tablet (20 mg total) by mouth daily.   . ferrous sulfate 325 (65 FE) MG tablet Take 1 tablet (325 mg total) by mouth 2 (two) times daily with a meal.   . folic acid (FOLVITE) 1 MG tablet Take 1 tablet (1 mg total) by mouth daily.   . Magnesium 250 MG TABS Take 250 mg by mouth daily.   . nicotine (NICODERM CQ - DOSED IN MG/24 HOURS) 21 mg/24hr patch Place 1 patch (21 mg total) onto the skin daily.   Marland Kitchen nystatin (MYCOSTATIN) 100000 UNIT/ML suspension Take 5 mLs (500,000 Units total) by mouth 4 (four) times daily.   . ondansetron (ZOFRAN) 4 MG tablet Take 1 tablet  (4 mg total) by mouth every 6 (six) hours as needed for nausea.   . Oxycodone HCl 10 MG TABS Take 1 tablet (10 mg total) by mouth every 6 (six) hours as needed.   Marland Kitchen oxyCODONE-acetaminophen (PERCOCET) 10-325 MG tablet Take 1 tablet by mouth every 4 (four) hours as needed. Reported on 04/30/2015 04/30/2015: Received from: External Pharmacy  . pantoprazole (PROTONIX) 40 MG tablet Take 40 mg by mouth 2 (two) times daily.    . polyethylene glycol (MIRALAX / GLYCOLAX) packet Take 17 g by mouth daily.   . pregabalin (LYRICA) 200 MG capsule Take 200 mg by mouth 2 (two) times daily as needed.   . protein supplement (RESOURCE BENEPROTEIN) POWD Take 1 scoop by mouth 2 (two) times daily between meals.   . senna-docusate (SENOKOT-S) 8.6-50 MG tablet Take 1 tablet by mouth at bedtime as needed for mild constipation.   Marland Kitchen tiotropium (SPIRIVA) 18 MCG inhalation capsule Place 1 capsule (18 mcg total) into inhaler and inhale daily.   [name] HFA 108 (90 Base) MCG/ACT inhaler Reported on 05/01/2015 04/30/2015: Received from: External Pharmacy  . vitamin B-12 (CYANOCOBALAMIN) 1000 MCG tablet Take 1,000 mcg by mouth daily.   Marland Kitchen zolpidem (AMBIEN) 10 MG tablet Take 1 tablet (10 mg total) by mouth at bedtime as needed for sleep.   . [DISCONTINUED] ciprofloxacin (CIPRO) 500 MG  tablet Take 1 tablet (500 mg total) by mouth 2 (two) times daily. (Patient taking differently: Take 500 mg by mouth 2 (two) times daily. )    Facility-Administered Encounter Medications as of 05/01/2015  Medication  . [COMPLETED] cyanocobalamin ((VITAMIN B-12)) injection 1,000 mcg  . heparin lock flush 100 unit/mL  . sodium chloride 0.9 % injection 10 mL    Functional Status:  In your present state of health, do you have any difficulty performing the following activities: 04/23/2015 04/18/2015  Hearing? N -  Vision? N -  Difficulty concentrating or making decisions? Y -  Walking or climbing stairs? Y -  Dressing or bathing? Y -  Doing errands,  shopping? Y N    Fall/Depression Screening:  PHQ 2/9 Scores 04/30/2015 12/12/2014 09/06/2014 06/12/2014  PHQ - 2 Score 0 1 0 2  Exception Documentation (No Data) - - -    Assessment: This social worker went to visit patient at the skilled nursing facility, however patient was not there.  Per nursing staff, patient went to have a ultra sound done.  Spoke to [place], discharge planner who states there has been no discharge date set yet for patient. Discharge concerns discussed, related to the ability of her current caretakers to provide the care she needs at home.  The possibility of an Assisted Living also discussed. Per Caryl Pina, she will discuss this with patient upon her return.  Plan: This social worker will follow up with patient on 05/07/15 to assist with discharge planning form the skilled nursing facility.     [name] [facility] Care Management [phone]

## 2015-05-01 NOTE — Telephone Encounter (Signed)
Done.  But not sure what ENT department.

## 2015-05-03 ENCOUNTER — Ambulatory Visit
Admission: RE | Admit: 2015-05-03 | Discharge: 2015-05-03 | Disposition: A | Discharge status: Home / Self Care | Payer: Commercial Managed Care - HMO | Source: Ambulatory Visit | Attending: Hematology and Oncology | Admitting: Hematology and Oncology

## 2015-05-03 ENCOUNTER — Encounter: Payer: Self-pay | Admitting: *Deleted

## 2015-05-03 ENCOUNTER — Other Ambulatory Visit: Payer: Self-pay | Admitting: *Deleted

## 2015-05-03 DIAGNOSIS — R188 Other ascites: Secondary | ICD-10-CM | POA: Insufficient documentation

## 2015-05-03 DIAGNOSIS — D649 Anemia, unspecified: Secondary | ICD-10-CM | POA: Insufficient documentation

## 2015-05-03 DIAGNOSIS — R601 Generalized edema: Secondary | ICD-10-CM | POA: Insufficient documentation

## 2015-05-03 DIAGNOSIS — J90 Pleural effusion, not elsewhere classified: Secondary | ICD-10-CM | POA: Diagnosis not present

## 2015-05-03 DIAGNOSIS — C782 Secondary malignant neoplasm of pleura: Secondary | ICD-10-CM | POA: Insufficient documentation

## 2015-05-03 DIAGNOSIS — K760 Fatty (change of) liver, not elsewhere classified: Secondary | ICD-10-CM | POA: Diagnosis not present

## 2015-05-03 DIAGNOSIS — C210 Malignant neoplasm of anus, unspecified: Secondary | ICD-10-CM | POA: Insufficient documentation

## 2015-05-03 DIAGNOSIS — R590 Localized enlarged lymph nodes: Secondary | ICD-10-CM | POA: Diagnosis not present

## 2015-05-03 DIAGNOSIS — I714 Abdominal aortic aneurysm, without rupture: Secondary | ICD-10-CM | POA: Diagnosis not present

## 2015-05-03 DIAGNOSIS — K449 Diaphragmatic hernia without obstruction or gangrene: Secondary | ICD-10-CM | POA: Insufficient documentation

## 2015-05-03 DIAGNOSIS — I2510 Atherosclerotic heart disease of native coronary artery without angina pectoris: Secondary | ICD-10-CM | POA: Insufficient documentation

## 2015-05-03 DIAGNOSIS — R2243 Localized swelling, mass and lump, lower limb, bilateral: Secondary | ICD-10-CM | POA: Insufficient documentation

## 2015-05-03 DIAGNOSIS — R918 Other nonspecific abnormal finding of lung field: Secondary | ICD-10-CM | POA: Insufficient documentation

## 2015-05-03 DIAGNOSIS — E538 Deficiency of other specified B group vitamins: Secondary | ICD-10-CM | POA: Diagnosis not present

## 2015-05-03 DIAGNOSIS — E46 Unspecified protein-calorie malnutrition: Secondary | ICD-10-CM | POA: Insufficient documentation

## 2015-05-03 LAB — CBC WITH DIFFERENTIAL/PLATELET
BASOS ABS: 0.1 10*3/uL (ref 0–0.1)
BASOS PCT: 1 %
Eosinophils Absolute: 0.9 10*3/uL — ABNORMAL HIGH (ref 0–0.7)
Eosinophils Relative: 13 %
HEMATOCRIT: 24 % — AB (ref 35.0–47.0)
HEMOGLOBIN: 7.9 g/dL — AB (ref 12.0–16.0)
Lymphocytes Relative: 10 %
Lymphs Abs: 0.7 10*3/uL — ABNORMAL LOW (ref 1.0–3.6)
MCH: 28.8 pg (ref 26.0–34.0)
MCHC: 33.1 g/dL (ref 32.0–36.0)
MCV: 87 fL (ref 80.0–100.0)
MONOS PCT: 9 %
Monocytes Absolute: 0.6 10*3/uL (ref 0.2–0.9)
NEUTROS ABS: 4.8 10*3/uL (ref 1.4–6.5)
NEUTROS PCT: 67 %
Platelets: 265 10*3/uL (ref 150–440)
RBC: 2.76 MIL/uL — AB (ref 3.80–5.20)
RDW: 18.7 % — ABNORMAL HIGH (ref 11.5–14.5)
WBC: 7.2 10*3/uL (ref 3.6–11.0)

## 2015-05-03 LAB — BASIC METABOLIC PANEL
ANION GAP: 6 (ref 5–15)
BUN: 11 mg/dL (ref 6–20)
CHLORIDE: 99 mmol/L — AB (ref 101–111)
CO2: 29 mmol/L (ref 22–32)
Calcium: 8.6 mg/dL — ABNORMAL LOW (ref 8.9–10.3)
Creatinine, Ser: 0.89 mg/dL (ref 0.44–1.00)
GFR calc non Af Amer: >60 mL/min (ref >60)
Glucose, Bld: 86 mg/dL (ref 65–99)
Potassium: 3.9 mmol/L (ref 3.5–5.1)
Sodium: 134 mmol/L — ABNORMAL LOW (ref 135–145)

## 2015-05-03 LAB — GLUCOSE, CAPILLARY: Glucose-Capillary: 92 mg/dL (ref 65–99)

## 2015-05-03 MED ORDER — FLUDEOXYGLUCOSE F - 18 (FDG) INJECTION
12.8700 | Freq: Once | INTRAVENOUS | Status: AC | PRN
  Administered 2015-05-03: 12.87 via INTRAVENOUS

## 2015-05-03 NOTE — Patient Outreach (Signed)
[facility]) Care Management  [facility] Social Work  05/03/2015  [name] [birth year] [id number]  Subjective:  Patient is a 60 year old female currently in rehabilitation at [facility]  Objective:   Encounter Medications:  Outpatient Encounter Prescriptions as of 05/03/2015  Medication Sig Note  . albuterol (PROVENTIL) (2.5 MG/3ML) 0.083% nebulizer solution Take 3 mLs (2.5 mg total) by nebulization every 4 (four) hours. (Patient taking differently: Take 2.5 mg by nebulization every 4 (four) hours as needed for wheezing or shortness of breath. )   . ARIPiprazole (ABILIFY) 2 MG tablet Take 1 tablet (2 mg total) by mouth daily.   Marland Kitchen aspirin EC 81 MG EC tablet Take 1 tablet (81 mg total) by mouth daily.   . baclofen (LIORESAL) 10 MG tablet Take 10 mg by mouth 3 (three) times daily.   . budesonide-formoterol (SYMBICORT) 160-4.5 MCG/ACT inhaler Inhale 2 puffs into the lungs 2 (two) times daily.   . cholecalciferol 1000 units tablet Take 1 tablet (1,000 Units total) by mouth daily.   . clonazePAM (KLONOPIN) 1 MG tablet Take 1 tablet (1 mg total) by mouth 3 (three) times daily as needed for anxiety.   . DULoxetine (CYMBALTA) 60 MG capsule Take 60 mg by mouth every morning.    Marland Kitchen ENSURE (ENSURE) Take 237 mLs by mouth 2 (two) times daily between meals.   Marland Kitchen escitalopram (LEXAPRO) 20 MG tablet Take 1 tablet (20 mg total) by mouth daily.   . ferrous sulfate 325 (65 FE) MG tablet Take 1 tablet (325 mg total) by mouth 2 (two) times daily with a meal.   . folic acid (FOLVITE) 1 MG tablet Take 1 tablet (1 mg total) by mouth daily.   . Magnesium 250 MG TABS Take 250 mg by mouth daily.   . nicotine (NICODERM CQ - DOSED IN MG/24 HOURS) 21 mg/24hr patch Place 1 patch (21 mg total) onto the skin daily.   Marland Kitchen nystatin (MYCOSTATIN) 100000 UNIT/ML suspension Take 5 mLs (500,000 Units total) by mouth 4 (four) times daily.   . ondansetron (ZOFRAN) 4 MG tablet Take 1 tablet  (4 mg total) by mouth every 6 (six) hours as needed for nausea.   . Oxycodone HCl 10 MG TABS Take 1 tablet (10 mg total) by mouth every 6 (six) hours as needed.   Marland Kitchen oxyCODONE-acetaminophen (PERCOCET) 10-325 MG tablet Take 1 tablet by mouth every 4 (four) hours as needed. Reported on 04/30/2015 04/30/2015: Received from: External Pharmacy  . pantoprazole (PROTONIX) 40 MG tablet Take 40 mg by mouth 2 (two) times daily.    . polyethylene glycol (MIRALAX / GLYCOLAX) packet Take 17 g by mouth daily.   . pregabalin (LYRICA) 200 MG capsule Take 200 mg by mouth 2 (two) times daily as needed.   . protein supplement (RESOURCE BENEPROTEIN) POWD Take 1 scoop by mouth 2 (two) times daily between meals.   . senna-docusate (SENOKOT-S) 8.6-50 MG tablet Take 1 tablet by mouth at bedtime as needed for mild constipation.   Marland Kitchen tiotropium (SPIRIVA) 18 MCG inhalation capsule Place 1 capsule (18 mcg total) into inhaler and inhale daily.   [name] HFA 108 (90 Base) MCG/ACT inhaler Reported on 05/01/2015 04/30/2015: Received from: External Pharmacy  . vitamin B-12 (CYANOCOBALAMIN) 1000 MCG tablet Take 1,000 mcg by mouth daily.   Marland Kitchen zolpidem (AMBIEN) 10 MG tablet Take 1 tablet (10 mg total) by mouth at bedtime as needed for sleep.    Facility-Administered Encounter Medications as of  05/03/2015  Medication  . heparin lock flush 100 unit/mL  . sodium chloride 0.9 % injection 10 mL    Functional Status:  In your present state of health, do you have any difficulty performing the following activities: 05/03/2015 04/23/2015  Hearing? N N  Vision? N N  Difficulty concentrating or making decisions? [name]  Walking or climbing stairs? Y Y  Dressing or bathing? Y Y  Doing errands, shopping? [name]  Preparing Food and eating ? Y -  Using the Toilet? N -  In the past six months, have you accidently leaked urine? Y -  Do you have problems with loss of bowel control? Y -  Managing your Medications? Y -  Managing your Finances? Y -   Housekeeping or managing your Housekeeping? N -    Fall/Depression Screening:  PHQ 2/9 Scores 05/03/2015 04/30/2015 12/12/2014 09/06/2014 06/12/2014  PHQ - 2 Score 0 0 1 0 2  Exception Documentation - (No Data) - - -    Assessment:  Patient was waiting for her transportation to a medical appointment when this social worker arrived.  Patient had been given pain medication and appeared fatigued and a bit confused.  Patient was able to report her participation in physical and occupational therapy.  She also discussed that she would be returning home with her friend and her friends husband who have been providing care for her.  No discharge date has been set yet, Caryl Pina (discharge planner) was not in the office at the time of this visit.   Plan: This Education officer, museum will discuss discharge plan with [name](discharge planner)  and will also follow up with her caregivers to confirm her return to their home and to access for   discharge needs.    [name] [facility] Care Management [phone]

## 2015-05-03 NOTE — Progress Notes (Signed)
[facility] day:  05/01/2015  Chief Complaint: [name] is a 60 y.o. 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  HPI:  The patient was last seen in the medical oncology clinic on 03/12/2015.  At that time, she was seen for 2 month assessment.  She noted chronic abdominal pain and blood in her ostomy.  She had new 2-3+ bilateral lower extremity edema.  Duplex on 03/11/2015 revealed no evidence of DVT.  Abdominal and pelvic CT scan on 03/15/2015 revealed a stable ostomy site, ascites, increase in subcutaneous edema, loculated right basilar pleural effusion with nodular densities along the pleural margin, thickened loops of juejunum, a 4 x 4.5 cm infrarenal abdominal aortic aneurysm, and stable compression fractures of T9, T10, and T12.  She was admitted to [facility] from 03/15/2015 - 03/18/2015 with recurrent pneumonia. She presented with fever and dyspnea. She was treated with antibiotics and discharged on Augmentin. CXR on 04/08/2015 revealed a stable large right sided pleural effusion with compressive atelectasis.  She was readmitted to [facility] from 04/08/2015 - 04/17/2015 with worsening shortness of breath.  On 04/10/2015, she underwent right thoracotomy, with drainage of loculated right pleural effusion, right pleural biopsy, and talc pleurodesis by Dr. Nestor Lewandowsky. Pathology revealed fragments of metastatic squamous cellcarcinoma.  While hospitalized, she was transfused with 1 unit of PRBCs on 04/13/2015 to a hemoglobin of 9.1.  She was discharged home on 3 liters O2 via nasal cannula.  Echo on 04/13/2015 revealed an EF of 60-65% with normal wall motion.  She was admitted to [facility] from 04/17/2015 - 04/21/2015 with GI bleed and melena.  She underwent EGD and colonoscopy on 04/19/2015 by Dr. Verdie Shire.  Colonoscopy revealed diverticulosis in the sigmoid colon.  EGD  was normal.  The etiology was unclear.  She was discharged on ciprofloxacin and Flagyl.  She also had an E coli and Klebsiella UTI sensitive to ciprofloxacin.  She was admitted to [facility] from 04/22/2015 - 04/24/2015 with abdominal pain, generalized weakness and hypokalemia.  She was discharged to a skilled nursing facility [facility]).  Symptomatically, she is extremely fatigued.  She notes working on her strength and endurance with physical therapy.  She needs assistance with getting up and activities of daily living.  She can walk with a walker.  She spends 80% of her time in bed.  She has chronic back pain due to compression fractures.  She notes a future plan for kyphoplasty.   Past Medical History  Diagnosis Date  . Schizophrenia ([place])   . Asthma   . GERD (gastroesophageal reflux disease)   . Anxiety   . Depression   . Bipolar disorder ([place])   . COPD (chronic obstructive pulmonary disease) ([place])   . Occasional tremors     right hand  . PTSD (post-traumatic stress disorder)   . Shortness of breath dyspnea   . Fibromyalgia   . DVT (deep venous thrombosis) ([place]) 2011    RUE  . Thyroid nodule   . DDD (degenerative disc disease), lumbar   . Spinal stenosis   . Peripheral neuropathy ([place])   . Rotator cuff tear     right  . Pneumonia 2011  . Hypothyroidism     no meds currently  . Anemia     during pregnancy only  . Hypertension     Off meds x 15 years-well controlled now per pt  . Severe obstructive sleep apnea  06/27/2014  . DVT of upper extremity (deep vein thrombosis) ([place]) 10/14/2014  . Squamous cell cancer, anus (HCC)   . Lung cancer ([place])   . Hemorrhoid 06/27/2014  . SBO (small bowel obstruction) [facility])     Past Surgical History  Procedure Laterality Date  . Foot surgery Right   . Tubal ligation    . Eye surgery Bilateral   . Mouth surgery  2002  . Rectal biopsy N/A 05/08/2014    Procedure: BIOPSY RECTAL;  Surgeon: Marlyce Huge, MD;  Location: [facility] ORS;   Service: General;  Laterality: N/A;  . Evaluation under anesthesia with hemorrhoidectomy N/A 05/08/2014    Procedure: EXAM UNDER ANESTHESIA WITH HEMORRHOIDECTOMY;  Surgeon: Marlyce Huge, MD;  Location: [facility] ORS;  Service: General;  Laterality: N/A;  . Portacath placement N/A 05/20/2014    Procedure: INSERTION PORT-A-CATH;  Surgeon: Florene Glen, MD;  Location: [facility] ORS;  Service: General;  Laterality: N/A;  . Laparoscopic diverted colostomy N/A 05/26/2014    Procedure: LAPAROSCOPIC DIVERTED COLOSTOMY;  Surgeon: Marlyce Huge, MD;  Location: [facility] ORS;  Service: General;  Laterality: N/A;  . Peripheral vascular catheterization Left 10/16/2014    Procedure: Upper Extremity Venography with thrombectomy, port removal;  Surgeon: Algernon Huxley, MD;  Location: [place] CV LAB;  Service: Cardiovascular;  Laterality: Left;  . Peripheral vascular catheterization  10/16/2014    Procedure: Upper Extremity Intervention;  Surgeon: Algernon Huxley, MD;  Location: [place] CV LAB;  Service: Cardiovascular;;  . Video assisted thoracoscopy (vats)/thorocotomy Right 04/10/2015    Procedure: PRE-OP BRONCHOSCOPY, VIDEO ASSISTED THORACOSCOPY RIGHT WITH PLEURAL BIOPSIES, & PLEURAL DESIS;  Surgeon: Nestor Lewandowsky, MD;  Location: [facility] ORS;  Service: Thoracic;  Laterality: Right;  . Esophagogastroduodenoscopy N/A 04/19/2015    Procedure: ESOPHAGOGASTRODUODENOSCOPY (EGD);  Surgeon: Hulen Luster, MD;  Location: [facility] ENDOSCOPY;  Service: Endoscopy;  Laterality: N/A;  . Colonoscopy N/A 04/19/2015    Procedure: COLONOSCOPY;  Surgeon: Hulen Luster, MD;  Location: [facility] ENDOSCOPY;  Service: Endoscopy;  Laterality: N/A;    Family History  Problem Relation Age of Onset  . Cancer Maternal Aunt   . Cancer Paternal Uncle   . Diabetes Mother   . Thyroid disease Mother   . Kidney failure Mother   . Hypertension Mother   . Depression Mother   . Mental illness Son   . Aneurysm Maternal Grandmother   . Thyroid disease  Maternal Grandmother   . Stroke Maternal Grandfather   . Hypertension Maternal Grandfather   . Diabetes Maternal Grandfather   . Heart disease Maternal Grandfather     MI  . Nephrolithiasis Daughter     Social History:  reports that she quit smoking about 5 weeks ago. Her smoking use included Cigarettes. She started smoking about 44 years ago. She has a 4.4 pack-year smoking history. She has never used smokeless tobacco. She reports that she does not drink alcohol or use illicit drugs.  She currently lives at [facility].  The patient is alone today.  Allergies:  Allergies  Allergen Reactions  . Fish Allergy Anaphylaxis    Patient allergic to perch only. She can eat other fish.  . Sulfa Antibiotics Hives    Current Medications: Current Outpatient Prescriptions  Medication Sig Dispense Refill  . albuterol (PROVENTIL) (2.5 MG/3ML) 0.083% nebulizer solution Take 3 mLs (2.5 mg total) by nebulization every 4 (four) hours. (Patient taking differently: Take 2.5 mg by nebulization every 4 (four) hours as needed for wheezing or shortness of breath. )  360 vial 12  . ARIPiprazole (ABILIFY) 2 MG tablet Take 1 tablet (2 mg total) by mouth daily. 30 tablet 4  . aspirin EC 81 MG EC tablet Take 1 tablet (81 mg total) by mouth daily. 30 tablet 1  . baclofen (LIORESAL) 10 MG tablet Take 10 mg by mouth 3 (three) times daily.    . budesonide-formoterol (SYMBICORT) 160-4.5 MCG/ACT inhaler Inhale 2 puffs into the lungs 2 (two) times daily. 1 Inhaler 12  . cholecalciferol 1000 units tablet Take 1 tablet (1,000 Units total) by mouth daily. 30 tablet 0  . clonazePAM (KLONOPIN) 1 MG tablet Take 1 tablet (1 mg total) by mouth 3 (three) times daily as needed for anxiety. 90 tablet 4  . DULoxetine (CYMBALTA) 60 MG capsule Take 60 mg by mouth every morning.     Marland Kitchen ENSURE (ENSURE) Take 237 mLs by mouth 2 (two) times daily between meals.    Marland Kitchen escitalopram (LEXAPRO) 20 MG tablet Take 1 tablet (20 mg total) by mouth daily.  30 tablet 4  . ferrous sulfate 325 (65 FE) MG tablet Take 1 tablet (325 mg total) by mouth 2 (two) times daily with a meal. 60 tablet 0  . folic acid (FOLVITE) 1 MG tablet Take 1 tablet (1 mg total) by mouth daily. 30 tablet 3  . Magnesium 250 MG TABS Take 250 mg by mouth daily.    . nicotine (NICODERM CQ - DOSED IN MG/24 HOURS) 21 mg/24hr patch Place 1 patch (21 mg total) onto the skin daily. 28 patch 0  . nystatin (MYCOSTATIN) 100000 UNIT/ML suspension Take 5 mLs (500,000 Units total) by mouth 4 (four) times daily. 60 mL 2  . ondansetron (ZOFRAN) 4 MG tablet Take 1 tablet (4 mg total) by mouth every 6 (six) hours as needed for nausea. 20 tablet 0  . Oxycodone HCl 10 MG TABS Take 1 tablet (10 mg total) by mouth every 6 (six) hours as needed. 120 tablet 0  . oxyCODONE-acetaminophen (PERCOCET) 10-325 MG tablet Take 1 tablet by mouth every 4 (four) hours as needed. Reported on 04/30/2015    . pantoprazole (PROTONIX) 40 MG tablet Take 40 mg by mouth 2 (two) times daily.     . polyethylene glycol (MIRALAX / GLYCOLAX) packet Take 17 g by mouth daily. 14 each 0  . pregabalin (LYRICA) 200 MG capsule Take 200 mg by mouth 2 (two) times daily as needed.    . protein supplement (RESOURCE BENEPROTEIN) POWD Take 1 scoop by mouth 2 (two) times daily between meals.    . senna-docusate (SENOKOT-S) 8.6-50 MG tablet Take 1 tablet by mouth at bedtime as needed for mild constipation. 30 tablet 5  . tiotropium (SPIRIVA) 18 MCG inhalation capsule Place 1 capsule (18 mcg total) into inhaler and inhale daily. 30 capsule 12  . vitamin B-12 (CYANOCOBALAMIN) 1000 MCG tablet Take 1,000 mcg by mouth daily.    Marland Kitchen zolpidem (AMBIEN) 10 MG tablet Take 1 tablet (10 mg total) by mouth at bedtime as needed for sleep. 30 tablet 4  . VENTOLIN HFA 108 (90 Base) MCG/ACT inhaler Reported on 05/01/2015    . [DISCONTINUED] apixaban (ELIQUIS) 5 MG TABS tablet Take 1 tablet (5 mg total) by mouth 2 (two) times daily. 60 tablet 2   No current  facility-administered medications for this visit.   Facility-Administered Medications Ordered in Other Visits  Medication Dose Route Frequency Provider Last Rate Last Dose  . heparin lock flush 100 unit/mL  500 Units Intravenous Once Melissa C  Corcoran, MD      . sodium chloride 0.9 % injection 10 mL  10 mL Intravenous PRN Lequita Asal, MD        Review of Systems:  GENERAL:  Fatigue.  No fevers or sweats.  Weight up secondary to fluid. PERFORMANCE STATUS (ECOG):  3-4 HEENT:  No visual changes, runny nose, mouth sores or sore throat. Lungs: Shortness of breath improved with oxygen.  No cough.  No hemoptysis. Cardiac:  No chest pain, palpitations, orthopnea, or PND. GI:  Colostomy.  No appetite.  Occasional nausea.  No nausea, constipation, melena or hematochezia. GU:  No urgency, frequency, dysuria, or hematuria. Musculoskeletal:  Back pain with 3 compression fractures (old).  No joint pain.  No muscle tenderness. Extremities:  Significant swelling in lower legs.  Slight left arm swelling. Skin:  No rashes or ulcers. Neuro:  No headache, numbness or weakness, balance or coordination issues. Endocrine:  No diabetes, thyroid issues, hot flashes or night sweats. Psych:  No mood changes or depression.  Pain:  Back pain 8 of 10.  Going to pain clinic.  Kyphoplasty being considered. Review of systems:  All other systems reviewed and found to be negative.   Physical Exam: Blood pressure 146/80, pulse 88, temperature 96.6 F (35.9 C), temperature source Tympanic, resp. rate 18, height '5\' 6"'$  (1.676 m), weight 221 lb 9 oz (100.5 kg).  GENERAL:  Chronically ill appearing woman sitting comfortably, at times dozing, in a wheelchair in the exam room in no acute distress.  MENTAL STATUS:  Alert and oriented to person, place and time. HEAD:  Long blonde/graying hair.  Normocephalic, atraumatic, face symmetric, no Cushingoid features. EYES:  Blue eyes.  Pupils equal round and reactive to light  and accomodation.  No conjunctivitis or scleral icterus. ENT:  [place] in place.  Oropharynx clear without lesion.  Tongue normal. Mucous membranes moist.  RESPIRATORY:  Clear to auscultation without rales, wheezes or rhonchi. CARDIOVASCULAR:  Regular rate and rhythm without murmur, rub or gallop. ABDOMEN:  Ostomy.  Stool liquid brown (no blood).  Soft, non-tender with active bowel sounds and no hepatosplenomegaly.  No masses. LYMPH NODES:  No palpable cervical, supraclavicular, axillary or inguinal adenopathy. SKIN:  Upper extremity bruises.  No other rashes, ulcers or lesions. EXTREMITIES: Bilateral 3+ pitting lower extremity edema (left > right) extending to hips.  No skin discoloration or tenderness.  No palpable cords. NEUROLOGICAL: Unremarkable. PSYCH:  Appropriate.   Appointment on 05/01/2015  Component Date Value Ref Range Status  . Ferritin 05/01/2015 184  11 - 307 ng/mL Final  . Iron 05/01/2015 25* 28 - 170 ug/dL Final  . TIBC 05/01/2015 224* 250 - 450 ug/dL Final  . Saturation Ratios 05/01/2015 11  10.4 - 31.8 % Final  . UIBC 05/01/2015 199   Final  . Folate 05/01/2015 27.0  >5.9 ng/mL Final  . Retic Ct Pct 05/01/2015 4.2* 0.4 - 3.1 % Final  . RBC. 05/01/2015 2.92* 3.80 - 5.20 MIL/uL Final  . Retic Count, Manual 05/01/2015 122.6  19.0 - 183.0 K/uL Final    Assessment:  [name] is a 60 y.o. female with stage IV anal cancer.  She presented in 05/2014 with a 6 month history of an 80 pound weight and a 2 month history of fecal incontinence and progressive pain.  Chest, abdomen, and pelvic CT scan on 05/19/2014 revealed no evidence of metastatic disease.  There was a 4 mm RUL pulmonary nodule.  There were borderline (13 mm) enlarged  iliac nodes.  There was a 4.7 x 4.1 x 6.0 cm lower rectal and anal mass consistent with anal cancer.  She underwent diverting colostomy on 05/26/2014.    She received concurrent chemotherapy (5FU and mitomycin-C) and radiation (06/29/2014 -  07/31/2014).  She missed 3 days of radiation (07/04-07/06/2014).  She has been off radiation since 08/23/2014 secondary to perirectal breakdown.  She received hyperbaric oxygen.  She was seen by Dr. Phoebe Perch on 01/02/2015.  Closure of her ostomy was not recommended for at least 6 months.    She has macrocytic RBC indices.  Work-up on 09/28/2014 revealed a B12 deficiency (169) and folate deficiency (5.2).  TSH was normal.  She receives B12 (last 03/12/2015).  She is supposed to take folic acid (initially not taking secondary to costs).  She developed a left upper extremity DVT on 10/14/2014.  She underwent thrombolysis, thrombectomy, and port removal on 10/16/2014.  She was on Coumadin.  Left upper extremity duplex on 03/02/2015 revealed resolved thrombus in the right internal jugular, subclavian and axillary veins.  There was a small amount of residual thrombus in the proximal right brachial vein over a short segment (very low thrombus burden).  Coumadin was discontinued during her 03/05/2015 admission.  She was admitted to [facility] from 02/26/2015 - 03/03/2015 with a COPD exacerbation, RLL pneumonia, and small bowel obstruction.  She was treated with antibiotics and steroids.  Her small bowel obstruction resolved with conservative management.    She was admitted to [facility] from 03/05/2015 - 03/07/2015 with health care associated pneumonia.  She was treated with Zosyn and Levaquin and discharged on Levaquin and prednisone.  She was discharged on oxygen 2 liters/min vi nasal cannula to maintain sats > 90%.    She was admitted to [facility] from 03/15/2015 - 03/18/2015 with recurrent pneumonia. She presented with fever and dyspnea. She was treated with antibiotics and discharged on Augmentin. CXR on 04/08/2015 revealed a stable large right sided pleural effusion with compressive atelectasis.  She was admitted to [facility] from 04/08/2015 - 04/17/2015 with progressive shortness of breath and a large right sided  pleural effusion. Right pleural biopsy and talc pleurodesis on 04/10/2015 revealed fragments of metastatic squamous cell carcinoma.  She was admitted to [facility] from 04/17/2015 - 04/21/2015 with GI bleed and melena.  EGD and colonoscopy on 04/19/2015 revealed only diverticulosis in the sigmoid colon.  The etiology of her GI bleeding was unclear.  She was discharged on ciprofloxacin and Flagyl.  She also had an E coli and Klebsiella UTI.  She was admitted to [facility] from 04/22/2015 - 04/24/2015 with abdominal pain, generalized weakness and hypokalemia.  She was discharged to a skilled nursing facility.  Bilateral lower extremity duplex on 03/11/2015 revealed no evidence of DVT.   Symptomatically, she is extremely debilitated.  Nutritional status is poor (albumen 1.7).  She has anemia likely secondary to recent GI bleed and chronic disease.  She has 3+ bilateral lower extremity edema (left > right).  Plan: 1.  Review multiple admissions, recent diagnosis of metastatic anal cancer, general debilitation, and potential treatment plan.  Discuss importance of regaining strength, increased nutrition, and improvement in performance status.  Discuss chemotherapy with continuous infusion 5-fluorouracil x 5 days and cisplatin on day 2 every 28 days.  Discuss side effects associated with chemotherapy including myelosuppression, nausea, vomiting, electrolyte wasting (potassium and magnesium), and high frequency hearing loss.  Discuss need for port-a-cath given continuous infusion 5-FU.  Discuss consideration of low dose Coumadin given prior history of  catheter associated thrombosis.  Discuss baseline audiogram.  Discuss concern about present debilitation.  Discuss lower extremity duplex to r/o DVT.  2.  Bilateral lower extremity duplex- r/o DVT. 3.  Nutritional consult at care facility.  Continue physical therapy. 4.  Consult Dr. Lucky Cowboy re: port-a-cath placement.  Consider low dose Coumadin secondary to history of catheter  associated thrombosis. 5.  Audiogram pre: cisplatin. 6.  Preauth cisplatin and 5-fluorouracil. 7.  PET scan:  baseline prior to initiation of therapy. 8.  B12 today and monthly.  Continue folic acid daily. 9.  Labs today:  Ferritin, iron studies, folate, retic. 10.  RTC in 2 week for MD reassessment, labs (CBC with diff, CMP, Mg) and cisplatin and 5-fluorouracil.   Lequita Asal, MD  05/01/2015

## 2015-05-05 LAB — COMPLIANCE DRUG ANALYSIS, UR: PDF: 0

## 2015-05-07 ENCOUNTER — Encounter
Admission: RE | Admit: 2015-05-07 | Discharge: 2015-05-07 | Disposition: A | Discharge status: Home / Self Care | Payer: Commercial Managed Care - HMO | Source: Ambulatory Visit | Attending: Internal Medicine | Admitting: Internal Medicine

## 2015-05-08 ENCOUNTER — Other Ambulatory Visit: Payer: Self-pay | Admitting: *Deleted

## 2015-05-08 ENCOUNTER — Telehealth: Payer: Self-pay | Admitting: *Deleted

## 2015-05-08 ENCOUNTER — Encounter: Payer: Self-pay | Admitting: Pulmonary Disease

## 2015-05-08 ENCOUNTER — Inpatient Hospital Stay: Payer: Commercial Managed Care - HMO | Admitting: Unknown Physician Specialty

## 2015-05-08 ENCOUNTER — Ambulatory Visit (INDEPENDENT_AMBULATORY_CARE_PROVIDER_SITE_OTHER): Payer: Commercial Managed Care - HMO | Admitting: Pulmonary Disease

## 2015-05-08 VITALS — BP 142/82 | HR 110 | Ht 66.5 in | Wt 199.0 lb

## 2015-05-08 DIAGNOSIS — C799 Secondary malignant neoplasm of unspecified site: Secondary | ICD-10-CM | POA: Diagnosis not present

## 2015-05-08 DIAGNOSIS — C801 Malignant (primary) neoplasm, unspecified: Secondary | ICD-10-CM

## 2015-05-08 DIAGNOSIS — J90 Pleural effusion, not elsewhere classified: Secondary | ICD-10-CM

## 2015-05-08 DIAGNOSIS — R0600 Dyspnea, unspecified: Secondary | ICD-10-CM

## 2015-05-08 DIAGNOSIS — Z87891 Personal history of nicotine dependence: Secondary | ICD-10-CM | POA: Diagnosis not present

## 2015-05-08 DIAGNOSIS — IMO0002 Reserved for concepts with insufficient information to code with codable children: Secondary | ICD-10-CM

## 2015-05-08 DIAGNOSIS — J449 Chronic obstructive pulmonary disease, unspecified: Secondary | ICD-10-CM

## 2015-05-08 NOTE — Telephone Encounter (Signed)
I called the vascular office and they do not have her scheduled yet. Mickel Baas at the vascular office is out sick and Deal[employer] and the person covering is all gone.  I left message with Mickel Baas and asked if got sch., yet.  Also I called ENT and they finally did get the ref for audiogram and the appt is 5/3 at 8:30.  I called [facility]od and let them know and they can get her to the appt.  I spoke to [place] the nurse and Jolayne Haines the transportation person.  I also called the pt's house and left them a message that pt had the appt for tom.

## 2015-05-08 NOTE — Patient Outreach (Addendum)
[name] [facility]) Care Management  [facility] Social Work  05/08/2015  [name] Feb 08, 1955 [id number]  Subjective:  Patient is a 60 year old female currently in rehabilitation at [facility].  Objective:   Encounter Medications:  Outpatient Encounter Prescriptions as of 05/08/2015  Medication Sig Note  . albuterol (PROVENTIL) (2.5 MG/3ML) 0.083% nebulizer solution Take 3 mLs (2.5 mg total) by nebulization every 4 (four) hours. (Patient taking differently: Take 2.5 mg by nebulization every 4 (four) hours as needed for wheezing or shortness of breath. )   . ARIPiprazole (ABILIFY) 2 MG tablet Take 1 tablet (2 mg total) by mouth daily.   Marland Kitchen aspirin EC 81 MG EC tablet Take 1 tablet (81 mg total) by mouth daily.   . baclofen (LIORESAL) 10 MG tablet Take 10 mg by mouth 3 (three) times daily.   . budesonide-formoterol (SYMBICORT) 160-4.5 MCG/ACT inhaler Inhale 2 puffs into the lungs 2 (two) times daily.   . cholecalciferol 1000 units tablet Take 1 tablet (1,000 Units total) by mouth daily.   . clonazePAM (KLONOPIN) 1 MG tablet Take 1 tablet (1 mg total) by mouth 3 (three) times daily as needed for anxiety.   . DULoxetine (CYMBALTA) 60 MG capsule Take 60 mg by mouth every morning.    Marland Kitchen ENSURE (ENSURE) Take 237 mLs by mouth 2 (two) times daily between meals.   Marland Kitchen escitalopram (LEXAPRO) 20 MG tablet Take 1 tablet (20 mg total) by mouth daily.   . ferrous sulfate 325 (65 FE) MG tablet Take 1 tablet (325 mg total) by mouth 2 (two) times daily with a meal.   . folic acid (FOLVITE) 1 MG tablet Take 1 tablet (1 mg total) by mouth daily.   . Magnesium 250 MG TABS Take 250 mg by mouth daily.   . nicotine (NICODERM CQ - DOSED IN MG/24 HOURS) 21 mg/24hr patch Place 1 patch (21 mg total) onto the skin daily.   Marland Kitchen nystatin (MYCOSTATIN) 100000 UNIT/ML suspension Take 5 mLs (500,000 Units total) by mouth 4 (four) times daily.   . ondansetron (ZOFRAN) 4 MG tablet Take 1 tablet (4 mg total) by mouth every  6 (six) hours as needed for nausea.   . Oxycodone HCl 10 MG TABS Take 1 tablet (10 mg total) by mouth every 6 (six) hours as needed.   Marland Kitchen oxyCODONE-acetaminophen (PERCOCET) 10-325 MG tablet Take 1 tablet by mouth every 4 (four) hours as needed. Reported on 04/30/2015 04/30/2015: Received from: External Pharmacy  . pantoprazole (PROTONIX) 40 MG tablet Take 40 mg by mouth 2 (two) times daily.    . polyethylene glycol (MIRALAX / GLYCOLAX) packet Take 17 g by mouth daily.   . protein supplement (RESOURCE BENEPROTEIN) POWD Take 1 scoop by mouth 2 (two) times daily between meals.   . senna-docusate (SENOKOT-S) 8.6-50 MG tablet Take 1 tablet by mouth at bedtime as needed for mild constipation.   Marland Kitchen tiotropium (SPIRIVA) 18 MCG inhalation capsule Place 1 capsule (18 mcg total) into inhaler and inhale daily.   [name] HFA 108 (90 Base) MCG/ACT inhaler Reported on 05/01/2015 04/30/2015: Received from: External Pharmacy  . vitamin B-12 (CYANOCOBALAMIN) 1000 MCG tablet Take 1,000 mcg by mouth daily.   Marland Kitchen zolpidem (AMBIEN) 10 MG tablet Take 1 tablet (10 mg total) by mouth at bedtime as needed for sleep.    Facility-Administered Encounter Medications as of 05/08/2015  Medication  . heparin lock flush 100 unit/mL  . sodium chloride 0.9 % injection 10 mL  Functional Status:  In your present state of health, do you have any difficulty performing the following activities: 05/03/2015 04/23/2015  Hearing? N N  Vision? N N  Difficulty concentrating or making decisions? [name]  Walking or climbing stairs? Y Y  Dressing or bathing? Y Y  Doing errands, shopping? [name]  Preparing Food and eating ? Y -  Using the Toilet? N -  In the past six months, have you accidently leaked urine? Y -  Do you have problems with loss of bowel control? Y -  Managing your Medications? Y -  Managing your Finances? Y -  Housekeeping or managing your Housekeeping? N -    Fall/Depression Screening:  PHQ 2/9 Scores 05/03/2015 04/30/2015  12/12/2014 09/06/2014 06/12/2014  PHQ - 2 Score 0 0 1 0 2  Exception Documentation - (No Data) - - -    Assessment: This social worker went to visit patient at [name] and rehabilitation.  Patient was not there upon my arrival, she had been transported to a medical appointment.  This social worker spoke with discharge planner, [name] who stated that by choice patient will returning back home with her friend and husband on Thursday 05/10/15 .  Patient will discharge with Physical and Occupational Therapy through [facility] care.  Plan:  This social worker will inform RNCM and will follow up with patient  post discharge from rehabilitaton.    [name] [facility] Care Management [phone]

## 2015-05-09 ENCOUNTER — Other Ambulatory Visit: Payer: Self-pay | Admitting: *Deleted

## 2015-05-09 ENCOUNTER — Other Ambulatory Visit: Payer: Self-pay | Admitting: Vascular Surgery

## 2015-05-09 NOTE — Patient Outreach (Signed)
[facility]) Care Management  05/09/2015  [name] [birth year] [phone]   Phone call to family friend Selena Batten, (onTHN consent) to discuss patients discharge needs from the skilled nursing facility. Patient will be discharging home to Ms. Perry's home with home health physical therapy and occupational therapy.  Voicemail message left requesting a return call.   [name] [facility] Care Management [phone]

## 2015-05-09 NOTE — Progress Notes (Signed)
PROBLEMS: Metastatic squamous cell carcinoma Pleural effusion - s/p talc pleurodesis Dyspnea - improved Former smoker Chronic obstructive pulmonary disease, unspecified COPD type ([place])   SUBJ: Has been through a lot recently including multiple hospitalizations. Feels she is getting stronger. Still has some R sided CP after pleurodesis. Has quit smoking. Rarely uses albuterol. Denies fever, purulent sputum, hemoptysis, LE edema and calf tenderness  OBJ: Filed Vitals:   05/08/15 0957  BP: 142/82  Pulse: 110  Height: 5' 6.5" (1.689 m)  Weight: 199 lb (90.266 kg)  SpO2: 95%   Frail, tremulous, in WC, [place] O2 @ 2 lpm HEENT WNL No JVD noted  Decreased BS in R base, no adventitious sounds, no wheezes RRR s M NABS, soft, NT 2+ symmetric pedal and ankle edema    DATA: Most recent CXR reviewed  IMPRESSION: Metastatic squamous cell carcinoma (HCC)  Pleural effusion  Dyspnea  Former smoker  Chronic obstructive pulmonary disease, unspecified COPD type (HCC)  Chronic hypoxemia  PLAN: Continue current COPD regimen Cont supplemental O2 ROV 3 months or as needed  Merton Border, MD PCCM service Mobile [phone] Pager [phone] 05/09/2015

## 2015-05-14 ENCOUNTER — Other Ambulatory Visit: Payer: Self-pay | Admitting: *Deleted

## 2015-05-14 ENCOUNTER — Encounter
Admission: RE | Disposition: A | Discharge status: Home / Self Care | Payer: Self-pay | Source: Ambulatory Visit | Attending: Vascular Surgery

## 2015-05-14 ENCOUNTER — Ambulatory Visit
Admission: RE | Admit: 2015-05-14 | Discharge: 2015-05-14 | Disposition: A | Discharge status: Home / Self Care | Payer: Commercial Managed Care - HMO | Source: Ambulatory Visit | Attending: Vascular Surgery | Admitting: Vascular Surgery

## 2015-05-14 DIAGNOSIS — Z7982 Long term (current) use of aspirin: Secondary | ICD-10-CM | POA: Diagnosis not present

## 2015-05-14 DIAGNOSIS — K219 Gastro-esophageal reflux disease without esophagitis: Secondary | ICD-10-CM | POA: Insufficient documentation

## 2015-05-14 DIAGNOSIS — Z79899 Other long term (current) drug therapy: Secondary | ICD-10-CM | POA: Insufficient documentation

## 2015-05-14 DIAGNOSIS — C210 Malignant neoplasm of anus, unspecified: Secondary | ICD-10-CM | POA: Diagnosis not present

## 2015-05-14 DIAGNOSIS — J449 Chronic obstructive pulmonary disease, unspecified: Secondary | ICD-10-CM | POA: Insufficient documentation

## 2015-05-14 DIAGNOSIS — F209 Schizophrenia, unspecified: Secondary | ICD-10-CM | POA: Insufficient documentation

## 2015-05-14 DIAGNOSIS — I10 Essential (primary) hypertension: Secondary | ICD-10-CM | POA: Insufficient documentation

## 2015-05-14 DIAGNOSIS — E039 Hypothyroidism, unspecified: Secondary | ICD-10-CM | POA: Insufficient documentation

## 2015-05-14 HISTORY — PX: PERIPHERAL VASCULAR CATHETERIZATION: SHX172C

## 2015-05-14 SURGERY — PORTA CATH INSERTION
Anesthesia: Moderate Sedation

## 2015-05-14 MED ORDER — SODIUM CHLORIDE 0.9 % IV SOLN
INTRAVENOUS | Status: DC
  Administered 2015-05-14: 10:00:00 via INTRAVENOUS

## 2015-05-14 MED ORDER — FENTANYL CITRATE (PF) 100 MCG/2ML IJ SOLN
INTRAMUSCULAR | Status: AC
  Filled 2015-05-14: qty 2

## 2015-05-14 MED ORDER — HEPARIN (PORCINE) IN NACL 2-0.9 UNIT/ML-% IJ SOLN
INTRAMUSCULAR | Status: AC
  Filled 2015-05-14: qty 500

## 2015-05-14 MED ORDER — SODIUM CHLORIDE 0.9 % IR SOLN
Freq: Once | Status: DC
  Filled 2015-05-14 (×2): qty 2

## 2015-05-14 MED ORDER — MIDAZOLAM HCL 5 MG/5ML IJ SOLN
INTRAMUSCULAR | Status: AC
  Filled 2015-05-14: qty 5

## 2015-05-14 MED ORDER — DEXTROSE 5 % IV SOLN
1.5000 g | INTRAVENOUS | Status: AC
  Administered 2015-05-14: 1.5 g via INTRAVENOUS

## 2015-05-14 MED ORDER — ONDANSETRON HCL 4 MG/2ML IJ SOLN: 4.0000 mg | Freq: Four times a day (QID) | INTRAMUSCULAR | Status: DC | PRN

## 2015-05-14 MED ORDER — MIDAZOLAM HCL 2 MG/2ML IJ SOLN
INTRAMUSCULAR | Status: DC | PRN
  Administered 2015-05-14: 2 mg via INTRAVENOUS
  Administered 2015-05-14: 1 mg via INTRAVENOUS

## 2015-05-14 MED ORDER — HYDROMORPHONE HCL 1 MG/ML IJ SOLN
1.0000 mg | Freq: Once | INTRAMUSCULAR | Status: AC
  Administered 2015-05-14: 1 mg via INTRAVENOUS

## 2015-05-14 MED ORDER — FENTANYL CITRATE (PF) 100 MCG/2ML IJ SOLN
INTRAMUSCULAR | Status: DC | PRN
  Administered 2015-05-14 (×2): 50 ug via INTRAVENOUS

## 2015-05-14 MED ORDER — LIDOCAINE-EPINEPHRINE (PF) 1 %-1:200000 IJ SOLN
INTRAMUSCULAR | Status: DC | PRN
  Administered 2015-05-14: 20 mL via INTRADERMAL

## 2015-05-14 MED ORDER — LIDOCAINE-EPINEPHRINE (PF) 1 %-1:200000 IJ SOLN
INTRAMUSCULAR | Status: AC
  Filled 2015-05-14: qty 30

## 2015-05-14 MED ORDER — HYDROMORPHONE HCL 1 MG/ML IJ SOLN
INTRAMUSCULAR | Status: AC
  Filled 2015-05-14: qty 1

## 2015-05-14 SURGICAL SUPPLY — 10 items
BAG DECANTER STRL (MISCELLANEOUS) ×3 IMPLANT
KIT PORT POWER 8FR ISP CVUE (Catheter) ×3 IMPLANT
PACK ANGIOGRAPHY (CUSTOM PROCEDURE TRAY) ×3 IMPLANT
PAD GROUND ADULT SPLIT (MISCELLANEOUS) ×3 IMPLANT
PENCIL ELECTRO HAND CTR (MISCELLANEOUS) ×3 IMPLANT
PREP CHG 10.5 TEAL (MISCELLANEOUS) ×3 IMPLANT
SUT MNCRL AB 4-0 PS2 18 (SUTURE) ×3 IMPLANT
SUT PROLENE 0 CT 1 30 (SUTURE) ×3 IMPLANT
SUTURE VIC 3-0 (SUTURE) ×3 IMPLANT
TOWEL OR 17X26 4PK STRL BLUE (TOWEL DISPOSABLE) ×3 IMPLANT

## 2015-05-14 NOTE — H&P (Signed)
  [facility] History & Physical Update  The patient was interviewed and re-examined.  The patient's previous History and Physical has been reviewed and is unchanged.  There is no change in the plan of care. We plan to proceed with the scheduled procedure.  DEW,JASON, MD  05/14/2015, 9:24 AM

## 2015-05-14 NOTE — Patient Outreach (Addendum)
Transition of care call (week 1, discharged 4/30).    Spoke with pt, HIPPA verified.  Pt reports since discharge, getting stronger, using walker to do exercises.  Pt reports appetite not good, was getting Ensure while in rehab, none since  Discharge, cannot afford.  RN CM suggested use of Carnation Instant breakfast, small frequent meals/snacks.    Pt reports on port a cath done today, area sore, was told it would be.  Pt reports to f/u with oncologist tomorrow, to get something for pain (port a cath site, area around lungs, back).  Pt reports to f/u at pain clinic 5/24. Pt reports has Trazodone to use if needed for sleep.  Pt reports was receiving Home health services prior to hospitalization, has not heard from them post discharge, has number to call if needed. Pt reports does not have a f/u appointment with Primary Care MD, plan to call and schedule.   Pt reports on continuous O2 2 LNC.    Pt reports no issues with transportation, has best friend or [employer] to take her.  Discussed with pt plan to f/u with weekly calls, do a home visit as part of transition of care.  As discussed, home visit scheduled for 5/15.    Plan to inform [name] NP of [facility] involvement   [name].   [facility]  [phone]

## 2015-05-14 NOTE — Discharge Instructions (Signed)
, Care After °Refer to this sheet in the next few weeks. These instructions provide you with information on caring for yourself after your procedure. Your caregiver may also give you more specific instructions. Your treatment has been planned according to current medical practices, but problems sometimes occur. Call your caregiver if you have any problems or questions after your procedure.  °HOME CARE INSTRUCTIONS °· Rest at home the day of the procedure. You will likely be able to return to normal activities the following day. °· Follow your caregiver's specific instructions for the type of device that you have. °· Only take over-the-counter or prescription medicines as directed by your caregiver. °· Keep the insertion site of the catheter clean and dry at all times. °¨ Change the bandages (dressings) over the catheter site as directed by your caregiver. °¨ Wash the area around the catheter site during each dressing change. Sponge bathe the area using a germ-killing (antiseptic) solution as directed by your caregiver. °¨ Look for redness or swelling at the insertion site during each dressing change. °· Apply an antibiotic ointment as directed by your caregiver. °· Flush your catheter as directed to keep it from becoming clogged. °· Always wash your hands thoroughly before changing dressings or flushing the catheter. °· Do not let air enter the catheter. °¨ Never open the cap at the catheter tip. °¨ Always make sure there is no air in the syringe or in the tubing for infusions.    °· Do not lift anything heavy. °· Do not drive until your caregiver approves. °· Do not shower or bathe until your caregiver approves. When you shower or bathe, place a piece of plastic wrap over the catheter site. Do not allow the catheter site or the dressing to get wet. If taking a bath, do not allow the catheter to get submerged in the water. °If the catheter was inserted through an arm vein:  °· Avoid wearing tight clothes or jewelry  on the arm that has the catheter.   °· Do not sleep with your head on the arm that has the catheter.   °· Do not allow use of a blood pressure cuff on the arm that has the catheter.   °· Do not let anyone draw blood from the arm that has the catheter, except through the catheter itself. °SEEK MEDICAL CARE IF: °· You have bleeding at the insertion site of the catheter.   °· You feel weak or nauseous.   °· Your catheter is not working properly.   °· You have redness, pain, swelling, and warmth at the insertion site.   °· You notice fluid draining from the insertion site.   °SEEK IMMEDIATE MEDICAL CARE IF: °· Your catheter breaks or has a hole in it.   °· Your catheter comes loose or gets pulled completely out. If this happens, hold firm pressure over the area with your hand or a clean cloth.   °· You have a fever. °· You have chills.   °· Your catheter becomes totally blocked.   °· You have swelling in your arm, shoulder, neck, or face.   °· You have bleeding from the insertion site that does not stop.   °· You develop chest pain or have trouble breathing.   °· You feel dizzy or faint.   °MAKE SURE YOU: °· Understand these instructions. °· Will watch your condition. °· Will get help right away if you are not doing well or get worse. °  °This information is not intended to replace advice given to you by your health care provider. Make sure you discuss any questions you   have with your health care provider. °  °Document Released: 12/10/2011 Document Revised: 08/25/2012 Document Reviewed: 12/10/2011 °Elsevier Interactive Patient Education ©2016 [employer] ° °

## 2015-05-14 NOTE — Op Note (Signed)
      [facility]       Operative Note  Date: 05/14/2015  Preoperative diagnosis:  1. Lung cancer, anal cancer  Postoperative diagnosis:  Same as above  Procedures: #1. Ultrasound guidance for vascular access to the right internal jugular vein. #2. Fluoroscopic guidance for placement of catheter. #3. Placement of CT compatible Port-A-Cath, right internal jugular vein.  Surgeon: Leotis Pain, MD.   Anesthesia: Local with moderate conscious sedation for approximately 25  minutes using 3 mg of Versed and 100 mcg of Fentanyl  Fluoroscopy time: less than 1 minute  Contrast used: 0  Estimated blood loss: Minimal  Indication for the procedure:  The patient is a 60 y.o.female with lung cancer and a history of anal cancer.  The patient needs a Port-A-Cath for durable venous access, chemotherapy, lab draws, and CT scans. We are asked to place this. Risks and benefits were discussed and informed consent was obtained.  Description of procedure: The patient was brought to the vascular and interventional radiology suite.  Moderate conscious sedation was administered throughout the procedure during a face to face encounter with the patient with my supervision of the RN administering medicines and monitoring the patient's vital signs, pulse oximetry, telemetry and mental status throughout from the start of the procedure until the patient was taken to the recovery room. The right neck chest and shoulder were sterilely prepped and draped, and a sterile surgical field was created. Ultrasound was used to help visualize a patent right internal jugular vein. This was then accessed under direct ultrasound guidance without difficulty with the Seldinger needle and a permanent image was recorded. A J-wire was placed. After skin nick and dilatation, the peel-away sheath was then placed over the wire. I then anesthetized an area under the clavicle approximately 1-2 fingerbreadths. A transverse  incision was created and an inferior pocket was created with electrocautery and blunt dissection. The port was then brought onto the field, placed into the pocket and secured to the chest wall with 2 Prolene sutures. The catheter was connected to the port and tunneled from the subclavicular incision to the access site. Fluoroscopic guidance was then used to cut the catheter to an appropriate length. The catheter was then placed through the peel-away sheath and the peel-away sheath was removed. The catheter tip was parked in excellent location under fluorocoscopic guidance in the mid superior vena cava. The pocket was then irrigated with antibiotic impregnated saline and the wound was closed with a running 3-0 Vicryl and a 4-0 Monocryl. The access incision was closed with a single 4-0 Monocryl. The Huber needle was used to withdraw blood and flush the port with heparinized saline. Dermabond was then placed as a dressing. The patient tolerated the procedure well and was taken to the recovery room in stable condition.   Nasia Cannan 05/14/2015 11:09 AM

## 2015-05-15 ENCOUNTER — Inpatient Hospital Stay: Payer: Commercial Managed Care - HMO

## 2015-05-15 ENCOUNTER — Encounter: Payer: Self-pay | Admitting: Vascular Surgery

## 2015-05-15 ENCOUNTER — Encounter: Payer: Self-pay | Admitting: Hematology and Oncology

## 2015-05-15 ENCOUNTER — Inpatient Hospital Stay (HOSPITAL_BASED_OUTPATIENT_CLINIC_OR_DEPARTMENT_OTHER): Payer: Commercial Managed Care - HMO | Admitting: Hematology and Oncology

## 2015-05-15 ENCOUNTER — Inpatient Hospital Stay: Payer: Commercial Managed Care - HMO | Attending: Hematology and Oncology

## 2015-05-15 ENCOUNTER — Encounter: Payer: Self-pay | Admitting: *Deleted

## 2015-05-15 ENCOUNTER — Other Ambulatory Visit: Payer: Self-pay | Admitting: Hematology and Oncology

## 2015-05-15 VITALS — BP 138/98 | HR 91 | Temp 95.7°F | Resp 19 | Ht 66.5 in | Wt 190.8 lb

## 2015-05-15 DIAGNOSIS — F319 Bipolar disorder, unspecified: Secondary | ICD-10-CM | POA: Insufficient documentation

## 2015-05-15 DIAGNOSIS — J449 Chronic obstructive pulmonary disease, unspecified: Secondary | ICD-10-CM | POA: Diagnosis not present

## 2015-05-15 DIAGNOSIS — M4800 Spinal stenosis, site unspecified: Secondary | ICD-10-CM

## 2015-05-15 DIAGNOSIS — C210 Malignant neoplasm of anus, unspecified: Secondary | ICD-10-CM

## 2015-05-15 DIAGNOSIS — H903 Sensorineural hearing loss, bilateral: Secondary | ICD-10-CM | POA: Insufficient documentation

## 2015-05-15 DIAGNOSIS — E538 Deficiency of other specified B group vitamins: Secondary | ICD-10-CM | POA: Diagnosis not present

## 2015-05-15 DIAGNOSIS — R0602 Shortness of breath: Secondary | ICD-10-CM | POA: Insufficient documentation

## 2015-05-15 DIAGNOSIS — J45909 Unspecified asthma, uncomplicated: Secondary | ICD-10-CM

## 2015-05-15 DIAGNOSIS — Z809 Family history of malignant neoplasm, unspecified: Secondary | ICD-10-CM | POA: Insufficient documentation

## 2015-05-15 DIAGNOSIS — R251 Tremor, unspecified: Secondary | ICD-10-CM | POA: Insufficient documentation

## 2015-05-15 DIAGNOSIS — K219 Gastro-esophageal reflux disease without esophagitis: Secondary | ICD-10-CM | POA: Insufficient documentation

## 2015-05-15 DIAGNOSIS — F418 Other specified anxiety disorders: Secondary | ICD-10-CM

## 2015-05-15 DIAGNOSIS — F4310 Post-traumatic stress disorder, unspecified: Secondary | ICD-10-CM | POA: Insufficient documentation

## 2015-05-15 DIAGNOSIS — E041 Nontoxic single thyroid nodule: Secondary | ICD-10-CM

## 2015-05-15 DIAGNOSIS — Z7982 Long term (current) use of aspirin: Secondary | ICD-10-CM

## 2015-05-15 DIAGNOSIS — I2510 Atherosclerotic heart disease of native coronary artery without angina pectoris: Secondary | ICD-10-CM

## 2015-05-15 DIAGNOSIS — R2243 Localized swelling, mass and lump, lower limb, bilateral: Secondary | ICD-10-CM

## 2015-05-15 DIAGNOSIS — R609 Edema, unspecified: Secondary | ICD-10-CM

## 2015-05-15 DIAGNOSIS — Z933 Colostomy status: Secondary | ICD-10-CM | POA: Insufficient documentation

## 2015-05-15 DIAGNOSIS — M797 Fibromyalgia: Secondary | ICD-10-CM | POA: Insufficient documentation

## 2015-05-15 DIAGNOSIS — K760 Fatty (change of) liver, not elsewhere classified: Secondary | ICD-10-CM | POA: Insufficient documentation

## 2015-05-15 DIAGNOSIS — G629 Polyneuropathy, unspecified: Secondary | ICD-10-CM

## 2015-05-15 DIAGNOSIS — E039 Hypothyroidism, unspecified: Secondary | ICD-10-CM | POA: Diagnosis not present

## 2015-05-15 DIAGNOSIS — F17210 Nicotine dependence, cigarettes, uncomplicated: Secondary | ICD-10-CM | POA: Insufficient documentation

## 2015-05-15 DIAGNOSIS — Z9221 Personal history of antineoplastic chemotherapy: Secondary | ICD-10-CM | POA: Diagnosis not present

## 2015-05-15 DIAGNOSIS — G4730 Sleep apnea, unspecified: Secondary | ICD-10-CM | POA: Insufficient documentation

## 2015-05-15 DIAGNOSIS — G8929 Other chronic pain: Secondary | ICD-10-CM

## 2015-05-15 DIAGNOSIS — D649 Anemia, unspecified: Secondary | ICD-10-CM | POA: Insufficient documentation

## 2015-05-15 DIAGNOSIS — F209 Schizophrenia, unspecified: Secondary | ICD-10-CM | POA: Insufficient documentation

## 2015-05-15 DIAGNOSIS — Z7901 Long term (current) use of anticoagulants: Secondary | ICD-10-CM | POA: Insufficient documentation

## 2015-05-15 DIAGNOSIS — R591 Generalized enlarged lymph nodes: Secondary | ICD-10-CM

## 2015-05-15 DIAGNOSIS — Z79899 Other long term (current) drug therapy: Secondary | ICD-10-CM | POA: Insufficient documentation

## 2015-05-15 DIAGNOSIS — E46 Unspecified protein-calorie malnutrition: Secondary | ICD-10-CM

## 2015-05-15 DIAGNOSIS — R590 Localized enlarged lymph nodes: Secondary | ICD-10-CM | POA: Insufficient documentation

## 2015-05-15 DIAGNOSIS — Z8701 Personal history of pneumonia (recurrent): Secondary | ICD-10-CM | POA: Insufficient documentation

## 2015-05-15 DIAGNOSIS — Z86718 Personal history of other venous thrombosis and embolism: Secondary | ICD-10-CM

## 2015-05-15 DIAGNOSIS — Z923 Personal history of irradiation: Secondary | ICD-10-CM | POA: Diagnosis not present

## 2015-05-15 DIAGNOSIS — M5136 Other intervertebral disc degeneration, lumbar region: Secondary | ICD-10-CM | POA: Insufficient documentation

## 2015-05-15 DIAGNOSIS — I714 Abdominal aortic aneurysm, without rupture: Secondary | ICD-10-CM

## 2015-05-15 LAB — CBC WITH DIFFERENTIAL/PLATELET
Basophils Absolute: 0.1 10*3/uL (ref 0–0.1)
Basophils Relative: 1 %
Eosinophils Absolute: 1.3 10*3/uL — ABNORMAL HIGH (ref 0–0.7)
Eosinophils Relative: 12 %
HCT: 26.2 % — ABNORMAL LOW (ref 35.0–47.0)
Hemoglobin: 8.5 g/dL — ABNORMAL LOW (ref 12.0–16.0)
Lymphocytes Relative: 13 %
Lymphs Abs: 1.4 10*3/uL (ref 1.0–3.6)
MCH: 28.2 pg (ref 26.0–34.0)
MCHC: 32.6 g/dL (ref 32.0–36.0)
MCV: 86.5 fL (ref 80.0–100.0)
Monocytes Absolute: 0.6 10*3/uL (ref 0.2–0.9)
Monocytes Relative: 6 %
Neutro Abs: 7.1 10*3/uL — ABNORMAL HIGH (ref 1.4–6.5)
Neutrophils Relative %: 68 %
Platelets: 375 10*3/uL (ref 150–440)
RBC: 3.02 MIL/uL — ABNORMAL LOW (ref 3.80–5.20)
RDW: 18.3 % — ABNORMAL HIGH (ref 11.5–14.5)
WBC: 10.5 10*3/uL (ref 3.6–11.0)

## 2015-05-15 LAB — COMPREHENSIVE METABOLIC PANEL
ALT: 14 U/L (ref 14–54)
AST: 48 U/L — ABNORMAL HIGH (ref 15–41)
Albumin: 2.1 g/dL — ABNORMAL LOW (ref 3.5–5.0)
Alkaline Phosphatase: 110 U/L (ref 38–126)
Anion gap: 6 (ref 5–15)
BUN: 10 mg/dL (ref 6–20)
CO2: 28 mmol/L (ref 22–32)
Calcium: 9.7 mg/dL (ref 8.9–10.3)
Chloride: 102 mmol/L (ref 101–111)
Creatinine, Ser: 0.72 mg/dL (ref 0.44–1.00)
GFR calc Af Amer: >60 mL/min (ref >60)
GFR calc non Af Amer: >60 mL/min (ref >60)
Glucose, Bld: 108 mg/dL — ABNORMAL HIGH (ref 65–99)
Potassium: 3.7 mmol/L (ref 3.5–5.1)
Sodium: 136 mmol/L (ref 135–145)
Total Bilirubin: 0.5 mg/dL (ref 0.3–1.2)
Total Protein: 6.1 g/dL — ABNORMAL LOW (ref 6.5–8.1)

## 2015-05-15 LAB — MAGNESIUM: Magnesium: 1.8 mg/dL (ref 1.7–2.4)

## 2015-05-15 MED ORDER — APIXABAN 2.5 MG PO TABS: 2.5000 mg | ORAL_TABLET | Freq: Two times a day (BID) | ORAL | Status: AC

## 2015-05-15 MED ORDER — OXYCODONE-ACETAMINOPHEN 10-325 MG PO TABS: 1.0000 | ORAL_TABLET | Freq: Four times a day (QID) | ORAL | Status: DC | PRN

## 2015-05-15 NOTE — Progress Notes (Signed)
Pt complaining of pain at port insertion site.  Had fall about a month ago at home into bathtub yellow fall precaution bracelet given pt had good balance for weight.  Pt verbalizes appetite is not good and just doesn't want anything.

## 2015-05-15 NOTE — Progress Notes (Signed)
[facility] day: 05/15/2015   Chief Complaint: [name] is a 60 y.o. 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  HPI:  The patient was last seen in the medical oncology clinic on 05/01/2015.  At that time,  she was extremely debilitated.  Nutritional status was poor (albumen 1.7).  She had anemia likely secondary to recent GI bleed and chronic disease.  She had 3+ bilateral lower extremity edema (left > right).  Bilateral lower extremity duplex on 05/01/2015 revealed no evidence of DVT.    PET scan on 05/03/2015 revealed no evidence of hypermetabolic local tumor recurrence in the anus.  There was new hypermetabolic right paratracheal and subcarinal mediastinal lymphadenopathy, suspicious for nodal metastases.  There was extensive hypermetabolism throughout the right pleural space.  There was a new mildly metabolic 0.7 cm nodule associated with the minor fissure.  There was a small loculated right basilar pleural effusion.  There was no definite hypermetabolic metastatic disease in the abdomen or pelvis.  There was focal hypermetabolism in the left abdomen at the site of nondilated proximal small bowel loop with no associated mass or small bowel wall thickening, favor physiologic activity.  There was a stable 3.9 cm infrarenal abdominal aortic aneurysm.  Additional findings included coronary atherosclerosis, mild diffuse hepatic steatosis, small hiatal hernia and evidence of third-spacing including anasarca, trace left pleural effusion and small volume ascites.  At last visit, she was living at [facility] rehabilitation.  She was discharged on 05/10/2015.  She has subsequently moved in with her best friend and her family .  She states that she has made good improvement.  She has lost a significant amount of fluid weight.  She is able to take take of her activities of  daily living.  She takes a sponge bath.  She is able to get dressed and use the restroom independently.  Her friend cooks and cleans.    She underwent port-a-cath placement on 05/14/2015 by Dr. Leotis Pain.  She underwent audiogram.  Symptomatically, she notes pain associated with her port.  She has no pain medications.  She also notes back pain.  Ostomy is working well.  She has some abdominal discomfort.  She drinks Ensure twice a day.  She states that's she rests 50% of the day (watches TV).   Past Medical History  Diagnosis Date  . Schizophrenia ([place])   . Asthma   . GERD (gastroesophageal reflux disease)   . Anxiety   . Depression   . Bipolar disorder ([place])   . COPD (chronic obstructive pulmonary disease) ([place])   . Occasional tremors     right hand  . PTSD (post-traumatic stress disorder)   . Shortness of breath dyspnea   . Fibromyalgia   . DVT (deep venous thrombosis) ([place]) 2011    RUE  . Thyroid nodule   . DDD (degenerative disc disease), lumbar   . Spinal stenosis   . Peripheral neuropathy ([place])   . Rotator cuff tear     right  . Pneumonia 2011  . Hypothyroidism     no meds currently  . Anemia     during pregnancy only  . Hypertension     Off meds x 15 years-well controlled now per pt  . Severe obstructive sleep apnea 06/27/2014  . DVT of upper extremity (deep vein thrombosis) ([place]) 10/14/2014  . Squamous cell cancer, anus (HCC)   .  Lung cancer ([place])   . Hemorrhoid 06/27/2014  . SBO (small bowel obstruction) [facility])     Past Surgical History  Procedure Laterality Date  . Foot surgery Right   . Tubal ligation    . Eye surgery Bilateral   . Mouth surgery  2002  . Rectal biopsy N/A 05/08/2014    Procedure: BIOPSY RECTAL;  Surgeon: Marlyce Huge, MD;  Location: [facility] ORS;  Service: General;  Laterality: N/A;  . Evaluation under anesthesia with hemorrhoidectomy N/A 05/08/2014    Procedure: EXAM UNDER ANESTHESIA WITH HEMORRHOIDECTOMY;  Surgeon: Marlyce Huge,  MD;  Location: [facility] ORS;  Service: General;  Laterality: N/A;  . Portacath placement N/A 05/20/2014    Procedure: INSERTION PORT-A-CATH;  Surgeon: Florene Glen, MD;  Location: [facility] ORS;  Service: General;  Laterality: N/A;  . Laparoscopic diverted colostomy N/A 05/26/2014    Procedure: LAPAROSCOPIC DIVERTED COLOSTOMY;  Surgeon: Marlyce Huge, MD;  Location: [facility] ORS;  Service: General;  Laterality: N/A;  . Peripheral vascular catheterization Left 10/16/2014    Procedure: Upper Extremity Venography with thrombectomy, port removal;  Surgeon: Algernon Huxley, MD;  Location: [place] CV LAB;  Service: Cardiovascular;  Laterality: Left;  . Peripheral vascular catheterization  10/16/2014    Procedure: Upper Extremity Intervention;  Surgeon: Algernon Huxley, MD;  Location: [place] CV LAB;  Service: Cardiovascular;;  . Video assisted thoracoscopy (vats)/thorocotomy Right 04/10/2015    Procedure: PRE-OP BRONCHOSCOPY, VIDEO ASSISTED THORACOSCOPY RIGHT WITH PLEURAL BIOPSIES, & PLEURAL DESIS;  Surgeon: Nestor Lewandowsky, MD;  Location: [facility] ORS;  Service: Thoracic;  Laterality: Right;  . Esophagogastroduodenoscopy N/A 04/19/2015    Procedure: ESOPHAGOGASTRODUODENOSCOPY (EGD);  Surgeon: Hulen Luster, MD;  Location: [facility] ENDOSCOPY;  Service: Endoscopy;  Laterality: N/A;  . Colonoscopy N/A 04/19/2015    Procedure: COLONOSCOPY;  Surgeon: Hulen Luster, MD;  Location: [facility] ENDOSCOPY;  Service: Endoscopy;  Laterality: N/A;  . Peripheral vascular catheterization N/A 05/14/2015    Procedure: Porta Cath Insertion;  Surgeon: Algernon Huxley, MD;  Location: [place] [facility] LAB;  Service: Cardiovascular;  Laterality: N/A;    Family History  Problem Relation Age of Onset  . Cancer Maternal Aunt   . Cancer Paternal Uncle   . Diabetes Mother   . Thyroid disease Mother   . Kidney failure Mother   . Hypertension Mother   . Depression Mother   . Mental illness Son   . Aneurysm Maternal Grandmother   . Thyroid disease  Maternal Grandmother   . Stroke Maternal Grandfather   . Hypertension Maternal Grandfather   . Diabetes Maternal Grandfather   . Heart disease Maternal Grandfather     MI  . Nephrolithiasis Daughter     Social History:  reports that she quit smoking about 7 weeks ago. Her smoking use included Cigarettes. She started smoking about 44 years ago. She has a 4.4 pack-year smoking history. She has never used smokeless tobacco. She reports that she does not drink alcohol or use illicit drugs.  She was discharged from [facility] on 05/10/2015.  She is living with her best friend and her family.  The patient is alone today.  Allergies:  Allergies  Allergen Reactions  . Fish Allergy Anaphylaxis    Patient allergic to perch only. She can eat other fish.  . Sulfa Antibiotics Hives    Current Medications: Current Outpatient Prescriptions  Medication Sig Dispense Refill  . albuterol (PROVENTIL) (2.5 MG/3ML) 0.083% nebulizer solution Take 3 mLs (2.5 mg total) by nebulization every 4 (  four) hours. (Patient taking differently: Take 2.5 mg by nebulization every 4 (four) hours as needed for wheezing or shortness of breath. ) 360 vial 12  . ARIPiprazole (ABILIFY) 2 MG tablet Take 1 tablet (2 mg total) by mouth daily. 30 tablet 4  . aspirin EC 81 MG EC tablet Take 1 tablet (81 mg total) by mouth daily. 30 tablet 1  . budesonide-formoterol (SYMBICORT) 160-4.5 MCG/ACT inhaler Inhale 2 puffs into the lungs 2 (two) times daily. 1 Inhaler 12  . cholecalciferol 1000 units tablet Take 1 tablet (1,000 Units total) by mouth daily. 30 tablet 0  . clonazePAM (KLONOPIN) 1 MG tablet Take 1 tablet (1 mg total) by mouth 3 (three) times daily as needed for anxiety. 90 tablet 4  . DULoxetine (CYMBALTA) 60 MG capsule Take 60 mg by mouth every morning.     Marland Kitchen ENSURE (ENSURE) Take 237 mLs by mouth 2 (two) times daily between meals. Reported on 05/14/2015    . escitalopram (LEXAPRO) 20 MG tablet Take 1 tablet (20 mg total) by mouth  daily. 30 tablet 4  . ferrous sulfate 325 (65 FE) MG tablet Take 1 tablet (325 mg total) by mouth 2 (two) times daily with a meal. 60 tablet 0  . folic acid (FOLVITE) 1 MG tablet Take 1 tablet (1 mg total) by mouth daily. 30 tablet 3  . Magnesium 250 MG TABS Take 250 mg by mouth daily. Reported on 05/15/2015    . nicotine (NICODERM CQ - DOSED IN MG/24 HOURS) 21 mg/24hr patch Place 1 patch (21 mg total) onto the skin daily. 28 patch 0  . ondansetron (ZOFRAN) 4 MG tablet Take 1 tablet (4 mg total) by mouth every 6 (six) hours as needed for nausea. 20 tablet 0  . oxyCODONE-acetaminophen (PERCOCET) 10-325 MG tablet Take 1 tablet by mouth every 6 (six) hours as needed. Reported on 04/30/2015 60 tablet 0  . pantoprazole (PROTONIX) 40 MG tablet Take 40 mg by mouth 2 (two) times daily.     . polyethylene glycol (MIRALAX / GLYCOLAX) packet Take 17 g by mouth daily. 14 each 0  . senna-docusate (SENOKOT-S) 8.6-50 MG tablet Take 1 tablet by mouth at bedtime as needed for mild constipation. 30 tablet 5  . tiotropium (SPIRIVA) 18 MCG inhalation capsule Place 1 capsule (18 mcg total) into inhaler and inhale daily. 30 capsule 12  . VENTOLIN HFA 108 (90 Base) MCG/ACT inhaler Reported on 05/14/2015    . vitamin B-12 (CYANOCOBALAMIN) 1000 MCG tablet Take 1,000 mcg by mouth daily. Reported on 05/14/2015    . zolpidem (AMBIEN) 10 MG tablet Take 1 tablet (10 mg total) by mouth at bedtime as needed for sleep. 30 tablet 4  . baclofen (LIORESAL) 10 MG tablet Take 10 mg by mouth 3 (three) times daily. Reported on 05/15/2015    . LYRICA 200 MG capsule Reported on 05/15/2015    . nystatin (MYCOSTATIN) 100000 UNIT/ML suspension Take 5 mLs (500,000 Units total) by mouth 4 (four) times daily. (Patient not taking: Reported on 05/14/2015) 60 mL 2  . protein supplement (RESOURCE BENEPROTEIN) POWD Take 1 scoop by mouth 2 (two) times daily between meals.    . [DISCONTINUED] apixaban (ELIQUIS) 5 MG TABS tablet Take 1 tablet (5 mg total) by mouth 2  (two) times daily. 60 tablet 2   No current facility-administered medications for this visit.   Facility-Administered Medications Ordered in Other Visits  Medication Dose Route Frequency Provider Last Rate Last Dose  . heparin lock flush  100 unit/mL  500 Units Intravenous Once Lequita Asal, MD      . sodium chloride 0.9 % injection 10 mL  10 mL Intravenous PRN Lequita Asal, MD        Review of Systems:  GENERAL:  Feeling better.  No fevers or sweats.  Weight down 31 pounds (fluid weight loss). PERFORMANCE STATUS (ECOG):  3 HEENT:  No visual changes, runny nose, mouth sores or sore throat. Lungs:  No shortness of breath improved on 2 liters/min oxygen.  No cough.  No hemoptysis. Cardiac:  No chest pain, palpitations, orthopnea, or PND. GI:  Colostomy.  Some abdominal pain.  Appetite, improved.  No nausea, constipation, melena or hematochezia. GU:  No urgency, frequency, dysuria, or hematuria. Musculoskeletal:  Back pain with 3 compression fractures (old).  No joint pain.  No muscle tenderness. Extremities:  Swelling in lower legs, improved. Skin:  No rashes or ulcers. Neuro:  No headache, numbness or weakness, balance or coordination issues. Endocrine:  No diabetes, thyroid issues, hot flashes or night sweats. Psych:  No mood changes or depression.  Pain:  Back pain 8 of 10.  Abdominal discomfort. Review of systems:  All other systems reviewed and found to be negative.   Physical Exam: Blood pressure 138/98, pulse 91, temperature 95.7 F (35.4 C), temperature source Tympanic, resp. rate 19, height 5' 6.5" (1.689 m), weight 190 lb 12.9 oz (86.55 kg), SpO2 97 %.  GENERAL:  Chronically ill appearing woman sitting comfortably in a wheelchair in the exam room in no acute distress.  MENTAL STATUS:  Alert and oriented to person, place and time. HEAD:  Shoulder length gray hair.  Normocephalic, atraumatic, face symmetric, no Cushingoid features. EYES:  Blue eyes.  Pupils equal  round and reactive to light and accomodation.  No conjunctivitis or scleral icterus. ENT:  [place] in place.  Oropharynx clear without lesion.  Tongue normal. Mucous membranes moist.  RESPIRATORY:  Clear to auscultation without rales, wheezes or rhonchi. CARDIOVASCULAR:  Regular rate and rhythm without murmur, rub or gallop. CHEST:  Right sided port tender to touch.  No erythema. ABDOMEN:  Ostomy.  Stool liquid brown (no blood).  Soft, non-tender with active bowel sounds and no hepatosplenomegaly.  No masses. LYMPH NODES:  No palpable cervical, supraclavicular, axillary or inguinal adenopathy. SKIN:  Upper extremity bruises.  No other rashes, ulcers or lesions. EXTREMITIES: Bilateral 1+ lower extremity edema (improved).  No skin discoloration or tenderness.  No palpable cords. NEUROLOGICAL: Unremarkable. PSYCH:  Appropriate.   Appointment on 05/15/2015  Component Date Value Ref Range Status  . WBC 05/15/2015 10.5  3.6 - 11.0 K/uL Final  . RBC 05/15/2015 3.02* 3.80 - 5.20 MIL/uL Final  . Hemoglobin 05/15/2015 8.5* 12.0 - 16.0 g/dL Final  . HCT 05/15/2015 26.2* 35.0 - 47.0 % Final  . MCV 05/15/2015 86.5  80.0 - 100.0 fL Final  . MCH 05/15/2015 28.2  26.0 - 34.0 pg Final  . MCHC 05/15/2015 32.6  32.0 - 36.0 g/dL Final  . RDW 05/15/2015 18.3* 11.5 - 14.5 % Final  . Platelets 05/15/2015 375  150 - 440 K/uL Final  . Neutrophils Relative % 05/15/2015 68   Final  . Neutro Abs 05/15/2015 7.1* 1.4 - 6.5 K/uL Final  . Lymphocytes Relative 05/15/2015 13   Final  . Lymphs Abs 05/15/2015 1.4  1.0 - 3.6 K/uL Final  . Monocytes Relative 05/15/2015 6   Final  . Monocytes Absolute 05/15/2015 0.6  0.2 - 0.9 K/uL Final  .  Eosinophils Relative 05/15/2015 12   Final  . Eosinophils Absolute 05/15/2015 1.3* 0 - 0.7 K/uL Final  . Basophils Relative 05/15/2015 1   Final  . Basophils Absolute 05/15/2015 0.1  0 - 0.1 K/uL Final  . Sodium 05/15/2015 136  135 - 145 mmol/L Final  . Potassium 05/15/2015 3.7  3.5 - 5.1  mmol/L Final  . Chloride 05/15/2015 102  101 - 111 mmol/L Final  . CO2 05/15/2015 28  22 - 32 mmol/L Final  . Glucose, Bld 05/15/2015 108* 65 - 99 mg/dL Final  . BUN 05/15/2015 10  6 - 20 mg/dL Final  . Creatinine, Ser 05/15/2015 0.72  0.44 - 1.00 mg/dL Final  . Calcium 05/15/2015 9.7  8.9 - 10.3 mg/dL Final  . Total Protein 05/15/2015 6.1* 6.5 - 8.1 g/dL Final  . Albumin 05/15/2015 2.1* 3.5 - 5.0 g/dL Final  . AST 05/15/2015 48* 15 - 41 U/L Final  . ALT 05/15/2015 14  14 - 54 U/L Final  . Alkaline Phosphatase 05/15/2015 110  38 - 126 U/L Final  . Total Bilirubin 05/15/2015 0.5  0.3 - 1.2 mg/dL Final  . GFR calc non Af Amer 05/15/2015 >60  >60 mL/min Final  . GFR calc Af Amer 05/15/2015 >60  >60 mL/min Final   Comment: (NOTE) The eGFR has been calculated using the CKD EPI equation. This calculation has not been validated in all clinical situations. eGFR's persistently <60 mL/min signify possible Chronic Kidney Disease.   . Anion gap 05/15/2015 6  5 - 15 Final  . Magnesium 05/15/2015 1.8  1.7 - 2.4 mg/dL Final    Assessment:  [name] is a 60 y.o. female with stage IV anal cancer.  She presented in 05/2014 with a 6 month history of an 80 pound weight and a 2 month history of fecal incontinence and progressive pain.  Chest, abdomen, and pelvic CT scan on 05/19/2014 revealed no evidence of metastatic disease.  There was a 4 mm RUL pulmonary nodule.  There were borderline (13 mm) enlarged iliac nodes.  There was a 4.7 x 4.1 x 6.0 cm lower rectal and anal mass consistent with anal cancer.  She underwent diverting colostomy on 05/26/2014.    She received concurrent chemotherapy (5FU and mitomycin-C) and radiation (06/29/2014 - 07/31/2014).  She missed 3 days of radiation (07/04-07/06/2014).  She has been off radiation since 08/23/2014 secondary to perirectal breakdown.  She received hyperbaric oxygen.  She was seen by Dr. Phoebe Perch on 01/02/2015.  Closure of her ostomy was not  recommended for at least 6 months.    She has macrocytic RBC indices.  Work-up on 09/28/2014 revealed a B12 deficiency (169) and folate deficiency (5.2).  TSH was normal.  She receives B12 (last 05/01/2015).  She is supposed to take folic acid (initially not taking secondary to costs).  She developed a left upper extremity DVT on 10/14/2014.  She underwent thrombolysis, thrombectomy, and port removal on 10/16/2014.  She was on Coumadin.  Left upper extremity duplex on 03/02/2015 revealed resolved thrombus in the right internal jugular, subclavian and axillary veins.  There was a small amount of residual thrombus in the proximal right brachial vein over a short segment (very low thrombus burden).  Coumadin was discontinued during her 03/05/2015 admission.  She was admitted to [facility] from 02/26/2015 - 03/03/2015 with a COPD exacerbation, RLL pneumonia, and small bowel obstruction.  She was treated with antibiotics and steroids.  Her small bowel obstruction resolved with conservative management.  She was admitted to [facility] from 03/05/2015 - 03/07/2015 with health care associated pneumonia.  She was treated with Zosyn and Levaquin and discharged on Levaquin and prednisone.  She was discharged on oxygen 2 liters/min vi nasal cannula to maintain sats > 90%.    She was admitted to [facility] from 03/15/2015 - 03/18/2015 with recurrent pneumonia. She presented with fever and dyspnea. She was treated with antibiotics and discharged on Augmentin. CXR on 04/08/2015 revealed a stable large right sided pleural effusion with compressive atelectasis.  She was admitted to [facility] from 04/08/2015 - 04/17/2015 with progressive shortness of breath and a large right sided pleural effusion. Right pleural biopsy and talc pleurodesis on 04/10/2015 revealed fragments of metastatic squamous cell carcinoma.  She was admitted to [facility] from 04/17/2015 - 04/21/2015 with GI bleed and melena.  EGD and colonoscopy on 04/19/2015 revealed only  diverticulosis in the sigmoid colon.  The etiology of her GI bleeding was unclear.  She was discharged on ciprofloxacin and Flagyl.  She also had an E coli and Klebsiella UTI.  She was admitted to [facility] from 04/22/2015 - 04/24/2015 with abdominal pain, generalized weakness and hypokalemia.  She was discharged to a skilled nursing facility.  PET scan on 05/03/2015 revealed no evidence of hypermetabolic local tumor recurrence in the anus.  There was new hypermetabolic right paratracheal and subcarinal mediastinal lymphadenopathy.  There was extensive hypermetabolism throughout the right pleural space.  There was a new mildly metabolic 0.7 cm nodule associated with the minor fissure.  There was no definite hypermetabolic metastatic disease in the abdomen or pelvis.  There was focal hypermetabolism in the left abdomen at the site of nondilated proximal small bowel loop with no associated mass or small bowel wall thickening.    She has anemia likely secondary to recent GI bleed and chronic disease.  Labs on 05/01/2015 revealed a normal ferritin (184), iron saturation (11%), and folate (27.0).  Retic was 4.2%.  Symptomatically, she has made good improvement since 05/01/2015.  She has mobilized 31 pounds of fluid weight.  Nutritional status remains poor (albumen 2.1), but improved.  She has 1+ bilateral lower extremity edema (left > right).  Plan: 1.  Labs today:  CBC with diff, CMP, Mg. 2.  Patient declines chemotherapy today secondary to port-a-cath pain.  Discuss chemotherapy plan in detail.  Plan to start chemotherapy next week.  Discuss nutritional issues as well as activity level. 3.  Phone follow-up with Dr. Lucky Cowboy re: prophylactic anticoagulation with port-a-cath.  Recommend Eliquis 2.5 mg po BID. 4.  Follow-up audiogram pre: cisplatin. 5.  Increase Ensure to TID. 6.  Continue B12 monthly (next due 05/29/2015).  Continue folic acid daily. 7.  Rx:  Eliquis 2.5 mg po BID for port-a-cath prophylaxis. 8.   Rx:  Percocet 10/325 1 tablet po q 6 hours prn pain dis: # 60. 9.  RTC on 05/24/2015 for MD reassessment, labs (CBC with diff, CMP, Mg) and cycle #1 cisplatin and 5-fluorouracil.   Lequita Asal, MD  05/15/2015, 9:26 AM

## 2015-05-17 ENCOUNTER — Encounter: Payer: Self-pay | Admitting: Hematology and Oncology

## 2015-05-21 ENCOUNTER — Ambulatory Visit: Payer: Self-pay | Admitting: *Deleted

## 2015-05-21 ENCOUNTER — Other Ambulatory Visit: Payer: Self-pay | Admitting: *Deleted

## 2015-05-21 NOTE — Patient Outreach (Signed)
Arrived at pt's home for scheduled home visit, knocked on door several times, no answer.   Chrystal [facility] social worker arrived also at pt's home, scheduled to do a joint visit.   Chrystal called pt's friend Quita Skye on [facility] consent form, voice message left.  While still at pt's home, [name] came out - reports pt is sleeping, does not want to be seen today, sick.  [name] reports her aunt Quita Skye is not at home.   Both this RN CM and social worker provided [name] with contact cards to give to pt to call.  RN CM to f/u with pt again telephonically.     [name]   [place] [facility]  [phone]

## 2015-05-21 NOTE — Patient Outreach (Signed)
[place] [facility] Care Management  05/21/2015  [name] 06-01-1955 [id number]   This social worker and RNCM arrived to patient's home, however patient refused visit.  Sent her roommate's niece [name] to the door stating that patient did not feel well and did not want a visit today. Business  cards with contact information for this social worker and RNCM left for patient to call to re-schedule home visit.   [name] [facility] Care Management [phone]

## 2015-05-22 ENCOUNTER — Encounter: Payer: Self-pay | Admitting: Unknown Physician Specialty

## 2015-05-22 ENCOUNTER — Ambulatory Visit (INDEPENDENT_AMBULATORY_CARE_PROVIDER_SITE_OTHER): Payer: Commercial Managed Care - HMO | Admitting: Unknown Physician Specialty

## 2015-05-22 VITALS — BP 127/80 | HR 93 | Temp 97.4°F | Ht 65.2 in | Wt 189.5 lb

## 2015-05-22 DIAGNOSIS — Z7409 Other reduced mobility: Secondary | ICD-10-CM | POA: Diagnosis not present

## 2015-05-22 DIAGNOSIS — Z09 Encounter for follow-up examination after completed treatment for conditions other than malignant neoplasm: Secondary | ICD-10-CM

## 2015-05-22 DIAGNOSIS — G893 Neoplasm related pain (acute) (chronic): Secondary | ICD-10-CM | POA: Diagnosis not present

## 2015-05-22 DIAGNOSIS — J439 Emphysema, unspecified: Secondary | ICD-10-CM | POA: Diagnosis not present

## 2015-05-22 MED ORDER — OXYCODONE-ACETAMINOPHEN 10-325 MG PO TABS: 1.0000 | ORAL_TABLET | Freq: Four times a day (QID) | ORAL | Status: DC | PRN

## 2015-05-22 NOTE — Progress Notes (Signed)
BP 127/80 mmHg  Pulse 93  Temp(Src) 97.4 F (36.3 C)  Ht 5' 5.2" (1.656 m)  Wt 189 lb 8 oz (85.957 kg)  BMI 31.34 kg/m2  SpO2 99%   Subjective:    Patient ID: [name], female    DOB: [birth year], 60 y.o.   MRN: [id number]  HPI: [name] is a 60 y.o. female  Chief Complaint  Patient presents with  . Hospitalization Follow-up  . Pain    pt states she would like something for pain until she can go see pain management on the 24th   F/u hospitalization "I'm doing a whole lot better since before they put me in the hospital" Staying with her family Mobility slowly improving.  Needs referral to "[place] for supplies."  States she needs a wheelchair and unable to heavier tank.  A wheelchair would allow her to go outside and get in a car. Also needs a "sit and stand" walker with basket.    Appetite improved "They have me on Magnesium, potassium and iron pills"  COPD Spiriva and Symbicort taking daily.   On 2 Liters of continuous O2 Using medications without problems  Advance care planning Pt states she would like to have chest compressions "only once."  Her has power of attorney and knows her wishes.  She only wants to be on a ventilator for a short period of time.    Pain control Continue right sided chest pain seems to be from metastatic disease.  Requesting 4 Oxycodone 10 mg daily.  Dr. Mike Gip gave her enough to get her through today  Relevant past medical, surgical, family and social history reviewed and updated as indicated. Interim medical history since our last visit reviewed. Allergies and medications reviewed and updated.  Review of Systems  Per HPI unless specifically indicated above     Objective:    BP 127/80 mmHg  Pulse 93  Temp(Src) 97.4 F (36.3 C)  Ht 5' 5.2" (1.656 m)  Wt 189 lb 8 oz (85.957 kg)  BMI 31.34 kg/m2  SpO2 99%  Wt Readings from Last 3 Encounters:  05/22/15 189 lb 8 oz (85.957 kg)  05/15/15 190 lb 12.9 oz  (86.55 kg)  05/08/15 199 lb (90.266 kg)    Physical Exam  Constitutional: She appears ill. No distress.  HENT:  Head: Normocephalic and atraumatic.  Cardiovascular: Normal rate and regular rhythm.   Pulmonary/Chest: She has decreased breath sounds. She has wheezes.  Skin:  Right port site healing  Psychiatric: She has a normal mood and affect. Her speech is normal.    Results for orders placed or performed in visit on 05/15/15  CBC with Differential/Platelet  Result Value Ref Range   WBC 10.5 3.6 - 11.0 K/uL   RBC 3.02 (L) 3.80 - 5.20 MIL/uL   Hemoglobin 8.5 (L) 12.0 - 16.0 g/dL   HCT 26.2 (L) 35.0 - 47.0 %   MCV 86.5 80.0 - 100.0 fL   MCH 28.2 26.0 - 34.0 pg   MCHC 32.6 32.0 - 36.0 g/dL   RDW 18.3 (H) 11.5 - 14.5 %   Platelets 375 150 - 440 K/uL   Neutrophils Relative % 68 %   Neutro Abs 7.1 (H) 1.4 - 6.5 K/uL   Lymphocytes Relative 13 %   Lymphs Abs 1.4 1.0 - 3.6 K/uL   Monocytes Relative 6 %   Monocytes Absolute 0.6 0.2 - 0.9 K/uL   Eosinophils Relative 12 %   Eosinophils Absolute 1.3 (  H) 0 - 0.7 K/uL   Basophils Relative 1 %   Basophils Absolute 0.1 0 - 0.1 K/uL  Comprehensive metabolic panel  Result Value Ref Range   Sodium 136 135 - 145 mmol/L   Potassium 3.7 3.5 - 5.1 mmol/L   Chloride 102 101 - 111 mmol/L   CO2 28 22 - 32 mmol/L   Glucose, Bld 108 (H) 65 - 99 mg/dL   BUN 10 6 - 20 mg/dL   Creatinine, Ser 0.72 0.44 - 1.00 mg/dL   Calcium 9.7 8.9 - 10.3 mg/dL   Total Protein 6.1 (L) 6.5 - 8.1 g/dL   Albumin 2.1 (L) 3.5 - 5.0 g/dL   AST 48 (H) 15 - 41 U/L   ALT 14 14 - 54 U/L   Alkaline Phosphatase 110 38 - 126 U/L   Total Bilirubin 0.5 0.3 - 1.2 mg/dL   GFR calc non Af Amer >60 >60 mL/min   GFR calc Af Amer >60 >60 mL/min   Anion gap 6 5 - 15  Magnesium  Result Value Ref Range   Magnesium 1.8 1.7 - 2.4 mg/dL   *Note: Due to a large number of results and/or encounters for the requested time period, some results have not been displayed. A complete set of  results can be found in Results Review.      Assessment & Plan:   Problem List Items Addressed This Visit      Unprioritized   Cancer-related pain (Chronic)    Rx given for Oxycodone 10 mg QID      COPD (chronic obstructive pulmonary disease) ([place])    O2 dependent.  Increase Albuterol due to wheezing today      Hospital discharge follow-up    Stable.  Continue home health      Impaired mobility - Primary    Prescription given for walker, wheelchair, and lighter weight O2 tank.  Continue with home health, OT and occupational therapy         No labs today with frequent labs done at the cancer center  Follow up plan: Return in about 4 weeks (around 06/19/2015).

## 2015-05-22 NOTE — Assessment & Plan Note (Addendum)
Prescription given for walker, wheelchair, and lighter weight O2 tank.  Continue with home health, OT and occupational therapy

## 2015-05-22 NOTE — Assessment & Plan Note (Addendum)
O2 dependent.  Increase Albuterol due to wheezing today

## 2015-05-22 NOTE — Assessment & Plan Note (Signed)
Stable.  Continue home health

## 2015-05-22 NOTE — Assessment & Plan Note (Signed)
Rx given for Oxycodone 10 mg QID

## 2015-05-24 ENCOUNTER — Inpatient Hospital Stay (HOSPITAL_BASED_OUTPATIENT_CLINIC_OR_DEPARTMENT_OTHER): Payer: Commercial Managed Care - HMO | Admitting: Hematology and Oncology

## 2015-05-24 ENCOUNTER — Encounter: Payer: Self-pay | Admitting: Emergency Medicine

## 2015-05-24 ENCOUNTER — Emergency Department: Payer: Commercial Managed Care - HMO

## 2015-05-24 ENCOUNTER — Encounter: Payer: Self-pay | Admitting: Hematology and Oncology

## 2015-05-24 ENCOUNTER — Inpatient Hospital Stay
Admission: EM | Admit: 2015-05-24 | Discharge: 2015-05-25 | DRG: 190 | Disposition: A | Discharge status: Home / Self Care | Payer: Commercial Managed Care - HMO | Attending: Internal Medicine | Admitting: Internal Medicine

## 2015-05-24 ENCOUNTER — Other Ambulatory Visit: Payer: Self-pay | Admitting: Hematology and Oncology

## 2015-05-24 ENCOUNTER — Inpatient Hospital Stay: Payer: Commercial Managed Care - HMO

## 2015-05-24 VITALS — BP 134/84 | HR 114 | Temp 96.0°F | Resp 24 | Ht 65.0 in | Wt 192.6 lb

## 2015-05-24 DIAGNOSIS — Z86718 Personal history of other venous thrombosis and embolism: Secondary | ICD-10-CM

## 2015-05-24 DIAGNOSIS — F418 Other specified anxiety disorders: Secondary | ICD-10-CM

## 2015-05-24 DIAGNOSIS — F419 Anxiety disorder, unspecified: Secondary | ICD-10-CM | POA: Diagnosis present

## 2015-05-24 DIAGNOSIS — J9621 Acute and chronic respiratory failure with hypoxia: Secondary | ICD-10-CM | POA: Diagnosis present

## 2015-05-24 DIAGNOSIS — Z933 Colostomy status: Secondary | ICD-10-CM

## 2015-05-24 DIAGNOSIS — G4733 Obstructive sleep apnea (adult) (pediatric): Secondary | ICD-10-CM | POA: Diagnosis present

## 2015-05-24 DIAGNOSIS — R000 Tachycardia, unspecified: Secondary | ICD-10-CM

## 2015-05-24 DIAGNOSIS — F209 Schizophrenia, unspecified: Secondary | ICD-10-CM | POA: Diagnosis present

## 2015-05-24 DIAGNOSIS — H903 Sensorineural hearing loss, bilateral: Secondary | ICD-10-CM | POA: Diagnosis present

## 2015-05-24 DIAGNOSIS — G629 Polyneuropathy, unspecified: Secondary | ICD-10-CM

## 2015-05-24 DIAGNOSIS — Z79891 Long term (current) use of opiate analgesic: Secondary | ICD-10-CM

## 2015-05-24 DIAGNOSIS — R590 Localized enlarged lymph nodes: Secondary | ICD-10-CM | POA: Diagnosis not present

## 2015-05-24 DIAGNOSIS — M797 Fibromyalgia: Secondary | ICD-10-CM | POA: Diagnosis present

## 2015-05-24 DIAGNOSIS — Z833 Family history of diabetes mellitus: Secondary | ICD-10-CM | POA: Diagnosis not present

## 2015-05-24 DIAGNOSIS — Z823 Family history of stroke: Secondary | ICD-10-CM

## 2015-05-24 DIAGNOSIS — Z85048 Personal history of other malignant neoplasm of rectum, rectosigmoid junction, and anus: Secondary | ICD-10-CM | POA: Diagnosis not present

## 2015-05-24 DIAGNOSIS — Z7982 Long term (current) use of aspirin: Secondary | ICD-10-CM

## 2015-05-24 DIAGNOSIS — M47814 Spondylosis without myelopathy or radiculopathy, thoracic region: Secondary | ICD-10-CM | POA: Diagnosis present

## 2015-05-24 DIAGNOSIS — R609 Edema, unspecified: Secondary | ICD-10-CM

## 2015-05-24 DIAGNOSIS — M4800 Spinal stenosis, site unspecified: Secondary | ICD-10-CM

## 2015-05-24 DIAGNOSIS — M47816 Spondylosis without myelopathy or radiculopathy, lumbar region: Secondary | ICD-10-CM | POA: Diagnosis present

## 2015-05-24 DIAGNOSIS — Z79899 Other long term (current) drug therapy: Secondary | ICD-10-CM

## 2015-05-24 DIAGNOSIS — R591 Generalized enlarged lymph nodes: Secondary | ICD-10-CM

## 2015-05-24 DIAGNOSIS — Z8249 Family history of ischemic heart disease and other diseases of the circulatory system: Secondary | ICD-10-CM

## 2015-05-24 DIAGNOSIS — J441 Chronic obstructive pulmonary disease with (acute) exacerbation: Secondary | ICD-10-CM

## 2015-05-24 DIAGNOSIS — Z923 Personal history of irradiation: Secondary | ICD-10-CM

## 2015-05-24 DIAGNOSIS — Z809 Family history of malignant neoplasm, unspecified: Secondary | ICD-10-CM

## 2015-05-24 DIAGNOSIS — F4310 Post-traumatic stress disorder, unspecified: Secondary | ICD-10-CM | POA: Diagnosis present

## 2015-05-24 DIAGNOSIS — J9620 Acute and chronic respiratory failure, unspecified whether with hypoxia or hypercapnia: Secondary | ICD-10-CM | POA: Diagnosis present

## 2015-05-24 DIAGNOSIS — K760 Fatty (change of) liver, not elsewhere classified: Secondary | ICD-10-CM

## 2015-05-24 DIAGNOSIS — R0602 Shortness of breath: Secondary | ICD-10-CM

## 2015-05-24 DIAGNOSIS — R251 Tremor, unspecified: Secondary | ICD-10-CM

## 2015-05-24 DIAGNOSIS — C210 Malignant neoplasm of anus, unspecified: Secondary | ICD-10-CM | POA: Diagnosis not present

## 2015-05-24 DIAGNOSIS — Z91013 Allergy to seafood: Secondary | ICD-10-CM | POA: Diagnosis not present

## 2015-05-24 DIAGNOSIS — Z882 Allergy status to sulfonamides status: Secondary | ICD-10-CM

## 2015-05-24 DIAGNOSIS — F319 Bipolar disorder, unspecified: Secondary | ICD-10-CM | POA: Diagnosis present

## 2015-05-24 DIAGNOSIS — I2510 Atherosclerotic heart disease of native coronary artery without angina pectoris: Secondary | ICD-10-CM

## 2015-05-24 DIAGNOSIS — Z818 Family history of other mental and behavioral disorders: Secondary | ICD-10-CM

## 2015-05-24 DIAGNOSIS — M546 Pain in thoracic spine: Secondary | ICD-10-CM

## 2015-05-24 DIAGNOSIS — D649 Anemia, unspecified: Secondary | ICD-10-CM

## 2015-05-24 DIAGNOSIS — M5136 Other intervertebral disc degeneration, lumbar region: Secondary | ICD-10-CM

## 2015-05-24 DIAGNOSIS — Z7901 Long term (current) use of anticoagulants: Secondary | ICD-10-CM

## 2015-05-24 DIAGNOSIS — F0390 Unspecified dementia without behavioral disturbance: Secondary | ICD-10-CM | POA: Diagnosis present

## 2015-05-24 DIAGNOSIS — E039 Hypothyroidism, unspecified: Secondary | ICD-10-CM | POA: Diagnosis present

## 2015-05-24 DIAGNOSIS — K219 Gastro-esophageal reflux disease without esophagitis: Secondary | ICD-10-CM | POA: Diagnosis present

## 2015-05-24 DIAGNOSIS — G894 Chronic pain syndrome: Secondary | ICD-10-CM | POA: Diagnosis present

## 2015-05-24 DIAGNOSIS — C3490 Malignant neoplasm of unspecified part of unspecified bronchus or lung: Secondary | ICD-10-CM | POA: Diagnosis present

## 2015-05-24 DIAGNOSIS — Z87891 Personal history of nicotine dependence: Secondary | ICD-10-CM

## 2015-05-24 DIAGNOSIS — J45909 Unspecified asthma, uncomplicated: Secondary | ICD-10-CM

## 2015-05-24 DIAGNOSIS — Z841 Family history of disorders of kidney and ureter: Secondary | ICD-10-CM | POA: Diagnosis not present

## 2015-05-24 DIAGNOSIS — Z9221 Personal history of antineoplastic chemotherapy: Secondary | ICD-10-CM

## 2015-05-24 DIAGNOSIS — E041 Nontoxic single thyroid nodule: Secondary | ICD-10-CM

## 2015-05-24 DIAGNOSIS — E538 Deficiency of other specified B group vitamins: Secondary | ICD-10-CM

## 2015-05-24 DIAGNOSIS — J449 Chronic obstructive pulmonary disease, unspecified: Secondary | ICD-10-CM

## 2015-05-24 DIAGNOSIS — G4730 Sleep apnea, unspecified: Secondary | ICD-10-CM

## 2015-05-24 DIAGNOSIS — Z9981 Dependence on supplemental oxygen: Secondary | ICD-10-CM

## 2015-05-24 DIAGNOSIS — I714 Abdominal aortic aneurysm, without rupture: Secondary | ICD-10-CM

## 2015-05-24 DIAGNOSIS — F17210 Nicotine dependence, cigarettes, uncomplicated: Secondary | ICD-10-CM

## 2015-05-24 DIAGNOSIS — Z8701 Personal history of pneumonia (recurrent): Secondary | ICD-10-CM

## 2015-05-24 LAB — CBC WITH DIFFERENTIAL/PLATELET
BASOS ABS: 0.1 10*3/uL (ref 0–0.1)
Basophils Absolute: 0.1 10*3/uL (ref 0–0.1)
Basophils Relative: 1 %
Eosinophils Absolute: 1.7 10*3/uL — ABNORMAL HIGH (ref 0–0.7)
Eosinophils Absolute: 1.4 10*3/uL — ABNORMAL HIGH (ref 0–0.7)
Eosinophils Relative: 14 %
HCT: 23.1 % — ABNORMAL LOW (ref 35.0–47.0)
HCT: 23.1 % — ABNORMAL LOW (ref 35.0–47.0)
Hemoglobin: 7.4 g/dL — ABNORMAL LOW (ref 12.0–16.0)
Hemoglobin: 7.3 g/dL — ABNORMAL LOW (ref 12.0–16.0)
Lymphocytes Relative: 12 %
Lymphocytes Relative: 10 %
Lymphs Abs: 1.5 10*3/uL (ref 1.0–3.6)
Lymphs Abs: 1.1 10*3/uL (ref 1.0–3.6)
MCH: 28.3 pg (ref 26.0–34.0)
MCH: 28.1 pg (ref 26.0–34.0)
MCHC: 32 g/dL (ref 32.0–36.0)
MCHC: 31.6 g/dL — AB (ref 32.0–36.0)
MCV: 88.5 fL (ref 80.0–100.0)
MCV: 88.9 fL (ref 80.0–100.0)
MONO ABS: 0.8 10*3/uL (ref 0.2–0.9)
Monocytes Absolute: 0.8 10*3/uL (ref 0.2–0.9)
Monocytes Relative: 6 %
Neutro Abs: 8.4 10*3/uL — ABNORMAL HIGH (ref 1.4–6.5)
Neutro Abs: 7.6 10*3/uL — ABNORMAL HIGH (ref 1.4–6.5)
Neutrophils Relative %: 67 %
Neutrophils Relative %: 68 %
PLATELETS: 191 10*3/uL (ref 150–440)
Platelets: 209 10*3/uL (ref 150–440)
RBC: 2.62 MIL/uL — ABNORMAL LOW (ref 3.80–5.20)
RBC: 2.6 MIL/uL — ABNORMAL LOW (ref 3.80–5.20)
RDW: 19.7 % — ABNORMAL HIGH (ref 11.5–14.5)
RDW: 19.7 % — AB (ref 11.5–14.5)
WBC: 12.6 10*3/uL — ABNORMAL HIGH (ref 3.6–11.0)
WBC: 11 10*3/uL (ref 3.6–11.0)

## 2015-05-24 LAB — COMPREHENSIVE METABOLIC PANEL
ALBUMIN: 2 g/dL — AB (ref 3.5–5.0)
ALT: 14 U/L (ref 14–54)
ANION GAP: 3 — AB (ref 5–15)
AST: 48 U/L — AB (ref 15–41)
Alkaline Phosphatase: 119 U/L (ref 38–126)
BUN: 12 mg/dL (ref 6–20)
CHLORIDE: 99 mmol/L — AB (ref 101–111)
CO2: 31 mmol/L (ref 22–32)
Calcium: 9.7 mg/dL (ref 8.9–10.3)
Creatinine, Ser: 0.82 mg/dL (ref 0.44–1.00)
GFR calc Af Amer: >60 mL/min (ref >60)
GLUCOSE: 106 mg/dL — AB (ref 65–99)
POTASSIUM: 3.6 mmol/L (ref 3.5–5.1)
Sodium: 133 mmol/L — ABNORMAL LOW (ref 135–145)
TOTAL PROTEIN: 5.7 g/dL — AB (ref 6.5–8.1)
Total Bilirubin: 0.5 mg/dL (ref 0.3–1.2)

## 2015-05-24 LAB — COMPREHENSIVE METABOLIC PANEL WITH GFR
ALT: 13 U/L — ABNORMAL LOW (ref 14–54)
AST: 50 U/L — ABNORMAL HIGH (ref 15–41)
Albumin: 2 g/dL — ABNORMAL LOW (ref 3.5–5.0)
Alkaline Phosphatase: 144 U/L — ABNORMAL HIGH (ref 38–126)
Anion gap: 4 — ABNORMAL LOW (ref 5–15)
BUN: 12 mg/dL (ref 6–20)
CO2: 29 mmol/L (ref 22–32)
Calcium: 9.6 mg/dL (ref 8.9–10.3)
Chloride: 99 mmol/L — ABNORMAL LOW (ref 101–111)
Creatinine, Ser: 0.83 mg/dL (ref 0.44–1.00)
GFR calc Af Amer: >60 mL/min
GFR calc non Af Amer: >60 mL/min
Glucose, Bld: 145 mg/dL — ABNORMAL HIGH (ref 65–99)
Potassium: 3.3 mmol/L — ABNORMAL LOW (ref 3.5–5.1)
Sodium: 132 mmol/L — ABNORMAL LOW (ref 135–145)
Total Bilirubin: 0.6 mg/dL (ref 0.3–1.2)
Total Protein: 6 g/dL — ABNORMAL LOW (ref 6.5–8.1)

## 2015-05-24 LAB — TROPONIN I

## 2015-05-24 LAB — BRAIN NATRIURETIC PEPTIDE: B Natriuretic Peptide: 125 pg/mL — ABNORMAL HIGH (ref 0.0–100.0)

## 2015-05-24 LAB — MAGNESIUM: Magnesium: 1.7 mg/dL (ref 1.7–2.4)

## 2015-05-24 MED ORDER — PANTOPRAZOLE SODIUM 40 MG PO TBEC
40.0000 mg | DELAYED_RELEASE_TABLET | Freq: Two times a day (BID) | ORAL | Status: DC
  Administered 2015-05-24 – 2015-05-25 (×3): 40 mg via ORAL
  Filled 2015-05-24 (×3): qty 1

## 2015-05-24 MED ORDER — OXYCODONE-ACETAMINOPHEN 5-325 MG PO TABS
2.0000 | ORAL_TABLET | Freq: Once | ORAL | Status: AC
  Administered 2015-05-24: 2 via ORAL
  Filled 2015-05-24: qty 2

## 2015-05-24 MED ORDER — IPRATROPIUM-ALBUTEROL 0.5-2.5 (3) MG/3ML IN SOLN
3.0000 mL | Freq: Once | RESPIRATORY_TRACT | Status: AC
  Administered 2015-05-24: 3 mL via RESPIRATORY_TRACT
  Filled 2015-05-24: qty 3

## 2015-05-24 MED ORDER — ALBUTEROL SULFATE HFA 108 (90 BASE) MCG/ACT IN AERS: 1.0000 | INHALATION_SPRAY | Freq: Four times a day (QID) | RESPIRATORY_TRACT | Status: DC | PRN

## 2015-05-24 MED ORDER — ACETAMINOPHEN 325 MG PO TABS: 650.0000 mg | ORAL_TABLET | Freq: Four times a day (QID) | ORAL | Status: DC | PRN

## 2015-05-24 MED ORDER — ARIPIPRAZOLE 2 MG PO TABS
2.0000 mg | ORAL_TABLET | Freq: Every day | ORAL | Status: DC
  Administered 2015-05-24 – 2015-05-25 (×2): 2 mg via ORAL
  Filled 2015-05-24 (×3): qty 1

## 2015-05-24 MED ORDER — SODIUM CHLORIDE 0.9% FLUSH
10.0000 mL | INTRAVENOUS | Status: DC | PRN
  Administered 2015-05-24: 10 mL
  Filled 2015-05-24: qty 10

## 2015-05-24 MED ORDER — METHYLPREDNISOLONE SODIUM SUCC 125 MG IJ SOLR
125.0000 mg | Freq: Once | INTRAMUSCULAR | Status: AC
  Administered 2015-05-24: 125 mg via INTRAVENOUS
  Filled 2015-05-24: qty 2

## 2015-05-24 MED ORDER — VITAMIN B-12 1000 MCG PO TABS
1000.0000 ug | ORAL_TABLET | Freq: Every day | ORAL | Status: DC
  Administered 2015-05-24 – 2015-05-25 (×2): 1000 ug via ORAL
  Filled 2015-05-24 (×2): qty 1

## 2015-05-24 MED ORDER — HEPARIN SOD (PORK) LOCK FLUSH 100 UNIT/ML IV SOLN: 500.0000 [IU] | Freq: Once | INTRAVENOUS | Status: DC | PRN

## 2015-05-24 MED ORDER — DULOXETINE HCL 60 MG PO CPEP
60.0000 mg | ORAL_CAPSULE | ORAL | Status: DC
  Administered 2015-05-25: 09:00:00 60 mg via ORAL
  Filled 2015-05-24: qty 1

## 2015-05-24 MED ORDER — FERROUS SULFATE 325 (65 FE) MG PO TABS
325.0000 mg | ORAL_TABLET | Freq: Two times a day (BID) | ORAL | Status: DC
  Administered 2015-05-24 – 2015-05-25 (×2): 325 mg via ORAL
  Filled 2015-05-24 (×2): qty 1

## 2015-05-24 MED ORDER — ZOLPIDEM TARTRATE 5 MG PO TABS: 10.0000 mg | ORAL_TABLET | Freq: Every evening | ORAL | Status: DC | PRN

## 2015-05-24 MED ORDER — ASPIRIN EC 81 MG PO TBEC
81.0000 mg | DELAYED_RELEASE_TABLET | Freq: Every day | ORAL | Status: DC
  Administered 2015-05-24 – 2015-05-25 (×2): 81 mg via ORAL
  Filled 2015-05-24 (×2): qty 1

## 2015-05-24 MED ORDER — SENNOSIDES-DOCUSATE SODIUM 8.6-50 MG PO TABS: 1.0000 | ORAL_TABLET | Freq: Every evening | ORAL | Status: DC | PRN

## 2015-05-24 MED ORDER — ACETAMINOPHEN 650 MG RE SUPP: 650.0000 mg | Freq: Four times a day (QID) | RECTAL | Status: DC | PRN

## 2015-05-24 MED ORDER — TIOTROPIUM BROMIDE MONOHYDRATE 18 MCG IN CAPS
18.0000 ug | ORAL_CAPSULE | Freq: Every day | RESPIRATORY_TRACT | Status: DC
  Administered 2015-05-25: 18 ug via RESPIRATORY_TRACT
  Filled 2015-05-24: qty 5

## 2015-05-24 MED ORDER — OXYCODONE-ACETAMINOPHEN 10-325 MG PO TABS: 1.0000 | ORAL_TABLET | Freq: Four times a day (QID) | ORAL | Status: DC | PRN

## 2015-05-24 MED ORDER — MOMETASONE FURO-FORMOTEROL FUM 200-5 MCG/ACT IN AERO
2.0000 | INHALATION_SPRAY | Freq: Two times a day (BID) | RESPIRATORY_TRACT | Status: DC
  Administered 2015-05-24 – 2015-05-25 (×2): 2 via RESPIRATORY_TRACT
  Filled 2015-05-24: qty 8.8

## 2015-05-24 MED ORDER — ESCITALOPRAM OXALATE 10 MG PO TABS
20.0000 mg | ORAL_TABLET | Freq: Every day | ORAL | Status: DC
  Administered 2015-05-24 – 2015-05-25 (×2): 20 mg via ORAL
  Filled 2015-05-24 (×2): qty 2

## 2015-05-24 MED ORDER — ENSURE ENLIVE PO LIQD: 237.0000 mL | Freq: Two times a day (BID) | ORAL | Status: DC

## 2015-05-24 MED ORDER — ONDANSETRON HCL 4 MG/2ML IJ SOLN: 4.0000 mg | Freq: Four times a day (QID) | INTRAMUSCULAR | Status: DC | PRN

## 2015-05-24 MED ORDER — ONDANSETRON HCL 4 MG PO TABS: 4.0000 mg | ORAL_TABLET | Freq: Four times a day (QID) | ORAL | Status: DC | PRN

## 2015-05-24 MED ORDER — DOCUSATE SODIUM 100 MG PO CAPS
100.0000 mg | ORAL_CAPSULE | Freq: Two times a day (BID) | ORAL | Status: DC
  Administered 2015-05-24 – 2015-05-25 (×3): 100 mg via ORAL
  Filled 2015-05-24 (×3): qty 1

## 2015-05-24 MED ORDER — POLYETHYLENE GLYCOL 3350 17 G PO PACK
17.0000 g | PACK | Freq: Every day | ORAL | Status: DC
  Administered 2015-05-25: 17 g via ORAL
  Filled 2015-05-24: qty 1

## 2015-05-24 MED ORDER — ENSURE ENLIVE PO LIQD
237.0000 mL | Freq: Two times a day (BID) | ORAL | Status: DC
  Administered 2015-05-24 – 2015-05-25 (×2): 237 mL via ORAL

## 2015-05-24 MED ORDER — IPRATROPIUM-ALBUTEROL 0.5-2.5 (3) MG/3ML IN SOLN
3.0000 mL | Freq: Four times a day (QID) | RESPIRATORY_TRACT | Status: DC
  Administered 2015-05-24 – 2015-05-25 (×3): 3 mL via RESPIRATORY_TRACT
  Filled 2015-05-24 (×3): qty 3

## 2015-05-24 MED ORDER — METHYLPREDNISOLONE SODIUM SUCC 125 MG IJ SOLR
60.0000 mg | INTRAMUSCULAR | Status: DC
  Administered 2015-05-25: 60 mg via INTRAVENOUS
  Filled 2015-05-24: qty 2

## 2015-05-24 MED ORDER — FOLIC ACID 1 MG PO TABS
1.0000 mg | ORAL_TABLET | Freq: Every day | ORAL | Status: DC
  Administered 2015-05-24 – 2015-05-25 (×2): 1 mg via ORAL
  Filled 2015-05-24 (×2): qty 1

## 2015-05-24 MED ORDER — NICOTINE 14 MG/24HR TD PT24
14.0000 mg | MEDICATED_PATCH | Freq: Every day | TRANSDERMAL | Status: DC
  Administered 2015-05-25: 09:00:00 14 mg via TRANSDERMAL
  Filled 2015-05-24: qty 1

## 2015-05-24 MED ORDER — CLONAZEPAM 0.5 MG PO TABS
1.0000 mg | ORAL_TABLET | Freq: Two times a day (BID) | ORAL | Status: DC | PRN
  Administered 2015-05-24: 21:00:00 1 mg via ORAL
  Filled 2015-05-24: qty 2

## 2015-05-24 MED ORDER — ALBUTEROL SULFATE (2.5 MG/3ML) 0.083% IN NEBU: 2.5000 mg | INHALATION_SOLUTION | RESPIRATORY_TRACT | Status: DC | PRN

## 2015-05-24 MED ORDER — OXYCODONE-ACETAMINOPHEN 5-325 MG PO TABS
1.0000 | ORAL_TABLET | Freq: Four times a day (QID) | ORAL | Status: DC | PRN
  Administered 2015-05-24 – 2015-05-25 (×2): 1 via ORAL
  Filled 2015-05-24 (×2): qty 1

## 2015-05-24 MED ORDER — VITAMIN D 1000 UNITS PO TABS
1000.0000 [IU] | ORAL_TABLET | Freq: Every day | ORAL | Status: DC
Start: 2015-05-24 — End: 2015-05-25
  Administered 2015-05-24 – 2015-05-25 (×2): 1000 [IU] via ORAL
  Filled 2015-05-24 (×2): qty 1

## 2015-05-24 MED ORDER — APIXABAN 2.5 MG PO TABS
2.5000 mg | ORAL_TABLET | Freq: Two times a day (BID) | ORAL | Status: DC
  Administered 2015-05-24 – 2015-05-25 (×3): 2.5 mg via ORAL
  Filled 2015-05-24 (×5): qty 1

## 2015-05-24 MED ORDER — OXYCODONE HCL 5 MG PO TABS
5.0000 mg | ORAL_TABLET | Freq: Four times a day (QID) | ORAL | Status: DC | PRN
  Administered 2015-05-24 – 2015-05-25 (×2): 5 mg via ORAL
  Filled 2015-05-24 (×2): qty 1

## 2015-05-24 NOTE — ED Notes (Signed)
Pt here from cancer center with shortness of breath since last night; pt dx with lung CA and was supposed to start chemo today. Pt recently started on eliquis 2.'5mg'$  daily (05/15/15). Pt reports productive cough with gray sputum. Pt on 2L chronic oxygen.

## 2015-05-24 NOTE — Progress Notes (Signed)
Pt is complaining of more SOB.  o2 saturation upon placing was 84 on 2L o2.  However could not get stas to last pts fingers cold.  Placed warm blanket and saturation went from 92-100.  Pt complains of more pain in right rib lung area.

## 2015-05-24 NOTE — ED Notes (Signed)
Pt finished her duoneb treatments and placed into a comfortable position. States she has a nebulizer at home with medicine but has not used it lately.

## 2015-05-24 NOTE — ED Notes (Signed)
Pt's port accessed upon arrival. Pulled blood out of port access.

## 2015-05-24 NOTE — Progress Notes (Signed)
Pt was taken to ER. She was c/o of SOB that started last night at home and it took a while for her to walk to bathroom, then wait to rtn and then go back to bed and had to sit on side of bed to get breath enough to get her legs in bad.  Her fingers were so cold it would not take pulse ox finally after 15 min it registered at 84 then came to 92.  She is on 2 liters at home of oxygen all the time.  She was c/o of pain in her back but worse than usual and had taken pain pill at home before coming this am. Mike Gip assessed her and even on her deepest breath could barely here lung sounds. And because she had clot before and sudden sob and sats low and breath sounds shallow sent to ED.

## 2015-05-24 NOTE — ED Provider Notes (Signed)
[facility] Emergency Department Provider Note  ____________________________________________    I have reviewed the triage vital signs and the nursing notes.   HISTORY  Chief Complaint Shortness of Breath    HPI [name] is a 60 y.o. female who presents with shortness of breath. Patient was seen by Dr. Mike Gip her oncologist who is treating her for anal cancer. She is extremely short of breath in the office today and Dr. Enos Fling to the emergency department for further evaluation. Patient denies pleurisy or chest pain. She does describe tightness in her chest. She does have a history of COPD and is on 2 L of oxygen at home. She denies fevers or chills. No recent travel. No calf pain or swelling.     Past Medical History  Diagnosis Date  . Schizophrenia ([place])   . Asthma   . GERD (gastroesophageal reflux disease)   . Anxiety   . Depression   . Bipolar disorder ([place])   . COPD (chronic obstructive pulmonary disease) ([place])   . Occasional tremors     right hand  . PTSD (post-traumatic stress disorder)   . Shortness of breath dyspnea   . Fibromyalgia   . DVT (deep venous thrombosis) ([place]) 2011    RUE  . Thyroid nodule   . DDD (degenerative disc disease), lumbar   . Spinal stenosis   . Peripheral neuropathy ([place])   . Rotator cuff tear     right  . Pneumonia 2011  . Hypothyroidism     no meds currently  . Anemia     during pregnancy only  . Hypertension     Off meds x 15 years-well controlled now per pt  . Severe obstructive sleep apnea 06/27/2014  . DVT of upper extremity (deep vein thrombosis) ([place]) 10/14/2014  . Squamous cell cancer, anus (HCC)   . Lung cancer ([place])   . Hemorrhoid 06/27/2014  . SBO (small bowel obstruction) [facility])     Patient Active Problem List   Diagnosis Date Noted  . Sensorineural hearing loss of both ears 05/24/2015  . Impaired mobility 05/22/2015  . Malnutrition ([place]) 05/03/2015  . Lung cancer ([place])  05/01/2015  . Osteoporosis 05/01/2015  . Chronic low back pain 05/01/2015  . Radicular pain of thoracic region 05/01/2015  . Lumbar facet arthropathy 05/01/2015  . Lumbar facet syndrome 05/01/2015  . Thoracic spondylosis 05/01/2015  . Lumbar spondylosis 05/01/2015  . T12 compression fracture ([place]) (Since 2013) 05/01/2015  . T6 compression fracture (since 2015) 05/01/2015  . Chronic pain 04/30/2015  . Long term current use of opiate analgesic 04/30/2015  . Long term prescription opiate use 04/30/2015  . Opiate use (60 MME/Day) 04/30/2015  . Encounter for therapeutic drug level monitoring 04/30/2015  . Cancer-related pain 04/30/2015  . Compression fracture of thoracic vertebra (HCC) (T6, T9, T10, and T12) 04/30/2015  . Weakness generalized 04/30/2015  . No appetite 04/30/2015  . Generalized weakness 04/22/2015  . Recurrent right pleural effusion 04/08/2015  . Hospital discharge follow-up 04/03/2015  . Recurrent pneumonia   . RUQ abdominal pain   . Sepsis ([place]) 03/15/2015  . CAP (community acquired pneumonia) 03/15/2015  . Pyrexia   . Coarse tremors 03/14/2015  . Insomnia 03/14/2015  . Poor mobility 03/14/2015  . HCAP (healthcare-associated pneumonia) 03/05/2015  . 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 GI bleed 03/02/2015  . Anemia   . Aspiration pneumonitis ([place]) 02/27/2015  . Small bowel obstruction ([place]) 02/27/2015  .  Hyperglycemia 02/27/2015  . Chronic pain syndrome 02/27/2015  . COPD (chronic obstructive pulmonary disease) ([place]) 02/26/2015  . Fibromyalgia 12/12/2014  . B12 deficiency 09/28/2014  . Folate deficiency 09/28/2014  . Macrocytosis 09/21/2014  . Depression   . Hypokalemia 07/20/2014  . Anal cancer ([place]) 06/27/2014  . Anal fissure 06/27/2014  . Anxiety 06/27/2014  . Airway hyperreactivity 06/27/2014  . H/O manic depressive disorder 06/27/2014  . CAFL (chronic airflow limitation) ([place]) 06/27/2014  . Clinical depression  06/27/2014  . Deep vein thrombosis ([place]) 06/27/2014  . External hemorrhoid 06/27/2014  . Myalgia and myositis 06/27/2014  . GERD (gastroesophageal reflux disease) 06/27/2014  . HTN (hypertension) 06/27/2014  . HLD (hyperlipidemia) 06/27/2014  . Adult hypothyroidism 06/27/2014  . Mass of perianal area 06/27/2014  . 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 Ulcerated hemorrhoid 06/27/2014  . Severe obstructive sleep apnea 06/27/2014  . Squamous cell cancer, anus (HCC)   . Rectal ulcer 05/17/2014    Past Surgical History  Procedure Laterality Date  . Foot surgery Right   . Tubal ligation    . Eye surgery Bilateral   . Mouth surgery  2002  . Rectal biopsy N/A 05/08/2014    Procedure: BIOPSY RECTAL;  Surgeon: Marlyce Huge, MD;  Location: [facility] ORS;  Service: General;  Laterality: N/A;  . Evaluation under anesthesia with hemorrhoidectomy N/A 05/08/2014    Procedure: EXAM UNDER ANESTHESIA WITH HEMORRHOIDECTOMY;  Surgeon: Marlyce Huge, MD;  Location: [facility] ORS;  Service: General;  Laterality: N/A;  . Portacath placement N/A 05/20/2014    Procedure: INSERTION PORT-A-CATH;  Surgeon: Florene Glen, MD;  Location: [facility] ORS;  Service: General;  Laterality: N/A;  . Laparoscopic diverted colostomy N/A 05/26/2014    Procedure: LAPAROSCOPIC DIVERTED COLOSTOMY;  Surgeon: Marlyce Huge, MD;  Location: [facility] ORS;  Service: General;  Laterality: N/A;  . Peripheral vascular catheterization Left 10/16/2014    Procedure: Upper Extremity Venography with thrombectomy, port removal;  Surgeon: Algernon Huxley, MD;  Location: [place] CV LAB;  Service: Cardiovascular;  Laterality: Left;  . Peripheral vascular catheterization  10/16/2014    Procedure: Upper Extremity Intervention;  Surgeon: Algernon Huxley, MD;  Location: [place] CV LAB;  Service: Cardiovascular;;  . Video assisted thoracoscopy (vats)/thorocotomy Right 04/10/2015    Procedure: PRE-OP BRONCHOSCOPY, VIDEO ASSISTED THORACOSCOPY  RIGHT WITH PLEURAL BIOPSIES, & PLEURAL DESIS;  Surgeon: Nestor Lewandowsky, MD;  Location: [facility] ORS;  Service: Thoracic;  Laterality: Right;  . Esophagogastroduodenoscopy N/A 04/19/2015    Procedure: ESOPHAGOGASTRODUODENOSCOPY (EGD);  Surgeon: Hulen Luster, MD;  Location[facility] ENDOSCOPY;  Service: Endoscopy;  Laterality: N/A;  . Colonoscopy N/A 04/19/2015    Procedure: COLONOSCOPY;  Surgeon: Hulen Luster, MD;  Location: [facility] ENDOSCOPY;  Service: Endoscopy;  Laterality: N/A;  . Peripheral vascular catheterization N/A 05/14/2015    Procedure: Porta Cath Insertion;  Surgeon: Algernon Huxley, MD;  Location: [place] CV LAB;  Service: Cardiovascular;  Laterality: N/A;    Current Outpatient Rx  Name  Route  Sig  Dispense  Refill  . albuterol (PROVENTIL) (2.5 MG/3ML) 0.083% nebulizer solution   Nebulization   Take 3 mLs (2.5 mg total) by nebulization every 4 (four) hours. Patient taking differently: Take 2.5 mg by nebulization every 4 (four) hours as needed for wheezing or shortness of breath.    360 vial   12   . apixaban (ELIQUIS) 2.5 MG TABS tablet   Oral   Take 1 tablet (2.5 mg total) by mouth 2 (two) times daily.  60 tablet   3     Pt has hx of clot after portacath being put in. Sh ...   . ARIPiprazole (ABILIFY) 2 MG tablet   Oral   Take 1 tablet (2 mg total) by mouth daily.   30 tablet   4   . aspirin EC 81 MG EC tablet   Oral   Take 1 tablet (81 mg total) by mouth daily.   30 tablet   1   . budesonide-formoterol (SYMBICORT) 160-4.5 MCG/ACT inhaler   Inhalation   Inhale 2 puffs into the lungs 2 (two) times daily.   1 Inhaler   12   . cholecalciferol 1000 units tablet   Oral   Take 1 tablet (1,000 Units total) by mouth daily.   30 tablet   0   . clonazePAM (KLONOPIN) 1 MG tablet   Oral   Take 1 tablet (1 mg total) by mouth 3 (three) times daily as needed for anxiety.   90 tablet   4   . docusate sodium (COLACE) 100 MG capsule   Oral   Take 100 mg by mouth 2 (two) times  daily.         . DULoxetine (CYMBALTA) 60 MG capsule   Oral   Take 60 mg by mouth every morning.          [place] Kitchen ENSURE (ENSURE)   Oral   Take 237 mLs by mouth 2 (two) times daily between meals. Reported on 05/14/2015         . escitalopram (LEXAPRO) 20 MG tablet   Oral   Take 1 tablet (20 mg total) by mouth daily.   30 tablet   4     HOLD UNTIL PATIENT CALLS FOR REFILL   . ferrous sulfate 325 (65 FE) MG tablet   Oral   Take 1 tablet (325 mg total) by mouth 2 (two) times daily with a meal.   60 tablet   0   . folic acid (FOLVITE) 1 MG tablet   Oral   Take 1 tablet (1 mg total) by mouth daily.   30 tablet   3   . LYRICA 200 MG capsule   Oral   Take 200 mg by mouth as needed. Reported on 05/15/2015           Dispense as written.   . nicotine (NICODERM CQ - DOSED IN MG/24 HOURS) 21 mg/24hr patch   Transdermal   Place 1 patch (21 mg total) onto the skin daily.   28 patch   0   . nystatin (MYCOSTATIN) 100000 UNIT/ML suspension   Oral   Take 5 mLs (500,000 Units total) by mouth 4 (four) times daily.   60 mL   2   . ondansetron (ZOFRAN) 4 MG tablet   Oral   Take 1 tablet (4 mg total) by mouth every 6 (six) hours as needed for nausea.   20 tablet   0   . oxyCODONE-acetaminophen (PERCOCET) 10-325 MG tablet   Oral   Take 1 tablet by mouth every 6 (six) hours as needed. Reported on 04/30/2015   112 tablet   0   . pantoprazole (PROTONIX) 40 MG tablet   Oral   Take 40 mg by mouth 2 (two) times daily.          . polyethylene glycol (MIRALAX / GLYCOLAX) packet   Oral   Take 17 g by mouth daily.   14 each   0   . tiotropium (  SPIRIVA) 18 MCG inhalation capsule   Inhalation   Place 1 capsule (18 mcg total) into inhaler and inhale daily.   30 capsule   12   . VENTOLIN HFA 108 (90 Base) MCG/ACT inhaler      Reported on 05/14/2015           Dispense as written.   . vitamin B-12 (CYANOCOBALAMIN) 1000 MCG tablet   Oral   Take 1,000 mcg by mouth daily.  Reported on 05/14/2015         . zolpidem (AMBIEN) 10 MG tablet   Oral   Take 1 tablet (10 mg total) by mouth at bedtime as needed for sleep.   30 tablet   4   . protein supplement (RESOURCE BENEPROTEIN) POWD   Oral   Take 1 scoop by mouth 2 (two) times daily between meals.         . senna-docusate (SENOKOT-S) 8.6-50 MG tablet   Oral   Take 1 tablet by mouth at bedtime as needed for mild constipation.   30 tablet   5     Allergies Fish allergy and Sulfa antibiotics  Family History  Problem Relation Age of Onset  . Cancer Maternal Aunt   . Cancer Paternal Uncle   . Diabetes Mother   . Thyroid disease Mother   . Kidney failure Mother   . Hypertension Mother   . Depression Mother   . Mental illness Son   . Aneurysm Maternal Grandmother   . Thyroid disease Maternal Grandmother   . Stroke Maternal Grandfather   . Hypertension Maternal Grandfather   . Diabetes Maternal Grandfather   . Heart disease Maternal Grandfather     MI  . Nephrolithiasis Daughter     Social History Social History  Substance Use Topics  . Smoking status: Former Smoker -- 0.10 packs/day for 44 years    Types: Cigarettes    Start date: 10/04/1970    Quit date: 03/26/2015  . Smokeless tobacco: Never Used  . Alcohol Use: No     Comment: 1 drink every 2-3 times/year    Review of Systems  Constitutional: Negative for fever. Eyes: Negative for redness ENT: Negative for Throat swelling Cardiovascular: As above Respiratory: As above Gastrointestinal: Negative for abdominal pain Genitourinary: Negative for dysuria. Musculoskeletal: Negative for back pain. Skin: Negative for rash. Neurological: Negative for headache Psychiatric: no anxiety    ____________________________________________   PHYSICAL EXAM:  VITAL SIGNS: ED Triage Vitals  Enc Vitals Group     BP 05/24/15 0950 145/82 mmHg     Pulse Rate 05/24/15 0950 103     Resp 05/24/15 0950 26     Temp 05/24/15 0950 97.7 F (36.5  C)     Temp Source 05/24/15 0950 Oral     SpO2 05/24/15 0950 100 %     Weight --      Height --      Head Cir --      Peak Flow --      Pain Score 05/24/15 0950 10     Pain Loc --      Pain Edu? --      Excl. in [place]? --     Constitutional: Alert and oriented. Well appearing and in no distress.  Eyes: Conjunctivae are normal. No erythema or injection ENT   Head: Normocephalic and atraumatic.   Mouth/Throat: Mucous membranes are moist. Cardiovascular: Tachycardia, regular rhythm. Normal and symmetric distal pulses are present in the upper extremities. Respiratory: Poor air  movement , scattered wheezes Gastrointestinal: Soft and non-tender in all quadrants. No distention. There is no CVA tenderness. Genitourinary: deferred Musculoskeletal: Nontender with normal range of motion in all extremities. No lower extremity tenderness nor edema. Neurologic:  Normal speech and language. No gross focal neurologic deficits are appreciated. Skin:  Skin is warm, dry and intact. No rash noted. Psychiatric: Mood and affect are normal. Patient exhibits appropriate insight and judgment.  ____________________________________________    LABS (pertinent positives/negatives)  Labs Reviewed  CBC WITH DIFFERENTIAL/PLATELET - Abnormal; Notable for the following:    RBC 2.60 (*)    Hemoglobin 7.3 (*)    HCT 23.1 (*)    MCHC 31.6 (*)    RDW 19.7 (*)    Neutro Abs 7.6 (*)    Eosinophils Absolute 1.4 (*)    All other components within normal limits  COMPREHENSIVE METABOLIC PANEL - Abnormal; Notable for the following:    Sodium 133 (*)    Chloride 99 (*)    Glucose, Bld 106 (*)    Total Protein 5.7 (*)    Albumin 2.0 (*)    AST 48 (*)    Anion gap 3 (*)    All other components within normal limits  BRAIN NATRIURETIC PEPTIDE - Abnormal; Notable for the following:    B Natriuretic Peptide 125.0 (*)    All other components within normal limits  TROPONIN I     ____________________________________________   EKG    ____________________________________________    RADIOLOGY  Chest x-ray with right-sided pleural effusion  ____________________________________________   PROCEDURES  Procedure(s) performed: none  Critical Care performed: none  ____________________________________________   INITIAL IMPRESSION / ASSESSMENT AND PLAN / ED COURSE  Pertinent labs & imaging results that were available during my care of the patient were reviewed by me and considered in my medical decision making (see chart for details).  Patient presents with increased shortness of breath. Poor airflow bilaterally, suspect COPD exacerbation we will treat with DuoNeb started steroids and reevaluate.   ----------------------------------------- 1:06 PM on 05/24/2015 -----------------------------------------  Patient improved but continues to wheeze diffusely. We'll admit for further treatment and evaluation  ____________________________________________   FINAL CLINICAL IMPRESSION(S) / ED DIAGNOSES  Final diagnoses:  COPD exacerbation ([place])          Lavonia Drafts, MD 05/24/15 [phone]

## 2015-05-24 NOTE — H&P (Signed)
[facility] [place] NAME: [name]    MR#:  [id number]  DATE OF BIRTH:  [birth year]  DATE OF ADMISSION:  05/24/2015  PRIMARY CARE PHYSICIAN: [name], NP   REQUESTING/REFERRING PHYSICIAN: dr Corky Downs  CHIEF COMPLAINT:   Increasing shortness of breath and wheezing since last night. HISTORY OF PRESENT ILLNESS:  [name]  is a 60 y.o. female with a known history of Anal cancer status post colostomy undergoing chemotherapy at the [facility], COPD on home oxygen 2 L, schizophrenia, history of PTSD, comes to the emergency room from the cancer center after she was seen there for routine visit and found to be very short of breath with sats dropping into 84% being on oxygen. Patient complained of wheezing last night. She did not take her nebulizer treatment at home. She is being admitted for further evaluation of management of her acute on chronic hypoxic respiratory failure secondary to COPD exacerbation. Denies any fever or any productive phlegm. Patient received a dose of IV Solu-Medrol and couple rounds of nebulization. Currently she is not wheezing.  PAST MEDICAL HISTORY:   Past Medical History  Diagnosis Date  . Schizophrenia ([place])   . Asthma   . GERD (gastroesophageal reflux disease)   . Anxiety   . Depression   . Bipolar disorder ([place])   . COPD (chronic obstructive pulmonary disease) ([place])   . Occasional tremors     right hand  . PTSD (post-traumatic stress disorder)   . Shortness of breath dyspnea   . Fibromyalgia   . DVT (deep venous thrombosis) ([place]) 2011    RUE  . Thyroid nodule   . DDD (degenerative disc disease), lumbar   . Spinal stenosis   . Peripheral neuropathy ([place])   . Rotator cuff tear     right  . Pneumonia 2011  . Hypothyroidism     no meds currently  . Anemia     during pregnancy only  . Hypertension     Off meds x 15 years-well controlled now per pt  . Severe obstructive sleep  apnea 06/27/2014  . DVT of upper extremity (deep vein thrombosis) ([place]) 10/14/2014  . Squamous cell cancer, anus ([facility])   . Lung cancer ([place])   . Hemorrhoid 06/27/2014  . SBO (small bowel obstruction) ([place])     PAST SURGICAL HISTOIRY:   Past Surgical History  Procedure Laterality Date  . Foot surgery Right   . Tubal ligation    . Eye surgery Bilateral   . Mouth surgery  2002  . Rectal biopsy N/A 05/08/2014    Procedure: BIOPSY RECTAL;  Surgeon: Marlyce Huge, MD;  Location: [facility] ORS;  Service: General;  Laterality: N/A;  . Evaluation under anesthesia with hemorrhoidectomy N/A 05/08/2014    Procedure: EXAM UNDER ANESTHESIA WITH HEMORRHOIDECTOMY;  Surgeon: Marlyce Huge, MD;  Location: [facility] ORS;  Service: General;  Laterality: N/A;  . Portacath placement N/A 05/20/2014    Procedure: INSERTION PORT-A-CATH;  Surgeon: Florene Glen, MD;  Location: [facility] ORS;  Service: General;  Laterality: N/A;  . Laparoscopic diverted colostomy N/A 05/26/2014    Procedure: LAPAROSCOPIC DIVERTED COLOSTOMY;  Surgeon: Marlyce Huge, MD;  Location: [facility] ORS;  Service: General;  Laterality: N/A;  . Peripheral vascular catheterization Left 10/16/2014    Procedure: Upper Extremity Venography with thrombectomy, port removal;  Surgeon: Algernon Huxley, MD;  Location: [facility];  Service: Cardiovascular;  Laterality: Left;  . Peripheral vascular catheterization  10/16/2014    Procedure: Upper Extremity Intervention;  Surgeon: Algernon Huxley, MD;  Location: [facility];  Service: Cardiovascular;;  . Video assisted thoracoscopy (vats)/thorocotomy Right 04/10/2015    Procedure: PRE-OP BRONCHOSCOPY, VIDEO ASSISTED THORACOSCOPY RIGHT WITH PLEURAL BIOPSIES, & PLEURAL DESIS;  Surgeon: Nestor Lewandowsky, MD;  Location: [facility] ORS;  Service: Thoracic;  Laterality: Right;  . Esophagogastroduodenoscopy N/A 04/19/2015    Procedure: ESOPHAGOGASTRODUODENOSCOPY (EGD);  Surgeon: Hulen Luster, MD;  Location: [facility]  ENDOSCOPY;  Service: Endoscopy;  Laterality: N/A;  . Colonoscopy N/A 04/19/2015    Procedure: COLONOSCOPY;  Surgeon: Hulen Luster, MD;  Location: [facility] ENDOSCOPY;  Service: Endoscopy;  Laterality: N/A;  . Peripheral vascular catheterization N/A 05/14/2015    Procedure: Porta Cath Insertion;  Surgeon: Algernon Huxley, MD;  Location: [facility];  Service: Cardiovascular;  Laterality: N/A;    SOCIAL HISTORY:   Social History  Substance Use Topics  . Smoking status: Former Smoker -- 0.10 packs/day for 44 years    Types: Cigarettes    Start date: 10/04/1970    Quit date: 03/26/2015  . Smokeless tobacco: Never Used  . Alcohol Use: No     Comment: 1 drink every 2-3 times/year    FAMILY HISTORY:   Family History  Problem Relation Age of Onset  . Cancer Maternal Aunt   . Cancer Paternal Uncle   . Diabetes Mother   . Thyroid disease Mother   . Kidney failure Mother   . Hypertension Mother   . Depression Mother   . Mental illness Son   . Aneurysm Maternal Grandmother   . Thyroid disease Maternal Grandmother   . Stroke Maternal Grandfather   . Hypertension Maternal Grandfather   . Diabetes Maternal Grandfather   . Heart disease Maternal Grandfather     MI  . Nephrolithiasis Daughter     DRUG ALLERGIES:   Allergies  Allergen Reactions  . Fish Allergy Anaphylaxis    Patient allergic to perch only. She can eat other fish.  . Sulfa Antibiotics Hives    REVIEW OF SYSTEMS:  Review of Systems  Constitutional: Negative for fever, chills and weight loss.  HENT: Negative for ear discharge, ear pain and nosebleeds.   Eyes: Negative for blurred vision, pain and discharge.  Respiratory: Positive for cough, shortness of breath and wheezing. Negative for sputum production and stridor.   Cardiovascular: Negative for chest pain, palpitations, orthopnea and PND.  Gastrointestinal: Negative for nausea, vomiting, abdominal pain and diarrhea.  Genitourinary: Negative for urgency and  frequency.  Musculoskeletal: Negative for back pain and joint pain.  Neurological: Positive for weakness. Negative for sensory change, speech change and focal weakness.  Psychiatric/Behavioral: Negative for depression and hallucinations. The patient is not nervous/anxious.   All other systems reviewed and are negative.    MEDICATIONS AT HOME:   Prior to Admission medications   Medication Sig Start Date End Date Taking? Authorizing Provider  albuterol (PROVENTIL) (2.5 MG/3ML) 0.083% nebulizer solution Take 3 mLs (2.5 mg total) by nebulization every 4 (four) hours. Patient taking differently: Take 2.5 mg by nebulization every 4 (four) hours as needed for wheezing or shortness of breath.  02/27/15  Yes Theodoro Grist, MD  apixaban (ELIQUIS) 2.5 MG TABS tablet Take 1 tablet (2.5 mg total) by mouth 2 (two) times daily. 05/15/15  Yes Lequita Asal, MD  ARIPiprazole (ABILIFY) 2 MG tablet Take 1 tablet (2 mg total) by mouth daily. 01/31/15  Yes Marjie Skiff, MD  aspirin  EC 81 MG EC tablet Take 1 tablet (81 mg total) by mouth daily. 04/17/15  Yes Demetrios Loll, MD  budesonide-formoterol [facility]) 160-4.5 MCG/ACT inhaler Inhale 2 puffs into the lungs 2 (two) times daily. 01/30/15  Yes [name], NP  cholecalciferol 1000 units tablet Take 1 tablet (1,000 Units total) by mouth daily. 04/19/15  Yes Hulen Luster, MD  clonazePAM (KLONOPIN) 1 MG tablet Take 1 tablet (1 mg total) by mouth 3 (three) times daily as needed for anxiety. 01/31/15  Yes Marjie Skiff, MD  docusate sodium (COLACE) 100 MG capsule Take 100 mg by mouth 2 (two) times daily.   Yes Historical Provider, MD  DULoxetine (CYMBALTA) 60 MG capsule Take 60 mg by mouth every morning.    Yes Historical Provider, MD  ENSURE (ENSURE) Take 237 mLs by mouth 2 (two) times daily between meals. Reported on 05/14/2015   Yes Historical Provider, MD  escitalopram (LEXAPRO) 20 MG tablet Take 1 tablet (20 mg total) by mouth daily. 01/31/15  Yes Marjie Skiff,  MD  ferrous sulfate 325 (65 FE) MG tablet Take 1 tablet (325 mg total) by mouth 2 (two) times daily with a meal. 04/17/15  Yes Demetrios Loll, MD  folic acid (FOLVITE) 1 MG tablet Take 1 tablet (1 mg total) by mouth daily. 03/12/15  Yes Lequita Asal, MD  LYRICA 200 MG capsule Take 200 mg by mouth as needed. Reported on 05/15/2015 05/10/15  Yes Historical Provider, MD  nicotine (NICODERM CQ - DOSED IN MG/24 HOURS) 21 mg/24hr patch Place 1 patch (21 mg total) onto the skin daily. 03/02/15  Yes Theodoro Grist, MD  nystatin (MYCOSTATIN) 100000 UNIT/ML suspension Take 5 mLs (500,000 Units total) by mouth 4 (four) times daily. 03/09/15  Yes [name], NP  ondansetron (ZOFRAN) 4 MG tablet Take 1 tablet (4 mg total) by mouth every 6 (six) hours as needed for nausea. 04/19/15  Yes Hulen Luster, MD  oxyCODONE-acetaminophen (PERCOCET) 10-325 MG tablet Take 1 tablet by mouth every 6 (six) hours as needed. Reported on 04/30/2015 05/22/15  Yes [name], NP  pantoprazole (PROTONIX) 40 MG tablet Take 40 mg by mouth 2 (two) times daily.    Yes Historical Provider, MD  polyethylene glycol (MIRALAX / GLYCOLAX) packet Take 17 g by mouth daily. 03/02/15  Yes Theodoro Grist, MD  tiotropium (SPIRIVA) 18 MCG inhalation capsule Place 1 capsule (18 mcg total) into inhaler and inhale daily. 02/26/15  Yes [name], NP  VENTOLIN HFA 108 [phone] Base) MCG/ACT inhaler Reported on 05/14/2015 03/26/15  Yes Historical Provider, MD  vitamin B-12 (CYANOCOBALAMIN) 1000 MCG tablet Take 1,000 mcg by mouth daily. Reported on 05/14/2015   Yes Historical Provider, MD  zolpidem (AMBIEN) 10 MG tablet Take 1 tablet (10 mg total) by mouth at bedtime as needed for sleep. 01/31/15  Yes Marjie Skiff, MD  protein supplement (RESOURCE BENEPROTEIN) POWD Take 1 scoop by mouth 2 (two) times daily between meals.    Historical Provider, MD      VITAL SIGNS:  Blood pressure 118/61, pulse 97, temperature 97.7 F (36.5 C), temperature source Oral, resp. rate 18,  SpO2 100 %.  PHYSICAL EXAMINATION:  GENERAL:  60 y.o.-year-old patient lying in the bed with no acute distress.  EYES: Pupils equal, round, reactive to light and accommodation. No scleral icterus. Extraocular muscles intact.  HEENT: Head atraumatic, normocephalic. Oropharynx and nasopharynx clear.  NECK:  Supple, no jugular venous distention. No thyroid enlargement, no tenderness.  LUNGS: decreased  breath sounds bilaterally, no wheezing, rales,rhonchi or crepitation. No use of accessory muscles of respiration.  CARDIOVASCULAR: S1, S2 normal. No murmurs, rubs, or gallops.  ABDOMEN: Soft, nontender, nondistended. Bowel sounds present. No organomegaly or mass.  EXTREMITIES: No pedal edema, cyanosis, or clubbing.  NEUROLOGIC: Cranial nerves II through XII are intact. Muscle strength 5/5 in all extremities. Sensation intact. Gait not checked.  PSYCHIATRIC: The patient is alert and oriented x 3.  SKIN: No obvious rash, lesion, or ulcer.   LABORATORY PANEL:   CBC  Recent Labs Lab 05/24/15 1029  WBC 11.0  HGB 7.3*  HCT 23.1*  PLT 191   ------------------------------------------------------------------------------------------------------------------  Chemistries   Recent Labs Lab 05/24/15 0827 05/24/15 1029  NA 132* 133*  K 3.3* 3.6  CL 99* 99*  CO2 29 31  GLUCOSE 145* 106*  BUN 12 12  CREATININE 0.83 0.82  CALCIUM 9.6 9.7  MG 1.7  --   AST 50* 48*  ALT 13* 14  ALKPHOS 144* 119  BILITOT 0.6 0.5   ------------------------------------------------------------------------------------------------------------------  Cardiac Enzymes  Recent Labs Lab 05/24/15 1029  TROPONINI <0.03   ------------------------------------------------------------------------------------------------------------------  RADIOLOGY:  Dg Chest 2 View  05/24/2015  CLINICAL DATA:  60 year old female with a history of shortness of breath. Initiating chemotherapy for new lung cancer. EXAM: CHEST  2  VIEW COMPARISON:  Plain film 04/17/2015, 04/15/2015, PET-CT 05/03/2015 FINDINGS: Cardiomediastinal silhouette unchanged. Blunting of the right costophrenic angle with partial obscuration the right hemidiaphragm, similar to comparison. Pleural parenchymal thickening along the right chest/ lung interface. Interval placement of right IJ approach port catheter, with the tip appearing to terminate at the superior vena cava. No left-sided airspace disease. Contiguous lower thoracic compression fractures of T9 and T10, as well as T12, unchanged in configuration from comparison studies. No new acute fracture. IMPRESSION: Similar appearance of right-sided pleural effusion with associated consolidation/atelectasis, in this patient with known malignant pleural disease. No new left-sided airspace disease. Interval placement of right port catheter via IJ approach. Unchanged configuration of lower thoracic compression fractures. Signed, Dulcy Fanny. Earleen Newport, DO [facility] Electronically Signed   By: Corrie Mckusick D.O.   On: 05/24/2015 10:19    EKG:    IMPRESSION AND PLAN:   [name]  is a 60 y.o. female with a known history of Anal cancer status post colostomy undergoing chemotherapy at the [facility], COPD on home oxygen 2 L, schizophrenia, history of PTSD, comes to the emergency room from the cancer center after she was seen there for routine visit and found to be very short of breath with sats dropping into 84% being on oxygen. Patient complained of wheezing last night.  1. Acute on chronic hypoxic respiratory failure due to COPD exacerbation. -Admit to medical floor -IV Solu-Medrol, nebs every 6 hourly and when necessary, oral inhalers, -No indication for antibiotics at present. Patient does not exhibit signs and symptoms of respiratory infection. -Physical therapy  2. History of anal cancer -Patient is getting chemotherapy at the cancer  center  3. Chronic anticoagulation due to Port-A-Cath clotting -Continue elquis 2.5 mg twice a day  4. History of schizophrenia/PTSD/anxiety Continue psych meds  5. DVT prophylaxis Patient already on oral anticoagulation.  6 anemia of chronic disease Hemoglobin is stable at 7.3. No history of any active bleeding. Continue to monitor. Transfuse as needed. Patient's hemoglobin is anywhere from 7.4- 8.5  All the records are reviewed and case discussed with ED provider. Management plans discussed with the patient  and they are in agreement.  CODE STATUS: full  TOTAL TIME TAKING CARE OF THIS PATIENT: 28mnutes.    Marissa Weaver M.D on 05/24/2015 at 1:23 PM  Between 7am to 6pm - Pager - [phone]  After 6pm go to [email or web address] - password [email or web address]  Office  [phone] CC: Primary care physician; CKathrine Haddock NP

## 2015-05-24 NOTE — Addendum Note (Signed)
Addended by: Luella Cook on: 05/24/2015 06:29 PM   Modules accepted: Orders

## 2015-05-24 NOTE — ED Notes (Signed)
Pt presents from cancer center with increased SOB since last night. Pt has lung cancer and has been taking oxycodone for chronic back pain r/t the lung cancer. Pt states she took oxycodone at 530 this morning for a pain level of 3/10, and pain is currently 8/10. Pt states it hurts to move around. Pt alert & oriented and able to answer questions appropriately. NAD noted.

## 2015-05-24 NOTE — Progress Notes (Signed)
[facility] day: 05/24/2015   Chief Complaint: [name] is a 60 y.o. 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  HPI:  The patient was last seen in the medical oncology clinic on 05/092017.  At that time, decision was made to postpone chemotherapy secondary to a painful port.  Port was placed the day before.  A prescription for Eliquis 2.5 mg po BID was written for port-a-cath prophylaxis given her prior issues with thrombosis associated with her previous central line.  She started Eliquis that day.  Bilateral audiogram on 05/09/2015 revealed mild to moderate sensorineural hearing loss at baseline.  Hearing aids were felt to be an option.  The patient states that she was doing well until last night.  She describes waking up to go to the bathroom and being extremely short of breath.  She had to stop and rest.  She denied any pleuritic chest pain, change in chronic cough, or hemoptysis.  She then describes waking up because of severe back pain (new for 2-3 weeks).  She took her pain medication, but again had to rest because of fatigue and shortness of breath.  She denies any fever.   Past Medical History  Diagnosis Date  . Schizophrenia ([place])   . Asthma   . GERD (gastroesophageal reflux disease)   . Anxiety   . Depression   . Bipolar disorder ([place])   . COPD (chronic obstructive pulmonary disease) ([place])   . Occasional tremors     right hand  . PTSD (post-traumatic stress disorder)   . Shortness of breath dyspnea   . Fibromyalgia   . DVT (deep venous thrombosis) ([place]) 2011    RUE  . Thyroid nodule   . DDD (degenerative disc disease), lumbar   . Spinal stenosis   . Peripheral neuropathy ([place])   . Rotator cuff tear     right  . Pneumonia 2011  . Hypothyroidism     no meds currently  . Anemia     during pregnancy only  . Hypertension    Off meds x 15 years-well controlled now per pt  . Severe obstructive sleep apnea 06/27/2014  . DVT of upper extremity (deep vein thrombosis) ([place]) 10/14/2014  . Squamous cell cancer, anus (HCC)   . Lung cancer ([place])   . Hemorrhoid 06/27/2014  . SBO (small bowel obstruction) [facility])     Past Surgical History  Procedure Laterality Date  . Foot surgery Right   . Tubal ligation    . Eye surgery Bilateral   . Mouth surgery  2002  . Rectal biopsy N/A 05/08/2014    Procedure: BIOPSY RECTAL;  Surgeon: Marlyce Huge, MD;  Location: [facility] ORS;  Service: General;  Laterality: N/A;  . Evaluation under anesthesia with hemorrhoidectomy N/A 05/08/2014    Procedure: EXAM UNDER ANESTHESIA WITH HEMORRHOIDECTOMY;  Surgeon: Marlyce Huge, MD;  Location: [facility] ORS;  Service: General;  Laterality: N/A;  . Portacath placement N/A 05/20/2014    Procedure: INSERTION PORT-A-CATH;  Surgeon: Florene Glen, MD;  Location: [facility] ORS;  Service: General;  Laterality: N/A;  . Laparoscopic diverted colostomy N/A 05/26/2014    Procedure: LAPAROSCOPIC DIVERTED COLOSTOMY;  Surgeon: Marlyce Huge, MD;  Location: [facility] ORS;  Service: General;  Laterality: N/A;  . Peripheral vascular catheterization Left 10/16/2014    Procedure: Upper Extremity Venography with thrombectomy, port removal;  Surgeon: Algernon Huxley, MD;  Location: [place] [facility];  Service: Cardiovascular;  Laterality: Left;  . Peripheral vascular catheterization  10/16/2014    Procedure: Upper Extremity Intervention;  Surgeon: Algernon Huxley, MD;  Location: [place] [facility];  Service: Cardiovascular;;  . Video assisted thoracoscopy (vats)/thorocotomy Right 04/10/2015    Procedure: PRE-OP BRONCHOSCOPY, VIDEO ASSISTED THORACOSCOPY RIGHT WITH PLEURAL BIOPSIES, & PLEURAL DESIS;  Surgeon: Nestor Lewandowsky, MD;  Location: [facility] ORS;  Service: Thoracic;  Laterality: Right;  . Esophagogastroduodenoscopy N/A 04/19/2015    Procedure: ESOPHAGOGASTRODUODENOSCOPY  (EGD);  Surgeon: Hulen Luster, MD;  Location: [facility] ENDOSCOPY;  Service: Endoscopy;  Laterality: N/A;  . Colonoscopy N/A 04/19/2015    Procedure: COLONOSCOPY;  Surgeon: Hulen Luster, MD;  Location: [facility] ENDOSCOPY;  Service: Endoscopy;  Laterality: N/A;  . Peripheral vascular catheterization N/A 05/14/2015    Procedure: Porta Cath Insertion;  Surgeon: Algernon Huxley, MD;  Location: [place] [facility];  Service: Cardiovascular;  Laterality: N/A;    Family History  Problem Relation Age of Onset  . Cancer Maternal Aunt   . Cancer Paternal Uncle   . Diabetes Mother   . Thyroid disease Mother   . Kidney failure Mother   . Hypertension Mother   . Depression Mother   . Mental illness Son   . Aneurysm Maternal Grandmother   . Thyroid disease Maternal Grandmother   . Stroke Maternal Grandfather   . Hypertension Maternal Grandfather   . Diabetes Maternal Grandfather   . Heart disease Maternal Grandfather     MI  . Nephrolithiasis Daughter     Social History:  reports that she quit smoking about 8 weeks ago. Her smoking use included Cigarettes. She started smoking about 44 years ago. She has a 4.4 pack-year smoking history. She has never used smokeless tobacco. She reports that she does not drink alcohol or use illicit drugs.  She was discharged from [facility] on [facility]10[facility]2015.  She is living with her best friend and her family.  The patient is alone today.  Allergies:  Allergies  Allergen Reactions  . Fish Allergy Anaphylaxis    Patient allergic to perch only. She can eat other fish.  . Sulfa Antibiotics Hives    Current Medications: Current Outpatient Prescriptions  Medication Sig Dispense Refill  . albuterol (PROVENTIL) (2.5 MG/3ML) 0.083% nebulizer solution Take 3 mLs (2.5 mg total) by nebulization every 4 (four) hours. (Patient taking differently: Take 2.5 mg by nebulization every 4 (four) hours as needed for wheezing or shortness of breath. ) 360 vial 12  . apixaban (ELIQUIS) 2.5 MG TABS tablet  Take 1 tablet (2.5 mg total) by mouth 2 (two) times daily. 60 tablet 3  . ARIPiprazole (ABILIFY) 2 MG tablet Take 1 tablet (2 mg total) by mouth daily. 30 tablet 4  . aspirin EC 81 MG EC tablet Take 1 tablet (81 mg total) by mouth daily. 30 tablet 1  . baclofen (LIORESAL) 10 MG tablet Take 10 mg by mouth 3 (three) times daily. Reported on 05/15/2015    . budesonide-formoterol (SYMBICORT) 160-4.5 MCG/ACT inhaler Inhale 2 puffs into the lungs 2 (two) times daily. 1 Inhaler 12  . cholecalciferol 1000 units tablet Take 1 tablet (1,000 Units total) by mouth daily. 30 tablet 0  . clonazePAM (KLONOPIN) 1 MG tablet Take 1 tablet (1 mg total) by mouth 3 (three) times daily as needed for anxiety. 90 tablet 4  . DULoxetine (CYMBALTA) 60 MG capsule Take 60 mg by mouth every morning.     Marland Kitchen  ENSURE (ENSURE) Take 237 mLs by mouth 2 (two) times daily between meals. Reported on 05/14/2015    . ferrous sulfate 325 (65 FE) MG tablet Take 1 tablet (325 mg total) by mouth 2 (two) times daily with a meal. 60 tablet 0  . folic acid (FOLVITE) 1 MG tablet Take 1 tablet (1 mg total) by mouth daily. 30 tablet 3  . LYRICA 200 MG capsule Take 200 mg by mouth as needed. Reported on 05/15/2015    . Magnesium 250 MG TABS Take 250 mg by mouth daily. Reported on 05/15/2015    . nicotine (NICODERM CQ - DOSED IN MG/24 HOURS) 21 mg/24hr patch Place 1 patch (21 mg total) onto the skin daily. 28 patch 0  . nystatin (MYCOSTATIN) 100000 UNIT/ML suspension Take 5 mLs (500,000 Units total) by mouth 4 (four) times daily. 60 mL 2  . ondansetron (ZOFRAN) 4 MG tablet Take 1 tablet (4 mg total) by mouth every 6 (six) hours as needed for nausea. 20 tablet 0  . oxyCODONE-acetaminophen (PERCOCET) 10-325 MG tablet Take 1 tablet by mouth every 6 (six) hours as needed. Reported on 04/30/2015 112 tablet 0  . pantoprazole (PROTONIX) 40 MG tablet Take 40 mg by mouth 2 (two) times daily.     . polyethylene glycol (MIRALAX / GLYCOLAX) packet Take 17 g by mouth  daily. 14 each 0  . protein supplement (RESOURCE BENEPROTEIN) POWD Take 1 scoop by mouth 2 (two) times daily between meals.    . senna-docusate (SENOKOT-S) 8.6-50 MG tablet Take 1 tablet by mouth at bedtime as needed for mild constipation. 30 tablet 5  . tiotropium (SPIRIVA) 18 MCG inhalation capsule Place 1 capsule (18 mcg total) into inhaler and inhale daily. 30 capsule 12  . VENTOLIN HFA 108 (90 Base) MCG/ACT inhaler Reported on 05/14/2015    . vitamin B-12 (CYANOCOBALAMIN) 1000 MCG tablet Take 1,000 mcg by mouth daily. Reported on 05/14/2015    . zolpidem (AMBIEN) 10 MG tablet Take 1 tablet (10 mg total) by mouth at bedtime as needed for sleep. 30 tablet 4  . escitalopram (LEXAPRO) 20 MG tablet Take 1 tablet (20 mg total) by mouth daily. 30 tablet 4   No current facility-administered medications for this visit.   Facility-Administered Medications Ordered in Other Visits  Medication Dose Route Frequency Provider Last Rate Last Dose  . heparin lock flush 100 unit/mL  500 Units Intravenous Once Lequita Asal, MD      . heparin lock flush 100 unit/mL  500 Units Intracatheter Once PRN Lequita Asal, MD      . sodium chloride 0.9 % injection 10 mL  10 mL Intravenous PRN Lequita Asal, MD      . sodium chloride flush (NS) 0.9 % injection 10 mL  10 mL Intracatheter PRN Lequita Asal, MD   10 mL at 05/24/15 7829    Review of Systems:  GENERAL:  Fatigue, new since this AM.  No fevers or sweats.  Weight up 2 pounds. PERFORMANCE STATUS (ECOG):  3 HEENT:  No visual changes, runny nose, mouth sores or sore throat. Lungs:  Shortness of breath, acute.  Rare cough.  No hemoptysis. Cardiac:  No chest pain, palpitations, orthopnea, or PND. GI:  Colostomy.  Denies abdominal pain.  Appetite good.  No nausea, constipation, melena or hematochezia. GU:  No urgency, frequency, dysuria, or hematuria. Musculoskeletal:  Back pain with 3 compression fractures (old) with new acute pain today.  No  joint pain.  No  muscle tenderness. Extremities:  Swelling in lower legs (no change). Skin:  No rashes or ulcers. Neuro:  No headache, numbness or weakness, balance or coordination issues. Endocrine:  No diabetes, thyroid issues, hot flashes or night sweats. Psych:  No mood changes or depression.  Pain:  Back pain (new).  Abdominal discomfort. Review of systems:  All other systems reviewed and found to be negative.   Physical Exam: Blood pressure 134/84, pulse 114, temperature 96 F (35.6 C), resp. rate 24, height _0  (1.651 m), weight 192 lb 9.2 oz (87.35 kg), SpO2 92 %.  GENERAL:   Discheveled appearing woman sitting with head down in a wheelchair in the exam room under blankets in no acute distress.  MENTAL STATUS:  Alert and oriented to person, place and time. HEAD:  Shoulder length gray hair.  Normocephalic, atraumatic, face symmetric, no Cushingoid features. EYES:  Blue eyes.  Pupils equal round and reactive to light and accomodation.  No conjunctivitis or scleral icterus. ENT:  Wilmore in place.  Oropharynx clear without lesion.  Tongue normal. Mucous membranes moist.  RESPIRATORY:  Poor respiratory excursion.  Decreased breath sounds LLL and fine crackles RLL.  No wheezes or rhonchi. CARDIOVASCULAR:  Tachycardic.  Regular rate and rhythm without murmur, rub or gallop. CHEST:  Right sided port tender to touch.  No erythema. ABDOMEN:  Ostomy.  Stool liquid brown (no blood).  Soft, non-tender with active bowel sounds and no hepatosplenomegaly.  No masses. BACK:  Pain mid thoracic spine on palpation. LYMPH NODES:  No palpable cervical, supraclavicular, axillary or inguinal adenopathy. SKIN:  No other rashes, ulcers or lesions. EXTREMITIES: Bilateral 1+ lower extremity edema (improved).  No skin discoloration or tenderness.  No palpable cords. NEUROLOGICAL: Unremarkable. PSYCH:  Appropriate.   Infusion on 05/24/2015  Component Date Value Ref Range Status  . Magnesium 05/24/2015 1.7  1.7  - 2.4 mg/dL Final  . WBC 05/24/2015 12.6* 3.6 - 11.0 K/uL Final  . RBC 05/24/2015 2.62* 3.80 - 5.20 MIL/uL Final  . Hemoglobin 05/24/2015 7.4* 12.0 - 16.0 g/dL Final  . HCT 05/24/2015 23.1* 35.0 - 47.0 % Final  . MCV 05/24/2015 88.5  80.0 - 100.0 fL Final  . MCH 05/24/2015 28.3  26.0 - 34.0 pg Final  . MCHC 05/24/2015 32.0  32.0 - 36.0 g/dL Final  . RDW 05/24/2015 19.7* 11.5 - 14.5 % Final  . Platelets 05/24/2015 209  150 - 440 K/uL Final  . Neutrophils Relative % 05/24/2015 67   Final  . Neutro Abs 05/24/2015 8.4* 1.4 - 6.5 K/uL Final  . Lymphocytes Relative 05/24/2015 12   Final  . Lymphs Abs 05/24/2015 1.5  1.0 - 3.6 K/uL Final  . Monocytes Relative 05/24/2015 6   Final  . Monocytes Absolute 05/24/2015 0.8  0.2 - 0.9 K/uL Final  . Eosinophils Relative 05/24/2015 14   Final  . Eosinophils Absolute 05/24/2015 1.7* 0 - 0.7 K/uL Final  . Basophils Relative 05/24/2015 1   Final  . Basophils Absolute 05/24/2015 0.1  0 - 0.1 K/uL Final  . Sodium 05/24/2015 132* 135 - 145 mmol/L Final  . Potassium 05/24/2015 3.3* 3.5 - 5.1 mmol/L Final  . Chloride 05/24/2015 99* 101 - 111 mmol/L Final  . CO2 05/24/2015 29  22 - 32 mmol/L Final  . Glucose, Bld 05/24/2015 145* 65 - 99 mg/dL Final  . BUN 05/24/2015 12  6 - 20 mg/dL Final  . Creatinine, Ser 05/24/2015 0.83  0.44 - 1.00 mg/dL Final  . Calcium  05/24/2015 9.6  8.9 - 10.3 mg/dL Final  . Total Protein 05/24/2015 6.0* 6.5 - 8.1 g/dL Final  . Albumin 05/24/2015 2.0* 3.5 - 5.0 g/dL Final  . AST 05/24/2015 50* 15 - 41 U/L Final  . ALT 05/24/2015 13* 14 - 54 U/L Final  . Alkaline Phosphatase 05/24/2015 144* 38 - 126 U/L Final  . Total Bilirubin 05/24/2015 0.6  0.3 - 1.2 mg/dL Final  . GFR calc non Af Amer 05/24/2015 >60  >60 mL/min Final  . GFR calc Af Amer 05/24/2015 >60  >60 mL/min Final   Comment: (NOTE) The eGFR has been calculated using the CKD EPI equation. This calculation has not been validated in all clinical situations. eGFR's  persistently <60 mL/min signify possible Chronic Kidney Disease.   . Anion gap 05/24/2015 4* 5 - 15 Final    Assessment:  [name] is a 60 y.o. female with stage IV anal cancer.  She presented in 05/2014 with a 6 month history of an 80 pound weight and a 2 month history of fecal incontinence and progressive pain.  Chest, abdomen, and pelvic CT scan on 05/19/2014 revealed no evidence of metastatic disease.  There was a 4 mm RUL pulmonary nodule.  There were borderline (13 mm) enlarged iliac nodes.  There was a 4.7 x 4.1 x 6.0 cm lower rectal and anal mass consistent with anal cancer.  She underwent diverting colostomy on 05/26/2014.    She received concurrent chemotherapy (5FU and mitomycin-C) and radiation (06/29/2014 - 07/31/2014).  She missed 3 days of radiation (07/04-07/06/2014).  She has been off radiation since 08/23/2014 secondary to perirectal breakdown.  She received hyperbaric oxygen.  She was seen by Dr. Phoebe Perch on 01/02/2015.  Closure of her ostomy was not recommended for at least 6 months.    She has macrocytic RBC indices.  Work-up on 09/28/2014 revealed a B12 deficiency (169) and folate deficiency (5.2).  TSH was normal.  She receives B12 (last 05/01/2015).  She is supposed to take folic acid (initially not taking secondary to costs).  She developed a left upper extremity DVT on 10/14/2014.  She underwent thrombolysis, thrombectomy, and port removal on 10/16/2014.  She was on Coumadin.  Left upper extremity duplex on 03/02/2015 revealed resolved thrombus in the right internal jugular, subclavian and axillary veins.  There was a small amount of residual thrombus in the proximal right brachial vein over a short segment (very low thrombus burden).  Coumadin was discontinued during her 03/05/2015 admission.  She was admitted to [facility] from 02/26/2015 - 03/03/2015 with a COPD exacerbation, RLL pneumonia, and small bowel obstruction.  She was treated with antibiotics and  steroids.  Her small bowel obstruction resolved with conservative management.    She was admitted to [facility] from 03/05/2015 - 03/07/2015 with health care associated pneumonia.  She was treated with Zosyn and Levaquin and discharged on Levaquin and prednisone.  She was discharged on oxygen 2 liters/min vi nasal cannula to maintain sats > 90%.    She was admitted to [facility] from 03/15/2015 - 03/18/2015 with recurrent pneumonia. She presented with fever and dyspnea. She was treated with antibiotics and discharged on Augmentin. CXR on 04/08/2015 revealed a stable large right sided pleural effusion with compressive atelectasis.  She was admitted to [facility] from 04/08/2015 - 04/17/2015 with progressive shortness of breath and a large right sided pleural effusion. Right pleural biopsy and talc pleurodesis on 04/10/2015 revealed fragments of metastatic squamous cell carcinoma.  She was admitted to [facility]  from 04/17/2015 - 04/21/2015 with GI bleed and melena.  EGD and colonoscopy on 04/19/2015 revealed only diverticulosis in the sigmoid colon.  The etiology of her GI bleeding was unclear.  She was discharged on ciprofloxacin and Flagyl.  She also had an E coli and Klebsiella UTI.  She was admitted to [facility] from 04/22/2015 - 04/24/2015 with abdominal pain, generalized weakness and hypokalemia.  She was discharged to a skilled nursing facility.  PET scan on 05/03/2015 revealed no evidence of hypermetabolic local tumor recurrence in the anus.  There was new hypermetabolic right paratracheal and subcarinal mediastinal lymphadenopathy.  There was extensive hypermetabolism throughout the right pleural space.  There was a new mildly metabolic 0.7 cm nodule associated with the minor fissure.  There was no definite hypermetabolic metastatic disease in the abdomen or pelvis.  There was focal hypermetabolism in the left abdomen at the site of nondilated proximal small bowel loop with no associated mass or small bowel wall  thickening.    Bilateral audiogram on 05/09/2015 revealed mild to moderate sensorineural hearing loss at baseline.   She has anemia likely secondary to recent GI bleed and chronic disease.  Labs on 05/01/2015 revealed a normal ferritin (184), iron saturation (11%), and folate (27.0).  Retic was 4.2%.  Symptomatically, she has acutely developed shortness of breath and new back pain worrisome for pulmonary embolism.  Exam reveals poorly respiratory excursion, decreased breath sounds in LLL, and fine crackles in the RLL.  She has stable 1+ bilateral lower extremity edema (left > right).  Plan: 1.  Labs today:  CBC with diff, CMP, Mg. 2.  Review audiogram. 3.  Discuss acute change in respiratory status.  Possible pulmonary embolism.  Discuss evaluation in the emergency room.  Report called. 4.  Postpone chemotherapy today. 5.  RTC next week for MD reassessment, labs (CBC with diff, CMP, Mg) and cycle #1 cisplatin and 5-fluorouracil.   Lequita Asal, MD  05/24/2015, 9:27 AM

## 2015-05-25 LAB — BASIC METABOLIC PANEL
Anion gap: 5 (ref 5–15)
BUN: 15 mg/dL (ref 6–20)
CALCIUM: 10.1 mg/dL (ref 8.9–10.3)
CHLORIDE: 97 mmol/L — AB (ref 101–111)
CO2: 33 mmol/L — AB (ref 22–32)
CREATININE: 0.86 mg/dL (ref 0.44–1.00)
GFR calc non Af Amer: >60 mL/min (ref >60)
Glucose, Bld: 129 mg/dL — ABNORMAL HIGH (ref 65–99)
Potassium: 4.1 mmol/L (ref 3.5–5.1)
SODIUM: 135 mmol/L (ref 135–145)

## 2015-05-25 LAB — MAGNESIUM: MAGNESIUM: 1.7 mg/dL (ref 1.7–2.4)

## 2015-05-25 LAB — TSH: TSH: 1.154 u[IU]/mL (ref 0.350–4.500)

## 2015-05-25 MED ORDER — CALCIUM CARBONATE ANTACID 500 MG PO CHEW
1.0000 | CHEWABLE_TABLET | ORAL | Status: DC | PRN
Start: 2015-05-25 — End: 2015-05-25
  Administered 2015-05-25: 01:00:00 200 mg via ORAL
  Filled 2015-05-25: qty 1

## 2015-05-25 MED ORDER — HEPARIN SOD (PORK) LOCK FLUSH 100 UNIT/ML IV SOLN
500.0000 [IU] | Freq: Once | INTRAVENOUS | Status: AC
  Administered 2015-05-25: 500 [IU] via INTRAVENOUS
  Filled 2015-05-25: qty 5

## 2015-05-25 MED ORDER — PREDNISONE 10 MG (21) PO TBPK: ORAL_TABLET | ORAL | Status: DC

## 2015-05-25 MED ORDER — TRAZODONE HCL 50 MG PO TABS
50.0000 mg | ORAL_TABLET | Freq: Every day | ORAL | Status: DC
  Administered 2015-05-25: 50 mg via ORAL
  Filled 2015-05-25: qty 1

## 2015-05-25 NOTE — Care Management Note (Signed)
Case Management Note  Patient Details  Name: [name] MRN: [id number] Date of Birth: 18-Jun-1955  Subjective/Objective: 60  Y.o. F admitted for Acute on Chronic Respiratory Failure. Pt has anal cancer and is currently receiving Chemotherapy. Referred after routine visit at Cancer center for increased wheezing and O2 sats 84%. MD reports return to  baseline now.  Anticipate discharge home today when daughter can transport by family auto. .   Action/Plan: No further CM needs but will be available should additional discharge needs arise.                    Expected Discharge Date:                  Expected Discharge Plan:  Home/Self Care  In-House Referral:     Discharge planning Services  CM Consult  Post Acute Care Choice:    Choice offered to:     DME Arranged:  Oxygen (Home Oxgen prior to admission) DME Agency:     HH Arranged:    [place] Agency:     Status of Service:  Completed, signed off  Medicare Important Message Given:    Date Medicare IM Given:    Medicare IM give by:    Date Additional Medicare IM Given:    Additional Medicare Important Message give by:     If discussed at [place] of Stay Meetings, dates discussed:    Additional Comments:  Delrae Sawyers, RN 05/25/2015, 11:48 AM

## 2015-05-25 NOTE — Progress Notes (Signed)
Discharge instructions given and went over with patient at bedside. Prescription given. Medications returned from pharmacy. All questions answered. Patient discharged home with daughter via wheelchair by nursing staff. Madlyn Frankel, RN

## 2015-05-29 ENCOUNTER — Other Ambulatory Visit: Payer: Self-pay | Admitting: *Deleted

## 2015-05-29 NOTE — Patient Outreach (Signed)
Attempt made to contact pt, f/u on recent ED visit (5/18-5/19) as well as ongoing transition of care.   HIPPA compliant voice message left with contact number.  If no response, will try again.  Also plan to f/u with [facility], providing home health services to pt.    [name]   [facility]  [phone]

## 2015-05-30 ENCOUNTER — Telehealth: Payer: Self-pay

## 2015-05-30 ENCOUNTER — Ambulatory Visit: Payer: Commercial Managed Care - HMO | Admitting: Pain Medicine

## 2015-05-30 NOTE — Telephone Encounter (Signed)
Gerald Stabs from [facility] and wanted to see about getting a verbal order to send a nurse out to help with the patient. Gerald Stabs stated that the patient has just recently moved in with her daughter and stated the daughter needs to be trained to clean and care for the patient's colostomy bag.

## 2015-05-31 ENCOUNTER — Inpatient Hospital Stay: Payer: Commercial Managed Care - HMO

## 2015-05-31 ENCOUNTER — Inpatient Hospital Stay: Payer: Commercial Managed Care - HMO | Admitting: Hematology and Oncology

## 2015-05-31 ENCOUNTER — Other Ambulatory Visit: Payer: Self-pay | Admitting: Hematology and Oncology

## 2015-05-31 NOTE — Telephone Encounter (Signed)
Called and let [name] know that [place] gave the OK for a nurse.

## 2015-05-31 NOTE — Telephone Encounter (Signed)
Yes.  OK for verbal

## 2015-06-01 ENCOUNTER — Telehealth: Payer: Self-pay

## 2015-06-01 NOTE — Telephone Encounter (Signed)
RX sent for the portable oxygen tank and incontinence pads.

## 2015-06-01 NOTE — Telephone Encounter (Signed)
OK. Thank you

## 2015-06-01 NOTE — Telephone Encounter (Signed)
Rowe Clack OT with [facility] is calling to request two verbal orders  1:  Medical Social Worker for personal care  2: Short Term Order for Home Health Aid until long term can be arranged

## 2015-06-01 NOTE — Telephone Encounter (Signed)
Patient called back and stated she needs a prescription for a portable oxygen tank. [facility]- fax number is [phone]. Patient states she would also like incontinence pads.

## 2015-06-01 NOTE — Telephone Encounter (Signed)
We got a fax from the call center stating that the patient wanted [name] to call her. I tried to call the patient but she did not answer and I left her a voicemail asking for her to please return my call.

## 2015-06-03 ENCOUNTER — Other Ambulatory Visit: Payer: Self-pay | Admitting: Hematology and Oncology

## 2015-06-05 ENCOUNTER — Telehealth: Payer: Self-pay | Admitting: Unknown Physician Specialty

## 2015-06-05 ENCOUNTER — Telehealth: Payer: Self-pay | Admitting: *Deleted

## 2015-06-05 ENCOUNTER — Other Ambulatory Visit: Payer: Self-pay | Admitting: *Deleted

## 2015-06-05 NOTE — Telephone Encounter (Signed)
Called pharmacy about patient's pain medication. According to our chart, it was last written 05/22/15 and pharmacy stated that it was filled 05/26/15.

## 2015-06-05 NOTE — Telephone Encounter (Signed)
Patient called to cancel appt for today due to transportation. She has a hospital f/u on 06/19/15 @ 1pm. Patient stated that she is out of pain medication due to she needs to talk 1 1/2 to 2 pills for pain and needs refill on it. Thanks.

## 2015-06-05 NOTE — Telephone Encounter (Signed)
RN spoke with Dr. Rogue Bussing. He will not prescribe narcotics on this patient. 1. He has not yet evaluated the patient. 2). It appears that the pcp-Dr. Julian Hy has last prescribed narcotics on 05/22/15.   Please inform PCP office that patient has also contacted the cancer center for narcotics.

## 2015-06-05 NOTE — Telephone Encounter (Signed)
Called and left patient a voicemail asking for her to please return my call.  

## 2015-06-05 NOTE — Telephone Encounter (Signed)
I cannot prescribe this medicine due to a note from the cancer center that she is getting it off the street.  She filled #112 on 5/20.  That will need to last 1 months

## 2015-06-05 NOTE — Telephone Encounter (Signed)
Routing to provider  

## 2015-06-05 NOTE — Telephone Encounter (Signed)
Called to report that she is in severe pain and needs medicine for it. Reports that she has been buying medicine off the street, but it is really not helping her. She got med on 5/16 form PCP per chart and she has a call into her PCP for same request this morning.

## 2015-06-05 NOTE — Telephone Encounter (Signed)
She will need a drug screen for any further prescriptions.  I cannot fill any medications without a drug screen.  Plus, one prescription will have to last her a month

## 2015-06-05 NOTE — Telephone Encounter (Addendum)
She has an appt 6/1 with Dr Rogue Bussing as Dr Mike Gip is on vacation. She had been referred to pain clinic previously, but I do not seen any notes that she ever went.

## 2015-06-05 NOTE — Telephone Encounter (Signed)
Called and spoke to patient. She states that she spilled half of the medication that she had in to toilet. She stated that she went to take one, had the bottle in her hand, it slipped and they spilled in the toilet (she stated she had just finished brushing her teeth so that's why she was in the bathroom.) I mentioned to the patient about her telling the cancer center about getting some medication off of the street and they patient stated that she did do this because she was completely out. Patient got upset on the phone, started crying and stating that her pain is very bad. She states she cries a lot because of the pain.

## 2015-06-05 NOTE — Telephone Encounter (Signed)
Thanks your for the information

## 2015-06-05 NOTE — Patient Outreach (Signed)
Late entry-  Received an in basket after hours on 5/26 that pt called Nurse call center trying to return call.   Attempt made today 5/30 to f/u with pt as part of ongoing transition of care.   HIPPA compliant voice message left with contact number.  If no response, will try again.    [name]   [facility]  [phone]

## 2015-06-06 ENCOUNTER — Other Ambulatory Visit: Payer: Self-pay | Admitting: Hematology and Oncology

## 2015-06-06 ENCOUNTER — Inpatient Hospital Stay: Payer: Commercial Managed Care - HMO | Admitting: Unknown Physician Specialty

## 2015-06-06 ENCOUNTER — Telehealth: Payer: Self-pay

## 2015-06-06 NOTE — Telephone Encounter (Signed)
Called and left patient a voicemail asking for her to please return my call.  

## 2015-06-06 NOTE — Telephone Encounter (Signed)
Verbal order given.  Thanks

## 2015-06-06 NOTE — Telephone Encounter (Signed)
[name] called and wanted to do verbal orders for incontinence supplies and help with bath 2 times per week.

## 2015-06-06 NOTE — Telephone Encounter (Signed)
Called and let Sherri know that Malachy Mood gave an OK for those orders.

## 2015-06-07 ENCOUNTER — Inpatient Hospital Stay: Payer: Commercial Managed Care - HMO | Admitting: Internal Medicine

## 2015-06-07 ENCOUNTER — Other Ambulatory Visit: Payer: Self-pay | Admitting: *Deleted

## 2015-06-07 ENCOUNTER — Inpatient Hospital Stay: Payer: Commercial Managed Care - HMO

## 2015-06-07 ENCOUNTER — Other Ambulatory Visit: Payer: Self-pay | Admitting: Hematology and Oncology

## 2015-06-07 ENCOUNTER — Encounter: Payer: Self-pay | Admitting: *Deleted

## 2015-06-07 NOTE — Progress Notes (Signed)
Pt did not keep her scheduled lab/md/chemotherapy appointment today.  Msg sent to scheduling to r/s this apt next wk with Dr. Mike Gip.

## 2015-06-07 NOTE — Discharge Summary (Signed)
[facility] NAME: [name]    MR#:  [id number]  [place]:  October 08, 1955  DATE OF ADMISSION:  05/24/2015 ADMITTING PHYSICIAN: Fritzi Mandes, MD  DATE OF DISCHARGE: 05/25/2015 12:41 PM  PRIMARY CARE PHYSICIAN: [name], NP    ADMISSION DIAGNOSIS:  COPD exacerbation ([place]) [J44.1]  DISCHARGE DIAGNOSIS:  Active Problems:   Respiratory failure, acute and chronic ([place])   SECONDARY DIAGNOSIS:   Past Medical History  Diagnosis Date  . Schizophrenia ([place])   . Asthma   . GERD (gastroesophageal reflux disease)   . Anxiety   . Depression   . Bipolar disorder ([place])   . COPD (chronic obstructive pulmonary disease) ([place])   . Occasional tremors     right hand  . PTSD (post-traumatic stress disorder)   . Shortness of breath dyspnea   . Fibromyalgia   . DVT (deep venous thrombosis) ([place]) 2011    RUE  . Thyroid nodule   . DDD (degenerative disc disease), lumbar   . Spinal stenosis   . Peripheral neuropathy ([place])   . Rotator cuff tear     right  . Pneumonia 2011  . Hypothyroidism     no meds currently  . Anemia     during pregnancy only  . Hypertension     Off meds x 15 years-well controlled now per pt  . Severe obstructive sleep apnea 06/27/2014  . DVT of upper extremity (deep vein thrombosis) ([place]) 10/14/2014  . Squamous cell cancer, anus (H[facility])   . Lung cancer ([place])   . Hemorrhoid 06/27/2014  . SBO (small bowel obstruction) [facility])     HOSPITAL COURSE:  [name] is a 60 y.o. female with a known history of Anal cancer status post colostomy undergoing chemotherapy at the [facility], COPD on home oxygen 2 L, schizophrenia, history of PTSD, comes to the emergency room from the cancer center after she was seen there for routine visit and found to be very short of breath with sats dropping into 84% being on oxygen. Patient complained of wheezing last night.  1. Acute on chronic hypoxic respiratory  failure due to COPD exacerbation. -IV Solu-Medrol, nebs every 6 hourly and when necessary, oral inhalers, -No indication for antibiotics at present. Patient does not exhibit signs and symptoms of respiratory infection. -Physical therapy - improved next day.  2. History of anal cancer -Patient is getting chemotherapy at the cancer center  3. Chronic anticoagulation due to Port-A-Cath clotting -Continue elquis 2.5 mg twice a day  4. History of schizophrenia/PTSD/anxiety Continue psych meds  5. DVT prophylaxis Patient already on oral anticoagulation.  6 anemia of chronic disease Hemoglobin is stable at 7.3. No history of any active bleeding. Continue to monitor. Transfuse as needed. Patient's hemoglobin is anywhere from 7.4- 8.5  DISCHARGE CONDITIONS:   Stable.  CONSULTS OBTAINED:     DRUG ALLERGIES:   Allergies  Allergen Reactions  . Fish Allergy Anaphylaxis    Patient allergic to perch only. She can eat other fish.  . Sulfa Antibiotics Hives    DISCHARGE MEDICATIONS:   Discharge Medication List as of 05/25/2015 11:44 AM    START taking these medications   Details  predniSONE (STERAPRED UNI-PAK 21 TAB) 10 MG (21) TBPK tablet Take 6 tabs first day, 5 tab on day 2, then 4 on day 3rd, 3 tabs on day 4th , 2 tab on day 5th, and 1 tab on 6th day., Print  CONTINUE these medications which have NOT CHANGED   Details  albuterol (PROVENTIL) (2.5 MG/3ML) 0.083% nebulizer solution Take 3 mLs (2.5 mg total) by nebulization every 4 (four) hours., Starting 02/27/2015, Until Discontinued, Normal    apixaban (ELIQUIS) 2.5 MG TABS tablet Take 1 tablet (2.5 mg total) by mouth 2 (two) times daily., Starting 05/15/2015, Until Discontinued, Normal    ARIPiprazole (ABILIFY) 2 MG tablet Take 1 tablet (2 mg total) by mouth daily., Starting 01/31/2015, Until Discontinued, Normal    aspirin EC 81 MG EC tablet Take 1 tablet (81 mg total) by mouth daily., Starting 04/17/2015, Until Discontinued,  Print    budesonide-formoterol (SYMBICORT) 160-4.5 MCG/ACT inhaler Inhale 2 puffs into the lungs 2 (two) times daily., Starting 01/30/2015, Until Discontinued, Normal    cholecalciferol 1000 units tablet Take 1 tablet (1,000 Units total) by mouth daily., Starting 04/19/2015, Until Discontinued, Normal    clonazePAM (KLONOPIN) 1 MG tablet Take 1 tablet (1 mg total) by mouth 3 (three) times daily as needed for anxiety., Starting 01/31/2015, Until Discontinued, Print    docusate sodium (COLACE) 100 MG capsule Take 100 mg by mouth 2 (two) times daily., Until Discontinued, Historical Med    DULoxetine (CYMBALTA) 60 MG capsule Take 60 mg by mouth every morning. , Until Discontinued, Historical Med    ENSURE (ENSURE) Take 237 mLs by mouth 2 (two) times daily between meals. Reported on 05/14/2015, Until Discontinued, Historical Med    escitalopram (LEXAPRO) 20 MG tablet Take 1 tablet (20 mg total) by mouth daily., Starting 01/31/2015, Until Discontinued, Normal    ferrous sulfate 325 (65 FE) MG tablet Take 1 tablet (325 mg total) by mouth 2 (two) times daily with a meal., Starting 04/17/2015, Until Discontinued, Print    folic acid (FOLVITE) 1 MG tablet Take 1 tablet (1 mg total) by mouth daily., Starting 03/12/2015, Until Discontinued, Normal    LYRICA 200 MG capsule Take 200 mg by mouth as needed. Reported on 05/15/2015, Starting 05/10/2015, Until Discontinued, Historical Med    nicotine (NICODERM CQ - DOSED IN MG/24 HOURS) 21 mg/24hr patch Place 1 patch (21 mg total) onto the skin daily., Starting 03/02/2015, Until Discontinued, Normal    nystatin (MYCOSTATIN) 100000 UNIT/ML suspension Take 5 mLs (500,000 Units total) by mouth 4 (four) times daily., Starting 03/09/2015, Until Discontinued, Normal    ondansetron (ZOFRAN) 4 MG tablet Take 1 tablet (4 mg total) by mouth every 6 (six) hours as needed for nausea., Starting 04/19/2015, Until Discontinued, Normal    oxyCODONE-acetaminophen (PERCOCET) 10-325 MG tablet  Take 1 tablet by mouth every 6 (six) hours as needed. Reported on 04/30/2015, Starting 05/22/2015, Until Discontinued, Print    pantoprazole (PROTONIX) 40 MG tablet Take 40 mg by mouth 2 (two) times daily. , Until Discontinued, Historical Med    polyethylene glycol (MIRALAX / GLYCOLAX) packet Take 17 g by mouth daily., Starting 03/02/2015, Until Discontinued, Normal    tiotropium (SPIRIVA) 18 MCG inhalation capsule Place 1 capsule (18 mcg total) into inhaler and inhale daily., Starting 02/26/2015, Until Discontinued, Normal    VENTOLIN HFA 108 (90 Base) MCG/ACT inhaler Reported on 05/14/2015, Starting 03/26/2015, Until Discontinued, Historical Med    vitamin B-12 (CYANOCOBALAMIN) 1000 MCG tablet Take 1,000 mcg by mouth daily. Reported on 05/14/2015, Until Discontinued, Historical Med    zolpidem (AMBIEN) 10 MG tablet Take 1 tablet (10 mg total) by mouth at bedtime as needed for sleep., Starting 01/31/2015, Until Discontinued, Print    protein supplement (RESOURCE BENEPROTEIN) POWD Take 1 scoop  by mouth 2 (two) times daily between meals., Until Discontinued, Historical Med         DISCHARGE INSTRUCTIONS:    Follow with PMD in 1-2 weeks.  If you experience worsening of your admission symptoms, develop shortness of breath, life threatening emergency, suicidal or homicidal thoughts you must seek medical attention immediately by calling 911 or calling your MD immediately  if symptoms less severe.  You Must read complete instructions/literature along with all the possible adverse reactions/side effects for all the Medicines you take and that have been prescribed to you. Take any new Medicines after you have completely understood and accept all the possible adverse reactions/side effects.   Please note  You were cared for by a hospitalist during your hospital stay. If you have any questions about your discharge medications or the care you received while you were in the hospital after you are  discharged, you can call the unit and asked to speak with the hospitalist on call if the hospitalist that took care of you is not available. Once you are discharged, your primary care physician will handle any further medical issues. Please note that NO REFILLS for any discharge medications will be authorized once you are discharged, as it is imperative that you return to your primary care physician (or establish a relationship with a primary care physician if you do not have one) for your aftercare needs so that they can reassess your need for medications and monitor your lab values.    Today   CHIEF COMPLAINT:   Chief Complaint  Patient presents with  . Shortness of Breath    HISTORY OF PRESENT ILLNESS:  [name]  is a 60 y.o. female with a known history of Anal cancer status post colostomy undergoing chemotherapy at the [facility], COPD on home oxygen 2 L, schizophrenia, history of PTSD, comes to the emergency room from the cancer center after she was seen there for routine visit and found to be very short of breath with sats dropping into 84% being on oxygen. Patient complained of wheezing last night. She did not take her nebulizer treatment at home. She is being admitted for further evaluation of management of her acute on chronic hypoxic respiratory failure secondary to COPD exacerbation. Denies any fever or any productive phlegm. Patient received a dose of IV Solu-Medrol and couple rounds of nebulization. Currently she is not wheezing.   VITAL SIGNS:  Blood pressure 122/64, pulse 92, temperature 97.6 F (36.4 C), temperature source Oral, resp. rate 20, height '5\' 6"'$  (1.676 m), weight 85.775 kg (189 lb 1.6 oz), SpO2 93 %.  I/O:  No intake or output data in the 24 hours ending 06/07/15 1129  PHYSICAL EXAMINATION:   GENERAL: 60 y.o.-year-old patient lying in the bed with no acute distress.  EYES: Pupils equal, round, reactive to light and accommodation. No scleral  icterus. Extraocular muscles intact.  HEENT: Head atraumatic, normocephalic. Oropharynx and nasopharynx clear.  NECK: Supple, no jugular venous distention. No thyroid enlargement, no tenderness.  LUNGS: decreased breath sounds bilaterally, no wheezing, rales,rhonchi or crepitation. No use of accessory muscles of respiration.  CARDIOVASCULAR: S1, S2 normal. No murmurs, rubs, or gallops.  ABDOMEN: Soft, nontender, nondistended. Bowel sounds present. No organomegaly or mass.  EXTREMITIES: No pedal edema, cyanosis, or clubbing.  NEUROLOGIC: Cranial nerves II through XII are intact. Muscle strength 5/5 in all extremities. Sensation intact. Gait not checked.  PSYCHIATRIC: The patient is alert and oriented x 3.  SKIN: No obvious rash,  lesion, or ulcer.   DATA REVIEW:   CBC No results for input(s): WBC, HGB, HCT, PLT in the last 168 hours.  Chemistries  No results for input(s): NA, K, CL, CO2, GLUCOSE, BUN, CREATININE, CALCIUM, MG, AST, ALT, ALKPHOS, BILITOT in the last 168 hours.  Invalid input(s): GFRCGP  Cardiac Enzymes No results for input(s): TROPONINI in the last 168 hours.  Microbiology Results  Results for orders placed or performed during the hospital encounter of 04/17/15  Urine culture     Status: Abnormal   Collection Time: 04/17/15 10:42 PM  Result Value Ref Range Status   Specimen Description URINE, CLEAN CATCH  Final   Special Requests NONE  Final   Culture (A)  Final    >=100,000 COLONIES/mL KLEBSIELLA PNEUMONIAE >=100,000 COLONIES/mL ESCHERICHIA COLI    Report Status 04/21/2015 FINAL  Final   Organism ID, Bacteria ESCHERICHIA COLI (A)  Final   Organism ID, Bacteria KLEBSIELLA PNEUMONIAE (A)  Final      Susceptibility   Escherichia coli - MIC*    AMPICILLIN >=32 RESISTANT Resistant     CEFAZOLIN <=4 SENSITIVE Sensitive     CEFTRIAXONE <=1 SENSITIVE Sensitive     CIPROFLOXACIN 1 SENSITIVE Sensitive     GENTAMICIN <=1 SENSITIVE Sensitive     IMIPENEM  <=0.25 SENSITIVE Sensitive     NITROFURANTOIN <=16 SENSITIVE Sensitive     TRIMETH/SULFA <=20 SENSITIVE Sensitive     AMPICILLIN/SULBACTAM >=32 RESISTANT Resistant     PIP/TAZO <=4 SENSITIVE Sensitive     Extended ESBL NEGATIVE Sensitive     * >=100,000 COLONIES/mL ESCHERICHIA COLI   Klebsiella pneumoniae - MIC*    Extended ESBL NEGATIVE Sensitive     AMPICILLIN/SULBACTAM Value in next row Intermediate      INTERMEDIATE16    PIP/TAZO Value in next row Sensitive      SENSITIVE16    CEFTRIAXONE Value in next row Sensitive      SENSITIVE<=1    IMIPENEM Value in next row Sensitive      SENSITIVE1    GENTAMICIN Value in next row Sensitive      SENSITIVE<=1    CIPROFLOXACIN Value in next row Sensitive      SENSITIVE<=0.25    NITROFURANTOIN Value in next row Intermediate      INTERMEDIATE64    TRIMETH/SULFA Value in next row Sensitive      SENSITIVE<=20    * >=100,000 COLONIES/mL KLEBSIELLA PNEUMONIAE   *Note: Due to a large number of results and/or encounters for the requested time period, some results have not been displayed. A complete set of results can be found in Results Review.    RADIOLOGY:  No results found.  EKG:   Orders placed or performed during the hospital encounter of 05/24/15  . ED EKG  . ED EKG   *Note: Due to a large number of results and/or encounters for the requested time period, some results have not been displayed. A complete set of results can be found in Results Review.      Management plans discussed with the patient, family and they are in agreement.  CODE STATUS:  Code Status History    Date Active Date Inactive Code Status Order ID Comments User Context   05/24/2015  2:39 PM 05/25/2015  3:49 PM Full Code [id number]  Fritzi Mandes, MD ED   04/23/2015 12:40 AM 04/24/2015  7:02 PM Full Code [id number]  Max Sane, MD Inpatient   04/18/2015  2:43 AM 04/21/2015  9:09 PM Full Code [id number]  [name], MD Inpatient   04/08/2015  2:01 PM 04/17/2015  7:16  PM Full Code [id number]  Idelle Crouch, MD ED   03/16/2015  1:41 AM 03/18/2015  6:23 PM Full Code [id number]  Lance Coon, MD ED   03/05/2015  6:17 AM 03/07/2015  8:09 PM Full Code [id number]  [name], MD Inpatient   02/26/2015  5:43 PM 03/03/2015  6:54 PM Full Code [id number]  Bettey Costa, MD Inpatient   10/14/2014 10:59 PM 10/17/2014  4:34 PM Full Code [id number]  Lytle Butte, MD ED   05/17/2014  8:38 PM 05/30/2014  4:55 PM Full Code [id number]  Fritzi Mandes, MD Inpatient    Advance Directive Documentation        Most Recent Value   Type of Advance Directive  Healthcare Power of Attorney   Pre-existing out of facility DNR order (yellow form or pink MOST form)     "MOST" Form in Place?        TOTAL TIME TAKING CARE OF THIS PATIENT: 35 minutes.    Vaughan Basta M.D on 06/07/2015 at 11:29 AM  Between 7am to 6pm - Pager - [phone]  After 6pm go to [email or web address] - password [email or web address] [facility]  Office  [phone]  CC: Primary care physician; [name], NP   Note: This dictation was prepared with Dragon dictation along with smaller phrase technology. Any transcriptional errors that result from this process are unintentional.

## 2015-06-07 NOTE — Patient Outreach (Signed)
[facility]) Care Management  06/07/2015  [name] Apr 02, 1955 [id number]   Phone call to patient to assess for social work needs post discharge from the skilled nursing facility.  HIPPA compliant voicemail message left for a return call.    [name] [facility] Care Management [phone]

## 2015-06-09 ENCOUNTER — Observation Stay
Admission: EM | Admit: 2015-06-09 | Discharge: 2015-06-11 | Disposition: A | Discharge status: Home / Self Care | Payer: Commercial Managed Care - HMO | Source: Home / Self Care | Attending: Emergency Medicine | Admitting: Emergency Medicine

## 2015-06-09 ENCOUNTER — Emergency Department: Payer: Commercial Managed Care - HMO

## 2015-06-09 DIAGNOSIS — K644 Residual hemorrhoidal skin tags: Secondary | ICD-10-CM | POA: Insufficient documentation

## 2015-06-09 DIAGNOSIS — Z7901 Long term (current) use of anticoagulants: Secondary | ICD-10-CM

## 2015-06-09 DIAGNOSIS — G894 Chronic pain syndrome: Secondary | ICD-10-CM

## 2015-06-09 DIAGNOSIS — Z8349 Family history of other endocrine, nutritional and metabolic diseases: Secondary | ICD-10-CM | POA: Insufficient documentation

## 2015-06-09 DIAGNOSIS — Y95 Nosocomial condition: Secondary | ICD-10-CM | POA: Insufficient documentation

## 2015-06-09 DIAGNOSIS — Z882 Allergy status to sulfonamides status: Secondary | ICD-10-CM

## 2015-06-09 DIAGNOSIS — Z9049 Acquired absence of other specified parts of digestive tract: Secondary | ICD-10-CM

## 2015-06-09 DIAGNOSIS — G4700 Insomnia, unspecified: Secondary | ICD-10-CM

## 2015-06-09 DIAGNOSIS — J45909 Unspecified asthma, uncomplicated: Secondary | ICD-10-CM | POA: Insufficient documentation

## 2015-06-09 DIAGNOSIS — H903 Sensorineural hearing loss, bilateral: Secondary | ICD-10-CM

## 2015-06-09 DIAGNOSIS — R531 Weakness: Secondary | ICD-10-CM

## 2015-06-09 DIAGNOSIS — Z9981 Dependence on supplemental oxygen: Secondary | ICD-10-CM

## 2015-06-09 DIAGNOSIS — J439 Emphysema, unspecified: Secondary | ICD-10-CM

## 2015-06-09 DIAGNOSIS — E039 Hypothyroidism, unspecified: Secondary | ICD-10-CM

## 2015-06-09 DIAGNOSIS — M47814 Spondylosis without myelopathy or radiculopathy, thoracic region: Secondary | ICD-10-CM

## 2015-06-09 DIAGNOSIS — R188 Other ascites: Secondary | ICD-10-CM | POA: Insufficient documentation

## 2015-06-09 DIAGNOSIS — M5136 Other intervertebral disc degeneration, lumbar region: Secondary | ICD-10-CM

## 2015-06-09 DIAGNOSIS — E46 Unspecified protein-calorie malnutrition: Secondary | ICD-10-CM | POA: Insufficient documentation

## 2015-06-09 DIAGNOSIS — F0390 Unspecified dementia without behavioral disturbance: Secondary | ICD-10-CM

## 2015-06-09 DIAGNOSIS — Z7982 Long term (current) use of aspirin: Secondary | ICD-10-CM

## 2015-06-09 DIAGNOSIS — Z833 Family history of diabetes mellitus: Secondary | ICD-10-CM

## 2015-06-09 DIAGNOSIS — E876 Hypokalemia: Secondary | ICD-10-CM | POA: Insufficient documentation

## 2015-06-09 DIAGNOSIS — C3490 Malignant neoplasm of unspecified part of unspecified bronchus or lung: Secondary | ICD-10-CM

## 2015-06-09 DIAGNOSIS — Z809 Family history of malignant neoplasm, unspecified: Secondary | ICD-10-CM | POA: Insufficient documentation

## 2015-06-09 DIAGNOSIS — D649 Anemia, unspecified: Secondary | ICD-10-CM | POA: Insufficient documentation

## 2015-06-09 DIAGNOSIS — Z85048 Personal history of other malignant neoplasm of rectum, rectosigmoid junction, and anus: Secondary | ICD-10-CM | POA: Insufficient documentation

## 2015-06-09 DIAGNOSIS — Z7951 Long term (current) use of inhaled steroids: Secondary | ICD-10-CM | POA: Insufficient documentation

## 2015-06-09 DIAGNOSIS — J449 Chronic obstructive pulmonary disease, unspecified: Secondary | ICD-10-CM | POA: Insufficient documentation

## 2015-06-09 DIAGNOSIS — J869 Pyothorax without fistula: Secondary | ICD-10-CM | POA: Diagnosis not present

## 2015-06-09 DIAGNOSIS — K7460 Unspecified cirrhosis of liver: Secondary | ICD-10-CM | POA: Insufficient documentation

## 2015-06-09 DIAGNOSIS — R5383 Other fatigue: Secondary | ICD-10-CM

## 2015-06-09 DIAGNOSIS — R0600 Dyspnea, unspecified: Secondary | ICD-10-CM | POA: Insufficient documentation

## 2015-06-09 DIAGNOSIS — F319 Bipolar disorder, unspecified: Secondary | ICD-10-CM

## 2015-06-09 DIAGNOSIS — G629 Polyneuropathy, unspecified: Secondary | ICD-10-CM

## 2015-06-09 DIAGNOSIS — K219 Gastro-esophageal reflux disease without esophagitis: Secondary | ICD-10-CM

## 2015-06-09 DIAGNOSIS — R251 Tremor, unspecified: Secondary | ICD-10-CM | POA: Insufficient documentation

## 2015-06-09 DIAGNOSIS — Z86718 Personal history of other venous thrombosis and embolism: Secondary | ICD-10-CM

## 2015-06-09 DIAGNOSIS — Z841 Family history of disorders of kidney and ureter: Secondary | ICD-10-CM

## 2015-06-09 DIAGNOSIS — R0602 Shortness of breath: Secondary | ICD-10-CM | POA: Insufficient documentation

## 2015-06-09 DIAGNOSIS — E538 Deficiency of other specified B group vitamins: Secondary | ICD-10-CM | POA: Insufficient documentation

## 2015-06-09 DIAGNOSIS — Z8249 Family history of ischemic heart disease and other diseases of the circulatory system: Secondary | ICD-10-CM | POA: Insufficient documentation

## 2015-06-09 DIAGNOSIS — Z91013 Allergy to seafood: Secondary | ICD-10-CM | POA: Insufficient documentation

## 2015-06-09 DIAGNOSIS — Z823 Family history of stroke: Secondary | ICD-10-CM | POA: Insufficient documentation

## 2015-06-09 DIAGNOSIS — R262 Difficulty in walking, not elsewhere classified: Secondary | ICD-10-CM

## 2015-06-09 DIAGNOSIS — F209 Schizophrenia, unspecified: Secondary | ICD-10-CM | POA: Insufficient documentation

## 2015-06-09 DIAGNOSIS — M4800 Spinal stenosis, site unspecified: Secondary | ICD-10-CM | POA: Insufficient documentation

## 2015-06-09 DIAGNOSIS — M797 Fibromyalgia: Secondary | ICD-10-CM

## 2015-06-09 DIAGNOSIS — G4733 Obstructive sleep apnea (adult) (pediatric): Secondary | ICD-10-CM | POA: Insufficient documentation

## 2015-06-09 DIAGNOSIS — Z79891 Long term (current) use of opiate analgesic: Secondary | ICD-10-CM | POA: Insufficient documentation

## 2015-06-09 DIAGNOSIS — M609 Myositis, unspecified: Secondary | ICD-10-CM | POA: Insufficient documentation

## 2015-06-09 DIAGNOSIS — Z79899 Other long term (current) drug therapy: Secondary | ICD-10-CM

## 2015-06-09 DIAGNOSIS — Z87891 Personal history of nicotine dependence: Secondary | ICD-10-CM

## 2015-06-09 DIAGNOSIS — Z818 Family history of other mental and behavioral disorders: Secondary | ICD-10-CM

## 2015-06-09 DIAGNOSIS — M47816 Spondylosis without myelopathy or radiculopathy, lumbar region: Secondary | ICD-10-CM | POA: Insufficient documentation

## 2015-06-09 DIAGNOSIS — F4310 Post-traumatic stress disorder, unspecified: Secondary | ICD-10-CM

## 2015-06-09 DIAGNOSIS — I10 Essential (primary) hypertension: Secondary | ICD-10-CM | POA: Insufficient documentation

## 2015-06-09 DIAGNOSIS — R0789 Other chest pain: Secondary | ICD-10-CM | POA: Insufficient documentation

## 2015-06-09 DIAGNOSIS — G893 Neoplasm related pain (acute) (chronic): Secondary | ICD-10-CM | POA: Insufficient documentation

## 2015-06-09 DIAGNOSIS — M4854XA Collapsed vertebra, not elsewhere classified, thoracic region, initial encounter for fracture: Secondary | ICD-10-CM | POA: Insufficient documentation

## 2015-06-09 DIAGNOSIS — E785 Hyperlipidemia, unspecified: Secondary | ICD-10-CM | POA: Insufficient documentation

## 2015-06-09 DIAGNOSIS — J9620 Acute and chronic respiratory failure, unspecified whether with hypoxia or hypercapnia: Secondary | ICD-10-CM

## 2015-06-09 DIAGNOSIS — J90 Pleural effusion, not elsewhere classified: Secondary | ICD-10-CM | POA: Insufficient documentation

## 2015-06-09 DIAGNOSIS — K602 Anal fissure, unspecified: Secondary | ICD-10-CM | POA: Insufficient documentation

## 2015-06-09 LAB — COMPREHENSIVE METABOLIC PANEL
ALT: 13 U/L — ABNORMAL LOW (ref 14–54)
ANION GAP: 4 — AB (ref 5–15)
AST: 54 U/L — ABNORMAL HIGH (ref 15–41)
Albumin: 2 g/dL — ABNORMAL LOW (ref 3.5–5.0)
Alkaline Phosphatase: 122 U/L (ref 38–126)
BILIRUBIN TOTAL: 0.8 mg/dL (ref 0.3–1.2)
BUN: 14 mg/dL (ref 6–20)
CO2: 30 mmol/L (ref 22–32)
Calcium: 10.8 mg/dL — ABNORMAL HIGH (ref 8.9–10.3)
Chloride: 101 mmol/L (ref 101–111)
Creatinine, Ser: 0.94 mg/dL (ref 0.44–1.00)
GFR calc Af Amer: >60 mL/min (ref >60)
Glucose, Bld: 122 mg/dL — ABNORMAL HIGH (ref 65–99)
POTASSIUM: 3.2 mmol/L — AB (ref 3.5–5.1)
Sodium: 135 mmol/L (ref 135–145)
TOTAL PROTEIN: 6.1 g/dL — AB (ref 6.5–8.1)

## 2015-06-09 LAB — CBC WITH DIFFERENTIAL/PLATELET
Basophils Absolute: 0.1 10*3/uL (ref 0–0.1)
Eosinophils Absolute: 1 10*3/uL — ABNORMAL HIGH (ref 0–0.7)
Eosinophils Relative: 10 %
HEMATOCRIT: 23.6 % — AB (ref 35.0–47.0)
Hemoglobin: 7.5 g/dL — ABNORMAL LOW (ref 12.0–16.0)
Lymphs Abs: 1.4 10*3/uL (ref 1.0–3.6)
MCH: 29.1 pg (ref 26.0–34.0)
MCHC: 31.7 g/dL — AB (ref 32.0–36.0)
MCV: 92 fL (ref 80.0–100.0)
MONO ABS: 0.6 10*3/uL (ref 0.2–0.9)
NEUTROS ABS: 6.9 10*3/uL — AB (ref 1.4–6.5)
Neutrophils Relative %: 69 %
Platelets: 202 10*3/uL (ref 150–440)
RBC: 2.56 MIL/uL — ABNORMAL LOW (ref 3.80–5.20)
RDW: 20.1 % — AB (ref 11.5–14.5)
WBC: 10 10*3/uL (ref 3.6–11.0)

## 2015-06-09 LAB — LIPASE, BLOOD: LIPASE: 37 U/L (ref 11–51)

## 2015-06-09 LAB — TROPONIN I

## 2015-06-09 MED ORDER — MORPHINE SULFATE (PF) 4 MG/ML IV SOLN
4.0000 mg | Freq: Once | INTRAVENOUS | Status: AC
Start: 2015-06-09 — End: 2015-06-09
  Administered 2015-06-09: 4 mg via INTRAVENOUS
  Filled 2015-06-09: qty 1

## 2015-06-09 MED ORDER — ONDANSETRON HCL 4 MG/2ML IJ SOLN
4.0000 mg | Freq: Once | INTRAMUSCULAR | Status: AC
  Administered 2015-06-09: 4 mg via INTRAVENOUS
  Filled 2015-06-09: qty 2

## 2015-06-09 MED ORDER — SODIUM CHLORIDE 0.9 % IV BOLUS (SEPSIS)
1000.0000 mL | Freq: Once | INTRAVENOUS | Status: AC
  Administered 2015-06-09: 1000 mL via INTRAVENOUS

## 2015-06-09 NOTE — ED Notes (Signed)
Pt does not wish to have port accessed.

## 2015-06-09 NOTE — ED Notes (Signed)
Ice chips provided, pt readjusted in bed again for comfort.

## 2015-06-09 NOTE — ED Notes (Signed)
Pt readjusted in bed again for comfort. Explanation of delay provided to pt who verbalizes understanding.

## 2015-06-09 NOTE — ED Notes (Signed)
Barrier cream applied to buttocks by aide per pt request. Pt provided with additional remote control, additional pillow and blankets for comfort. Pt requesting pain medication, md notified.

## 2015-06-09 NOTE — ED Notes (Signed)
Pt states  History of right sided lung cancer. Pt states has been increasingly shob over last 3-4 days. Ems states gave pt a duoneb with improvement in shob. Pt arrives with 4lpm  in place. Skin sallow, diminished breath sounds in all lung fields, rhonchi also auscultated in rul and rll.

## 2015-06-09 NOTE — ED Provider Notes (Signed)
[facility] Emergency Department Provider Note   ____________________________________________  Time seen: Approximately 11:13 PM  I have reviewed the triage vital signs and the nursing notes.   HISTORY  Chief Complaint Shortness of Breath    HPI MALAISHA [name] is a 60 y.o. female who presents to the ED via EMS from home with a chief complaint of shortness of breath. Patient with a history of COPD and lung cancer, on continuous O2 at 2 L nasal cannula. Complains of increasing shortness of breath and generalized weakness over the past 3-4 days. This associated with chest tightness. Denies associated fever, chills, abdominal pain, nausea, vomiting, diarrhea.States she has been unable to walk with her walker secondary to generalized weakness. Patient received DuoNeb per EMS with improvement and shortness of breath. Denies recent travel or trauma.    Past Medical History  Diagnosis Date  . Schizophrenia ([place])   . Asthma   . GERD (gastroesophageal reflux disease)   . Anxiety   . Depression   . Bipolar disorder ([place])   . COPD (chronic obstructive pulmonary disease) ([place])   . Occasional tremors     right hand  . PTSD (post-traumatic stress disorder)   . Shortness of breath dyspnea   . Fibromyalgia   . DVT (deep venous thrombosis) ([place]) 2011    RUE  . Thyroid nodule   . DDD (degenerative disc disease), lumbar   . Spinal stenosis   . Peripheral neuropathy ([place])   . Rotator cuff tear     right  . Pneumonia 2011  . Hypothyroidism     no meds currently  . Anemia     during pregnancy only  . Hypertension     Off meds x 15 years-well controlled now per pt  . Severe obstructive sleep apnea 06/27/2014  . DVT of upper extremity (deep vein thrombosis) ([place]) 10/14/2014  . Squamous cell cancer, anus (HCC)   . Lung cancer ([place])   . Hemorrhoid 06/27/2014  . SBO (small bowel obstruction) [facility])     Patient Active Problem List   Diagnosis Date Noted  .  Sensorineural hearing loss of both ears 05/24/2015  . Respiratory failure, acute and chronic ([place]) 05/24/2015  . Impaired mobility 05/22/2015  . Malnutrition ([place]) 05/03/2015  . Lung cancer ([place]) 05/01/2015  . Osteoporosis 05/01/2015  . Chronic low back pain 05/01/2015  . Radicular pain of thoracic region 05/01/2015  . Lumbar facet arthropathy 05/01/2015  . Lumbar facet syndrome 05/01/2015  . Thoracic spondylosis 05/01/2015  . Lumbar spondylosis 05/01/2015  . T12 compression fracture ([place]) (Since 2013) 05/01/2015  . T6 compression fracture (since 2015) 05/01/2015  . Chronic pain 04/30/2015  . Long term current use of opiate analgesic 04/30/2015  . Long term prescription opiate use 04/30/2015  . Opiate use (60 MME/Day) 04/30/2015  . Encounter for therapeutic drug level monitoring 04/30/2015  . Cancer-related pain 04/30/2015  . Compression fracture of thoracic vertebra (HCC) (T6, T9, T10, and T12) 04/30/2015  . Weakness generalized 04/30/2015  . No appetite 04/30/2015  . Generalized weakness 04/22/2015  . Recurrent right pleural effusion 04/08/2015  . Hospital discharge follow-up 04/03/2015  . Recurrent pneumonia   . RUQ abdominal pain   . Sepsis ([place]) 03/15/2015  . CAP (community acquired pneumonia) 03/15/2015  . Pyrexia   . Coarse tremors 03/14/2015  . Insomnia 03/14/2015  . Poor mobility 03/14/2015  . HCAP (healthcare-associated pneumonia) 03/05/2015  . 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  GI bleed 03/02/2015  . Anemia   . Aspiration pneumonitis ([place]) 02/27/2015  . Small bowel obstruction ([place]) 02/27/2015  . Hyperglycemia 02/27/2015  . Chronic pain syndrome 02/27/2015  . COPD (chronic obstructive pulmonary disease) ([place]) 02/26/2015  . Fibromyalgia 12/12/2014  . B12 deficiency 09/28/2014  . Folate deficiency 09/28/2014  . Macrocytosis 09/21/2014  . Depression   . Hypokalemia 07/20/2014  . Anal cancer ([place]) 06/27/2014  . Anal fissure  06/27/2014  . Anxiety 06/27/2014  . Airway hyperreactivity 06/27/2014  . H/O manic depressive disorder 06/27/2014  . CAFL (chronic airflow limitation) ([place]) 06/27/2014  . Clinical depression 06/27/2014  . Deep vein thrombosis ([place]) 06/27/2014  . External hemorrhoid 06/27/2014  . Myalgia and myositis 06/27/2014  . GERD (gastroesophageal reflux disease) 06/27/2014  . HTN (hypertension) 06/27/2014  . HLD (hyperlipidemia) 06/27/2014  . Adult hypothyroidism 06/27/2014  . Mass of perianal area 06/27/2014  . 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 Ulcerated hemorrhoid 06/27/2014  . Severe obstructive sleep apnea 06/27/2014  . Squamous cell cancer, anus (HCC)   . Rectal ulcer 05/17/2014    Past Surgical History  Procedure Laterality Date  . Foot surgery Right   . Tubal ligation    . Eye surgery Bilateral   . Mouth surgery  2002  . Rectal biopsy N/A 05/08/2014    Procedure: BIOPSY RECTAL;  Surgeon: Marlyce Huge, MD;  Location: [facility] ORS;  Service: General;  Laterality: N/A;  . Evaluation under anesthesia with hemorrhoidectomy N/A 05/08/2014    Procedure: EXAM UNDER ANESTHESIA WITH HEMORRHOIDECTOMY;  Surgeon: Marlyce Huge, MD;  Location: [facility] ORS;  Service: General;  Laterality: N/A;  . Portacath placement N/A 05/20/2014    Procedure: INSERTION PORT-A-CATH;  Surgeon: Florene Glen, MD;  Location: [facility] ORS;  Service: General;  Laterality: N/A;  . Laparoscopic diverted colostomy N/A 05/26/2014    Procedure: LAPAROSCOPIC DIVERTED COLOSTOMY;  Surgeon: Marlyce Huge, MD;  Location: [facility] ORS;  Service: General;  Laterality: N/A;  . Peripheral vascular catheterization Left 10/16/2014    Procedure: Upper Extremity Venography with thrombectomy, port removal;  Surgeon: Algernon Huxley, MD;  Location: [place] [facility] LAB;  Service: Cardiovascular;  Laterality: Left;  . Peripheral vascular catheterization  10/16/2014    Procedure: Upper Extremity Intervention;  Surgeon: Algernon Huxley,  MD;  Location: [place] CV LAB;  Service: Cardiovascular;;  . Video assisted thoracoscopy (vats)/thorocotomy Right 04/10/2015    Procedure: PRE-OP BRONCHOSCOPY, VIDEO ASSISTED THORACOSCOPY RIGHT WITH PLEURAL BIOPSIES, & PLEURAL DESIS;  Surgeon: Nestor Lewandowsky, MD;  Location: [facility] ORS;  Service: Thoracic;  Laterality: Right;  . Esophagogastroduodenoscopy N/A 04/19/2015    Procedure: ESOPHAGOGASTRODUODENOSCOPY (EGD);  Surgeon: Hulen Luster, MD;  Location: [facility] ENDOSCOPY;  Service: Endoscopy;  Laterality: N/A;  . Colonoscopy N/A 04/19/2015    Procedure: COLONOSCOPY;  Surgeon: Hulen Luster, MD;  Location: [facility] ENDOSCOPY;  Service: Endoscopy;  Laterality: N/A;  . Peripheral vascular catheterization N/A 05/14/2015    Procedure: Porta Cath Insertion;  Surgeon: Algernon Huxley, MD;  Location: [place] CV LAB;  Service: Cardiovascular;  Laterality: N/A;    Current Outpatient Rx  Name  Route  Sig  Dispense  Refill  . albuterol (PROVENTIL) (2.5 MG/3ML) 0.083% nebulizer solution   Nebulization   Take 3 mLs (2.5 mg total) by nebulization every 4 (four) hours. Patient taking differently: Take 2.5 mg by nebulization every 4 (four) hours as needed for wheezing or shortness of breath.    360 vial   12   . apixaban (ELIQUIS)  2.5 MG TABS tablet   Oral   Take 1 tablet (2.5 mg total) by mouth 2 (two) times daily.   60 tablet   3     Pt has hx of clot after portacath being put in. Sh ...   . ARIPiprazole (ABILIFY) 2 MG tablet   Oral   Take 1 tablet (2 mg total) by mouth daily.   30 tablet   4   . aspirin EC 81 MG EC tablet   Oral   Take 1 tablet (81 mg total) by mouth daily.   30 tablet   1   . budesonide-formoterol (SYMBICORT) 160-4.5 MCG/ACT inhaler   Inhalation   Inhale 2 puffs into the lungs 2 (two) times daily.   1 Inhaler   12   . cholecalciferol 1000 units tablet   Oral   Take 1 tablet (1,000 Units total) by mouth daily.   30 tablet   0   . clonazePAM (KLONOPIN) 1 MG tablet   Oral    Take 1 tablet (1 mg total) by mouth 3 (three) times daily as needed for anxiety.   90 tablet   4   . docusate sodium (COLACE) 100 MG capsule   Oral   Take 100 mg by mouth 2 (two) times daily.         . DULoxetine (CYMBALTA) 60 MG capsule   Oral   Take 60 mg by mouth every morning.          Marland Kitchen ENSURE (ENSURE)   Oral   Take 237 mLs by mouth 2 (two) times daily between meals. Reported on 05/14/2015         . escitalopram (LEXAPRO) 20 MG tablet   Oral   Take 1 tablet (20 mg total) by mouth daily.   30 tablet   4     HOLD UNTIL PATIENT CALLS FOR REFILL   . ferrous sulfate 325 (65 FE) MG tablet   Oral   Take 1 tablet (325 mg total) by mouth 2 (two) times daily with a meal.   60 tablet   0   . folic acid (FOLVITE) 1 MG tablet   Oral   Take 1 tablet (1 mg total) by mouth daily.   30 tablet   3   . VENTOLIN HFA 108 (90 Base) MCG/ACT inhaler   Inhalation   Inhale 2 puffs into the lungs every 4 (four) hours as needed for wheezing or shortness of breath. Reported on 05/14/2015           Dispense as written.   Marland Kitchen LYRICA 200 MG capsule   Oral   Take 200 mg by mouth as needed. Reported on 05/15/2015           Dispense as written.   . nicotine (NICODERM CQ - DOSED IN MG/24 HOURS) 21 mg/24hr patch   Transdermal   Place 1 patch (21 mg total) onto the skin daily.   28 patch   0   . nystatin (MYCOSTATIN) 100000 UNIT/ML suspension   Oral   Take 5 mLs (500,000 Units total) by mouth 4 (four) times daily.   60 mL   2   . ondansetron (ZOFRAN) 4 MG tablet   Oral   Take 1 tablet (4 mg total) by mouth every 6 (six) hours as needed for nausea.   20 tablet   0   . oxyCODONE-acetaminophen (PERCOCET) 10-325 MG tablet   Oral   Take 1 tablet by mouth every 6 (six) hours  as needed. Reported on 04/30/2015   112 tablet   0   . pantoprazole (PROTONIX) 40 MG tablet   Oral   Take 40 mg by mouth 2 (two) times daily.          . polyethylene glycol (MIRALAX / GLYCOLAX) packet    Oral   Take 17 g by mouth daily.   14 each   0   . predniSONE (STERAPRED UNI-PAK 21 TAB) 10 MG (21) TBPK tablet      Take 6 tabs first day, 5 tab on day 2, then 4 on day 3rd, 3 tabs on day 4th , 2 tab on day 5th, and 1 tab on 6th day.   21 tablet   0   . protein supplement (RESOURCE BENEPROTEIN) POWD   Oral   Take 1 scoop by mouth 2 (two) times daily between meals.         . tiotropium (SPIRIVA) 18 MCG inhalation capsule   Inhalation   Place 1 capsule (18 mcg total) into inhaler and inhale daily.   30 capsule   12   . vitamin B-12 (CYANOCOBALAMIN) 1000 MCG tablet   Oral   Take 1,000 mcg by mouth daily. Reported on 05/14/2015         . zolpidem (AMBIEN) 10 MG tablet   Oral   Take 1 tablet (10 mg total) by mouth at bedtime as needed for sleep.   30 tablet   4     Allergies Fish allergy and Sulfa antibiotics  Family History  Problem Relation Age of Onset  . Cancer Maternal Aunt   . Cancer Paternal Uncle   . Diabetes Mother   . Thyroid disease Mother   . Kidney failure Mother   . Hypertension Mother   . Depression Mother   . Mental illness Son   . Aneurysm Maternal Grandmother   . Thyroid disease Maternal Grandmother   . Stroke Maternal Grandfather   . Hypertension Maternal Grandfather   . Diabetes Maternal Grandfather   . Heart disease Maternal Grandfather     MI  . Nephrolithiasis Daughter     Social History Social History  Substance Use Topics  . Smoking status: Former Smoker -- 0.10 packs/day for 44 years    Types: Cigarettes    Start date: 10/04/1970    Quit date: 03/26/2015  . Smokeless tobacco: Never Used  . Alcohol Use: No     Comment: 1 drink every 2-3 times/year    Review of Systems  Constitutional: Positive for generalized weakness. No fever/chills. Eyes: No visual changes. ENT: No sore throat. Cardiovascular: Positive for chest pain. Respiratory: Positive for shortness of breath. Gastrointestinal: No abdominal pain.  No nausea, no  vomiting.  No diarrhea.  No constipation. Genitourinary: Negative for dysuria. Musculoskeletal: Negative for back pain. Skin: Negative for rash. Neurological: Negative for headaches, focal weakness or numbness.  10-point ROS otherwise negative.  ____________________________________________   PHYSICAL EXAM:  VITAL SIGNS: ED Triage Vitals  Enc Vitals Group     BP 06/09/15 2146 107/75 mmHg     Pulse Rate 06/09/15 2146 105     Resp 06/09/15 2146 24     Temp 06/09/15 2146 98.1 F (36.7 C)     Temp Source 06/09/15 2146 Oral     SpO2 06/09/15 2146 92 %     Weight 06/09/15 2146 190 lb (86.183 kg)     Height 06/09/15 2146 '5\' 6"'$  (1.676 m)     Head Cir --  Peak Flow --      Pain Score 06/09/15 2147 9     Pain Loc --      Pain Edu? --      Excl. in [place]? --     Constitutional: Alert and oriented. Well appearing and in mild acute distress. Eyes: Conjunctivae are normal. PERRL. EOMI. Head: Atraumatic. Nose: No congestion/rhinnorhea. Mouth/Throat: Mucous membranes are moist.  Oropharynx non-erythematous. Neck: No stridor.   Cardiovascular: Normal rate, regular rhythm. Grossly normal heart sounds.  Good peripheral circulation. Respiratory: Increased respiratory effort.  No retractions. Lungs with scattered rhonchi. Gastrointestinal: Soft and mildly tender palpation right upper quadrant without rebound or guarding. No distention. No abdominal bruits. No CVA tenderness. Colostomy bag in place with solid stool noted. Musculoskeletal: No lower extremity tenderness nor edema.  No joint effusions. Neurologic:  Normal speech and language. No gross focal neurologic deficits are appreciated.  Skin:  Skin is warm, dry and intact. No rash noted. Psychiatric: Mood and affect are normal. Speech and behavior are normal.  ____________________________________________   LABS (all labs ordered are listed, but only abnormal results are displayed)  Labs Reviewed  CBC WITH DIFFERENTIAL/PLATELET -  Abnormal; Notable for the following:    RBC 2.56 (*)    Hemoglobin 7.5 (*)    HCT 23.6 (*)    MCHC 31.7 (*)    RDW 20.1 (*)    Neutro Abs 6.9 (*)    Eosinophils Absolute 1.0 (*)    All other components within normal limits  COMPREHENSIVE METABOLIC PANEL - Abnormal; Notable for the following:    Potassium 3.2 (*)    Glucose, Bld 122 (*)    Calcium 10.8 (*)    Total Protein 6.1 (*)    Albumin 2.0 (*)    AST 54 (*)    ALT 13 (*)    Anion gap 4 (*)    All other components within normal limits  TROPONIN I  LIPASE, BLOOD  TYPE AND SCREEN  PREPARE RBC (CROSSMATCH)   ____________________________________________  EKG  ED ECG REPORT I, Marzell Isakson J, the attending physician, personally viewed and interpreted this ECG.   Date: 06/10/2015  EKG Time: 2156  Rate: 105  Rhythm: sinus tachycardia  Axis: Normal  Intervals:none  ST&T Change: Nonspecific  ____________________________________________  RADIOLOGY  Chest 1 view (viewed by me, interpreted per Dr. who): No evidence of acute abnormality.  Continued right basilar/ pleural opacity.  RUQ ultrasound interpreted per Dr. Radene Knee: 1. No acute abnormality seen within the right upper quadrant. 2. Gallbladder wall thickening is nonspecific given the patient's underlying ascites. Sludge within the gallbladder. No definite evidence for obstruction or cholecystitis. 3. Findings of hepatic cirrhosis. 4. Moderate volume ascites noted within the abdomen. ____________________________________________   PROCEDURES  Procedure(s) performed: None  Critical Care performed: No  ____________________________________________   INITIAL IMPRESSION / ASSESSMENT AND PLAN / ED COURSE  Pertinent labs & imaging results that were available during my care of the patient were reviewed by me and considered in my medical decision making (see chart for details).  60 year old female with COPD and lung cancer who presents with shortness of breath and  right upper quadrant abdominal pain. Scattered rhonchi on lung exam; no active wheezing. Will obtain screening lab work and also right upper quadrant ultrasound.  ----------------------------------------- 4:10 AM on 06/10/2015 -----------------------------------------  Laboratory results notable for baseline anemia. Attempted to have patient ambulate with walker; patient was able to only walk 2 steps before being fatigued and tachypneic. I believe a transfusion  of PRBCs would aid in patient symptomatic anemia. Will discuss with hospitalist to evaluate patient in the emergency department for admission. ____________________________________________   FINAL CLINICAL IMPRESSION(S) / ED DIAGNOSES  Final diagnoses:  Symptomatic anemia  Acute on chronic respiratory failure, unspecified whether with hypoxia or hypercapnia (HCC)  Malignant neoplasm of lung, unspecified laterality, unspecified part of lung (HCC)  Generalized weakness  Pulmonary emphysema, unspecified emphysema type ([place])      NEW MEDICATIONS STARTED DURING THIS VISIT:  New Prescriptions   No medications on file     Note:  This document was prepared using Dragon voice recognition software and may include unintentional dictation errors.    Paulette Blanch, MD 06/10/15 [phone]

## 2015-06-09 NOTE — ED Notes (Signed)
Pt provided with multiple blankets and pillows, tv remote for comfort. Pt's bed adjusted multiple times for comfort.

## 2015-06-10 ENCOUNTER — Emergency Department: Payer: Commercial Managed Care - HMO

## 2015-06-10 DIAGNOSIS — R531 Weakness: Secondary | ICD-10-CM

## 2015-06-10 LAB — HEMOGLOBIN A1C: Hgb A1c MFr Bld: 3.6 % — ABNORMAL LOW (ref 4.0–6.0)

## 2015-06-10 LAB — PREPARE RBC (CROSSMATCH)

## 2015-06-10 LAB — TSH: TSH: 2.718 u[IU]/mL (ref 0.350–4.500)

## 2015-06-10 MED ORDER — PANTOPRAZOLE SODIUM 40 MG PO TBEC
40.0000 mg | DELAYED_RELEASE_TABLET | Freq: Two times a day (BID) | ORAL | Status: DC
  Administered 2015-06-10 – 2015-06-11 (×3): 40 mg via ORAL
  Filled 2015-06-10 (×3): qty 1

## 2015-06-10 MED ORDER — SODIUM CHLORIDE 0.9 % IV SOLN
Freq: Once | INTRAVENOUS | Status: AC
  Administered 2015-06-10: 16:00:00 via INTRAVENOUS

## 2015-06-10 MED ORDER — FERROUS SULFATE 325 (65 FE) MG PO TABS
325.0000 mg | ORAL_TABLET | Freq: Two times a day (BID) | ORAL | Status: DC
Start: 2015-06-10 — End: 2015-06-11
  Administered 2015-06-10 – 2015-06-11 (×3): 325 mg via ORAL
  Filled 2015-06-10 (×3): qty 1

## 2015-06-10 MED ORDER — ONDANSETRON HCL 4 MG PO TABS: 4.0000 mg | ORAL_TABLET | Freq: Four times a day (QID) | ORAL | Status: DC | PRN

## 2015-06-10 MED ORDER — SODIUM CHLORIDE 0.9 % IV SOLN
10.0000 mL/h | Freq: Once | INTRAVENOUS | Status: AC
  Administered 2015-06-10: 11:00:00 10 mL/h via INTRAVENOUS

## 2015-06-10 MED ORDER — VITAMIN D 1000 UNITS PO TABS
1000.0000 [IU] | ORAL_TABLET | Freq: Every day | ORAL | Status: DC
  Administered 2015-06-10 – 2015-06-11 (×2): 1000 [IU] via ORAL
  Filled 2015-06-10 (×2): qty 1

## 2015-06-10 MED ORDER — ACETAMINOPHEN 325 MG PO TABS
650.0000 mg | ORAL_TABLET | Freq: Four times a day (QID) | ORAL | Status: DC | PRN
Start: 2015-06-10 — End: 2015-06-11

## 2015-06-10 MED ORDER — DULOXETINE HCL 60 MG PO CPEP
60.0000 mg | ORAL_CAPSULE | ORAL | Status: DC
  Administered 2015-06-10 – 2015-06-11 (×2): 60 mg via ORAL
  Filled 2015-06-10 (×2): qty 1

## 2015-06-10 MED ORDER — FOLIC ACID 1 MG PO TABS
1.0000 mg | ORAL_TABLET | Freq: Every day | ORAL | Status: DC
  Administered 2015-06-10 – 2015-06-11 (×2): 1 mg via ORAL
  Filled 2015-06-10 (×2): qty 1

## 2015-06-10 MED ORDER — ZOLPIDEM TARTRATE 5 MG PO TABS: 10.0000 mg | ORAL_TABLET | Freq: Every evening | ORAL | Status: DC | PRN

## 2015-06-10 MED ORDER — APIXABAN 5 MG PO TABS
2.5000 mg | ORAL_TABLET | Freq: Two times a day (BID) | ORAL | Status: DC
  Administered 2015-06-10 – 2015-06-11 (×3): 2.5 mg via ORAL
  Filled 2015-06-10 (×3): qty 1

## 2015-06-10 MED ORDER — DOCUSATE SODIUM 100 MG PO CAPS
100.0000 mg | ORAL_CAPSULE | Freq: Two times a day (BID) | ORAL | Status: DC
  Administered 2015-06-10 – 2015-06-11 (×3): 100 mg via ORAL
  Filled 2015-06-10 (×3): qty 1

## 2015-06-10 MED ORDER — ARIPIPRAZOLE 2 MG PO TABS
2.0000 mg | ORAL_TABLET | Freq: Every day | ORAL | Status: DC
  Administered 2015-06-10 – 2015-06-11 (×2): 2 mg via ORAL
  Filled 2015-06-10 (×2): qty 1

## 2015-06-10 MED ORDER — ESCITALOPRAM OXALATE 10 MG PO TABS
20.0000 mg | ORAL_TABLET | Freq: Every day | ORAL | Status: DC
  Administered 2015-06-11: 10:00:00 20 mg via ORAL
  Filled 2015-06-10 (×2): qty 2

## 2015-06-10 MED ORDER — NYSTATIN 100000 UNIT/ML MT SUSP
5.0000 mL | Freq: Four times a day (QID) | OROMUCOSAL | Status: DC
  Administered 2015-06-10 – 2015-06-11 (×4): 500000 [IU] via ORAL
  Filled 2015-06-10 (×5): qty 5

## 2015-06-10 MED ORDER — ALBUTEROL SULFATE (2.5 MG/3ML) 0.083% IN NEBU
2.5000 mg | INHALATION_SOLUTION | RESPIRATORY_TRACT | Status: DC | PRN
Start: 2015-06-10 — End: 2015-06-11

## 2015-06-10 MED ORDER — ALUM & MAG HYDROXIDE-SIMETH 200-200-20 MG/5ML PO SUSP
30.0000 mL | Freq: Four times a day (QID) | ORAL | Status: DC | PRN
  Administered 2015-06-10: 30 mL via ORAL
  Filled 2015-06-10: qty 30

## 2015-06-10 MED ORDER — TIOTROPIUM BROMIDE MONOHYDRATE 18 MCG IN CAPS
18.0000 ug | ORAL_CAPSULE | Freq: Every day | RESPIRATORY_TRACT | Status: DC
  Administered 2015-06-10 – 2015-06-11 (×2): 18 ug via RESPIRATORY_TRACT
  Filled 2015-06-10: qty 5

## 2015-06-10 MED ORDER — ONDANSETRON HCL 4 MG/2ML IJ SOLN
4.0000 mg | Freq: Four times a day (QID) | INTRAMUSCULAR | Status: DC | PRN
Start: 2015-06-10 — End: 2015-06-11

## 2015-06-10 MED ORDER — CLONAZEPAM 1 MG PO TABS: 1.0000 mg | ORAL_TABLET | Freq: Three times a day (TID) | ORAL | Status: DC | PRN

## 2015-06-10 MED ORDER — ACETAMINOPHEN 650 MG RE SUPP: 650.0000 mg | Freq: Four times a day (QID) | RECTAL | Status: DC | PRN

## 2015-06-10 MED ORDER — POLYETHYLENE GLYCOL 3350 17 G PO PACK
17.0000 g | PACK | Freq: Every day | ORAL | Status: DC
Start: 2015-06-10 — End: 2015-06-11
  Administered 2015-06-10 – 2015-06-11 (×2): 17 g via ORAL
  Filled 2015-06-10 (×2): qty 1

## 2015-06-10 MED ORDER — ASPIRIN EC 81 MG PO TBEC
81.0000 mg | DELAYED_RELEASE_TABLET | Freq: Every day | ORAL | Status: DC
  Administered 2015-06-10 – 2015-06-11 (×2): 81 mg via ORAL
  Filled 2015-06-10 (×2): qty 1

## 2015-06-10 MED ORDER — VITAMIN B-12 1000 MCG PO TABS
1000.0000 ug | ORAL_TABLET | Freq: Every day | ORAL | Status: DC
  Administered 2015-06-10 – 2015-06-11 (×2): 1000 ug via ORAL
  Filled 2015-06-10 (×2): qty 1

## 2015-06-10 MED ORDER — MOMETASONE FURO-FORMOTEROL FUM 200-5 MCG/ACT IN AERO
2.0000 | INHALATION_SPRAY | Freq: Two times a day (BID) | RESPIRATORY_TRACT | Status: DC
  Administered 2015-06-10 – 2015-06-11 (×2): 2 via RESPIRATORY_TRACT
  Filled 2015-06-10: qty 8.8

## 2015-06-10 MED ORDER — OXYCODONE-ACETAMINOPHEN 5-325 MG PO TABS
1.0000 | ORAL_TABLET | Freq: Four times a day (QID) | ORAL | Status: DC | PRN
  Administered 2015-06-10 – 2015-06-11 (×4): 2 via ORAL
  Filled 2015-06-10 (×4): qty 2

## 2015-06-10 MED ORDER — MORPHINE SULFATE (PF) 2 MG/ML IV SOLN
2.0000 mg | Freq: Once | INTRAVENOUS | Status: AC
  Administered 2015-06-10: 2 mg via INTRAVENOUS
  Filled 2015-06-10: qty 1

## 2015-06-10 MED ORDER — NICOTINE 21 MG/24HR TD PT24
21.0000 mg | MEDICATED_PATCH | Freq: Every day | TRANSDERMAL | Status: DC
  Administered 2015-06-10 – 2015-06-11 (×2): 21 mg via TRANSDERMAL
  Filled 2015-06-10 (×2): qty 1

## 2015-06-10 MED ORDER — BENEPROTEIN PO POWD
1.0000 | Freq: Two times a day (BID) | ORAL | Status: DC
  Filled 2015-06-10 (×2): qty 227

## 2015-06-10 MED ORDER — ENSURE PO LIQD
237.0000 mL | Freq: Two times a day (BID) | ORAL | Status: DC
  Administered 2015-06-10 – 2015-06-11 (×3): 237 mL via ORAL
  Filled 2015-06-10 (×4): qty 237

## 2015-06-10 MED ORDER — PREGABALIN 50 MG PO CAPS: 200.0000 mg | ORAL_CAPSULE | Freq: Two times a day (BID) | ORAL | Status: DC | PRN

## 2015-06-10 NOTE — H&P (Signed)
[name] is an 60 y.o. female.   Chief Complaint: Chest pain HPI: The patient with past medical history of rectal cancer status post colectomy with metastatic squamous cell carcinoma presents to the emergency department complaining of chest pain. The patient states the pain begins at rest and feels like somebody standing on her chest. She denies associated shortness of breath but admits to some nausea. She has had no diaphoresis. Initial workup in the emergency department was negative but prior to discharge the patient was found to be unable to walk even with the help of a walker. Due to generalized weakness as well as ongoing chest pain the emergency department staff called for admission.  Past Medical History  Diagnosis Date  . Schizophrenia ([place])   . Asthma   . GERD (gastroesophageal reflux disease)   . Anxiety   . Depression   . Bipolar disorder ([place])   . COPD (chronic obstructive pulmonary disease) ([place])   . Occasional tremors     right hand  . PTSD (post-traumatic stress disorder)   . Shortness of breath dyspnea   . Fibromyalgia   . DVT (deep venous thrombosis) ([place]) 2011    RUE  . Thyroid nodule   . DDD (degenerative disc disease), lumbar   . Spinal stenosis   . Peripheral neuropathy ([place])   . Rotator cuff tear     right  . Pneumonia 2011  . Hypothyroidism     no meds currently  . Anemia     during pregnancy only  . Hypertension     Off meds x 15 years-well controlled now per pt  . Severe obstructive sleep apnea 06/27/2014  . DVT of upper extremity (deep vein thrombosis) ([place]) 10/14/2014  . Squamous cell cancer, anus (H[facility])   . Lung cancer ([place])   . Hemorrhoid 06/27/2014  . SBO (small bowel obstruction) [facility])     Past Surgical History  Procedure Laterality Date  . Foot surgery Right   . Tubal ligation    . Eye surgery Bilateral   . Mouth surgery  2002  . Rectal biopsy N/A 05/08/2014    Procedure: BIOPSY RECTAL;  Surgeon: Marlyce Huge, MD;  Location:  [facility] ORS;  Service: General;  Laterality: N/A;  . Evaluation under anesthesia with hemorrhoidectomy N/A 05/08/2014    Procedure: EXAM UNDER ANESTHESIA WITH HEMORRHOIDECTOMY;  Surgeon: Marlyce Huge, MD;  Location: [facility] ORS;  Service: General;  Laterality: N/A;  . Portacath placement N/A 05/20/2014    Procedure: INSERTION PORT-A-CATH;  Surgeon: Florene Glen, MD;  Location: [facility] ORS;  Service: General;  Laterality: N/A;  . Laparoscopic diverted colostomy N/A 05/26/2014    Procedure: LAPAROSCOPIC DIVERTED COLOSTOMY;  Surgeon: Marlyce Huge, MD;  Location: [facility] ORS;  Service: General;  Laterality: N/A;  . Peripheral vascular catheterization Left 10/16/2014    Procedure: Upper Extremity Venography with thrombectomy, port removal;  Surgeon: Algernon Huxley, MD;  Location: [place] [facility];  Service: Cardiovascular;  Laterality: Left;  . Peripheral vascular catheterization  10/16/2014    Procedure: Upper Extremity Intervention;  Surgeon: Algernon Huxley, MD;  Location: [place] [facility];  Service: Cardiovascular;;  . Video assisted thoracoscopy (vats)/thorocotomy Right 04/10/2015    Procedure: PRE-OP BRONCHOSCOPY, VIDEO ASSISTED THORACOSCOPY RIGHT WITH PLEURAL BIOPSIES, & PLEURAL DESIS;  Surgeon: Nestor Lewandowsky, MD;  Location: [facility] ORS;  Service: Thoracic;  Laterality: Right;  . Esophagogastroduodenoscopy N/A 04/19/2015    Procedure: ESOPHAGOGASTRODUODENOSCOPY (EGD);  Surgeon: Hulen Luster, MD;  Location[facility] ENDOSCOPY;  Service: Endoscopy;  Laterality: N/A;  . Colonoscopy N/A 04/19/2015    Procedure: COLONOSCOPY;  Surgeon: Hulen Luster, MD;  Location: [facility] ENDOSCOPY;  Service: Endoscopy;  Laterality: N/A;  . Peripheral vascular catheterization N/A 05/14/2015    Procedure: Porta Cath Insertion;  Surgeon: Algernon Huxley, MD;  Location: [place] [facility];  Service: Cardiovascular;  Laterality: N/A;    Family History  Problem Relation Age of Onset  . Cancer Maternal Aunt   . Cancer Paternal Uncle    . Diabetes Mother   . Thyroid disease Mother   . Kidney failure Mother   . Hypertension Mother   . Depression Mother   . Mental illness Son   . Aneurysm Maternal Grandmother   . Thyroid disease Maternal Grandmother   . Stroke Maternal Grandfather   . Hypertension Maternal Grandfather   . Diabetes Maternal Grandfather   . Heart disease Maternal Grandfather     MI  . Nephrolithiasis Daughter    Social History:  reports that she quit smoking about 2 months ago. Her smoking use included Cigarettes. She started smoking about 44 years ago. She has a 4.4 pack-year smoking history. She has never used smokeless tobacco. She reports that she does not drink alcohol or use illicit drugs.  Allergies:  Allergies  Allergen Reactions  . Fish Allergy Anaphylaxis    Patient allergic to perch only. She can eat other fish.  . Sulfa Antibiotics Hives    Prior to Admission medications   Medication Sig Start Date End Date Taking? Authorizing Provider  albuterol (PROVENTIL) (2.5 MG/3ML) 0.083% nebulizer solution Take 3 mLs (2.5 mg total) by nebulization every 4 (four) hours. Patient taking differently: Take 2.5 mg by nebulization every 4 (four) hours as needed for wheezing or shortness of breath.  02/27/15  Yes Theodoro Grist, MD  apixaban (ELIQUIS) 2.5 MG TABS tablet Take 1 tablet (2.5 mg total) by mouth 2 (two) times daily. 05/15/15  Yes Lequita Asal, MD  ARIPiprazole (ABILIFY) 2 MG tablet Take 1 tablet (2 mg total) by mouth daily. 01/31/15  Yes Marjie Skiff, MD  aspirin EC 81 MG EC tablet Take 1 tablet (81 mg total) by mouth daily. 04/17/15  Yes Demetrios Loll, MD  budesonide-formoterol [facility]) 160-4.5 MCG/ACT inhaler Inhale 2 puffs into the lungs 2 (two) times daily. 01/30/15  Yes [name], NP  cholecalciferol 1000 units tablet Take 1 tablet (1,000 Units total) by mouth daily. 04/19/15  Yes Hulen Luster, MD  clonazePAM (KLONOPIN) 1 MG tablet Take 1 tablet (1 mg total) by mouth 3 (three) times  daily as needed for anxiety. 01/31/15  Yes Marjie Skiff, MD  docusate sodium (COLACE) 100 MG capsule Take 100 mg by mouth 2 (two) times daily.   Yes Historical Provider, MD  DULoxetine (CYMBALTA) 60 MG capsule Take 60 mg by mouth every morning.    Yes Historical Provider, MD  ENSURE (ENSURE) Take 237 mLs by mouth 2 (two) times daily between meals. Reported on 05/14/2015   Yes Historical Provider, MD  escitalopram (LEXAPRO) 20 MG tablet Take 1 tablet (20 mg total) by mouth daily. 01/31/15  Yes Marjie Skiff, MD  ferrous sulfate 325 (65 FE) MG tablet Take 1 tablet (325 mg total) by mouth 2 (two) times daily with a meal. 04/17/15  Yes Demetrios Loll, MD  folic acid (FOLVITE) 1 MG tablet Take 1 tablet (1 mg total) by mouth daily. 03/12/15  Yes Lequita Asal, MD  LYRICA 200 MG capsule  Take 200 mg by mouth 2 (two) times daily as needed (pain). Reported on 05/15/2015 05/10/15  Yes Historical Provider, MD  nicotine (NICODERM CQ - DOSED IN MG/24 HOURS) 21 mg/24hr patch Place 1 patch (21 mg total) onto the skin daily. 03/02/15  Yes Theodoro Grist, MD  nystatin (MYCOSTATIN) 100000 UNIT/ML suspension Take 5 mLs (500,000 Units total) by mouth 4 (four) times daily. 03/09/15  Yes [name], NP  ondansetron (ZOFRAN) 4 MG tablet Take 1 tablet (4 mg total) by mouth every 6 (six) hours as needed for nausea. 04/19/15  Yes Hulen Luster, MD  oxyCODONE-acetaminophen (PERCOCET) 10-325 MG tablet Take 1 tablet by mouth every 6 (six) hours as needed. Reported on 04/30/2015 05/22/15  Yes [name], NP  pantoprazole (PROTONIX) 40 MG tablet Take 40 mg by mouth 2 (two) times daily.    Yes Historical Provider, MD  polyethylene glycol (MIRALAX / GLYCOLAX) packet Take 17 g by mouth daily. 03/02/15  Yes Theodoro Grist, MD  protein supplement (RESOURCE BENEPROTEIN) POWD Take 1 scoop by mouth 2 (two) times daily between meals.   Yes Historical Provider, MD  tiotropium (SPIRIVA) 18 MCG inhalation capsule Place 1 capsule (18 mcg total) into  inhaler and inhale daily. 02/26/15  Yes [name], NP  VENTOLIN HFA 108 (90 Base) MCG/ACT inhaler Inhale 2 puffs into the lungs every 4 (four) hours as needed for wheezing. Reported on 05/14/2015 03/26/15  Yes Historical Provider, MD  vitamin B-12 (CYANOCOBALAMIN) 1000 MCG tablet Take 1,000 mcg by mouth daily. Reported on 05/14/2015   Yes Historical Provider, MD  zolpidem (AMBIEN) 10 MG tablet Take 1 tablet (10 mg total) by mouth at bedtime as needed for sleep. 01/31/15  Yes Marjie Skiff, MD     Results for orders placed or performed during the hospital encounter of 06/09/15 (from the past 48 hour(s))  CBC with Differential     Status: Abnormal   Collection Time: 06/09/15 10:13 PM  Result Value Ref Range   WBC 10.0 3.6 - 11.0 K/uL   RBC 2.56 (L) 3.80 - 5.20 MIL/uL   Hemoglobin 7.5 (L) 12.0 - 16.0 g/dL   HCT 23.6 (L) 35.0 - 47.0 %   MCV 92.0 80.0 - 100.0 fL   MCH 29.1 26.0 - 34.0 pg   MCHC 31.7 (L) 32.0 - 36.0 g/dL   RDW 20.1 (H) 11.5 - 14.5 %   Platelets 202 150 - 440 K/uL   Neutrophils Relative % 69% %   Neutro Abs 6.9 (H) 1.4 - 6.5 K/uL   Lymphocytes Relative 14% %   Lymphs Abs 1.4 1.0 - 3.6 K/uL   Monocytes Relative 6% %   Monocytes Absolute 0.6 0.2 - 0.9 K/uL   Eosinophils Relative 10% %   Eosinophils Absolute 1.0 (H) 0 - 0.7 K/uL   Basophils Relative 1% %   Basophils Absolute 0.1 0 - 0.1 K/uL  Comprehensive metabolic panel     Status: Abnormal   Collection Time: 06/09/15 10:13 PM  Result Value Ref Range   Sodium 135 135 - 145 mmol/L   Potassium 3.2 (L) 3.5 - 5.1 mmol/L   Chloride 101 101 - 111 mmol/L   CO2 30 22 - 32 mmol/L   Glucose, Bld 122 (H) 65 - 99 mg/dL   BUN 14 6 - 20 mg/dL   Creatinine, Ser 0.94 0.44 - 1.00 mg/dL   Calcium 10.8 (H) 8.9 - 10.3 mg/dL   Total Protein 6.1 (L) 6.5 - 8.1 g/dL   Albumin 2.0 (  L) 3.5 - 5.0 g/dL   AST 54 (H) 15 - 41 U/L   ALT 13 (L) 14 - 54 U/L   Alkaline Phosphatase 122 38 - 126 U/L   Total Bilirubin 0.8 0.3 - 1.2 mg/dL   GFR calc  non Af Amer >60 >60 mL/min   GFR calc Af Amer >60 >60 mL/min    Comment: (NOTE) The eGFR has been calculated using the CKD EPI equation. This calculation has not been validated in all clinical situations. eGFR's persistently <60 mL/min signify possible Chronic Kidney Disease.    Anion gap 4 (L) 5 - 15  Troponin I     Status: None   Collection Time: 06/09/15 10:13 PM  Result Value Ref Range   Troponin I <0.03 <0.031 ng/mL    Comment:        NO INDICATION OF MYOCARDIAL INJURY.   Lipase, blood     Status: None   Collection Time: 06/09/15 10:13 PM  Result Value Ref Range   Lipase 37 11 - 51 U/L  Type and screen [place]     Status: None (Preliminary result)   Collection Time: 06/10/15  4:06 AM  Result Value Ref Range   ABO/RH(D) A POS    Antibody Screen NEG    Sample Expiration 06/13/2015    Unit Number [id number]    Blood Component Type RBC, LR IRR    Unit division 00    Status of Unit ALLOCATED    Transfusion Status OK TO TRANSFUSE    Crossmatch Result Compatible    Unit Number [id number]    Blood Component Type RBC, LR IRR    Unit division 00    Status of Unit ALLOCATED    Transfusion Status OK TO TRANSFUSE    Crossmatch Result Compatible   Prepare RBC     Status: None   Collection Time: 06/10/15  4:06 AM  Result Value Ref Range   Order Confirmation ORDER PROCESSED BY BLOOD BANK    *Note: Due to a large number of results and/or encounters for the requested time period, some results have not been displayed. A complete set of results can be found in Results Review.   Dg Chest 1 View  06/09/2015  CLINICAL DATA:  Shortness of breath for 4 days. History of right pleural biopsies and pleurodesis. EXAM: CHEST 1 VIEW COMPARISON:  05/24/2015 and prior exams FINDINGS: Cardiomediastinal silhouette is unchanged. A right Port-A-Cath is noted with tip overlying the mid SVC. Right basilar/ pleural opacity again noted. There is no evidence of pneumothorax or  significant change from the prior study. IMPRESSION: No evidence of acute abnormality. Continued right basilar/ pleural opacity. Electronically Signed   By: Margarette Canada M.D.   On: 06/09/2015 22:41   US Abdomen Limited Ruq  06/10/2015  CLINICAL DATA:  Acute onset of right upper quadrant abdominal pain. Initial encounter. EXAM: US ABDOMEN LIMITED - RIGHT UPPER QUADRANT COMPARISON:  PET/CT performed 05/03/2015 FINDINGS: Gallbladder: Gallbladder wall thickening is nonspecific, given the patient's underlying ascites. Sludge is noted within the gallbladder. No definite stones are seen. No ultrasonographic Murphy's sign is elicited. Common bile duct: Diameter: 0.6 cm, within normal limits in caliber. Liver: No focal lesion identified. Diffusely increased parenchymal echogenicity noted. The nodular contour of the liver is compatible with hepatic cirrhosis. Moderate volume ascites is seen within all 4 quadrants of the abdomen. IMPRESSION: 1. No acute abnormality seen within the right upper quadrant. 2. Gallbladder wall thickening is nonspecific given the patient's  underlying ascites. Sludge within the gallbladder. No definite evidence for obstruction or cholecystitis. 3. Findings of hepatic cirrhosis. 4. Moderate volume ascites noted within the abdomen. Electronically Signed   By: Garald Balding M.D.   On: 06/10/2015 01:07    Review of Systems  Constitutional: Negative for fever and chills.  HENT: Negative for sore throat and tinnitus.   Eyes: Negative for blurred vision and redness.  Respiratory: Negative for cough and shortness of breath.   Cardiovascular: Positive for chest pain. Negative for palpitations, orthopnea and PND.  Gastrointestinal: Positive for vomiting. Negative for nausea, abdominal pain and diarrhea.  Genitourinary: Negative for dysuria, urgency and frequency.  Musculoskeletal: Negative for myalgias and joint pain.  Skin: Negative for rash.       No lesions  Neurological: Positive for  weakness. Negative for speech change and focal weakness.  Endo/Heme/Allergies: Does not bruise/bleed easily.       No temperature intolerance  Psychiatric/Behavioral: Negative for depression and suicidal ideas.    Blood pressure 132/73, pulse 85, temperature 98.1 F (36.7 C), temperature source Oral, resp. rate 22, height 5' 6"  (1.676 m), weight 86.183 kg (190 lb), SpO2 100 %. Physical Exam  Vitals reviewed. Constitutional: She is oriented to person, place, and time. She appears well-developed and well-nourished.  HENT:  Head: Normocephalic and atraumatic.  Mouth/Throat: Oropharynx is clear and moist.  Eyes: Conjunctivae and EOM are normal. Pupils are equal, round, and reactive to light. No scleral icterus.  Neck: Normal range of motion. Neck supple. No JVD present. No tracheal deviation present. No thyromegaly present.  Cardiovascular: Normal rate, regular rhythm and normal heart sounds.  Exam reveals no gallop and no friction rub.   No murmur heard. Respiratory: Effort normal and breath sounds normal. No respiratory distress. She has no wheezes.  GI: Soft. Bowel sounds are normal. She exhibits no distension. There is no tenderness.  Colostomy in place LLQ  Genitourinary:  Deferred  Musculoskeletal: Normal range of motion. She exhibits no edema.  Lymphadenopathy:    She has no cervical adenopathy.  Neurological: She is alert and oriented to person, place, and time. No cranial nerve deficit. She exhibits normal muscle tone.  Skin: Skin is warm and dry. No rash noted. No erythema.  Psychiatric: She has a normal mood and affect. Her behavior is normal. Judgment and thought content normal.     Assessment/Plan This is a 60 year old female admitted for weakness. 1. Weakness: Secondary to anemia and deconditioning. 2 units of packed red blood cells have been ordered for transfusion. PT and OT evaluation as well. 2. Chest pain: Noncardiac; reproducible left lateral chest wall. May be  related to lung cancer. Continue aspirin 3. Lung cancer: The patient is scheduled to undergo chemotherapy Thursday. 4. COPD: Continue her corticosteroid and Spiriva. Albuterol as needed 5. Bipolar depression: Continue Abilify and Lexapro 6. DVT prophylaxis: Eliquis (due to hypercoagulability associated with malignancy) 7. GI prophylaxis: None The patient is a full code area time spent on admission orders and patient care approximately 45 minutes  [name], MD 06/10/2015, 5:36 AM

## 2015-06-10 NOTE — ED Notes (Signed)
Pt provided with ginger ale and updated on admission process.

## 2015-06-10 NOTE — ED Notes (Signed)
Pt returned from ultrasound. Pt resting comfortable, readjusted in bed, tv channel changed. Ice chips provided.

## 2015-06-10 NOTE — Progress Notes (Signed)
[place] [facility] [place] NAME: [name]    MR#:  [id number]  DATE OF BIRTH:  [birth year]  SUBJECTIVE:  CHIEF COMPLAINT:  Patient with past medical history of rectal cancer status post colectomy came into the ED with a chief complaint of chest pain and generalized weakness Patient is still complaining of weakness today during my examination. Awaiting to get blood transfusion. Denies any shortness of breath or dizziness. Chest pain is resolved  REVIEW OF SYSTEMS:  CONSTITUTIONAL: No fever, fatigue .Reporting generalized weakness EYES: No blurred or double vision.  EARS, NOSE, AND THROAT: No tinnitus or ear pain.  RESPIRATORY: No cough, shortness of breath, wheezing or hemoptysis.  CARDIOVASCULAR: No chest pain, orthopnea, edema.  GASTROINTESTINAL: No nausea, vomiting, diarrhea or abdominal pain.  GENITOURINARY: No dysuria, hematuria.  ENDOCRINE: No polyuria, nocturia,  HEMATOLOGY: No anemia, easy bruising or bleeding SKIN: No rash or lesion. MUSCULOSKELETAL: No joint pain or arthritis.   NEUROLOGIC: No tingling, numbness, weakness.  PSYCHIATRY: No anxiety or depression.   DRUG ALLERGIES:   Allergies  Allergen Reactions  . Fish Allergy Anaphylaxis    Patient allergic to perch only. She can eat other fish.  . Sulfa Antibiotics Hives    VITALS:  Blood pressure 132/61, pulse 84, temperature 97.8 F (36.6 C), temperature source Oral, resp. rate 17, height '5\' 6"'$  (1.676 m), weight 85.639 kg (188 lb 12.8 oz), SpO2 96 %.  PHYSICAL EXAMINATION:  GENERAL:  60 y.o.-year-old patient lying in the bed with no acute distress.  EYES: Pupils equal, round, reactive to light and accommodation. No scleral icterus. Extraocular muscles intact.  HEENT: Head atraumatic, normocephalic. Oropharynx and nasopharynx clear.  NECK:  Supple, no jugular venous distention. No thyroid enlargement, no tenderness.  LUNGS: Normal breath sounds bilaterally, no  wheezing, rales,rhonchi or crepitation. No use of accessory muscles of respiration.  CARDIOVASCULAR: S1, S2 normal. No murmurs, rubs, or gallops.  ABDOMEN: Soft, nontender, nondistended. Bowel sounds present. No organomegaly or mass.  EXTREMITIES: No pedal edema, cyanosis, or clubbing.  NEUROLOGIC: Cranial nerves II through XII are intact. Muscle strength 5/5 in all extremities. Sensation intact. Gait not checked.  PSYCHIATRIC: The patient is alert and oriented x 3.  SKIN: No obvious rash, lesion, or ulcer.    LABORATORY PANEL:   CBC  Recent Labs Lab 06/09/15 2213  WBC 10.0  HGB 7.5*  HCT 23.6*  PLT 202   ------------------------------------------------------------------------------------------------------------------  Chemistries   Recent Labs Lab 06/09/15 2213  NA 135  K 3.2*  CL 101  CO2 30  GLUCOSE 122*  BUN 14  CREATININE 0.94  CALCIUM 10.8*  AST 54*  ALT 13*  ALKPHOS 122  BILITOT 0.8   ------------------------------------------------------------------------------------------------------------------  Cardiac Enzymes  Recent Labs Lab 06/09/15 2213  TROPONINI <0.03   ------------------------------------------------------------------------------------------------------------------  RADIOLOGY:  Dg Chest 1 View  06/09/2015  CLINICAL DATA:  Shortness of breath for 4 days. History of right pleural biopsies and pleurodesis. EXAM: CHEST 1 VIEW COMPARISON:  05/24/2015 and prior exams FINDINGS: Cardiomediastinal silhouette is unchanged. A right Port-A-Cath is noted with tip overlying the mid SVC. Right basilar/ pleural opacity again noted. There is no evidence of pneumothorax or significant change from the prior study. IMPRESSION: No evidence of acute abnormality. Continued right basilar/ pleural opacity. Electronically Signed   By: Margarette Canada M.D.   On: 06/09/2015 22:41   US Abdomen Limited Ruq  06/10/2015  CLINICAL DATA:  Acute onset of right upper quadrant  abdominal pain.  Initial encounter. EXAM: US ABDOMEN LIMITED - RIGHT UPPER QUADRANT COMPARISON:  PET/CT performed 05/03/2015 FINDINGS: Gallbladder: Gallbladder wall thickening is nonspecific, given the patient's underlying ascites. Sludge is noted within the gallbladder. No definite stones are seen. No ultrasonographic Murphy's sign is elicited. Common bile duct: Diameter: 0.6 cm, within normal limits in caliber. Liver: No focal lesion identified. Diffusely increased parenchymal echogenicity noted. The nodular contour of the liver is compatible with hepatic cirrhosis. Moderate volume ascites is seen within all 4 quadrants of the abdomen. IMPRESSION: 1. No acute abnormality seen within the right upper quadrant. 2. Gallbladder wall thickening is nonspecific given the patient's underlying ascites. Sludge within the gallbladder. No definite evidence for obstruction or cholecystitis. 3. Findings of hepatic cirrhosis. 4. Moderate volume ascites noted within the abdomen. Electronically Signed   By: Garald Balding M.D.   On: 06/10/2015 01:07    EKG:   Orders placed or performed during the hospital encounter of 06/09/15  . EKG 12-Lead  . EKG 12-Lead  . ED EKG  . ED EKG  . EKG 12-Lead  . EKG 12-Lead   *Note: Due to a large number of results and/or encounters for the requested time period, some results have not been displayed. A complete set of results can be found in Results Review.    ASSESSMENT AND PLAN:    This is a 60 year old female admitted for weakness.  1. Weakness: Secondary to anemia and deconditioning. 2 units of packed red blood cells have been ordered for transfusion. PT and OT evaluation is pending  2. Chest pain: Noncardiac; acute MI ruled out with 2 negative sets of troponins reproducible left lateral chest wall. May be related to lung cancer. Continue aspirin  3. Lung cancer: The patient is scheduled to undergo chemotherapy Thursday.  4. COPD: Continue her corticosteroid and  Spiriva. Albuterol as needed  5. Bipolar depression: Continue Abilify and Lexapro  6. DVT prophylaxis: Eliquis (due to hypercoagulability associated with malignancy)  7. GI prophylaxis: None    All the records are reviewed and case discussed with Care Management/Social Workerr. Management plans discussed with the patient, family and they are in agreement.  CODE STATUS: FC  TOTAL TIME TAKING CARE OF THIS PATIENT: 36 minutes.   POSSIBLE D/C IN 2  DAYS, DEPENDING ON CLINICAL CONDITION.   Nicholes Mango M.D on 06/10/2015 at 12:43 PM  Between 7am to 6pm - Pager - [phone] After 6pm go to [email or web address] - password [email or web address] [facility]  Office  [phone]  CC: Primary care physician; [name], NP

## 2015-06-10 NOTE — ED Notes (Signed)
Attempted to ambulate pt with difficulty. Pt only able to walk two steps with oxygen before tiring and stating she could not "make it any further." md notified and in to assess pt. Pt denies need to urinate at this time.

## 2015-06-10 NOTE — ED Notes (Signed)
Report to [place], rn.

## 2015-06-10 NOTE — Plan of Care (Signed)
Problem: Discharge Progression Outcomes Goal: Pain controlled with appropriate interventions Outcome: Progressing Percocet given once for chronic back pain with improvement. Goal: Tolerating diet Outcome: Progressing Poor appetite. Pt encouraged to eat and drink. Pt drinks Ensures. Denies n/v. Goal: Activity appropriate for discharge plan Outcome: Progressing Generalized weakness. Lethargic, lexapro held. Pt oriented x4. Goal: Hemodynamically stable Outcome: Progressing Hgb 7.5 in am. 2xunits of blood administered. 2nd blood transfusion currently transfusing. No transfusion reaction noted. CBC pending tomorrow am.

## 2015-06-10 NOTE — ED Notes (Signed)
Spoke with dr. Beather Arbour regarding blood administration. Dr. Beather Arbour states pt may receive type specific blood after admission to inpatient unit.

## 2015-06-10 NOTE — ED Notes (Signed)
Patient transported to Ultrasound 

## 2015-06-10 NOTE — Care Management Obs Status (Signed)
[facility] NOTIFICATION   Patient Details  Name: [name] MRN: [id number] Date of Birth: 07-29-1955   Medicare Observation Status Notification Given:  Yes (blood transfusion)    Ival Bible, RN 06/10/2015, 5:58 AM

## 2015-06-10 NOTE — ED Notes (Signed)
Pt readjusted in bed for comfort. Bedside table at bedside. Pt reports improved pain.

## 2015-06-10 NOTE — Progress Notes (Signed)
PT Cancellation Note  Patient Details Name: [name] MRN: [id number] DOB: 04/30/1955   Cancelled Treatment:    Reason Eval/Treat Not Completed: Medical issues which prohibited therapy. Patient was getting blood transfusion.  Alanson Puls, PT, DPT  Caspian, Minette Headland S 06/10/2015, 11:59 AM

## 2015-06-11 ENCOUNTER — Other Ambulatory Visit: Payer: Self-pay | Admitting: Hematology and Oncology

## 2015-06-11 LAB — TYPE AND SCREEN
ABO/RH(D): A POS
Antibody Screen: NEGATIVE
UNIT DIVISION: 0
UNIT DIVISION: 0
Unit division: 0

## 2015-06-11 LAB — CBC
HEMATOCRIT: 30.1 % — AB (ref 35.0–47.0)
Hemoglobin: 9.9 g/dL — ABNORMAL LOW (ref 12.0–16.0)
MCH: 29 pg (ref 26.0–34.0)
MCHC: 32.7 g/dL (ref 32.0–36.0)
MCV: 88.4 fL (ref 80.0–100.0)
PLATELETS: 155 10*3/uL (ref 150–440)
RBC: 3.4 MIL/uL — ABNORMAL LOW (ref 3.80–5.20)
RDW: 17.8 % — AB (ref 11.5–14.5)
WBC: 8.7 10*3/uL (ref 3.6–11.0)

## 2015-06-11 LAB — BASIC METABOLIC PANEL
Anion gap: 4 — ABNORMAL LOW (ref 5–15)
BUN: 15 mg/dL (ref 6–20)
CALCIUM: 10.9 mg/dL — AB (ref 8.9–10.3)
CO2: 31 mmol/L (ref 22–32)
Chloride: 101 mmol/L (ref 101–111)
Creatinine, Ser: 0.85 mg/dL (ref 0.44–1.00)
GFR calc Af Amer: >60 mL/min (ref >60)
GLUCOSE: 99 mg/dL (ref 65–99)
POTASSIUM: 3.1 mmol/L — AB (ref 3.5–5.1)
SODIUM: 136 mmol/L (ref 135–145)

## 2015-06-11 LAB — GLUCOSE, CAPILLARY
Glucose-Capillary: 96 mg/dL (ref 65–99)
Glucose-Capillary: 83 mg/dL (ref 65–99)

## 2015-06-11 MED ORDER — POTASSIUM CHLORIDE CRYS ER 20 MEQ PO TBCR
40.0000 meq | EXTENDED_RELEASE_TABLET | Freq: Once | ORAL | Status: AC
  Administered 2015-06-11: 40 meq via ORAL
  Filled 2015-06-11: qty 2

## 2015-06-11 MED ORDER — DIPHENHYDRAMINE HCL 50 MG/ML IJ SOLN
12.5000 mg | Freq: Once | INTRAMUSCULAR | Status: AC
  Administered 2015-06-11: 12.5 mg via INTRAVENOUS
  Filled 2015-06-11: qty 1

## 2015-06-11 NOTE — Progress Notes (Signed)
Spoke to Quita Skye (patients friend) regarding discharge. Quita Skye is to try and contact daughters for discharge and call back. Madlyn Frankel, RN

## 2015-06-11 NOTE — Progress Notes (Signed)
Patient c/o heartburn/indigestion, requesting tums or maalox. Order for Maalox entered per standing admission order. Maalox given, pt reports relief.

## 2015-06-11 NOTE — Progress Notes (Signed)
Attempted to contact daughter Stanton Kidney to arrange discharge. No answer - left message. Daughter April's number is unknown. Patient unable to recall. MD aware. Madlyn Frankel, RN

## 2015-06-11 NOTE — Telephone Encounter (Signed)
Called and left patient a voicemail asking for her to please return my call. This was the 3rd attempt at reaching the patient so I will close this encounter.

## 2015-06-11 NOTE — Progress Notes (Signed)
Spoke to April - patients daughter. April is coming to pick up patient. Madlyn Frankel, RN

## 2015-06-11 NOTE — Consult Note (Signed)
   [facility] CM Inpatient Consult   06/11/2015  [name] 09-12-1955 [id number]  Patient is currently active with [facility] for chronic disease management services.  Patient has been engaged by a [employer] and CSW.  Our community based plan of care has focused on disease management and community resource support.  Patient will receive a post discharge transition of care call and will be evaluated for monthly home visits for assessments and disease process education. Patient had discharged to home, before I could visit her in her room. Of note, [facility] Care Management services does not replace or interfere with any services that are needed or arranged by inpatient case management or social work.  For additional questions or referrals please contact:  Moise Friday RN, [facility] Liaison  [phone]) [place] [phone]) Toll free office

## 2015-06-11 NOTE — Progress Notes (Signed)
Discharge instructions given and went over with patient and daughter at bedside. All questions answered. Patient discharged home with daughter via wheelchair by nursing staff. Madlyn Frankel, RN

## 2015-06-11 NOTE — Progress Notes (Signed)
Pt had a chest tube her last admission and sutures are still in place and causing the patient to have itching at site.  Pt c/o itching. Dr. Marcille Blanco notified and new order entered by MD for Benadryl.

## 2015-06-11 NOTE — Evaluation (Signed)
Physical Therapy Evaluation Patient Details Name: [name] MRN: [id number] DOB: 1955-03-02 Today's Date: 06/11/2015   History of Present Illness  60 yo F presented to ED for increased SOB, admitted for weakness. She currently has R sided lung cancer. PMH includes rectal cancer s/o colectomy, schizophrenia, bipolar disorder, PTSD, fibromylagia, and portacath placement. Pt has has multiple recent hospitalizations.  Clinical Impression  Pt demonstrated significant weakness and difficulty walking due to decline in health recently. She fatigues with minimal activity, requiring frequent therapeutic rest breaks for energy conservation. Her strength is grossly 3+/5 and balance is fair. Pt is limited to ambulation of ~5 ft with FWW and min A. STR is recommended after hospital discharge to address deficits of strength, endurance, balance and gait to progress towards PLOF before returning home. Pt will benefit from skilled PT services to increase functional I and mobility for safe discharge.     Follow Up Recommendations SNF    Equipment Recommendations  None recommended by PT (pt has recommended FWW)    Recommendations for Other Services       Precautions / Restrictions Precautions Precautions: Fall Restrictions Weight Bearing Restrictions: No      Mobility  Bed Mobility Overal bed mobility: Needs Assistance Bed Mobility: Supine to Sit     Supine to sit: Min assist;HOB elevated     General bed mobility comments: difficulty getting trunk upright  Transfers Overall transfer level: Needs assistance Equipment used: Rolling walker (2 wheeled) Transfers: Sit to/from [facility] Sit to Stand: Min assist;From elevated surface Stand pivot transfers: Min assist;From elevated surface       General transfer comment: cues for hand placement, fatigues quickly  Ambulation/Gait Ambulation/Gait assistance: Min assist Ambulation Distance (Feet): 5 Feet Assistive  device: Rolling walker (2 wheeled) Gait Pattern/deviations: Step-to pattern;Narrow base of support;Trunk flexed Gait velocity: reduced Gait velocity interpretation: Below normal speed for age/gender General Gait Details: Very slow, somewhat unsteady as pt fatigues very quickly.  Stairs            Wheelchair Mobility    Modified Rankin (Stroke Patients Only)       Balance Overall balance assessment: Needs assistance Sitting-balance support: Bilateral upper extremity supported;Feet supported Sitting balance-Leahy Scale: Fair Sitting balance - Comments: poor trunk strength   Standing balance support: Bilateral upper extremity supported Standing balance-Leahy Scale: Fair Standing balance comment: limited by weakness and fatigue                             Pertinent Vitals/Pain Pain Assessment: 0-10 Pain Score: 5  Pain Location: back Pain Descriptors / Indicators: Aching Pain Intervention(s): Limited activity within patient's tolerance;Monitored during session;Repositioned    Home Living Family/patient expects to be discharged to:: Private residence Living Arrangements: Children Available Help at Discharge: Family Type of Home: House Home Access: Stairs to enter Entrance Stairs-Rails: None Technical brewer of Steps: 3 Home Layout: One level Home Equipment: Environmental consultant - 2 wheels      Prior Function Level of Independence: Needs assistance   Gait / Transfers Assistance Needed: very limited household ambulation with FWW  ADL's / Homemaking Assistance Needed: assistance from family for all ADLs  Comments: pt has had a decline in her health, requiring more assistance     Hand Dominance        Extremity/Trunk Assessment   Upper Extremity Assessment: Generalized weakness           Lower Extremity Assessment: Generalized weakness  Communication   Communication: No difficulties  Cognition Arousal/Alertness: Lethargic Behavior  During Therapy: Flat affect Overall Cognitive Status: No family/caregiver present to determine baseline cognitive functioning                      General Comments General comments (skin integrity, edema, etc.): 2L O2, pale, lethargic    Exercises Other Exercises Other Exercises: B LE supine therex: ankle pumps, heel slides, hip abd slides x10 each. Increased time required to perform as pt fatigued quickly, requinging therapeutic rest breaks for energy conservation. Pt also falling asleep and requiring frequent cues for staying alert and on task.      Assessment/Plan    PT Assessment Patient needs continued PT services  PT Diagnosis Difficulty walking;Generalized weakness   PT Problem List Decreased strength;Decreased activity tolerance;Decreased balance;Decreased mobility;Decreased cognition;Decreased safety awareness;Cardiopulmonary status limiting activity  PT Treatment Interventions Gait training;Stair training;Therapeutic activities;Therapeutic exercise;Balance training;Neuromuscular re-education;Patient/family education   PT Goals (Current goals can be found in the Care Plan section) Acute Rehab PT Goals Patient Stated Goal: to get stronger PT Goal Formulation: With patient Time For Goal Achievement: 06/25/15 Potential to Achieve Goals: Fair    Frequency Min 2X/week   Barriers to discharge Inaccessible home environment;Decreased caregiver support stairs to enter, pt needs 24 hour care    Co-evaluation               End of Session Equipment Utilized During Treatment: Gait belt;Oxygen Activity Tolerance: Patient limited by lethargy;Patient limited by fatigue Patient left: in chair;with call bell/phone within reach;with chair alarm set Nurse Communication: Mobility status    Functional Assessment Tool Used: clinical judgement, gait speed Functional Limitation: Mobility: Walking and moving around Mobility: Walking and Moving Around Current Status (I9485): At  least 40 percent but less than 60 percent impaired, limited or restricted Mobility: Walking and Moving Around Goal Status [phone]): At least 1 percent but less than 20 percent impaired, limited or restricted    Time: 3500-9381 PT Time Calculation (min) (ACUTE ONLY): 28 min   Charges:   PT Evaluation $PT Eval Moderate Complexity: 1 Procedure PT Treatments $Therapeutic Exercise: 8-22 mins   PT G Codes:   PT G-Codes **NOT FOR INPATIENT CLASS** Functional Assessment Tool Used: clinical judgement, gait speed Functional Limitation: Mobility: Walking and moving around Mobility: Walking and Moving Around Current Status (W2993): At least 40 percent but less than 60 percent impaired, limited or restricted Mobility: Walking and Moving Around Goal Status [phone]): At least 1 percent but less than 20 percent impaired, limited or restricted    Neoma Laming, PT, DPT  06/11/2015, 9:46 AM [phone]

## 2015-06-11 NOTE — Care Management (Signed)
Admitted to [facility] with the diagnosis of weakness under observation status. Lives with daughter, April Woods. Friend is Quita Skye [phone]). Last seen at [facility] 2 weeks ago. Chronic home oxygen 2 liters per nasal cannula continously x 2 months per [place].  No skilled facility. Decreased appetite for a while. Did have [employer] in the home. Uses a cane to aid in ambulation. Followed by [facility] in the home. States she feeds herself, but needs some help with dressing and bathing.  Discharge to home today per Dr. Margaretmary Eddy. Will be followed by [facility] in the home. Daughter will transport. Shelbie Ammons RN MSN CCM Care Management [phone]

## 2015-06-11 NOTE — Discharge Instructions (Signed)
Activity as tolerated per PT recommendations Regular diet Follow-up with primary care physician and oncology as scheduled are within a week

## 2015-06-11 NOTE — Discharge Summary (Signed)
[facility] NAME: [name]    MR#:  [id number]  DATE OF BIRTH:  29-Feb-1955  DATE OF ADMISSION:  06/09/2015 ADMITTING PHYSICIAN: [name], MD  DATE OF DISCHARGE: 06/11/15  PRIMARY CARE PHYSICIAN: [name], NP    ADMISSION DIAGNOSIS:  Generalized weakness [R53.1] Symptomatic anemia [D64.9] Acute on chronic respiratory failure, unspecified whether with hypoxia or hypercapnia (HCC) [J96.20] Malignant neoplasm of lung, unspecified laterality, unspecified part of lung ([place]) [C34.90] Pulmonary emphysema, unspecified emphysema type ([place]) [J43.9]  DISCHARGE DIAGNOSIS:  Active Problems:   Weakness  Normocytic anemia SECONDARY DIAGNOSIS:   Past Medical History  Diagnosis Date  . Schizophrenia ([place])   . Asthma   . GERD (gastroesophageal reflux disease)   . Anxiety   . Depression   . Bipolar disorder ([place])   . COPD (chronic obstructive pulmonary disease) ([place])   . Occasional tremors     right hand  . PTSD (post-traumatic stress disorder)   . Shortness of breath dyspnea   . Fibromyalgia   . DVT (deep venous thrombosis) ([place]) 2011    RUE  . Thyroid nodule   . DDD (degenerative disc disease), lumbar   . Spinal stenosis   . Peripheral neuropathy ([place])   . Rotator cuff tear     right  . Pneumonia 2011  . Hypothyroidism     no meds currently  . Anemia     during pregnancy only  . Hypertension     Off meds x 15 years-well controlled now per pt  . Severe obstructive sleep apnea 06/27/2014  . DVT of upper extremity (deep vein thrombosis) ([place]) 10/14/2014  . Squamous cell cancer, anus (HCC)   . Lung cancer ([place])   . Hemorrhoid 06/27/2014  . SBO (small bowel obstruction) [facility])     HOSPITAL COURSE:   Patient with past medical history of rectal cancer status post colectomy came into the ED with a chief complaint of chest pain and generalized weakness.Please review history and physical for complete  details   This is a 60 year old female admitted for weakness.  1. Weakness: Secondary to anemia and deconditioning. 2 units of packed red blood cells given have been ordered for transfusion. PT and OT evaluation and recommended skilled nursing facility. Patient and family requesting home with home health. Patient's hemoglobin is better after blood transfusion 2 units , is  9.9 today,Anemia is normocytic and MCV is at 88.4 Patient's anemia might be from chronic aspirin and a request intake which might cause occult bleed, also patient has history of lung cancer. PCP can consider doing further workup as needed basis. Iron supplements started  2. Chest pain: Noncardiac; acute MI ruled out with 2 negative sets of troponins reproducible left lateral chest wall. May be related to lung cancer. Continue aspirin  3. Lung cancer: The patient is scheduled to undergo chemotherapy Thursday.  4. COPD: Continue her corticosteroid and Spiriva. Albuterol as needed  5. Bipolar depression: Continue Abilify and Cymbalta. Patient is also on Lexapro which is discontinued during the hospital course. I see no reason to continue both Lexapro and Cymbalta and have discontinued Lexapro. PCP to follow-up on this  6. DVT prophylaxis: Eliquis 2.5 mg by mouth to be continued (due to hypercoagulability associated with malignancy) as benefits outweigh the risks  7. GI prophylaxis: None  8. Hypokalemia repleted potassium   DISCHARGE CONDITIONS:   fair  CONSULTS OBTAINED:      PROCEDURES none  DRUG  ALLERGIES:   Allergies  Allergen Reactions  . Fish Allergy Anaphylaxis    Patient allergic to perch only. She can eat other fish.  . Sulfa Antibiotics Hives    DISCHARGE MEDICATIONS:   Current Discharge Medication List    CONTINUE these medications which have NOT CHANGED   Details  albuterol (PROVENTIL) (2.5 MG/3ML) 0.083% nebulizer solution Take 3 mLs (2.5 mg total) by nebulization every 4 (four) hours. Qty:  360 vial, Refills: 12    apixaban (ELIQUIS) 2.5 MG TABS tablet Take 1 tablet (2.5 mg total) by mouth 2 (two) times daily. Qty: 60 tablet, Refills: 3    ARIPiprazole (ABILIFY) 2 MG tablet Take 1 tablet (2 mg total) by mouth daily. Qty: 30 tablet, Refills: 4    aspirin EC 81 MG EC tablet Take 1 tablet (81 mg total) by mouth daily. Qty: 30 tablet, Refills: 1    budesonide-formoterol (SYMBICORT) 160-4.5 MCG/ACT inhaler Inhale 2 puffs into the lungs 2 (two) times daily. Qty: 1 Inhaler, Refills: 12    cholecalciferol 1000 units tablet Take 1 tablet (1,000 Units total) by mouth daily. Qty: 30 tablet, Refills: 0    clonazePAM (KLONOPIN) 1 MG tablet Take 1 tablet (1 mg total) by mouth 3 (three) times daily as needed for anxiety. Qty: 90 tablet, Refills: 4    docusate sodium (COLACE) 100 MG capsule Take 100 mg by mouth 2 (two) times daily.    DULoxetine (CYMBALTA) 60 MG capsule Take 60 mg by mouth every morning.     ENSURE (ENSURE) Take 237 mLs by mouth 2 (two) times daily between meals. Reported on 05/14/2015   Associated Diagnoses: Squamous cell cancer, anus ([place]); Anemia, unspecified anemia type; Localized swelling, mass, or lump of lower extremity, bilateral; B12 deficiency    ferrous sulfate 325 (65 FE) MG tablet Take 1 tablet (325 mg total) by mouth 2 (two) times daily with a meal. Qty: 60 tablet, Refills: 0    folic acid (FOLVITE) 1 MG tablet Take 1 tablet (1 mg total) by mouth daily. Qty: 30 tablet, Refills: 3    LYRICA 200 MG capsule Take 200 mg by mouth 2 (two) times daily as needed (pain). Reported on 05/15/2015    nicotine (NICODERM CQ - DOSED IN MG/24 HOURS) 21 mg/24hr patch Place 1 patch (21 mg total) onto the skin daily. Qty: 28 patch, Refills: 0    nystatin (MYCOSTATIN) 100000 UNIT/ML suspension Take 5 mLs (500,000 Units total) by mouth 4 (four) times daily. Qty: 60 mL, Refills: 2    ondansetron (ZOFRAN) 4 MG tablet Take 1 tablet (4 mg total) by mouth every 6 (six) hours as  needed for nausea. Qty: 20 tablet, Refills: 0    oxyCODONE-acetaminophen (PERCOCET) 10-325 MG tablet Take 1 tablet by mouth every 6 (six) hours as needed. Reported on 04/30/2015 Qty: 112 tablet, Refills: 0    pantoprazole (PROTONIX) 40 MG tablet Take 40 mg by mouth 2 (two) times daily.     polyethylene glycol (MIRALAX / GLYCOLAX) packet Take 17 g by mouth daily. Qty: 14 each, Refills: 0    protein supplement (RESOURCE BENEPROTEIN) POWD Take 1 scoop by mouth 2 (two) times daily between meals.   Associated Diagnoses: Squamous cell cancer, anus ([place]); Anemia, unspecified anemia type; Localized swelling, mass, or lump of lower extremity, bilateral; B12 deficiency    tiotropium (SPIRIVA) 18 MCG inhalation capsule Place 1 capsule (18 mcg total) into inhaler and inhale daily. Qty: 30 capsule, Refills: 12    VENTOLIN HFA 108 (90  Base) MCG/ACT inhaler Inhale 2 puffs into the lungs every 4 (four) hours as needed for wheezing. Reported on 05/14/2015    vitamin B-12 (CYANOCOBALAMIN) 1000 MCG tablet Take 1,000 mcg by mouth daily. Reported on 05/14/2015   Associated Diagnoses: Squamous cell cancer, anus ([place]); Anemia, unspecified anemia type; Localized swelling, mass, or lump of lower extremity, bilateral; B12 deficiency    zolpidem (AMBIEN) 10 MG tablet Take 1 tablet (10 mg total) by mouth at bedtime as needed for sleep. Qty: 30 tablet, Refills: 4      STOP taking these medications     escitalopram (LEXAPRO) 20 MG tablet          DISCHARGE INSTRUCTIONS:   Activity as tolerated per PT recommendations Regular diet Follow-up with primary care physician and oncology as scheduled are within a week  DIET:  Regular diet  DISCHARGE CONDITION:  Fair   ACTIVITY:  Activity as tolerated Per PT recommendations  OXYGEN:  Home Oxygen: Yes.     Oxygen Delivery: 2 liters/min via Patient connected to nasal cannula oxygen  DISCHARGE LOCATION:  home With home health  If you experience worsening  of your admission symptoms, develop shortness of breath, life threatening emergency, suicidal or homicidal thoughts you must seek medical attention immediately by calling 911 or calling your MD immediately  if symptoms less severe.  You Must read complete instructions/literature along with all the possible adverse reactions/side effects for all the Medicines you take and that have been prescribed to you. Take any new Medicines after you have completely understood and accpet all the possible adverse reactions/side effects.   Please note  You were cared for by a hospitalist during your hospital stay. If you have any questions about your discharge medications or the care you received while you were in the hospital after you are discharged, you can call the unit and asked to speak with the hospitalist on call if the hospitalist that took care of you is not available. Once you are discharged, your primary care physician will handle any further medical issues. Please note that NO REFILLS for any discharge medications will be authorized once you are discharged, as it is imperative that you return to your primary care physician (or establish a relationship with a primary care physician if you do not have one) for your aftercare needs so that they can reassess your need for medications and monitor your lab values.     Today  Chief Complaint  Patient presents with  . Shortness of Breath   Patient is feeling fine today more awake and alert. Lives on 2 L of home oxygen chronically. Prefers going home with home health. Daughters April is agreeable wants to take her home  ROS:  CONSTITUTIONAL: Denies fevers, chills. Denies any fatigue, weakness.  EYES: Denies blurry vision, double vision, eye pain. EARS, NOSE, THROAT: Denies tinnitus, ear pain, hearing loss. RESPIRATORY: Denies cough, wheeze, shortness of breath.  CARDIOVASCULAR: Denies chest pain, palpitations, edema.  GASTROINTESTINAL: Denies nausea,  vomiting, diarrhea, abdominal pain. Denies bright red blood per rectum. GENITOURINARY: Denies dysuria, hematuria. ENDOCRINE: Denies nocturia or thyroid problems. HEMATOLOGIC AND LYMPHATIC: Denies easy bruising or bleeding. SKIN: Denies rash or lesion. MUSCULOSKELETAL: Denies pain in neck, back, shoulder, knees, hips or arthritic symptoms.  NEUROLOGIC: Denies paralysis, paresthesias.  PSYCHIATRIC: Denies anxiety or depressive symptoms.   VITAL SIGNS:  Blood pressure 135/76, pulse 91, temperature 97.5 F (36.4 C), temperature source Oral, resp. rate 18, height '5\' 6"'$  (1.676 m), weight 91.853 kg (202  lb 8 oz), SpO2 99 %.  I/O:    Intake/Output Summary (Last 24 hours) at 06/11/15 1314 Last data filed at 06/11/15 0745  Gross per 24 hour  Intake    936 ml  Output    450 ml  Net    486 ml    PHYSICAL EXAMINATION:  GENERAL:  60 y.o.-year-old patient lying in the bed with no acute distress.  EYES: Pupils equal, round, reactive to light and accommodation. No scleral icterus. Extraocular muscles intact.  HEENT: Head atraumatic, normocephalic. Oropharynx and nasopharynx clear.  NECK:  Supple, no jugular venous distention. No thyroid enlargement, no tenderness.  LUNGS: Normal breath sounds bilaterally, no wheezing, rales,rhonchi or crepitation. No use of accessory muscles of respiration.  CARDIOVASCULAR: S1, S2 normal. No murmurs, rubs, or gallops.  ABDOMEN: Soft, non-tender, non-distended. Bowel sounds present. No organomegaly or mass.  EXTREMITIES: No pedal edema, cyanosis, or clubbing.  NEUROLOGIC: Cranial nerves II through XII are intact. Muscle strength 5/5 in all extremities. Sensation intact. Gait not checked.  PSYCHIATRIC: The patient is alert and oriented x 3.  SKIN: No obvious rash, lesion, or ulcer.   DATA REVIEW:   CBC  Recent Labs Lab 06/11/15 0441  WBC 8.7  HGB 9.9*  HCT 30.1*  PLT 155    Chemistries   Recent Labs Lab 06/09/15 2213 06/11/15 0441  NA 135 136   K 3.2* 3.1*  CL 101 101  CO2 30 31  GLUCOSE 122* 99  BUN 14 15  CREATININE 0.94 0.85  CALCIUM 10.8* 10.9*  AST 54*  --   ALT 13*  --   ALKPHOS 122  --   BILITOT 0.8  --     Cardiac Enzymes  Recent Labs Lab 06/09/15 2213  TROPONINI <0.03    Microbiology Results  Results for orders placed or performed during the hospital encounter of 04/17/15  Urine culture     Status: Abnormal   Collection Time: 04/17/15 10:42 PM  Result Value Ref Range Status   Specimen Description URINE, CLEAN CATCH  Final   Special Requests NONE  Final   Culture (A)  Final    >=100,000 COLONIES/mL KLEBSIELLA PNEUMONIAE >=100,000 COLONIES/mL ESCHERICHIA COLI    Report Status 04/21/2015 FINAL  Final   Organism ID, Bacteria ESCHERICHIA COLI (A)  Final   Organism ID, Bacteria KLEBSIELLA PNEUMONIAE (A)  Final      Susceptibility   Escherichia coli - MIC*    AMPICILLIN >=32 RESISTANT Resistant     CEFAZOLIN <=4 SENSITIVE Sensitive     CEFTRIAXONE <=1 SENSITIVE Sensitive     CIPROFLOXACIN 1 SENSITIVE Sensitive     GENTAMICIN <=1 SENSITIVE Sensitive     IMIPENEM <=0.25 SENSITIVE Sensitive     NITROFURANTOIN <=16 SENSITIVE Sensitive     TRIMETH/SULFA <=20 SENSITIVE Sensitive     AMPICILLIN/SULBACTAM >=32 RESISTANT Resistant     PIP/TAZO <=4 SENSITIVE Sensitive     Extended ESBL NEGATIVE Sensitive     * >=100,000 COLONIES/mL ESCHERICHIA COLI   Klebsiella pneumoniae - MIC*    Extended ESBL NEGATIVE Sensitive     AMPICILLIN/SULBACTAM Value in next row Intermediate      INTERMEDIATE16    PIP/TAZO Value in next row Sensitive      SENSITIVE16    CEFTRIAXONE Value in next row Sensitive      SENSITIVE<=1    IMIPENEM Value in next row Sensitive      SENSITIVE1    GENTAMICIN Value in next row Sensitive  SENSITIVE<=1    CIPROFLOXACIN Value in next row Sensitive      SENSITIVE<=0.25    NITROFURANTOIN Value in next row Intermediate      INTERMEDIATE64    TRIMETH/SULFA Value in next row Sensitive       SENSITIVE<=20    * >=100,000 COLONIES/mL KLEBSIELLA PNEUMONIAE   *Note: Due to a large number of results and/or encounters for the requested time period, some results have not been displayed. A complete set of results can be found in Results Review.    RADIOLOGY:  Dg Chest 1 View  06/09/2015  CLINICAL DATA:  Shortness of breath for 4 days. History of right pleural biopsies and pleurodesis. EXAM: CHEST 1 VIEW COMPARISON:  05/24/2015 and prior exams FINDINGS: Cardiomediastinal silhouette is unchanged. A right Port-A-Cath is noted with tip overlying the mid SVC. Right basilar/ pleural opacity again noted. There is no evidence of pneumothorax or significant change from the prior study. IMPRESSION: No evidence of acute abnormality. Continued right basilar/ pleural opacity. Electronically Signed   By: Margarette Canada M.D.   On: 06/09/2015 22:41   US Abdomen Limited Ruq  06/10/2015  CLINICAL DATA:  Acute onset of right upper quadrant abdominal pain. Initial encounter. EXAM: US ABDOMEN LIMITED - RIGHT UPPER QUADRANT COMPARISON:  PET/CT performed 05/03/2015 FINDINGS: Gallbladder: Gallbladder wall thickening is nonspecific, given the patient's underlying ascites. Sludge is noted within the gallbladder. No definite stones are seen. No ultrasonographic Murphy's sign is elicited. Common bile duct: Diameter: 0.6 cm, within normal limits in caliber. Liver: No focal lesion identified. Diffusely increased parenchymal echogenicity noted. The nodular contour of the liver is compatible with hepatic cirrhosis. Moderate volume ascites is seen within all 4 quadrants of the abdomen. IMPRESSION: 1. No acute abnormality seen within the right upper quadrant. 2. Gallbladder wall thickening is nonspecific given the patient's underlying ascites. Sludge within the gallbladder. No definite evidence for obstruction or cholecystitis. 3. Findings of hepatic cirrhosis. 4. Moderate volume ascites noted within the abdomen. Electronically Signed    By: Garald Balding M.D.   On: 06/10/2015 01:07    EKG:   Orders placed or performed during the hospital encounter of 06/09/15  . EKG 12-Lead  . EKG 12-Lead  . ED EKG  . ED EKG  . EKG 12-Lead  . EKG 12-Lead   *Note: Due to a large number of results and/or encounters for the requested time period, some results have not been displayed. A complete set of results can be found in Results Review.      Management plans discussed with the patient, family and they are in agreement.  CODE STATUS:     Code Status Orders        Start     Ordered   06/10/15 0618  Full code   Continuous     06/10/15 0617    Code Status History    Date Active Date Inactive Code Status Order ID Comments User Context   05/24/2015  2:39 PM 05/25/2015  3:49 PM Full Code [id number]  Fritzi Mandes, MD ED   04/23/2015 12:40 AM 04/24/2015  7:02 PM Full Code [id number]  Max Sane, MD Inpatient   04/18/2015  2:43 AM 04/21/2015  9:09 PM Full Code [id number]  [name], MD Inpatient   04/08/2015  2:01 PM 04/17/2015  7:16 PM Full Code [id number]  Idelle Crouch, MD ED   03/16/2015  1:41 AM 03/18/2015  6:23 PM Full Code [id number]  Lance Coon, MD ED   03/05/2015  6:17 AM 03/07/2015  8:09 PM Full Code [id number]  [name], MD Inpatient   02/26/2015  5:43 PM 03/03/2015  6:54 PM Full Code [id number]  Bettey Costa, MD Inpatient   10/14/2014 10:59 PM 10/17/2014  4:34 PM Full Code [id number]  Lytle Butte, MD ED   05/17/2014  8:38 PM 05/30/2014  4:55 PM Full Code [id number]  Fritzi Mandes, MD Inpatient      TOTAL TIME TAKING CARE OF THIS PATIENT: 47 minutes.    '@[facility]'$ @  on 06/11/2015 at 1:14 PM  Between 7am to 6pm - Pager - [phone]  After 6pm go to [email or web address] - password [email or web address] [facility]  Office  [phone]  CC: Primary care physician; [name], NP

## 2015-06-12 ENCOUNTER — Other Ambulatory Visit: Payer: Self-pay | Admitting: *Deleted

## 2015-06-12 ENCOUNTER — Inpatient Hospital Stay: Payer: Commercial Managed Care - HMO

## 2015-06-12 ENCOUNTER — Emergency Department: Payer: Commercial Managed Care - HMO

## 2015-06-12 ENCOUNTER — Inpatient Hospital Stay: Payer: Commercial Managed Care - HMO | Admitting: Hematology and Oncology

## 2015-06-12 ENCOUNTER — Inpatient Hospital Stay
Admission: EM | Admit: 2015-06-12 | Discharge: 2015-06-16 | DRG: 178 | Disposition: A | Discharge status: Home / Self Care | Payer: Commercial Managed Care - HMO | Attending: Internal Medicine | Admitting: Internal Medicine

## 2015-06-12 DIAGNOSIS — M797 Fibromyalgia: Secondary | ICD-10-CM | POA: Diagnosis present

## 2015-06-12 DIAGNOSIS — Z823 Family history of stroke: Secondary | ICD-10-CM

## 2015-06-12 DIAGNOSIS — Z933 Colostomy status: Secondary | ICD-10-CM

## 2015-06-12 DIAGNOSIS — J90 Pleural effusion, not elsewhere classified: Secondary | ICD-10-CM | POA: Insufficient documentation

## 2015-06-12 DIAGNOSIS — K219 Gastro-esophageal reflux disease without esophagitis: Secondary | ICD-10-CM | POA: Diagnosis present

## 2015-06-12 DIAGNOSIS — C210 Malignant neoplasm of anus, unspecified: Secondary | ICD-10-CM | POA: Diagnosis present

## 2015-06-12 DIAGNOSIS — C3490 Malignant neoplasm of unspecified part of unspecified bronchus or lung: Secondary | ICD-10-CM | POA: Diagnosis present

## 2015-06-12 DIAGNOSIS — Z9889 Other specified postprocedural states: Secondary | ICD-10-CM

## 2015-06-12 DIAGNOSIS — D649 Anemia, unspecified: Secondary | ICD-10-CM | POA: Diagnosis present

## 2015-06-12 DIAGNOSIS — Z66 Do not resuscitate: Secondary | ICD-10-CM | POA: Diagnosis present

## 2015-06-12 DIAGNOSIS — E039 Hypothyroidism, unspecified: Secondary | ICD-10-CM | POA: Diagnosis present

## 2015-06-12 DIAGNOSIS — M5136 Other intervertebral disc degeneration, lumbar region: Secondary | ICD-10-CM | POA: Diagnosis present

## 2015-06-12 DIAGNOSIS — Z8249 Family history of ischemic heart disease and other diseases of the circulatory system: Secondary | ICD-10-CM

## 2015-06-12 DIAGNOSIS — Z841 Family history of disorders of kidney and ureter: Secondary | ICD-10-CM

## 2015-06-12 DIAGNOSIS — C20 Malignant neoplasm of rectum: Secondary | ICD-10-CM | POA: Diagnosis present

## 2015-06-12 DIAGNOSIS — Z882 Allergy status to sulfonamides status: Secondary | ICD-10-CM

## 2015-06-12 DIAGNOSIS — R0602 Shortness of breath: Secondary | ICD-10-CM

## 2015-06-12 DIAGNOSIS — Z7401 Bed confinement status: Secondary | ICD-10-CM

## 2015-06-12 DIAGNOSIS — J910 Malignant pleural effusion: Secondary | ICD-10-CM | POA: Diagnosis present

## 2015-06-12 DIAGNOSIS — I10 Essential (primary) hypertension: Secondary | ICD-10-CM | POA: Diagnosis present

## 2015-06-12 DIAGNOSIS — J449 Chronic obstructive pulmonary disease, unspecified: Secondary | ICD-10-CM | POA: Diagnosis present

## 2015-06-12 DIAGNOSIS — J869 Pyothorax without fistula: Principal | ICD-10-CM

## 2015-06-12 DIAGNOSIS — Z9851 Tubal ligation status: Secondary | ICD-10-CM

## 2015-06-12 DIAGNOSIS — G4733 Obstructive sleep apnea (adult) (pediatric): Secondary | ICD-10-CM | POA: Diagnosis present

## 2015-06-12 DIAGNOSIS — Z8701 Personal history of pneumonia (recurrent): Secondary | ICD-10-CM

## 2015-06-12 DIAGNOSIS — R188 Other ascites: Secondary | ICD-10-CM

## 2015-06-12 DIAGNOSIS — Z87891 Personal history of nicotine dependence: Secondary | ICD-10-CM

## 2015-06-12 DIAGNOSIS — Z86718 Personal history of other venous thrombosis and embolism: Secondary | ICD-10-CM

## 2015-06-12 DIAGNOSIS — R109 Unspecified abdominal pain: Secondary | ICD-10-CM

## 2015-06-12 DIAGNOSIS — Z85048 Personal history of other malignant neoplasm of rectum, rectosigmoid junction, and anus: Secondary | ICD-10-CM

## 2015-06-12 DIAGNOSIS — Z9049 Acquired absence of other specified parts of digestive tract: Secondary | ICD-10-CM

## 2015-06-12 DIAGNOSIS — G629 Polyneuropathy, unspecified: Secondary | ICD-10-CM | POA: Diagnosis present

## 2015-06-12 DIAGNOSIS — Z833 Family history of diabetes mellitus: Secondary | ICD-10-CM

## 2015-06-12 DIAGNOSIS — Z888 Allergy status to other drugs, medicaments and biological substances status: Secondary | ICD-10-CM

## 2015-06-12 DIAGNOSIS — F319 Bipolar disorder, unspecified: Secondary | ICD-10-CM | POA: Diagnosis present

## 2015-06-12 DIAGNOSIS — Z818 Family history of other mental and behavioral disorders: Secondary | ICD-10-CM

## 2015-06-12 DIAGNOSIS — Z809 Family history of malignant neoplasm, unspecified: Secondary | ICD-10-CM

## 2015-06-12 LAB — CBC WITH DIFFERENTIAL/PLATELET
BASOS ABS: 0.1 10*3/uL (ref 0–0.1)
Basophils Relative: 1 %
Eosinophils Absolute: 1 10*3/uL — ABNORMAL HIGH (ref 0–0.7)
Eosinophils Relative: 10 %
HEMATOCRIT: 29.7 % — AB (ref 35.0–47.0)
HEMOGLOBIN: 9.9 g/dL — AB (ref 12.0–16.0)
LYMPHS PCT: 9 %
Lymphs Abs: 0.9 10*3/uL — ABNORMAL LOW (ref 1.0–3.6)
MCH: 30.4 pg (ref 26.0–34.0)
MCHC: 33.3 g/dL (ref 32.0–36.0)
MCV: 91.3 fL (ref 80.0–100.0)
MONO ABS: 0.5 10*3/uL (ref 0.2–0.9)
MONOS PCT: 5 %
NEUTROS ABS: 7.5 10*3/uL — AB (ref 1.4–6.5)
NEUTROS PCT: 75 %
Platelets: 168 10*3/uL (ref 150–440)
RBC: 3.25 MIL/uL — ABNORMAL LOW (ref 3.80–5.20)
RDW: 18 % — AB (ref 11.5–14.5)
WBC: 10.1 10*3/uL (ref 3.6–11.0)

## 2015-06-12 LAB — URINALYSIS COMPLETE WITH MICROSCOPIC (ARMC ONLY)
BACTERIA UA: NONE SEEN
Bilirubin Urine: NEGATIVE
Glucose, UA: NEGATIVE mg/dL
KETONES UR: NEGATIVE mg/dL
NITRITE: NEGATIVE
PH: 5 (ref 5.0–8.0)
PROTEIN: 30 mg/dL — AB
SPECIFIC GRAVITY, URINE: 1.027 (ref 1.005–1.030)

## 2015-06-12 LAB — COMPREHENSIVE METABOLIC PANEL
ALBUMIN: 2 g/dL — AB (ref 3.5–5.0)
ALK PHOS: 160 U/L — AB (ref 38–126)
ALT: 15 U/L (ref 14–54)
AST: 57 U/L — AB (ref 15–41)
Anion gap: 5 (ref 5–15)
BUN: 18 mg/dL (ref 6–20)
CALCIUM: 11.6 mg/dL — AB (ref 8.9–10.3)
CO2: 28 mmol/L (ref 22–32)
CREATININE: 0.98 mg/dL (ref 0.44–1.00)
Chloride: 101 mmol/L (ref 101–111)
GFR calc Af Amer: >60 mL/min (ref >60)
GFR calc non Af Amer: >60 mL/min (ref >60)
GLUCOSE: 91 mg/dL (ref 65–99)
Potassium: 4.1 mmol/L (ref 3.5–5.1)
SODIUM: 134 mmol/L — AB (ref 135–145)
Total Bilirubin: 1 mg/dL (ref 0.3–1.2)
Total Protein: 5.8 g/dL — ABNORMAL LOW (ref 6.5–8.1)

## 2015-06-12 LAB — LIPASE, BLOOD: Lipase: 68 U/L — ABNORMAL HIGH (ref 11–51)

## 2015-06-12 MED ORDER — IOPAMIDOL (ISOVUE-300) INJECTION 61%
100.0000 mL | Freq: Once | INTRAVENOUS | Status: AC | PRN
  Administered 2015-06-12: 100 mL via INTRAVENOUS

## 2015-06-12 MED ORDER — DIATRIZOATE MEGLUMINE & SODIUM 66-10 % PO SOLN
15.0000 mL | Freq: Once | ORAL | Status: AC
  Administered 2015-06-12: 15 mL via ORAL

## 2015-06-12 NOTE — ED Notes (Signed)
Pt bib EMS w/ c/o R side abd pain that radiates to back.  Pt sts that she was DC'd yesterday from [facility].  Pt sts that she was admitted bc she couldn't stand up/walk.  Pt sts that she has had 3 frac vertebra for last 2 years.  Pt denies n/v/d.  Pt sts that she has been hallucinating, none observed by EMS or this RN.

## 2015-06-12 NOTE — Patient Outreach (Signed)
Transition of care call (week 1, discharged 6/5- 8 admissions in 6 months).  Spoke with daughter Amy, reports not a good day to talk to pt, confused, lying down, dealing with pain.   HIPPA verified on pt  as daughter reports pt is staying with her (verified pt's dob, address). Amy reports Cancer MD told pt to  call ambulance tomorrow if she  continues with pain.   As discussed with daughter, plan to call pt again tomorrow, check on status.    RN CM  to f/u with  [facility].       [name]   [facility]  [phone]

## 2015-06-12 NOTE — ED Provider Notes (Addendum)
[facility] Emergency Department Provider Note   ____________________________________________  Time seen: Approximately 10:56 PM  I have reviewed the triage vital signs and the nursing notes.   HISTORY  Chief Complaint Abdominal Pain   HPI [name] is a 60 y.o. female who was discharged from the hospital yesterday after being admitted 2 days ago. Patient complains of abdominal pain especially in the right side. She cannot really describe it besides pain. It appears to have started sometime today. Appears to be moderate to severe. Nothing seems to make it better or worse. She complains about her back pain and she's had for 2 years.   Past Medical History  Diagnosis Date  . Schizophrenia ([place])   . Asthma   . GERD (gastroesophageal reflux disease)   . Anxiety   . Depression   . Bipolar disorder ([place])   . COPD (chronic obstructive pulmonary disease) ([place])   . Occasional tremors     right hand  . PTSD (post-traumatic stress disorder)   . Shortness of breath dyspnea   . Fibromyalgia   . DVT (deep venous thrombosis) ([place]) 2011    RUE  . Thyroid nodule   . DDD (degenerative disc disease), lumbar   . Spinal stenosis   . Peripheral neuropathy ([place])   . Rotator cuff tear     right  . Pneumonia 2011  . Hypothyroidism     no meds currently  . Anemia     during pregnancy only  . Hypertension     Off meds x 15 years-well controlled now per pt  . Severe obstructive sleep apnea 06/27/2014  . DVT of upper extremity (deep vein thrombosis) ([place]) 10/14/2014  . Squamous cell cancer, anus (HCC)   . Lung cancer ([place])   . Hemorrhoid 06/27/2014  . SBO (small bowel obstruction) [facility])     Patient Active Problem List   Diagnosis Date Noted  . Weakness 06/10/2015  . Sensorineural hearing loss of both ears 05/24/2015  . Respiratory failure, acute and chronic ([place]) 05/24/2015  . Impaired mobility 05/22/2015  . Malnutrition ([place]) 05/03/2015  . Lung  cancer ([place]) 05/01/2015  . Osteoporosis 05/01/2015  . Chronic low back pain 05/01/2015  . Radicular pain of thoracic region 05/01/2015  . Lumbar facet arthropathy 05/01/2015  . Lumbar facet syndrome 05/01/2015  . Thoracic spondylosis 05/01/2015  . Lumbar spondylosis 05/01/2015  . T12 compression fracture ([place]) (Since 2013) 05/01/2015  . T6 compression fracture (since 2015) 05/01/2015  . Chronic pain 04/30/2015  . Long term current use of opiate analgesic 04/30/2015  . Long term prescription opiate use 04/30/2015  . Opiate use (60 MME/Day) 04/30/2015  . Encounter for therapeutic drug level monitoring 04/30/2015  . Cancer-related pain 04/30/2015  . Compression fracture of thoracic vertebra (HCC) (T6, T9, T10, and T12) 04/30/2015  . Weakness generalized 04/30/2015  . No appetite 04/30/2015  . Generalized weakness 04/22/2015  . Recurrent right pleural effusion 04/08/2015  . Hospital discharge follow-up 04/03/2015  . Recurrent pneumonia   . RUQ abdominal pain   . Sepsis ([place]) 03/15/2015  . CAP (community acquired pneumonia) 03/15/2015  . Pyrexia   . Coarse tremors 03/14/2015  . Insomnia 03/14/2015  . Poor mobility 03/14/2015  . HCAP (healthcare-associated pneumonia) 03/05/2015  . 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 GI bleed 03/02/2015  . Anemia   . Aspiration pneumonitis ([place]) 02/27/2015  . Small bowel obstruction ([place]) 02/27/2015  . Hyperglycemia 02/27/2015  . Chronic  pain syndrome 02/27/2015  . COPD (chronic obstructive pulmonary disease) ([place]) 02/26/2015  . Fibromyalgia 12/12/2014  . B12 deficiency 09/28/2014  . Folate deficiency 09/28/2014  . Macrocytosis 09/21/2014  . Depression   . Hypokalemia 07/20/2014  . Anal cancer ([place]) 06/27/2014  . Anal fissure 06/27/2014  . Anxiety 06/27/2014  . Airway hyperreactivity 06/27/2014  . H/O manic depressive disorder 06/27/2014  . CAFL (chronic airflow limitation) ([place]) 06/27/2014  . Clinical  depression 06/27/2014  . Deep vein thrombosis ([place]) 06/27/2014  . External hemorrhoid 06/27/2014  . Myalgia and myositis 06/27/2014  . GERD (gastroesophageal reflux disease) 06/27/2014  . HTN (hypertension) 06/27/2014  . HLD (hyperlipidemia) 06/27/2014  . Adult hypothyroidism 06/27/2014  . Mass of perianal area 06/27/2014  . 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 Ulcerated hemorrhoid 06/27/2014  . Severe obstructive sleep apnea 06/27/2014  . Squamous cell cancer, anus (HCC)   . Rectal ulcer 05/17/2014    Past Surgical History  Procedure Laterality Date  . Foot surgery Right   . Tubal ligation    . Eye surgery Bilateral   . Mouth surgery  2002  . Rectal biopsy N/A 05/08/2014    Procedure: BIOPSY RECTAL;  Surgeon: Marlyce Huge, MD;  Location: [facility] ORS;  Service: General;  Laterality: N/A;  . Evaluation under anesthesia with hemorrhoidectomy N/A 05/08/2014    Procedure: EXAM UNDER ANESTHESIA WITH HEMORRHOIDECTOMY;  Surgeon: Marlyce Huge, MD;  Location: [facility] ORS;  Service: General;  Laterality: N/A;  . Portacath placement N/A 05/20/2014    Procedure: INSERTION PORT-A-CATH;  Surgeon: Florene Glen, MD;  Location: [facility] ORS;  Service: General;  Laterality: N/A;  . Laparoscopic diverted colostomy N/A 05/26/2014    Procedure: LAPAROSCOPIC DIVERTED COLOSTOMY;  Surgeon: Marlyce Huge, MD;  Location: [facility] ORS;  Service: General;  Laterality: N/A;  . Peripheral vascular catheterization Left 10/16/2014    Procedure: Upper Extremity Venography with thrombectomy, port removal;  Surgeon: Algernon Huxley, MD;  Location: [place] CV LAB;  Service: Cardiovascular;  Laterality: Left;  . Peripheral vascular catheterization  10/16/2014    Procedure: Upper Extremity Intervention;  Surgeon: Algernon Huxley, MD;  Location: [place] CV LAB;  Service: Cardiovascular;;  . Video assisted thoracoscopy (vats)/thorocotomy Right 04/10/2015    Procedure: PRE-OP BRONCHOSCOPY, VIDEO ASSISTED  THORACOSCOPY RIGHT WITH PLEURAL BIOPSIES, & PLEURAL DESIS;  Surgeon: Nestor Lewandowsky, MD;  Location: [facility] ORS;  Service: Thoracic;  Laterality: Right;  . Esophagogastroduodenoscopy N/A 04/19/2015    Procedure: ESOPHAGOGASTRODUODENOSCOPY (EGD);  Surgeon: Hulen Luster, MD;  Location: [facility] ENDOSCOPY;  Service: Endoscopy;  Laterality: N/A;  . Colonoscopy N/A 04/19/2015    Procedure: COLONOSCOPY;  Surgeon: Hulen Luster, MD;  Location: [facility] ENDOSCOPY;  Service: Endoscopy;  Laterality: N/A;  . Peripheral vascular catheterization N/A 05/14/2015    Procedure: Porta Cath Insertion;  Surgeon: Algernon Huxley, MD;  Location: [place] CV LAB;  Service: Cardiovascular;  Laterality: N/A;    Current Outpatient Rx  Name  Route  Sig  Dispense  Refill  . albuterol (PROVENTIL) (2.5 MG/3ML) 0.083% nebulizer solution   Nebulization   Take 3 mLs (2.5 mg total) by nebulization every 4 (four) hours. Patient taking differently: Take 2.5 mg by nebulization every 4 (four) hours as needed for wheezing or shortness of breath.    360 vial   12   . apixaban (ELIQUIS) 2.5 MG TABS tablet   Oral   Take 1 tablet (2.5 mg total) by mouth 2 (two) times daily.   60 tablet  3     Pt has hx of clot after portacath being put in. Sh ...   . ARIPiprazole (ABILIFY) 2 MG tablet   Oral   Take 1 tablet (2 mg total) by mouth daily.   30 tablet   4   . aspirin EC 81 MG EC tablet   Oral   Take 1 tablet (81 mg total) by mouth daily.   30 tablet   1   . budesonide-formoterol (SYMBICORT) 160-4.5 MCG/ACT inhaler   Inhalation   Inhale 2 puffs into the lungs 2 (two) times daily.   1 Inhaler   12   . cholecalciferol 1000 units tablet   Oral   Take 1 tablet (1,000 Units total) by mouth daily.   30 tablet   0   . clonazePAM (KLONOPIN) 1 MG tablet   Oral   Take 1 tablet (1 mg total) by mouth 3 (three) times daily as needed for anxiety.   90 tablet   4   . docusate sodium (COLACE) 100 MG capsule   Oral   Take 100 mg by mouth 2  (two) times daily.         . DULoxetine (CYMBALTA) 60 MG capsule   Oral   Take 60 mg by mouth every morning.          Marland Kitchen ENSURE (ENSURE)   Oral   Take 237 mLs by mouth 2 (two) times daily between meals. Reported on 05/14/2015         . ferrous sulfate 325 (65 FE) MG tablet   Oral   Take 1 tablet (325 mg total) by mouth 2 (two) times daily with a meal.   60 tablet   0   . folic acid (FOLVITE) 1 MG tablet   Oral   Take 1 tablet (1 mg total) by mouth daily.   30 tablet   3   . LYRICA 200 MG capsule   Oral   Take 200 mg by mouth 2 (two) times daily as needed (pain). Reported on 05/15/2015           Dispense as written.   . nicotine (NICODERM CQ - DOSED IN MG/24 HOURS) 21 mg/24hr patch   Transdermal   Place 1 patch (21 mg total) onto the skin daily.   28 patch   0   . nystatin (MYCOSTATIN) 100000 UNIT/ML suspension   Oral   Take 5 mLs (500,000 Units total) by mouth 4 (four) times daily.   60 mL   2   . ondansetron (ZOFRAN) 4 MG tablet   Oral   Take 1 tablet (4 mg total) by mouth every 6 (six) hours as needed for nausea.   20 tablet   0   . oxyCODONE-acetaminophen (PERCOCET) 10-325 MG tablet   Oral   Take 1 tablet by mouth every 6 (six) hours as needed. Reported on 04/30/2015   112 tablet   0   . pantoprazole (PROTONIX) 40 MG tablet   Oral   Take 40 mg by mouth 2 (two) times daily.          . polyethylene glycol (MIRALAX / GLYCOLAX) packet   Oral   Take 17 g by mouth daily.   14 each   0   . protein supplement (RESOURCE BENEPROTEIN) POWD   Oral   Take 1 scoop by mouth 2 (two) times daily between meals.         . tiotropium (SPIRIVA) 18 MCG inhalation capsule   Inhalation  Place 1 capsule (18 mcg total) into inhaler and inhale daily.   30 capsule   12   . VENTOLIN HFA 108 (90 Base) MCG/ACT inhaler   Inhalation   Inhale 2 puffs into the lungs every 4 (four) hours as needed for wheezing. Reported on 05/14/2015           Dispense as written.     . vitamin B-12 (CYANOCOBALAMIN) 1000 MCG tablet   Oral   Take 1,000 mcg by mouth daily. Reported on 05/14/2015         . zolpidem (AMBIEN) 10 MG tablet   Oral   Take 1 tablet (10 mg total) by mouth at bedtime as needed for sleep.   30 tablet   4     Allergies Fish allergy and Sulfa antibiotics  Family History  Problem Relation Age of Onset  . Cancer Maternal Aunt   . Cancer Paternal Uncle   . Diabetes Mother   . Thyroid disease Mother   . Kidney failure Mother   . Hypertension Mother   . Depression Mother   . Mental illness Son   . Aneurysm Maternal Grandmother   . Thyroid disease Maternal Grandmother   . Stroke Maternal Grandfather   . Hypertension Maternal Grandfather   . Diabetes Maternal Grandfather   . Heart disease Maternal Grandfather     MI  . Nephrolithiasis Daughter     Social History Social History  Substance Use Topics  . Smoking status: Former Smoker -- 0.10 packs/day for 44 years    Types: Cigarettes    Start date: 10/04/1970    Quit date: 03/26/2015  . Smokeless tobacco: Never Used  . Alcohol Use: No     Comment: 1 drink every 2-3 times/year    Review of Systems Constitutional: No fever/chills Eyes: No visual changes. ENT: No sore throat. Cardiovascular: Denies chest pain. Respiratory: Denies shortness of breath. Gastrointestinal: See history of present illness Genitourinary: Negative for dysuria. Musculoskeletal: Negative for back pain. Skin: Negative for rash. Neurological: Negative for headaches, focal weakness or numbness.  10-point ROS otherwise negative.  ____________________________________________   PHYSICAL EXAM:  VITAL SIGNS: ED Triage Vitals  Enc Vitals Group     BP 06/12/15 2209 142/73 mmHg     Pulse Rate 06/12/15 2209 74     Resp 06/12/15 2209 18     Temp 06/12/15 2209 98 F (36.7 C)     Temp Source 06/12/15 2209 Oral     SpO2 06/12/15 2209 99 %     Weight --      Height --      Head Cir --      Peak Flow --       Pain Score 06/12/15 2209 8     Pain Loc --      Pain Edu? --      Excl. in [place]? --     Constitutional: Alert and oriented. Well appearing and in no acute distress. Eyes: Conjunctivae are normal. PERRL. EOMI. Head: Atraumatic. Nose: No congestion/rhinnorhea. Mouth/Throat: Mucous membranes are moist.  Oropharynx non-erythematous. Neck: No stridor. Cardiovascular: Normal rate, regular rhythm. Grossly normal heart sounds.  Good peripheral circulation. Respiratory: Normal respiratory effort.  No retractions. Lungs CTAB. Gastrointestinal: Soft diffusely tender to palpation and percussion. More tender to percussion on the right side. Bowel sounds are negative. No distention. No abdominal bruits. No CVA tenderness. }Musculoskeletal: No lower extremity tenderness nor edema.  No joint effusions. Neurologic:  Normal speech and language. No gross focal  neurologic deficits are appreciated. No gait instability. Skin:  Skin is warm, dry and intact. No rash noted. Psychiatric: Mood and affect are normal. Speech and behavior are normal.  ____________________________________________   LABS (all labs ordered are listed, but only abnormal results are displayed)  Labs Reviewed  CBC WITH DIFFERENTIAL/PLATELET - Abnormal; Notable for the following:    RBC 3.25 (*)    Hemoglobin 9.9 (*)    HCT 29.7 (*)    RDW 18.0 (*)    Neutro Abs 7.5 (*)    Lymphs Abs 0.9 (*)    Eosinophils Absolute 1.0 (*)    All other components within normal limits  URINALYSIS COMPLETEWITH MICROSCOPIC ([facility] ONLY) - Abnormal; Notable for the following:    Color, Urine AMBER (*)    APPearance HAZY (*)    Hgb urine dipstick 2+ (*)    Protein, ur 30 (*)    Leukocytes, UA TRACE (*)    Squamous Epithelial / LPF 0-5 (*)    All other components within normal limits  COMPREHENSIVE METABOLIC PANEL  LIPASE, BLOOD    ____________________________________________  EKG  Pending ____________________________________________  RADIOLOGY  Pending ____________________________________________   PROCEDURES   ____________________________________________   INITIAL IMPRESSION / ASSESSMENT AND PLAN / ED COURSE  Pertinent labs & imaging results that were available during my care of the patient were reviewed by me and considered in my medical decision making (see chart for details).  CT and labs are pending. I will sign the patient out to Dr. Owens Shark. ____________________________________________   FINAL CLINICAL IMPRESSION(S) / ED DIAGNOSES  Final diagnoses:  Abdominal pain, unspecified abdominal location      NEW MEDICATIONS STARTED DURING THIS VISIT:  New Prescriptions   No medications on file     Note:  This document was prepared using Dragon voice recognition software and may include unintentional dictation errors.    Nena Polio, MD 06/12/15 0737  Nena Polio, MD 06/12/15 [phone]

## 2015-06-12 NOTE — Patient Outreach (Signed)
This RN CM spoke with  Antonietta Breach [facility] RN from [facility] (following pt post discharge), discussed  Difficulty of RN CM contacting pt in the past, call today with daughter.     Sherri reports pt is new to her, saw her twice, most recent yesterday for colostomy change.  Sherri reports pt was confused yesterday, spoke with daughter today - pt was suppose to get chemo today but did not go because of pain.   Sherri reports daughter shared with her caring for pt is getting overwhelming to which Sherri discussed benefits of pt having Hospice (cancer terminal), need to f/u with Primary Care MD.   As discussed, Sherri to provide RN CM with updates, when discharging.    Plan to f/u with pt again tomorrow telephonically.   [name]   [facility] [facility]  [phone]

## 2015-06-13 ENCOUNTER — Ambulatory Visit: Payer: Self-pay | Admitting: *Deleted

## 2015-06-13 DIAGNOSIS — R188 Other ascites: Secondary | ICD-10-CM | POA: Diagnosis present

## 2015-06-13 DIAGNOSIS — Z833 Family history of diabetes mellitus: Secondary | ICD-10-CM | POA: Diagnosis not present

## 2015-06-13 DIAGNOSIS — Z882 Allergy status to sulfonamides status: Secondary | ICD-10-CM | POA: Diagnosis not present

## 2015-06-13 DIAGNOSIS — J90 Pleural effusion, not elsewhere classified: Secondary | ICD-10-CM | POA: Insufficient documentation

## 2015-06-13 DIAGNOSIS — Z933 Colostomy status: Secondary | ICD-10-CM | POA: Diagnosis not present

## 2015-06-13 DIAGNOSIS — M5136 Other intervertebral disc degeneration, lumbar region: Secondary | ICD-10-CM | POA: Diagnosis present

## 2015-06-13 DIAGNOSIS — Z809 Family history of malignant neoplasm, unspecified: Secondary | ICD-10-CM | POA: Diagnosis not present

## 2015-06-13 DIAGNOSIS — Z9889 Other specified postprocedural states: Secondary | ICD-10-CM | POA: Diagnosis not present

## 2015-06-13 DIAGNOSIS — Z9049 Acquired absence of other specified parts of digestive tract: Secondary | ICD-10-CM | POA: Diagnosis not present

## 2015-06-13 DIAGNOSIS — K219 Gastro-esophageal reflux disease without esophagitis: Secondary | ICD-10-CM | POA: Diagnosis present

## 2015-06-13 DIAGNOSIS — Z8249 Family history of ischemic heart disease and other diseases of the circulatory system: Secondary | ICD-10-CM | POA: Diagnosis not present

## 2015-06-13 DIAGNOSIS — I10 Essential (primary) hypertension: Secondary | ICD-10-CM | POA: Diagnosis present

## 2015-06-13 DIAGNOSIS — Z86718 Personal history of other venous thrombosis and embolism: Secondary | ICD-10-CM | POA: Diagnosis not present

## 2015-06-13 DIAGNOSIS — J449 Chronic obstructive pulmonary disease, unspecified: Secondary | ICD-10-CM | POA: Diagnosis present

## 2015-06-13 DIAGNOSIS — J910 Malignant pleural effusion: Secondary | ICD-10-CM | POA: Diagnosis present

## 2015-06-13 DIAGNOSIS — Z888 Allergy status to other drugs, medicaments and biological substances status: Secondary | ICD-10-CM | POA: Diagnosis not present

## 2015-06-13 DIAGNOSIS — R0789 Other chest pain: Secondary | ICD-10-CM | POA: Diagnosis not present

## 2015-06-13 DIAGNOSIS — G4733 Obstructive sleep apnea (adult) (pediatric): Secondary | ICD-10-CM | POA: Diagnosis present

## 2015-06-13 DIAGNOSIS — F319 Bipolar disorder, unspecified: Secondary | ICD-10-CM | POA: Diagnosis present

## 2015-06-13 DIAGNOSIS — D649 Anemia, unspecified: Secondary | ICD-10-CM | POA: Diagnosis present

## 2015-06-13 DIAGNOSIS — C20 Malignant neoplasm of rectum: Secondary | ICD-10-CM | POA: Diagnosis present

## 2015-06-13 DIAGNOSIS — E039 Hypothyroidism, unspecified: Secondary | ICD-10-CM | POA: Diagnosis present

## 2015-06-13 DIAGNOSIS — Z87891 Personal history of nicotine dependence: Secondary | ICD-10-CM | POA: Diagnosis not present

## 2015-06-13 DIAGNOSIS — C210 Malignant neoplasm of anus, unspecified: Secondary | ICD-10-CM | POA: Diagnosis present

## 2015-06-13 DIAGNOSIS — Z66 Do not resuscitate: Secondary | ICD-10-CM | POA: Diagnosis present

## 2015-06-13 DIAGNOSIS — Z7401 Bed confinement status: Secondary | ICD-10-CM | POA: Diagnosis not present

## 2015-06-13 DIAGNOSIS — Z9851 Tubal ligation status: Secondary | ICD-10-CM | POA: Diagnosis not present

## 2015-06-13 DIAGNOSIS — Z8701 Personal history of pneumonia (recurrent): Secondary | ICD-10-CM | POA: Diagnosis not present

## 2015-06-13 DIAGNOSIS — R5383 Other fatigue: Secondary | ICD-10-CM | POA: Diagnosis not present

## 2015-06-13 DIAGNOSIS — Z85048 Personal history of other malignant neoplasm of rectum, rectosigmoid junction, and anus: Secondary | ICD-10-CM | POA: Diagnosis not present

## 2015-06-13 DIAGNOSIS — M797 Fibromyalgia: Secondary | ICD-10-CM | POA: Diagnosis present

## 2015-06-13 DIAGNOSIS — Z818 Family history of other mental and behavioral disorders: Secondary | ICD-10-CM | POA: Diagnosis not present

## 2015-06-13 DIAGNOSIS — Z823 Family history of stroke: Secondary | ICD-10-CM | POA: Diagnosis not present

## 2015-06-13 DIAGNOSIS — G629 Polyneuropathy, unspecified: Secondary | ICD-10-CM | POA: Diagnosis present

## 2015-06-13 DIAGNOSIS — J869 Pyothorax without fistula: Secondary | ICD-10-CM | POA: Diagnosis present

## 2015-06-13 DIAGNOSIS — C3490 Malignant neoplasm of unspecified part of unspecified bronchus or lung: Secondary | ICD-10-CM | POA: Diagnosis present

## 2015-06-13 DIAGNOSIS — Z841 Family history of disorders of kidney and ureter: Secondary | ICD-10-CM | POA: Diagnosis not present

## 2015-06-13 MED ORDER — ONDANSETRON HCL 4 MG PO TABS: 4.0000 mg | ORAL_TABLET | Freq: Four times a day (QID) | ORAL | Status: DC | PRN

## 2015-06-13 MED ORDER — POLYETHYLENE GLYCOL 3350 17 G PO PACK
17.0000 g | PACK | Freq: Every day | ORAL | Status: DC
  Administered 2015-06-13 – 2015-06-16 (×3): 17 g via ORAL
  Filled 2015-06-13 (×3): qty 1

## 2015-06-13 MED ORDER — NICOTINE 21 MG/24HR TD PT24
21.0000 mg | MEDICATED_PATCH | Freq: Every day | TRANSDERMAL | Status: DC
  Administered 2015-06-13 – 2015-06-15 (×3): 21 mg via TRANSDERMAL
  Filled 2015-06-13 (×4): qty 1

## 2015-06-13 MED ORDER — OXYCODONE-ACETAMINOPHEN 10-325 MG PO TABS: 1.0000 | ORAL_TABLET | Freq: Four times a day (QID) | ORAL | Status: DC | PRN

## 2015-06-13 MED ORDER — VITAMIN B-12 1000 MCG PO TABS
1000.0000 ug | ORAL_TABLET | Freq: Every day | ORAL | Status: DC
  Administered 2015-06-13 – 2015-06-16 (×4): 1000 ug via ORAL
  Filled 2015-06-13 (×4): qty 1

## 2015-06-13 MED ORDER — ACETAMINOPHEN 325 MG PO TABS: 325.0000 mg | ORAL_TABLET | Freq: Four times a day (QID) | ORAL | Status: DC | PRN

## 2015-06-13 MED ORDER — NYSTATIN 100000 UNIT/ML MT SUSP
5.0000 mL | Freq: Four times a day (QID) | OROMUCOSAL | Status: DC
  Administered 2015-06-13 – 2015-06-16 (×10): 500000 [IU] via ORAL
  Filled 2015-06-13 (×10): qty 5

## 2015-06-13 MED ORDER — SODIUM CHLORIDE 0.9% FLUSH
3.0000 mL | Freq: Two times a day (BID) | INTRAVENOUS | Status: DC
  Administered 2015-06-13 – 2015-06-15 (×6): 3 mL via INTRAVENOUS

## 2015-06-13 MED ORDER — TIOTROPIUM BROMIDE MONOHYDRATE 18 MCG IN CAPS
18.0000 ug | ORAL_CAPSULE | Freq: Every day | RESPIRATORY_TRACT | Status: DC
  Administered 2015-06-13 – 2015-06-16 (×4): 18 ug via RESPIRATORY_TRACT
  Filled 2015-06-13: qty 5

## 2015-06-13 MED ORDER — FOLIC ACID 1 MG PO TABS
1.0000 mg | ORAL_TABLET | Freq: Every day | ORAL | Status: DC
  Administered 2015-06-13 – 2015-06-16 (×4): 1 mg via ORAL
  Filled 2015-06-13 (×4): qty 1

## 2015-06-13 MED ORDER — ACETAMINOPHEN 650 MG RE SUPP: 650.0000 mg | Freq: Four times a day (QID) | RECTAL | Status: DC | PRN

## 2015-06-13 MED ORDER — ALBUTEROL SULFATE (2.5 MG/3ML) 0.083% IN NEBU: 2.5000 mg | INHALATION_SOLUTION | RESPIRATORY_TRACT | Status: DC | PRN

## 2015-06-13 MED ORDER — HYDROMORPHONE HCL 1 MG/ML IJ SOLN
0.5000 mg | INTRAMUSCULAR | Status: DC | PRN
  Administered 2015-06-13 – 2015-06-15 (×4): 0.5 mg via INTRAVENOUS
  Filled 2015-06-13 (×4): qty 1

## 2015-06-13 MED ORDER — ONDANSETRON HCL 4 MG/2ML IJ SOLN: 4.0000 mg | Freq: Four times a day (QID) | INTRAMUSCULAR | Status: DC | PRN

## 2015-06-13 MED ORDER — CLONAZEPAM 1 MG PO TABS: 1.0000 mg | ORAL_TABLET | Freq: Three times a day (TID) | ORAL | Status: DC | PRN

## 2015-06-13 MED ORDER — ENSURE ENLIVE PO LIQD
237.0000 mL | Freq: Two times a day (BID) | ORAL | Status: DC
  Administered 2015-06-13 – 2015-06-16 (×7): 237 mL via ORAL

## 2015-06-13 MED ORDER — PANTOPRAZOLE SODIUM 40 MG PO TBEC
40.0000 mg | DELAYED_RELEASE_TABLET | Freq: Two times a day (BID) | ORAL | Status: DC
  Administered 2015-06-13 – 2015-06-16 (×7): 40 mg via ORAL
  Filled 2015-06-13 (×7): qty 1

## 2015-06-13 MED ORDER — BENEPROTEIN PO POWD
1.0000 | Freq: Two times a day (BID) | ORAL | Status: DC
  Filled 2015-06-13: qty 227

## 2015-06-13 MED ORDER — DULOXETINE HCL 60 MG PO CPEP
60.0000 mg | ORAL_CAPSULE | ORAL | Status: DC
  Administered 2015-06-13 – 2015-06-16 (×4): 60 mg via ORAL
  Filled 2015-06-13 (×4): qty 1

## 2015-06-13 MED ORDER — ENSURE PO LIQD
237.0000 mL | Freq: Two times a day (BID) | ORAL | Status: DC
  Filled 2015-06-13 (×2): qty 237

## 2015-06-13 MED ORDER — ACETAMINOPHEN 325 MG PO TABS: 650.0000 mg | ORAL_TABLET | Freq: Four times a day (QID) | ORAL | Status: DC | PRN

## 2015-06-13 MED ORDER — ZOLPIDEM TARTRATE 5 MG PO TABS: 10.0000 mg | ORAL_TABLET | Freq: Every evening | ORAL | Status: DC | PRN

## 2015-06-13 MED ORDER — FERROUS SULFATE 325 (65 FE) MG PO TABS
325.0000 mg | ORAL_TABLET | Freq: Two times a day (BID) | ORAL | Status: DC
  Administered 2015-06-13 – 2015-06-16 (×7): 325 mg via ORAL
  Filled 2015-06-13 (×7): qty 1

## 2015-06-13 MED ORDER — VITAMIN D 1000 UNITS PO TABS
1000.0000 [IU] | ORAL_TABLET | Freq: Every day | ORAL | Status: DC
  Administered 2015-06-13 – 2015-06-16 (×4): 1000 [IU] via ORAL
  Filled 2015-06-13 (×4): qty 1

## 2015-06-13 MED ORDER — MOMETASONE FURO-FORMOTEROL FUM 200-5 MCG/ACT IN AERO
2.0000 | INHALATION_SPRAY | Freq: Two times a day (BID) | RESPIRATORY_TRACT | Status: DC
  Administered 2015-06-13 – 2015-06-16 (×7): 2 via RESPIRATORY_TRACT
  Filled 2015-06-13: qty 8.8

## 2015-06-13 MED ORDER — ARIPIPRAZOLE 2 MG PO TABS
2.0000 mg | ORAL_TABLET | Freq: Every day | ORAL | Status: DC
  Administered 2015-06-13 – 2015-06-16 (×4): 2 mg via ORAL
  Filled 2015-06-13 (×4): qty 1

## 2015-06-13 MED ORDER — PREGABALIN 25 MG PO CAPS
200.0000 mg | ORAL_CAPSULE | Freq: Two times a day (BID) | ORAL | Status: DC | PRN
  Administered 2015-06-15: 200 mg via ORAL
  Filled 2015-06-13: qty 2

## 2015-06-13 MED ORDER — OXYCODONE-ACETAMINOPHEN 5-325 MG PO TABS
2.0000 | ORAL_TABLET | Freq: Once | ORAL | Status: AC
  Administered 2015-06-13: 2 via ORAL
  Filled 2015-06-13: qty 2

## 2015-06-13 MED ORDER — DOCUSATE SODIUM 100 MG PO CAPS
100.0000 mg | ORAL_CAPSULE | Freq: Two times a day (BID) | ORAL | Status: DC
  Administered 2015-06-13 – 2015-06-16 (×6): 100 mg via ORAL
  Filled 2015-06-13 (×6): qty 1

## 2015-06-13 MED ORDER — OXYCODONE HCL 5 MG PO TABS
10.0000 mg | ORAL_TABLET | Freq: Four times a day (QID) | ORAL | Status: DC | PRN
  Administered 2015-06-13 – 2015-06-15 (×5): 10 mg via ORAL
  Filled 2015-06-13 (×5): qty 2

## 2015-06-13 NOTE — H&P (Signed)
[name] is an 60 y.o. female.   Chief Complaint: Shortness of breath HPI: The patient with past medical history of rectal cancer status post colectomy with metastatic squamous cell carcinoma presents to the emergency department complaining of shortness of breath. She denies chest pain, nausea, vomiting or diaphoresis. The patient was discharged little more than 24 hours ago following an admission for weakness secondary to anemia and deconditioning. Upon arrival to her skilled nursing facility she became acutely short of breath prompted evaluation in the emergency department. CT of her chest showed a loculated pleural effusion or empyema of the right lung. The patient was placed on supplement oxygen in the hospital staff was called for admission.  Past Medical History  Diagnosis Date  . Schizophrenia ([place])   . Asthma   . GERD (gastroesophageal reflux disease)   . Anxiety   . Depression   . Bipolar disorder ([place])   . COPD (chronic obstructive pulmonary disease) ([place])   . Occasional tremors     right hand  . PTSD (post-traumatic stress disorder)   . Shortness of breath dyspnea   . Fibromyalgia   . DVT (deep venous thrombosis) ([place]) 2011    RUE  . Thyroid nodule   . DDD (degenerative disc disease), lumbar   . Spinal stenosis   . Peripheral neuropathy ([place])   . Rotator cuff tear     right  . Pneumonia 2011  . Hypothyroidism     no meds currently  . Anemia     during pregnancy only  . Hypertension     Off meds x 15 years-well controlled now per pt  . Severe obstructive sleep apnea 06/27/2014  . DVT of upper extremity (deep vein thrombosis) ([place]) 10/14/2014  . Squamous cell cancer, anus ([facility])   . Lung cancer ([place])   . Hemorrhoid 06/27/2014  . SBO (small bowel obstruction) [facility])     Past Surgical History  Procedure Laterality Date  . Foot surgery Right   . Tubal ligation    . Eye surgery Bilateral   . Mouth surgery  2002  . Rectal biopsy N/A 05/08/2014    Procedure:  BIOPSY RECTAL;  Surgeon: Marlyce Huge, MD;  Location: [facility] ORS;  Service: General;  Laterality: N/A;  . Evaluation under anesthesia with hemorrhoidectomy N/A 05/08/2014    Procedure: EXAM UNDER ANESTHESIA WITH HEMORRHOIDECTOMY;  Surgeon: Marlyce Huge, MD;  Location: [facility] ORS;  Service: General;  Laterality: N/A;  . Portacath placement N/A 05/20/2014    Procedure: INSERTION PORT-A-CATH;  Surgeon: Florene Glen, MD;  Location: [facility] ORS;  Service: General;  Laterality: N/A;  . Laparoscopic diverted colostomy N/A 05/26/2014    Procedure: LAPAROSCOPIC DIVERTED COLOSTOMY;  Surgeon: Marlyce Huge, MD;  Location: [facility] ORS;  Service: General;  Laterality: N/A;  . Peripheral vascular catheterization Left 10/16/2014    Procedure: Upper Extremity Venography with thrombectomy, port removal;  Surgeon: Algernon Huxley, MD;  Location: [place] CV LAB;  Service: Cardiovascular;  Laterality: Left;  . Peripheral vascular catheterization  10/16/2014    Procedure: Upper Extremity Intervention;  Surgeon: Algernon Huxley, MD;  Location: [place] [facility];  Service: Cardiovascular;;  . Video assisted thoracoscopy (vats)/thorocotomy Right 04/10/2015    Procedure: PRE-OP BRONCHOSCOPY, VIDEO ASSISTED THORACOSCOPY RIGHT WITH PLEURAL BIOPSIES, & PLEURAL DESIS;  Surgeon: Nestor Lewandowsky, MD;  Location: [facility] ORS;  Service: Thoracic;  Laterality: Right;  . Esophagogastroduodenoscopy N/A 04/19/2015    Procedure: ESOPHAGOGASTRODUODENOSCOPY (EGD);  Surgeon: Hulen Luster, MD;  Location: [facility]  ENDOSCOPY;  Service: Endoscopy;  Laterality: N/A;  . Colonoscopy N/A 04/19/2015    Procedure: COLONOSCOPY;  Surgeon: Hulen Luster, MD;  Location: [facility] ENDOSCOPY;  Service: Endoscopy;  Laterality: N/A;  . Peripheral vascular catheterization N/A 05/14/2015    Procedure: Porta Cath Insertion;  Surgeon: Algernon Huxley, MD;  Location: [place] [facility];  Service: Cardiovascular;  Laterality: N/A;    Family History  Problem Relation Age  of Onset  . Cancer Maternal Aunt   . Cancer Paternal Uncle   . Diabetes Mother   . Thyroid disease Mother   . Kidney failure Mother   . Hypertension Mother   . Depression Mother   . Mental illness Son   . Aneurysm Maternal Grandmother   . Thyroid disease Maternal Grandmother   . Stroke Maternal Grandfather   . Hypertension Maternal Grandfather   . Diabetes Maternal Grandfather   . Heart disease Maternal Grandfather     MI  . Nephrolithiasis Daughter    Social History:  reports that she quit smoking about 2 months ago. Her smoking use included Cigarettes. She started smoking about 44 years ago. She has a 4.4 pack-year smoking history. She has never used smokeless tobacco. She reports that she does not drink alcohol or use illicit drugs.  Allergies:  Allergies  Allergen Reactions  . Fish Allergy Anaphylaxis    Patient allergic to perch only. She can eat other fish.  . Albuterol Sulfate Itching and Swelling    Patient states the inhaler causes a reaction, but not the nebs  . Sulfa Antibiotics Hives     (Not in a hospital admission)  Results for orders placed or performed during the hospital encounter of 06/12/15 (from the past 48 hour(s))  Urinalysis complete, with microscopic     Status: Abnormal   Collection Time: 06/12/15 10:40 PM  Result Value Ref Range   Color, Urine AMBER (A) YELLOW   APPearance HAZY (A) CLEAR   Glucose, UA NEGATIVE NEGATIVE mg/dL   Bilirubin Urine NEGATIVE NEGATIVE   Ketones, ur NEGATIVE NEGATIVE mg/dL   Specific Gravity, Urine 1.027 1.005 - 1.030   Hgb urine dipstick 2+ (A) NEGATIVE   pH 5.0 5.0 - 8.0   Protein, ur 30 (A) NEGATIVE mg/dL   Nitrite NEGATIVE NEGATIVE   Leukocytes, UA TRACE (A) NEGATIVE   RBC / HPF 6-30 0 - 5 RBC/hpf   WBC, UA 6-30 0 - 5 WBC/hpf   Bacteria, UA NONE SEEN NONE SEEN   Squamous Epithelial / LPF 0-5 (A) NONE SEEN   Mucous PRESENT    Hyaline Casts, UA PRESENT   Comprehensive metabolic panel     Status: Abnormal    Collection Time: 06/12/15 10:50 PM  Result Value Ref Range   Sodium 134 (L) 135 - 145 mmol/L   Potassium 4.1 3.5 - 5.1 mmol/L   Chloride 101 101 - 111 mmol/L   CO2 28 22 - 32 mmol/L   Glucose, Bld 91 65 - 99 mg/dL   BUN 18 6 - 20 mg/dL   Creatinine, Ser 0.98 0.44 - 1.00 mg/dL   Calcium 11.6 (H) 8.9 - 10.3 mg/dL   Total Protein 5.8 (L) 6.5 - 8.1 g/dL   Albumin 2.0 (L) 3.5 - 5.0 g/dL   AST 57 (H) 15 - 41 U/L   ALT 15 14 - 54 U/L   Alkaline Phosphatase 160 (H) 38 - 126 U/L   Total Bilirubin 1.0 0.3 - 1.2 mg/dL   GFR calc non Af Amer >  60 >60 mL/min   GFR calc Af Amer >60 >60 mL/min    Comment: (NOTE) The eGFR has been calculated using the CKD EPI equation. This calculation has not been validated in all clinical situations. eGFR's persistently <60 mL/min signify possible Chronic Kidney Disease.    Anion gap 5 5 - 15  Lipase, blood     Status: Abnormal   Collection Time: 06/12/15 10:50 PM  Result Value Ref Range   Lipase 68 (H) 11 - 51 U/L  CBC with Differential     Status: Abnormal   Collection Time: 06/12/15 10:50 PM  Result Value Ref Range   WBC 10.1 3.6 - 11.0 K/uL   RBC 3.25 (L) 3.80 - 5.20 MIL/uL   Hemoglobin 9.9 (L) 12.0 - 16.0 g/dL   HCT 29.7 (L) 35.0 - 47.0 %   MCV 91.3 80.0 - 100.0 fL   MCH 30.4 26.0 - 34.0 pg   MCHC 33.3 32.0 - 36.0 g/dL   RDW 18.0 (H) 11.5 - 14.5 %   Platelets 168 150 - 440 K/uL   Neutrophils Relative % 75 %   Neutro Abs 7.5 (H) 1.4 - 6.5 K/uL   Lymphocytes Relative 9 %   Lymphs Abs 0.9 (L) 1.0 - 3.6 K/uL   Monocytes Relative 5 %   Monocytes Absolute 0.5 0.2 - 0.9 K/uL   Eosinophils Relative 10 %   Eosinophils Absolute 1.0 (H) 0 - 0.7 K/uL   Basophils Relative 1 %   Basophils Absolute 0.1 0 - 0.1 K/uL   *Note: Due to a large number of results and/or encounters for the requested time period, some results have not been displayed. A complete set of results can be found in Results Review.   Ct Abdomen Pelvis W Contrast  06/13/2015  CLINICAL  DATA:  60 year old female with abdominal pain. EXAM: CT ABDOMEN AND PELVIS WITH CONTRAST TECHNIQUE: Multidetector CT imaging of the abdomen and pelvis was performed using the standard protocol following bolus administration of intravenous contrast. CONTRAST:  169m ISOVUE-300 IOPAMIDOL (ISOVUE-300) INJECTION 61% COMPARISON:  CT dated 03/15/2015 FINDINGS: There is a partially visualized moderate-sized left pleural effusion which is new from study dated 03/15/2015. There is associated partial compressive atelectasis of the visualized portion of the left lower lobe. There is a small loculated appearing right pleural effusion all with enhancement and nodularity of the visualized right posterior pleural concerning for pleural implants and malignant effusion. Superimposed infection can't empyema is not excluded. Patchy a consolidative change with air bronchogram at the right lung base may represent atelectasis or infiltrate. There is a 13 x 10 mm nodular density along the right minor fissure which may represent a small loculated pleural fluid versus a pleural implant. Right hilar adenopathy partially visualized measuring approximately 11 mm short axis. No intra-abdominal free air. There is moderate ascites, increased from prior study. There is slight irregularity of the hepatic contour. The gallbladder, pancreas, spleen, adrenal glands, kidneys, visualized ureters appear unremarkable. The urinary bladder is predominantly collapsed. The uterus is grossly unremarkable. Bilateral tubal ligation clips noted. The ovaries are not well visualized. There is a left lower quadrant ostomy or mucus fistula. There is colonic diverticulosis without definite evidence of acute diverticulitis. Evaluation is however limited due to ascites. Top-normal caliber loops of small bowel in the mid abdomen most likely represent a degree of ileus. No transition point identified to suggest bowel obstruction. Normal appendix. There is mild to moderate  aortoiliac atherosclerotic disease. There is a 4.5 cm infrarenal abdominal aortic  aneurysm. Follow-up as previously recommended. No portal venous gas identified. There is no adenopathy. Clear there is diffuse subcutaneous soft tissue edema and anasarca. There is osteopenia. Multilevel lower thoracic compression fracture involving T9, T10, and T12 with near complete collapse of the T12) similar to prior study. No acute fracture identified. IMPRESSION: Partially visualized moderate left pleural effusion, new from prior study. Loculated right pleural effusion/empyema with findings concerning for pleural implants. A small nodular density along the right minor fissure may represent loculated fluid or metastatic disease. Moderate ascites, increased from prior study. Diffuse small bowel ileus without definite evidence of obstruction. 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 Colonic diverticulosis without definite evidence of diverticulitis. A 4.5 cm infrarenal abdominal aortic aneurysm. Follow-up as previously recommended. Anasarca.  2 Electronically Signed   By: Anner Crete M.D.   On: 06/13/2015 00:40    Review of Systems  Constitutional: Positive for malaise/fatigue. Negative for fever and chills.  HENT: Negative for sore throat and tinnitus.   Eyes: Negative for blurred vision and redness.  Respiratory: Positive for shortness of breath. Negative for cough.   Cardiovascular: Negative for chest pain, palpitations, orthopnea and PND.  Gastrointestinal: Negative for nausea, vomiting, abdominal pain and diarrhea.  Genitourinary: Negative for dysuria, urgency and frequency.  Musculoskeletal: Negative for myalgias and joint pain.  Skin: Negative for rash.       No lesions  Neurological: Negative for speech change, focal weakness and weakness.  Endo/Heme/Allergies: Does not bruise/bleed easily.       No temperature intolerance  Psychiatric/Behavioral: Negative for depression and  suicidal ideas.    Blood pressure 138/86, pulse 82, temperature 98 F (36.7 C), temperature source Oral, resp. rate 18, SpO2 100 %. Physical Exam  Vitals reviewed. Constitutional: She is oriented to person, place, and time. She appears well-developed and well-nourished.  HENT:  Head: Normocephalic and atraumatic.  Mouth/Throat: Oropharynx is clear and moist.  Eyes: Conjunctivae and EOM are normal. Pupils are equal, round, and reactive to light.  Neck: Normal range of motion. Neck supple. No JVD present. No tracheal deviation present. No thyromegaly present.  Cardiovascular: Normal rate, regular rhythm and normal heart sounds.  Exam reveals no gallop and no friction rub.   No murmur heard. Respiratory: Effort normal. She has decreased breath sounds in the right lower field.  GI: Soft. Bowel sounds are normal. She exhibits no distension. There is no tenderness.  Colostomy in place  Genitourinary:  Deferred  Musculoskeletal: Normal range of motion. She exhibits no edema.  Lymphadenopathy:    She has no cervical adenopathy.  Neurological: She is alert and oriented to person, place, and time. No cranial nerve deficit. She exhibits normal muscle tone.  Skin: Skin is warm and dry. No rash noted. No erythema.  Psychiatric: She has a normal mood and affect. Her behavior is normal. Judgment and thought content normal.     Assessment/Plan This is a 60 year old female admitted for pleural effusion and empyema of the right lung. 1. Pleural effusion: Appears loculated and is likely an empyema. Likely secondary to malignancy. At rest the patient is comfortable. Oxygen saturations improve with O2 via nasal cannula. Will consult thoracic surgery for chest tube or catheter placement.  No signs or symptoms of sepsis. Hold antibiotics. 2. Lung cancer: discuss chemo schedule with primary oncologist 3. COPD: Continue her corticosteroid and Spiriva. Albuterol as needed 4. Bipolar depression: Continue  Abilify  5. DVT prophylaxis: As the patient will likely have a surgical procedure I  have held Eliquis; continue SCDs 6. GI prophylaxis: None The patient is a full code area time spent on admission orders and patient care approximately 45 minutes  [name], MD 06/13/2015, 3:59 AM

## 2015-06-13 NOTE — ED Provider Notes (Signed)
I assumed care of the patient 11:30 PM from Dr. Rip Harbour. CT scan revealed CT Abdomen Pelvis W Contrast (Final result) Result time: 06/13/15 00:40:32   Final result by Rad Results In Interface (06/13/15 00:40:32)   Narrative:   CLINICAL DATA: 60 year old female with abdominal pain.  EXAM: CT ABDOMEN AND PELVIS WITH CONTRAST  TECHNIQUE: Multidetector CT imaging of the abdomen and pelvis was performed using the standard protocol following bolus administration of intravenous contrast.  CONTRAST: 153m ISOVUE-300 IOPAMIDOL (ISOVUE-300) INJECTION 61%  COMPARISON: CT dated 03/15/2015  FINDINGS: There is a partially visualized moderate-sized left pleural effusion which is new from study dated 03/15/2015. There is associated partial compressive atelectasis of the visualized portion of the left lower lobe. There is a small loculated appearing right pleural effusion all with enhancement and nodularity of the visualized right posterior pleural concerning for pleural implants and malignant effusion. Superimposed infection can't empyema is not excluded. Patchy a consolidative change with air bronchogram at the right lung base may represent atelectasis or infiltrate. There is a 13 x 10 mm nodular density along the right minor fissure which may represent a small loculated pleural fluid versus a pleural implant. Right hilar adenopathy partially visualized measuring approximately 11 mm short axis.  No intra-abdominal free air. There is moderate ascites, increased from prior study.  There is slight irregularity of the hepatic contour. The gallbladder, pancreas, spleen, adrenal glands, kidneys, visualized ureters appear unremarkable. The urinary bladder is predominantly collapsed. The uterus is grossly unremarkable. Bilateral tubal ligation clips noted. The ovaries are not well visualized.  There is a left lower quadrant ostomy or mucus fistula. There is colonic diverticulosis without  definite evidence of acute diverticulitis. Evaluation is however limited due to ascites. Top-normal caliber loops of small bowel in the mid abdomen most likely represent a degree of ileus. No transition point identified to suggest bowel obstruction. Normal appendix.  There is mild to moderate aortoiliac atherosclerotic disease. There is a 4.5 cm infrarenal abdominal aortic aneurysm. Follow-up as previously recommended. No portal venous gas identified. There is no adenopathy. Clear there is diffuse subcutaneous soft tissue edema and anasarca. There is osteopenia. Multilevel lower thoracic compression fracture involving T9, T10, and T12 with near complete collapse of the T12) similar to prior study. No acute fracture identified.  IMPRESSION: Partially visualized moderate left pleural effusion, new from prior study.  Loculated right pleural effusion/empyema with findings concerning for pleural implants. A small nodular density along the right minor fissure may represent loculated fluid or metastatic disease.  Moderate ascites, increased from prior study.  Diffuse small bowel ileus without definite evidence of obstruction.  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 Colonic diverticulosis without definite evidence of diverticulitis.  A 4.5 cm infrarenal abdominal aortic aneurysm. Follow-up as previously recommended.  Anasarca. 2   Electronically Signed By: AAnner CreteM.D. On: 06/13/2015 00:40      Given the aforementioned findings patient discussed with Dr. dMarcille Blancofor hospital admission for further evaluation and management  RGregor Hams MD 06/13/15 [id number]

## 2015-06-13 NOTE — Progress Notes (Signed)
[facility] NAME: [name]    MR#:  [id number]  DATE OF BIRTH:  [birth year]  SUBJECTIVE:  CHIEF COMPLAINT:  Patient is resting comfortably. Denies any shortness of breath. Right-sided sutures were removed by the RN today  REVIEW OF SYSTEMS:  Constitutional: Positive for malaise/fatigue. Negative for fever and chills.  HENT: Negative for sore throat and tinnitus.  Eyes: Negative for blurred vision and redness.  Respiratory: Positive for shortness of breath. Negative for cough.  Cardiovascular: Negative for chest pain, palpitations, orthopnea and PND.  Gastrointestinal: Negative for nausea, vomiting, abdominal pain and diarrhea.  Genitourinary: Negative for dysuria, urgency and frequency.  Musculoskeletal: Negative for myalgias and joint pain.  Skin: Negative for rash.   No lesions  Neurological: Negative for speech change, focal weakness and weakness.  Endo/Heme/Allergies: Does not bruise/bleed easily.   No temperature intolerance  Psychiatric/Behavioral: Negative for depression and suicidal ideas.   DRUG ALLERGIES:   Allergies  Allergen Reactions  . Fish Allergy Anaphylaxis    Patient allergic to perch only. She can eat other fish.  . Albuterol Sulfate Itching and Swelling    Patient states the inhaler causes a reaction, but not the nebs  . Sulfa Antibiotics Hives    VITALS:  Blood pressure 130/69, pulse 77, temperature 97.9 F (36.6 C), temperature source Oral, resp. rate 18, height 6' (1.829 m), weight 88.814 kg (195 lb 12.8 oz), SpO2 99 %.  PHYSICAL EXAMINATION:  GENERAL:  60 y.o.-year-old patient lying in the bed with no acute distress.  EYES: Pupils equal, round, reactive to light and accommodation. No scleral icterus. Extraocular muscles intact.  HEENT: Head atraumatic, normocephalic. Oropharynx and nasopharynx clear.  NECK:  Supple, no jugular venous distention. No thyroid enlargement, no  tenderness.  LUNGS: Diminished breath sounds in the right lung field , no wheezing, rales,rhonchi or crepitation. No use of accessory muscles of respiration.  CARDIOVASCULAR: S1, S2 normal. No murmurs, rubs, or gallops.  ABDOMEN: Soft, nontender, nondistended. Bowel sounds present. No organomegaly or mass.  EXTREMITIES: No pedal edema, cyanosis, or clubbing.  NEUROLOGIC: Cranial nerves II through XII are intact. Muscle strength 5/5 in all extremities. Sensation intact. Gait not checked.  PSYCHIATRIC: The patient is alert and oriented x 3.  SKIN: No obvious rash, lesion, or ulcer.    LABORATORY PANEL:   CBC  Recent Labs Lab 06/12/15 2250  WBC 10.1  HGB 9.9*  HCT 29.7*  PLT 168   ------------------------------------------------------------------------------------------------------------------  Chemistries   Recent Labs Lab 06/12/15 2250  NA 134*  K 4.1  CL 101  CO2 28  GLUCOSE 91  BUN 18  CREATININE 0.98  CALCIUM 11.6*  AST 57*  ALT 15  ALKPHOS 160*  BILITOT 1.0   ------------------------------------------------------------------------------------------------------------------  Cardiac Enzymes  Recent Labs Lab 06/09/15 2213  TROPONINI <0.03   ------------------------------------------------------------------------------------------------------------------  RADIOLOGY:  Ct Abdomen Pelvis W Contrast  06/13/2015  CLINICAL DATA:  60 year old female with abdominal pain. EXAM: CT ABDOMEN AND PELVIS WITH CONTRAST TECHNIQUE: Multidetector CT imaging of the abdomen and pelvis was performed using the standard protocol following bolus administration of intravenous contrast. CONTRAST:  169m ISOVUE-300 IOPAMIDOL (ISOVUE-300) INJECTION 61% COMPARISON:  CT dated 03/15/2015 FINDINGS: There is a partially visualized moderate-sized left pleural effusion which is new from study dated 03/15/2015. There is associated partial compressive atelectasis of the visualized portion of the left  lower lobe. There is a small loculated appearing right pleural effusion all with enhancement and nodularity of  the visualized right posterior pleural concerning for pleural implants and malignant effusion. Superimposed infection can't empyema is not excluded. Patchy a consolidative change with air bronchogram at the right lung base may represent atelectasis or infiltrate. There is a 13 x 10 mm nodular density along the right minor fissure which may represent a small loculated pleural fluid versus a pleural implant. Right hilar adenopathy partially visualized measuring approximately 11 mm short axis. No intra-abdominal free air. There is moderate ascites, increased from prior study. There is slight irregularity of the hepatic contour. The gallbladder, pancreas, spleen, adrenal glands, kidneys, visualized ureters appear unremarkable. The urinary bladder is predominantly collapsed. The uterus is grossly unremarkable. Bilateral tubal ligation clips noted. The ovaries are not well visualized. There is a left lower quadrant ostomy or mucus fistula. There is colonic diverticulosis without definite evidence of acute diverticulitis. Evaluation is however limited due to ascites. Top-normal caliber loops of small bowel in the mid abdomen most likely represent a degree of ileus. No transition point identified to suggest bowel obstruction. Normal appendix. There is mild to moderate aortoiliac atherosclerotic disease. There is a 4.5 cm infrarenal abdominal aortic aneurysm. Follow-up as previously recommended. No portal venous gas identified. There is no adenopathy. Clear there is diffuse subcutaneous soft tissue edema and anasarca. There is osteopenia. Multilevel lower thoracic compression fracture involving T9, T10, and T12 with near complete collapse of the T12) similar to prior study. No acute fracture identified. IMPRESSION: Partially visualized moderate left pleural effusion, new from prior study. Loculated right pleural  effusion/empyema with findings concerning for pleural implants. A small nodular density along the right minor fissure may represent loculated fluid or metastatic disease. Moderate ascites, increased from prior study. Diffuse small bowel ileus without definite evidence of obstruction. 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 Colonic diverticulosis without definite evidence of diverticulitis. A 4.5 cm infrarenal abdominal aortic aneurysm. Follow-up as previously recommended. Anasarca.  2 Electronically Signed   By: Anner Crete M.D.   On: 06/13/2015 00:40    EKG:   Orders placed or performed during the hospital encounter of 06/09/15  . EKG 12-Lead  . EKG 12-Lead  . ED EKG  . ED EKG  . EKG 12-Lead  . EKG 12-Lead   *Note: Due to a large number of results and/or encounters for the requested time period, some results have not been displayed. A complete set of results can be found in Results Review.    ASSESSMENT AND PLAN:     This is a 60 year old female admitted for pleural effusion and empyema of the right lung. 1. Pleural effusion: Appears loculated and is likely an empyema. Likely secondary to malignancy. 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 Faith Rogue  Continue O2 via nasal cannula. Ultrasound-guided Paracentesis is ordered  for pathology as recommended by Dr. Faith Rogue  appreciate Thoracic surgery recommendations   2. Lung cancer: discuss chemo schedule with primary oncologist, outpatient follow-up with oncology as recommended  3. COPD: Continue her corticosteroid and Spiriva. Albuterol as needed  4. Bipolar depression: Continue Abilify   5. DVT prophylaxis: As the patient will likely have a surgical procedure I have held Eliquis; continue SCDs  6. GI prophylaxis: None  The patient is a full code  All the records are reviewed and case discussed with Care Management/Social  Workerr. Management plans discussed with the patient, family and they are in agreement.  CODE STATUS: FC   TOTAL TIME TAKING  CARE OF THIS PATIENT: 35  minutes.   POSSIBLE D/C IN 2-3 DAYS, DEPENDING ON CLINICAL CONDITION.  Note: This dictation was prepared with Dragon dictation along with smaller phrase technology. Any transcriptional errors that result from this process are unintentional.   Nicholes Mango M.D on 06/13/2015 at 3:09 PM  Between 7am to 6pm - Pager - [phone] After 6pm go to [email or web address] - password [email or web address] [facility]  Office  [phone]  CC: Primary care physician; [name], NP

## 2015-06-13 NOTE — Progress Notes (Signed)
[facility] Post-[facility] Note  Patient ID: [name], female   DOB: 01-06-1956, 60 y.o.   MRN: [id number]  HISTORY: This patient is well known to the surgical services. She has undergone a right-sided thoracoscopy with talc pleurodesis for a malignant pleural effusion several weeks ago. She tells me she presented back to the emergency department with increasing pain on her right side. She describes this pain as sharp and lasting for more than an hour occurring along the lateral aspect of her right chest wall. She does not describe any other symptoms such as cough fever or chills. She does not endorse shortness of breath.   Filed Vitals:   06/13/15 0439 06/13/15 0931  BP: 119/66 134/77  Pulse: 71 81  Temp: 98.2 F (36.8 C) 97.4 F (36.3 C)  Resp: 17 16     EXAM: Resp: Lungs are Decreased bilaterally.  No respiratory distress, normal effort. Heart:  Regular without murmurs  She does have sutures present on the right chest wall. These are well healed.  She had a CT scan made which have independently reviewed. This reveals multiple pleural-based nodules along the right chest wall. There is a small to moderate sized left pleural effusion. I do not see any concerning radiologic findings for an empyema.  ASSESSMENT: Left-sided pleural effusion most likely malignant in nature. She does have ascites.   PLAN:   I would recommend a percutaneous ultrasound-guided thoracentesis for the left pleural effusion. I would send this fluid for analysis to determine whether or not there is a malignancy present. If the pleural effusion recurs we could consider doxycycline pleurodesis. I do not believe that her social situation is such that a Pleurx catheter would be most appropriate.    Nestor Lewandowsky, MD

## 2015-06-13 NOTE — Progress Notes (Signed)
Initial Nutrition Assessment  DOCUMENTATION CODES:   Not applicable  INTERVENTION:  -Monitor intake and cater to pt preferences -Recommend Ensure Enlive po BID, each supplement provides 350 kcal and 20 grams of protein   NUTRITION DIAGNOSIS:   Inadequate oral intake related to acute illness as evidenced by per patient/family report.    GOAL:   Patient will meet greater than or equal to 90% of their needs    MONITOR:   PO intake, Supplement acceptance  REASON FOR ASSESSMENT:   Malnutrition Screening Tool    ASSESSMENT:     Pt admitted with left sided pleural effusion most likely malignant.  Recent admission for right sided thorascopy with talc pleurodosis   Past Medical History  Diagnosis Date  . Schizophrenia ([place])   . Asthma   . GERD (gastroesophageal reflux disease)   . Anxiety   . Depression   . Bipolar disorder ([place])   . COPD (chronic obstructive pulmonary disease) ([place])   . Occasional tremors     right hand  . PTSD (post-traumatic stress disorder)   . Shortness of breath dyspnea   . Fibromyalgia   . DVT (deep venous thrombosis) ([place]) 2011    RUE  . Thyroid nodule   . DDD (degenerative disc disease), lumbar   . Spinal stenosis   . Peripheral neuropathy ([place])   . Rotator cuff tear     right  . Pneumonia 2011  . Hypothyroidism     no meds currently  . Anemia     during pregnancy only  . Hypertension     Off meds x 15 years-well controlled now per pt  . Severe obstructive sleep apnea 06/27/2014  . DVT of upper extremity (deep vein thrombosis) ([place]) 10/14/2014  . Squamous cell cancer, anus ([facility])   . Lung cancer ([place])   . Hemorrhoid 06/27/2014  . SBO (small bowel obstruction) ([place])    Pt very lethargic and hard to keep awake during visit.  Pt reports poor po intake for the last few weeks secondary to no appetite.  Full ensure at bedside and took few sips out of another bottle.    Medications reviewed: fe sulfate, folic acid, colace, nystatin,  miralax, vit B12  Labs reveiwed: Na 134  Nutrition-Focused physical exam completed. Findings are no fat depletion, none except mild in hand area muscle depletion, and mild edema.     Diet Order:  Diet regular Room service appropriate?: Yes; Fluid consistency:: Thin  Skin:  Reviewed, no issues  Last BM:  6/5  Height:   Ht Readings from Last 1 Encounters:  06/13/15 6' (1.829 m)    Weight: Pt reports wt loss but unsure how much.  Noted on 5/16 wt of 189 lb 8 oz, 5/18 192 lb 9 oz, 6/3 190 lb, 6/4 188 lb 12.8 oz, 202 lb 8 oz, 195 lb 12.8 oz today.    Wt Readings from Last 1 Encounters:  06/13/15 195 lb 12.8 oz (88.814 kg)    Ideal Body Weight:     BMI:  Body mass index is 26.55 kg/(m^2).  Estimated Nutritional Needs:   Kcal:  1900-2100 kcals/d  Protein:  105-132 g/d  Fluid:  >/= 2 L/d  EDUCATION NEEDS:   No education needs identified at this time  [name] B. Zenia Resides, [place], [place] (pager) Weekend/On-Call pager [phone])

## 2015-06-13 NOTE — ED Notes (Signed)
Pt returned from CT at this time.  

## 2015-06-13 NOTE — ED Notes (Signed)
MD made aware of p's pain to abd. Pt states she take s percocet 10/325 for the pain.

## 2015-06-13 NOTE — ED Notes (Signed)
Pt transported to room 229

## 2015-06-13 NOTE — ED Notes (Signed)
MD Brown at bedside.

## 2015-06-14 ENCOUNTER — Inpatient Hospital Stay: Payer: Commercial Managed Care - HMO

## 2015-06-14 DIAGNOSIS — D649 Anemia, unspecified: Secondary | ICD-10-CM

## 2015-06-14 DIAGNOSIS — R5383 Other fatigue: Secondary | ICD-10-CM

## 2015-06-14 DIAGNOSIS — Z933 Colostomy status: Secondary | ICD-10-CM

## 2015-06-14 DIAGNOSIS — M549 Dorsalgia, unspecified: Secondary | ICD-10-CM

## 2015-06-14 DIAGNOSIS — J90 Pleural effusion, not elsewhere classified: Secondary | ICD-10-CM

## 2015-06-14 DIAGNOSIS — R109 Unspecified abdominal pain: Secondary | ICD-10-CM

## 2015-06-14 DIAGNOSIS — M7989 Other specified soft tissue disorders: Secondary | ICD-10-CM

## 2015-06-14 DIAGNOSIS — Z79899 Other long term (current) drug therapy: Secondary | ICD-10-CM

## 2015-06-14 DIAGNOSIS — Z8701 Personal history of pneumonia (recurrent): Secondary | ICD-10-CM

## 2015-06-14 DIAGNOSIS — R918 Other nonspecific abnormal finding of lung field: Secondary | ICD-10-CM

## 2015-06-14 DIAGNOSIS — C210 Malignant neoplasm of anus, unspecified: Secondary | ICD-10-CM

## 2015-06-14 LAB — GLUCOSE, SEROUS FLUID: GLUCOSE FL: 91 mg/dL

## 2015-06-14 LAB — BODY FLUID CELL COUNT WITH DIFFERENTIAL
Eos, Fluid: 7 %
Lymphs, Fluid: 21 %
Monocyte-Macrophage-Serous Fluid: 14 %
NEUTROPHIL FLUID: 58 %
WBC FLUID: 504 uL

## 2015-06-14 LAB — GRAM STAIN

## 2015-06-14 LAB — LACTATE DEHYDROGENASE, PLEURAL OR PERITONEAL FLUID: LD, Fluid: 71 U/L — ABNORMAL HIGH (ref 3–23)

## 2015-06-14 LAB — PROTEIN, BODY FLUID: Total protein, fluid: <3 g/dL

## 2015-06-14 MED ORDER — APIXABAN 5 MG PO TABS
2.5000 mg | ORAL_TABLET | Freq: Two times a day (BID) | ORAL | Status: DC
  Administered 2015-06-14 – 2015-06-16 (×5): 2.5 mg via ORAL
  Filled 2015-06-14 (×5): qty 1

## 2015-06-14 NOTE — Progress Notes (Signed)
[facility] [place] NAME: [name]    MR#:  [id number]  DATE OF BIRTH:  September 06, 1955  SUBJECTIVE:  CHIEF COMPLAINT:  Patient is resting comfortably. Head ultrasound guided thoracentesis today. Tolerated procedure well. Right-sided sutures were removed by the RN today  REVIEW OF SYSTEMS:  Constitutional: Positive for malaise/fatigue. Negative for fever and chills.  HENT: Negative for sore throat and tinnitus.  Eyes: Negative for blurred vision and redness.  Respiratory: Denies any shortness of breath. Negative for cough.  Cardiovascular: Negative for chest pain, palpitations, orthopnea and PND.  Gastrointestinal: Negative for nausea, vomiting, abdominal pain and diarrhea.  Genitourinary: Negative for dysuria, urgency and frequency.  Musculoskeletal: Negative for myalgias and joint pain.  Skin: Negative for rash.   No lesions  Neurological: Negative for speech change, focal weakness and weakness.  Endo/Heme/Allergies: Does not bruise/bleed easily.   No temperature intolerance  Psychiatric/Behavioral: Negative for depression and suicidal ideas.   DRUG ALLERGIES:   Allergies  Allergen Reactions  . Fish Allergy Anaphylaxis    Patient allergic to perch only. She can eat other fish.  . Albuterol Sulfate Itching and Swelling    Patient states the inhaler causes a reaction, but not the nebs  . Sulfa Antibiotics Hives    VITALS:  Blood pressure 128/76, pulse 92, temperature 97.5 F (36.4 C), temperature source Oral, resp. rate 19, height 6' (1.829 m), weight 88.451 kg (195 lb), SpO2 99 %.  PHYSICAL EXAMINATION:  GENERAL:  60 y.o.-year-old patient lying in the bed with no acute distress.  EYES: Pupils equal, round, reactive to light and accommodation. No scleral icterus. Extraocular muscles intact.  HEENT: Head atraumatic, normocephalic. Oropharynx and nasopharynx clear.  NECK:  Supple, no jugular venous distention.  No thyroid enlargement, no tenderness.  LUNGS: Diminished breath sounds in the right lung field , no wheezing, rales,rhonchi or crepitation. No use of accessory muscles of respiration.  CARDIOVASCULAR: S1, S2 normal. No murmurs, rubs, or gallops.  ABDOMEN: Soft, nontender, nondistended. Bowel sounds present. No organomegaly or mass.  EXTREMITIES: No pedal edema, cyanosis, or clubbing.  NEUROLOGIC: Cranial nerves II through XII are intact. Muscle strength 5/5 in all extremities. Sensation intact. Gait not checked.  PSYCHIATRIC: The patient is alert and oriented x 3.  SKIN: No obvious rash, lesion, or ulcer.    LABORATORY PANEL:   CBC  Recent Labs Lab 06/12/15 2250  WBC 10.1  HGB 9.9*  HCT 29.7*  PLT 168   ------------------------------------------------------------------------------------------------------------------  Chemistries   Recent Labs Lab 06/12/15 2250  NA 134*  K 4.1  CL 101  CO2 28  GLUCOSE 91  BUN 18  CREATININE 0.98  CALCIUM 11.6*  AST 57*  ALT 15  ALKPHOS 160*  BILITOT 1.0   ------------------------------------------------------------------------------------------------------------------  Cardiac Enzymes  Recent Labs Lab 06/09/15 2213  TROPONINI <0.03   ------------------------------------------------------------------------------------------------------------------  RADIOLOGY:  Dg Chest 2 View  06/14/2015  CLINICAL DATA:  60 year old female status post left thoracentesis. EXAM: CHEST  2 VIEW COMPARISON:  Prior chest x-ray 06/09/2015 FINDINGS: No evidence of a pneumothorax following left-sided thoracentesis. Inspiratory volumes remain very low with right greater than left basilar atelectasis. Persistent pleural thickening in the superolateral aspect of the right hemi thorax. Stable position of right IJ approach single-lumen power injectable port catheter with the catheter tip overlying the distal IVC. IMPRESSION: No evidence of pneumothorax  following left-sided thoracentesis. Persistent very low inspiratory volumes with right greater than left basilar atelectasis. Electronically Signed  By: Jacqulynn Cadet M.D.   On: 06/14/2015 11:30   Ct Abdomen Pelvis W Contrast  06/13/2015  CLINICAL DATA:  60 year old female with abdominal pain. EXAM: CT ABDOMEN AND PELVIS WITH CONTRAST TECHNIQUE: Multidetector CT imaging of the abdomen and pelvis was performed using the standard protocol following bolus administration of intravenous contrast. CONTRAST:  127m ISOVUE-300 IOPAMIDOL (ISOVUE-300) INJECTION 61% COMPARISON:  CT dated 03/15/2015 FINDINGS: There is a partially visualized moderate-sized left pleural effusion which is new from study dated 03/15/2015. There is associated partial compressive atelectasis of the visualized portion of the left lower lobe. There is a small loculated appearing right pleural effusion all with enhancement and nodularity of the visualized right posterior pleural concerning for pleural implants and malignant effusion. Superimposed infection can't empyema is not excluded. Patchy a consolidative change with air bronchogram at the right lung base may represent atelectasis or infiltrate. There is a 13 x 10 mm nodular density along the right minor fissure which may represent a small loculated pleural fluid versus a pleural implant. Right hilar adenopathy partially visualized measuring approximately 11 mm short axis. No intra-abdominal free air. There is moderate ascites, increased from prior study. There is slight irregularity of the hepatic contour. The gallbladder, pancreas, spleen, adrenal glands, kidneys, visualized ureters appear unremarkable. The urinary bladder is predominantly collapsed. The uterus is grossly unremarkable. Bilateral tubal ligation clips noted. The ovaries are not well visualized. There is a left lower quadrant ostomy or mucus fistula. There is colonic diverticulosis without definite evidence of acute  diverticulitis. Evaluation is however limited due to ascites. Top-normal caliber loops of small bowel in the mid abdomen most likely represent a degree of ileus. No transition point identified to suggest bowel obstruction. Normal appendix. There is mild to moderate aortoiliac atherosclerotic disease. There is a 4.5 cm infrarenal abdominal aortic aneurysm. Follow-up as previously recommended. No portal venous gas identified. There is no adenopathy. Clear there is diffuse subcutaneous soft tissue edema and anasarca. There is osteopenia. Multilevel lower thoracic compression fracture involving T9, T10, and T12 with near complete collapse of the T12) similar to prior study. No acute fracture identified. IMPRESSION: Partially visualized moderate left pleural effusion, new from prior study. Loculated right pleural effusion/empyema with findings concerning for pleural implants. A small nodular density along the right minor fissure may represent loculated fluid or metastatic disease. Moderate ascites, increased from prior study. Diffuse small bowel ileus without definite evidence of obstruction. 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 Colonic diverticulosis without definite evidence of diverticulitis. A 4.5 cm infrarenal abdominal aortic aneurysm. Follow-up as previously recommended. Anasarca.  2 Electronically Signed   By: AAnner CreteM.D.   On: 06/13/2015 00:40   UKoreaThoracentesis Asp Pleural Space W/img Guide  06/14/2015  INDICATION: 60year old female with a history of lung cancer and bilateral pleural effusions. EXAM: ULTRASOUND GUIDED LEFT THORACENTESIS MEDICATIONS: None. COMPLICATIONS: None immediate. PROCEDURE: An ultrasound guided thoracentesis was thoroughly discussed with the patient and questions answered. The benefits, risks, alternatives and complications were also discussed. The patient understands and wishes to proceed with the procedure. Written consent was obtained. Ultrasound  was performed to localize and mark an adequate pocket of fluid in the left chest. The area was then prepped and draped in the normal sterile fashion. 1% Lidocaine was used for local anesthesia. Under ultrasound guidance a 6 Fr Safe-T-Centesis catheter was introduced. Thoracentesis was performed. The catheter was removed and a dressing applied. FINDINGS: A total of approximately 350 mL of minimally bloody  pleural fluid was removed. Samples were sent to the laboratory as requested by the clinical team. IMPRESSION: Successful ultrasound guided left thoracentesis yielding 350 mL of pleural fluid. Electronically Signed   By: Jacqulynn Cadet M.D.   On: 06/14/2015 11:29    EKG:   Orders placed or performed during the hospital encounter of 06/09/15  . EKG 12-Lead  . EKG 12-Lead  . ED EKG  . ED EKG  . EKG 12-Lead  . EKG 12-Lead   *Note: Due to a large number of results and/or encounters for the requested time period, some results have not been displayed. A complete set of results can be found in Results Review.    ASSESSMENT AND PLAN:     This is a 60 year old female admitted for pleural effusion and empyema of the right lung. 1. Pleural effusion: Appears loculated and is likely an empyema. Likely secondary to malignancy. 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 Faith Rogue  Continue O2 via nasal cannula. Ultrasound-guided Paracentesis is Performed today 06/14/2015 and sent  for pathology as recommended by Dr. Faith Rogue  appreciate Thoracic surgery recommendations . Okay to discharge patient from thoracic surgery standpoint if medically stable  2. Lung cancer:Oncology consult is placed to Dr. Mike Gip regarding further plan  3. COPD: Continue her corticosteroid and Spiriva. Albuterol as needed  4. Bipolar depression: Continue Abilify   5. DVT prophylaxis: Resume Eliquis; continue SCDs  6. GI prophylaxis: None  The  patient is a full code  All the records are reviewed and case discussed with Care Management/Social Workerr. Management plans discussed with the patient, she is  in agreement.  CODE STATUS: FC   TOTAL TIME TAKING CARE OF THIS PATIENT: 35  minutes.   POSSIBLE D/C IN am DAYS, DEPENDING ON CLINICAL CONDITION.  Note: This dictation was prepared with Dragon dictation along with smaller phrase technology. Any transcriptional errors that result from this process are unintentional.   Nicholes Mango M.D on 06/14/2015 at 1:27 PM  Between 7am to 6pm - Pager - [phone] After 6pm go to [email or web address] - password [email or web address] [facility]  Office  [phone]  CC: Primary care physician; [name], NP

## 2015-06-15 LAB — CBC
HEMATOCRIT: 27.5 % — AB (ref 35.0–47.0)
HEMOGLOBIN: 8.8 g/dL — AB (ref 12.0–16.0)
MCH: 29.5 pg (ref 26.0–34.0)
MCHC: 32.1 g/dL (ref 32.0–36.0)
MCV: 92 fL (ref 80.0–100.0)
Platelets: 160 10*3/uL (ref 150–440)
RBC: 2.99 MIL/uL — ABNORMAL LOW (ref 3.80–5.20)
RDW: 18.6 % — ABNORMAL HIGH (ref 11.5–14.5)
WBC: 7.8 10*3/uL (ref 3.6–11.0)

## 2015-06-15 LAB — CREATININE, SERUM
Creatinine, Ser: 1.08 mg/dL — ABNORMAL HIGH (ref 0.44–1.00)
GFR, EST NON AFRICAN AMERICAN: 55 mL/min — AB (ref >60)

## 2015-06-15 LAB — CYTOLOGY - NON PAP

## 2015-06-15 LAB — MISC LABCORP TEST (SEND OUT): Labcorp test code: 19588

## 2015-06-15 NOTE — Care Management (Signed)
Patient admitted with Pleural effusion: secondary to malignancy. Prior to admission patient was [facility] with [facility].  Patient lives at home with her daughter April.  Has Walker, chronic O2, nebulizer and shower chair in the home.  Patient states that prior to discharge she will need a hospital bed.  Oncology and hospitalist agree that patient is Appropriate for hospice services in the home.  Patient and daughter in agreement.  I have offered list of hospice agencies. There was not a preferred agency.  Referral was made to [facility].  Santiago Glad with [facility] has met with the patient and family to facilitate discharge.  Plan for hospital bed to be delivered tomorrow and patient to be discharged 06/16/15.

## 2015-06-15 NOTE — Care Management Important Message (Signed)
Important Message  Patient Details  Name: [name] MRN: [id number] Date of Birth: Jan 23, 1955   Medicare Important Message Given:  Yes    Marshell Garfinkel, RN 06/15/2015, 2:56 PM

## 2015-06-15 NOTE — Evaluation (Signed)
Physical Therapy Evaluation Patient Details Name: [name] MRN: [id number] DOB: 04-17-1955 Today's Date: 06/15/2015   History of Present Illness  Pt admitted for plueral effusion and R lung empyema. Pt with thoracentesis on 06/14/15. Pt with history of rectal cancer and is s/p colectomy. Pt also with active squamous cell carcinoma and is unable to tolerate chemotherapy at this time. Other history includes schizophrenia, bipolar disorder, PTSD, and fibromylagia. Pt just recently admitted earlier this week with therapy evaluation on 06/11/2015 recommending SNF placement.  Clinical Impression  Pt is a pleasant 60 year old female who was admitted for pleural effusion and R lung empyema. Pt performs bed mobility with mod assist and is only able to maintain sitting for a limited amount of time prior to onset of pain/fatigue; pt then request to return back to bed. Pt demonstrates deficits with balance/strength/pain/endurance. Would benefit from skilled PT to address above deficits and promote optimal return to PLOF. Pt plans to transition home with hospice tomorrow.      Follow Up Recommendations  (home with hospice and 24/7 care)    Equipment Recommendations  Hospital bed    Recommendations for Other Services       Precautions / Restrictions Precautions Precautions: Fall Restrictions Weight Bearing Restrictions: No      Mobility  Bed Mobility Overal bed mobility: Needs Assistance Bed Mobility: Supine to Sit     Supine to sit: Mod assist     General bed mobility comments: assist for scooting out B LE and upright trunk elevation. Pt able to sit at EOB for approx 30 seconds before complaining of increased R abdominal pain and requests to return back supine. Unable to further progress mobility  Transfers                 General transfer comment: unable to perform at this time  Ambulation/Gait                Stairs            Wheelchair Mobility     Modified Rankin (Stroke Patients Only)       Balance Overall balance assessment: Needs assistance Sitting-balance support: Feet supported;Bilateral upper extremity supported Sitting balance-Leahy Scale: Fair Sitting balance - Comments: poor trunk strength                                     Pertinent Vitals/Pain Pain Assessment: Faces Faces Pain Scale: Hurts whole lot Pain Location: R side abdomen with exertion Pain Descriptors / Indicators: Cramping;Contraction Pain Intervention(s): Limited activity within patient's tolerance    Home Living Family/patient expects to be discharged to:: Private residence Living Arrangements: Children (will be moving to daughter's house) Available Help at Discharge: Family Type of Home: House Home Access: Stairs to enter Entrance Stairs-Rails: None Entrance Stairs-Number of Steps: 3 Home Layout: One level Home Equipment: Environmental consultant - 2 wheels      Prior Function Level of Independence: Needs assistance         Comments: previously was able to ambulate with RW in home, however reports that she has been bedbound the last few days secondary to anxiety and fear of falling     Hand Dominance        Extremity/Trunk Assessment   Upper Extremity Assessment: Generalized weakness (B UE grossly 3+/5)           Lower Extremity Assessment: Generalized weakness (B LE grossly  3+/5)         Communication   Communication: No difficulties  Cognition Arousal/Alertness: Awake/alert Behavior During Therapy: Anxious Overall Cognitive Status: Impaired/Different from baseline                      General Comments      Exercises        Assessment/Plan    PT Assessment Patient needs continued PT services  PT Diagnosis Difficulty walking;Generalized weakness;Acute pain   PT Problem List Decreased strength;Decreased activity tolerance;Decreased balance;Decreased coordination;Decreased knowledge of use of DME;Pain   PT Treatment Interventions Gait training;DME instruction;Therapeutic exercise   PT Goals (Current goals can be found in the Care Plan section) Acute Rehab PT Goals Patient Stated Goal: to get stronger PT Goal Formulation: With patient Time For Goal Achievement: 06/29/15 Potential to Achieve Goals: Fair    Frequency Min 2X/week   Barriers to discharge        Co-evaluation               End of Session Equipment Utilized During Treatment: Oxygen Activity Tolerance: Patient limited by pain Patient left: in bed;with bed alarm set Nurse Communication: Mobility status         Time: 1552-1600 PT Time Calculation (min) (ACUTE ONLY): 8 min   Charges:   PT Evaluation $PT Eval Moderate Complexity: 1 Procedure     PT G Codes:        Lessa Huge June 28, 2015, 5:06 PM  Greggory Stallion, PT, DPT [phone]

## 2015-06-15 NOTE — Progress Notes (Signed)
[name] NAME: [name]    MR#:  [id number]  DATE OF BIRTH:  10-10-1955  SUBJECTIVE:  CHIEF COMPLAINT:  Patient is resting comfortably. Has chronic abdominal and back pain Had ultrasound guided thoracentesis 6/8. Tolerated procedure well. Right-sided sutures were removed by the RN  REVIEW OF SYSTEMS:  Constitutional: Positive for malaise/fatigue. Negative for fever and chills.  HENT: Negative for sore throat and tinnitus.  Eyes: Negative for blurred vision and redness.  Respiratory: Denies any shortness of breath. Negative for cough.  Cardiovascular: Negative for chest pain, palpitations, orthopnea and PND.  Gastrointestinal: Negative for nausea, vomiting, abdominal pain and diarrhea.  Genitourinary: Negative for dysuria, urgency and frequency.  Musculoskeletal: Negative for myalgias and joint pain.  Skin: Negative for rash.   No lesions  Neurological: Negative for speech change, focal weakness and weakness.  Endo/Heme/Allergies: Does not bruise/bleed easily.   No temperature intolerance  Psychiatric/Behavioral: Negative for depression and suicidal ideas.   DRUG ALLERGIES:   Allergies  Allergen Reactions  . Fish Allergy Anaphylaxis    Patient allergic to perch only. She can eat other fish.  . Albuterol Sulfate Itching and Swelling    Patient states the inhaler causes a reaction, but not the nebs  . Sulfa Antibiotics Hives    VITALS:  Blood pressure 119/64, pulse 90, temperature 98 F (36.7 C), temperature source Oral, resp. rate 19, height 6' (1.829 m), weight 85.821 kg (189 lb 3.2 oz), SpO2 100 %.  PHYSICAL EXAMINATION:  GENERAL:  60 y.o.-year-old patient lying in the bed with no acute distress.  EYES: Pupils equal, round, reactive to light and accommodation. No scleral icterus. Extraocular muscles intact.  HEENT: Head atraumatic, normocephalic. Oropharynx and nasopharynx clear.  NECK:   Supple, no jugular venous distention. No thyroid enlargement, no tenderness.  LUNGS: Diminished breath sounds in the right lung field , no wheezing, rales,rhonchi or crepitation. No use of accessory muscles of respiration.  CARDIOVASCULAR: S1, S2 normal. No murmurs, rubs, or gallops.  ABDOMEN: Soft, nontender, nondistended. Bowel sounds present. No organomegaly or mass.  EXTREMITIES: No pedal edema, cyanosis, or clubbing.  NEUROLOGIC: Cranial nerves II through XII are intact. Muscle strength 5/5 in all extremities. Sensation intact. Gait not checked.  PSYCHIATRIC: The patient is alert and oriented x 3.  SKIN: No obvious rash, lesion, or ulcer.    LABORATORY PANEL:   CBC  Recent Labs Lab 06/15/15 0354  WBC 7.8  HGB 8.8*  HCT 27.5*  PLT 160   ------------------------------------------------------------------------------------------------------------------  Chemistries   Recent Labs Lab 06/12/15 2250 06/15/15 0354  NA 134*  --   K 4.1  --   CL 101  --   CO2 28  --   GLUCOSE 91  --   BUN 18  --   CREATININE 0.98 1.08*  CALCIUM 11.6*  --   AST 57*  --   ALT 15  --   ALKPHOS 160*  --   BILITOT 1.0  --    ------------------------------------------------------------------------------------------------------------------  Cardiac Enzymes  Recent Labs Lab 06/09/15 2213  TROPONINI <0.03   ------------------------------------------------------------------------------------------------------------------  RADIOLOGY:  Dg Chest 2 View  06/14/2015  CLINICAL DATA:  60 year old female status post left thoracentesis. EXAM: CHEST  2 VIEW COMPARISON:  Prior chest x-ray 06/09/2015 FINDINGS: No evidence of a pneumothorax following left-sided thoracentesis. Inspiratory volumes remain very low with right greater than left basilar atelectasis. Persistent pleural thickening in the superolateral aspect of the right hemi thorax. Stable  position of right IJ approach single-lumen power  injectable port catheter with the catheter tip overlying the distal IVC. IMPRESSION: No evidence of pneumothorax following left-sided thoracentesis. Persistent very low inspiratory volumes with right greater than left basilar atelectasis. Electronically Signed   By: Jacqulynn Cadet M.D.   On: 06/14/2015 11:30   US Thoracentesis Asp Pleural Space W/img Guide  06/14/2015  INDICATION: 60 year old female with a history of lung cancer and bilateral pleural effusions. EXAM: ULTRASOUND GUIDED LEFT THORACENTESIS MEDICATIONS: None. COMPLICATIONS: None immediate. PROCEDURE: An ultrasound guided thoracentesis was thoroughly discussed with the patient and questions answered. The benefits, risks, alternatives and complications were also discussed. The patient understands and wishes to proceed with the procedure. Written consent was obtained. Ultrasound was performed to localize and mark an adequate pocket of fluid in the left chest. The area was then prepped and draped in the normal sterile fashion. 1% Lidocaine was used for local anesthesia. Under ultrasound guidance a 6 Fr Safe-T-Centesis catheter was introduced. Thoracentesis was performed. The catheter was removed and a dressing applied. FINDINGS: A total of approximately 350 mL of minimally bloody pleural fluid was removed. Samples were sent to the laboratory as requested by the clinical team. IMPRESSION: Successful ultrasound guided left thoracentesis yielding 350 mL of pleural fluid. Electronically Signed   By: Jacqulynn Cadet M.D.   On: 06/14/2015 11:29    EKG:   Orders placed or performed during the hospital encounter of 06/09/15  . EKG 12-Lead  . EKG 12-Lead  . ED EKG  . ED EKG  . EKG 12-Lead  . EKG 12-Lead   *Note: Due to a large number of results and/or encounters for the requested time period, some results have not been displayed. A complete set of results can be found in Results Review.    ASSESSMENT AND PLAN:     This is a 60 year old  female admitted for pleural effusion and empyema of the right lung. 1. Pleural effusion:  secondary to malignancy. 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 Faith Rogue  Continue O2 via nasal cannula. Ultrasound-guided Paracentesis is Performed on 06/14/2015 and sent  for pathology as recommended by Dr. Faith Rogue  appreciate Thoracic surgery recommendations . Okay to discharge patient from thoracic surgery standpoint if medically stable  2. Lung cancer: Dr. Mike Gip has recommended palliative care consult, but it was discontinued by Dr. Megan Salon as all issues were addressed CODE STATUS has been changed to DNR/DNI by oncologist If patient clinically improves chemotherapy can be addressed during outpatient follow-up  3. COPD: Continue her corticosteroid and Spiriva. Albuterol as needed  4. Bipolar depression: Continue Abilify   5. DVT prophylaxis: Resume Eliquis; continue SCDs  6. GI prophylaxis: None  Disposition home with hospice care Hospital bed will be arranged by a.m. as reported by the social worker  All the records are reviewed and case discussed with Care Management/Social Workerr. Management plans discussed with the patient, she is  in agreement.  CODE STATUS:DNR    TOTAL TIME TAKING CARE OF THIS PATIENT: 35  minutes.   POSSIBLE D/C IN am DAYS, DEPENDING ON CLINICAL CONDITION.  Note: This dictation was prepared with Dragon dictation along with smaller phrase technology. Any transcriptional errors that result from this process are unintentional.   Nicholes Mango M.D on 06/15/2015 at 3:25 PM  Between 7am to 6pm - Pager - [phone] After 6pm go to [email or web address] - password Child psychotherapist Hospitalists  Office  [phone]  CC: Primary care physician; [name], NP

## 2015-06-15 NOTE — Consult Note (Signed)
   [facility] CM Inpatient Consult   06/15/2015  [name] [birth year] [id number]  [name] is an active participant in Scientist, clinical (histocompatibility and immunogenetics) services. Went by to see patient. Daughter was at bedside and informed me her mom was accepting Hospice care.  Daughter stated she was relieved and thankful for her mom's decision as her care at home had gotten extremely difficult. Will make community care RN aware of patient's transition. For any concerns or questions please call:  Daytona Retana RN, [facility] Liaison  [phone]) Business Mobile [phone]) Toll free office

## 2015-06-15 NOTE — Progress Notes (Signed)
New referral for [facility] services at home following discharge received from [facility]. Ms. Rampersaud is a 60 year old woman with known history of stage IV anal cancer admitted to [facility] on 6/7 with a malignant pleural effusion. She had a thoracentesis on 6/8 with 350 cc of fluid drawn off, pathology pending.  Patient has spoken with oncologist Dr. Mike Gip and no further treatment is planned, she has agreed to hospice services. Writer met in the patient's room with she and her daughter April. Patient was alert, confused at times, did appear aware of plan for discharge with hospice. Writer initiated education regarding hospice services, philosophy and team approach to care with good understanding voiced. Hospice information and contact number given to April. Hospital bed has been requested, this will be delivered tomorrow 6/10 by 12 noon. Hospital care team made aware. Patient will need EMS transport home as well as signed portable DNR . Patient information faxed to referral. April to contact hospice at time of discharge. Thank you for the opportunity to be involved in the care of this patient and her family. Flo Shanks RN, BSN, [facility], [facility] [phone]

## 2015-06-15 NOTE — Progress Notes (Signed)
[facility] Hematology/Oncology Progress Note  Date of admission: 06/12/2015  Hospital day:  06/14/2015  Chief Complaint: [name] is a 60 y.o. female with metastatic anal cancer who was admitted with shortness of breath and a loculated right sided pleural effusion.  HPI:  The patient was admitted to [facility] from 06/09/2015 - 06/11/2015 with chest pain and weakness.  She received 2 units of PRBCS secondary to anemia.  Chest pain was non-cardiac and felt secondary to metastatic disease.  She was discharged home with home health.  She represented within 24 hours with shortness of breath.  Abdomen and pelvis CT revealed increased ascites, a moderate left sided pleural effusion, and a loculated right pleural effusion. She has a history of right sided talc pleurodesis on 04/10/2015.  She underwent left sided thoracentesis of 350 cc minimally bloody pleural fluid on 06/14/2015.  Pathology is pending.  She was seen by Dr. Genevive Bi.  Symptomatically, she feels weak and tired.  She is is unable to care for herself.  She states that she is going to her daughter's after discharge.  Social History: The patient is alone today.  Allergies:  Allergies  Allergen Reactions  . Fish Allergy Anaphylaxis    Patient allergic to perch only. She can eat other fish.  . Albuterol Sulfate Itching and Swelling    Patient states the inhaler causes a reaction, but not the nebs  . Sulfa Antibiotics Hives    Scheduled Medications: . apixaban  2.5 mg Oral BID  . ARIPiprazole  2 mg Oral Daily  . cholecalciferol  1,000 Units Oral Daily  . docusate sodium  100 mg Oral BID  . DULoxetine  60 mg Oral [id number]  . feeding supplement (ENSURE ENLIVE)  237 mL Oral BID BM  . ferrous sulfate  325 mg Oral BID WC  . folic acid  1 mg Oral Daily  . mometasone-formoterol  2 puff Inhalation BID  . nicotine  21 mg Transdermal Daily  . nystatin  5 mL Oral QID  . pantoprazole  40 mg Oral BID AC  . polyethylene  glycol  17 g Oral Daily  . sodium chloride flush  3 mL Intravenous Q12H  . tiotropium  18 mcg Inhalation Daily  . vitamin B-12  1,000 mcg Oral Daily    Review of Systems: GENERAL: Fatigue. No fevers or sweats. Weight loss. PERFORMANCE STATUS (ECOG): 3-4 HEENT: No visual changes, runny nose, mouth sores or sore throat. Lungs: Shortness of breath. Rare cough. No hemoptysis. Cardiac: No chest pain, palpitations, orthopnea, or PND. GI: Colostomy. Denies abdominal pain. Appetite poor. No nausea, constipation, melena or hematochezia. GU: No urgency, frequency, dysuria, or hematuria. Musculoskeletal: Back pain with 3 compression fractures (old).. No joint pain. No muscle tenderness. Extremities: Swelling in lower legs (chronic). Skin: No rashes or ulcers. Neuro: No headache, numbness or weakness, balance or coordination issues. Endocrine: No diabetes, thyroid issues, hot flashes or night sweats. Psych: No mood changes or depression.  Pain: Back pain. Abdominal discomfort. Review of systems: All other systems reviewed and found to be negative.  Physical Exam: Blood pressure 137/64, pulse 86, temperature 98.1 F (36.7 C), temperature source Oral, resp. rate 16, height 6' (1.829 m), weight 195 lb (88.451 kg), SpO2 96 %.  GENERAL: Fatigued appearing woman laying down on the medical unit in no acute distress.  MENTAL STATUS: Alert and oriented to person, place and time. HEAD: Shoulder length gray hair. Normocephalic, atraumatic, face symmetric, no Cushingoid features. EYES: Blue eyes. Pupils  equal round and reactive to light and accomodation. No conjunctivitis or scleral icterus. ENT: [place] in place. Oropharynx clear without lesion. Tongue normal. Mucous membranes moist.  RESPIRATORY: Poor respiratory excursion.No trales, wheezes or rhonchi. CARDIOVASCULAR: Regular rate and rhythm without murmur, rub or gallop. CHEST: Right sided port. No  erythema. ABDOMEN: Ostomy. Stool liquid brown (no blood). Soft, non-tender with active bowel sounds and no hepatosplenomegaly. No masses. LYMPH NODES: No palpable cervical, supraclavicular, axillary or inguinal adenopathy. SKIN: Norashes, ulcers or lesions. EXTREMITIES: Bilateral 1+ lower extremity edema. No skin discoloration or tenderness. No palpable cords. NEUROLOGICAL: Unremarkable. PSYCH: Appropriate.   Results for orders placed or performed during the hospital encounter of 06/12/15 (from the past 48 hour(s))  Lactate dehydrogenase (CSF, pleural or peritoneal fluid)     Status: Abnormal   Collection Time: 06/14/15  9:47 AM  Result Value Ref Range   LD, Fluid 71 (H) 3 - 23 U/L    Comment: (NOTE) Results should be evaluated in conjunction with serum values    Fluid Type-FLDH CYTOPLEU   Protein, pleural or peritoneal fluid     Status: None   Collection Time: 06/14/15  9:47 AM  Result Value Ref Range   Total protein, fluid <3.0 g/dL    Comment: (NOTE) No normal range established for this test Results should be evaluated in conjunction with serum values    Fluid Type-FTP CYTOPLEU   Body fluid cell count with differential     Status: Abnormal   Collection Time: 06/14/15  9:47 AM  Result Value Ref Range   Fluid Type-FCT CYTOPLEU    Color, Fluid PINK (A) YELLOW   Appearance, Fluid CLOUDY (A) CLEAR   WBC, Fluid 504 cu mm   Neutrophil Count, Fluid 58 %   Lymphs, Fluid 21 %   Monocyte-Macrophage-Serous Fluid 14 %   Eos, Fluid 7 %  Glucose, pleural or peritoneal fluid     Status: None   Collection Time: 06/14/15  9:47 AM  Result Value Ref Range   Glucose, Fluid 91 mg/dL    Comment: (NOTE) No normal range established for this test Results should be evaluated in conjunction with serum values    Fluid Type-FGLU CYTOPLEU   Gram stain     Status: None   Collection Time: 06/14/15  9:47 AM  Result Value Ref Range   Specimen Description PLEURAL    Special Requests NONE     Gram Stain      MODERATE WBC PRESENT,BOTH PMN AND MONONUCLEAR NO ORGANISMS SEEN Performed at [facility]    Report Status 06/14/2015 FINAL    *Note: Due to a large number of results and/or encounters for the requested time period, some results have not been displayed. A complete set of results can be found in Results Review.   Dg Chest 2 View  06/14/2015  CLINICAL DATA:  60 year old female status post left thoracentesis. EXAM: CHEST  2 VIEW COMPARISON:  Prior chest x-ray 06/09/2015 FINDINGS: No evidence of a pneumothorax following left-sided thoracentesis. Inspiratory volumes remain very low with right greater than left basilar atelectasis. Persistent pleural thickening in the superolateral aspect of the right hemi thorax. Stable position of right IJ approach single-lumen power injectable port catheter with the catheter tip overlying the distal IVC. IMPRESSION: No evidence of pneumothorax following left-sided thoracentesis. Persistent very low inspiratory volumes with right greater than left basilar atelectasis. Electronically Signed   By: Jacqulynn Cadet M.D.   On: 06/14/2015 11:30   US Thoracentesis Asp Pleural Space W/img  Guide  06/14/2015  INDICATION: 60 year old female with a history of lung cancer and bilateral pleural effusions. EXAM: ULTRASOUND GUIDED LEFT THORACENTESIS MEDICATIONS: None. COMPLICATIONS: None immediate. PROCEDURE: An ultrasound guided thoracentesis was thoroughly discussed with the patient and questions answered. The benefits, risks, alternatives and complications were also discussed. The patient understands and wishes to proceed with the procedure. Written consent was obtained. Ultrasound was performed to localize and mark an adequate pocket of fluid in the left chest. The area was then prepped and draped in the normal sterile fashion. 1% Lidocaine was used for local anesthesia. Under ultrasound guidance a 6 Fr Safe-T-Centesis catheter was introduced. Thoracentesis  was performed. The catheter was removed and a dressing applied. FINDINGS: A total of approximately 350 mL of minimally bloody pleural fluid was removed. Samples were sent to the laboratory as requested by the clinical team. IMPRESSION: Successful ultrasound guided left thoracentesis yielding 350 mL of pleural fluid. Electronically Signed   By: Jacqulynn Cadet M.D.   On: 06/14/2015 11:29    Assessment:  [name] is a 60 y.o. female with stage IV anal cancer. She presented in 05/2014 with a 6 month history of an 80 pound weight and a 2 month history of fecal incontinence and progressive pain.  Chest, abdomen, and pelvic CT scan on 05/19/2014 revealed no evidence of metastatic disease. There was a 4 mm RUL pulmonary nodule. There were borderline (13 mm) enlarged iliac nodes. There was a 4.7 x 4.1 x 6.0 cm lower rectal and anal mass consistent with anal cancer.  She underwent diverting colostomy on 05/26/2014.   She received concurrent chemotherapy (5FU and mitomycin-C) and radiation (06/29/2014 - 07/31/2014). She missed 3 days of radiation (07/04-07/06/2014). She has been off radiation since 08/23/2014 secondary to perirectal breakdown. She received hyperbaric oxygen.   She was admitted to [facility] from 02/26/2015 - 03/03/2015 with a COPD exacerbation, RLL pneumonia, and small bowel obstruction. She was admitted to [facility] from 03/05/2015 - 03/07/2015 with health care associated pneumonia. She was admitted to [facility] from 03/15/2015 - 03/18/2015 with recurrent pneumonia. She was admitted to [facility] from 04/08/2015 - 04/17/2015 with progressive shortness of breath and a large right sided pleural effusion. 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  She was admitted to [facility] from 04/17/2015 - 04/21/2015 with GI bleed and melena. EGD and colonoscopy on 04/19/2015 revealed only diverticulosis in the sigmoid colon. She was  admitted to [facility] from 04/22/2015 - 04/24/2015 with abdominal pain, generalized weakness and hypokalemia. She was admitted to [facility] from 06/09/2015 - 06/11/2015 with chest pain and weakness.  PET scan on 05/03/2015 revealed no evidence of hypermetabolic local tumor recurrence in the anus. There was new hypermetabolic right paratracheal and subcarinal mediastinal lymphadenopathy. There was extensive hypermetabolism throughout the right pleural space. There was a new mildly metabolic 0.7 cm nodule associated with the minor fissure. There was no definite hypermetabolic metastatic disease in the abdomen or pelvis. There was focal hypermetabolism in the left abdomen at the site of nondilated proximal small bowel loop with no associated mass or small bowel wall thickening.   Despite several attempts at initiation of chemotherapy, she has continued to require admission with ongoing decline in health and performance status (3-4).  She is unable to tolerate high dose cisplatin and 5-fluorouracil at this time.    Symptomatically, she is weak and fatigued.  Exam reveals general debilitation.  She is bed bound.  Plan: 1.  Discuss general health.  Patient agrees that she is unable to tolerate chemotherapy.  She is going home to her daughter's house.  She states that her daughter, April, has medical power of attorney.  We discussed Hospice care.  Patient is agreeable.  We discussed code status issues.  She does not want heroic measures.  Code status is DNR/DNI. 2.  Palliative Care Consult [facility]). 3.  Code status DNR/DNI. 4.  If patient improves, chemotherapy can be readdressed in the outpatient department.   Lequita Asal, MD  06/14/2015

## 2015-06-15 NOTE — Progress Notes (Signed)
Palliative Care Update   I have talked with Hospice Liaison about consults for Palliative Care and also for Hospice in the Home ---and whether or not my services are needed.    They are not.   It turns out that pt and Dr Mike Gip already are in full agreement with the plan for pt to go home with hospice care and the selection of which hospice has already been made. I am not needed to adjust med recs either. And pt has chosen to be DNR / DNI already.   Therefore, I am discontinuing the palliative care consult as this is not needed in this particular instance since all above is already decided and taken care of.  Hospice Liaison agrees with this.  Colleen Can MD Palliative Care

## 2015-06-16 MED ORDER — ACETAMINOPHEN 325 MG PO TABS: 325.0000 mg | ORAL_TABLET | Freq: Four times a day (QID) | ORAL | Status: AC | PRN

## 2015-06-16 MED ORDER — OXYCODONE HCL 10 MG PO TABS: 10.0000 mg | ORAL_TABLET | Freq: Four times a day (QID) | ORAL | Status: DC | PRN

## 2015-06-16 NOTE — Discharge Summary (Signed)
[facility] NAME: [name]    MR#:  [id number]  DATE OF BIRTH:  [birth year]  DATE OF ADMISSION:  06/12/2015 ADMITTING PHYSICIAN: [name], MD  DATE OF DISCHARGE: 06/16/2015 PRIMARY CARE PHYSICIAN: [name], NP    ADMISSION DIAGNOSIS:  Pleural effusion [J90] Empyema ([place]) [J86.9] Ascites [R18.8] Abdominal pain, unspecified abdominal location [R10.9]  DISCHARGE DIAGNOSIS:  Pleural effusion-malignant Lung cancer Squamous cell and a rectal cancer-colostomy  SECONDARY DIAGNOSIS:   Past Medical History  Diagnosis Date  . Schizophrenia ([name])   . Asthma   . GERD (gastroesophageal reflux disease)   . Anxiety   . Depression   . Bipolar disorder ([place])   . COPD (chronic obstructive pulmonary disease) ([place])   . Occasional tremors     right hand  . PTSD (post-traumatic stress disorder)   . Shortness of breath dyspnea   . Fibromyalgia   . DVT (deep venous thrombosis) ([place]) 2011    RUE  . Thyroid nodule   . DDD (degenerative disc disease), lumbar   . Spinal stenosis   . Peripheral neuropathy ([place])   . Rotator cuff tear     right  . Pneumonia 2011  . Hypothyroidism     no meds currently  . Anemia     during pregnancy only  . Hypertension     Off meds x 15 years-well controlled now per pt  . Severe obstructive sleep apnea 06/27/2014  . DVT of upper extremity (deep vein thrombosis) ([place]) 10/14/2014  . Squamous cell cancer, anus (HCC)   . Lung cancer ([place])   . Hemorrhoid 06/27/2014  . SBO (small bowel obstruction) [facility])     HOSPITAL COURSE:  This is a 60 year old female admitted for pleural effusion and empyema of the right lung. 1. Pleural effusion: secondary to malignancy. 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 Faith Rogue Continue O2 via nasal cannula. Ultrasound-guided Paracentesis is Performed on 06/14/2015 and sent  for pathology as recommended by Dr. Faith Rogue appreciate Thoracic surgery recommendations . Thoracic fluid with no growth so far. PCP and oncology to follow up on the final result  Okay to discharge patient from thoracic surgery standpoint if medically stable  2. Lung cancer: Dr. Mike Gip has recommended palliative care consult, but it was discontinued by Dr. Megan Salon as all issues were addressed. Patient is DO NOT RESUSCITATE and getting discharged with home hospice hospital bed ordered which will be delivered today. CODE STATUS has been changed to DNR/DNI by oncologist If patient clinically improves chemotherapy can be addressed during outpatient follow-up by oncologist  3. COPD: Continue her corticosteroid and Spiriva. Albuterol as needed  4. Bipolar depression: Continue Abilify   5. DVT prophylaxis: Resume Eliquis; continue SCDs  6. GI prophylaxis: None   DISCHARGE CONDITIONS:   Fair  CONSULTS OBTAINED:      PROCEDURES Ultrasound-guided thoracentesis  DRUG ALLERGIES:   Allergies  Allergen Reactions  . Fish Allergy Anaphylaxis    Patient allergic to perch only. She can eat other fish.  . Albuterol Sulfate Itching and Swelling    Patient states the inhaler causes a reaction, but not the nebs  . Sulfa Antibiotics Hives    DISCHARGE MEDICATIONS:   Current Discharge Medication List    START taking these medications   Details  acetaminophen (TYLENOL) 325 MG tablet Take 1 tablet (325 mg total) by mouth every 6 (six) hours as needed for  moderate pain.    oxyCODONE 10 MG TABS Take 1 tablet (10 mg total) by mouth every 6 (six) hours as needed for moderate pain. Qty: 30 tablet, Refills: 0      CONTINUE these medications which have NOT CHANGED   Details  albuterol (PROVENTIL) (2.5 MG/3ML) 0.083% nebulizer solution Take 3 mLs (2.5 mg total) by nebulization every 4 (four) hours. Qty: 360 vial, Refills: 12    apixaban (ELIQUIS) 2.5 MG TABS tablet Take 1 tablet (2.5 mg total)  by mouth 2 (two) times daily. Qty: 60 tablet, Refills: 3    ARIPiprazole (ABILIFY) 2 MG tablet Take 1 tablet (2 mg total) by mouth daily. Qty: 30 tablet, Refills: 4    aspirin EC 81 MG EC tablet Take 1 tablet (81 mg total) by mouth daily. Qty: 30 tablet, Refills: 1    budesonide-formoterol (SYMBICORT) 160-4.5 MCG/ACT inhaler Inhale 2 puffs into the lungs 2 (two) times daily. Qty: 1 Inhaler, Refills: 12    cholecalciferol 1000 units tablet Take 1 tablet (1,000 Units total) by mouth daily. Qty: 30 tablet, Refills: 0    clonazePAM (KLONOPIN) 1 MG tablet Take 1 tablet (1 mg total) by mouth 3 (three) times daily as needed for anxiety. Qty: 90 tablet, Refills: 4    docusate sodium (COLACE) 100 MG capsule Take 100 mg by mouth 2 (two) times daily.    DULoxetine (CYMBALTA) 60 MG capsule Take 60 mg by mouth every morning.     ENSURE (ENSURE) Take 237 mLs by mouth 2 (two) times daily between meals. Reported on 05/14/2015   Associated Diagnoses: Squamous cell cancer, anus ([place]); Anemia, unspecified anemia type; Localized swelling, mass, or lump of lower extremity, bilateral; B12 deficiency    ferrous sulfate 325 (65 FE) MG tablet Take 1 tablet (325 mg total) by mouth 2 (two) times daily with a meal. Qty: 60 tablet, Refills: 0    folic acid (FOLVITE) 1 MG tablet Take 1 tablet (1 mg total) by mouth daily. Qty: 30 tablet, Refills: 3    LYRICA 200 MG capsule Take 200 mg by mouth 2 (two) times daily as needed (pain). Reported on 05/15/2015    nicotine (NICODERM CQ - DOSED IN MG/24 HOURS) 21 mg/24hr patch Place 1 patch (21 mg total) onto the skin daily. Qty: 28 patch, Refills: 0    nystatin (MYCOSTATIN) 100000 UNIT/ML suspension Take 5 mLs (500,000 Units total) by mouth 4 (four) times daily. Qty: 60 mL, Refills: 2    ondansetron (ZOFRAN) 4 MG tablet Take 1 tablet (4 mg total) by mouth every 6 (six) hours as needed for nausea. Qty: 20 tablet, Refills: 0    oxyCODONE-acetaminophen (PERCOCET) 10-325  MG tablet Take 1 tablet by mouth every 6 (six) hours as needed. Reported on 04/30/2015 Qty: 112 tablet, Refills: 0    pantoprazole (PROTONIX) 40 MG tablet Take 40 mg by mouth 2 (two) times daily.     polyethylene glycol (MIRALAX / GLYCOLAX) packet Take 17 g by mouth daily. Qty: 14 each, Refills: 0    protein supplement (RESOURCE BENEPROTEIN) POWD Take 1 scoop by mouth 2 (two) times daily between meals.   Associated Diagnoses: Squamous cell cancer, anus ([place]); Anemia, unspecified anemia type; Localized swelling, mass, or lump of lower extremity, bilateral; B12 deficiency    tiotropium (SPIRIVA) 18 MCG inhalation capsule Place 1 capsule (18 mcg total) into inhaler and inhale daily. Qty: 30 capsule, Refills: 12    vitamin B-12 (CYANOCOBALAMIN) 1000 MCG tablet Take 1,000 mcg by mouth daily.  Reported on 05/14/2015   Associated Diagnoses: Squamous cell cancer, anus ([place]); Anemia, unspecified anemia type; Localized swelling, mass, or lump of lower extremity, bilateral; B12 deficiency    zolpidem (AMBIEN) 10 MG tablet Take 1 tablet (10 mg total) by mouth at bedtime as needed for sleep. Qty: 30 tablet, Refills: 4         DISCHARGE INSTRUCTIONS:   Activity as tolerated per PT recommendations Continue 2 L of home oxygen Regular diet with feeding supplements Follow-up with Dr. Mike Gip oncology in 1-2 weeks Follow-up with primary care physician in a week from hospice care  DIET:  Regular with With feeding supplements  DISCHARGE CONDITION:  Fair  ACTIVITY:  Activity as tolerated  OXYGEN:  Home Oxygen: Yes.     Oxygen Delivery: 2 liters/min via Patient connected to nasal cannula oxygen  DISCHARGE LOCATION:  home   If you experience worsening of your admission symptoms, develop shortness of breath, life threatening emergency, suicidal or homicidal thoughts you must seek medical attention immediately by calling 911 or calling your MD immediately  if symptoms less severe.  You Must  read complete instructions/literature along with all the possible adverse reactions/side effects for all the Medicines you take and that have been prescribed to you. Take any new Medicines after you have completely understood and accpet all the possible adverse reactions/side effects.   Please note  You were cared for by a hospitalist during your hospital stay. If you have any questions about your discharge medications or the care you received while you were in the hospital after you are discharged, you can call the unit and asked to speak with the hospitalist on call if the hospitalist that took care of you is not available. Once you are discharged, your primary care physician will handle any further medical issues. Please note that NO REFILLS for any discharge medications will be authorized once you are discharged, as it is imperative that you return to your primary care physician (or establish a relationship with a primary care physician if you do not have one) for your aftercare needs so that they can reassess your need for medications and monitor your lab values.     Today  Chief Complaint  Patient presents with  . Abdominal Pain   Patient is resting comfortably and doing fine today. Requesting hospital bed at home. Social worker is arranging hospice care at home. Denies any shortness of breath while resting discussed with patient's daughter Ms. April yesterday, agreeable with home hospice care and hospital bed  ROS:  CONSTITUTIONAL: Denies fevers, chills. Denies any fatigue, weakness.  EYES: Denies blurry vision, double vision, eye pain. EARS, NOSE, THROAT: Denies tinnitus, ear pain, hearing loss. RESPIRATORY: Denies cough, wheeze, shortness of breath.  CARDIOVASCULAR: Denies chest pain, palpitations, edema.  GASTROINTESTINAL: Denies nausea, vomiting, diarrhea, abdominal pain. Denies bright red blood per rectum. GENITOURINARY: Denies dysuria, hematuria. ENDOCRINE: Denies nocturia or  thyroid problems. HEMATOLOGIC AND LYMPHATIC: Denies easy bruising or bleeding. SKIN: Denies rash or lesion. MUSCULOSKELETAL: Denies pain in neck, back, shoulder, knees, hips or arthritic symptoms.  NEUROLOGIC: Denies paralysis, paresthesias.  PSYCHIATRIC: Denies anxiety or depressive symptoms.   VITAL SIGNS:  Blood pressure 155/134, pulse 90, temperature 98.2 F (36.8 C), temperature source Oral, resp. rate 16, height 6' (1.829 m), weight 89.086 kg (196 lb 6.4 oz), SpO2 93 %.  I/O:    Intake/Output Summary (Last 24 hours) at 06/16/15 1017 Last data filed at 06/16/15 0034  Gross per 24 hour  Intake  240 ml  Output    500 ml  Net   -260 ml    PHYSICAL EXAMINATION:  GENERAL:  60 y.o.-year-old patient lying in the bed with no acute distress.  EYES: Pupils equal, round, reactive to light and accommodation. No scleral icterus. Extraocular muscles intact.  HEENT: Head atraumatic, normocephalic. Oropharynx and nasopharynx clear.  NECK:  Supple, no jugular venous distention. No thyroid enlargement, no tenderness.  LUNGS: Moderate breath sounds bilaterally, no wheezing, rales,rhonchi or crepitation. No use of accessory muscles of respiration.  CARDIOVASCULAR: S1, S2 normal. No murmurs, rubs, or gallops.  ABDOMEN: Soft, non-tender, non-distended. Bowel sounds present. No organomegaly or mass.  EXTREMITIES: No pedal edema, cyanosis, or clubbing.  NEUROLOGIC: Cranial nerves II through XII are intact. Muscle strength 5/5 in all extremities. Sensation intact. Gait not checked.  PSYCHIATRIC: The patient is alert and oriented x 3.  SKIN: No obvious rash, lesion, or ulcer.   DATA REVIEW:   CBC  Recent Labs Lab 06/15/15 0354  WBC 7.8  HGB 8.8*  HCT 27.5*  PLT 160    Chemistries   Recent Labs Lab 06/12/15 2250 06/15/15 0354  NA 134*  --   K 4.1  --   CL 101  --   CO2 28  --   GLUCOSE 91  --   BUN 18  --   CREATININE 0.98 1.08*  CALCIUM 11.6*  --   AST 57*  --   ALT 15   --   ALKPHOS 160*  --   BILITOT 1.0  --     Cardiac Enzymes  Recent Labs Lab 06/09/15 2213  TROPONINI <0.03    Microbiology Results  Results for orders placed or performed during the hospital encounter of 06/12/15  Culture, body fluid-bottle     Status: None (Preliminary result)   Collection Time: 06/14/15  9:47 AM  Result Value Ref Range Status   Specimen Description PLEURAL  Final   Special Requests NONE  Final   Culture   Final    NO GROWTH 1 DAY Performed at [facility]    Report Status PENDING  Incomplete  Gram stain     Status: None   Collection Time: 06/14/15  9:47 AM  Result Value Ref Range Status   Specimen Description PLEURAL  Final   Special Requests NONE  Final   Gram Stain   Final    MODERATE WBC PRESENT,BOTH PMN AND MONONUCLEAR NO ORGANISMS SEEN Performed at [facility]    Report Status 06/14/2015 FINAL  Final   *Note: Due to a large number of results and/or encounters for the requested time period, some results have not been displayed. A complete set of results can be found in Results Review.    RADIOLOGY:  Dg Chest 2 View  06/14/2015  CLINICAL DATA:  60 year old female status post left thoracentesis. EXAM: CHEST  2 VIEW COMPARISON:  Prior chest x-ray 06/09/2015 FINDINGS: No evidence of a pneumothorax following left-sided thoracentesis. Inspiratory volumes remain very low with right greater than left basilar atelectasis. Persistent pleural thickening in the superolateral aspect of the right hemi thorax. Stable position of right IJ approach single-lumen power injectable port catheter with the catheter tip overlying the distal IVC. IMPRESSION: No evidence of pneumothorax following left-sided thoracentesis. Persistent very low inspiratory volumes with right greater than left basilar atelectasis. Electronically Signed   By: Jacqulynn Cadet M.D.   On: 06/14/2015 11:30   Ct Abdomen Pelvis W Contrast  06/13/2015  CLINICAL DATA:  60 year old female  with abdominal pain. EXAM: CT ABDOMEN AND PELVIS WITH CONTRAST TECHNIQUE: Multidetector CT imaging of the abdomen and pelvis was performed using the standard protocol following bolus administration of intravenous contrast. CONTRAST:  150m ISOVUE-300 IOPAMIDOL (ISOVUE-300) INJECTION 61% COMPARISON:  CT dated 03/15/2015 FINDINGS: There is a partially visualized moderate-sized left pleural effusion which is new from study dated 03/15/2015. There is associated partial compressive atelectasis of the visualized portion of the left lower lobe. There is a small loculated appearing right pleural effusion all with enhancement and nodularity of the visualized right posterior pleural concerning for pleural implants and malignant effusion. Superimposed infection can't empyema is not excluded. Patchy a consolidative change with air bronchogram at the right lung base may represent atelectasis or infiltrate. There is a 13 x 10 mm nodular density along the right minor fissure which may represent a small loculated pleural fluid versus a pleural implant. Right hilar adenopathy partially visualized measuring approximately 11 mm short axis. No intra-abdominal free air. There is moderate ascites, increased from prior study. There is slight irregularity of the hepatic contour. The gallbladder, pancreas, spleen, adrenal glands, kidneys, visualized ureters appear unremarkable. The urinary bladder is predominantly collapsed. The uterus is grossly unremarkable. Bilateral tubal ligation clips noted. The ovaries are not well visualized. There is a left lower quadrant ostomy or mucus fistula. There is colonic diverticulosis without definite evidence of acute diverticulitis. Evaluation is however limited due to ascites. Top-normal caliber loops of small bowel in the mid abdomen most likely represent a degree of ileus. No transition point identified to suggest bowel obstruction. Normal appendix. There is mild to moderate aortoiliac  atherosclerotic disease. There is a 4.5 cm infrarenal abdominal aortic aneurysm. Follow-up as previously recommended. No portal venous gas identified. There is no adenopathy. Clear there is diffuse subcutaneous soft tissue edema and anasarca. There is osteopenia. Multilevel lower thoracic compression fracture involving T9, T10, and T12 with near complete collapse of the T12) similar to prior study. No acute fracture identified. IMPRESSION: Partially visualized moderate left pleural effusion, new from prior study. Loculated right pleural effusion/empyema with findings concerning for pleural implants. A small nodular density along the right minor fissure may represent loculated fluid or metastatic disease. Moderate ascites, increased from prior study. Diffuse small bowel ileus without definite evidence of obstruction. 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 Colonic diverticulosis without definite evidence of diverticulitis. A 4.5 cm infrarenal abdominal aortic aneurysm. Follow-up as previously recommended. Anasarca.  2 Electronically Signed   By: AAnner CreteM.D.   On: 06/13/2015 00:40   [place]oracentesis Asp Pleural Space W/img Guide  06/14/2015  INDICATION: 60year old female with a history of lung cancer and bilateral pleural effusions. EXAM: ULTRASOUND GUIDED LEFT THORACENTESIS MEDICATIONS: None. COMPLICATIONS: None immediate. PROCEDURE: An ultrasound guided thoracentesis was thoroughly discussed with the patient and questions answered. The benefits, risks, alternatives and complications were also discussed. The patient understands and wishes to proceed with the procedure. Written consent was obtained. Ultrasound was performed to localize and mark an adequate pocket of fluid in the left chest. The area was then prepped and draped in the normal sterile fashion. 1% Lidocaine was used for local anesthesia. Under ultrasound guidance a 6 Fr Safe-T-Centesis catheter was introduced.  Thoracentesis was performed. The catheter was removed and a dressing applied. FINDINGS: A total of approximately 350 mL of minimally bloody pleural fluid was removed. Samples were sent to the laboratory as requested by the clinical team. IMPRESSION: Successful ultrasound guided left thoracentesis  yielding 350 mL of pleural fluid. Electronically Signed   By: Jacqulynn Cadet M.D.   On: 06/14/2015 11:29    EKG:   Orders placed or performed during the hospital encounter of 06/09/15  . EKG 12-Lead  . EKG 12-Lead  . ED EKG  . ED EKG  . EKG 12-Lead  . EKG 12-Lead   *Note: Due to a large number of results and/or encounters for the requested time period, some results have not been displayed. A complete set of results can be found in Results Review.      Management plans discussed with the patient, family and they are in agreement.  CODE STATUS:     Code Status Orders        Start     Ordered   06/15/15 1047  Do not attempt resuscitation (DNR)   Continuous    Question Answer Comment  In the event of cardiac or respiratory ARREST Do not call a "code blue"   In the event of cardiac or respiratory ARREST Do not perform Intubation, CPR, defibrillation or ACLS   In the event of cardiac or respiratory ARREST Use medication by any route, position, wound care, and other measures to relive pain and suffering. May use oxygen, suction and manual treatment of airway obstruction as needed for comfort.   Comments RN may pronounce      06/15/15 1046    Code Status History    Date Active Date Inactive Code Status Order ID Comments User Context   06/13/2015  4:29 AM 06/15/2015 10:46 AM Full Code [id number]  [name], MD Inpatient   06/10/2015  6:17 AM 06/11/2015  4:44 PM Full Code [id number]  [name], MD Inpatient   05/24/2015  2:39 PM 05/25/2015  3:49 PM Full Code [id number]  Fritzi Mandes, MD ED   04/23/2015 12:40 AM 04/24/2015  7:02 PM Full Code [id number]  Max Sane, MD Inpatient   04/18/2015   2:43 AM 04/21/2015  9:09 PM Full Code [id number]  [name], MD Inpatient   04/08/2015  2:01 PM 04/17/2015  7:16 PM Full Code [id number]  Idelle Crouch, MD ED   03/16/2015  1:41 AM 03/18/2015  6:23 PM Full Code [id number]  Lance Coon, MD ED   03/05/2015  6:17 AM 03/07/2015  8:09 PM Full Code [id number]  [name], MD Inpatient   02/26/2015  5:43 PM 03/03/2015  6:54 PM Full Code [id number]  Bettey Costa, MD Inpatient   10/14/2014 10:59 PM 10/17/2014  4:34 PM Full Code [id number]  Lytle Butte, MD ED   05/17/2014  8:38 PM 05/30/2014  4:55 PM Full Code [id number]  Fritzi Mandes, MD Inpatient    Advance Directive Documentation        Most Recent Value   Type of Advance Directive  Healthcare Power of Attorney   Pre-existing out of facility DNR order (yellow form or pink MOST form)     "MOST" Form in Place?        TOTAL TIME TAKING CARE OF THIS PATIENT: 45  minutes.   Note: This dictation was prepared with Dragon dictation along with smaller phrase technology. Any transcriptional errors that result from this process are unintentional.   '@MEC'$ @  on 06/16/2015 at 10:17 AM  Between 7am to 6pm - Pager - [phone]  After 6pm go to [email or web address] - password [email or web address] [facility]  Office  [phone]  CC: Primary care physician; [name], NP

## 2015-06-16 NOTE — Care Management Note (Signed)
Case Management Note  Patient Details  Name: KIMMIE BERGGREN MRN: [id number] Date of Birth: January 30, 1955  Subjective/Objective:   Daughter April Woods [phone] called and stated that she was awaiting the delivery of a hospital bed and a new home oxygen setup from hospice of [facility]. She stated that she could be reached at [phone] although it was not accepting calls earlier today. This Probation officer requested that Ms Sherral Hammers call either this Probation officer or Mrs [place]'s nurse Verne when the bed and oxygen are delivered so that EMS transportation to home can be arranged for Ms Duquette.                  Action/Plan:   Expected Discharge Date:  06/15/15               Expected Discharge Plan:     In-House Referral:     Discharge planning Services     Post Acute Care Choice:    Choice offered to:     DME Arranged:    DME Agency:     HH Arranged:    HH Agency:     Status of Service:     Medicare Important Message Given:  Yes Date Medicare IM Given:    Medicare IM give by:    Date Additional Medicare IM Given:    Additional Medicare Important Message give by:     If discussed at [place] of Stay Meetings, dates discussed:    Additional Comments:  Hansen Carino A, RN 06/16/2015, 10:41 AM

## 2015-06-16 NOTE — Discharge Instructions (Signed)
Activity as tolerated per PT recommendations Continue 2 L of home oxygen Regular diet with feeding supplements Follow-up with Dr. Mike Gip oncology in 1-2 weeks Follow-up with primary care physician in a week from hospice care

## 2015-06-19 ENCOUNTER — Inpatient Hospital Stay: Payer: Commercial Managed Care - HMO | Admitting: Unknown Physician Specialty

## 2015-06-19 ENCOUNTER — Telehealth: Payer: Self-pay

## 2015-06-19 ENCOUNTER — Other Ambulatory Visit: Payer: Self-pay | Admitting: *Deleted

## 2015-06-19 ENCOUNTER — Encounter: Payer: Self-pay | Admitting: *Deleted

## 2015-06-19 ENCOUNTER — Ambulatory Visit: Payer: Self-pay | Admitting: Unknown Physician Specialty

## 2015-06-19 ENCOUNTER — Telehealth: Payer: Self-pay | Admitting: Unknown Physician Specialty

## 2015-06-19 LAB — COMP PANEL: LEUKEMIA/LYMPHOMA

## 2015-06-19 LAB — PH, BODY FLUID: PH, BODY FLUID: 7

## 2015-06-19 MED ORDER — CLINDAMYCIN HCL 300 MG PO CAPS: 300.0000 mg | ORAL_CAPSULE | Freq: Three times a day (TID) | ORAL | Status: AC

## 2015-06-19 NOTE — Telephone Encounter (Signed)
[facility] called to give some results for this patient. They stated that the patient had a pleural fluid culture done and it was positive with gram positive cocci. Wanted to call and give [name] these results.

## 2015-06-19 NOTE — Patient Outreach (Signed)
Follow up call- spoke with daughter April, reports pt unable to talk, confirmed Hospice services has started.    This RN CM to close case.     Plan to send by in basket case closure letter to [name] NP.   Case closure letter to be sent to pt.   Plan to inform [facility] care management assistant to close case.    [name]   [place] Care Management  [phone]

## 2015-06-19 NOTE — Patient Outreach (Signed)
[facility] difficult case discussion: update provided that pt discharged 6/10 with home Hospice, to close case.     [name]   [place] Care Management  [phone]

## 2015-06-19 NOTE — Telephone Encounter (Signed)
Talked to daughter who wishes for her to continue to get antibiotics if needed.  I will give her a prescription for Clindamyacin for tx of Gram positive cocci.Marland Kitchen

## 2015-06-20 ENCOUNTER — Other Ambulatory Visit: Payer: Self-pay | Admitting: *Deleted

## 2015-06-20 LAB — CULTURE, BODY FLUID W GRAM STAIN -BOTTLE

## 2015-06-20 NOTE — Patient Outreach (Signed)
[facility]) Care Management  06/20/2015  [name] 08-11-1955 [id number]   Patient's case closed to [facility] care management at this time as she is now receiving Hospice Care.    [name] [facility] Care Management [phone]

## 2015-06-22 ENCOUNTER — Other Ambulatory Visit: Payer: Self-pay | Admitting: *Deleted

## 2015-06-22 MED ORDER — OXYCODONE HCL 10 MG PO TABS: 10.0000 mg | ORAL_TABLET | Freq: Four times a day (QID) | ORAL | Status: DC | PRN

## 2015-07-02 ENCOUNTER — Other Ambulatory Visit: Payer: Self-pay | Admitting: *Deleted

## 2015-07-02 MED ORDER — MORPHINE SULFATE (CONCENTRATE) 20 MG/ML PO SOLN: 10.0000 mg | Freq: Four times a day (QID) | ORAL | Status: AC

## 2015-07-02 MED ORDER — OXYCODONE HCL 10 MG PO TABS: 10.0000 mg | ORAL_TABLET | Freq: Four times a day (QID) | ORAL | Status: AC | PRN

## 2015-08-01 ENCOUNTER — Ambulatory Visit: Payer: Commercial Managed Care - HMO | Admitting: Radiation Oncology

## 2015-08-07 DEATH — deceased

## 2015-12-21 ENCOUNTER — Other Ambulatory Visit: Payer: Self-pay | Admitting: Nurse Practitioner

## 2016-07-08 NOTE — Telephone Encounter (Signed)
done

## 2017-08-05 IMAGING — CT CT ABD-PELV W/ CM
1 of 3 series · 13 of 32 positions shown, 17 images · IV contrast (omnipaque)
Comparison: Portable chest 02/26/2015. Chest CTA 10/22/2014.
Restaging chest abdomen and pelvis CT 05/19/2014.

CLINICAL DATA: 59-year-old female with epigastric abdominal pain.
Initial encounter. Personal history of anal cancer.

EXAM:
CT ABDOMEN AND PELVIS WITH CONTRAST
TECHNIQUE: Multidetector CT imaging of the abdomen and pelvis was performed
using the standard protocol following bolus administration of
intravenous contrast.
CONTRAST:  100mL OMNIPAQUE IOHEXOL 300 MG/ML  SOLN

[Series 2: routine abd pel with · axial · 0.83mm/px · z∈[-710,-250]mm · 13 of 104 slices shown, 17 images]
[im 6/104  soft-tissue]
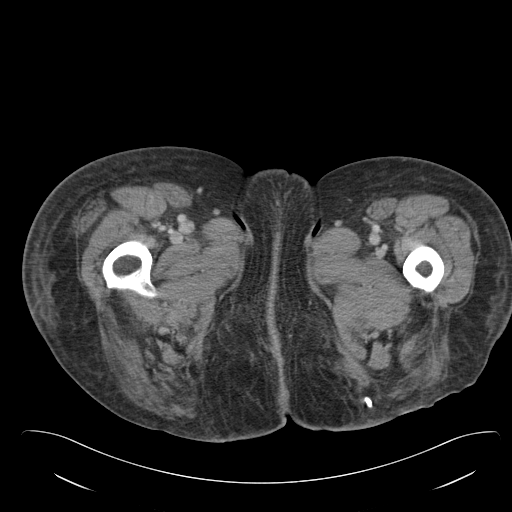
[im 6/104  bone]
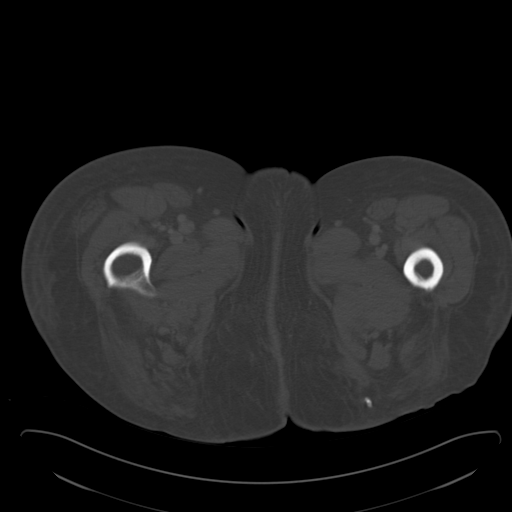
[im 16/104  soft-tissue]
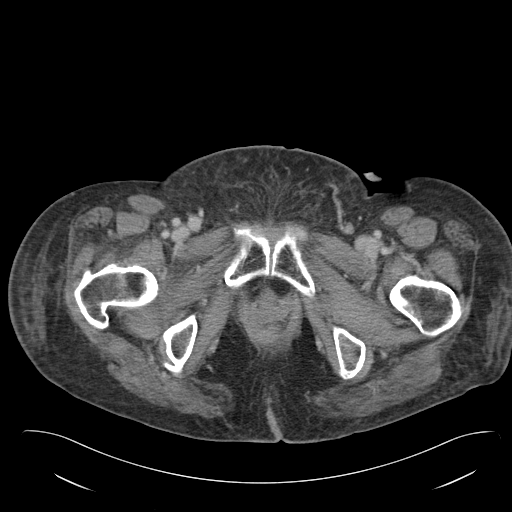
[im 26/104  soft-tissue]
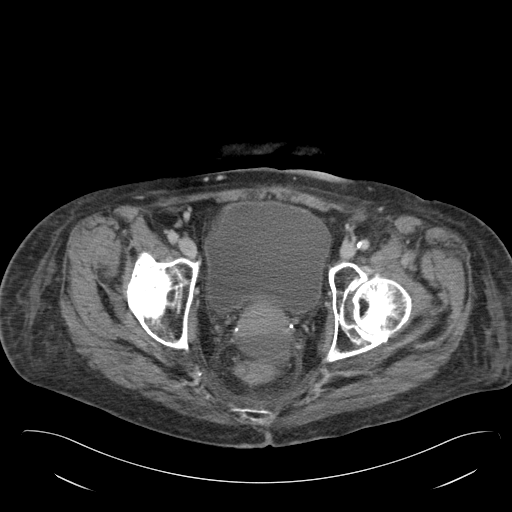
[im 37/104  soft-tissue]
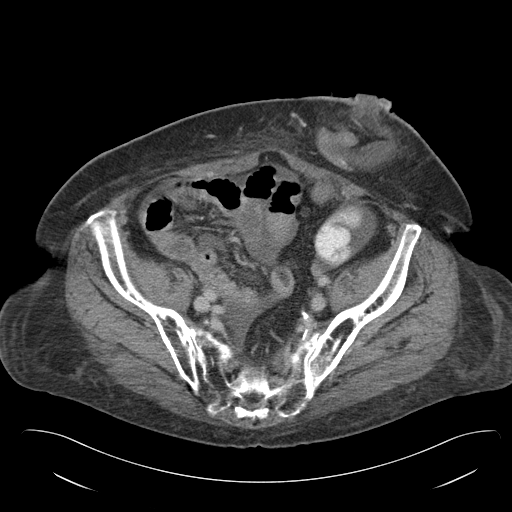
[im 42/104  soft-tissue]
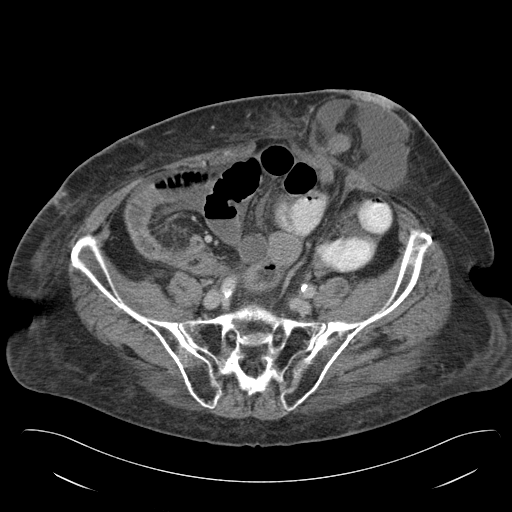
[im 52/104  soft-tissue]
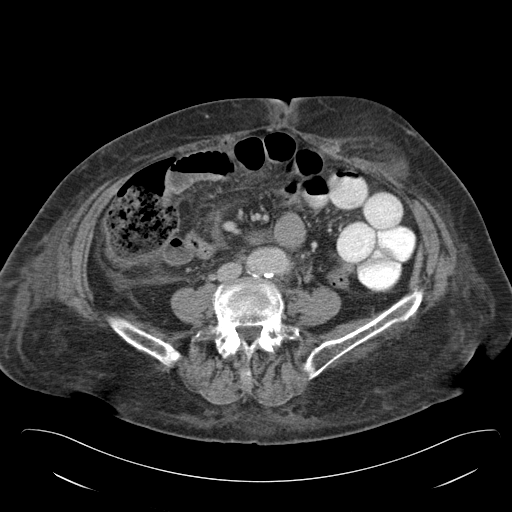
[im 62/104  soft-tissue]
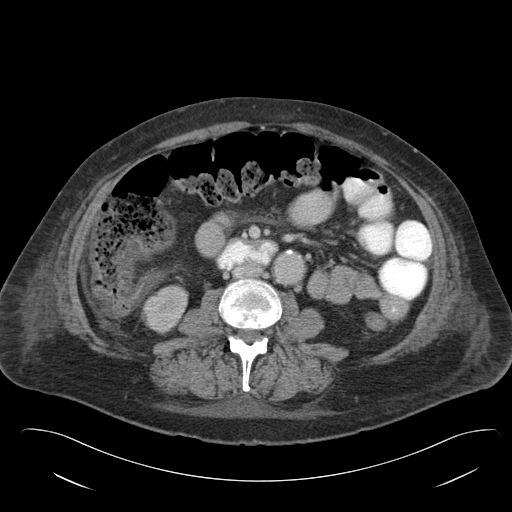
[im 67/104  soft-tissue]
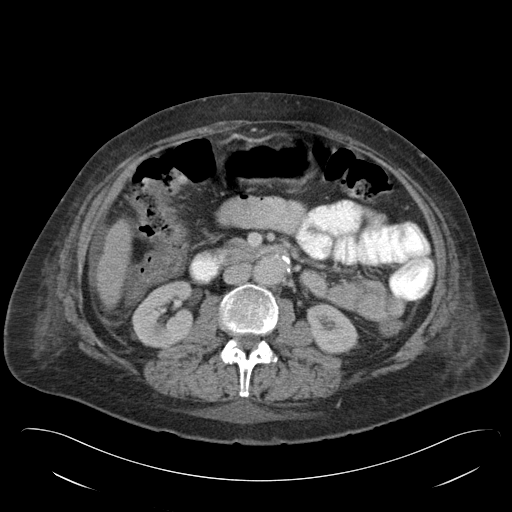
[im 78/104  soft-tissue]
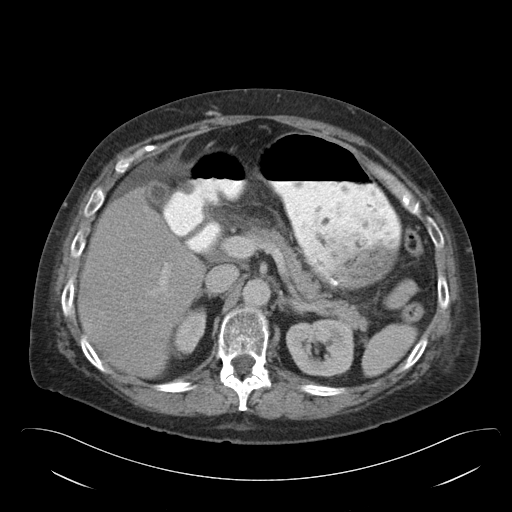
[im 78/104  bone]
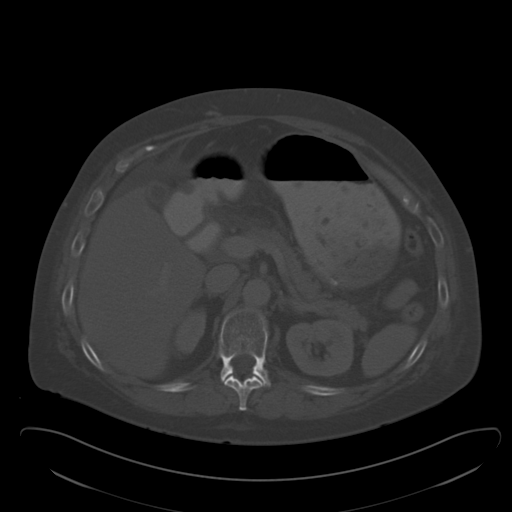
[im 83/104  lung]
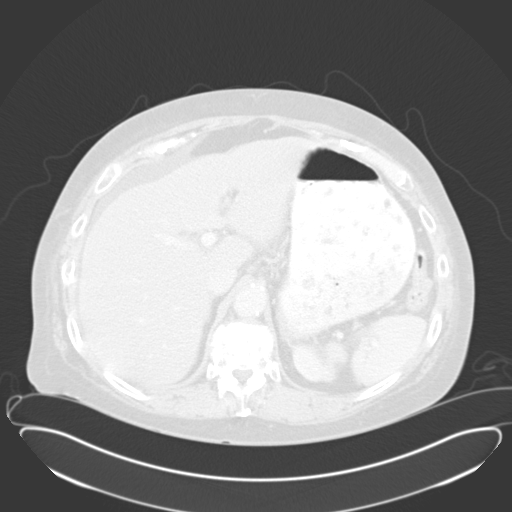
[im 88/104  soft-tissue]
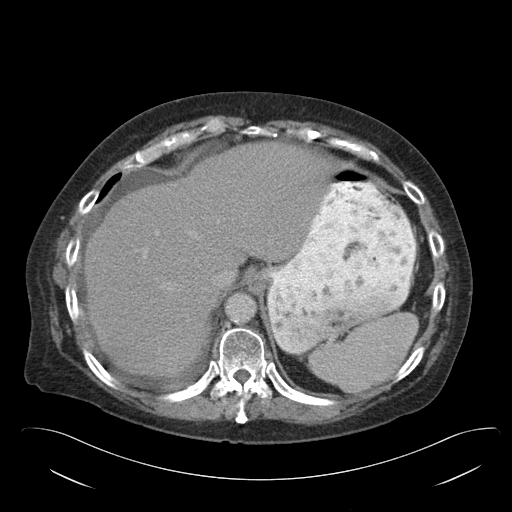
[im 88/104  lung]
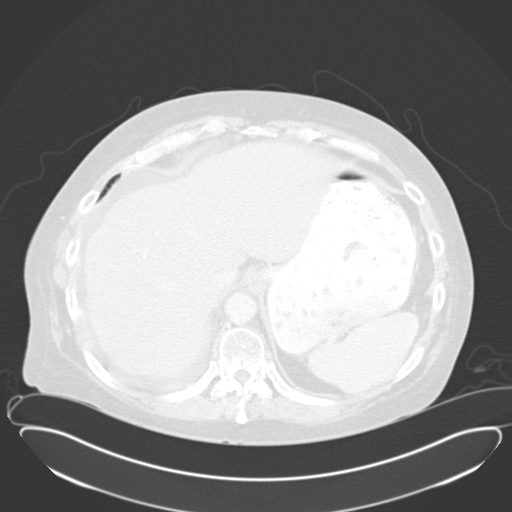
[im 93/104  lung]
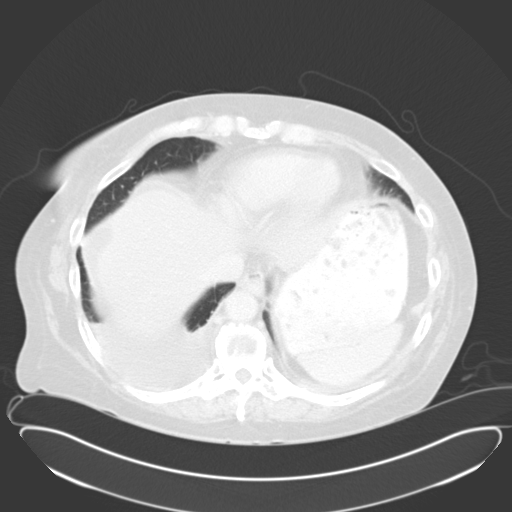
[im 98/104  soft-tissue]
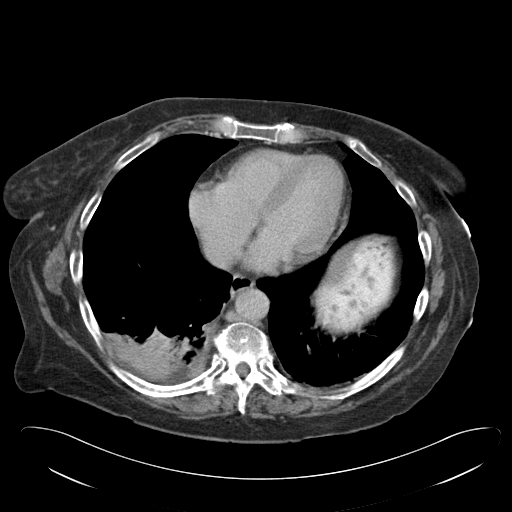
[im 98/104  lung]
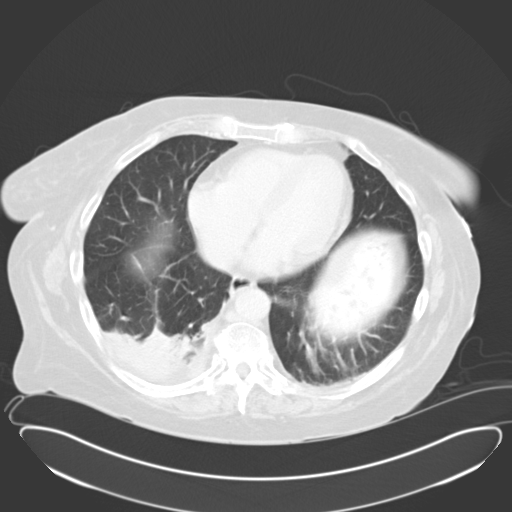

[13 of 32 positions shown; findings below may reference images not displayed]

FINDINGS: New mildly lobulated, partially loculated appearing small right
pleural effusion. Adjacent right lower lobe consolidation with air
bronchograms (series 5, image 6). Mild left costophrenic angle
opacity more resembles atelectasis. No lung base nodule identified.

No left pleural effusion or pericardial effusion.

Widespread osteopenia and lower thoracic compression fractures
appear stable since 3133. Chronic right mid sacral fracture. No
acute or suspicious osseous lesion identified.

No residual rectal or anal verge soft tissue mass. Presacral
stranding may reflect sequelae of radiation. Decompressed distal
colon. Unremarkable uterus. Bilateral tubal ligation clips.
Distended but otherwise negative urinary bladder.

New moderate volume pelvic ascites. There is a left abdominal ostomy
with parastomal hernia measuring about 10 cm diameter containing
both proximal and distal large bowel as well as associated colonic
mesentery and free fluid. Upstream descending colon however is
decompressed. Transverse colon has a more normal appearance with
retained gas and stool. The hepatic flexure and right colon also
contain gas and stool but appear indistinct with mild wall
thickening. Negative appendix. Mildly dilated terminal ileum and
distal small bowel loops containing air in fluid. Oral contrast
distends the stomach and duodenum. Proximal jejunum is decompressed
but there are oral contrast containing dilated left abdominal small
bowel loops measuring up to 3.5 cm diameter. There does appear to be
a somewhat gradual transition to decompressed loops anterior to the
upper sacrum on series 2, image 64 (also coronal image 72).

No abdominal free air. Small volume ascites, primarily in the right
abdomen, is new.

No liver mass identified. Decompressed gallbladder. Spleen, pancreas
and adrenal glands are within normal limits. Bilateral renal
enhancement and contrast excretion within normal limits.

Portal venous system is patent. Aortoiliac calcified atherosclerosis
noted. Chronic infrarenal abdominal aortic aneurysm measuring up to
42 mm diameter does appear mildly increased from 37-38 mm in 3133.
Associated calcified atherosclerosis which continues into the
bilateral pelvic and femoral arteries. Major arterial structures
remain patent.

No lymphadenopathy identified in the abdomen or pelvis.
IMPRESSION: 1. Dilated small bowel suggesting ileus or mechanical obstruction.
There is a somewhat gradual transition to decompressed small bowel
loops along the anterior pelvic inlet. Favor postoperative
adhesions.
2. Left lower quadrant ostomy with 10 cm parastomal hernia
containing bowel, mesenteric, and ascites, however, this does not
appear related to the obstruction.
3. New small volume abdominal and moderate volume pelvic ascites.
Indistinct appearance of the right colon could be reactive, but
superimposed colitis would be difficult to exclude.
4. New small loculated right pleural effusion with right lower lobe
pneumonia.
5. Chronic but increased infrarenal abdominal aortic aneurysm, with
up to 5 mm of growth in less than 12 months may indicate an
increased risk of aneurysm rupture. Recommend vascular surgery
referral/consultation if not already obtained.
6. No metastatic disease identified in the abdomen or pelvis.
7. Chronic osteopenia and widespread thoracic compression fractures.

## 2017-08-28 IMAGING — DX DG CHEST 1V PORT
1 series · 1 of 1 positions shown · non-contrast
Comparison: 03/15/2015

CLINICAL DATA: Shortness of Breath

EXAM:
PORTABLE CHEST 1 VIEW

[chest ap]
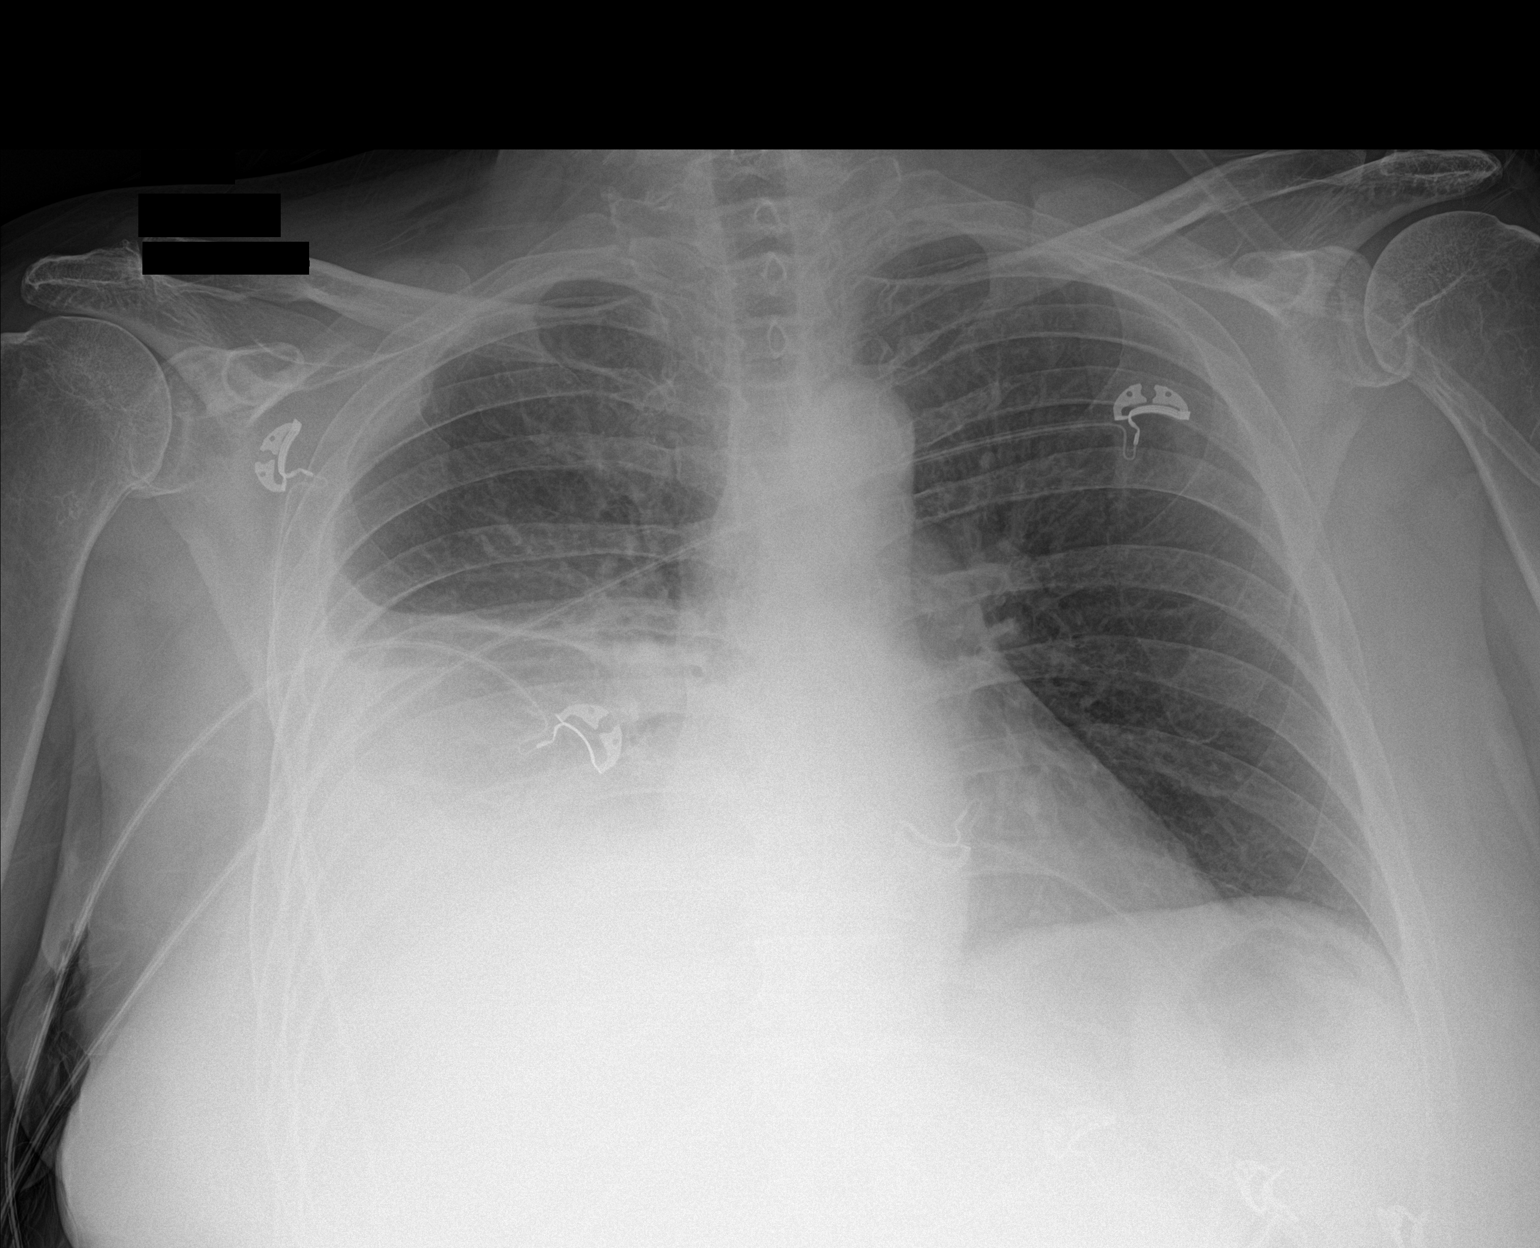

[1 of 1 positions shown; findings below may reference images not displayed]

FINDINGS: Cardiac shadow is stable. The left lung remains clear. Increasing
right-sided pleural effusion and likely underlying infiltrate is
noted. No pneumothorax is seen. No bony abnormality is noted.
IMPRESSION: Increasing right-sided pleural effusion.

## 2017-08-28 IMAGING — US US THORACENTESIS ASP PLEURAL SPACE W/IMG GUIDE
1 series · 4 of 4 positions shown · non-contrast
Comparison: none

INDICATION: Right pleural effusion.

[Series 1: us thoracentesis asp pleural space w/img guide · 0.25mm/px · 4 of 4 slices shown]
[im 1/4]
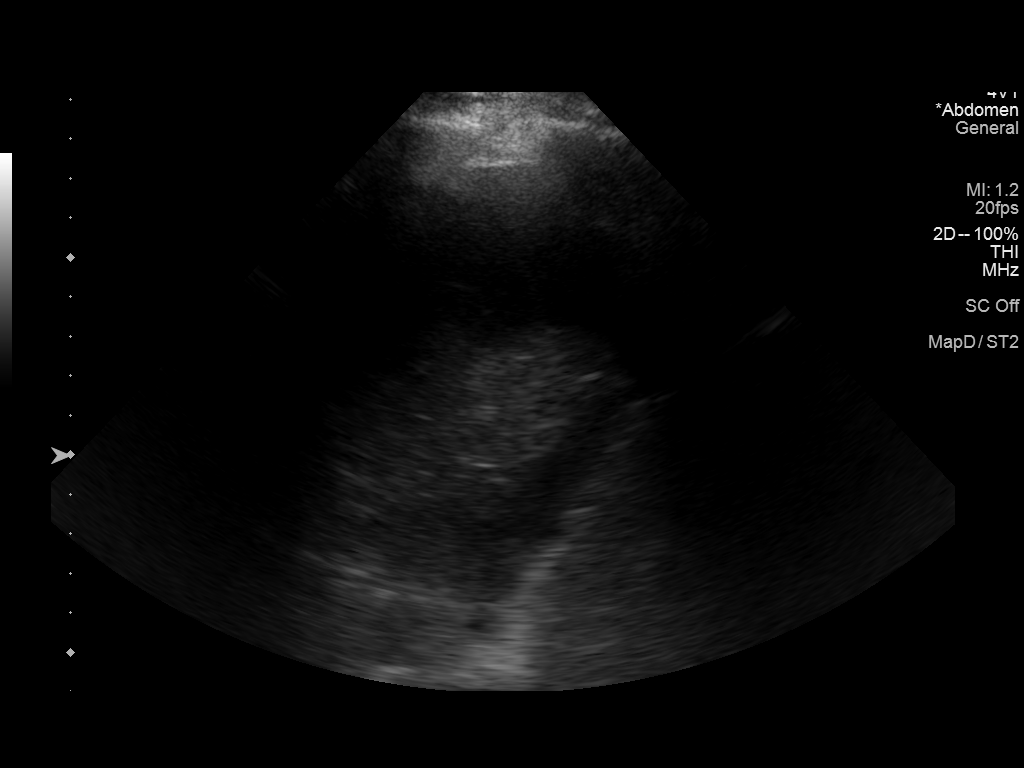
[im 2/4]
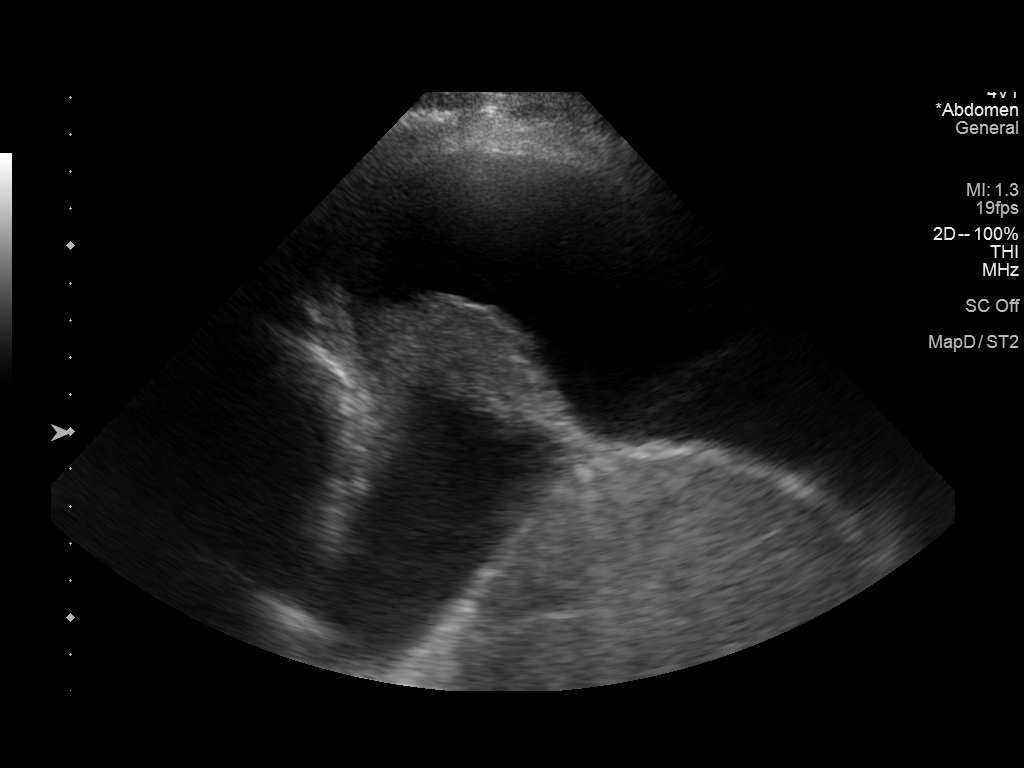
[im 3/4]
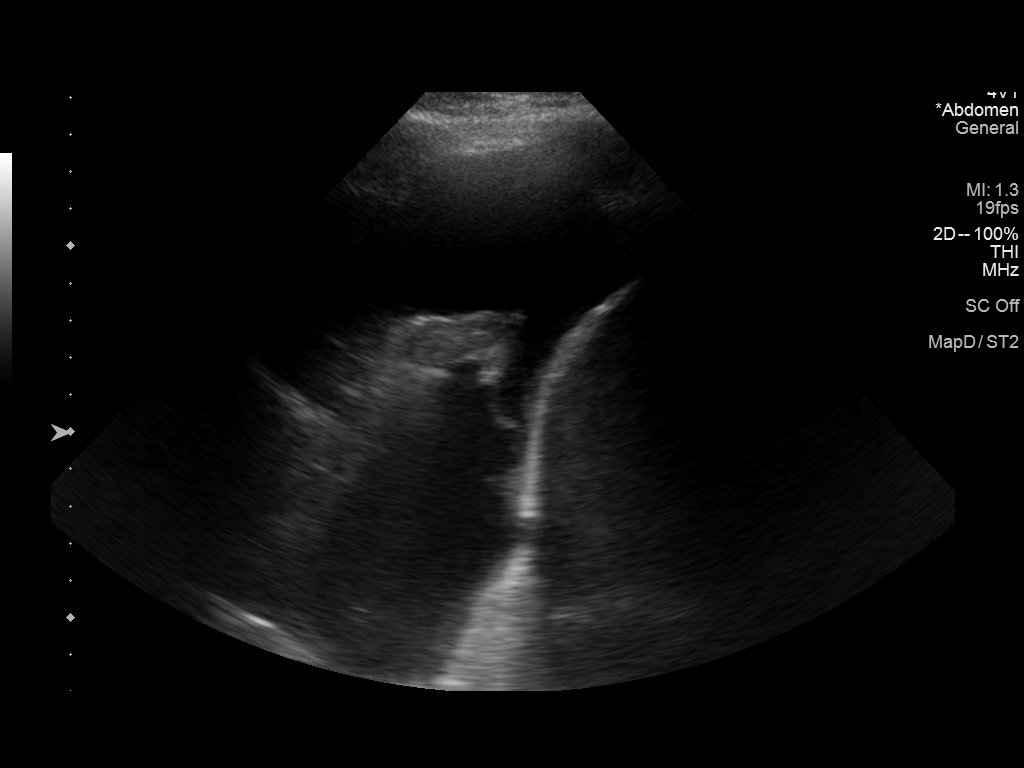
[im 4/4]
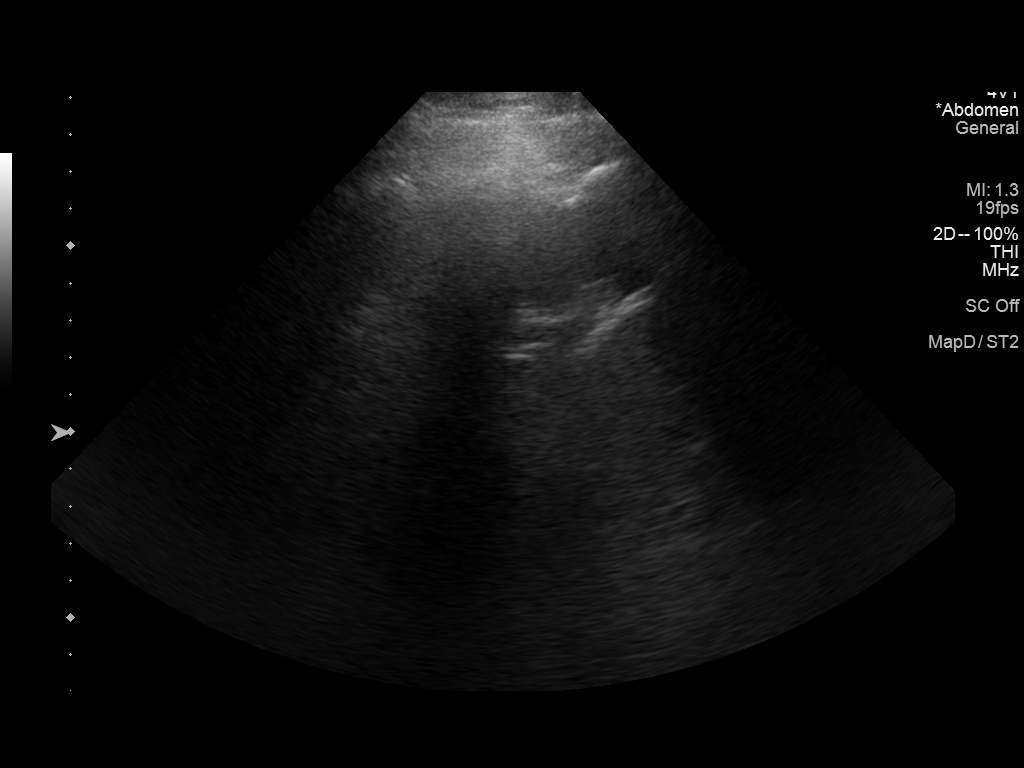

[4 of 4 positions shown; findings below may reference images not displayed]

EXAM:
ULTRASOUND GUIDED RIGHT THORACENTESIS

MEDICATIONS:
None.

COMPLICATIONS:
None immediate.

PROCEDURE:
An ultrasound guided thoracentesis was thoroughly discussed with the
patient and questions answered. The benefits, risks, alternatives
and complications were also discussed. The patient understands and
wishes to proceed with the procedure. Written consent was obtained.

Ultrasound was performed to localize and mark an adequate pocket of
fluid in the right posterior chest. The area was then prepped and
draped in the normal sterile fashion. 1% Lidocaine was used for
local anesthesia. Under ultrasound guidance a Safe-T-Centesis
catheter catheter was introduced. Thoracentesis was performed. The
catheter was removed and a dressing applied.
FINDINGS: A total of approximately 1.6 L of bloody fluid was removed. Samples
were sent to the laboratory as requested by the clinical team.
IMPRESSION: Successful ultrasound guided right thoracentesis yielding 1.6 L of
pleural fluid.

## 2017-10-30 IMAGING — CR DG CHEST 2V
2 series · 2 of 2 positions shown · non-contrast
Comparison: Plain film 04/17/2015, 04/15/2015, PET-CT 05/03/2015

CLINICAL DATA: 60-year-old female with a history of shortness of
breath. Initiating chemotherapy for new lung cancer.

EXAM:
CHEST  2 VIEW

[chest pa]
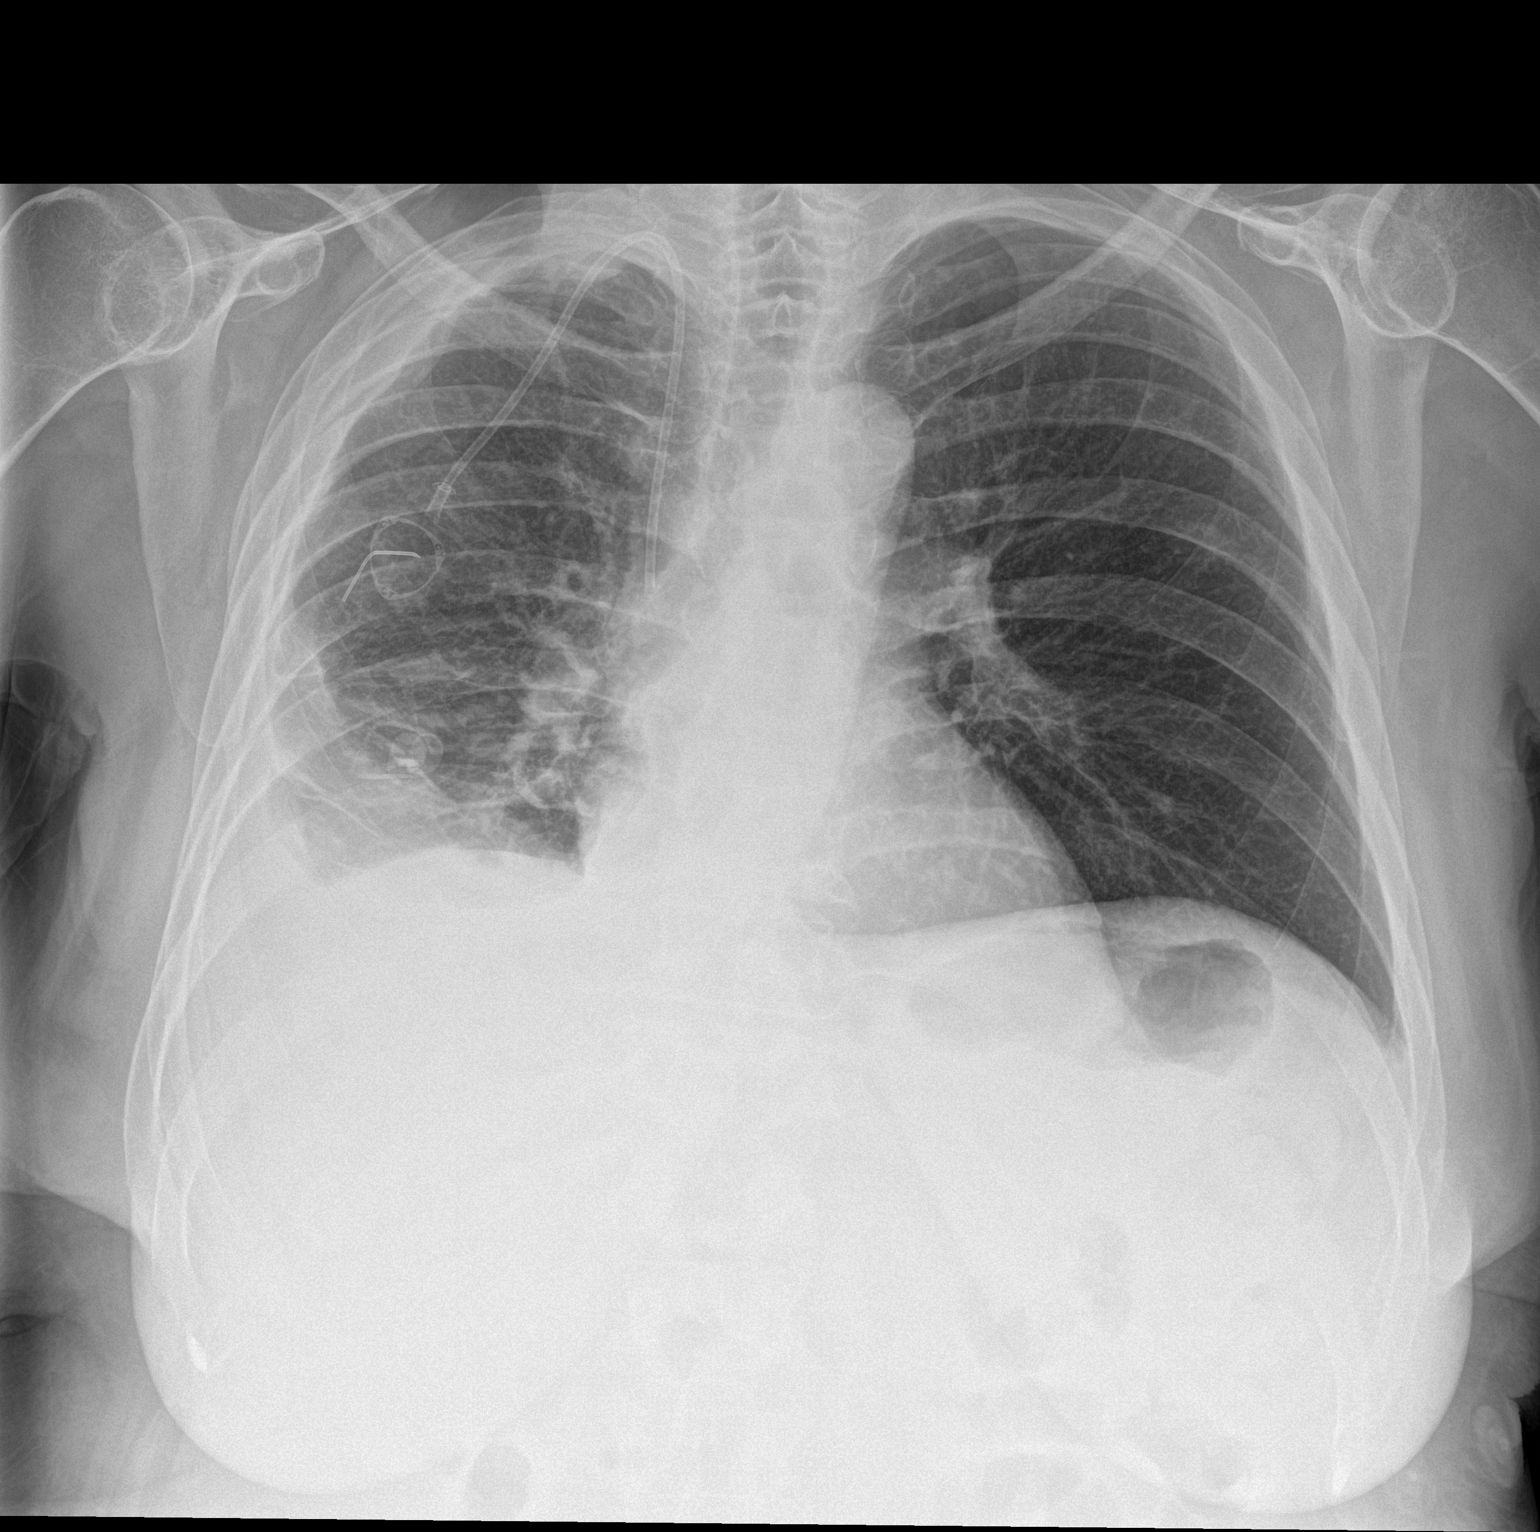

[chest lat]
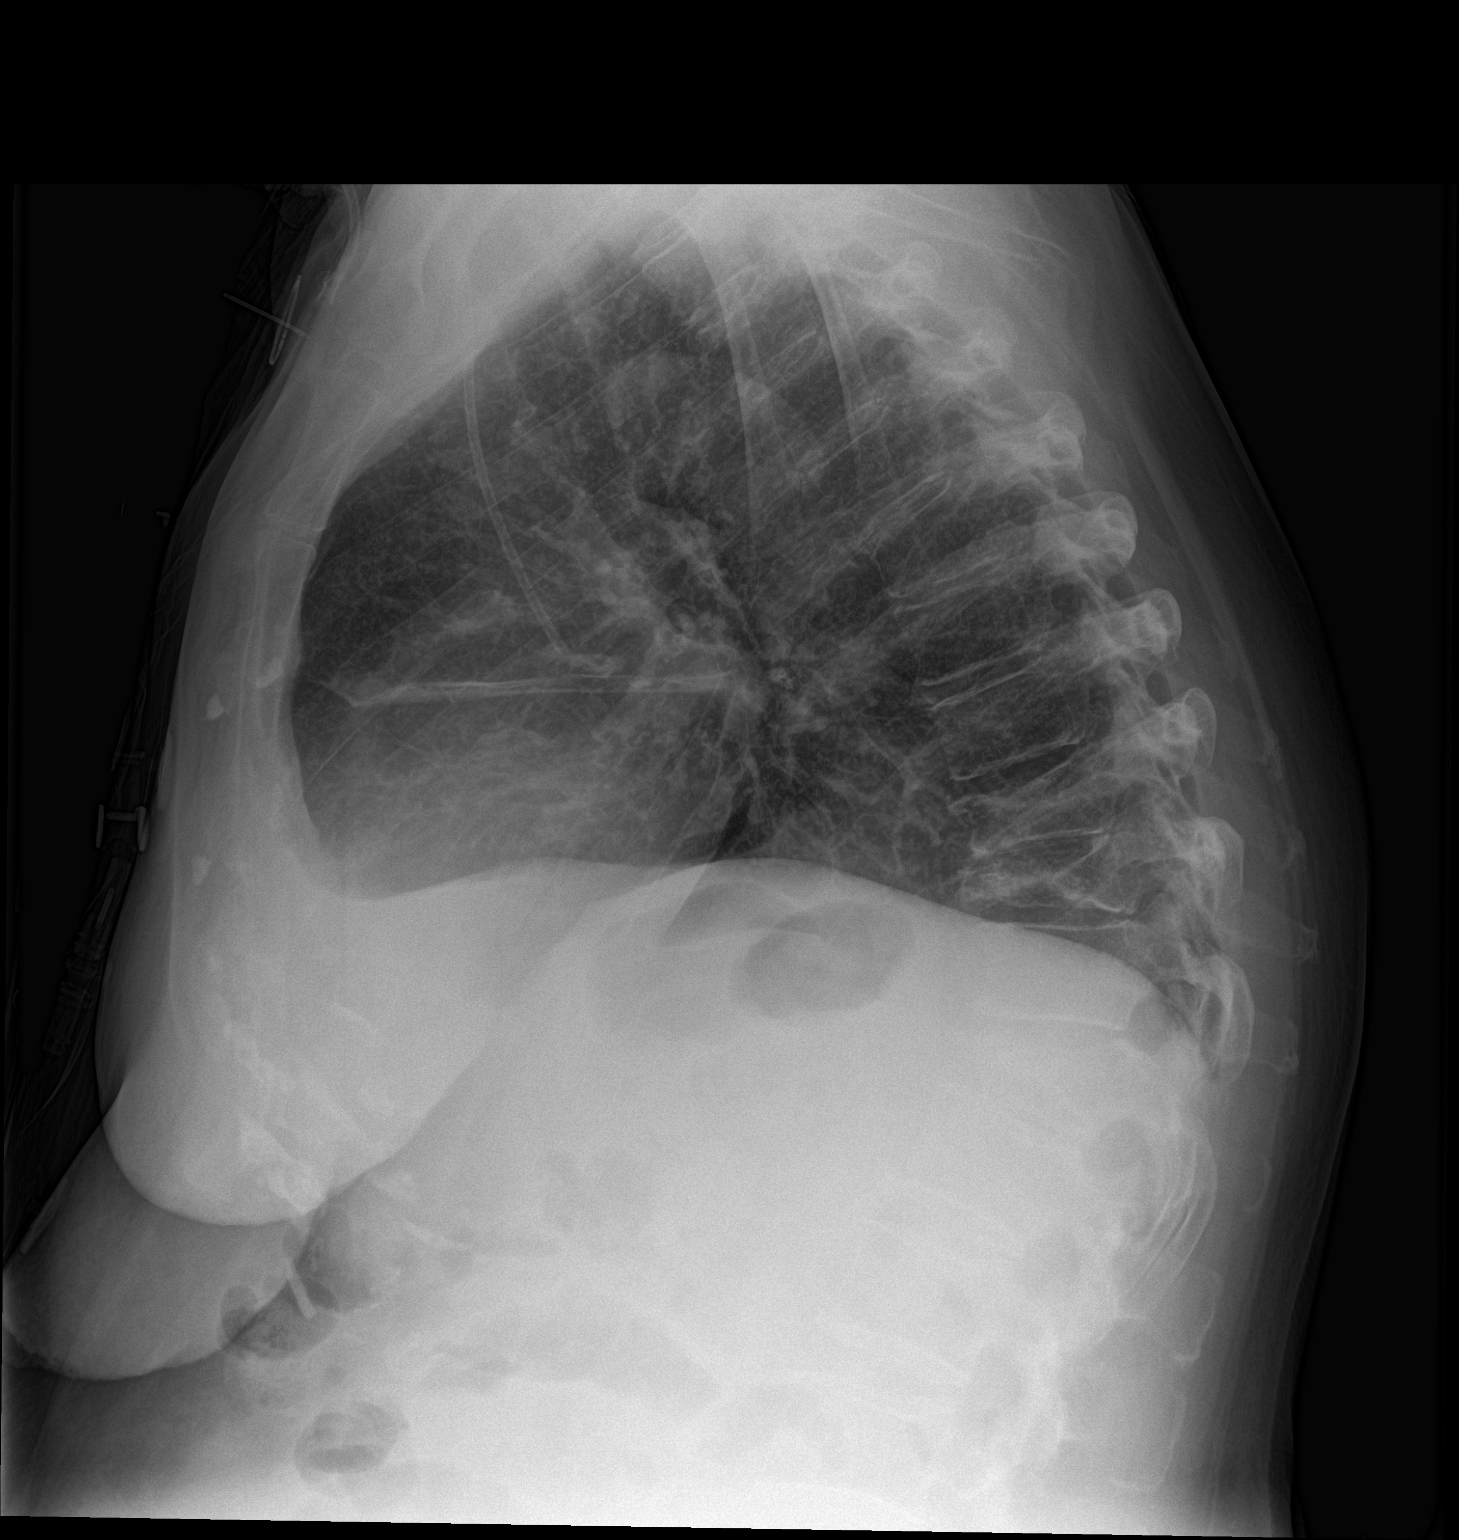

[2 of 2 positions shown; findings below may reference images not displayed]

FINDINGS: Cardiomediastinal silhouette unchanged.

Blunting of the right costophrenic angle with partial obscuration
the right hemidiaphragm, similar to comparison. Pleural parenchymal
thickening along the right chest/ lung interface.

Interval placement of right IJ approach port catheter, with the tip
appearing to terminate at the superior vena cava.

No left-sided airspace disease.

Contiguous lower thoracic compression fractures of T9 and T10, as
well as T12, unchanged in configuration from comparison studies.

No new acute fracture.
IMPRESSION: Similar appearance of right-sided pleural effusion with associated
consolidation/atelectasis, in this patient with known malignant
pleural disease.

No new left-sided airspace disease.

Interval placement of right port catheter via IJ approach.

Unchanged configuration of lower thoracic compression fractures.

## 2018-05-25 ENCOUNTER — Other Ambulatory Visit: Payer: Self-pay
